# Patient Record
Sex: Female | Born: 1950 | Race: Black or African American | Hispanic: No | Marital: Single | Smoking: Former smoker
Health system: Southern US, Community
[De-identification: ages and names within clinical notes are randomized; demographics above are authoritative.]

## PROBLEM LIST (undated history)

## (undated) DIAGNOSIS — D638 Anemia in other chronic diseases classified elsewhere: Secondary | ICD-10-CM

## (undated) DIAGNOSIS — K5903 Drug induced constipation: Secondary | ICD-10-CM

## (undated) DIAGNOSIS — R748 Abnormal levels of other serum enzymes: Secondary | ICD-10-CM

## (undated) DIAGNOSIS — E785 Hyperlipidemia, unspecified: Secondary | ICD-10-CM

## (undated) DIAGNOSIS — T402X5A Adverse effect of other opioids, initial encounter: Principal | ICD-10-CM

## (undated) DIAGNOSIS — R011 Cardiac murmur, unspecified: Secondary | ICD-10-CM

## (undated) DIAGNOSIS — I5189 Other ill-defined heart diseases: Secondary | ICD-10-CM

## (undated) DIAGNOSIS — K649 Unspecified hemorrhoids: Secondary | ICD-10-CM

## (undated) DIAGNOSIS — Z87442 Personal history of urinary calculi: Secondary | ICD-10-CM

## (undated) DIAGNOSIS — M791 Myalgia, unspecified site: Secondary | ICD-10-CM

## (undated) DIAGNOSIS — I10 Essential (primary) hypertension: Secondary | ICD-10-CM

## (undated) DIAGNOSIS — K635 Polyp of colon: Secondary | ICD-10-CM

## (undated) DIAGNOSIS — K861 Other chronic pancreatitis: Secondary | ICD-10-CM

## (undated) DIAGNOSIS — R220 Localized swelling, mass and lump, head: Secondary | ICD-10-CM

## (undated) DIAGNOSIS — E1139 Type 2 diabetes mellitus with other diabetic ophthalmic complication: Secondary | ICD-10-CM

## (undated) DIAGNOSIS — I6529 Occlusion and stenosis of unspecified carotid artery: Secondary | ICD-10-CM

## (undated) DIAGNOSIS — K7460 Unspecified cirrhosis of liver: Secondary | ICD-10-CM

## (undated) DIAGNOSIS — E1149 Type 2 diabetes mellitus with other diabetic neurological complication: Secondary | ICD-10-CM

## (undated) DIAGNOSIS — J189 Pneumonia, unspecified organism: Secondary | ICD-10-CM

## (undated) DIAGNOSIS — J358 Other chronic diseases of tonsils and adenoids: Secondary | ICD-10-CM

## (undated) DIAGNOSIS — N189 Chronic kidney disease, unspecified: Secondary | ICD-10-CM

## (undated) DIAGNOSIS — I739 Peripheral vascular disease, unspecified: Secondary | ICD-10-CM

## (undated) DIAGNOSIS — I351 Nonrheumatic aortic (valve) insufficiency: Secondary | ICD-10-CM

## (undated) DIAGNOSIS — M1990 Unspecified osteoarthritis, unspecified site: Secondary | ICD-10-CM

## (undated) HISTORY — DX: Nonrheumatic aortic (valve) insufficiency: I35.1

## (undated) HISTORY — PX: MOUTH SURGERY: SHX715

## (undated) HISTORY — DX: Drug induced constipation: K59.03

## (undated) HISTORY — DX: Unspecified osteoarthritis, unspecified site: M19.90

## (undated) HISTORY — DX: Chronic kidney disease, unspecified: N18.9

## (undated) HISTORY — DX: Unspecified hemorrhoids: K64.9

## (undated) HISTORY — DX: Adverse effect of other opioids, initial encounter: T40.2X5A

## (undated) HISTORY — DX: Type 2 diabetes mellitus with other diabetic neurological complication: E11.49

## (undated) HISTORY — DX: Hyperlipidemia, unspecified: E78.5

## (undated) HISTORY — DX: Peripheral vascular disease, unspecified: I73.9

## (undated) HISTORY — DX: Occlusion and stenosis of unspecified carotid artery: I65.29

## (undated) HISTORY — DX: Type 2 diabetes mellitus with other diabetic ophthalmic complication: E11.39

## (undated) HISTORY — PX: COLONOSCOPY: SHX174

## (undated) HISTORY — PX: BREAST SURGERY: SHX581

## (undated) HISTORY — DX: Anemia in other chronic diseases classified elsewhere: D63.8

## (undated) HISTORY — DX: Myalgia, unspecified site: M79.10

## (undated) HISTORY — DX: Other chronic pancreatitis: K86.1

## (undated) HISTORY — DX: Polyp of colon: K63.5

## (undated) HISTORY — DX: Other ill-defined heart diseases: I51.89

## (undated) HISTORY — DX: Abnormal levels of other serum enzymes: R74.8

---

## 1999-06-24 ENCOUNTER — Emergency Department (HOSPITAL_COMMUNITY): Admission: EM | Admit: 1999-06-24 | Discharge: 1999-06-24 | Payer: Self-pay | Admitting: Emergency Medicine

## 1999-06-24 ENCOUNTER — Encounter: Payer: Self-pay | Admitting: Emergency Medicine

## 2000-01-17 ENCOUNTER — Inpatient Hospital Stay (HOSPITAL_COMMUNITY): Admission: EM | Admit: 2000-01-17 | Discharge: 2000-01-23 | Payer: Self-pay | Admitting: Emergency Medicine

## 2000-01-17 ENCOUNTER — Encounter: Payer: Self-pay | Admitting: Internal Medicine

## 2000-10-05 ENCOUNTER — Emergency Department (HOSPITAL_COMMUNITY): Admission: EM | Admit: 2000-10-05 | Discharge: 2000-10-05 | Payer: Self-pay | Admitting: Emergency Medicine

## 2000-10-14 ENCOUNTER — Encounter: Admission: RE | Admit: 2000-10-14 | Discharge: 2000-10-14 | Payer: Self-pay | Admitting: Internal Medicine

## 2000-12-03 ENCOUNTER — Encounter: Admission: RE | Admit: 2000-12-03 | Discharge: 2000-12-03 | Payer: Self-pay

## 2000-12-17 ENCOUNTER — Encounter: Admission: RE | Admit: 2000-12-17 | Discharge: 2000-12-17 | Payer: Self-pay | Admitting: Internal Medicine

## 2000-12-30 ENCOUNTER — Encounter: Admission: RE | Admit: 2000-12-30 | Discharge: 2000-12-30 | Payer: Self-pay | Admitting: Internal Medicine

## 2001-03-04 ENCOUNTER — Encounter: Admission: RE | Admit: 2001-03-04 | Discharge: 2001-03-04 | Payer: Self-pay | Admitting: *Deleted

## 2001-03-30 ENCOUNTER — Emergency Department (HOSPITAL_COMMUNITY): Admission: EM | Admit: 2001-03-30 | Discharge: 2001-03-30 | Payer: Self-pay | Admitting: Emergency Medicine

## 2001-03-30 ENCOUNTER — Encounter: Payer: Self-pay | Admitting: Emergency Medicine

## 2001-06-14 ENCOUNTER — Encounter: Admission: RE | Admit: 2001-06-14 | Discharge: 2001-06-14 | Payer: Self-pay | Admitting: Internal Medicine

## 2001-06-21 ENCOUNTER — Encounter: Admission: RE | Admit: 2001-06-21 | Discharge: 2001-06-21 | Payer: Self-pay

## 2001-10-03 ENCOUNTER — Encounter: Admission: RE | Admit: 2001-10-03 | Discharge: 2001-10-03 | Payer: Self-pay | Admitting: Internal Medicine

## 2001-10-10 ENCOUNTER — Encounter: Admission: RE | Admit: 2001-10-10 | Discharge: 2001-10-10 | Payer: Self-pay | Admitting: Internal Medicine

## 2001-10-18 ENCOUNTER — Encounter: Admission: RE | Admit: 2001-10-18 | Discharge: 2002-01-16 | Payer: Self-pay | Admitting: Internal Medicine

## 2001-11-28 ENCOUNTER — Encounter: Admission: RE | Admit: 2001-11-28 | Discharge: 2001-11-28 | Payer: Self-pay | Admitting: Internal Medicine

## 2001-12-12 ENCOUNTER — Encounter: Payer: Self-pay | Admitting: Internal Medicine

## 2001-12-12 ENCOUNTER — Ambulatory Visit (HOSPITAL_COMMUNITY): Admission: RE | Admit: 2001-12-12 | Discharge: 2001-12-12 | Payer: Self-pay | Admitting: Internal Medicine

## 2002-01-02 ENCOUNTER — Encounter: Admission: RE | Admit: 2002-01-02 | Discharge: 2002-01-02 | Payer: Self-pay | Admitting: Internal Medicine

## 2002-01-24 ENCOUNTER — Encounter: Payer: Self-pay | Admitting: Ophthalmology

## 2002-01-26 ENCOUNTER — Ambulatory Visit (HOSPITAL_COMMUNITY): Admission: RE | Admit: 2002-01-26 | Discharge: 2002-01-26 | Payer: Self-pay | Admitting: Ophthalmology

## 2002-03-09 ENCOUNTER — Encounter: Admission: RE | Admit: 2002-03-09 | Discharge: 2002-03-09 | Payer: Self-pay | Admitting: Internal Medicine

## 2002-04-03 ENCOUNTER — Ambulatory Visit (HOSPITAL_COMMUNITY): Admission: RE | Admit: 2002-04-03 | Discharge: 2002-04-03 | Payer: Self-pay | Admitting: Ophthalmology

## 2002-05-19 ENCOUNTER — Encounter: Payer: Self-pay | Admitting: Emergency Medicine

## 2002-05-19 ENCOUNTER — Inpatient Hospital Stay (HOSPITAL_COMMUNITY): Admission: EM | Admit: 2002-05-19 | Discharge: 2002-05-21 | Payer: Self-pay | Admitting: Emergency Medicine

## 2002-05-19 ENCOUNTER — Encounter: Admission: RE | Admit: 2002-05-19 | Discharge: 2002-05-19 | Payer: Self-pay | Admitting: Internal Medicine

## 2002-05-24 ENCOUNTER — Encounter: Admission: RE | Admit: 2002-05-24 | Discharge: 2002-05-24 | Payer: Self-pay | Admitting: Internal Medicine

## 2002-06-06 ENCOUNTER — Encounter (HOSPITAL_BASED_OUTPATIENT_CLINIC_OR_DEPARTMENT_OTHER): Admission: RE | Admit: 2002-06-06 | Discharge: 2002-08-22 | Payer: Self-pay | Admitting: Internal Medicine

## 2002-07-17 ENCOUNTER — Encounter: Admission: RE | Admit: 2002-07-17 | Discharge: 2002-07-17 | Payer: Self-pay | Admitting: Internal Medicine

## 2002-07-18 ENCOUNTER — Encounter: Admission: RE | Admit: 2002-07-18 | Discharge: 2002-07-18 | Payer: Self-pay | Admitting: Internal Medicine

## 2002-07-20 ENCOUNTER — Ambulatory Visit (HOSPITAL_COMMUNITY): Admission: RE | Admit: 2002-07-20 | Discharge: 2002-07-20 | Payer: Self-pay | Admitting: Internal Medicine

## 2002-08-01 ENCOUNTER — Encounter: Admission: RE | Admit: 2002-08-01 | Discharge: 2002-08-01 | Payer: Self-pay | Admitting: Internal Medicine

## 2002-08-10 ENCOUNTER — Ambulatory Visit (HOSPITAL_COMMUNITY): Admission: RE | Admit: 2002-08-10 | Discharge: 2002-08-10 | Payer: Self-pay | Admitting: Internal Medicine

## 2002-08-16 ENCOUNTER — Encounter: Admission: RE | Admit: 2002-08-16 | Discharge: 2002-08-16 | Payer: Self-pay | Admitting: Internal Medicine

## 2002-08-21 ENCOUNTER — Ambulatory Visit (HOSPITAL_COMMUNITY): Admission: RE | Admit: 2002-08-21 | Discharge: 2002-08-22 | Payer: Self-pay | Admitting: Ophthalmology

## 2002-09-28 ENCOUNTER — Encounter (HOSPITAL_BASED_OUTPATIENT_CLINIC_OR_DEPARTMENT_OTHER): Admission: RE | Admit: 2002-09-28 | Discharge: 2002-12-27 | Payer: Self-pay | Admitting: Internal Medicine

## 2002-10-06 ENCOUNTER — Encounter: Admission: RE | Admit: 2002-10-06 | Discharge: 2002-10-06 | Payer: Self-pay | Admitting: Internal Medicine

## 2003-01-04 ENCOUNTER — Encounter (HOSPITAL_BASED_OUTPATIENT_CLINIC_OR_DEPARTMENT_OTHER): Admission: RE | Admit: 2003-01-04 | Discharge: 2003-04-04 | Payer: Self-pay | Admitting: Internal Medicine

## 2003-01-12 ENCOUNTER — Emergency Department (HOSPITAL_COMMUNITY): Admission: EM | Admit: 2003-01-12 | Discharge: 2003-01-12 | Payer: Self-pay | Admitting: Emergency Medicine

## 2003-01-12 ENCOUNTER — Encounter: Admission: RE | Admit: 2003-01-12 | Discharge: 2003-01-12 | Payer: Self-pay | Admitting: Internal Medicine

## 2003-04-13 ENCOUNTER — Encounter (HOSPITAL_BASED_OUTPATIENT_CLINIC_OR_DEPARTMENT_OTHER): Admission: RE | Admit: 2003-04-13 | Discharge: 2003-07-12 | Payer: Self-pay | Admitting: Internal Medicine

## 2003-04-26 ENCOUNTER — Emergency Department (HOSPITAL_COMMUNITY): Admission: EM | Admit: 2003-04-26 | Discharge: 2003-04-27 | Payer: Self-pay | Admitting: Emergency Medicine

## 2003-05-28 ENCOUNTER — Encounter: Admission: RE | Admit: 2003-05-28 | Discharge: 2003-05-28 | Payer: Self-pay | Admitting: Internal Medicine

## 2003-08-10 ENCOUNTER — Encounter: Admission: RE | Admit: 2003-08-10 | Discharge: 2003-11-08 | Payer: Self-pay | Admitting: Internal Medicine

## 2003-08-29 ENCOUNTER — Encounter: Admission: RE | Admit: 2003-08-29 | Discharge: 2003-08-29 | Payer: Self-pay | Admitting: Internal Medicine

## 2003-09-12 ENCOUNTER — Encounter: Admission: RE | Admit: 2003-09-12 | Discharge: 2003-09-12 | Payer: Self-pay | Admitting: Internal Medicine

## 2003-10-19 ENCOUNTER — Ambulatory Visit (HOSPITAL_BASED_OUTPATIENT_CLINIC_OR_DEPARTMENT_OTHER): Admission: RE | Admit: 2003-10-19 | Discharge: 2003-10-19 | Payer: Self-pay | Admitting: Surgery

## 2003-10-19 ENCOUNTER — Ambulatory Visit (HOSPITAL_COMMUNITY): Admission: RE | Admit: 2003-10-19 | Discharge: 2003-10-19 | Payer: Self-pay | Admitting: Surgery

## 2003-10-19 ENCOUNTER — Encounter (INDEPENDENT_AMBULATORY_CARE_PROVIDER_SITE_OTHER): Payer: Self-pay | Admitting: *Deleted

## 2003-11-05 ENCOUNTER — Ambulatory Visit (HOSPITAL_COMMUNITY): Admission: RE | Admit: 2003-11-05 | Discharge: 2003-11-05 | Payer: Self-pay | Admitting: Internal Medicine

## 2003-11-05 ENCOUNTER — Ambulatory Visit: Payer: Self-pay | Admitting: Internal Medicine

## 2003-11-20 ENCOUNTER — Encounter (HOSPITAL_BASED_OUTPATIENT_CLINIC_OR_DEPARTMENT_OTHER): Admission: RE | Admit: 2003-11-20 | Discharge: 2004-02-15 | Payer: Self-pay | Admitting: Internal Medicine

## 2004-01-08 ENCOUNTER — Ambulatory Visit: Payer: Self-pay | Admitting: Internal Medicine

## 2004-01-31 ENCOUNTER — Ambulatory Visit: Payer: Self-pay | Admitting: Internal Medicine

## 2004-02-27 ENCOUNTER — Encounter (HOSPITAL_BASED_OUTPATIENT_CLINIC_OR_DEPARTMENT_OTHER): Admission: RE | Admit: 2004-02-27 | Discharge: 2004-05-20 | Payer: Self-pay | Admitting: Internal Medicine

## 2004-04-14 ENCOUNTER — Encounter (INDEPENDENT_AMBULATORY_CARE_PROVIDER_SITE_OTHER): Payer: Self-pay | Admitting: Internal Medicine

## 2004-05-19 ENCOUNTER — Ambulatory Visit: Payer: Self-pay | Admitting: Internal Medicine

## 2004-05-21 ENCOUNTER — Ambulatory Visit: Payer: Self-pay | Admitting: Internal Medicine

## 2004-05-28 ENCOUNTER — Encounter (HOSPITAL_BASED_OUTPATIENT_CLINIC_OR_DEPARTMENT_OTHER): Admission: RE | Admit: 2004-05-28 | Discharge: 2004-08-20 | Payer: Self-pay | Admitting: Surgery

## 2004-06-04 ENCOUNTER — Ambulatory Visit: Payer: Self-pay | Admitting: Internal Medicine

## 2004-08-06 ENCOUNTER — Ambulatory Visit: Payer: Self-pay | Admitting: Internal Medicine

## 2004-08-08 ENCOUNTER — Ambulatory Visit: Payer: Self-pay | Admitting: Internal Medicine

## 2004-08-14 ENCOUNTER — Ambulatory Visit: Payer: Self-pay | Admitting: Internal Medicine

## 2004-08-22 ENCOUNTER — Encounter (HOSPITAL_BASED_OUTPATIENT_CLINIC_OR_DEPARTMENT_OTHER): Admission: RE | Admit: 2004-08-22 | Discharge: 2004-11-20 | Payer: Self-pay | Admitting: Surgery

## 2004-08-27 ENCOUNTER — Ambulatory Visit: Admission: RE | Admit: 2004-08-27 | Discharge: 2004-08-27 | Payer: Self-pay | Admitting: Internal Medicine

## 2004-08-27 ENCOUNTER — Encounter (INDEPENDENT_AMBULATORY_CARE_PROVIDER_SITE_OTHER): Payer: Self-pay | Admitting: Internal Medicine

## 2004-08-27 ENCOUNTER — Ambulatory Visit: Payer: Self-pay | Admitting: Internal Medicine

## 2004-08-27 LAB — CONVERTED CEMR LAB: Pap Smear: NORMAL

## 2004-09-03 ENCOUNTER — Ambulatory Visit (HOSPITAL_COMMUNITY): Admission: RE | Admit: 2004-09-03 | Discharge: 2004-09-03 | Payer: Self-pay | Admitting: Internal Medicine

## 2004-09-03 ENCOUNTER — Encounter: Payer: Self-pay | Admitting: Cardiology

## 2004-09-03 ENCOUNTER — Ambulatory Visit: Payer: Self-pay | Admitting: Internal Medicine

## 2004-09-03 ENCOUNTER — Ambulatory Visit: Payer: Self-pay | Admitting: Cardiology

## 2004-09-11 ENCOUNTER — Ambulatory Visit: Payer: Self-pay | Admitting: Internal Medicine

## 2004-09-18 ENCOUNTER — Ambulatory Visit (HOSPITAL_COMMUNITY): Admission: RE | Admit: 2004-09-18 | Discharge: 2004-09-18 | Payer: Self-pay | Admitting: Internal Medicine

## 2004-09-25 ENCOUNTER — Ambulatory Visit: Payer: Self-pay | Admitting: Internal Medicine

## 2004-10-14 ENCOUNTER — Ambulatory Visit: Payer: Self-pay | Admitting: Internal Medicine

## 2004-11-25 ENCOUNTER — Ambulatory Visit: Payer: Self-pay | Admitting: Hospitalist

## 2004-12-12 ENCOUNTER — Encounter (INDEPENDENT_AMBULATORY_CARE_PROVIDER_SITE_OTHER): Payer: Self-pay | Admitting: Specialist

## 2004-12-12 ENCOUNTER — Ambulatory Visit (HOSPITAL_COMMUNITY): Admission: RE | Admit: 2004-12-12 | Discharge: 2004-12-12 | Payer: Self-pay | Admitting: Surgery

## 2004-12-13 ENCOUNTER — Emergency Department (HOSPITAL_COMMUNITY): Admission: EM | Admit: 2004-12-13 | Discharge: 2004-12-13 | Payer: Self-pay | Admitting: *Deleted

## 2004-12-17 ENCOUNTER — Encounter: Admission: RE | Admit: 2004-12-17 | Discharge: 2004-12-17 | Payer: Self-pay | Admitting: Ophthalmology

## 2005-01-24 ENCOUNTER — Emergency Department (HOSPITAL_COMMUNITY): Admission: EM | Admit: 2005-01-24 | Discharge: 2005-01-25 | Payer: Self-pay | Admitting: Emergency Medicine

## 2005-03-25 ENCOUNTER — Ambulatory Visit: Payer: Self-pay | Admitting: Internal Medicine

## 2005-04-27 ENCOUNTER — Ambulatory Visit: Payer: Self-pay | Admitting: Internal Medicine

## 2005-04-30 ENCOUNTER — Ambulatory Visit: Payer: Self-pay | Admitting: Internal Medicine

## 2005-05-04 ENCOUNTER — Ambulatory Visit: Payer: Self-pay | Admitting: Internal Medicine

## 2005-05-04 ENCOUNTER — Ambulatory Visit (HOSPITAL_COMMUNITY): Admission: RE | Admit: 2005-05-04 | Discharge: 2005-05-04 | Payer: Self-pay | Admitting: Internal Medicine

## 2005-05-08 ENCOUNTER — Ambulatory Visit (HOSPITAL_COMMUNITY): Admission: RE | Admit: 2005-05-08 | Discharge: 2005-05-08 | Payer: Self-pay | Admitting: Internal Medicine

## 2005-06-22 ENCOUNTER — Encounter: Admission: RE | Admit: 2005-06-22 | Discharge: 2005-06-22 | Payer: Self-pay | Admitting: Internal Medicine

## 2005-07-02 ENCOUNTER — Ambulatory Visit: Payer: Self-pay | Admitting: Internal Medicine

## 2005-07-02 ENCOUNTER — Ambulatory Visit (HOSPITAL_COMMUNITY): Admission: RE | Admit: 2005-07-02 | Discharge: 2005-07-02 | Payer: Self-pay | Admitting: Internal Medicine

## 2005-07-06 ENCOUNTER — Ambulatory Visit: Payer: Self-pay | Admitting: Internal Medicine

## 2005-07-07 ENCOUNTER — Ambulatory Visit (HOSPITAL_COMMUNITY): Admission: RE | Admit: 2005-07-07 | Discharge: 2005-07-07 | Payer: Self-pay | Admitting: Internal Medicine

## 2005-07-09 ENCOUNTER — Ambulatory Visit: Payer: Self-pay | Admitting: Internal Medicine

## 2005-07-16 ENCOUNTER — Ambulatory Visit: Payer: Self-pay | Admitting: Internal Medicine

## 2005-07-23 ENCOUNTER — Encounter (INDEPENDENT_AMBULATORY_CARE_PROVIDER_SITE_OTHER): Payer: Self-pay | Admitting: *Deleted

## 2005-07-23 ENCOUNTER — Ambulatory Visit (HOSPITAL_COMMUNITY): Admission: RE | Admit: 2005-07-23 | Discharge: 2005-07-23 | Payer: Self-pay | Admitting: Internal Medicine

## 2005-07-23 ENCOUNTER — Ambulatory Visit: Payer: Self-pay | Admitting: Internal Medicine

## 2005-08-12 ENCOUNTER — Ambulatory Visit: Payer: Self-pay | Admitting: Internal Medicine

## 2005-08-12 ENCOUNTER — Encounter (INDEPENDENT_AMBULATORY_CARE_PROVIDER_SITE_OTHER): Payer: Self-pay | Admitting: *Deleted

## 2005-08-13 ENCOUNTER — Ambulatory Visit: Payer: Self-pay | Admitting: Internal Medicine

## 2005-08-17 ENCOUNTER — Ambulatory Visit: Payer: Self-pay | Admitting: Internal Medicine

## 2005-08-20 ENCOUNTER — Ambulatory Visit: Payer: Self-pay | Admitting: Cardiology

## 2005-09-07 ENCOUNTER — Ambulatory Visit: Payer: Self-pay | Admitting: Internal Medicine

## 2005-09-07 ENCOUNTER — Encounter (INDEPENDENT_AMBULATORY_CARE_PROVIDER_SITE_OTHER): Payer: Self-pay | Admitting: *Deleted

## 2005-09-09 ENCOUNTER — Ambulatory Visit: Payer: Self-pay | Admitting: Internal Medicine

## 2005-09-09 ENCOUNTER — Ambulatory Visit (HOSPITAL_COMMUNITY): Admission: RE | Admit: 2005-09-09 | Discharge: 2005-09-09 | Payer: Self-pay | Admitting: Internal Medicine

## 2005-10-01 ENCOUNTER — Ambulatory Visit: Payer: Self-pay | Admitting: Internal Medicine

## 2005-10-12 ENCOUNTER — Ambulatory Visit: Payer: Self-pay | Admitting: Internal Medicine

## 2005-10-13 ENCOUNTER — Ambulatory Visit (HOSPITAL_COMMUNITY): Admission: RE | Admit: 2005-10-13 | Discharge: 2005-10-13 | Payer: Self-pay | Admitting: Internal Medicine

## 2005-12-28 ENCOUNTER — Encounter (INDEPENDENT_AMBULATORY_CARE_PROVIDER_SITE_OTHER): Payer: Self-pay | Admitting: Internal Medicine

## 2005-12-28 ENCOUNTER — Ambulatory Visit: Payer: Self-pay | Admitting: Hospitalist

## 2005-12-28 LAB — CONVERTED CEMR LAB
ALT: 102 units/L — ABNORMAL HIGH (ref 0–35)
AST: 72 units/L — ABNORMAL HIGH (ref 0–37)
Albumin: 3.7 g/dL (ref 3.5–5.2)
Alkaline Phosphatase: 164 units/L — ABNORMAL HIGH (ref 39–117)
BUN: 35 mg/dL — ABNORMAL HIGH (ref 6–23)
Calcium: 8.2 mg/dL — ABNORMAL LOW (ref 8.4–10.5)
Phosphorus: 4.3 mg/dL (ref 2.3–4.6)
Total Bilirubin: 0.2 mg/dL — ABNORMAL LOW (ref 0.3–1.2)

## 2006-02-22 ENCOUNTER — Encounter: Payer: Self-pay | Admitting: Vascular Surgery

## 2006-02-22 ENCOUNTER — Inpatient Hospital Stay (HOSPITAL_COMMUNITY): Admission: EM | Admit: 2006-02-22 | Discharge: 2006-02-24 | Payer: Self-pay | Admitting: Emergency Medicine

## 2006-02-22 ENCOUNTER — Ambulatory Visit: Payer: Self-pay | Admitting: *Deleted

## 2006-02-24 ENCOUNTER — Encounter: Payer: Self-pay | Admitting: Vascular Surgery

## 2006-03-04 ENCOUNTER — Inpatient Hospital Stay (HOSPITAL_COMMUNITY): Admission: AD | Admit: 2006-03-04 | Discharge: 2006-03-09 | Payer: Self-pay | Admitting: *Deleted

## 2006-03-04 ENCOUNTER — Ambulatory Visit: Payer: Self-pay | Admitting: Internal Medicine

## 2006-03-04 ENCOUNTER — Encounter: Admission: RE | Admit: 2006-03-04 | Discharge: 2006-03-04 | Payer: Self-pay | Admitting: Internal Medicine

## 2006-03-04 ENCOUNTER — Ambulatory Visit: Payer: Self-pay | Admitting: *Deleted

## 2006-03-04 ENCOUNTER — Ambulatory Visit (HOSPITAL_COMMUNITY): Admission: RE | Admit: 2006-03-04 | Discharge: 2006-03-04 | Payer: Self-pay | Admitting: Internal Medicine

## 2006-03-05 ENCOUNTER — Encounter: Payer: Self-pay | Admitting: Vascular Surgery

## 2006-03-09 DIAGNOSIS — D6859 Other primary thrombophilia: Secondary | ICD-10-CM | POA: Insufficient documentation

## 2006-03-12 ENCOUNTER — Encounter (INDEPENDENT_AMBULATORY_CARE_PROVIDER_SITE_OTHER): Payer: Self-pay | Admitting: Internal Medicine

## 2006-03-15 ENCOUNTER — Encounter (INDEPENDENT_AMBULATORY_CARE_PROVIDER_SITE_OTHER): Payer: Self-pay | Admitting: Internal Medicine

## 2006-03-15 DIAGNOSIS — D638 Anemia in other chronic diseases classified elsewhere: Secondary | ICD-10-CM

## 2006-03-15 DIAGNOSIS — E1139 Type 2 diabetes mellitus with other diabetic ophthalmic complication: Secondary | ICD-10-CM

## 2006-03-15 DIAGNOSIS — E1165 Type 2 diabetes mellitus with hyperglycemia: Secondary | ICD-10-CM

## 2006-03-15 DIAGNOSIS — N183 Chronic kidney disease, stage 3 (moderate): Secondary | ICD-10-CM

## 2006-03-15 DIAGNOSIS — E1122 Type 2 diabetes mellitus with diabetic chronic kidney disease: Secondary | ICD-10-CM | POA: Insufficient documentation

## 2006-03-15 DIAGNOSIS — I517 Cardiomegaly: Secondary | ICD-10-CM | POA: Insufficient documentation

## 2006-03-15 DIAGNOSIS — E1140 Type 2 diabetes mellitus with diabetic neuropathy, unspecified: Secondary | ICD-10-CM | POA: Insufficient documentation

## 2006-03-15 DIAGNOSIS — N189 Chronic kidney disease, unspecified: Secondary | ICD-10-CM

## 2006-03-15 DIAGNOSIS — E1149 Type 2 diabetes mellitus with other diabetic neurological complication: Secondary | ICD-10-CM

## 2006-03-15 DIAGNOSIS — I10 Essential (primary) hypertension: Secondary | ICD-10-CM | POA: Insufficient documentation

## 2006-03-15 HISTORY — DX: Anemia in other chronic diseases classified elsewhere: D63.8

## 2006-03-15 HISTORY — DX: Chronic kidney disease, unspecified: N18.9

## 2006-03-15 HISTORY — DX: Type 2 diabetes mellitus with other diabetic ophthalmic complication: E11.39

## 2006-03-15 HISTORY — DX: Type 2 diabetes mellitus with other diabetic neurological complication: E11.49

## 2006-03-23 ENCOUNTER — Telehealth (INDEPENDENT_AMBULATORY_CARE_PROVIDER_SITE_OTHER): Payer: Self-pay | Admitting: Internal Medicine

## 2006-03-29 ENCOUNTER — Ambulatory Visit: Payer: Self-pay | Admitting: Internal Medicine

## 2006-03-29 DIAGNOSIS — K861 Other chronic pancreatitis: Secondary | ICD-10-CM

## 2006-03-29 HISTORY — DX: Other chronic pancreatitis: K86.1

## 2006-04-06 ENCOUNTER — Encounter (INDEPENDENT_AMBULATORY_CARE_PROVIDER_SITE_OTHER): Payer: Self-pay | Admitting: Internal Medicine

## 2006-04-09 ENCOUNTER — Telehealth (INDEPENDENT_AMBULATORY_CARE_PROVIDER_SITE_OTHER): Payer: Self-pay | Admitting: Internal Medicine

## 2006-04-22 ENCOUNTER — Telehealth: Payer: Self-pay | Admitting: *Deleted

## 2006-04-26 ENCOUNTER — Encounter (INDEPENDENT_AMBULATORY_CARE_PROVIDER_SITE_OTHER): Payer: Self-pay | Admitting: Internal Medicine

## 2006-04-28 ENCOUNTER — Encounter (INDEPENDENT_AMBULATORY_CARE_PROVIDER_SITE_OTHER): Payer: Self-pay | Admitting: Pulmonary Disease

## 2006-06-29 ENCOUNTER — Encounter (INDEPENDENT_AMBULATORY_CARE_PROVIDER_SITE_OTHER): Payer: Self-pay | Admitting: *Deleted

## 2006-06-29 ENCOUNTER — Ambulatory Visit: Payer: Self-pay | Admitting: Internal Medicine

## 2006-06-29 DIAGNOSIS — Z8739 Personal history of other diseases of the musculoskeletal system and connective tissue: Secondary | ICD-10-CM | POA: Insufficient documentation

## 2006-06-29 LAB — CONVERTED CEMR LAB
CO2: 21 meq/L (ref 19–32)
Chloride: 113 meq/L — ABNORMAL HIGH (ref 96–112)
Glucose, Bld: 55 mg/dL — ABNORMAL LOW (ref 70–99)
Sodium: 140 meq/L (ref 135–145)

## 2006-07-20 ENCOUNTER — Telehealth: Payer: Self-pay | Admitting: *Deleted

## 2006-08-06 ENCOUNTER — Telehealth (INDEPENDENT_AMBULATORY_CARE_PROVIDER_SITE_OTHER): Payer: Self-pay | Admitting: Internal Medicine

## 2006-08-06 ENCOUNTER — Telehealth: Payer: Self-pay | Admitting: *Deleted

## 2006-08-25 ENCOUNTER — Telehealth: Payer: Self-pay | Admitting: *Deleted

## 2006-08-26 ENCOUNTER — Ambulatory Visit: Payer: Self-pay | Admitting: Internal Medicine

## 2006-08-26 LAB — CONVERTED CEMR LAB: Hgb A1c MFr Bld: 8 %

## 2006-09-27 ENCOUNTER — Ambulatory Visit (HOSPITAL_COMMUNITY): Admission: RE | Admit: 2006-09-27 | Discharge: 2006-09-27 | Payer: Self-pay | Admitting: Internal Medicine

## 2006-09-27 ENCOUNTER — Ambulatory Visit: Payer: Self-pay | Admitting: Internal Medicine

## 2006-09-27 ENCOUNTER — Telehealth (INDEPENDENT_AMBULATORY_CARE_PROVIDER_SITE_OTHER): Payer: Self-pay | Admitting: *Deleted

## 2006-09-27 DIAGNOSIS — Z87891 Personal history of nicotine dependence: Secondary | ICD-10-CM | POA: Insufficient documentation

## 2006-09-28 ENCOUNTER — Telehealth (INDEPENDENT_AMBULATORY_CARE_PROVIDER_SITE_OTHER): Payer: Self-pay | Admitting: Internal Medicine

## 2006-09-28 ENCOUNTER — Telehealth: Payer: Self-pay | Admitting: *Deleted

## 2006-09-30 ENCOUNTER — Encounter (INDEPENDENT_AMBULATORY_CARE_PROVIDER_SITE_OTHER): Payer: Self-pay | Admitting: *Deleted

## 2006-09-30 ENCOUNTER — Encounter: Payer: Self-pay | Admitting: Internal Medicine

## 2006-09-30 ENCOUNTER — Ambulatory Visit: Payer: Self-pay | Admitting: Infectious Disease

## 2006-09-30 LAB — CONVERTED CEMR LAB: Blood Glucose, Fingerstick: 198

## 2006-10-22 ENCOUNTER — Telehealth: Payer: Self-pay | Admitting: *Deleted

## 2006-10-27 ENCOUNTER — Telehealth: Payer: Self-pay | Admitting: *Deleted

## 2006-11-23 ENCOUNTER — Telehealth (INDEPENDENT_AMBULATORY_CARE_PROVIDER_SITE_OTHER): Payer: Self-pay | Admitting: *Deleted

## 2006-12-23 ENCOUNTER — Telehealth: Payer: Self-pay | Admitting: *Deleted

## 2007-01-25 ENCOUNTER — Telehealth: Payer: Self-pay | Admitting: Internal Medicine

## 2007-02-07 ENCOUNTER — Telehealth (INDEPENDENT_AMBULATORY_CARE_PROVIDER_SITE_OTHER): Payer: Self-pay | Admitting: *Deleted

## 2007-03-21 ENCOUNTER — Telehealth: Payer: Self-pay | Admitting: Internal Medicine

## 2007-04-05 ENCOUNTER — Ambulatory Visit: Payer: Self-pay | Admitting: Internal Medicine

## 2007-04-05 ENCOUNTER — Encounter: Payer: Self-pay | Admitting: Internal Medicine

## 2007-04-08 LAB — CONVERTED CEMR LAB
AST: 39 units/L — ABNORMAL HIGH (ref 0–37)
Albumin: 4 g/dL (ref 3.5–5.2)
Alkaline Phosphatase: 116 units/L (ref 39–117)
Bilirubin Urine: NEGATIVE
Calcium: 8.9 mg/dL (ref 8.4–10.5)
Cholesterol: 125 mg/dL (ref 0–200)
Creatinine, Urine: 27.6 mg/dL
Microalb Creat Ratio: 121 mg/g — ABNORMAL HIGH (ref 0.0–30.0)
Protein, ur: NEGATIVE mg/dL
Sodium: 141 meq/L (ref 135–145)
Urine Glucose: NEGATIVE mg/dL
VLDL: 13 mg/dL (ref 0–40)

## 2007-04-15 ENCOUNTER — Telehealth: Payer: Self-pay | Admitting: *Deleted

## 2007-04-28 ENCOUNTER — Telehealth: Payer: Self-pay | Admitting: Internal Medicine

## 2007-05-06 ENCOUNTER — Ambulatory Visit: Payer: Self-pay | Admitting: *Deleted

## 2007-05-06 ENCOUNTER — Encounter: Payer: Self-pay | Admitting: Internal Medicine

## 2007-05-06 DIAGNOSIS — M1990 Unspecified osteoarthritis, unspecified site: Secondary | ICD-10-CM | POA: Insufficient documentation

## 2007-05-06 HISTORY — DX: Unspecified osteoarthritis, unspecified site: M19.90

## 2007-05-06 LAB — CONVERTED CEMR LAB
Blood Glucose, Fingerstick: 115
Blood Glucose, Fingerstick: 115
Hgb A1c MFr Bld: 8.2 %

## 2007-05-26 ENCOUNTER — Encounter: Payer: Self-pay | Admitting: Internal Medicine

## 2007-07-23 ENCOUNTER — Emergency Department (HOSPITAL_COMMUNITY): Admission: EM | Admit: 2007-07-23 | Discharge: 2007-07-23 | Payer: Self-pay | Admitting: Emergency Medicine

## 2007-07-25 ENCOUNTER — Emergency Department (HOSPITAL_COMMUNITY): Admission: EM | Admit: 2007-07-25 | Discharge: 2007-07-25 | Payer: Self-pay | Admitting: Emergency Medicine

## 2007-08-08 ENCOUNTER — Emergency Department (HOSPITAL_COMMUNITY): Admission: EM | Admit: 2007-08-08 | Discharge: 2007-08-08 | Payer: Self-pay | Admitting: Family Medicine

## 2007-08-18 ENCOUNTER — Ambulatory Visit: Payer: Self-pay | Admitting: Internal Medicine

## 2007-08-18 LAB — CONVERTED CEMR LAB
Blood Glucose, Fingerstick: 107
Hgb A1c MFr Bld: 8.2 %

## 2007-09-01 ENCOUNTER — Telehealth: Payer: Self-pay | Admitting: Internal Medicine

## 2007-09-22 ENCOUNTER — Telehealth: Payer: Self-pay | Admitting: Internal Medicine

## 2007-09-29 ENCOUNTER — Telehealth: Payer: Self-pay | Admitting: Infectious Diseases

## 2007-10-13 ENCOUNTER — Encounter: Payer: Self-pay | Admitting: Internal Medicine

## 2007-11-14 ENCOUNTER — Encounter (INDEPENDENT_AMBULATORY_CARE_PROVIDER_SITE_OTHER): Payer: Self-pay | Admitting: Internal Medicine

## 2007-12-13 ENCOUNTER — Encounter: Payer: Self-pay | Admitting: Internal Medicine

## 2007-12-26 ENCOUNTER — Telehealth: Payer: Self-pay | Admitting: Internal Medicine

## 2008-01-25 ENCOUNTER — Telehealth: Payer: Self-pay | Admitting: Internal Medicine

## 2008-02-07 ENCOUNTER — Ambulatory Visit: Payer: Self-pay | Admitting: Internal Medicine

## 2008-02-07 ENCOUNTER — Encounter (INDEPENDENT_AMBULATORY_CARE_PROVIDER_SITE_OTHER): Payer: Self-pay | Admitting: Internal Medicine

## 2008-02-08 ENCOUNTER — Telehealth: Payer: Self-pay | Admitting: Internal Medicine

## 2008-02-13 ENCOUNTER — Telehealth: Payer: Self-pay | Admitting: Internal Medicine

## 2008-02-13 LAB — CONVERTED CEMR LAB
AST: 46 units/L — ABNORMAL HIGH (ref 0–37)
Alkaline Phosphatase: 82 units/L (ref 39–117)
BUN: 36 mg/dL — ABNORMAL HIGH (ref 6–23)
Basophils Absolute: 0 10*3/uL (ref 0.0–0.1)
Chloride: 111 meq/L (ref 96–112)
Creatinine, Ser: 1.35 mg/dL — ABNORMAL HIGH (ref 0.40–1.20)
Eosinophils Absolute: 0.2 10*3/uL (ref 0.0–0.7)
Eosinophils Relative: 4 % (ref 0–5)
HCT: 32.3 % — ABNORMAL LOW (ref 36.0–46.0)
Hemoglobin: 10.5 g/dL — ABNORMAL LOW (ref 12.0–15.0)
MCHC: 32.5 g/dL (ref 30.0–36.0)
MCV: 91.8 fL (ref 78.0–100.0)
Neutro Abs: 1.7 10*3/uL (ref 1.7–7.7)
Neutrophils Relative %: 41 % — ABNORMAL LOW (ref 43–77)
Platelets: 136 10*3/uL — ABNORMAL LOW (ref 150–400)
RBC: 3.52 M/uL — ABNORMAL LOW (ref 3.87–5.11)
TSH: 1.142 microintl units/mL (ref 0.350–4.50)
WBC: 4.2 10*3/uL (ref 4.0–10.5)

## 2008-02-28 ENCOUNTER — Encounter: Admission: RE | Admit: 2008-02-28 | Discharge: 2008-02-28 | Payer: Self-pay | Admitting: Internal Medicine

## 2008-03-13 ENCOUNTER — Encounter (INDEPENDENT_AMBULATORY_CARE_PROVIDER_SITE_OTHER): Payer: Self-pay | Admitting: Internal Medicine

## 2008-03-13 ENCOUNTER — Ambulatory Visit: Payer: Self-pay | Admitting: Infectious Disease

## 2008-03-28 ENCOUNTER — Encounter (INDEPENDENT_AMBULATORY_CARE_PROVIDER_SITE_OTHER): Payer: Self-pay | Admitting: Internal Medicine

## 2008-04-17 ENCOUNTER — Encounter (INDEPENDENT_AMBULATORY_CARE_PROVIDER_SITE_OTHER): Payer: Self-pay | Admitting: Internal Medicine

## 2008-05-22 ENCOUNTER — Encounter (INDEPENDENT_AMBULATORY_CARE_PROVIDER_SITE_OTHER): Payer: Self-pay | Admitting: Internal Medicine

## 2008-05-22 ENCOUNTER — Ambulatory Visit: Payer: Self-pay | Admitting: Internal Medicine

## 2008-05-22 LAB — CONVERTED CEMR LAB: RBC Folate: 564 ng/mL (ref 180–600)

## 2008-05-28 LAB — CONVERTED CEMR LAB
AST: 134 units/L — ABNORMAL HIGH (ref 0–37)
Alkaline Phosphatase: 91 units/L (ref 39–117)
Calcium: 8.3 mg/dL — ABNORMAL LOW (ref 8.4–10.5)
Creatinine, Ser: 1.48 mg/dL — ABNORMAL HIGH (ref 0.40–1.20)
Glucose, Bld: 241 mg/dL — ABNORMAL HIGH (ref 70–99)
Sodium: 139 meq/L (ref 135–145)
Total CHOL/HDL Ratio: 2.2
Total Protein: 6.6 g/dL (ref 6.0–8.3)
VLDL: 29 mg/dL (ref 0–40)
Vitamin B-12: 433 pg/mL (ref 211–911)

## 2008-06-08 DIAGNOSIS — K649 Unspecified hemorrhoids: Secondary | ICD-10-CM | POA: Insufficient documentation

## 2008-06-18 ENCOUNTER — Ambulatory Visit: Payer: Self-pay | Admitting: Internal Medicine

## 2008-06-26 ENCOUNTER — Telehealth: Payer: Self-pay | Admitting: Infectious Disease

## 2008-07-06 ENCOUNTER — Telehealth: Payer: Self-pay | Admitting: Internal Medicine

## 2008-07-13 ENCOUNTER — Telehealth (INDEPENDENT_AMBULATORY_CARE_PROVIDER_SITE_OTHER): Payer: Self-pay | Admitting: Internal Medicine

## 2008-07-14 ENCOUNTER — Emergency Department (HOSPITAL_COMMUNITY): Admission: EM | Admit: 2008-07-14 | Discharge: 2008-07-14 | Payer: Self-pay | Admitting: Emergency Medicine

## 2008-07-16 ENCOUNTER — Ambulatory Visit: Payer: Self-pay | Admitting: *Deleted

## 2008-07-16 ENCOUNTER — Telehealth: Payer: Self-pay | Admitting: *Deleted

## 2008-07-16 ENCOUNTER — Encounter (INDEPENDENT_AMBULATORY_CARE_PROVIDER_SITE_OTHER): Payer: Self-pay | Admitting: Internal Medicine

## 2008-07-16 LAB — CONVERTED CEMR LAB
Blood Glucose, Fingerstick: 200
Creatinine, Urine: 90.1 mg/dL

## 2008-07-17 LAB — CONVERTED CEMR LAB
Albumin: 3.8 g/dL (ref 3.5–5.2)
BUN: 46 mg/dL — ABNORMAL HIGH (ref 6–23)
CO2: 19 meq/L (ref 19–32)
Chloride: 108 meq/L (ref 96–112)
Creatinine, Ser: 1.23 mg/dL — ABNORMAL HIGH (ref 0.40–1.20)
GFR calc Af Amer: 55 mL/min — ABNORMAL LOW (ref >60)
GFR calc non Af Amer: 45 mL/min — ABNORMAL LOW (ref >60)
Lipase: <10 units/L (ref 0–75)
Potassium: 5 meq/L (ref 3.5–5.3)
Sodium: 138 meq/L (ref 135–145)
Total Bilirubin: 0.3 mg/dL (ref 0.3–1.2)
Total Protein: 6.9 g/dL (ref 6.0–8.3)

## 2008-09-25 ENCOUNTER — Ambulatory Visit: Payer: Self-pay | Admitting: Internal Medicine

## 2008-09-25 ENCOUNTER — Encounter (INDEPENDENT_AMBULATORY_CARE_PROVIDER_SITE_OTHER): Payer: Self-pay | Admitting: Internal Medicine

## 2008-09-25 DIAGNOSIS — I351 Nonrheumatic aortic (valve) insufficiency: Secondary | ICD-10-CM | POA: Insufficient documentation

## 2008-09-25 HISTORY — DX: Nonrheumatic aortic (valve) insufficiency: I35.1

## 2008-09-25 LAB — CONVERTED CEMR LAB
Barbiturate Quant, Ur: NEGATIVE
Benzodiazepines.: NEGATIVE
Blood Glucose, Fingerstick: 57
Methadone: NEGATIVE
Opiates: NEGATIVE
Phencyclidine (PCP): NEGATIVE
Propoxyphene: NEGATIVE

## 2008-10-11 ENCOUNTER — Encounter (INDEPENDENT_AMBULATORY_CARE_PROVIDER_SITE_OTHER): Payer: Self-pay | Admitting: Internal Medicine

## 2008-10-11 ENCOUNTER — Ambulatory Visit (HOSPITAL_COMMUNITY): Admission: RE | Admit: 2008-10-11 | Discharge: 2008-10-11 | Payer: Self-pay | Admitting: Internal Medicine

## 2008-11-08 ENCOUNTER — Telehealth: Payer: Self-pay | Admitting: *Deleted

## 2008-11-12 ENCOUNTER — Ambulatory Visit: Payer: Self-pay | Admitting: Internal Medicine

## 2008-11-21 ENCOUNTER — Ambulatory Visit: Payer: Self-pay | Admitting: Internal Medicine

## 2008-11-21 ENCOUNTER — Encounter: Payer: Self-pay | Admitting: Internal Medicine

## 2008-11-21 ENCOUNTER — Ambulatory Visit (HOSPITAL_COMMUNITY): Admission: RE | Admit: 2008-11-21 | Discharge: 2008-11-21 | Payer: Self-pay | Admitting: Internal Medicine

## 2008-11-21 ENCOUNTER — Ambulatory Visit: Payer: Self-pay | Admitting: Cardiology

## 2008-11-22 LAB — CONVERTED CEMR LAB
Albumin: 3.9 g/dL (ref 3.5–5.2)
Calcium: 8.7 mg/dL (ref 8.4–10.5)
Creatinine, Ser: 1.14 mg/dL (ref 0.40–1.20)
Sodium: 139 meq/L (ref 135–145)
Total Protein: 7.1 g/dL (ref 6.0–8.3)

## 2008-11-30 ENCOUNTER — Encounter (INDEPENDENT_AMBULATORY_CARE_PROVIDER_SITE_OTHER): Payer: Self-pay | Admitting: Internal Medicine

## 2008-12-06 ENCOUNTER — Encounter: Admission: RE | Admit: 2008-12-06 | Discharge: 2008-12-24 | Payer: Self-pay | Admitting: Internal Medicine

## 2008-12-18 ENCOUNTER — Encounter (INDEPENDENT_AMBULATORY_CARE_PROVIDER_SITE_OTHER): Payer: Self-pay | Admitting: Internal Medicine

## 2008-12-31 ENCOUNTER — Encounter (INDEPENDENT_AMBULATORY_CARE_PROVIDER_SITE_OTHER): Payer: Self-pay | Admitting: Internal Medicine

## 2009-01-08 ENCOUNTER — Encounter (INDEPENDENT_AMBULATORY_CARE_PROVIDER_SITE_OTHER): Payer: Self-pay | Admitting: Internal Medicine

## 2009-03-15 ENCOUNTER — Ambulatory Visit: Payer: Self-pay | Admitting: Internal Medicine

## 2009-04-05 ENCOUNTER — Telehealth: Payer: Self-pay | Admitting: Internal Medicine

## 2009-04-25 ENCOUNTER — Telehealth (INDEPENDENT_AMBULATORY_CARE_PROVIDER_SITE_OTHER): Payer: Self-pay | Admitting: Internal Medicine

## 2009-07-07 ENCOUNTER — Encounter: Payer: Self-pay | Admitting: Internal Medicine

## 2009-07-12 ENCOUNTER — Ambulatory Visit: Payer: Self-pay | Admitting: Infectious Disease

## 2009-07-12 LAB — CONVERTED CEMR LAB: Blood Glucose, Fingerstick: 160

## 2009-07-15 ENCOUNTER — Telehealth (INDEPENDENT_AMBULATORY_CARE_PROVIDER_SITE_OTHER): Payer: Self-pay | Admitting: Internal Medicine

## 2009-07-16 ENCOUNTER — Telehealth (INDEPENDENT_AMBULATORY_CARE_PROVIDER_SITE_OTHER): Payer: Self-pay | Admitting: *Deleted

## 2009-07-16 LAB — CONVERTED CEMR LAB
BUN: 46 mg/dL — ABNORMAL HIGH (ref 6–23)
Calcium: 8.7 mg/dL (ref 8.4–10.5)
Sodium: 134 meq/L — ABNORMAL LOW (ref 135–145)

## 2009-07-27 ENCOUNTER — Emergency Department (HOSPITAL_COMMUNITY): Admission: EM | Admit: 2009-07-27 | Discharge: 2009-07-28 | Payer: Self-pay | Admitting: Emergency Medicine

## 2009-07-28 LAB — CONVERTED CEMR LAB
CO2: 17 meq/L
Calcium: 8 mg/dL
Chloride: 110 meq/L
Creatinine, Ser: 1.14 mg/dL
GFR calc Af Amer: 59 mL/min
Glucose, Bld: 182 mg/dL
Sodium: 133 meq/L

## 2009-08-06 ENCOUNTER — Ambulatory Visit: Payer: Self-pay | Admitting: Internal Medicine

## 2009-08-07 ENCOUNTER — Telehealth (INDEPENDENT_AMBULATORY_CARE_PROVIDER_SITE_OTHER): Payer: Self-pay | Admitting: Internal Medicine

## 2009-08-07 LAB — CONVERTED CEMR LAB
Calcium: 8.6 mg/dL (ref 8.4–10.5)
Chloride: 111 meq/L (ref 96–112)
Creatinine, Ser: 2.13 mg/dL — ABNORMAL HIGH (ref 0.40–1.20)
Potassium: 5.3 meq/L (ref 3.5–5.3)
Sodium: 137 meq/L (ref 135–145)

## 2009-08-09 ENCOUNTER — Telehealth: Payer: Self-pay | Admitting: Internal Medicine

## 2009-08-13 ENCOUNTER — Ambulatory Visit: Payer: Self-pay | Admitting: Internal Medicine

## 2009-08-14 ENCOUNTER — Inpatient Hospital Stay (HOSPITAL_COMMUNITY): Admission: EM | Admit: 2009-08-14 | Discharge: 2009-08-15 | Payer: Self-pay | Admitting: Emergency Medicine

## 2009-08-14 ENCOUNTER — Ambulatory Visit: Payer: Self-pay | Admitting: Internal Medicine

## 2009-08-14 ENCOUNTER — Encounter: Payer: Self-pay | Admitting: Internal Medicine

## 2009-08-14 LAB — CONVERTED CEMR LAB
BUN: 57 mg/dL — ABNORMAL HIGH (ref 6–23)
Calcium: 9.4 mg/dL (ref 8.4–10.5)
Creatinine, Ser: 2.39 mg/dL — ABNORMAL HIGH (ref 0.40–1.20)
Phosphorus: 4.6 mg/dL (ref 2.3–4.6)

## 2009-08-15 ENCOUNTER — Encounter: Payer: Self-pay | Admitting: Internal Medicine

## 2009-08-16 ENCOUNTER — Encounter: Payer: Self-pay | Admitting: Internal Medicine

## 2009-08-25 ENCOUNTER — Emergency Department (HOSPITAL_COMMUNITY): Admission: EM | Admit: 2009-08-25 | Discharge: 2009-08-25 | Payer: Self-pay | Admitting: Emergency Medicine

## 2009-08-27 ENCOUNTER — Ambulatory Visit: Payer: Self-pay | Admitting: Internal Medicine

## 2009-08-27 LAB — CONVERTED CEMR LAB
Calcium: 8.7 mg/dL (ref 8.4–10.5)
Creatinine, Ser: 1.43 mg/dL — ABNORMAL HIGH (ref 0.40–1.20)

## 2009-08-28 ENCOUNTER — Telehealth: Payer: Self-pay | Admitting: Internal Medicine

## 2009-08-28 ENCOUNTER — Telehealth: Payer: Self-pay | Admitting: *Deleted

## 2009-08-30 ENCOUNTER — Telehealth: Payer: Self-pay | Admitting: Internal Medicine

## 2009-09-19 ENCOUNTER — Telehealth: Payer: Self-pay | Admitting: Internal Medicine

## 2009-09-24 ENCOUNTER — Ambulatory Visit: Payer: Self-pay | Admitting: Internal Medicine

## 2009-09-24 LAB — CONVERTED CEMR LAB
BUN: 43 mg/dL — ABNORMAL HIGH (ref 6–23)
Blood Glucose, Fingerstick: 72
CO2: 19 meq/L (ref 19–32)
Calcium: 8.8 mg/dL (ref 8.4–10.5)
Creatinine, Ser: 1.57 mg/dL — ABNORMAL HIGH (ref 0.40–1.20)
Glucose, Bld: 65 mg/dL — ABNORMAL LOW (ref 70–99)
Hgb A1c MFr Bld: 7.6 %
Hgb A1c MFr Bld: 7.6 %

## 2009-10-02 ENCOUNTER — Telehealth (INDEPENDENT_AMBULATORY_CARE_PROVIDER_SITE_OTHER): Payer: Self-pay | Admitting: *Deleted

## 2009-11-12 ENCOUNTER — Encounter: Payer: Self-pay | Admitting: Internal Medicine

## 2009-11-22 ENCOUNTER — Telehealth: Payer: Self-pay | Admitting: Internal Medicine

## 2009-12-04 ENCOUNTER — Telehealth: Payer: Self-pay | Admitting: Internal Medicine

## 2010-01-09 ENCOUNTER — Encounter: Payer: Self-pay | Admitting: Internal Medicine

## 2010-01-09 ENCOUNTER — Ambulatory Visit: Payer: Self-pay | Admitting: Internal Medicine

## 2010-01-14 ENCOUNTER — Telehealth: Payer: Self-pay | Admitting: Internal Medicine

## 2010-01-15 ENCOUNTER — Ambulatory Visit: Payer: Self-pay

## 2010-01-20 LAB — CONVERTED CEMR LAB
Albumin: 3.7 g/dL (ref 3.5–5.2)
Alkaline Phosphatase: 84 units/L (ref 39–117)
BUN: 31 mg/dL — ABNORMAL HIGH (ref 6–23)
Creatinine, Ser: 1.42 mg/dL — ABNORMAL HIGH (ref 0.40–1.20)
Glucose, Bld: 68 mg/dL — ABNORMAL LOW (ref 70–99)
Hemoglobin: 11 g/dL — ABNORMAL LOW (ref 12.0–15.0)
MCHC: 32.4 g/dL (ref 30.0–36.0)
Potassium: 4.2 meq/L (ref 3.5–5.3)
Prolactin: 19.5 ng/mL
RBC: 3.76 M/uL — ABNORMAL LOW (ref 3.87–5.11)
Total Bilirubin: 0.2 mg/dL — ABNORMAL LOW (ref 0.3–1.2)

## 2010-01-22 ENCOUNTER — Encounter: Admission: RE | Admit: 2010-01-22 | Discharge: 2010-01-22 | Payer: Self-pay | Admitting: Internal Medicine

## 2010-02-04 ENCOUNTER — Encounter: Payer: Self-pay | Admitting: Internal Medicine

## 2010-03-03 ENCOUNTER — Ambulatory Visit: Admit: 2010-03-03 | Payer: Self-pay

## 2010-03-13 ENCOUNTER — Ambulatory Visit: Admission: RE | Admit: 2010-03-13 | Discharge: 2010-03-13 | Payer: Self-pay | Source: Home / Self Care

## 2010-03-13 DIAGNOSIS — L0292 Furuncle, unspecified: Secondary | ICD-10-CM | POA: Insufficient documentation

## 2010-03-13 DIAGNOSIS — L0293 Carbuncle, unspecified: Secondary | ICD-10-CM

## 2010-03-13 LAB — CONVERTED CEMR LAB: Blood Glucose, Fingerstick: 196

## 2010-03-15 ENCOUNTER — Encounter: Payer: Self-pay | Admitting: Surgery

## 2010-03-16 ENCOUNTER — Encounter: Payer: Self-pay | Admitting: Internal Medicine

## 2010-03-17 LAB — GLUCOSE, CAPILLARY: Glucose-Capillary: 196 mg/dL — ABNORMAL HIGH (ref 70–99)

## 2010-03-18 ENCOUNTER — Ambulatory Visit: Admit: 2010-03-18 | Payer: Self-pay | Admitting: Family Medicine

## 2010-03-27 NOTE — Assessment & Plan Note (Signed)
Summary: ACUTE-ER/FU/(RIOFRIO)/CFB   Vital Signs:  Patient profile:   60 year old female Height:      66.5 inches (168.91 cm) Weight:      176.01 pounds (80.00 kg) BMI:     28.08 Temp:     98.4 degrees F (36.89 degrees C) oral Pulse rate:   77 / minute BP sitting:   102 / 59  (left arm)  Vitals Entered By: Angelina Ok RN (August 06, 2009 10:06 AM) CC: Depression Is Patient Diabetic? Yes Did you bring your meter with you today? No Pain Assessment Patient in pain? yes     Location: breast, hip and thigh Intensity: 8/10 Type: aching, throbbing Onset of pain  Constant Nutritional Status BMI of 25 - 29 = overweight CBG Result 155  Have you ever been in a relationship where you felt threatened, hurt or afraid?No   Does patient need assistance? Functional Status Self care Ambulation Normal Comments Wnt to the ER for Breast Pain.  Was given pain med.  Was red and sore.  Was hot to touch.  Here follow up of today.  Problems with BM.  Had a BM on Monday.  Does not go everyday.   No gas or Bloating.  Has not completed the Antibiotic.  Still having pain.   [facility] Provider:  [facility]  CC:  Depression.  History of Present Illness: [name] is a 60 yo woman wtih PMH as oultined below.  She is here for ER f/u on 6/5.  She was seen for right sided chest and left breast pain.  She had a w/u including CTA chest and was discharged with diagnosis of abscess of left breast.  She has had no further episodes of chest pain.  She was sent out with a script for bactrim x 7 days for boil (until tomorrow morning).  She reports it is no better than before.  Still sore and red.    Depression History:      The patient denies a depressed mood most of the day and a diminished interest in her usual daily activities.         Current Medications (verified): 1)  Creon 24000 Unit Cpep (Pancrelipase (Lip-Prot-Amyl)) .Marland Kitchen.. 1-3 Tablets With Meals and Snacks 2)  Lisinopril 20 Mg Tabs  (Lisinopril) .... Take 1 Tablet By Mouth Once A Day 3)  Lantus 100 Unit/ml Soln (Insulin Glargine) .... Inject 25 Units Subcutaneously in The Morning With Breakfast 4)  Accuneb 0.63 Mg/46ml  Nebu (Albuterol Sulfate) .... 2 Puff Prn 5)  Prodigy Autocode Blood Glucose  Strp (Glucose Blood) .... Use To Check Your Blood Sugar 3 Times Daily 6)  Prodigy Insulin Syringe 31g X 5/16" 0.3 Ml Misc (Insulin Syringe-Needle U-100) .... Use To Inject Your Insulin Once Daily 7)  Prodigy Twist Top Lancets 28g  Misc (Lancets) .... Use With Your Insulin As Instructed 8)  Ventolin Hfa 108 (90 Base) Mcg/act Aers (Albuterol Sulfate) .... Take 1-2 Puffs Every 4-6 Hours As Needed For Shortness of Breath 9)  Tramadol Hcl 50 Mg Tabs (Tramadol Hcl) .... Take 1 Tablet By Mouth Three Times A Day As Needed For Pain. 10)  Lidocaine-Hydrocortisone Ace 3-1 % Kit (Lidocaine-Hydrocortisone Ace) .... Apply To Area Two Times A Day  Allergies (verified): No Known Drug Allergies  Past History:  Past Medical History: Last updated: 09/30/2006 Hx of Etoh abuse Diabetes mellitus, type II Hypertension Peripheral neuropathy Osteoporosis Pancreatitis, hx of - chronic Renal insufficiency - baseline creatinine: 1.2 Elevated liver  enzymes L labial abscess s/p outpt I+D 09/30/06  Past Surgical History: Last updated: 03/15/2006 Cataract extraction both eyes Cholecystectomy  Family History: Last updated: 05/17/07 Mother died of Alzheimer's Disease 77yo Father died of Asthma  Social History: Last updated: May 17, 2007 Hx ETOH, sober since 2006 Smoker Disability Lives alone at [facility]  Risk Factors: Smoking Status: current (07/12/2009) Packs/Day: 1/2 ppd (07/12/2009)  Review of Systems      See HPI  Physical Exam  General:  alert, well-developed, and well-nourished.   Eyes:  pupils equal, pupils round, and pupils reactive to light.   Neck:  supple, no JVD, and no carotid bruits.   Breasts:  right breast  normal left breast without masses.  there is erythema, marked tenderness and induration superficially between the 10 and 12 o'clock margin of the areola. Lungs:  normal respiratory effort, no accessory muscle use, normal breath sounds, no crackles, and no wheezes.   Heart:  normal rate, regular rhythm, no gallop, no rub, and no JVD.  Grade 3/6 holosystolic murmur Abdomen:  non-tender and normal bowel sounds.   Extremities:  no edema.  Neurologic:  alert & oriented X3.  non focal. Psych:  Oriented X3, memory intact for recent and remote, and normally interactive.     Impression & Recommendations:  Problem # 1:  ABSCESS, BREAST, LEFT (ICD-611.0) Appears to be an abscess, not improved with 6 days of bactrim Differential includes paget dz of breast and CA (last mammogram 02/2008) and actually ordered today prior to seeing pt (will need to be done after this resolves) Will refer to surgeon (Dr. Jamey Ripa today at 2pm) for further eval and likely I&D vs [place] to assess for abscess vs mass. Will also continue bactrim for another week in the meantime.  Problem # 2:  HYPERTENSION (ICD-401.9) will recheck BMP today as some renal insufficiency noted previously of note, this resolved on blood work in hospital but bicarb remains low.  Her updated medication list for this problem includes:    Lisinopril 20 Mg Tabs (Lisinopril) .Marland Kitchen... Take 1 tablet by mouth once a day  Orders: T-Basic Metabolic Panel [id number])  BP today: 102/59 Prior BP: 110/63 (07/12/2009)  Labs Reviewed: K+: 3.9 (07/28/2009) Creat: : 1.14 (07/28/2009)   Chol: 126 (05/22/2008)   HDL: 58 (05/22/2008)   LDL: 39 (05/22/2008)   TG: 145 (05/22/2008)  Complete Medication List: 1)  Creon 24000 Unit Cpep (Pancrelipase (lip-prot-amyl)) .Marland Kitchen.. 1-3 tablets with meals and snacks 2)  Lisinopril 20 Mg Tabs (Lisinopril) .... Take 1 tablet by mouth once a day 3)  Lantus 100 Unit/ml Soln (Insulin glargine) .... Inject 25 units subcutaneously in the  morning with breakfast 4)  Accuneb 0.63 Mg/67ml Nebu (Albuterol sulfate) .... 2 puff prn 5)  Prodigy Autocode Blood Glucose Strp (Glucose blood) .... Use to check your blood sugar 3 times daily 6)  Prodigy Insulin Syringe 31g X 5/16" 0.3 Ml Misc (Insulin syringe-needle u-100) .... Use to inject your insulin once daily 7)  Prodigy Twist Top Lancets 28g Misc (Lancets) .... Use with your insulin as instructed 8)  Ventolin Hfa 108 (90 Base) Mcg/act Aers (Albuterol sulfate) .... Take 1-2 puffs every 4-6 hours as needed for shortness of breath 9)  Tramadol Hcl 50 Mg Tabs (Tramadol hcl) .... Take 1 tablet by mouth three times a day as needed for pain. 10)  Lidocaine-hydrocortisone Ace 3-1 % Kit (Lidocaine-hydrocortisone ace) .... Apply to area two times a day 11)  Bactrim Ds 800-160 Mg Tabs (Sulfamethoxazole-trimethoprim) .... Take 1  tablet by mouth two times a day  Other Orders: Capillary Blood Glucose/CBG (16109) Mammogram (Screening) (Mammo)  Patient Instructions: 1)  Please schedule a follow-up appointment in 1 week to reassess abscess 2)  Will schedule with surgeon as discussed 3)  Have provided a prescription for another week of antibiotic as prescribed below. 4)  Will need to get mammogram after this resolves 5)  If you have any worsening or other problems, call clinic.  Prescriptions: BACTRIM DS 800-160 MG TABS (SULFAMETHOXAZOLE-TRIMETHOPRIM) Take 1 tablet by mouth two times a day  #14 x 0   Entered and Authorized by:   Mariea Stable MD   Signed by:   Mariea Stable MD on 08/06/2009   Method used:   Electronically to        [facility] (retail)       [address]       [place], [place]  [address]       Ph: [phone]       Fax: [phone]   RxID:   [id number] BACTRIM DS 800-160 MG TABS (SULFAMETHOXAZOLE-TRIMETHOPRIM) Take 1 tablet by mouth two times a day  #14 x 0   Entered and Authorized by:   Mariea Stable MD   Signed by:   Mariea Stable MD on 08/06/2009   Method used:   Electronically to        [facility] (retail)       [address]       [place], [place]  [address]       Ph: [phone]       Fax: [phone]   RxID:   [id number]   Prevention & Chronic Care Immunizations   Influenza vaccine: Not documented   Influenza vaccine deferral: Not available  (08/06/2009)    Tetanus booster: Not documented   Td booster deferral: Deferred  (09/25/2008)    Pneumococcal vaccine: Not documented   Pneumococcal vaccine deferral: Deferred  (09/25/2008)  Colorectal Screening   Hemoccult: Not documented    Colonoscopy: Normal  (09/07/2005)  Other Screening   Pap smear: Normal  (08/27/2004)   Pap smear action/deferral: Deferred  (08/06/2009)    Mammogram: ASSESSMENT: Negative - BI-RADS 1^MM DIGITAL SCREENING  (02/28/2008)   Mammogram action/deferral: Ordered  (08/06/2009)   Mammogram due: 02/27/2009   Smoking status: current  (07/12/2009)   Smoking cessation counseling: yes  (07/12/2009)  Diabetes Mellitus   HgbA1C: 8.1  (07/12/2009)   Hemoglobin A1C due: 12/26/2008    Eye exam: Mild non-proliferative diabetic retinopathy.  OU    (11/30/2008)    Foot exam: yes  (09/25/2008)   Foot exam action/deferral: Do today   High risk foot: Yes  (09/25/2008)   Foot care education: Done  (09/25/2008)    Urine microalbumin/creatinine ratio: 130.1  (07/16/2008)   Urine microalbumin action/deferral: Not indicated    Diabetes flowsheet reviewed?: Yes   Progress toward A1C goal: Unchanged  Lipids   Total Cholesterol: 126  (05/22/2008)   LDL: 39  (05/22/2008)   LDL Direct: Not documented   HDL: 58  (05/22/2008)   Triglycerides: 145  (05/22/2008)   Lipid panel due: 09/25/2009  Hypertension   Last Blood Pressure: 102 / 59  (08/06/2009)   Serum creatinine: 1.14  (07/28/2009)   BMP action: Ordered   Serum potassium 3.9  (07/28/2009)   Basic metabolic panel due: 10/26/2008     Hypertension flowsheet reviewed?: Yes   Progress toward BP goal: At goal  Self-Management Support :  Personal Goals (by the next clinic visit) :     Personal A1C goal: 7  (09/25/2008)     Personal blood pressure goal: 130/80  (09/25/2008)     Personal LDL goal: 70  (09/25/2008)    Patient will work on the following items until the next clinic visit to reach self-care goals:     Medications and monitoring: take my medicines every day, check my blood sugar, check my blood pressure, bring all of my medications to every visit, examine my feet every day  (08/06/2009)     Eating: drink diet soda or water instead of juice or soda, eat more vegetables, use fresh or frozen vegetables, eat foods that are low in salt, eat baked foods instead of fried foods, eat fruit for snacks and desserts, limit or avoid alcohol  (08/06/2009)     Activity: take a 30 minute walk every day  (08/06/2009)    Diabetes self-management support: Education handout, Psychologist, forensic, Resources for patients handout, Written self-care plan  (08/06/2009)   Diabetes care plan printed   Diabetes education handout printed    Hypertension self-management support: Education handout, Pre-printed educational material, Resources for patients handout, Written self-care plan  (08/06/2009)   Hypertension self-care plan printed.   Hypertension education handout printed      Resource handout printed.   Nursing Instructions: Schedule screening mammogram (see order)   Process Orders Check Orders Results:     [facility]: ABN not required for this insurance Order queued for requisitioning for [facility]: August 06, 2009 10:53 AM  Tests Sent for requisitioning (August 06, 2009 10:53 AM):     08/06/2009: [facility] -- T-Basic Metabolic Panel [phone] (signed)     Vital Signs:  Patient profile:   60 year old female Height:      66.5 inches (168.91 cm) Weight:      176.01 pounds  (80.00 kg) BMI:     28.08 Temp:     98.4 degrees F (36.89 degrees C) oral Pulse rate:   77 / minute BP sitting:   102 / 59  (left arm)  Vitals Entered By: Angelina Ok RN (August 06, 2009 10:06 AM)

## 2010-03-27 NOTE — Assessment & Plan Note (Signed)
Summary: ACUTE-MEDICATIONS UNABLE TO SEE dR RIOFRIO/CFB   Vital Signs:  Patient profile:   60 year old female Height:      66.5 inches (168.91 cm) Weight:      174.1 pounds (79.14 kg) BMI:     27.78 Temp:     97.1 degrees F (36.17 degrees C) oral Pulse rate:   82 / minute BP sitting:   110 / 63  (right arm)  Vitals Entered By: Stanton Kidney Ditzler RN (Jul 12, 2009 10:17 AM) CC: Depression Is Patient Diabetic? Yes Did you bring your meter with you today? No Pain Assessment Patient in pain? yes     Location: rectum Intensity: 9 Type: painful Onset of pain  Hx hemorrhoids - sees Dr Marina Goodell - using cream Nutritional Status BMI of 25 - 29 = overweight Nutritional Status Detail appetite fair CBG Result 160  Have you ever been in a relationship where you felt threatened, hurt or afraid?denies   Does patient need assistance? Functional Status Self care Ambulation Normal Comments For months - all joints hurts.   [facility]  [facility]  CC:  Depression.  History of Present Illness: 60 year old Past Medical History: Hx of Etoh abuse Diabetes mellitus, type II Hypertension Peripheral neuropathy Osteoporosis Pancreatitis, hx of - chronic Renal insufficiency - baseline creatinine: 1.2 Elevated liver enzymes L labial abscess s/p outpt I+D 09/30/06   She is here complaining of back pain, thigh pain, shoulder pain getting worse over past month. Her pain medications are not helping. 10/10.   SHe is willing to try physical therapy.  She is checking her blood sugar in the morning 80 to 110.  She is following diet. She is using her insulin.   Depression History:      The patient denies a depressed mood most of the day and a diminished interest in her usual daily activities.         Preventive Screening-Counseling & Management  Alcohol-Tobacco     Alcohol type: beer occasionally     Smoking Status: current     Smoking Cessation Counseling: yes     Packs/Day:  1/2 ppd     Year Started: at age of 53  Caffeine-Diet-Exercise     Does Patient Exercise: no  Current Medications (verified): 1)  Creon 24000 Unit Cpep (Pancrelipase (Lip-Prot-Amyl)) .Marland Kitchen.. 1-3 Tablets With Meals and Snacks 2)  Lisinopril 20 Mg Tabs (Lisinopril) .... Take 1 Tablet By Mouth Once A Day 3)  Lantus 100 Unit/ml Soln (Insulin Glargine) .... Inject 25 Units Subcutaneously in The Morning With Breakfast 4)  Accuneb 0.63 Mg/31ml  Nebu (Albuterol Sulfate) .... 2 Puff Prn 5)  Anamantle Hc 3-0.5 %  Crea (Lidocaine-Hydrocortisone Ace) .... Apply To External Hemmorhoids Two Times A Day For Burning and Pain 6)  Prodigy Autocode Blood Glucose  Strp (Glucose Blood) .... Use To Check Your Blood Sugar 3 Times Daily 7)  Prodigy Insulin Syringe 31g X 5/16" 0.3 Ml Misc (Insulin Syringe-Needle U-100) .... Use To Inject Your Insulin Once Daily 8)  Prodigy Twist Top Lancets 28g  Misc (Lancets) .... Use With Your Insulin As Instructed 9)  Ventolin Hfa 108 (90 Base) Mcg/act Aers (Albuterol Sulfate) .... Take 1-2 Puffs Every 4-6 Hours As Needed For Shortness of Breath  Allergies: No Known Drug Allergies  Review of Systems  The patient denies fever, chest pain, syncope, dyspnea on exertion, prolonged cough, headaches, abdominal pain, and melena.    Physical Exam  General:  alert, well-developed, and  well-nourished.   Head:  normocephalic and atraumatic.   Lungs:  normal respiratory effort, no intercostal retractions, no accessory muscle use, and normal breath sounds.   Heart:  normal rate and regular rhythm.   Abdomen:  soft, non-tender, normal bowel sounds, and no distention.   Extremities:  no edema.    Impression & Recommendations:  Problem # 1:  HYPERTENSION (ICD-401.9) Blood pressure well controlled. I will check bmet.  Her updated medication list for this problem includes:    Lisinopril 20 Mg Tabs (Lisinopril) .Marland Kitchen... Take 1 tablet by mouth once a day  Orders: T-Basic Metabolic Panel  ([id number])  BP today: 110/63 Prior BP: 140/68 (03/15/2009)  Labs Reviewed: K+: 5.3 (11/21/2008) Creat: : 1.14 (11/21/2008)   Chol: 126 (05/22/2008)   HDL: 58 (05/22/2008)   LDL: 39 (05/22/2008)   TG: 145 (05/22/2008)  Problem # 2:  HIP PAIN, BILATERAL (ICD-719.45) I will try tramadol for pain. I will also  refer her to physical therapy.  Her updated medication list for this problem includes:    Tramadol Hcl 50 Mg Tabs (Tramadol hcl) .Marland Kitchen... Take 1 tablet by mouth three times a day as needed for pain.  Orders: Physical Therapy Referral (PT)  Problem # 3:  DIABETES MELLITUS, TYPE II (ICD-250.00) Her HBA1 c has increase to 8. She relates that she has been taking her insulin. I advised patient to check blood sugar three times a day.  She will bring meter next visit. Will need to adjust medication next vist. I will check Bmet to see if we can use metformin. She might need SSI.  Her updated medication list for this problem includes:    Lisinopril 20 Mg Tabs (Lisinopril) .Marland Kitchen... Take 1 tablet by mouth once a day    Lantus 100 Unit/ml Soln (Insulin glargine) ..... Inject 25 units subcutaneously in the morning with breakfast  Orders: T- Capillary Blood Glucose (09811) T-Hgb A1C (in-house) ([id number])  Labs Reviewed: Creat: 1.14 (11/21/2008)     Last Eye Exam: Mild non-proliferative diabetic retinopathy.  OU   (11/30/2008) Reviewed HgBA1c results: 8.1 (07/12/2009)  7.5 (03/15/2009)  Complete Medication List: 1)  Creon 24000 Unit Cpep (Pancrelipase (lip-prot-amyl)) .Marland Kitchen.. 1-3 tablets with meals and snacks 2)  Lisinopril 20 Mg Tabs (Lisinopril) .... Take 1 tablet by mouth once a day 3)  Lantus 100 Unit/ml Soln (Insulin glargine) .... Inject 25 units subcutaneously in the morning with breakfast 4)  Accuneb 0.63 Mg/43ml Nebu (Albuterol sulfate) .... 2 puff prn 5)  Anamantle Hc 3-0.5 % Crea (Lidocaine-hydrocortisone ace) .... Apply to external hemmorhoids two times a day for burning and pain 6)   Prodigy Autocode Blood Glucose Strp (Glucose blood) .... Use to check your blood sugar 3 times daily 7)  Prodigy Insulin Syringe 31g X 5/16" 0.3 Ml Misc (Insulin syringe-needle u-100) .... Use to inject your insulin once daily 8)  Prodigy Twist Top Lancets 28g Misc (Lancets) .... Use with your insulin as instructed 9)  Ventolin Hfa 108 (90 Base) Mcg/act Aers (Albuterol sulfate) .... Take 1-2 puffs every 4-6 hours as needed for shortness of breath 10)  Tramadol Hcl 50 Mg Tabs (Tramadol hcl) .... Take 1 tablet by mouth three times a day as needed for pain.  Patient Instructions: 1)  Please check blood sugar fasting in the morning and 2 hours after meals. 2)  Please bring your meter next visit.  3)  Please schedule a follow-up appointment in 2 weeks. 4)  You have a new medication for pain.  Prescriptions:  TRAMADOL HCL 50 MG TABS (TRAMADOL HCL) Take 1 tablet by mouth three times a day as needed for pain.  #90 x 2   Entered and Authorized by:   Hartley Barefoot MD   Signed by:   Hartley Barefoot MD on 07/12/2009   Method used:   Electronically to        [facility] (retail)       [address]       [place], [place]  [id number]       Ph: [phone]       Fax: [phone]   RxID:   [id number] LISINOPRIL 20 MG TABS (LISINOPRIL) Take 1 tablet by mouth once a day  #93 x 2   Entered and Authorized by:   Hartley Barefoot MD   Signed by:   Hartley Barefoot MD on 07/12/2009   Method used:   Electronically to        [facility] (retail)       [address]       [place], [place]  [address]       Ph: [phone]       Fax: [phone]   RxID:   [id number] CREON 24000 UNIT CPEP (PANCRELIPASE (LIP-PROT-AMYL)) 1-3 tablets with meals and snacks  #300 x 6   Entered and Authorized by:   Hartley Barefoot MD   Signed by:   Hartley Barefoot MD on 07/12/2009   Method used:   Electronically to        [facility] (retail)        [address]       [place], [place]  [address]       Ph: [phone]       Fax: [phone]   RxID:   [id number]   Prevention & Chronic Care Immunizations   Influenza vaccine: Not documented   Influenza vaccine deferral: Deferred  (09/25/2008)    Tetanus booster: Not documented   Td booster deferral: Deferred  (09/25/2008)    Pneumococcal vaccine: Not documented   Pneumococcal vaccine deferral: Deferred  (09/25/2008)  Colorectal Screening   Hemoccult: Not documented    Colonoscopy: Normal  (09/07/2005)  Other Screening   Pap smear: Normal  (08/27/2004)    Mammogram: ASSESSMENT: Negative - BI-RADS 1^MM DIGITAL SCREENING  (02/28/2008)   Mammogram due: 02/27/2009   Smoking status: current  (07/12/2009)   Smoking cessation counseling: yes  (07/12/2009)  Diabetes Mellitus   HgbA1C: 8.1  (07/12/2009)   Hemoglobin A1C due: 12/26/2008    Eye exam: Mild non-proliferative diabetic retinopathy.  OU    (11/30/2008)    Foot exam: yes  (09/25/2008)   Foot exam action/deferral: Do today   High risk foot: Yes  (09/25/2008)   Foot care education: Done  (09/25/2008)    Urine microalbumin/creatinine ratio: 130.1  (07/16/2008)  Lipids   Total Cholesterol: 126  (05/22/2008)   LDL: 39  (05/22/2008)   LDL Direct: Not documented   HDL: 58  (05/22/2008)   Triglycerides: 145  (05/22/2008)   Lipid panel due: 09/25/2009  Hypertension   Last Blood Pressure: 110 / 63  (07/12/2009)   Serum creatinine: 1.14  (11/21/2008)   Serum potassium 5.3  (11/21/2008)   Basic metabolic panel due: 10/26/2008  Self-Management Support :   Personal Goals (by the next clinic visit) :     Personal A1C goal: 7  (09/25/2008)     Personal blood pressure goal: 130/80  (09/25/2008)  Personal LDL goal: 70  (09/25/2008)    Patient will work on the following items until the next clinic visit to reach self-care goals:     Medications and monitoring: take my medicines every day, check my  blood sugar, check my blood pressure, weigh myself weekly, examine my feet every day  (07/12/2009)     Eating: drink diet soda or water instead of juice or soda, eat more vegetables, use fresh or frozen vegetables, eat foods that are low in salt, eat baked foods instead of fried foods, eat fruit for snacks and desserts  (07/12/2009)     Activity: take a 30 minute walk every day  (07/12/2009)    Diabetes self-management support: Written self-care plan, Education handout, Resources for patients handout  (07/12/2009)   Diabetes care plan printed   Diabetes education handout printed    Hypertension self-management support: Written self-care plan, Education handout, Resources for patients handout  (07/12/2009)   Hypertension self-care plan printed.   Hypertension education handout printed      Resource handout printed.  Process Orders Check Orders Results:     [facility]: ABN not required for this insurance Tests Sent for requisitioning (Jul 12, 2009 3:13 PM):     07/12/2009: [facility] -- T-Basic Metabolic Panel [phone] (signed)    Laboratory Results   Blood Tests   Date/Time Received: Jul 12, 2009 10:37 AM  Date/Time Reported: Alric Quan  Jul 12, 2009 10:37 AM   HGBA1C: 8.1%   (Normal Range: Non-Diabetic - 3-6%   Control Diabetic - 6-8%) CBG Random:: 160mg /dL      Process Orders Check Orders Results:     [facility]: ABN not required for this insurance Tests Sent for requisitioning (Jul 12, 2009 3:13 PM):     07/12/2009: [facility] -- T-Basic Metabolic Panel [phone] (signed)

## 2010-03-27 NOTE — Letter (Signed)
Summary: [facility] ORDER  [facility] ORDER   Imported By: Shon Hough 03/07/2010 14:02:13  _____________________________________________________________________  External Attachment:    Type:   Image     Comment:   External Document

## 2010-03-27 NOTE — Assessment & Plan Note (Signed)
Summary: EST-1 MONTH F/U VISIT/CH   Vital Signs:  Patient profile:   60 year old female Height:      66.5 inches (168.91 cm) Weight:      181.9 pounds (82.68 kg) Temp:     97.6 degrees F (36.44 degrees C) oral Pulse rate:   68 / minute BP sitting:   133 / 65  (left arm) Cuff size:   large  Vitals Entered By: Cynda Familia Duncan Dull) (September 24, 2009 3:49 PM) Is Patient Diabetic? Yes Did you bring your meter with you today? No Pain Assessment Patient in pain? yes     Location: "joints" Intensity: 10 Type: pt unable to describe Onset of pain  Chronic "everyday" Nutritional Status BMI of > 30 = obese CBG Result 72  Have you ever been in a relationship where you felt threatened, hurt or afraid?No   Does patient need assistance? Functional Status Self care Ambulation Normal   Primary Care Provider:  Bethel Born MD   History of Present Illness: 60 y/o woman with pmh of DJD, resovling acute renal failure (requiring admission in June 2011) DM comes for a routine follow up visit. no complaints today. has been taking medications regularly.   Preventive Screening-Counseling & Management  Alcohol-Tobacco     Alcohol type: beer occasionally     Smoking Status: current     Smoking Cessation Counseling: yes     Packs/Day: 1/2 ppd     Year Started: at age of 38  Current Medications (verified): 1)  Creon 24000 Unit Cpep (Pancrelipase (Lip-Prot-Amyl)) .Marland Kitchen.. 1-3 Tablets With Meals and Snacks 2)  Lantus 100 Unit/ml Soln (Insulin Glargine) .... Inject 25 Units Subcutaneously in The Morning With Breakfast 3)  Accuneb 0.63 Mg/28ml  Nebu (Albuterol Sulfate) .... 2 Puff Prn 4)  Prodigy Autocode Blood Glucose  Strp (Glucose Blood) .... Use To Check Your Blood Sugar 3 Times Daily 5)  Prodigy Insulin Syringe 31g X 5/16" 0.3 Ml Misc (Insulin Syringe-Needle U-100) .... Use To Inject Your Insulin Once Daily 6)  Prodigy Twist Top Lancets 28g  Misc (Lancets) .... Use With Your Insulin As  Instructed 7)  Ventolin Hfa 108 (90 Base) Mcg/act Aers (Albuterol Sulfate) .... Take 1-2 Puffs Every 4-6 Hours As Needed For Shortness of Breath 8)  Tramadol Hcl 50 Mg Tabs (Tramadol Hcl) .... Take 1 Tablet By Mouth Three Times A Day As Needed For Pain. 9)  Amlodipine Besylate 5 Mg Tabs (Amlodipine Besylate) .... Take 1 Tab At Night Daily 10)  Miralax  Powd (Polyethylene Glycol 3350) .... Mix With Water and Drink Once O Twice A Day For Constipation 11)  Proctofoam 1 % Foam (Pramoxine Hcl) .... Twice A Day For Itching and Pain  Allergies (verified): No Known Drug Allergies  Review of Systems  The patient denies anorexia, fever, weight loss, weight gain, vision loss, decreased hearing, hoarseness, chest pain, syncope, dyspnea on exertion, peripheral edema, prolonged cough, headaches, hemoptysis, abdominal pain, melena, hematochezia, severe indigestion/heartburn, hematuria, incontinence, genital sores, muscle weakness, suspicious skin lesions, transient blindness, difficulty walking, depression, unusual weight change, abnormal bleeding, enlarged lymph nodes, angioedema, breast masses, and testicular masses.    Physical Exam  General:  alert, well-developed, and well-nourished.   Head:  normocephalic and atraumatic.   Eyes:  pupils equal, pupils round, and pupils reactive to light.   Mouth:  pharynx pink and moist and no lesions.   Neck:  supple.   Lungs:  normal respiratory effort, no intercostal retractions, no accessory muscle use,  and normal breath sounds.   Heart:  normal rate, regular rhythm, and no murmur.   Abdomen:  soft, non-tender, normal bowel sounds, and no distention.   Pulses:  dorsalis pedis pulses normal bilaterally  Extremities:  .no edema Neurologic:  OrientedX3, cranial nerver 2-12 intact,strength good in all extremities, sensations normal to light touch, reflexes 2+ b/l, gait normal    Impression & Recommendations:  Problem # 1:  CONSTIPATION  (ICD-564.00) resoved  Her updated medication list for this problem includes:    Miralax Powd (Polyethylene glycol 3350) ..... Mix with water and drink once o twice a day for constipation  Problem # 2:  RENAL INSUFFICIENCY (ICD-588.9) will check bmet today.   * creatinine rising agian. not on any nephrotoxi meds/diuretics/ace. Will advise fluid hydration, avoid NSAIDS and conitnue to monitor.   Problem # 3:  HYPERTENSION (ICD-401.9) stable. a little higher than goal but would  not change medication for now. will increase norvasc to 10mg  if required in future.  Her updated medication list for this problem includes:    Amlodipine Besylate 5 Mg Tabs (Amlodipine besylate) .Marland Kitchen... Take 1 tab at night daily  BP today: 133/65 Prior BP: 149/70 (08/27/2009)  Labs Reviewed: K+: 4.5 (08/27/2009) Creat: : 1.43 (08/27/2009)   Chol: 126 (05/22/2008)   HDL: 58 (05/22/2008)   LDL: 39 (05/22/2008)   TG: 145 (05/22/2008)  Problem # 4:  PANCREATITIS, CHRONIC (ICD-577.1)  Complete Medication List: 1)  Creon 24000 Unit Cpep (Pancrelipase (lip-prot-amyl)) .Marland Kitchen.. 1-3 tablets with meals and snacks 2)  Lantus 100 Unit/ml Soln (Insulin glargine) .... Inject 25 units subcutaneously in the morning with breakfast 3)  Accuneb 0.63 Mg/11ml Nebu (Albuterol sulfate) .... 2 puff prn 4)  Prodigy Autocode Blood Glucose Strp (Glucose blood) .... Use to check your blood sugar 3 times daily 5)  Prodigy Insulin Syringe 31g X 5/16" 0.3 Ml Misc (Insulin syringe-needle u-100) .... Use to inject your insulin once daily 6)  Prodigy Twist Top Lancets 28g Misc (Lancets) .... Use with your insulin as instructed 7)  Ventolin Hfa 108 (90 Base) Mcg/act Aers (Albuterol sulfate) .... Take 1-2 puffs every 4-6 hours as needed for shortness of breath 8)  Tramadol Hcl 50 Mg Tabs (Tramadol hcl) .... Take 1 tablet by mouth three times a day as needed for pain. 9)  Amlodipine Besylate 5 Mg Tabs (Amlodipine besylate) .... Take 1 tab at night  daily 10)  Miralax Powd (Polyethylene glycol 3350) .... Mix with water and drink once o twice a day for constipation 11)  Proctofoam 1 % Foam (Pramoxine hcl) .... Twice a day for itching and pain  Other Orders: T-Hgb A1C (in-house) [phone]) T- Capillary Blood Glucose (45409) T-Basic Metabolic Panel ([id number]) Process Orders Check Orders Results:     [facility]: ABN not required for this insurance Tests Sent for requisitioning (September 26, 2009 10:10 AM):     09/24/2009: [facility] -- T-Basic Metabolic Panel [phone] (signed)     Prevention & Chronic Care Immunizations   Influenza vaccine: Not documented   Influenza vaccine deferral: Deferred  (08/27/2009)    Tetanus booster: 08/27/2009: Tdap   Td booster deferral: Deferred  (09/25/2008)    Pneumococcal vaccine: Pneumovax  (08/27/2009)   Pneumococcal vaccine deferral: Deferred  (09/25/2008)  Colorectal Screening   Hemoccult: Not documented   Hemoccult action/deferral: Deferred  (08/27/2009)    Colonoscopy: Normal  (09/07/2005)   Colonoscopy action/deferral: GI referral  (08/27/2009)  Other Screening   Pap smear: Normal  (  08/27/2004)   Pap smear action/deferral: Deferred  (08/06/2009)    Mammogram: ASSESSMENT: Negative - BI-RADS 1^MM DIGITAL SCREENING  (02/28/2008)   Mammogram action/deferral: Ordered  (08/06/2009)   Mammogram due: 02/27/2009   Smoking status: current  (09/24/2009)   Smoking cessation counseling: yes  (09/24/2009)  Diabetes Mellitus   HgbA1C: 7.6  (09/24/2009)   Hemoglobin A1C due: 12/26/2008    Eye exam: Mild non-proliferative diabetic retinopathy.  OU    (11/30/2008)    Foot exam: yes  (09/25/2008)   Foot exam action/deferral: Do today   High risk foot: Yes  (09/25/2008)   Foot care education: Done  (09/25/2008)    Urine microalbumin/creatinine ratio: 130.1  (07/16/2008)   Urine microalbumin action/deferral: Not indicated  Lipids   Total  Cholesterol: 126  (05/22/2008)   Lipid panel action/deferral: Deferred   LDL: 39  (05/22/2008)   LDL Direct: Not documented   HDL: 58  (05/22/2008)   Triglycerides: 145  (05/22/2008)   Lipid panel due: 09/25/2009  Hypertension   Last Blood Pressure: 133 / 65  (09/24/2009)   Serum creatinine: 1.43  (08/27/2009)   BMP action: Ordered   Serum potassium 4.5  (08/27/2009)   Basic metabolic panel due: 10/26/2008  Self-Management Support :   Personal Goals (by the next clinic visit) :     Personal A1C goal: 7  (09/25/2008)     Personal blood pressure goal: 130/80  (09/25/2008)     Personal LDL goal: 70  (09/25/2008)    Patient will work on the following items until the next clinic visit to reach self-care goals:     Medications and monitoring: take my medicines every day, check my blood sugar, examine my feet every day  (09/24/2009)     Eating: eat foods that are low in salt, eat baked foods instead of fried foods  (09/24/2009)     Activity: take a 30 minute walk every day  (08/27/2009)    Diabetes self-management support: Pre-printed educational material, Resources for patients handout  (09/24/2009)    Hypertension self-management support: Pre-printed educational material, Resources for patients handout  (09/24/2009)      Resource handout printed.   Laboratory Results   Blood Tests   Date/Time Received: September 24, 2009 4:15 PM Date/Time Reported: Alric Quan  September 24, 2009 4:15 PM   HGBA1C: 7.6%   (Normal Range: Non-Diabetic - 3-6%   Control Diabetic - 6-8%) CBG Random:: 72mg /dL

## 2010-03-27 NOTE — Assessment & Plan Note (Signed)
Summary: HANDS CRACKING/ (RIOFRIO)SB.   Vital Signs:  Patient profile:   60 year old female Height:      66.5 inches (168.91 cm) Weight:      182.1 pounds (82.77 kg) BMI:     29.06 Temp:     98.0 degrees F (36.67 degrees C) oral Pulse rate:   66 / minute BP sitting:   140 / 68  (left arm)  Vitals Entered By: Stanton Kidney Ditzler RN (March 15, 2009 1:44 PM) Is Patient Diabetic? Yes Did you bring your meter with you today? No Pain Assessment Patient in pain? no      Nutritional Status BMI of 25 - 29 = overweight Nutritional Status Detail appetite ok CBG Result 87  Have you ever been in a relationship where you felt threatened, hurt or afraid?denies   Does patient need assistance? Functional Status Self care Ambulation Normal Comments States dry skin on both fingers cracking.   Primary Care Provider:  [facility]   History of Present Illness: This is a 60 year old woman with past medical history outlined in this chart who is here with complaint of skin cracking on fingers.  This has been going on for several weeks.  She has tried various lotions and vasoline with no relief.  Skin is so thickend and dry that she bandages the fingers in order to do household tasks to prevent cracking.  She does not have any allergies that she knows of, has no handled any noxious chemicals, is only around two childred of very young age (not school age).  Fingers sometimes itch.  No lesions anywhere else on her body, no other complaints.  Depression History:      The patient denies a depressed mood most of the day and a diminished interest in her usual daily activities.         Preventive Screening-Counseling & Management  Alcohol-Tobacco     Alcohol type: beer occasionally     Smoking Status: current     Smoking Cessation Counseling: yes     Packs/Day: 1/2 ppd     Year Started: at age of 63  Caffeine-Diet-Exercise     Does Patient Exercise: no  Current Medications (verified): 1)   Creon 24000 Unit Cpep (Pancrelipase (Lip-Prot-Amyl)) .Marland Kitchen.. 1-3 Tablets With Meals and Snacks 2)  Lisinopril 20 Mg Tabs (Lisinopril) .... Take 1 Tablet By Mouth Once A Day 3)  Lantus 100 Unit/ml Soln (Insulin Glargine) .... Inject 25 Units Subcutaneously in The Morning With Breakfast 4)  Accuneb 0.63 Mg/17ml  Nebu (Albuterol Sulfate) .... 2 Puff Prn 5)  Anamantle Hc 3-0.5 %  Crea (Lidocaine-Hydrocortisone Ace) .... Apply To External Hemmorhoids Two Times A Day For Burning and Pain 6)  Voltaren 1 %  Gel (Diclofenac Sodium) .... Apply 2-4 G of Gel To Affected Areas Every 8 Hours As Needed For Pain. 7)  Prodigy Autocode Blood Glucose  Strp (Glucose Blood) .... Use To Check Your Blood Sugar 3 Times Daily 8)  Prodigy Insulin Syringe 31g X 5/16" 0.3 Ml Misc (Insulin Syringe-Needle U-100) .... Use To Inject Your Insulin Once Daily 9)  Prodigy Twist Top Lancets 28g  Misc (Lancets) .... Use With Your Insulin As Instructed 10)  Ventolin Hfa 108 (90 Base) Mcg/act Aers (Albuterol Sulfate) .... Take 1-2 Puffs Every 4-6 Hours As Needed For Shortness of Breath 11)  Clotrimazole 1 % Crea (Clotrimazole) .... Apply To Affected Areas Two Times A Day. 12)  Hydrocortisone 1 % Crea (Hydrocortisone) .... Apply To  Affected Areas Two Times A Day.  Allergies: No Known Drug Allergies  Review of Systems       per hpi  Physical Exam  General:  alert and well-developed.   Mouth:  pharynx pink and moist and no lesions.   Lungs:  normal respiratory effort and normal breath sounds.   Heart:  normal rate, regular rhythm, and Grade   3/6 systolic ejection murmur.   Extremities:  no edema Neurologic:  alert & oriented X3, cranial nerves II-XII intact, strength normal in all extremities, sensation intact to light touch, and gait normal.   Skin:  hypertrophied skin over most extensor fingers of both hands.  Does not extend over the hand or onto the plam.  skin is thick, dark, flakey and cracked.  The pinky and ring finger of the  left hand are deformed-with inlfexible joints which is old.  no other skin lesions. Psych:  Oriented X3, memory intact for recent and remote, normally interactive, and good eye contact.     Impression & Recommendations:  Problem # 1:  SKIN RASH (ICD-782.1) Hypertrophic skin over extensor surface of fingers, some flaking on the finger webs.  Skin is cracking and painful.  No obvious risk factors for conditions such as scabes.  No other lesions or itching anywhere on the body, including the feet.  Has tried vasoline, hydrocortisone cream, lotions nothing helps.  There is no sign of infection. This is liimiting her daily function because it hurts to use her hands. Wwill rec antifungal cream and hydrocortisone in case of tenia infection and will refer to derm.  Orders: Dermatology Referral (Derma)  Her updated medication list for this problem includes:    Clotrimazole 1 % Crea (Clotrimazole) .Marland Kitchen... Apply to affected areas two times a day.    Hydrocortisone 1 % Crea (Hydrocortisone) .Marland Kitchen... Apply to affected areas two times a day.  Problem # 2:  DIABETES MELLITUS, TYPE II (ICD-250.00) Assessment: Unchanged  taking lantus 25 units at home. cbgs always good at home 80-110. A1C today is 7.5, pretty good control no changes.  encouraged exercise.  Her updated medication list for this problem includes:    Lisinopril 20 Mg Tabs (Lisinopril) .Marland Kitchen... Take 1 tablet by mouth once a day    Lantus 100 Unit/ml Soln (Insulin glargine) ..... Inject 25 units subcutaneously in the morning with breakfast  Orders: T- Capillary Blood Glucose (04540) T-Hgb A1C (in-house) ([id number])  Labs Reviewed: Creat: 1.14 (11/21/2008)     Last Eye Exam: Mild non-proliferative diabetic retinopathy.  OU   (11/30/2008) Reviewed HgBA1c results: 7.1 (09/25/2008)  8.2 (07/16/2008)  Labs Reviewed: Creat: 1.14 (11/21/2008)     Last Eye Exam: Mild non-proliferative diabetic retinopathy.  OU   (11/30/2008) Reviewed HgBA1c  results: 7.5 (03/15/2009)  7.1 (09/25/2008)  Problem # 3:  HYPERTENSION (ICD-401.9) BP above goal.  will not change at this time as she is in pain and has been controled in the past.  Her updated medication list for this problem includes:    Lisinopril 20 Mg Tabs (Lisinopril) .Marland Kitchen... Take 1 tablet by mouth once a day  BP today: 140/68 Prior BP: 128/68 (11/12/2008)  Labs Reviewed: K+: 5.3 (11/21/2008) Creat: : 1.14 (11/21/2008)   Chol: 126 (05/22/2008)   HDL: 58 (05/22/2008)   LDL: 39 (05/22/2008)   TG: 145 (05/22/2008)  Problem # 4:  TOBACCO ABUSE (ICD-305.1) still smoking.  wants to pray about it first. will call us if she needs help.  Complete Medication List: 1)  Creon 24000  Unit Cpep (Pancrelipase (lip-prot-amyl)) .Marland Kitchen.. 1-3 tablets with meals and snacks 2)  Lisinopril 20 Mg Tabs (Lisinopril) .... Take 1 tablet by mouth once a day 3)  Lantus 100 Unit/ml Soln (Insulin glargine) .... Inject 25 units subcutaneously in the morning with breakfast 4)  Accuneb 0.63 Mg/61ml Nebu (Albuterol sulfate) .... 2 puff prn 5)  Anamantle Hc 3-0.5 % Crea (Lidocaine-hydrocortisone ace) .... Apply to external hemmorhoids two times a day for burning and pain 6)  Voltaren 1 % Gel (Diclofenac sodium) .... Apply 2-4 g of gel to affected areas every 8 hours as needed for pain. 7)  Prodigy Autocode Blood Glucose Strp (Glucose blood) .... Use to check your blood sugar 3 times daily 8)  Prodigy Insulin Syringe 31g X 5/16" 0.3 Ml Misc (Insulin syringe-needle u-100) .... Use to inject your insulin once daily 9)  Prodigy Twist Top Lancets 28g Misc (Lancets) .... Use with your insulin as instructed 10)  Ventolin Hfa 108 (90 Base) Mcg/act Aers (Albuterol sulfate) .... Take 1-2 puffs every 4-6 hours as needed for shortness of breath 11)  Clotrimazole 1 % Crea (Clotrimazole) .... Apply to affected areas two times a day. 12)  Hydrocortisone 1 % Crea (Hydrocortisone) .... Apply to affected areas two times a  day.  Patient Instructions: 1)  You have new prescriptions for 2 creams for your fingers. 2)  You will be scheduled with a dermatologist. 3)  Please schedule a follow-up appointment in 3 months. 4)  Continue to take your medications as prescribed. Prescriptions: HYDROCORTISONE 1 % CREA (HYDROCORTISONE) Apply to affected areas two times a day.  #15g x 0   Entered and Authorized by:   Elby Showers MD   Signed by:   Elby Showers MD on 03/15/2009   Method used:   Electronically to        [facility] (retail)       [address]       [place], [place]  [id number]       Ph: [phone]       Fax: [phone]   RxID:   [id number] CLOTRIMAZOLE 1 % CREA (CLOTRIMAZOLE) Apply to affected areas two times a day.  #15g x 0   Entered and Authorized by:   Elby Showers MD   Signed by:   Elby Showers MD on 03/15/2009   Method used:   Electronically to        [facility] (retail)       [address]       [place], [place]  [id number]       Ph: [phone]       Fax: [phone]   RxID:   [id number]    Prevention & Chronic Care Immunizations   Influenza vaccine: Not documented   Influenza vaccine deferral: Deferred  (09/25/2008)    Tetanus booster: Not documented   Td booster deferral: Deferred  (09/25/2008)    Pneumococcal vaccine: Not documented   Pneumococcal vaccine deferral: Deferred  (09/25/2008)  Colorectal Screening   Hemoccult: Not documented    Colonoscopy: Normal  (09/07/2005)  Other Screening   Pap smear: Normal  (08/27/2004)    Mammogram: ASSESSMENT: Negative - BI-RADS 1^MM DIGITAL SCREENING  (02/28/2008)   Mammogram due: 02/27/2009   Smoking status: current  (03/15/2009)   Smoking cessation counseling: yes  (03/15/2009)  Diabetes Mellitus   HgbA1C: 7.5  (03/15/2009)   Hemoglobin A1C due: 12/26/2008    Eye exam: Mild non-proliferative diabetic retinopathy.  OU    (  11/30/2008)    Foot  exam: yes  (09/25/2008)   Foot exam action/deferral: Do today   High risk foot: Yes  (09/25/2008)   Foot care education: Done  (09/25/2008)    Urine microalbumin/creatinine ratio: 130.1  (07/16/2008)    Diabetes flowsheet reviewed?: Yes   Progress toward A1C goal: Unchanged  Lipids   Total Cholesterol: 126  (05/22/2008)   LDL: 39  (05/22/2008)   LDL Direct: Not documented   HDL: 58  (05/22/2008)   Triglycerides: 145  (05/22/2008)   Lipid panel due: 09/25/2009  Hypertension   Last Blood Pressure: 140 / 68  (03/15/2009)   Serum creatinine: 1.14  (11/21/2008)   Serum potassium 5.3  (11/21/2008)   Basic metabolic panel due: 10/26/2008    Hypertension flowsheet reviewed?: Yes   Progress toward BP goal: Deteriorated  Self-Management Support :   Personal Goals (by the next clinic visit) :     Personal A1C goal: 7  (09/25/2008)     Personal blood pressure goal: 130/80  (09/25/2008)     Personal LDL goal: 70  (09/25/2008)    Patient will work on the following items until the next clinic visit to reach self-care goals:     Medications and monitoring: take my medicines every day, check my blood sugar, check my blood pressure, weigh myself weekly, examine my feet every day  (03/15/2009)     Eating: drink diet soda or water instead of juice or soda, eat more vegetables, use fresh or frozen vegetables, eat baked foods instead of fried foods, eat fruit for snacks and desserts  (03/15/2009)     Activity: take a 30 minute walk every day, park at the far end of the parking lot  (03/15/2009)    Diabetes self-management support: Written self-care plan  (09/25/2008)    Hypertension self-management support: Written self-care plan  (09/25/2008)  Laboratory Results   Blood Tests   Date/Time Received: March 15, 2009 1:56 PM Date/Time Reported: Alric Quan  March 15, 2009 1:56 PM  HGBA1C: 7.5%   (Normal Range: Non-Diabetic - 3-6%   Control Diabetic - 6-8%) CBG Random:: 87mg /dL

## 2010-03-27 NOTE — Progress Notes (Signed)
Summary: phone/gg  Phone Note Call from Patient   Caller: Patient Summary of Call: Pt called with c/o Stuff coming out of her breast.  She was seen on 11/17 for same but no d/c at that time. Appoiintment given for tomorrow AM to evaluate and see if antibiotics are needed   Mamie is setting up appointment for mammogram. Initial call taken by: Merrie Roof RN,  January 14, 2010 12:10 PM  Follow-up for Phone Call        agree with plan Follow-up by: Julaine Fusi  DO,  January 14, 2010 2:49 PM  Additional Follow-up for Phone Call Additional follow up Details #1::        Appt Scheduled. WEDNESDAY NOVEMBER 30,2011 AT 10:30 [facility]. Additional Follow-up by: [facility],  January 14, 2010 4:48 PM

## 2010-03-27 NOTE — Progress Notes (Signed)
Summary: Medication change  Phone Note Refill Request Message from:  Fax from Pharmacy on Jul 16, 2009 9:01 AM  Refills Requested: Medication #1:  ANAMANTLE HC 3-0.5 %  CREA Apply to external hemmorhoids two times a day for burning and pain No longer available from [facility].  Pt needs something else.   Method Requested: Electronic Initial call taken by: Angelina Ok RN,  Jul 16, 2009 9:02 AM  Follow-up for Phone Call        Dr Daiva Eves can you prescribe something for Ms. Krus?  She has called several times. Follow-up by: Chinita Pester RN,  Jul 19, 2009 3:35 PM  Additional Follow-up for Phone Call Additional follow up Details #1::        I have sent in the generic version of the same drug, hopefully this is available. Additional Follow-up by: Acey Lav MD,  Jul 19, 2009 3:49 PM    Additional Follow-up for Phone Call Additional follow up Details #2::    Generic and brand names not  available.  Lidocaine-Hydrocortisone 3-1% is availabe; pt. has been using 3-0.5%. Follow-up by: Chinita Pester RN,  Jul 19, 2009 4:08 PM  New/Updated Medications: LIDOCAINE-HYDROCORTISONE ACE 3-0.5 % CREA (LIDOCAINE-HYDROCORTISONE ACE) apply to hemorrhoids twice daily LIDOCAINE-HYDROCORTISONE ACE 3-1 % KIT (LIDOCAINE-HYDROCORTISONE ACE) apply to area two times a day Prescriptions: LIDOCAINE-HYDROCORTISONE ACE 3-1 % KIT (LIDOCAINE-HYDROCORTISONE ACE) apply to area two times a day  #30 x 1   Entered and Authorized by:   Acey Lav MD   Signed by:   Paulette Blanch Dam MD on 07/19/2009   Method used:   Electronically to        [facility] (retail)       [address]       [place], [place]  [id number]       Ph: [phone]       Fax: [phone]   RxID:   [id number] LIDOCAINE-HYDROCORTISONE ACE 3-0.5 % CREA (LIDOCAINE-HYDROCORTISONE ACE) apply to hemorrhoids twice daily  #30 x 2   Entered and Authorized by:   Acey Lav MD   Signed by:   Paulette Blanch Dam MD on 07/19/2009   Method used:   Electronically to        [facility] (retail)       [address]       [place], [place]  [id number]       Ph: [phone]       Fax: [phone]   RxID:   [id number]   Appended Document: Medication change Pt.was called and made awared of  new Rx .

## 2010-03-27 NOTE — Progress Notes (Signed)
Summary: refill/gg  Phone Note Refill Request  on August 28, 2009 3:34 PM  Refills Requested: Medication #1:  VENTOLIN HFA 108 (90 BASE) MCG/ACT AERS Take 1-2 puffs every 4-6 hours as needed for shortness of breath   Last Refilled: 07/01/2009  Method Requested: Electronic Initial call taken by: Merrie Roof RN,  August 28, 2009 3:34 PM    Prescriptions: TRAMADOL HCL 50 MG TABS (TRAMADOL HCL) Take 1 tablet by mouth three times a day as needed for pain.  #90 x 2   Entered and Authorized by:   Bethel Born MD   Signed by:   Bethel Born MD on 08/29/2009   Method used:   Electronically to        [facility] (retail)       [address]       [place], [place]  [address]       Ph: [phone]       Fax: [phone]   RxID:   [id number]

## 2010-03-27 NOTE — Progress Notes (Signed)
Summary: diabetes support/dmr  ---- Converted from flag ---- ---- 09/24/2009 5:25 PM, Bethel Born MD wrote: sure, thats fine thanks  ---- 09/24/2009 4:24 PM, Jamison Neighbor RD,CDE wrote: prefers to take meds in morning- can she take the amlodipine in the morning? ------------------------------  Phone Note Outgoing Call   Call placed by: Jamison Neighbor RD,CDE,  October 02, 2009 2:04 PM Summary of Call: called patient and relayed that her doctor said it would be okay to take amlodipine in the morning. Also set up follow up appointment with CDE (patient to test CBgs before and 2 hour after meals x 24 hours)    New/Updated Medications: AMLODIPINE BESYLATE 5 MG TABS (AMLODIPINE BESYLATE) take 1 tab daily

## 2010-03-27 NOTE — Progress Notes (Signed)
  Phone Note Outgoing Call   Call placed by: Blanch Media MD,  August 09, 2009 12:09 PM Call placed to: Patient Summary of Call: Inocencio Homes asked me to review note.  Dr Onalee Hua found ARF on 08/06/09.  Asked pt to stop Lisinopril and come in for BMP on the 17th.  I called pt.  She doesn't remember being asked to stop lisinopril.  She knows the med "My BP pill" and will stop it.  Pt cannot come in for BMP until the 21st - transportation issues.  Pt asymptomatic x constipation so I asked her to get OTC stool softner or laxative to try first.  Pt understands.    Follow-up for Phone Call        Gaye, would you pls schedule lab appt for pt on the 21st?  Thanls Follow-up by: Blanch Media MD,  August 09, 2009 1:36 PM  Additional Follow-up for Phone Call Additional follow up Details #1::        Lab has been scheduled Additional Follow-up by: Merrie Roof RN,  August 09, 2009 1:48 PM     Process Orders Check Orders Results:     [facility]: ABN not required for this insurance Tests Sent for requisitioning (August 09, 2009 1:47 PM):     08/09/2009: [facility] -- T-Renal Function Panel [phone] (signed)

## 2010-03-27 NOTE — Progress Notes (Signed)
Summary: Refill/gh  Phone Note Refill Request Message from:  Fax from Pharmacy on September 19, 2009 2:19 PM  Refills Requested: Medication #1:  AMLODIPINE BESYLATE 5 MG TABS take 1 tab at night daily   Dosage confirmed as above?Dosage Confirmed   Last Refilled: 08/15/2009  Method Requested: Electronic Initial call taken by: Angelina Ok RN,  September 19, 2009 2:20 PM  Follow-up for Phone Call       Follow-up by: Bethel Born MD,  September 19, 2009 2:58 PM    Prescriptions: AMLODIPINE BESYLATE 5 MG TABS (AMLODIPINE BESYLATE) take 1 tab at night daily  #30 x 6   Entered and Authorized by:   Bethel Born MD   Signed by:   Bethel Born MD on 09/19/2009   Method used:   Electronically to        [facility] (retail)       [address]       [place], [place]  [address]       Ph: [phone]       Fax: [phone]   RxID:   [id number]

## 2010-03-27 NOTE — Letter (Signed)
[facility]   Imported By: Margie Billet 11/13/2009 16:55:39  _____________________________________________________________________  External Attachment:    Type:   Image     Comment:   External Document

## 2010-03-27 NOTE — Progress Notes (Signed)
Summary: Refill/gh  Phone Note Refill Request Message from:  Fax from Pharmacy on Jul 15, 2009 10:33 AM  Refills Requested: Medication #1:  ANAMANTLE HC 3-0.5 %  CREA Apply to external hemmorhoids two times a day for burning and pain   Last Refilled: 05/06/2009  Method Requested: Electronic Initial call taken by: Angelina Ok RN,  Jul 15, 2009 10:34 AM  Follow-up for Phone Call       Follow-up by: Silvestre Gunner MD,  Jul 15, 2009 1:49 PM    Prescriptions: ANAMANTLE HC 3-0.5 %  CREA (LIDOCAINE-HYDROCORTISONE ACE) Apply to external hemmorhoids two times a day for burning and pain  #1 tube x 5   Entered and Authorized by:   Silvestre Gunner MD   Signed by:   Silvestre Gunner MD on 07/15/2009   Method used:   Electronically to        [facility] (retail)       [address]       [place], [place]  [address]       Ph: [phone]       Fax: [phone]   RxID:   [id number]

## 2010-03-27 NOTE — Progress Notes (Signed)
Summary: refill/ hla  Phone Note Refill Request Message from:  Fax from Pharmacy on April 25, 2009 5:44 PM  Refills Requested: Medication #1:  LANTUS 100 UNIT/ML SOLN Inject 25 units subcutaneously in the morning with breakfast   Last Refilled: 1/8 last visit 1/21, last labs 9/29  Initial call taken by: Marin Roberts RN,  April 25, 2009 5:45 PM    Prescriptions: LANTUS 100 UNIT/ML SOLN (INSULIN GLARGINE) Inject 25 units subcutaneously in the morning with breakfast  #1 mo supply x 6   Entered and Authorized by:   Silvestre Gunner MD   Signed by:   Silvestre Gunner MD on 04/26/2009   Method used:   Electronically to        [facility] (retail)       [address]       [place], [place]  [address]       Ph: [phone]       Fax: [phone]   RxID:   [id number]

## 2010-03-27 NOTE — Letter (Signed)
[facility]   Imported By: Margie Billet 02/11/2010 10:58:56  _____________________________________________________________________  External Attachment:    Type:   Image     Comment:   External Document

## 2010-03-27 NOTE — Assessment & Plan Note (Signed)
Summary: diabetes teaching per gladys for 1 month   Allergies: No Known Drug Allergies   Complete Medication List: 1)  Creon 24000 Unit Cpep (Pancrelipase (lip-prot-amyl)) .Marland Kitchen.. 1-3 tablets with meals and snacks 2)  Lantus 100 Unit/ml Soln (Insulin glargine) .... Inject 25 units subcutaneously in the morning with breakfast 3)  Accuneb 0.63 Mg/50ml Nebu (Albuterol sulfate) .... 2 puff prn 4)  Prodigy Autocode Blood Glucose Strp (Glucose blood) .... Use to check your blood sugar 3 times daily 5)  Prodigy Insulin Syringe 31g X 5/16" 0.3 Ml Misc (Insulin syringe-needle u-100) .... Use to inject your insulin once daily 6)  Prodigy Twist Top Lancets 28g Misc (Lancets) .... Use with your insulin as instructed 7)  Ventolin Hfa 108 (90 Base) Mcg/act Aers (Albuterol sulfate) .... Take 1-2 puffs every 4-6 hours as needed for shortness of breath 8)  Tramadol Hcl 50 Mg Tabs (Tramadol hcl) .... Take 1 tablet by mouth three times a day as needed for pain. 9)  Amlodipine Besylate 5 Mg Tabs (Amlodipine besylate) .... Take 1 tab at night daily 10)  Miralax Powd (Polyethylene glycol 3350) .... Mix with water and drink once o twice a day for constipation 11)  Proctofoam 1 % Foam (Pramoxine hcl) .... Twice a day for itching and pain  Other Orders: DSMT (Medicaid) 60 Minutes [phone])  Laboratory Results   Blood Tests   Date/Time Received: September 24, 2009 4:14 PM Date/Time Reported: Alric Quan  September 24, 2009 4:14 PM    HGBA1C: 7.6%   (Normal Range: Non-Diabetic - 3-6%   Control Diabetic - 6-8%) CBG Random:: 72mg /dL CBG Fasting:: Before Breakfastmg/dL      Diabetes Self Management Training  PCP: Bethel Born MD Date diagnosed with diabetes: 02/23/1994 Diabetes Type: Type 2 Diabetes Mellitus Current smoking Status: current     Assessment Work Hours: Not currently working Daily activities: watches 60 year old granddaughter, used to do housekeeping for employment amidst other jobs Sources  of Support: friend has diabetes Years of school completed: Some High school Affect: Appropriate  Potential Barriers  Economic/Supplies  Coping with Diabetes Would you say you are: happy What bothers you  most about dealing with your diabetes? can't eat the sweets i want How does diabetes make your life hard? it doesn't   Diabetes Medications:  Comments: has been taking 28 units every morning since her last doctor visit. Blood sugar this am was 72, she ate and it went up to 108, then took lantus and it went back down to 72 when she got here " that's how the lantus does me"     Long Acting  Insulin Type:Lantus Breakfast Dose: 25 units    Monitoring Self monitoring blood glucose 3 times a day Name of Meter  Prodigy Autocode  Time of Testing  Before Breakfast     Estimated /Usual Carb Intake Breakfast # of Carbs/Grams oatmeal, grits,  boiled eggs slice of bacon Lunch # of Carbs/Grams cabbage, beef neck bones,  cut back on bread, banquet dinner with creamed potatoes, meat with cheese Dinner # of Carbs/Grams stewed chicken or [place], rice or instant   Nutrition assessment Weight change: patient says she hasn;t gained weight, but per chart weight increased 34.7 # in past 3 years. ETOH : Yes Amount per day: every now and  then a  budweiser How many meals per week do you eat away from home?   rarely eat away from home What beverages do you drink?  regular koolaid with  sugar sub, coffee occassionally, sprite Biggest challenge to eating healthy: knowing what to eat and not being afraid to eat foods that contain potassium Diabetes Disease Process  Discussed today State own type of diabetes: Demonstrates competency Medications State name-action-dose-duration-side effects-and time to take medication: Demonstrates competencyState appropriate timing of food related to medication: Demonstrates competencyDemonstrates/verbalizes site selection and rotation for injections Needs  review/assistance    Nutritional Management Identify what foods most often affect blood glucose: Needs review/assistance    Verbalize importance of controlling food portions: Demonstrates competency   State importance of spacing and not omitting meals and snacks: Demonstrates competency   State changes planned for home meals/snacks: Needs review/assistance    Monitoring State target blood glucose and HgbA1C goals: Needs review/assistance    Complications State the causes-signs and symptoms and prevention of Hyperglycemia: Needs review/assistance   Explain proper treatment of hyperglycemia: Needs review/assistanceState the causes- signs and symptoms and prevention of hypoglycemia: Needs review/assistanceExplain proper treatment of hypoglycemia: Needs review/assistanceGlucose control and the development/prevention of long-term complications: Demonstrates competencyState benefits-risks-and options for improving blood sugar control: Demonstrates competency Exercise  Lifestyle changes:Goal setting and Problem solving State benefits of making appropriate lifestyle changes: Demonstrates competencyIdentify lifestyle behaviors that need to change: Needs review/assistanceIdentify risk factors that interfere with health: Needs review/assistanceDevelop strategies to reduce risk factors: Needs review/assistance   Verbalize need for and frequency of health care follow-up: Needs review/assistanceDiabetes Management Education Done: 09/24/2009    BEHAVIORAL GOALS INITIAL Incorporating appropriate nutritional management: hang list of foods and # of servings she should eat ina day on her refrigerator        Diabetes Self Management Support: friend and clinic staff Follow-up: 1 month to follow up on education plan

## 2010-03-27 NOTE — Assessment & Plan Note (Signed)
Summary: eEST-CK/FU/MEDS/CFB   Vital Signs:  Patient profile:   60 year old female Height:      66.5 inches (168.91 cm) Weight:      176.01 pounds (80.00 kg) BMI:     28.08 Temp:     98.6 degrees F (37.00 degrees C) oral Pulse rate:   83 / minute BP sitting:   149 / 70  (left arm)  Vitals Entered By: Angelina Ok RN (August 27, 2009 2:35 PM) Is Patient Diabetic? Yes Did you bring your meter with you today? No Nutritional Status BMI of 25 - 29 = overweight  Have you ever been in a relationship where you felt threatened, hurt or afraid?No  Comments Went to the ER.  Problems with bowel movement.  Needs something to regulate her bowel movements.  Labs   Primary Care Provider:  Bethel Born MD   History of Present Illness:  The patient is a 60 year old  woman with past medical history of diabetes type 2 for 15 years on   insulin since last 8 years, hypertension, and chronic renal  insufficiency with baseline creatinine of 1.2-1.4. admitted on 6/23 for ARF., discharged with creatinine of 1.48. Went to the ED 1 weeek later for constipatin  no complaints today compliant with meds continues to smoke no refills needed   Depression History:      The patient denies a depressed mood most of the day and a diminished interest in her usual daily activities.         Preventive Screening-Counseling & Management  Alcohol-Tobacco     Alcohol type: beer occasionally     Smoking Status: current     Smoking Cessation Counseling: yes     Packs/Day: 1/2 ppd     Year Started: at age of 43  Current Medications (verified): 1)  Creon 24000 Unit Cpep (Pancrelipase (Lip-Prot-Amyl)) .Marland Kitchen.. 1-3 Tablets With Meals and Snacks 2)  Lantus 100 Unit/ml Soln (Insulin Glargine) .... Inject 25 Units Subcutaneously in The Morning With Breakfast 3)  Accuneb 0.63 Mg/80ml  Nebu (Albuterol Sulfate) .... 2 Puff Prn 4)  Prodigy Autocode Blood Glucose  Strp (Glucose Blood) .... Use To Check Your Blood Sugar 3 Times  Daily 5)  Prodigy Insulin Syringe 31g X 5/16" 0.3 Ml Misc (Insulin Syringe-Needle U-100) .... Use To Inject Your Insulin Once Daily 6)  Prodigy Twist Top Lancets 28g  Misc (Lancets) .... Use With Your Insulin As Instructed 7)  Ventolin Hfa 108 (90 Base) Mcg/act Aers (Albuterol Sulfate) .... Take 1-2 Puffs Every 4-6 Hours As Needed For Shortness of Breath 8)  Tramadol Hcl 50 Mg Tabs (Tramadol Hcl) .... Take 1 Tablet By Mouth Three Times A Day As Needed For Pain. 9)  Amlodipine Besylate 5 Mg Tabs (Amlodipine Besylate) .... Take 1 Tab At Night Daily 10)  Miralax  Powd (Polyethylene Glycol 3350) .... Mix With Water and Drink Once O Twice A Day For Constipation  Allergies (verified): No Known Drug Allergies  Review of Systems  The patient denies anorexia, fever, weight loss, weight gain, vision loss, decreased hearing, hoarseness, chest pain, syncope, dyspnea on exertion, peripheral edema, prolonged cough, headaches, hemoptysis, abdominal pain, melena, hematochezia, severe indigestion/heartburn, hematuria, incontinence, genital sores, muscle weakness, suspicious skin lesions, transient blindness, difficulty walking, depression, unusual weight change, abnormal bleeding, enlarged lymph nodes, angioedema, breast masses, and testicular masses.    Physical Exam  General:  alert, well-developed, and well-nourished.   Neck:  supple.   Lungs:  normal respiratory effort, no intercostal  retractions, no accessory muscle use, and normal breath sounds.   Heart:  normal rate, regular rhythm, and no murmur.   Abdomen:  soft, non-tender, normal bowel sounds, and no distention.   Pulses:  dorsalis pedis pulses normal bilaterally  Extremities:  . Neurologic:  OrientedX3, cranial nerver 2-12 intact,strength good in all extremities, sensations normal to light touch, reflexes 2+ b/l, gait normal    Impression & Recommendations:  Problem # 1:  HYPERKALEMIA (ICD-276.7) rechecking BMET./ wil follow labs  result  Orders: T-Basic Metabolic Panel [phone])  * K+ 4.5 Recheck in 1 month  Problem # 2:  RENAL INSUFFICIENCY (ICD-588.9) will check BMET  * creatinine  1.43 (down from 1.48 at d/c). improving  Problem # 3:  HYPERTENSION (ICD-401.9) well controlled. no change in meds  Her updated medication list for this problem includes:    Amlodipine Besylate 5 Mg Tabs (Amlodipine besylate) .Marland Kitchen... Take 1 tab at night daily  Problem # 4:  CONSTIPATION (ICD-564.00) came to the Ed a week ago, wan impacted, had disampaction. Not constipated anymore.  May be because of CCB, but had some constipation for yrs Will continue CCB, will give miralax, will change to a BB if constiaption worsenes .Advise to eat more fiber and fluids   Her updated medication list for this problem includes:    Miralax Powd (Polyethylene glycol 3350) ..... Mix with water and drink once o twice a day for constipation  Problem # 5:  Preventive Health Care (ICD-V70.0) flu shot next season penumovac and Tdap today had colonsocpy in 2007 which was normal except hemorrhoids. next in 2017 PAP refused today, will consider at next visit  Problem # 6:  DIABETES MELLITUS, TYPE II, ON INSULIN (ICD-250.00)  conitnue lantus. Patient not brought meter today. Will check HBa1C in a month  Her updated medication list for this problem includes:    Lantus 100 Unit/ml Soln (Insulin glargine) ..... Inject 25 units subcutaneously in the morning with breakfast  Orders: Tdap => 32yrs IM (09811) Admin 1st Vaccine (91478) Pneumococcal Vaccine (29562) Admin of Any Addtl Vaccine (13086)  Complete Medication List: 1)  Creon 24000 Unit Cpep (Pancrelipase (lip-prot-amyl)) .Marland Kitchen.. 1-3 tablets with meals and snacks 2)  Lantus 100 Unit/ml Soln (Insulin glargine) .... Inject 25 units subcutaneously in the morning with breakfast 3)  Accuneb 0.63 Mg/66ml Nebu (Albuterol sulfate) .... 2 puff prn 4)  Prodigy Autocode Blood Glucose Strp (Glucose blood)  .... Use to check your blood sugar 3 times daily 5)  Prodigy Insulin Syringe 31g X 5/16" 0.3 Ml Misc (Insulin syringe-needle u-100) .... Use to inject your insulin once daily 6)  Prodigy Twist Top Lancets 28g Misc (Lancets) .... Use with your insulin as instructed 7)  Ventolin Hfa 108 (90 Base) Mcg/act Aers (Albuterol sulfate) .... Take 1-2 puffs every 4-6 hours as needed for shortness of breath 8)  Tramadol Hcl 50 Mg Tabs (Tramadol hcl) .... Take 1 tablet by mouth three times a day as needed for pain. 9)  Amlodipine Besylate 5 Mg Tabs (Amlodipine besylate) .... Take 1 tab at night daily 10)  Miralax Powd (Polyethylene glycol 3350) .... Mix with water and drink once o twice a day for constipation  Patient Instructions: 1)  IF you feel consitpated most of the time, call me in a week and I will change your medicine 2)  Eat more fiber food and drink enough water 3)  Tobacco is very bad for your health and your loved ones! You Should stop smoking!Marland Kitchen 4)  Stop Smoking Tips: Choose a Quit date. Cut down before the Quit date. decide what you will do as a substitute when you feel the urge to smoke(gum,toothpick,exercise). 5)  Please schedule a follow-up appointment in 1 month Prescriptions: MIRALAX  POWD (POLYETHYLENE GLYCOL 3350) mix with water and drink once o twice a day for constipation  #3 x 3   Entered and Authorized by:   Bethel Born MD   Signed by:   Bethel Born MD on 08/27/2009   Method used:   Electronically to        [facility] (retail)       [address]       [place], [place]  [address]       Ph: [phone]       Fax: [phone]   RxID:   [id number]   Prevention & Chronic Care Immunizations   Influenza vaccine: Not documented   Influenza vaccine deferral: Deferred  (08/27/2009)    Tetanus booster: 08/27/2009: Tdap   Td booster deferral: Deferred  (09/25/2008)    Pneumococcal vaccine: Pneumovax  (08/27/2009)   Pneumococcal vaccine  deferral: Deferred  (09/25/2008)  Colorectal Screening   Hemoccult: Not documented   Hemoccult action/deferral: Deferred  (08/27/2009)    Colonoscopy: Normal  (09/07/2005)   Colonoscopy action/deferral: GI referral  (08/27/2009)  Other Screening   Pap smear: Normal  (08/27/2004)   Pap smear action/deferral: Deferred  (08/06/2009)    Mammogram: ASSESSMENT: Negative - BI-RADS 1^MM DIGITAL SCREENING  (02/28/2008)   Mammogram action/deferral: Ordered  (08/06/2009)   Mammogram due: 02/27/2009   Smoking status: current  (08/27/2009)   Smoking cessation counseling: yes  (08/27/2009)  Diabetes Mellitus   HgbA1C: 8.1  (07/12/2009)   Hemoglobin A1C due: 12/26/2008    Eye exam: Mild non-proliferative diabetic retinopathy.  OU    (11/30/2008)    Foot exam: yes  (09/25/2008)   Foot exam action/deferral: Do today   High risk foot: Yes  (09/25/2008)   Foot care education: Done  (09/25/2008)    Urine microalbumin/creatinine ratio: 130.1  (07/16/2008)   Urine microalbumin action/deferral: Not indicated    Diabetes flowsheet reviewed?: Yes   Progress toward A1C goal: Deteriorated  Lipids   Total Cholesterol: 126  (05/22/2008)   Lipid panel action/deferral: Deferred   LDL: 39  (05/22/2008)   LDL Direct: Not documented   HDL: 58  (05/22/2008)   Triglycerides: 145  (05/22/2008)   Lipid panel due: 09/25/2009  Hypertension   Last Blood Pressure: 149 / 70  (08/27/2009)   Serum creatinine: 2.39  (08/13/2009)   BMP action: Ordered   Serum potassium 6.2  (08/13/2009)   Basic metabolic panel due: 10/26/2008    Hypertension flowsheet reviewed?: Yes   Progress toward BP goal: Deteriorated  Self-Management Support :   Personal Goals (by the next clinic visit) :     Personal A1C goal: 7  (09/25/2008)     Personal blood pressure goal: 130/80  (09/25/2008)     Personal LDL goal: 70  (09/25/2008)    Patient will work on the following items until the next clinic visit to reach self-care  goals:     Medications and monitoring: take my medicines every day, bring all of my medications to every visit, examine my feet every day  (08/27/2009)     Eating: drink diet soda or water instead of juice or soda, eat more vegetables, use fresh or frozen vegetables, eat foods that are low in salt, eat baked foods instead  of fried foods, eat fruit for snacks and desserts, limit or avoid alcohol  (08/27/2009)     Activity: take a 30 minute walk every day  (08/27/2009)    Diabetes self-management support: Written self-care plan, Education handout, Pre-printed educational material, Resources for patients handout  (08/27/2009)   Diabetes care plan printed   Diabetes education handout printed    Hypertension self-management support: Written self-care plan, Education handout, Pre-printed educational material, Resources for patients handout  (08/27/2009)   Hypertension self-care plan printed.   Hypertension education handout printed      Resource handout printed.   Nursing Instructions: Give tetanus booster today Give Pneumovax today GI referral for screening colonoscopy (see order)   Process Orders Check Orders Results:     [facility]: ABN not required for this insurance Tests Sent for requisitioning (August 27, 2009 4:29 PM):     08/27/2009: [facility] -- T-Basic Metabolic Panel [phone] (signed)     Vital Signs:  Patient profile:   60 year old female Height:      66.5 inches (168.91 cm) Weight:      176.01 pounds (80.00 kg) BMI:     28.08 Temp:     98.6 degrees F (37.00 degrees C) oral Pulse rate:   83 / minute BP sitting:   149 / 70  (left arm)  Vitals Entered By: Angelina Ok RN (August 27, 2009 2:35 PM)  Serial Vital Signs/Assessments:  Time      Position  BP       Pulse  Resp  Temp     By                     120/70                         Bethel Born MD    Immunizations Administered:  Tetanus Vaccine:    Vaccine Type: Tdap     Site: right deltoid    Mfr: Boostrix    Dose: 0.5 ml    Route: IM    Given by: Angelina Ok RN    Exp. Date: 05/18/2011    Lot #: [id number]    VIS given: 01/11/07 version given August 27, 2009.  Pneumonia Vaccine:    Vaccine Type: Pneumovax    Site: left deltoid    Mfr: Merck    Dose: 0.5 ml    Route: IM    Given by: Angelina Ok RN    Exp. Date: 02/01/2010    Lot #: [id number]    VIS given: 09/21/95 version given August 27, 2009.  Appended Document: eEST-CK/FU/MEDS/CFB Called the patient to schedule an appointment with Jamison Neighbor and also to self titrate her latus to 27-28 units for CBGs around 120-130;

## 2010-03-27 NOTE — Progress Notes (Signed)
Summary: Appointment  Phone Note Outgoing Call   Call placed by: Angelina Ok RN,  August 28, 2009 2:17 PM Call placed to: Patient Summary of Call: Call to pt to inform of need to see D. Victory Dakin for her Diabetes in 1 month.  Unable to reach pt . Angelina Ok RN  August 28, 2009 2:18 PM  Initial call taken by: Angelina Ok RN,  August 29, 2009 10:58 AM  Follow-up for Phone Call        Patient returned your phone call and sch an appointment with Jamison Neighbor for 09/25/2009 at 8:30am. Follow-up by: Shon Hough,  August 28, 2009 3:37 PM

## 2010-03-27 NOTE — Discharge Summary (Signed)
Summary: Hospital Discharge Update    Hospital Discharge Update:  Date of Admission: 08/14/2009 Date of Discharge: 08/15/2009  Brief Summary:  Hyperkalemia with K of 6.2 Repeat K 5.7 in ED with no EKG changes. Crt of 2.2  Labs needed at follow-up: Basic metabolic panel  Problem list changes:  Added new problem of HYPERKALEMIA (ICD-276.7)  Medication list changes:  Removed medication of BACTRIM DS 800-160 MG TABS (SULFAMETHOXAZOLE-TRIMETHOPRIM) Take 1 tablet by mouth two times a day Added new medication of AMLODIPINE BESYLATE 5 MG TABS (AMLODIPINE BESYLATE) take 1 tab at night daily - Signed Removed medication of LIDOCAINE-HYDROCORTISONE ACE 3-1 % KIT (LIDOCAINE-HYDROCORTISONE ACE) apply to area two times a day Rx of AMLODIPINE BESYLATE 5 MG TABS (AMLODIPINE BESYLATE) take 1 tab at night daily;  #30 x 0;  Signed;  Entered by: Lars Mage MD;  Authorized by: Lars Mage MD;  Method used: Electronically to [facility] [address], [place], [place]  [id number], Ph: [phone], Fax: [phone]  The medication, problem, and allergy lists have been updated.  Please see the dictated discharge summary for details.  Discharge medications:  CREON 24000 UNIT CPEP (PANCRELIPASE (LIP-PROT-AMYL)) 1-3 tablets with meals and snacks LANTUS 100 UNIT/ML SOLN (INSULIN GLARGINE) Inject 25 units subcutaneously in the morning with breakfast ACCUNEB 0.63 MG/3ML  NEBU (ALBUTEROL SULFATE) 2 puff PRN PRODIGY AUTOCODE BLOOD GLUCOSE  STRP (GLUCOSE BLOOD) Use to check your blood sugar 3 times daily PRODIGY INSULIN SYRINGE 31G X 5/16" 0.3 ML MISC (INSULIN SYRINGE-NEEDLE U-100) Use to inject your insulin once daily PRODIGY TWIST TOP LANCETS 28G  MISC (LANCETS) Use with your insulin as instructed VENTOLIN HFA 108 (90 BASE) MCG/ACT AERS (ALBUTEROL SULFATE) Take 1-2 puffs every 4-6 hours as needed for shortness of breath TRAMADOL HCL 50 MG TABS (TRAMADOL HCL) Take 1 tablet by mouth three  times a day as needed for pain. AMLODIPINE BESYLATE 5 MG TABS (AMLODIPINE BESYLATE) take 1 tab at night daily  Other patient instructions:  Follow up appointment with Dr Scot Dock on July 5th 2011 at 2.30 PM. Come for Bmet to labs for a blood draw on June 28th 2011 in the morning.  Note: Hospital Discharge Medications & Other Instructions handout was printed, one copy for patient and a second copy to be placed in hospital chart.

## 2010-03-27 NOTE — Assessment & Plan Note (Addendum)
Vital Signs:  Patient profile:   60 year old female Height:      66.5 inches Weight:      177.7 pounds BMI:     28.35 Temp:     97.6 degrees F oral Pulse rate:   81 / minute BP sitting:   129 / 72  (right arm)  Vitals Entered By: Filomena Jungling NT II (January 09, 2010 10:46 AM) CC: BURING IN BOTTOM, CHECK BREAST Is Patient Diabetic? Yes Did you bring your meter with you today? No Nutritional Status BMI of 25 - 29 = overweight CBG Result 95  Have you ever been in a relationship where you felt threatened, hurt or afraid?No   Does patient need assistance? Functional Status Self care Ambulation Normal   Diabetic Foot Exam Last Podiatry Exam Date: 09/25/2008 Foot Inspection Is there a history of a foot ulcer?              No Is there a foot ulcer now?              No Can the patient see the bottom of their feet?          Yes Are the shoes appropriate in style and fit?          Yes Is there swelling or an abnormal foot shape?          No Are the toenails long?                No Are the toenails thick?                No Are the toenails ingrown?              No Is there heavy callous build-up?              No Is there pain in the calf muscle (Intermittent claudication) when walking?    YesIs there a claw toe deformity?              No Is there elevated skin temperature?            No Is there limited ankle dorsiflexion?            No Is there foot or ankle muscle weakness?            No  Diabetic Foot Care Education Pulse Check          Right Foot          Left Foot Posterior Tibial:        normal            normal Dorsalis Pedis:        normal            normal Comments: callous on both feet is under DR. TUCHMAN CARE  High Risk Feet? No Set Next Diabetic Foot Exam here: 01/12/2011   10-g (5.07) Semmes-Weinstein Monofilament Test Performed by: Filomena Jungling NT II          Right Foot          Left Foot Site 1         normal         normal Site 2         normal          normal Site 3         normal         normal Site 4  normal         normal Site 5         normal         normal Site 6         normal         normal Site 7         normal         normal Site 8         normal         normal Site 9         normal         normal  Impression      normal         normal   Primary Care Provider:  Bethel Born MD  CC:  BURING IN BOTTOM and CHECK BREAST.  History of Present Illness: Pt  is a 60 yo AAF with PMH of DM, HTN, HLD and pancreatitis and hemorrhoids who came here for rectal itching and burning. These symptoms has been for 4 years and become worse recently. Her rectal supp seems not very helpful. She laso c/o about right breat skin some yellowish discharge, no fever. She has no other c/o, including fever, SOB or CP.    Preventive Screening-Counseling & Management  Alcohol-Tobacco     Alcohol type: beer occasionally     Smoking Status: current     Smoking Cessation Counseling: yes     Packs/Day: 1/2 ppd     Year Started: at age of 50  Caffeine-Diet-Exercise     Does Patient Exercise: no  Problems Prior to Update: 1)  Heart Murmur  (ICD-785.2) 2)  Hemorrhoids  (ICD-455.6) 3)  Degenerative Joint Disease  (ICD-715.90) 4)  Renal Insufficiency  (ICD-588.9) 5)  Osteoporosis  (ICD-733.00) 6)  Pancreatitis, Chronic  (ICD-577.1) 7)  Hypertension  (ICD-401.9) 8)  Ventricular Hypertrophy, Left  (ICD-429.3) 9)  Diabetes Mellitus, Type II, On Insulin  (ICD-250.00) 10)  Renal Insufficiency, Chronic  (ICD-585.9) 11)  Diabetic Peripheral Neuropathy  (ICD-250.60) 12)  Diabetic Retinopathy  (ICD-250.50) 13)  Hypercoagulable State, Primary  (ICD-289.81) 14)  Anemia, Chronic Disease Nec  (ICD-285.29)  Medications Prior to Update: 1)  Creon 24000 Unit Cpep (Pancrelipase (Lip-Prot-Amyl)) .Marland Kitchen.. 1-3 Tablets With Meals and Snacks 2)  Lantus 100 Unit/ml Soln (Insulin Glargine) .... Inject 25 Units Subcutaneously in The Morning With Breakfast 3)  Accuneb  0.63 Mg/75ml  Nebu (Albuterol Sulfate) .... 2 Puff Prn 4)  Prodigy Autocode Blood Glucose  Strp (Glucose Blood) .... Use To Check Your Blood Sugar 3 Times Daily 5)  Prodigy Insulin Syringe 31g X 5/16" 0.3 Ml Misc (Insulin Syringe-Needle U-100) .... Use To Inject Your Insulin Once Daily 6)  Prodigy Twist Top Lancets 28g  Misc (Lancets) .... Use With Your Insulin As Instructed 7)  Ventolin Hfa 108 (90 Base) Mcg/act Aers (Albuterol Sulfate) .... Take 1-2 Puffs Every 4-6 Hours As Needed For Shortness of Breath 8)  Tramadol Hcl 50 Mg Tabs (Tramadol Hcl) .... Take 1 Tablet By Mouth Three Times A Day As Needed For Pain. 9)  Amlodipine Besylate 5 Mg Tabs (Amlodipine Besylate) .... Take 1 Tab Daily 10)  Miralax  Powd (Polyethylene Glycol 3350) .... Mix With Water and Drink Once O Twice A Day For Constipation 11)  Anamantle Hc 3-0.5 % Kit (Lidocaine-Hydrocortisone Ace) .... Apply To Hemmorhoids 2 Times A Day For Burning and Pain  Current Medications (verified): 1)  Creon 24000 Unit Cpep (Pancrelipase (Lip-Prot-Amyl)) .Marland Kitchen.. 1-3 Tablets  With Meals and Snacks 2)  Lantus 100 Unit/ml Soln (Insulin Glargine) .... Inject 25 Units Subcutaneously in The Morning With Breakfast 3)  Accuneb 0.63 Mg/54ml  Nebu (Albuterol Sulfate) .... 2 Puff Prn 4)  Prodigy Autocode Blood Glucose  Strp (Glucose Blood) .... Use To Check Your Blood Sugar 3 Times Daily 5)  Prodigy Insulin Syringe 31g X 5/16" 0.3 Ml Misc (Insulin Syringe-Needle U-100) .... Use To Inject Your Insulin Once Daily 6)  Prodigy Twist Top Lancets 28g  Misc (Lancets) .... Use With Your Insulin As Instructed 7)  Ventolin Hfa 108 (90 Base) Mcg/act Aers (Albuterol Sulfate) .... Take 1-2 Puffs Every 4-6 Hours As Needed For Shortness of Breath 8)  Tramadol Hcl 50 Mg Tabs (Tramadol Hcl) .... Take 1 Tablet By Mouth Three Times A Day As Needed For Pain. 9)  Amlodipine Besylate 5 Mg Tabs (Amlodipine Besylate) .... Take 1 Tab Daily 10)  Miralax  Powd (Polyethylene Glycol 3350)  .... Mix With Water and Drink Once O Twice A Day For Constipation 11)  Anamantle Hc 3-0.5 % Kit (Lidocaine-Hydrocortisone Ace) .... Apply To Hemmorhoids 2 Times A Day For Burning and Pain  Allergies (verified): No Known Drug Allergies  Past History:  Past Medical History: Last updated: 09/30/2006 Hx of Etoh abuse Diabetes mellitus, type II Hypertension Peripheral neuropathy Osteoporosis Pancreatitis, hx of - chronic Renal insufficiency - baseline creatinine: 1.2 Elevated liver enzymes L labial abscess s/p outpt I+D 09/30/06  Past Surgical History: Last updated: 03/15/2006 Cataract extraction both eyes Cholecystectomy  Family History: Last updated: 05/28/07 Mother died of Alzheimer's Disease 77yo Father died of Asthma  Social History: Last updated: 05-28-2007 Hx ETOH, sober since 2006 Smoker Disability [facility] alone at [facility]  Risk Factors: Smoking Status: current (01/09/2010) Packs/Day: 1/2 ppd (01/09/2010)  Family History: Reviewed history from 2007-05-28 and no changes required. Mother died of Alzheimer's Disease 77yo Father died of Asthma  Social History: Reviewed history from 05-28-07 and no changes required. Hx ETOH, sober since 2006 Smoker Disability Lives alone at [facility]  Review of Systems  The patient denies weight loss, vision loss, hoarseness, chest pain, syncope, dyspnea on exertion, peripheral edema, headaches, hemoptysis, abdominal pain, melena, and hematochezia.    Physical Exam  General:  alert, well-developed, well-nourished, and well-hydrated.  alert, well-developed, well-nourished, and well-hydrated.   Nose:  no nasal discharge.  no nasal discharge.   Mouth:  pharynx pink and moist.  pharynx pink and moist.   Neck:  supple.  supple.   Breasts:  skin/areolae normal, no masses, no nipple discharge, and no tenderness.  skin/areolae normal, no masses, no nipple discharge, and no tenderness.   Lungs:  normal respiratory  effort, normal breath sounds, no crackles, and no wheezes.  normal respiratory effort, normal breath sounds, no crackles, and no wheezes.   Heart:  normal rate, regular rhythm, no JVD, and grade  2/6 systolic murmur.  normal rate, regular rhythm, and no JVD.   Abdomen:  soft, non-tender, and normal bowel sounds.  soft, non-tender, and normal bowel sounds.   Rectal:  no fissures, no fistulae, and no perianal rash.   Msk:  normal ROM, no joint tenderness, no joint swelling, and no joint warmth.   Pulses:  2+ Extremities:  No edema.   Neurologic:  alert & oriented X3 and gait normal.    Diabetes Management Exam:    Foot Exam (with socks and/or shoes not present):       Sensory-Monofilament:  Left foot: normal          Right foot: normal   Impression & Recommendations:  Problem # 1:  HEMORRHOIDS (ICD-455.6) Assessment Unchanged Will continue use her anamantle supp. Will add benadryl cream and asked her to use hot water bath. Eat diet rich with fibers.   Problem # 2:  DIABETES MELLITUS, TYPE II, ON INSULIN (ICD-250.00) Assessment: Unchanged Well controlled and continue current meds. Her updated medication list for this problem includes:    Lantus 100 Unit/ml Soln (Insulin glargine) ..... Inject 25 units subcutaneously in the morning with breakfast  Orders: T- Capillary Blood Glucose (95621) T-Hgb A1C (in-house) (30865HQ)  Labs Reviewed: Creat: 1.57 (09/24/2009)     Last Eye Exam: Mild non-proliferative diabetic retinopathy.  OU   (11/30/2008) Reviewed HgBA1c results: 7.4 (01/09/2010)  7.6 (09/24/2009)  Problem # 3:  HYPERTENSION (ICD-401.9) Assessment: Improved BP at goal and continue norvasc.  Her updated medication list for this problem includes:    Amlodipine Besylate 5 Mg Tabs (Amlodipine besylate) .Marland Kitchen... Take 1 tab daily  BP today: 129/72 Prior BP: 133/65 (09/24/2009)  Labs Reviewed: K+: 4.5 (09/24/2009) Creat: : 1.57 (09/24/2009)   Chol: 126 (05/22/2008)    HDL: 58 (05/22/2008)   LDL: 39 (05/22/2008)   TG: 145 (05/22/2008)  Problem # 4:  TOBACCO ABUSE (ICD-305.1) Assessment: Comment Only Current smoker, 1/2 PPD. Encouraged smoking cessation and discussed different methods for smoking cessation.   Complete Medication List: 1)  Creon 24000 Unit Cpep (Pancrelipase (lip-prot-amyl)) .Marland Kitchen.. 1-3 tablets with meals and snacks 2)  Lantus 100 Unit/ml Soln (Insulin glargine) .... Inject 25 units subcutaneously in the morning with breakfast 3)  Accuneb 0.63 Mg/69ml Nebu (Albuterol sulfate) .... 2 puff prn 4)  Prodigy Autocode Blood Glucose Strp (Glucose blood) .... Use to check your blood sugar 3 times daily 5)  Prodigy Insulin Syringe 31g X 5/16" 0.3 Ml Misc (Insulin syringe-needle u-100) .... Use to inject your insulin once daily 6)  Prodigy Twist Top Lancets 28g Misc (Lancets) .... Use with your insulin as instructed 7)  Ventolin Hfa 108 (90 Base) Mcg/act Aers (Albuterol sulfate) .... Take 1-2 puffs every 4-6 hours as needed for shortness of breath 8)  Tramadol Hcl 50 Mg Tabs (Tramadol hcl) .... Take 1 tablet by mouth three times a day as needed for pain. 9)  Amlodipine Besylate 5 Mg Tabs (Amlodipine besylate) .... Take 1 tab daily 10)  Miralax Powd (Polyethylene glycol 3350) .... Mix with water and drink once o twice a day for constipation 11)  Anamantle Hc 3-0.5 % Kit (Lidocaine-hydrocortisone ace) .... Apply to hemmorhoids 2 times a day for burning and pain 12)  Benadryl Maximum Strength 2 % Crea (Diphenhydramine hcl) .... Apply to the affected area three time a day as needed.  Other Orders: T-Hemoccult Card-Multiple (take home) (46962) Influenza Vaccine NON MCR (95284)  Patient Instructions: 1)  Please schedule a follow-up appointment in 3 months with PCP. 2)  If your symptom not get better, please call for appointment. Prescriptions: BENADRYL MAXIMUM STRENGTH 2 % CREA (DIPHENHYDRAMINE HCL) apply to the affected area three time a day as needed.  #1  x 3   Entered and Authorized by:   Jackson Latino MD   Signed by:   Jackson Latino MD on 01/09/2010   Method used:   Electronically to        [facility] (retail)       [address]       [place], [place]  [id number]       Ph: [phone]       Fax: [phone]   RxID:   [id number]    Orders Added: 1)  T- Capillary Blood Glucose [82948] 2)  T-Hgb A1C (in-house) [24401UU] 3)  T-Hemoccult Card-Multiple (take home) [82270] 4)  Est. Patient Level IV [72536] 5)  Influenza Vaccine NON MCR [[id number]]   Immunizations Administered:  Influenza Vaccine # 1:    Vaccine Type: Fluvax Non-MCR    Site: right deltoid    Mfr: GlaxoSmithKline    Dose: 0.5 ml    Route: IM    Given by: Angelina Ok RN    Exp. Date: 08/23/2010    Lot #: [id number]    VIS given: 09/17/09 version given January 09, 2010.  Flu Vaccine Consent Questions:    Do you have a history of severe allergic reactions to this vaccine? no    Any prior history of allergic reactions to egg and/or gelatin? no    Do you have a sensitivity to the preservative Thimersol? no    Do you have a past history of Guillan-Barre Syndrome? no    Do you currently have an acute febrile illness? no    Have you ever had a severe reaction to latex? no    Vaccine information given and explained to patient? yes    Are you currently pregnant? no   Immunizations Administered:  Influenza Vaccine # 1:    Vaccine Type: Fluvax Non-MCR    Site: right deltoid    Mfr: GlaxoSmithKline    Dose: 0.5 ml    Route: IM    Given by: Angelina Ok RN    Exp. Date: 08/23/2010    Lot #: [id number]    VIS given: 09/17/09 version given January 09, 2010.  Prevention & Chronic Care Immunizations   Influenza vaccine: Fluvax Non-MCR  (01/09/2010)   Influenza vaccine deferral: Deferred  (08/27/2009)    Tetanus booster: 08/27/2009: Tdap   Td booster deferral: Deferred  (09/25/2008)    Pneumococcal vaccine: Pneumovax   (08/27/2009)   Pneumococcal vaccine deferral: Deferred  (09/25/2008)  Colorectal Screening   Hemoccult: Not documented   Hemoccult action/deferral: Ordered  (01/09/2010)    Colonoscopy: Normal  (09/07/2005)   Colonoscopy action/deferral: GI referral  (08/27/2009)  Other Screening   Pap smear: Normal  (08/27/2004)   Pap smear action/deferral: Deferred  (08/06/2009)    Mammogram: ASSESSMENT: Negative - BI-RADS 1^MM DIGITAL SCREENING  (02/28/2008)   Mammogram action/deferral: Ordered  (08/06/2009)   Mammogram due: 02/27/2009   Smoking status: current  (01/09/2010)   Smoking cessation counseling: yes  (01/09/2010)  Diabetes Mellitus   HgbA1C: 7.4  (01/09/2010)   Hemoglobin A1C due: 12/26/2008    Eye exam: Mild non-proliferative diabetic retinopathy.  OU    (11/30/2008)    Foot exam: yes  (01/09/2010)   Foot exam action/deferral: Do today   High risk foot: No  (01/09/2010)   Foot care education: Done  (09/25/2008)   Foot exam due: 01/12/2011    Urine microalbumin/creatinine ratio: 130.1  (07/16/2008)   Urine microalbumin action/deferral: Not indicated    Diabetes flowsheet reviewed?: Yes   Progress toward A1C goal: Unchanged  Lipids   Total Cholesterol: 126  (05/22/2008)   Lipid panel action/deferral: Deferred   LDL: 39  (05/22/2008)   LDL Direct: Not documented   HDL: 58  (05/22/2008)   Triglycerides: 145  (05/22/2008)   Lipid panel due: 09/25/2009  Hypertension   Last Blood Pressure: 129 / 72  (  01/09/2010)   Serum creatinine: 1.57  (09/24/2009)   BMP action: Ordered   Serum potassium 4.5  (09/24/2009)   Basic metabolic panel due: 10/26/2008    Hypertension flowsheet reviewed?: Yes   Progress toward BP goal: At goal  Self-Management Support :   Personal Goals (by the next clinic visit) :     Personal A1C goal: 7  (09/25/2008)     Personal blood pressure goal: 130/80  (09/25/2008)     Personal LDL goal: 70  (09/25/2008)    Patient will work on the  following items until the next clinic visit to reach self-care goals:     Medications and monitoring: take my medicines every day, check my blood sugar, examine my feet every day  (01/09/2010)     Eating: drink diet soda or water instead of juice or soda, eat more vegetables, eat foods that are low in salt, eat baked foods instead of fried foods, eat fruit for snacks and desserts  (01/09/2010)     Activity: take a 30 minute walk every day  (08/27/2009)    Diabetes self-management support: Pre-printed educational material, Resources for patients handout  (09/24/2009)   Last diabetes self-management training by diabetes educator: 09/24/2009    Hypertension self-management support: Pre-printed educational material, Resources for patients handout  (09/24/2009)   Nursing Instructions: Diabetic foot exam today Give Flu vaccine today Provide Hemoccult cards with instructions (see order) Refer for screening diabetic eye exam (see order)   Laboratory Results   Blood Tests   Date/Time Received: January 09, 2010 10:52 AM Date/Time Reported: Burke Keels  January 09, 2010 10:52 AM   HGBA1C: 7.4%   (Normal Range: Non-Diabetic - 3-6%   Control Diabetic - 6-8%) CBG Random:: 95mg /dL       Appended Document: Hemoccult Results/[facility]    Lab Visit  Laboratory Results   Blood Tests    Date/Time Reported:    Date/Time Received: March 14, 2010 10:41 AM  Date/Time Reported: Burke Keels  March 14, 2010 10:41 AM   Stool - Occult Blood Hemmoccult #1: negative Date: 01/12/2010 Hemoccult #2: negative Date: 01/13/2010 Hemoccult #3: negative Date: 01/14/2010 Comments: Hemoccult Cards given by [facility] Cards analyzed by Burlene Arnt  March 14, 2010 10:43 AM   Orders Today:

## 2010-03-27 NOTE — Assessment & Plan Note (Signed)
Summary: abscess/gg   Vital Signs:  Patient profile:   60 year old female Height:      66.5 inches (168.91 cm) Weight:      182.7 pounds (83.05 kg) BMI:     29.15 Temp:     96.8 degrees F (36 degrees C) oral Pulse rate:   64 / minute BP sitting:   119 / 64  (right arm) Cuff size:   regular  Vitals Entered By: Cynda Familia Duncan Dull) (January 15, 2010 8:23 AM) CC: pt c/o being able to "squeeze" substance from breast-had abscessed drained from same area ago Is Patient Diabetic? Yes Did you bring your meter with you today? No Pain Assessment Patient in pain? no      Nutritional Status BMI of > 30 = obese  Have you ever been in a relationship where you felt threatened, hurt or afraid?No   Does patient need assistance? Functional Status Self care Ambulation Normal   Primary Care Sofija Antwi:  Bethel Born MD  CC:  pt c/o being able to "squeeze" substance from breast-had abscessed drained from same area ago.  History of Present Illness: Pt  is a 60 yo AAF with PMH of Hx of left breat abscess, DM, HTN and HLD who came here for left breast discharge. She had left breast abscess s/p I&D, and culture negative. Several weeks ago she started to have have white discharge in her nipple and the area just above the nipple. She also has worsening blurry vision in her right eye. 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 She has no HA, N/V, fever, breast pain,  or other c/o.   Preventive Screening-Counseling & Management  Alcohol-Tobacco     Alcohol type: beer occasionally     Smoking Status: current     Smoking Cessation Counseling: yes     Packs/Day: 1/2 ppd     Year Started: at age of 26  Problems Prior to Update: 1)  Need Prophylactic Vaccination&inoculation Flu  (ICD-V04.81) 2)  Tobacco Abuse  (ICD-305.1) 3)  Heart Murmur  (ICD-785.2) 4)  Hemorrhoids  (ICD-455.6) 5)  Degenerative Joint Disease  (ICD-715.90) 6)  Renal Insufficiency  (ICD-588.9) 7)  Osteoporosis   (ICD-733.00) 8)  Pancreatitis, Chronic  (ICD-577.1) 9)  Hypertension  (ICD-401.9) 10)  Ventricular Hypertrophy, Left  (ICD-429.3) 11)  Diabetes Mellitus, Type II, On Insulin  (ICD-250.00) 12)  Renal Insufficiency, Chronic  (ICD-585.9) 13)  Diabetic Peripheral Neuropathy  (ICD-250.60) 14)  Diabetic Retinopathy  (ICD-250.50) 15)  Hypercoagulable State, Primary  (ICD-289.81) 16)  Anemia, Chronic Disease Nec  (ICD-285.29)  Medications Prior to Update: 1)  Creon 24000 Unit Cpep (Pancrelipase (Lip-Prot-Amyl)) .Marland Kitchen.. 1-3 Tablets With Meals and Snacks 2)  Lantus 100 Unit/ml Soln (Insulin Glargine) .... Inject 25 Units Subcutaneously in The Morning With Breakfast 3)  Accuneb 0.63 Mg/5ml  Nebu (Albuterol Sulfate) .... 2 Puff Prn 4)  Prodigy Autocode Blood Glucose  Strp (Glucose Blood) .... Use To Check Your Blood Sugar 3 Times Daily 5)  Prodigy Insulin Syringe 31g X 5/16" 0.3 Ml Misc (Insulin Syringe-Needle U-100) .... Use To Inject Your Insulin Once Daily 6)  Prodigy Twist Top Lancets 28g  Misc (Lancets) .... Use With Your Insulin As Instructed 7)  Ventolin Hfa 108 (90 Base) Mcg/act Aers (Albuterol Sulfate) .... Take 1-2 Puffs Every 4-6 Hours As Needed For Shortness of Breath 8)  Tramadol Hcl 50 Mg Tabs (Tramadol Hcl) .... Take 1 Tablet By Mouth Three Times A Day As Needed For Pain.  9)  Amlodipine Besylate 5 Mg Tabs (Amlodipine Besylate) .... Take 1 Tab Daily 10)  Miralax  Powd (Polyethylene Glycol 3350) .... Mix With Water and Drink Once O Twice A Day For Constipation 11)  Anamantle Hc 3-0.5 % Kit (Lidocaine-Hydrocortisone Ace) .... Apply To Hemmorhoids 2 Times A Day For Burning and Pain 12)  Benadryl Maximum Strength 2 % Crea (Diphenhydramine Hcl) .... Apply To The Affected Area Three Time A Day As Needed.  Current Medications (verified): 1)  Creon 24000 Unit Cpep (Pancrelipase (Lip-Prot-Amyl)) .Marland Kitchen.. 1-3 Tablets With Meals and Snacks 2)  Lantus 100 Unit/ml Soln (Insulin Glargine) .... Inject 25  Units Subcutaneously in The Morning With Breakfast 3)  Accuneb 0.63 Mg/21ml  Nebu (Albuterol Sulfate) .... 2 Puff Prn 4)  Prodigy Autocode Blood Glucose  Strp (Glucose Blood) .... Use To Check Your Blood Sugar 3 Times Daily 5)  Prodigy Insulin Syringe 31g X 5/16" 0.3 Ml Misc (Insulin Syringe-Needle U-100) .... Use To Inject Your Insulin Once Daily 6)  Prodigy Twist Top Lancets 28g  Misc (Lancets) .... Use With Your Insulin As Instructed 7)  Ventolin Hfa 108 (90 Base) Mcg/act Aers (Albuterol Sulfate) .... Take 1-2 Puffs Every 4-6 Hours As Needed For Shortness of Breath 8)  Tramadol Hcl 50 Mg Tabs (Tramadol Hcl) .... Take 1 Tablet By Mouth Three Times A Day As Needed For Pain. 9)  Amlodipine Besylate 5 Mg Tabs (Amlodipine Besylate) .... Take 1 Tab Daily 10)  Miralax  Powd (Polyethylene Glycol 3350) .... Mix With Water and Drink Once O Twice A Day For Constipation 11)  Anamantle Hc 3-0.5 % Kit (Lidocaine-Hydrocortisone Ace) .... Apply To Hemmorhoids 2 Times A Day For Burning and Pain 12)  Benadryl Maximum Strength 2 % Crea (Diphenhydramine Hcl) .... Apply To The Affected Area Three Time A Day As Needed.  Allergies: No Known Drug Allergies  Past History:  Past Medical History: Last updated: 09/30/2006 Hx of Etoh abuse Diabetes mellitus, type II Hypertension Peripheral neuropathy Osteoporosis Pancreatitis, hx of - chronic Renal insufficiency - baseline creatinine: 1.2 Elevated liver enzymes L labial abscess s/p outpt I+D 09/30/06  Family History: Last updated: 05-27-2007 Mother died of Alzheimer's Disease 77yo Father died of Asthma  Social History: Last updated: May 27, 2007 Hx ETOH, sober since 2006 Smoker Disability Lives alone at [facility]  Risk Factors: Smoking Status: current (01/15/2010) Packs/Day: 1/2 ppd (01/15/2010)  Family History: Reviewed history from 2007-05-27 and no changes required. Mother died of Alzheimer's Disease 77yo Father died of Asthma  Social  History: Reviewed history from May 27, 2007 and no changes required. Hx ETOH, sober since 2006 Smoker Disability Lives alone at [facility]  Review of Systems  The patient denies weight loss, vision loss, chest pain, syncope, dyspnea on exertion, peripheral edema, prolonged cough, headaches, hemoptysis, abdominal pain, and melena.    Physical Exam  General:  alert, well-developed, well-nourished, and well-hydrated.   Eyes:  vision grossly intact, pupils equal, pupils round, and pupils reactive to light.   Nose:  no nasal discharge.   Mouth:  pharynx pink and moist.   Neck:  supple.   Breasts:  Minimal amount of whitish discharge from nipple and the skin 1cm above the nipple. no masses, no tenderness, and no adenopathy.   Lungs:  normal respiratory effort, normal breath sounds, no crackles, and no wheezes.   Heart:  normal rate, regular rhythm, no JVD, and grade  2/6 systolic murmur.   Abdomen:  soft, non-tender, normal bowel sounds, no distention, and no masses.  Msk:  normal ROM, no joint tenderness, no joint swelling, and no joint warmth.   Pulses:  2+ Extremities:  No edema. Neurologic:  alert & oriented X3 and gait normal.     Impression & Recommendations:  Problem # 1:  NIPPLE DISCHARGE (ICD-611.79) Assessment New Her nipple lesion is not abscess, but concerned with galacotohrhea with vision change. Will check prolactin and TSH as well as CMET and CBC. After that, may check MRI of brain to r/o pituitary tumor if any abnormal labs. Mammaogram has been scheduled. Also advised her to check her eye  by her eye doctor and she said she will call them ASAP.  Orders: T-Prolactin [phone]) T-TSH [phone]) T-Culture, Wound ([id number]) T-CMP with Estimated GFR ([id number]) T-CBC No Diff ([id number])  Problem # 2:  HYPERTENSION (ICD-401.9) Assessment: Unchanged Well controlled and will continue norvasc.  Her updated medication list for this problem includes:     Amlodipine Besylate 5 Mg Tabs (Amlodipine besylate) .Marland Kitchen... Take 1 tab daily  BP today: 119/64 Prior BP: 129/72 (01/09/2010)  Labs Reviewed: K+: 4.5 (09/24/2009) Creat: : 1.57 (09/24/2009)   Chol: 126 (05/22/2008)   HDL: 58 (05/22/2008)   LDL: 39 (05/22/2008)   TG: 145 (05/22/2008)  Problem # 3:  DIABETES MELLITUS, TYPE II, ON INSULIN (ICD-250.00) Assessment: Unchanged Continue current meds.  Her updated medication list for this problem includes:    Lantus 100 Unit/ml Soln (Insulin glargine) ..... Inject 25 units subcutaneously in the morning with breakfast  Orders: T- Capillary Blood Glucose ([id number])  Labs Reviewed: Creat: 1.57 (09/24/2009)     Last Eye Exam: Mild non-proliferative diabetic retinopathy.  OU   (11/30/2008) Reviewed HgBA1c results: 7.4 (01/09/2010)  7.6 (09/24/2009)  Problem # 4:  RENAL INSUFFICIENCY, CHRONIC (ICD-585.9) Assessment: Comment Only Will check CMET for Cr.   Complete Medication List: 1)  Creon 24000 Unit Cpep (Pancrelipase (lip-prot-amyl)) .Marland Kitchen.. 1-3 tablets with meals and snacks 2)  Lantus 100 Unit/ml Soln (Insulin glargine) .... Inject 25 units subcutaneously in the morning with breakfast 3)  Accuneb 0.63 Mg/41ml Nebu (Albuterol sulfate) .... 2 puff prn 4)  Prodigy Autocode Blood Glucose Strp (Glucose blood) .... Use to check your blood sugar 3 times daily 5)  Prodigy Insulin Syringe 31g X 5/16" 0.3 Ml Misc (Insulin syringe-needle u-100) .... Use to inject your insulin once daily 6)  Prodigy Twist Top Lancets 28g Misc (Lancets) .... Use with your insulin as instructed 7)  Ventolin Hfa 108 (90 Base) Mcg/act Aers (Albuterol sulfate) .... Take 1-2 puffs every 4-6 hours as needed for shortness of breath 8)  Tramadol Hcl 50 Mg Tabs (Tramadol hcl) .... Take 1 tablet by mouth three times a day as needed for pain. 9)  Amlodipine Besylate 5 Mg Tabs (Amlodipine besylate) .... Take 1 tab daily 10)  Miralax Powd (Polyethylene glycol 3350) .... Mix with water and  drink once o twice a day for constipation 11)  Anamantle Hc 3-0.5 % Kit (Lidocaine-hydrocortisone ace) .... Apply to hemmorhoids 2 times a day for burning and pain 12)  Benadryl Maximum Strength 2 % Crea (Diphenhydramine hcl) .... Apply to the affected area three time a day as needed.  Patient Instructions: 1)  Please schedule a follow-up appointment in 2-3 weeks. 2)  Please call your eye doctor for eye exam. 3)  We will call you if any abnormal labs.    Orders Added: 1)  T-Prolactin [[id number]] 2)  T-TSH [[id number]] 3)  T-Culture, Wound [[id number]] 4)  T-CMP with Estimated GFR [[id number]] 5)  T-CBC  No Diff [[id number]] 6)  T- Capillary Blood Glucose [82948] 7)  Est. Patient Level IV [04540]   Process Orders Check Orders Results:     [facility]: Order checked:     Jackson Latino MD NOT AUTHORIZED TO ORDER Tests Sent for requisitioning (January 15, 2010 2:07 PM):     01/15/2010: [facility] -- T-Prolactin [[id number]] (signed)     01/15/2010: [facility] -- T-TSH [phone] (signed)     01/15/2010: [facility] -- T-Culture, Wound [[id number]] (signed)     01/15/2010: [facility] -- T-CMP with Estimated GFR [[id number]] (signed)     01/15/2010: [facility] -- T-CBC No Diff [[id number]] (signed)     Prevention & Chronic Care Immunizations   Influenza vaccine: Fluvax Non-MCR  (01/09/2010)   Influenza vaccine deferral: Deferred  (08/27/2009)    Tetanus booster: 08/27/2009: Tdap   Td booster deferral: Deferred  (09/25/2008)    Pneumococcal vaccine: Pneumovax  (08/27/2009)   Pneumococcal vaccine deferral: Deferred  (09/25/2008)  Colorectal Screening   Hemoccult: Not documented   Hemoccult action/deferral: Ordered  (01/09/2010)    Colonoscopy: Normal  (09/07/2005)   Colonoscopy action/deferral: GI referral  (08/27/2009)  Other Screening   Pap smear:  Normal  (08/27/2004)   Pap smear action/deferral: Deferred  (08/06/2009)    Mammogram: ASSESSMENT: Negative - BI-RADS 1^MM DIGITAL SCREENING  (02/28/2008)   Mammogram action/deferral: Ordered  (08/06/2009)   Mammogram due: 02/27/2009   Smoking status: current  (01/15/2010)   Smoking cessation counseling: yes  (01/15/2010)  Diabetes Mellitus   HgbA1C: 7.4  (01/09/2010)   Hemoglobin A1C due: 12/26/2008    Eye exam: Mild non-proliferative diabetic retinopathy.  OU    (11/30/2008)    Foot exam: yes  (01/09/2010)   Foot exam action/deferral: Do today   High risk foot: No  (01/09/2010)   Foot care education: Done  (09/25/2008)   Foot exam due: 01/12/2011    Urine microalbumin/creatinine ratio: 130.1  (07/16/2008)   Urine microalbumin action/deferral: Not indicated    Diabetes flowsheet reviewed?: Yes   Progress toward A1C goal: Unchanged  Lipids   Total Cholesterol: 126  (05/22/2008)   Lipid panel action/deferral: Deferred   LDL: 39  (05/22/2008)   LDL Direct: Not documented   HDL: 58  (05/22/2008)   Triglycerides: 145  (05/22/2008)   Lipid panel due: 09/25/2009  Hypertension   Last Blood Pressure: 119 / 64  (01/15/2010)   Serum creatinine: 1.57  (09/24/2009)   BMP action: Ordered   Serum potassium 4.5  (09/24/2009)   Basic metabolic panel due: 10/26/2008    Hypertension flowsheet reviewed?: Yes   Progress toward BP goal: At goal  Self-Management Support :   Personal Goals (by the next clinic visit) :     Personal A1C goal: 7  (09/25/2008)     Personal blood pressure goal: 130/80  (09/25/2008)     Personal LDL goal: 70  (09/25/2008)    Patient will work on the following items until the next clinic visit to reach self-care goals:     Medications and monitoring: take my medicines every day  (01/15/2010)     Eating: drink diet soda or water instead of juice or soda, eat more vegetables, eat foods that are low in salt, eat baked foods instead of fried foods, eat fruit  for snacks and desserts  (01/09/2010)     Activity: take a 30 minute walk every day  (08/27/2009)    Diabetes self-management support: Written self-care  plan  (01/15/2010)   Diabetes care plan printed   Last diabetes self-management training by diabetes educator: 09/24/2009    Hypertension self-management support: Written self-care plan  (01/15/2010)   Hypertension self-care plan printed.

## 2010-03-27 NOTE — Progress Notes (Signed)
Summary: refill/ hla  Phone Note Refill Request Message from:  Fax from Pharmacy on August 07, 2009 11:06 AM  Refills Requested: Medication #1:  PRODIGY INSULIN SYRINGE 31G X 5/16" 0.3 ML MISC Use to inject your insulin once daily   Last Refilled: 12/27 Initial call taken by: Marin Roberts RN,  August 07, 2009 11:07 AM  Follow-up for Phone Call       Follow-up by: Silvestre Gunner MD,  August 07, 2009 5:08 PM    Prescriptions: PRODIGY INSULIN SYRINGE 31G X 5/16" 0.3 ML MISC (INSULIN SYRINGE-NEEDLE U-100) Use to inject your insulin once daily  #31 x prn   Entered and Authorized by:   Silvestre Gunner MD   Signed by:   Silvestre Gunner MD on 08/07/2009   Method used:   Electronically to        [facility] (retail)       [address]       [place], [place]  [address]       Ph: [phone]       Fax: [phone]   RxID:   [id number]

## 2010-03-27 NOTE — Consult Note (Signed)
Summary: [facility]  [facility]   Imported By: Louretta Parma 01/30/2010 11:55:58  _____________________________________________________________________  External Attachment:    Type:   Image     Comment:   External Document

## 2010-03-27 NOTE — Miscellaneous (Signed)
Summary: Hospital admission  INTERNAL MEDICINE ADMISSION HISTORY AND PHYSICAL  Attending Dr Coralee Pesa R1 Dr Eben Burow [phone] R2 Dr Cena Benton [phone]  PCP: Dr Bethel Born  CC: Was called by nurse to get admiited for hyperkalemia  HPI: Patient is 59 year old women with PMH of DM type 2 for 15 years on insulin since last 8 years, HTN and chronic renal insufficiency with baseline Crt of 1.2, history of recurrent boils and brest infections and was on bactrim since last 2 weeks for left brest abscess was called in by the clinic nurse because her Crt was 2.2 and her K was 5.7. No chest pain, shortness of breath, abdominal pain, change in frequency/colour of urine, does c/o some constipation with last BM on monday ie 3 days ago. No leg swelling, cramping, palpitations or altered mental status.   ALLERGIES: NKDA  PAST MEDICAL HISTORY: Hx of Etoh abuse Diabetes mellitus, type II Hypertension Peripheral neuropathy Osteoporosis Pancreatitis, hx of - chronic Renal insufficiency - baseline creatinine: 1.2 Elevated liver enzymes L labial abscess s/p outpt I+D 09/30/06   MEDICATIONS: CREON 24000 UNIT CPEP (PANCRELIPASE (LIP-PROT-AMYL)) 1-3 tablets with meals and snacks LANTUS 100 UNIT/ML SOLN (INSULIN GLARGINE) Inject 25 units subcutaneously in the morning with breakfast VENTOLIN HFA 108 (90 BASE) MCG/ACT AERS (ALBUTEROL SULFATE) Take 1-2 puffs every 4-6 hours as needed for shortness of breath TRAMADOL HCL 50 MG TABS (TRAMADOL HCL) Take 1 tablet by mouth three times a day as needed for pain. LIDOCAINE-HYDROCORTISONE ACE 3-1 % KIT (LIDOCAINE-HYDROCORTISONE ACE) apply to area two times a day BACTRIM DS 800-160 MG TABS (SULFAMETHOXAZOLE-TRIMETHOPRIM) Take 1 tablet by mouth two times a day   SOCIAL HISTORY: Hx ETOH, sober since 2006 Smoker 30+ppd Disability since 2003 Lives alone at [facility]   FAMILY HISTORY Mother died of Alzheimer's Disease 77yo Father died of Asthma   ROS: As per  HPI VITALS:  T: 98.8 P:68  BP: 125/62 R: 16 O2SAT: 98% on RA PHYSICAL EXAM: Gen: AOx3, in no acute distress Eyes: PERRL, EOMI ENT:MMM, No erythema noted in posterior pharynx Neck: No JVD, No LAP Chest: CTAB with  good respiratory effort CVS: regular rhythmic rate, NO M/R/G, S1 S2 normal Abdo: soft,ND, BS+x4, Non tender and No hepatosplenomegaly EXT: No odema noted Neuro: Non focal, gait is normal Skin: left sided brest with an incision which is healing well, slightly tender to touch  LABS:  WBC                                      4.4               4.0-10.5         K/uL  RBC                                      3.37       l      3.87-5.11        MIL/uL  Hemoglobin (HGB)                         10.7       l      12.0-15.0        g/dL  Hematocrit (HCT)  31.2       l      36.0-46.0        %  MCV                                      92.5              78.0-100.0       fL  MCH -                                    31.9              26.0-34.0        pg  MCHC                                     34.4              30.0-36.0        g/dL  RDW                                      14.4              11.5-15.5        %  Platelet Count (PLT)                     166               150-400          K/uL  Neutrophils, %                           48                43-77            %  Lymphocytes, %                           44                12-46            %  Monocytes, %                             5                 3-12             %  Eosinophils, %                           3                 0-5              %  Basophils, %                             1  0-1              %  Neutrophils, Absolute                    2.1               1.7-7.7          K/uL  Lymphocytes, Absolute                    1.9               0.7-4.0          K/uL  Monocytes, Absolute                      0.2               0.1-1.0          K/uL  Eosinophils, Absolute                    0.1                0.0-0.7          K/uL  Basophils, Absolute                      0.0               0.0-0.1          K/uL  Sodium (NA)                              135               135-145          mEq/L  Potassium (K)                            5.7        h      3.5-5.1          mEq/L  Chloride                                 110               96-112           mEq/L  CO2                                      20                19-32            mEq/L  Glucose                                  98                70-99            mg/dL  BUN  55         h      6-23             mg/dL  Creatinine                               2.21       h      0.4-1.2          mg/dL  GFR, Est Non African American            23         l      >60              mL/min  GFR, Est African American                28         l      >60              mL/min    Oversized comment, see footnote  1  Bilirubin, Total                         0.3               0.3-1.2          mg/dL  Alkaline Phosphatase                     97                39-117           U/L  SGOT (AST)                               40         h      0-37             U/L  SGPT (ALT)                               47         h      0-35             U/L  Total  Protein                           7.3               6.0-8.3          g/dL  Albumin-Blood                            3.7               3.5-5.2          g/dL  Calcium                                  9.2               8.4-10.5         mg/dL   Lipase  15                11-59            U/L  I-STAT-Comment                           SEE NOTE.    Oversized comment, see footnote  1  CKMB, POC                                2.4               1.0-8.0          ng/mL  Troponin I, POC                          <0.05             0.00-0.09        ng/mL  Myoglobin, POC                           193               12-200           ng/mL  ASSESSMENT AND  PLAN: Acute renal insufficiency Start fluid hydration, her BUN is high with BUN/Crt ratio <20. We will rehydrate her and follow Bmet tomorrow morning. Differential include AIN 2/2 bactrim, dehydration. Looking abck at her chart her Crt has been above 2 on 2 seperate occasions in last 2 weeks which will support the theory that this is 2/2 AIN.  Hyperkalemia This is most likely 2/2 #1. She already recieved insulin with D50, albuterol and Kayexalate in ED. We will check Bmet now and follow it in the morning to keep a check on K.  DM: Will cover with SSI, and lantus at home dose. HBa1c 8.1 in May 2011.  HTN: Continue home medications. Hold lisinopril for now.  Chronic pancreatitis: Keep her on pancreatic enzymes home dose. Lipase negative.  VTE PROPH: lovenox   R1____________  R2____________  Attending______________

## 2010-03-27 NOTE — Progress Notes (Signed)
Summary: refill/gg  Phone Note Refill Request  on December 04, 2009 12:16 PM  Refills Requested: Medication #1:  PROCTOFOAM 1 % FOAM twice a day for itching and pain. Pt prefers the cream, can you change to ANAMANTLE HC 3-0.5 %  CREA (LIDOCAINE-HYDROCORTISONE ACE) Apply to external hemmorhoids two times a day for burning and pain  #1 tube x 3     or 98 Grams   Method Requested: Electronic Initial call taken by: Merrie Roof RN,  December 04, 2009 12:20 PM    New/Updated Medications: ANAMANTLE HC 3-0.5 % KIT (LIDOCAINE-HYDROCORTISONE ACE) apply to hemmorhoids 2 times a day for burning and pain Prescriptions: ANAMANTLE HC 3-0.5 % KIT (LIDOCAINE-HYDROCORTISONE ACE) apply to hemmorhoids 2 times a day for burning and pain  #1 x 3   Entered and Authorized by:   Bethel Born MD   Signed by:   Bethel Born MD on 12/04/2009   Method used:   Electronically to        [facility] (retail)       [address]       [place], [place]  [id number]       Ph: [phone]       Fax: [phone]   RxID:   [id number]

## 2010-03-27 NOTE — Progress Notes (Signed)
Summary: med refill/gp  Phone Note Refill Request Message from:  Fax from Pharmacy on April 05, 2009 10:38 AM  Refills Requested: Medication #1:  LISINOPRIL 20 MG TABS Take 1 tablet by mouth once a day   Last Refilled: 03/02/2009 Last appt. 03/15/09.  Last CMP 11/21/08.   Method Requested: Electronic Initial call taken by: Chinita Pester RN,  April 05, 2009 10:38 AM  Follow-up for Phone Call       Follow-up by: Blanch Media MD,  April 05, 2009 11:03 AM    Prescriptions: LISINOPRIL 20 MG TABS (LISINOPRIL) Take 1 tablet by mouth once a day  #93 x prn   Entered and Authorized by:   Blanch Media MD   Signed by:   Blanch Media MD on 04/05/2009   Method used:   Electronically to        [facility] (retail)       [address]       [place], [place]  [address]       Ph: [phone]       Fax: [phone]   RxID:   [id number]

## 2010-03-27 NOTE — Medication Information (Signed)
Summary: [facility]   Imported By: Sherian Rein 07/11/2009 11:35:57  _____________________________________________________________________  External Attachment:    Type:   Image     Comment:   External Document

## 2010-03-27 NOTE — Progress Notes (Signed)
Summary: hemorrhoids/ hla  Phone Note From Pharmacy   Summary of Call: `pt has told pharm she needs something for hemorrhoids, she brought the script from hosp in but the pharm cannot get this medication, could you please order one of the more common hemorrhoid meds? the miralax was filled at the lowest dose so no need to change the script. Initial call taken by: Marin Roberts RN,  August 30, 2009 4:29 PM  Follow-up for Phone Call       Follow-up by: Bethel Born MD,  August 30, 2009 4:53 PM    New/Updated Medications: PROCTOFOAM 1 % FOAM (PRAMOXINE HCL) twice a day for itching and pain Prescriptions: PROCTOFOAM 1 % FOAM (PRAMOXINE HCL) twice a day for itching and pain  #1 x 3   Entered and Authorized by:   Bethel Born MD   Signed by:   Bethel Born MD on 08/30/2009   Method used:   Electronically to        [facility] (retail)       [address]       [place], [place]  [id number]       Ph: [phone]       Fax: [phone]   RxID:   [id number]

## 2010-03-27 NOTE — Assessment & Plan Note (Addendum)
Summary: EST-BOIL AND RETURNED/CFB   Vital Signs:  Patient profile:   60 year old female Height:      66.5 inches (168.91 cm) Weight:      180.01 pounds (81.82 kg) BMI:     28.72 Temp:     97.8 degrees F (36.56 degrees C) oral Pulse rate:   78 / minute BP sitting:   150 / 74  (right arm)  Vitals Entered By: Angelina Ok RN (March 13, 2010 3:44 PM) CC: Depression Is Patient Diabetic? Yes Did you bring your meter with you today? No Pain Assessment Patient in pain? yes     Location: hip, lower back Intensity: 5 Type: aching Onset of pain  Intermittent Nutritional Status BMI of 25 - 29 = overweight CBG Result 196  Have you ever been in a relationship where you felt threatened, hurt or afraid?No   Does patient need assistance? Functional Status Self care Ambulation Normal Comments Boil on Buttucks.  Nose bleeds occassionally.  Pancreas has been bothering her.   Primary Care Provider:  Bethel Born MD  CC:  Depression.  History of Present Illness: 60 y/o w with h/o HTN ,DM, CKD  comes to the clinic c/o of boil on her buttock  she noticed this boil for 2 weeks. she had that before, they resolved without treatment. the boils at this time do not hurt. they have not been drainiage. no fever, chills, etc. no boils anywhere else.  not getting better or worse.     Depression History:      The patient denies a depressed mood most of the day and a diminished interest in her usual daily activities.         Current Medications (verified): 1)  Creon 24000 Unit Cpep (Pancrelipase (Lip-Prot-Amyl)) .Marland Kitchen.. 1-3 Tablets With Meals and Snacks 2)  Lantus 100 Unit/ml Soln (Insulin Glargine) .... Inject 25 Units Subcutaneously in The Morning With Breakfast 3)  Accuneb 0.63 Mg/77ml  Nebu (Albuterol Sulfate) .... 2 Puff Prn 4)  Prodigy Autocode Blood Glucose  Strp (Glucose Blood) .... Use To Check Your Blood Sugar 3 Times Daily 5)  Prodigy Insulin Syringe 31g X 5/16" 0.3 Ml Misc (Insulin  Syringe-Needle U-100) .... Use To Inject Your Insulin Once Daily 6)  Prodigy Twist Top Lancets 28g  Misc (Lancets) .... Use With Your Insulin As Instructed 7)  Ventolin Hfa 108 (90 Base) Mcg/act Aers (Albuterol Sulfate) .... Take 1-2 Puffs Every 4-6 Hours As Needed For Shortness of Breath 8)  Amlodipine Besylate 5 Mg Tabs (Amlodipine Besylate) .... Take 1 Tab Daily 9)  Miralax  Powd (Polyethylene Glycol 3350) .... Mix With Water and Drink Once O Twice A Day For Constipation 10)  Anamantle Hc 3-0.5 % Kit (Lidocaine-Hydrocortisone Ace) .... Apply To Hemmorhoids 2 Times A Day For Burning and Pain  Allergies (verified): No Known Drug Allergies  Review of Systems  The patient denies anorexia, fever, weight loss, weight gain, vision loss, decreased hearing, hoarseness, chest pain, syncope, dyspnea on exertion, peripheral edema, prolonged cough, headaches, hemoptysis, abdominal pain, melena, hematochezia, severe indigestion/heartburn, hematuria, incontinence, genital sores, muscle weakness, suspicious skin lesions, transient blindness, difficulty walking, depression, unusual weight change, abnormal bleeding, enlarged lymph nodes, angioedema, breast masses, and testicular masses.    Physical Exam  General:  Gen: VS reveiwed, Alert, well developed, nodistress ENT: mucous membranes pink & moist. No abnormal finds in ear and nose. CVC:S1 S2 , no murmurs, no abnormal heart sounds. Lungs: Clear to auscultation B/L. No wheezes, crackles or  other abnormal sounds Abdomen: soft, non distended, no tender. Normal Bowel sounds EXT: no pitting edema, no engorged veins, Pulsations normal  Neuro:alert, oriented *3, cranial nerved 2-12 intact, strenght normal in all  extremities, senstations normal to light touch.    Skin:  3-4 cm pustular follicle in the inner side of left buttock nar hair follicle. draining whitish material. non tender, non foul smelling, no sarrounding erythema. no enlarged  lymphnodes   Impression & Recommendations:  Problem # 1:  BOILS, RECURRENT (ICD-680.9) pustular lesion on inner side of buttock had simialr lesion in armpit and back in the past  plan -topical clindamycin -hygiene advise  Problem # 2:  DEGENERATIVE JOINT DISEASE (ICD-715.90)  could not tolerate tramadol  plan - mobic - sports medicine referral  The following medications were removed from the medication list:    Tramadol Hcl 50 Mg Tabs (Tramadol hcl) .Marland Kitchen... Take 1 tablet by mouth three times a day as needed for pain. Her updated medication list for this problem includes:    Meloxicam 7.5 Mg Tabs (Meloxicam) .Marland Kitchen... Take 1 tablet by mouth once a day  Orders: Sports Medicine (Sports Med) T-DG Lumbar Spine 2-3 Views (72100)  Complete Medication List: 1)  Creon 24000 Unit Cpep (Pancrelipase (lip-prot-amyl)) .Marland Kitchen.. 1-3 tablets with meals and snacks 2)  Lantus 100 Unit/ml Soln (Insulin glargine) .... Inject 25 units subcutaneously in the morning with breakfast 3)  Accuneb 0.63 Mg/31ml Nebu (Albuterol sulfate) .... 2 puff prn 4)  Prodigy Autocode Blood Glucose Strp (Glucose blood) .... Use to check your blood sugar 3 times daily 5)  Prodigy Insulin Syringe 31g X 5/16" 0.3 Ml Misc (Insulin syringe-needle u-100) .... Use to inject your insulin once daily 6)  Prodigy Twist Top Lancets 28g Misc (Lancets) .... Use with your insulin as instructed 7)  Ventolin Hfa 108 (90 Base) Mcg/act Aers (Albuterol sulfate) .... Take 1-2 puffs every 4-6 hours as needed for shortness of breath 8)  Amlodipine Besylate 5 Mg Tabs (Amlodipine besylate) .... Take 1 tab daily 9)  Miralax Powd (Polyethylene glycol 3350) .... Mix with water and drink once o twice a day for constipation 10)  Anamantle Hc 3-0.5 % Kit (Lidocaine-hydrocortisone ace) .... Apply to hemmorhoids 2 times a day for burning and pain 11)  Clindamycin Phosphate 2 % Crea (Clindamycin phosphate) .... Apply to the affected area twice a day. keep the  area clean and dry. 12)  Meloxicam 7.5 Mg Tabs (Meloxicam) .... Take 1 tablet by mouth once a day  Other Orders: Capillary Blood Glucose/CBG (16109) Capillary Blood Glucose/CBG (60454)  Patient Instructions: 1)  Please schedule a follow-up appointment in 1 month. Prescriptions: MELOXICAM 7.5 MG TABS (MELOXICAM) Take 1 tablet by mouth once a day  #31 x 1   Entered and Authorized by:   Bethel Born MD   Signed by:   Bethel Born MD on 03/13/2010   Method used:   Electronically to        [facility] (retail)       [address]       [place], [place]  [address]       Ph: [phone]       Fax: [phone]   RxID:   [id number] CLINDAMYCIN PHOSPHATE 2 % CREA (CLINDAMYCIN PHOSPHATE) apply to the affected area twice a day. Keep the area clean and dry.  #2 x 0   Entered and Authorized by:   Bethel Born MD   Signed by:   Bethel Born MD  on 03/13/2010   Method used:   Electronically to        [facility]* (retail)       [address]       [place], [place]  [address]       Ph: [phone]       Fax: [phone]   RxID:   [id number]    Orders Added: 1)  Capillary Blood Glucose/CBG [82948] 2)  Capillary Blood Glucose/CBG [82948] 3)  Sports Medicine [Sports Med] 4)  Est. Patient Level IV [27253] 5)  T-DG Lumbar Spine 2-3 Views [72100]    Prevention & Chronic Care Immunizations   Influenza vaccine: Fluvax Non-MCR  (01/09/2010)   Influenza vaccine deferral: Deferred  (08/27/2009)    Tetanus booster: 08/27/2009: Tdap   Td booster deferral: Deferred  (09/25/2008)    Pneumococcal vaccine: Pneumovax  (08/27/2009)   Pneumococcal vaccine deferral: Deferred  (09/25/2008)  Colorectal Screening   Hemoccult: Not documented   Hemoccult action/deferral: Ordered  (01/09/2010)    Colonoscopy: Normal  (09/07/2005)   Colonoscopy action/deferral: GI referral  (08/27/2009)  Other Screening   Pap smear: Normal   (08/27/2004)   Pap smear action/deferral: Deferred  (08/06/2009)    Mammogram: BI-RADS CATEGORY 1:  Negative.^MM DIGITAL DIAGNOSTIC BILAT  (01/22/2010)   Mammogram action/deferral: Ordered  (08/06/2009)   Mammogram due: 02/27/2009   Smoking status: current  (01/15/2010)   Smoking cessation counseling: yes  (01/15/2010)  Diabetes Mellitus   HgbA1C: 7.4  (01/09/2010)   Hemoglobin A1C due: 12/26/2008    Eye exam: Mild non-proliferative diabetic retinopathy.  OU    (11/30/2008)    Foot exam: yes  (01/09/2010)   Foot exam action/deferral: Do today   High risk foot: No  (01/09/2010)   Foot care education: Done  (09/25/2008)   Foot exam due: 01/12/2011    Urine microalbumin/creatinine ratio: 130.1  (07/16/2008)   Urine microalbumin action/deferral: Not indicated  Lipids   Total Cholesterol: 126  (05/22/2008)   Lipid panel action/deferral: Deferred   LDL: 39  (05/22/2008)   LDL Direct: Not documented   HDL: 58  (05/22/2008)   Triglycerides: 145  (05/22/2008)   Lipid panel due: 09/25/2009  Hypertension   Last Blood Pressure: 150 / 74  (03/13/2010)   Serum creatinine: 1.42  (01/15/2010)   BMP action: Ordered   Serum potassium 4.2  (01/15/2010)   Basic metabolic panel due: 10/26/2008  Self-Management Support :   Personal Goals (by the next clinic visit) :     Personal A1C goal: 7  (09/25/2008)     Personal blood pressure goal: 130/80  (09/25/2008)     Personal LDL goal: 70  (09/25/2008)    Patient will work on the following items until the next clinic visit to reach self-care goals:     Medications and monitoring: take my medicines every day, check my blood sugar, bring all of my medications to every visit, examine my feet every day  (03/13/2010)     Eating: drink diet soda or water instead of juice or soda, eat more vegetables, use fresh or frozen vegetables, eat foods that are low in salt, eat baked foods instead of fried foods, eat fruit for snacks and desserts, limit or  avoid alcohol  (03/13/2010)     Activity: take a 30 minute walk every day  (03/13/2010)    Diabetes self-management support: Written self-care plan, Education handout, Pre-printed educational material, Resources for patients handout  (03/13/2010)   Diabetes care plan printed  Diabetes education handout printed   Last diabetes self-management training by diabetes educator: 09/24/2009    Hypertension self-management support: Written self-care plan, Education handout, Pre-printed educational material, Resources for patients handout  (03/13/2010)   Hypertension self-care plan printed.   Hypertension education handout printed      Resource handout printed.    Vital Signs:  Patient profile:   60 year old female Height:      66.5 inches (168.91 cm) Weight:      180.01 pounds (81.82 kg) BMI:     28.72 Temp:     97.8 degrees F (36.56 degrees C) oral Pulse rate:   78 / minute BP sitting:   150 / 74  (right arm)  Vitals Entered By: Angelina Ok RN (March 13, 2010 3:44 PM)   Appended Document: changing mobic to naproxen- prior authorization    Clinical Lists Changes  Medications: Changed medication from MELOXICAM 7.5 MG TABS (MELOXICAM) Take 1 tablet by mouth once a day to NAPROXEN 250 MG TABS (NAPROXEN) 1 tablet twice a day as needed for pain - Signed Rx of NAPROXEN 250 MG TABS (NAPROXEN) 1 tablet twice a day as needed for pain;  #60 x 3;  Signed;  Entered by: Bethel Born MD;  Authorized by: Bethel Born MD;  Method used: Electronically to [facility] [address], [place], [place]  [address], Ph: [phone], Fax: [phone]    Prescriptions: NAPROXEN 250 MG TABS (NAPROXEN) 1 tablet twice a day as needed for pain  #60 x 3   Entered and Authorized by:   Bethel Born MD   Signed by:   Bethel Born MD on 03/19/2010   Method used:   Electronically to        [facility] (retail)       [address]        [place], [place]  [address]       Ph: [phone]       Fax: [phone]   RxID:   [id number]    Appended Document: EST-BOIL AND RETURNED/CFB Pt aware of change in med due to Medicaid.

## 2010-03-27 NOTE — Progress Notes (Signed)
Summary: refill/gg  Phone Note Refill Request  on November 22, 2009 1:55 PM  Refills Requested: Medication #1:  VENTOLIN HFA 108 (90 BASE) MCG/ACT AERS Take 1-2 puffs every 4-6 hours as needed for shortness of breath   Dosage confirmed as above?Dosage Confirmed   Last Refilled: 10/25/2009  Method Requested: Electronic Initial call taken by: Merrie Roof RN,  November 22, 2009 1:55 PM  Follow-up for Phone Call       Follow-up by: Bethel Born MD,  November 25, 2009 3:11 PM    Prescriptions: VENTOLIN HFA 108 (90 BASE) MCG/ACT AERS (ALBUTEROL SULFATE) Take 1-2 puffs every 4-6 hours as needed for shortness of breath  #1 inhaler x 11   Entered and Authorized by:   Bethel Born MD   Signed by:   Bethel Born MD on 11/25/2009   Method used:   Electronically to        [facility] (retail)       [address]       [place], [place]  [address]       Ph: [phone]       Fax: [phone]   RxID:   [id number]

## 2010-04-01 ENCOUNTER — Ambulatory Visit: Payer: Self-pay | Admitting: Family Medicine

## 2010-04-11 ENCOUNTER — Telehealth: Payer: Self-pay | Admitting: *Deleted
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
## 2010-04-11 NOTE — Telephone Encounter (Signed)
I would recommend that she sees the surgeon who did her breast surgery to make sure that there are problems that need to be addressed urgently.  [facility] evaluation is okay as well.

## 2010-04-14 NOTE — Telephone Encounter (Signed)
Pt has been informed and will call the surgeon

## 2010-04-18 ENCOUNTER — Other Ambulatory Visit: Payer: Self-pay | Admitting: *Deleted

## 2010-04-20 MED ORDER — LIDOCAINE-HYDROCORTISONE ACE 3-0.5 % EX CREA: TOPICAL_CREAM | CUTANEOUS | Status: DC

## 2010-04-20 MED ORDER — INSULIN GLARGINE 100 UNIT/ML ~~LOC~~ SOLN: SUBCUTANEOUS | Status: DC

## 2010-04-20 MED ORDER — AMLODIPINE BESYLATE 5 MG PO TABS: 5.0000 mg | ORAL_TABLET | Freq: Every day | ORAL | Status: DC

## 2010-05-06 LAB — GLUCOSE, CAPILLARY

## 2010-05-09 LAB — GLUCOSE, CAPILLARY: Glucose-Capillary: 72 mg/dL (ref 70–99)

## 2010-05-11 LAB — BASIC METABOLIC PANEL
BUN: 55 mg/dL — ABNORMAL HIGH (ref 6–23)
CO2: 20 mEq/L (ref 19–32)
Calcium: 8.4 mg/dL (ref 8.4–10.5)
Calcium: 9.1 mg/dL (ref 8.4–10.5)
Chloride: 114 mEq/L — ABNORMAL HIGH (ref 96–112)
Creatinine, Ser: 2.1 mg/dL — ABNORMAL HIGH (ref 0.4–1.2)
GFR calc Af Amer: 29 mL/min — ABNORMAL LOW (ref >60)
GFR calc Af Amer: 44 mL/min — ABNORMAL LOW (ref >60)
GFR calc non Af Amer: 24 mL/min — ABNORMAL LOW (ref >60)
Sodium: 140 mEq/L (ref 135–145)

## 2010-05-11 LAB — COMPREHENSIVE METABOLIC PANEL
ALT: 47 U/L — ABNORMAL HIGH (ref 0–35)
Alkaline Phosphatase: 97 U/L (ref 39–117)
BUN: 55 mg/dL — ABNORMAL HIGH (ref 6–23)
CO2: 20 mEq/L (ref 19–32)
Calcium: 9.2 mg/dL (ref 8.4–10.5)
GFR calc non Af Amer: 23 mL/min — ABNORMAL LOW (ref >60)
Glucose, Bld: 98 mg/dL (ref 70–99)
Sodium: 135 mEq/L (ref 135–145)

## 2010-05-11 LAB — GLUCOSE, CAPILLARY
Glucose-Capillary: 87 mg/dL (ref 70–99)
Glucose-Capillary: 173 mg/dL — ABNORMAL HIGH (ref 70–99)
Glucose-Capillary: 259 mg/dL — ABNORMAL HIGH (ref 70–99)
Glucose-Capillary: 50 mg/dL — ABNORMAL LOW (ref 70–99)
Glucose-Capillary: 74 mg/dL (ref 70–99)

## 2010-05-11 LAB — CBC
HCT: 31.2 % — ABNORMAL LOW (ref 36.0–46.0)
Hemoglobin: 10.7 g/dL — ABNORMAL LOW (ref 12.0–15.0)
Hemoglobin: 9.8 g/dL — ABNORMAL LOW (ref 12.0–15.0)
MCH: 31.5 pg (ref 26.0–34.0)
MCHC: 34.4 g/dL (ref 30.0–36.0)
MCV: 92.5 fL (ref 78.0–100.0)
MCV: 92.8 fL (ref 78.0–100.0)
Platelets: 125 10*3/uL — ABNORMAL LOW (ref 150–400)
RBC: 3.1 MIL/uL — ABNORMAL LOW (ref 3.87–5.11)
RDW: 14.4 % (ref 11.5–15.5)
WBC: 4.1 10*3/uL (ref 4.0–10.5)

## 2010-05-11 LAB — POCT CARDIAC MARKERS: Myoglobin, poc: 193 ng/mL (ref 12–200)

## 2010-05-11 LAB — DIFFERENTIAL
Basophils Relative: 1 % (ref 0–1)
Eosinophils Absolute: 0.1 10*3/uL (ref 0.0–0.7)
Eosinophils Absolute: 0.1 10*3/uL (ref 0.0–0.7)
Lymphocytes Relative: 35 % (ref 12–46)
Lymphs Abs: 1.4 10*3/uL (ref 0.7–4.0)
Monocytes Relative: 8 % (ref 3–12)
Neutro Abs: 2.1 10*3/uL (ref 1.7–7.7)
Neutrophils Relative %: 48 % (ref 43–77)
Neutrophils Relative %: 55 % (ref 43–77)

## 2010-05-11 LAB — LIPASE, BLOOD: Lipase: 15 U/L (ref 11–59)

## 2010-05-11 LAB — WOUND CULTURE

## 2010-05-12 LAB — DIFFERENTIAL
Basophils Absolute: 0 10*3/uL (ref 0.0–0.1)
Eosinophils Relative: 1 % (ref 0–5)
Lymphocytes Relative: 44 % (ref 12–46)
Lymphs Abs: 1.8 10*3/uL (ref 0.7–4.0)
Monocytes Absolute: 0.2 10*3/uL (ref 0.1–1.0)
Monocytes Relative: 5 % (ref 3–12)
Neutro Abs: 1.9 10*3/uL (ref 1.7–7.7)

## 2010-05-12 LAB — GLUCOSE, CAPILLARY: Glucose-Capillary: 160 mg/dL — ABNORMAL HIGH (ref 70–99)

## 2010-05-12 LAB — BASIC METABOLIC PANEL
Calcium: 8 mg/dL — ABNORMAL LOW (ref 8.4–10.5)
GFR calc Af Amer: 59 mL/min — ABNORMAL LOW (ref >60)
GFR calc non Af Amer: 49 mL/min — ABNORMAL LOW (ref >60)
Glucose, Bld: 182 mg/dL — ABNORMAL HIGH (ref 70–99)
Potassium: 3.9 mEq/L (ref 3.5–5.1)
Sodium: 133 mEq/L — ABNORMAL LOW (ref 135–145)

## 2010-05-12 LAB — CBC
HCT: 29.6 % — ABNORMAL LOW (ref 36.0–46.0)
Hemoglobin: 10.3 g/dL — ABNORMAL LOW (ref 12.0–15.0)
RBC: 3.22 MIL/uL — ABNORMAL LOW (ref 3.87–5.11)
WBC: 4 10*3/uL (ref 4.0–10.5)

## 2010-05-30 LAB — GLUCOSE, CAPILLARY
Glucose-Capillary: 101 mg/dL — ABNORMAL HIGH (ref 70–99)
Glucose-Capillary: 68 mg/dL — ABNORMAL LOW (ref 70–99)

## 2010-05-31 LAB — GLUCOSE, CAPILLARY
Glucose-Capillary: 56 mg/dL — ABNORMAL LOW (ref 70–99)
Glucose-Capillary: 57 mg/dL — ABNORMAL LOW (ref 70–99)

## 2010-06-02 ENCOUNTER — Encounter (HOSPITAL_BASED_OUTPATIENT_CLINIC_OR_DEPARTMENT_OTHER)
Admission: RE | Admit: 2010-06-02 | Discharge: 2010-06-02 | Disposition: A | Discharge status: Home / Self Care | Payer: Medicaid Other | Source: Ambulatory Visit | Attending: Surgery | Admitting: Surgery

## 2010-06-02 LAB — BASIC METABOLIC PANEL
BUN: 37 mg/dL — ABNORMAL HIGH (ref 6–23)
CO2: 21 mEq/L (ref 19–32)
Chloride: 110 mEq/L (ref 96–112)
Potassium: 4.6 mEq/L (ref 3.5–5.1)

## 2010-06-03 ENCOUNTER — Other Ambulatory Visit: Payer: Self-pay | Admitting: Surgery

## 2010-06-03 ENCOUNTER — Ambulatory Visit (HOSPITAL_BASED_OUTPATIENT_CLINIC_OR_DEPARTMENT_OTHER)
Admission: RE | Admit: 2010-06-03 | Discharge: 2010-06-03 | Disposition: A | Discharge status: Home / Self Care | Payer: Medicaid Other | Source: Ambulatory Visit | Attending: Surgery | Admitting: Surgery

## 2010-06-03 DIAGNOSIS — N61 Mastitis without abscess: Secondary | ICD-10-CM | POA: Insufficient documentation

## 2010-06-03 DIAGNOSIS — E119 Type 2 diabetes mellitus without complications: Secondary | ICD-10-CM | POA: Insufficient documentation

## 2010-06-03 DIAGNOSIS — N6089 Other benign mammary dysplasias of unspecified breast: Secondary | ICD-10-CM | POA: Insufficient documentation

## 2010-06-03 DIAGNOSIS — Z01812 Encounter for preprocedural laboratory examination: Secondary | ICD-10-CM | POA: Insufficient documentation

## 2010-06-03 LAB — URINALYSIS, ROUTINE W REFLEX MICROSCOPIC
Glucose, UA: NEGATIVE mg/dL
Hgb urine dipstick: NEGATIVE
Ketones, ur: NEGATIVE mg/dL
Leukocytes, UA: NEGATIVE
pH: 5 (ref 5.0–8.0)

## 2010-06-03 LAB — GLUCOSE, CAPILLARY
Glucose-Capillary: 200 mg/dL — ABNORMAL HIGH (ref 70–99)
Glucose-Capillary: 159 mg/dL — ABNORMAL HIGH (ref 70–99)

## 2010-06-03 LAB — URINE MICROSCOPIC-ADD ON

## 2010-06-03 LAB — POCT HEMOGLOBIN-HEMACUE: Hemoglobin: 11.1 g/dL — ABNORMAL LOW (ref 12.0–15.0)

## 2010-06-05 NOTE — Op Note (Signed)
  NAMEJANNAE, [name] NO.:  [id number]  MEDICAL RECORD NO.:  [id number]            PATIENT TYPE:  LOCATION:                                 FACILITY:  PHYSICIAN:  Currie Paris, M.D.   DATE OF BIRTH:  DATE OF PROCEDURE:  06/03/2010 DATE OF DISCHARGE:                              OPERATIVE REPORT   PREOPERATIVE DIAGNOSIS:  Recurring left breast abscess, now chronic.  POSTOPERATIVE DIAGNOSIS:  Recurring left breast abscess, now chronic.  OPERATION:  Left breast ductal excision.  SURGEON:  Currie Paris, MD  ANESTHESIA:  General.  CLINICAL HISTORY:  This is a 51-year lady who is diabetic and has had several prior left breast abscesses along 12 o'clock position.  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  The patient agreed to proceed to ductal excision.  DESCRIPTION OF PROCEDURE:  I saw the patient in the holding area and she had no further questions.  I initialed the left breast as the operative side.  The patient was taken to the operating room and after satisfactory general anesthesia had been obtained the breast was prepped and draped and the time-out was done.  I made an incision at the 12 o'clock position areolar margin and took a little ellipse of skin directly over the palpable subcutaneous mass. Prior to making incision, I was able to take a tear duct probe and cannulate the duct going up into this area and actually went up into the area of the chronic abscess which now I not think had any current and active infection.  Once I made the incision, I excised the ductal tissue subcutaneously and then divided the ductus as the  tear duct took into just under the skin and removed all of what appeared to be some old chronic changes, but again no active infection or pus noted.  At this point, I then took a scalpel and excised the duct itself so that I had the duct completely excised including its skin opening.  I made sure everything was dry and then put 0.25% plain Marcaine in to help with postop pain relief.  I closed it with some 3-0 Vicryl and 4-0 Monocryl subcuticular plus Dermabond.  The patient tolerated the procedure well and there were no complications.     Currie Paris, M.D.     CJS/MEDQ  D:  06/03/2010  T:  06/04/2010  Job:  [id number]  Electronically Signed by Cyndia Bent M.D. on 06/05/2010 09:02:54 AM

## 2010-06-09 LAB — GLUCOSE, CAPILLARY

## 2010-06-24 ENCOUNTER — Other Ambulatory Visit: Payer: Self-pay | Admitting: *Deleted

## 2010-06-25 MED ORDER — PANCRELIPASE (LIP-PROT-AMYL) 24000-76000 UNITS PO CPEP: 24000.0000 [IU] | ORAL_CAPSULE | Freq: Every day | ORAL | Status: DC

## 2010-06-27 ENCOUNTER — Other Ambulatory Visit: Payer: Self-pay | Admitting: *Deleted

## 2010-06-27 NOTE — Telephone Encounter (Signed)
Pharmacy needs clarification on the dosing.  Pt was on  Creon [phone] U CER  # 300.  Directions Take 1-3 capsules with meals and snacks ( up to 10 caps daily)

## 2010-06-30 MED ORDER — PANCRELIPASE (LIP-PROT-AMYL) 24000-76000 UNITS PO CPEP: 24000.0000 [IU] | ORAL_CAPSULE | Freq: Every day | ORAL | Status: DC

## 2010-07-09 ENCOUNTER — Ambulatory Visit: Payer: Medicaid Other | Admitting: Internal Medicine

## 2010-07-11 NOTE — Op Note (Signed)
NAME:  [name], [name]                          ACCOUNT NO.:  [id number]   MEDICAL RECORD NO.:  [id number]                   PATIENT TYPE:  OIB   LOCATION:  2899                                 FACILITY:  [facility]   PHYSICIAN:  Salley Scarlet., M.D.         DATE OF BIRTH:  [birth year]   DATE OF PROCEDURE:  DATE OF DISCHARGE:  04/03/2002                                 OPERATIVE REPORT   PREOPERATIVE DIAGNOSIS:  Immature cataract, right eye.   POSTOPERATIVE DIAGNOSIS:  Immature cataract, right eye.   OPERATION:  Kelman phacoemulsification of cataract, right eye.   ANESTHESIA:  Local using Xylocaine 2% with Marcaine 0.75 and Wydase.   JUSTIFICATION FOR PROCEDURE:  This is a 60 year old lady who has had a  cataract extracted from the left eye several weeks ago.  Although the  surgery was uncomplicated, she did not develop good postoperative vision  because of amblyopia.  She still complains bitterly of being unable to see,  to get around, and she has an inability to read.  She is evaluated and found  to have a dense posterior subcapsular cataract of the right eye with a  visual acuity of best corrected at 20/70.  Since she requested that the  cataract be removed, even after the situation concerning the risks and  benefits had been carefully explained to her, she was admitted at this time  for removal of the cataract.   DESCRIPTION OF PROCEDURE:  Under the influence of IV sedation and Van Lint  akinesia, retrobulbar anesthesia was given.  The patient was prepped and  draped in the usual manner.  The lid speculum was inserted under the upper  and lower lid of the right eye and a 4-0 silk traction suture was passed  through the belly of the superior rectus muscle for retraction.  A fornix  based  conjunctival flap was turned and hemostasis was achieved by using the  cautery.  An incision was made in the sclera at the limbus.  This incision  was taken down to the clear cornea using a  crescent blade.  A side pull  incision was made in the 1:30 o'clock position.  The anterior chamber was  entered through the corneoscleral wound at the 11:30 o'clock position using  a 2.7-mm keratome.  An anterior capsulotomy was done using a bent 25-gauge  needle.  The nucleus was hydrodissected using Xylocaine.  The KPE end piece  was fastened to the eye and the nucleus was emulsified without difficulty.  The residual cortical material was aspirated.  The posterior capsule was  polished using Occucoat polisher.  The wound was widened slightly to  accommodate a foldable silicone lens.  This lens was seated into the eye  behind the iris without difficulty.  The anterior chamber was reformed and  the pupil was reset using Miochol.  The residual Occucoat was aspirated from  the eye.  The ellipse of the  wound was hydrated and tested to make sure that  there was no leak.  Ascertaining that there was no leak, the conjunctiva was  closed over the wound using thermal cautery.  One cc of Celestone and 1/2 cc  of gentamicin were injected subconjunctivally.  Maxitrol ophthalmic ointment  and polymyxin ointment were applied along with a patch and Fox shield. The  patient tolerated the procedure well and was discharged to postanesthesia  recovery room in satisfactory condition.    DISPOSITION:  She was instructed to rest today, to take Vicodin every four  hours as needed for pain, and to see me in the office tomorrow for further  evaluation.   DISCHARGE DIAGNOSIS:  Immature cataract, right eye.                                               Salley Scarlet., M.D.    TB/MEDQ  D:  04/03/2002  T:  04/03/2002  Job:  [id number]

## 2010-07-11 NOTE — Discharge Summary (Signed)
   NAMEDORRIE, COCUZZA                          ACCOUNT NO.:  [id number]   MEDICAL RECORD NO.:  [id number]                   PATIENT TYPE:  INP   LOCATION:  4708                                 FACILITY:  [facility]   PHYSICIAN:  Douglass Rivers, M.D.                DATE OF BIRTH:  05-Apr-1950   DATE OF ADMISSION:  05/19/2002  DATE OF DISCHARGE:  05/21/2002                                 DISCHARGE SUMMARY   DISCHARGE DIAGNOSES:  1. Hypertension, orthostasis likely secondary to volume depletion, resolved.  2. Type 2 diabetes.  3. History of chronic diarrhea with unclear etiology.  4. History of elevated transaminases.   DISCHARGE MEDICATIONS:  1. Glucophage 150 mg p.o. b.i.d.  2. Glucotrol 10 mg p.o. b.i.d.  3. Neurontin 300 mg p.o. t.i.d.  4. The patient was instructed to hold on restarting hydrochlorothiazide 20     mg daily until having blood strips checked in the clinic.   DIET:  Continue therapeutic diabetic diet.   FOLLOW UP:  The patient is to follow up with [facility]  within one week for blood pressure check.  Since this is a weekend, left a  message for this to be scheduled.  The patient will also need a routine  follow-up appointment with Dr. Adriana Simas for management of her diabetes and  diarrhea.   CHIEF COMPLAINT:  Dizziness   HISTORY OF PRESENT ILLNESS:  The patient is a 60 year old woman with a  history of type 2 diabetes who presented to the outpatient clinic with  complaints of three to four day history of dizziness that was worse with  standing.  Dizziness was described as being very lightheaded and unstable.                                                Douglass Rivers, M.D.    CH/MEDQ  D:  05/21/2002  T:  05/22/2002  Job:  [id number]

## 2010-07-11 NOTE — Op Note (Signed)
[name], [name] NO.:  [id number]   MEDICAL RECORD NO.:  [id number]          PATIENT TYPE:  INP   LOCATION:  4708                         FACILITY:  [facility]   PHYSICIAN:  Vania Rea. Supple, M.D.  DATE OF BIRTH:  May 28, 1950   DATE OF PROCEDURE:  03/04/2006  DATE OF DISCHARGE:                               OPERATIVE REPORT   PREOPERATIVE DIAGNOSIS:  Right foot subcutaneous plantar abscess.   POSTOPERATIVE DIAGNOSIS:  Right foot subcutaneous plantar abscess.   PROCEDURE:  Exploration, irrigation and debridement of right foot  subcutaneous plantar abscess.   SURGEON:  Vania Rea. Supple, M.D.   [name], [name]   ANESTHESIA:  General endotracheal.   ESTIMATED BLOOD LOSS:  Minimal.   SPECIMENS:  Cultures were taken for aerobic and anaerobic cultures.   DRAINS:  None.  The wound was packed open.   HISTORY:  [name] is a 60 year old, insulin-dependent diabetic  admitted with complaints of persistent right foot pain, as well as  swelling, which initially had been thought to represent gouty arthritis.  She has had increasing pain in the right foot with recent MRI scan  confirming a large subcutaneous abscess.  She is developing a septic  picture and is brought to the operating room urgently this evening for  planned exploration and irrigation and debridement of her right foot  abscess.   We counseled [name] on the treatment options as well as risks,  benefits, and possible surgical complications including infection,  neurovascular injury, persistence of pain as well as persistent  infection and possible need for additional surgery including the  potential for amputation were reviewed.  She understands and accepts and  agrees for planned procedure.   PROCEDURE IN DETAIL:  After undergoing a routine preop evaluation, the  patient was continued on the antibiotics, which had already been  started.  She was brought to the operating room  and placed supine on the  operating table and underwent the smooth induction of a general  endotracheal anesthesia.  Tourniquet applied to the right thigh.  The  right leg was sterilely prepped and draped in standard fashion.  The  tourniquet was not used.  The foot showed a fluctuant area along the  plantar and medial aspects of the midfoot and forefoot approximately 15  cm in length.  A longitudinal medial plantar incision was then made and  centered over the fluctuant region, and once the skin was divided, a  large amount of brownish purulent fluid was encountered.  There was  sloughing of the superficial skin layers, and a sharp debridement was  performed of the devitalized skin and subcutaneous tissues.  The  dissection was then carried deeply and the subcutaneous layers were  explored and opened and multiple loculations were divided.  The plantar  fascia was identified and this was intact.  The MRI scan has confirmed  that the infection was entirely plantar to the plantar fascia.  We  completely opened up the abscessed cavity with blunt dissection.  Necrotic tissue was debrided.  There was a large callous over  the  plantar aspect of the first metatarsophalangeal joint with an underlying  ulcer and this appears to have been the source of the abscess.  This  area was completely debrided and the callous was removed.   Once we had thoroughly debrided and completely exposed and opened up the  entire abscess cavity, we then used pulsatile lavage to meticulously  clean the tissues.  A normal saline damp-to-dry dressing was then packed  into the wound.  The foot was then wrapped with a bulky dry dressing.  The patient was then transferred to the recovery room in stable  condition.      Vania Rea. Supple, M.D.  Electronically Signed     KMS/MEDQ  D:  03/05/2006  T:  03/05/2006  Job:  [id number]

## 2010-07-11 NOTE — Op Note (Signed)
[name], [name]                ACCOUNT NO.:  [id number]   MEDICAL RECORD NO.:  [id number]          PATIENT TYPE:  AMB   LOCATION:  SDS                          FACILITY:  [facility]   PHYSICIAN:  Thornton Park. Daphine Deutscher, MD  DATE OF BIRTH:  [birth year]   DATE OF PROCEDURE:  12/12/2004  DATE OF DISCHARGE:  12/12/2004                                 OPERATIVE REPORT   PREOPERATIVE DIAGNOSIS:  Chronic cholecystitis.   POSTOPERATIVE DIAGNOSES:  1.  Chronic cholecystitis.  2.  Normal intraoperative cholangiogram.  3.  Nodule growing in the right internal ring, etiology uncertain.   PROCEDURE:  Laparoscopic cholecystectomy and intraoperative cholangiogram.   SURGEON:  Thornton Park. Daphine Deutscher, M.D.   ASSISTANT:  Marcille Blanco, M.D.   DESCRIPTION OF PROCEDURE:  [name] was taken to room [facility] at [facility] on December 12, 2004 and given general anesthesia.  The abdomen was prepped with  Betadine and draped sterilely.  I entered through the umbilicus without  difficulty using Hasson technique and then placed 3 trocars in the upper  abdomen.  The gallbladder was grasped, elevated and numerous chronic  adhesions were taken down from the gallbladder neck, identifying a fairly  long cystic duct and cystic artery.  I then did an intraoperative  cholangiogram which showed good intrahepatic filling, but the duct was  slightly dilated and there seemed to be a transition zone down to the  pancreas, possibly from [name] pancreatitis, but with free flow into the  duodenum and retrograde filling of the pancreatic duct, which could possibly  given biliary pancreatitis and explanation for [name] pancreatitis.   The cystic duct was then triple-clipped and divided.  Cystic artery was  triple-clipped and divided the gallbladder was removed from the gallbladder  bed without entering it and without bleeding or bile leaks noted from the  gallbladder bed.  The gallbladder was placed in a bag and brought through  the umbilicus.  I went  ahead and closed the umbilical defect under direct  vision.  I look down in the pelvis and saw a nodule which looked a little  unusual and then photographed it and put in the chart and I found this to be  in the right inguinal region were an internal hernia would be occurring.  I  was [name] uterus with a fibroid and [name] right tube seemed to be stuck down  with [name] ovary in the pelvis.  There was no other peritoneal studding.  Rather and try to do a biopsy of this, I want to do a workup as an  outpatient, but this will undoubtedly need a pelvic laparotomy with excision  and staging, in light of some of chronic weight loss that she has had.   The abdomen was deflated.  The patient had Marcaine placed in the incisions  and she was taken to the recovery room in satisfactory condition.  The  patient had desired to go home the same day and an attempt will be made to  discharge [name].      Thornton Park Daphine Deutscher, MD  Electronically Signed  MBM/MEDQ  D:  12/12/2004  T:  12/13/2004  Job:  [id number]

## 2010-07-11 NOTE — Op Note (Signed)
NAME:  [name], [name]                          ACCOUNT NO.:  [id number]   MEDICAL RECORD NO.:  [id number]                   PATIENT TYPE:  OIB   LOCATION:  NA                                   FACILITY:  [facility]   PHYSICIAN:  Salley Scarlet., M.D.         DATE OF BIRTH:  [birth year]   DATE OF PROCEDURE:  01/26/2002  DATE OF DISCHARGE:                                 OPERATIVE REPORT   PREOPERATIVE DIAGNOSIS:  Immature cataract, left eye.   POSTOPERATIVE DIAGNOSIS:  Immature cataract, left eye.   OPERATION:  Phacoemulsification of cataract left eye with intraocular lens  implantation.   ANESTHESIA:  Local using Xylocaine 2% with Marcaine 0.75% and [place].   JUSTIFICATION OF PROCEDURE:  This is a 60 year old diabetic lady who  complains of inability to see from the left eye.  She was evaluated and  found to have a visual acuity best corrected to 20/70 on the right and  20/300 on the left.  There were bilateral posterior capsular cataracts,  worse on the left than the right.  Cataract extraction with intraocular lens  implantation is recommended.  She is admitted at this time for the  procedure.   PROCEDURE:  Under the influence of IV sedation,  akinesia and retrobulbar  anesthesia was given.  The patient was prepped and draped in the usual  manner.  The lid speculum was inserted under the upper and lower lid of the  left eye and a 4-0 silk traction suture was passed through the belly of the  superior rectus muscle for retraction.  A fornix based conjunctival flap was  turned and hemostasis achieved using cautery.  An incision was made in the  sclera 1 mm posterior to the limbus.  This incision was dissected down to  clear cornea using a fresh blade.  A side port  incision was made at the  1:30 o'clock position and was injected into the eye through this side port  incision.  The anterior chamber was then entered through the corneoscleral  tunnel incision at the 11:30 o'clock  position and an anterior capsulotomy  was bent 25 gauge needle.  The nucleus was hydrodissected using Xylocaine.  The PE handpiece was passed into the eye and the nucleus was emulsified  without difficulty.  The residual cortical material was aspirated.  The  posterior capsule was then polished using  polisher.  The foldable silicon  lens was then seated into the eye behind the iris without difficulty.  The  anterior chamber was reformed using Miochol.  The residual OcuCoat was  aspirated from the eye.  The lips of the wound were hydrated and then tested  to make sure there was no leak.  Ascertaining there was no leak, the  conjunctiva was closed over the wound using thermal cautery.  1 cc of  Celestone and 0.5 cc of gentamicin were injected subconjuctivally.  Maxitrol  ophthalmic ointment and Xylocaine  ointment were applied along with a patch  and a Fox shield.  The patient tolerated the procedure well and was  discharged in satisfactory condition with instructions to rest today, to  take Vicodin every 4 hours as needed for pain, and to see me in the office  tomorrow for further evaluation.   DISCHARGE DIAGNOSIS:  Immature cataract, left eye.                                               Salley Scarlet., M.D.    TB/MEDQ  D:  01/26/2002  T:  01/26/2002  Job:  [id number]

## 2010-07-11 NOTE — Assessment & Plan Note (Signed)
[facility]                           GASTROENTEROLOGY OFFICE NOTE   GENELDA, ROARK                       MRN:          [id number]  DATE:10/12/2005                            DOB:          Feb 23, 1951    HISTORY:  The patient presents today for follow-up.  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  She was evaluated August 12, 2005, upon referral regarding  elevated liver function tests.  See that dictation for details.  At that  time her elevated liver function tests were in a cholestatic pattern with  mild dilation of the bile duct on ultrasound in March 2007.  There was  concern she may have partial obstruction of the distal bile duct secondary  to chronic pancreatitis.  Tumor or stones seemed less likely though needed  to be excluded.  She also complained of constipation and rectal bleeding.  She underwent complete colonoscopy on September 07, 2005.  This was normal.  CT  scan of the abdomen revealed changes of chronic pancreatitis with stable  dilation of the pancreatic duct.  There was no obstructing mass seen and no  significant biliary dilation.  Her only complaint today is discomfort from  external hemorrhoids and constipation.  She continues to abstain from  alcohol.   PHYSICAL EXAMINATION:  GENERAL:  A well-appearing female in no acute  distress.  VITAL SIGNS:  Blood pressure 100/50, heart rate 62, weight is 141.8 pounds.  ABDOMEN:  Soft without tenderness, mass or hernia.  RECTAL:  External hemorrhoids are not inflamed but moderate in size.  EXTREMITIES:  Without edema.   IMPRESSION:  1. Elevated liver function tests in a cholestatic pattern.  No evidence of      pancreatic mass or significant biliary dilation.  2. Functional constipation.  3. Chronic calcific pancreatitis secondary to chronic  alcohol abuse with      resultant steatorrheic diarrhea due to pancreatic insufficiency.      Stable on pancreatic enzyme supplementation.  4. Status post cholecystectomy with negative intraoperative cholangiogram      October 2006.  5. Multiple general medical problems followed at the outpatient clinic.   RECOMMENDATIONS:  1. GlycoLax 17 g in 10 ounces of water daily as needed for constipation.  2. Expand laboratory studies to exclude other causes for chronic elevation      of liver tests.  3. GI follow-up in about 6 months.  4. Resume general medical care at the outpatient clinic.                                  Wilhemina Bonito. Eda Keys., MD   JNP/MedQ  DD:  10/12/2005 DT:  10/13/2005 Job #:  [id number]   cc:   Dennis Bast, MD

## 2010-07-11 NOTE — Discharge Summary (Signed)
[name], [name] NO.:  [id number]   MEDICAL RECORD NO.:  [id number]          PATIENT TYPE:  INP   LOCATION:  5014                         FACILITY:  [facility]   PHYSICIAN:  [name], [name]     DATE OF BIRTH:  07-May-1950   DATE OF ADMISSION:  02/22/2006  DATE OF DISCHARGE:  02/24/2006                               DISCHARGE SUMMARY   DISCHARGE DIAGNOSES:  1. Acute gout in right lower extremity.  2. History of diabetes type 2, insulin dependent with retinopathy,      peripheral neuropathy and nephropathy with chronic renal      insufficiency at a baseline creatinine of 1.8.  3. Chronic pancreatitis secondary to alcohol abuse.  4. Alcohol abuse.  5. History of anemia.  6. History of chronic diarrhea (Dr. Marina [name]).  7. Hypertension.  8. History of bronchitis, uses an inhaler.   DISCHARGE MEDICATIONS:  1. Allopurinol 100 mg p.o. daily.  2. Prednisone taper 50 mg x2 days, then 40 mg, then 30, then 20, then      10 mg decreasing by 10 mg every day until complete.  3. Lisinopril 20 mg every day.  4. Glipizide 10 mg b.i.d.  5. Lantus 15 units subcutaneous at bedtime every day.  6. Percocet 5/325 mg 1-2 q.4 hours p.r.n. pain, dispensed 50 for gout      pain.  Prescriptions were given for all of these medications.   FOLLOWUP APPOINTMENTS:  Patient is scheduled to follow up with Dr.  Luiz [name], her primary care physician in the outpatient clinic here within  4 weeks.  The patient will be contacted, as this appointment was made  through the online service.  At this followup appointment, please check  a BMET if one has not already been done after hospital discharge due to  the fact that patient did have some isolated elevated potassium levels  at 5.5 on the day of discharge, which was most likely related to a  elevated blood glucose level of 460 after the addition of prednisone.  A  recheck 4 hours later after insulin was given produced a blood sugar of  200 and the  potassium was down at 4.8.  The potassium may have also been  related to the addition of ACE inhibitor, although it has been a  longterm medication for the patient.  She may not have been taking it.  Please followup if the patient has had any more episodes of gout or  lower extremity pain.  Also, please check that the allopurinol has been  working well for her and please followup on her diabetes care.   PROCEDURES PERFORMED DURING THIS ADMISSION:  1. Arterial venous Doppler on February 22, 2006 showed no evidence of      a DVT, superficial thrombus or Bakers cyst in the right lower      extremity.  There is an enlarged inguinal lymph node that is      visualized.  2. February 23, 2006, x-ray of right foot showed no acute abnormality.  3. Repeat Doppler on February 24, 2006, there is no evidence  of deep or      superficial vein thrombosis of the right lower extremity.  Enlarged      lymph node noted in right groin.   CONSULTATIONS:  None.   BRIEF ADMITTING H&P FOR THIS PATIENT:  This is a 60 year old African  American woman who presents with an approximate 1 week history of right  foot, ankle and up to the calf swelling with increased pain up to a  10/10, as well as the extremity being warm to the touch.  The patient  noticed her right foot swelling on Christmas Eve, December 24, and then  on December 25, she began to notice the pain as well.  The pain is  intense, continuous and relieved slightly with Tylenol, although it is  worse with ambulation and better with rest.  There is no history of rash  or erythema.  There is a question of the patient bumping into a bedpost  some days before this presented.  It should also be noted that the pain  initially began in the patient's first MTP joint, although this has now  subsided and is involving mainly her right foot and ankle, as well as  her lower extremity up to the mid calf area.   ALLERGIES:  NO KNOWN DRUG ALLERGIES.   HOME  MEDICATIONS:  1. Lisinopril 10 mg every day.  2. Glipizide 10 mg, unknown frequency.  3. Lantus 15 mg q.h.s.  4. Lasix as needed.   PHYSICAL EXAMINATION:  VITAL SIGNS:  Temperature is 99.8.  Blood  pressure 115/54.  Pulse of 60.  Respiratory rate is 18.  Saturating 100%  on room air.  Weight is 150 pounds.  GENERAL:  The patient is in no acute distress.  HEENT:  Eyes:  Extraocular muscles intact.  Pupils equally round and  reactive to light and accommodation.  ENT:  The oropharynx is clear with  moist mucous membranes.  NECK:  Supple.  RESPIRATORY:  Clear to auscultation bilaterally with good air movement.  CARDIOVASCULAR:  Regular rate and rhythm with a 3/6 systolic ejection  murmur, nonradiating to carotids or axilla.  GI:  Abdomen is soft, nontender, nondistended with positive bowel  sounds.  EXTREMITIES:  Show right ankle edema that progresses up to the calf,  which is very painful to touch.  There is increased temperature when  compared to the left leg.  There is no evidence of skin broken, although  there are some callus buildup on the plantar surface of the feet.  There  is no rash.  No decreased sensation in the feet.  MUSCULOSKELETAL:  Full range of motion.  NEUROLOGICALLY:  Cranial nerves II-XII are intact.  Babinski is  downgoing.  Cerebellar function is intact from finger to nose.  Muscle  strength is 5/5 bilaterally.  PSYCH:  Appropriate.   INITIAL LABS:  Sodium 138, potassium 4.9, chloride 108, bicarb 19, BUN  59, creatinine 1.3, glucose 89.  White count 7.6, hemoglobin 10.4,  platelets 184, ANC is 5.2, MCV is 91.  D-dimer is 0.91.  PT is 14.8, INR  1.1, PTT 37.  Initial lower extremity Doppler is negative for any DVT.  Calcium 9.0.   HOSPITAL COURSE:  1. Patient's right lower extremity pain.  The affected extremity is      noticeably tender to palpation, although the patient does not have     much pain when the extremity is held at rest.  It is slightly warm       to  the touch.  Although when I examined the patient, I was not able      to disconcert much swelling or edema and I rated it as a trace      edema in this right lower extremity.  There were no tortuous      vessels or Hoffman sign was negative.  Also, the Doppler was      negative.  Initial admitting physician was concerned with DVT      versus cellulitis type of picture and also decided to place the      patient on vancomycin.   When I examined the patient the following day (as night float admitted  this patient) I did not feel that her physical exam was consistent with  an underlying diagnosis of a DVT or cellulitis.  I was, in fact, much  more concerned with a gout or other form of crystal deposition  arthritis.   Given the patient fact that the patient's pain initially started on her  left first MTP, and she has had this pain before, which resolved  spontaneously and given the fact that she is a known alcoholic and her  drink of choice is beer combined with a physical exam consistent with  gout, I believe this patient has gout at this time.   I checked a serum ureic acid, which was elevated as well, corresponding  with this diagnosis.  The pertinent negatives include no white count,  patient remained afebrile and 2 Doppler studies of the right lower  extremity was negative for any clot.   Given that the patient does have some mild underlying chronic renal  insufficiency, we held against NSAIDs, including ibuprofen or  colchicine, which could possibly have some toxicity to the kidneys.  Instead, we initially started the patient on a prednisone dose of 60 mg  every day and she will be titrated off of prednisone as an outpatient.  We noticed that after 1 day of the initial dose of prednisone, the  patient was markedly improved, although she still did have some  tenderness to palpation in this right lower extremity.  We did not  perform any joint aspiration because the tenderness to  palpation in the  right lower extremity was quite generalized.  It was most effected in  her ankle; however, given her severity of peripheral neuropathy this  likely complicated the picture inducing a pain that could not be  specifically localized.   1. Chronic renal insufficiency.  This remained stable for the patient      throughout her admission and creatinine on day of discharge was      1.3.  We did hold on an NSAIDs and instead chose prednisone for      treatment of her gout.   1. Diabetes.  Patient was placed on glipizide 10 mg b.i.d. and Lantus      15 mg q.h.s., as well as a NovoLog sliding scale while in the      hospital.  Her sugars did shoot up after the addition of the      prednisone with a BMET glucose of 460.  She was given an additional      15 units of insulin and subsequently BMET was checked 4 hours      later, which showed a blood glucose in the low 200s at that time.     It should also be noted that the patient's potassium was 5.5 when      it was checked  with the elevated blood sugar of 460, but went down      to 4.8 on the recheck with a lower sugar.  I have advised on      followup exam in the clinic to check a BMET as well.   1. Hypertension.  The patient was stable on lisinopril, as well as 40      mg of Lasix.   1. Peripheral neuropathy.  Peripheral neuropathy was quite stable      throughout the admission and was maintained on Neurontin t.i.d. at      home.   DISCHARGE DAY LABS AND VITALS:  Temperature was 98.0, blood pressure  153/71, pulse 71, respiratory 16, saturating 99% on room air.  White  count was 8.9, hemoglobin 10.2, platelets 198.  Uric acid was 7.7, down  from 8.8.  Hemoglobin A1c was 8.0.  D-dimer 0.91.  Sodium 131, potassium  4.8, chloride 102, CO2 21, glucose 216, BUN 41, creatinine 0.9.      [name], M.D.  Electronically Signed     ______________________________  [name], [name]    AS/MEDQ  D:  02/24/2006  T:   02/24/2006  Job:  [id number]   cc:   Outpatient Clinic

## 2010-07-11 NOTE — Discharge Summary (Signed)
[place]. [facility]  Patient:    [name], [name]                       MRN: [id number] Adm. Date:  [id number] Disc. Date: 01/23/00 Attending:  Sharren Bridge CC:         [facility].   Discharge Summary  DATE OF BIRTH:  [birth year]  DISCHARGE DIAGNOSES: 1. Right foot diabetic ulcer with cellulitis. 2. Uncontrolled diabetes mellitus type 2, with retinopathy. 3. Volume depletion, resolved. 4. Normocytic anemia. 5. Tobacco abuse in the amount of one pack per day x 30 years. 6. Transiently elevated hepatic enzymes - idiopathic, resolved.  DISCHARGE MEDICATIONS: 1. Augmentin 500 mg one t.i.d. for nine days, then stop. 2. Actos 40 mg q.d. 3. Avapro 150 mg q.d. 4. Aspirin 81 mg q.d. 5. Neurontin 300 mg q.h.s. 6. Amaryl 4 mg q.a.m. 7. Darvocet-N 100 one to two tablets q.6h. p.r.n. pain. 8. Atarax 25 mg one q.h.s. p.r.n. itching.  FOLLOWUP: 1. The patient will follow up in the [facility] on    January 28, 2000, at 1:30 p.m.  Ongoing care of her right lower extremity    abscess will be provided at this clinic. 2. The patient will report to [facility] for ongoing general medical care.  She is set up with an    appointment to re-establish her illegibility on March 09, 2000, at    3 p.m.  An earlier appointment was unable to be arranged, in that    the patient had been negligent in maintaining her illegibility through    the [facility].  HISTORY OF PRESENT ILLNESS:  [name] is a pleasant 61 year old female who presented to the hospital on the date of admission with a two-day history of right foot swelling, and subjective fever, chills, and nausea.   The patient described a fairly mild trauma to the right foot against a chair leg three days prior to admission.  HOSPITAL COURSE: #1 - RIGHT LOWER EXTREMITY CELLULITIS:  Admission examination  revealed a 2.0 cm x 1.0 cm ulcer at the base of the fifth metatarsal phalangeal joint of the right foot.  There was clear drainage and warmth was present throughout the foot and distal region at the tibia.  Plain films of the right foot were negative for osteomyelitis and no subcutaneous gas was appreciated.  Given the patients diabetes mellitus, it was felt that this most likely represented infection with mixed flora, and the patient was therefore placed on Zosyn for empiric IV antibiotic coverage.  A surgical consultation was requested, and the patient was seen by Dr. Molli Hazard B. Daphine Deutscher.  On January 19, 2000, a bedside debridement was carried out by Dr. Daphine Deutscher, and from that point the patient was treated simply with dressing changes and antibiotic care.  By January 20, 2000, the patient was converted to p.o. Augmentin, and arrangements were made to ensure that the patient had appropriate followup in the [facility] for her ongoing ulcer care.  By January 23, 2000, these arrangements were made, and the patient was proven to tolerate her Augmentin without difficulty.  The patient was afebrile.  Her vital signs were stable, and she was cleared for diabetes mellitus.  #2 - DIABETES MELLITUS:  On admission it was anticipated that the patients blood sugar would continue to be poorly controlled, because of  her infection. She was placed on Amaryl and Actos, and sliding scale insulin was given before meals and at bedtime, to provide additional care.  A CBG machine was provided to the patient, and a diabetic referral for teaching was accomplished during the hospital stay.  A urinalysis was obtained to screen for growth of proteinuria, and was in fact negative.  The patient was empirically treated with Avapro 150 mg p.o. q.d. for renal protection, in the setting of uncontrolled diabetes mellitus.  Her CBGs were followed closely throughout her hospitalization, and additional doses of  sliding scale insulin were given as needed, to maintain the appropriate CBG, to maximize the wound care.  The patient tolerated her Actos and Amaryl without difficulty, and is to be discharged on these medications.  Additionally, she is to instructed to begin aspirin q.d. for its benefits in helping prevent atherosclerotic complications.  Unfortunately the patient has been noncompliant with maintaining her illegibility at [facility], and therefore followup will not be available for some time.  This was explained to the patient, as was the need for her to maintain illegibility, and to accept the responsibility of her ongoing care at [facility].  She was connected with an appointment for March 09, 2000, at 3 p.m. in the afternoon she will be allowed to reinstitute her illegibility at [facility].  Ongoing care concerning her diabetes mellitus will be provided through this clinic.  #3 - SEVERE VOLUME DEPLETION:   At the time of admission the physical examination suggested significant volume depletion.  This was felt to be due to decreased p.o. intake due to the infection, as well as hyperglycemia as a result of her known foot infection.  She was treated with IV hydration and this resolved nicely during her hospitalization.  At the time of discharge she was ambulating in her room and in the hallway without significant difficulty, with no symptoms.  #4 - NORMOCYTIC ANEMIA:  At the time of admission the patient was found to have a hemoglobin of 11.4, with MCV of 95.  Guaiacs were obtained during the hospitalization, and did prove to be negative.  Ferritin was found to be 91, with a total iron binding capacity of 317.  This was all consistent with a chronic anemia, likely due to malnourished state and inflammation from the infection.  It is recommended that the patients anemia be followed up on an outpatient basis when she reports for her first visit at [facility].  #5 -  ELEVATED HEPATIC ENZYMES:  At the time of admission the patient was found to have mildly elevated transaminases with SGOT of 73 and SGPT of 81, as well  as an alkaline phosphatase of 97, total bilirubin of 0.2.  There was no clear etiology of this laboratory finding, and an abdominal examination was without significant finding.  Hepatitis-B and hepatitis-C panels were obtained, and were in fact negative.  It is possible that this could be related to alcohol use.  The patient was advised that she should abstain from alcohol because of the possibility that she had experienced a slight amount of alcoholic hepatitis.  #6 - HISTORY OF TOBACCO ABUSE:  During the hospitalization the patient was educated as to the deleterious effects of ongoing tobacco abuse, and advised that she should discontinue use of tobacco products.  CONDITION ON DISCHARGE:  At the time of discharge the patient was ambulating well, and pain was significantly improved at the site of the ulcer.  There is no surrounding erythema, and improvement of  the wound was appreciated.  INSTRUCTIONS:  Will be as above, and the importance of maintaining close medical followup was impressed upon the patient at the time of discharge. DD:  03/03/00 TD:  03/03/00 Job: [id number]

## 2010-07-11 NOTE — Discharge Summary (Signed)
[name], [name] NO.:  [id number]   MEDICAL RECORD NO.:  [id number]          PATIENT TYPE:  INP   LOCATION:  5014                         FACILITY:  [facility]   PHYSICIAN:  Manning Charity, MD     DATE OF BIRTH:  Sep 11, 1950   DATE OF ADMISSION:  02/22/2006  DATE OF DISCHARGE:  02/24/2006                               DISCHARGE SUMMARY   DISCHARGE DIAGNOSES:  1. Right lower extremity edema and abscess proven on MRI, status post      debridement by orthopedics.  2. Diabetes type 2 complicated with retinopathy, neuropathy and      nephropathy with baseline creatinine of 1.8 and highest creatinine      of 0.98 in January of 2008 and last hemoglobin A1c of 8.  Patient      is on insulin.  3. Anemia of chronic disease.  4. History of alcohol abuse.  5. History of chronic pancreatitis.  6. History of increased liver function enzymes.  7. Status post cholecystectomy.  8. Status post colonoscopy in 2006, negative.  9. Hypertension.  10.Cataract surgery in both eyes.  11.Osteoporosis by DEXA scan.  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  13.LVH.  14.Reactive airway disease.   DISCHARGE MEDICATIONS:  1. Lisinopril 20 mg p.o. daily.  2. Glypizide 10 mg p.o. b.i.d.  3. Lantus 15 units subcu nightly.  4. Percocet 5 per 325, one to two tabs q.6 h. p.r.n. pain, dispensing      #80.  5. Pancrease, 16, one tab q.a.c.  6. Vancomycin injections 1 g IV q.12 h. for 14 days.  7. Zosyn injections 3.375 g IV q.6 h. for 14 days.   DISPOSITION/FOLLOWUP:  The patient is to follow up with Dr. Luiz Iron in  two weeks in the outpatient clinic.  Clinic scheduling software was not  functional at this time and therefore the appointment was made online.  Also patient is to follow up with orthopedics, with Dr. Rennis Chris, in two  weeks.  Phone number there is [phone].  Please follow up on the status  of the patient's right lower extremity and determine if it has continued  to improve with the 14 days of vancomycin and Zosyn therapy, as well as  continued Wound Vac care as requested by the wound care nurse and  orthopedics team.  You may also wish to follow up on the patient's  diabetes regimen as her sugars were quite labile during this admission.  Also please follow up on blood pressure and determine if lisinopril 20  is still necessary or an appropriate dose for this patient.   PROCEDURE PERFORMED:  1. There was an operative report dated March 04, 2006 showing right      foot subcutaneous plantar abscess, status post incision and      debridement.  2. X-ray of the right foot, March 04, 2006, showing diffuse      subcutaneous edema in  the ankle.  No acute bony findings, foreign      body or gas identified in the soft tissues.  3. March 05, 2006, AV Doppler which showed no evidence of DVT or      superficial thrombus or baker's cyst in the right lower extremity.  4. MRI of the right foot showing a large abscess.   CONSULTATIONS:  Dr. Rennis Chris of orthopedics.   BRIEF ADMITTING HISTORY AND PHYSICAL:  This is a 60 year old African  American woman who presents to the [facility] for  right lower extremity swelling, as well as warmth and erythema and  tenderness to palpation.  The patient first noticed the swelling  approximately one day after her previous discharge, which was  approximately one week prior to admission, but the swelling became  significantly worse along with worsening pain over the past 24 hours  before admission.  Patient claims tenderness to palpation in the entire  right lower extremity below the knee.   She had no fever or positive chills last night.  No shortness of breath.  No chest pain except for an approximately 2 second episode last night in  the left lower chest.   Pain was relieved with albuterol.  No  palpitations or diaphoresis.   HOME MEDICATIONS:  1. Allopurinol 100.  2. Prednisone taper.  3. Lisinopril 20.  4. Glypizide 20 b.i.d.  5. Lantus 15 units nightly.  6. Percocet.  7. Pancrease 16.   LABORATORY DATA:  Initial labs:  Aspirate of the abscess showed white  blood cell count of 191,500 with segmented neutrophils at 99.  Gram  stain showed abundant white cells and no organisms.   HOSPITAL COURSE BY PROBLEM:  1. Right lower extremity abscess with associated edema, warmth and      tenderness to palpation.  On previous admission, less than one      month prior to this visit, the patient was worked up for an      apparent gout exacerbation.  She had all the findings consistent      with gout and we felt comfortable with sending her home on      prednisone taper.  Today on admission and throughout the patient's      course, she had a completely different presentation.  She did not      appear anything like her previous visit and the symptoms were      completely different.   We subsequently got an MRI of right lower extremity which did show an  abscess.  We checked blood cultures and cultures of the abscess, which  was later aspirated, all of which returned negative.  We did call for an  orthopedics consult with Dr. Rennis Chris who came by and evaluated the  patient and decided to take the patient to the operating room for  incision and debridement of this abscess.  The patient was begun on  vancomycin and Zosyn prior to surgical procedure and therefore the  cultures that were drawn by the surgeon during the operation may have  been negative due to the fact that the patient had received  approximately 12 hours of antibiotic therapy at that point.  However,  the aspirate was consistent with infectious etiology given the large  amount of white blood cells and overwhelming majority of them being  neutrophils.  We also put the patient on  morphine for pain during her stay and she was  later transferred over to a Percocet p.o. regimen  on the day of  discharge.   At the time of discharge, the patient had a PICC line placed, so  therefore she could receive approximately 14 additional days of  vancomycin and Zosyn at home at the dosing per pharmacy which was 1 g  vancomycin q.12 h. and Zosyn at 3.375 g q.6 h., both for 14 days total.  Patient was also sent home with a Wound Vac as recommended by the wound  care nursing staff as well as orthopedics.   1. Diabetes.  Patient was on Lantus at home dosing and sliding scale      insulin during her visit.  She did have an episode of hypoglycemia      on the evening after the surgery, which was likely due to the fact      that the patient was n.p.o. while still receiving her insulin      versus poor p.o. intake prior to hospital admission.  Nonetheless,      we decreased her NovoLog sliding scale back to a sensitive scale,      which seemed to be the appropriate dosing for her.   1. Hypertension.  We did hold lisinopril throughout the patient's      admission because her blood pressure was doing fine without it,      although at the time of discharge we felt the patient should resume      her home medication and we restarted the low dose of lisinopril.   1. History of anemia.  We checked iron studies and peripheral smear      throughout the stay.  FOBT was pending at time of discharge.      However, the patient does have a long history of anemia of chronic      disease and therefore we felt comfortable sending her home with a      slightly decreased hemoglobin.   1. Systolic ejection murmur.  We drew blood cultures to see if they      were positive in this patient.  They were negative, and if they      were positive we would have done a TEE to rule out any endocarditis      or septic focus.   1. Asthma.  The patient was maintained well on albuterol throughout      her  stay.   1. Chronic pancreatitis.  The patient was given Pancrease at her home      dosing, which seemed to help her well throughout the admission.   1. For history of alcoholism, the patient was given thiamine and      folate.   DISCHARGE DAY LABS AND VITALS:  Temperature is 97.1, blood pressure  134/70, pulse 78, respiratory rate 18, CBG 197, saturating 98% on room  air.  White count 441, hemoglobin 8.5, platelets 246, sodium 136,  potassium 4.2, chloride 105, bicarb 24, BUN 22, creatinine 1.1, glucose  BMET 44 (the 44 was corrected after this with 1 am of D50 and insulin  regimen medications were changed).  Chest x-ray day of discharge:  Minimal bilateral atelectasis.  Abscess cultures were negative and that  was final at the time of discharge.      Thereasa Solo, M.D.  Electronically Signed     ______________________________  Manning Charity, MD    AS/MEDQ  D:  03/09/2006  T:  03/10/2006  Job:  [id number]   cc:   Dennis Bast, MD Vania Rea. Supple, M.D.

## 2010-07-11 NOTE — Discharge Summary (Signed)
[name], [name]                          ACCOUNT NO.:  [id number]   MEDICAL RECORD NO.:  [id number]                   PATIENT TYPE:  REC   LOCATION:  FOOT                                 FACILITY:  [facility]   PHYSICIAN:  [name], M.D.               DATE OF BIRTH:  1950-10-10   DATE OF ADMISSION:  06/06/2002  DATE OF DISCHARGE:  05/21/2002                                 DISCHARGE SUMMARY   CONTINUATION   Previously dictated 05/22/2002, Job Number [id number].   DISCHARGE DIAGNOSES:  As previously dictated.   DISCHARGE MEDICATIONS:  As previously dictated.   FOLLOW UP:  As previously dictated.   CHIEF COMPLAINT:  Dizziness.   HISTORY OF PRESENT ILLNESS:  This patient is a 60 year old woman with  history of type 2 diabetes, who presented to the outpatient clinic with  complaint of three to four-day history of dizziness that was worse with  standing.  Dizziness was described as being lightheaded and patient feeling  unstable on her feet. She admits to poor p.o. intake for a few days and has  had no appetite, stating food doesn't taste like anything.  Denies any  chest pain, shortness of breath, or other symptoms.  She states that her  blood sugars have been approximately 100 but has not checked in a few days.  In outpatient clinic, blood sugar measures 77.  She had a previous episodes  of this, most recently several months ago but was not brought to medical  attention.   ALLERGIES:  No known drug allergies.   PAST MEDICAL HISTORY:  1. Diabetes, type 2, not under good control with hemoglobin A1C of 7.2 on     March 26.  She does have neuropathy and retinopathy.  2. Chronic diarrheal of unclear etiology which has been evaluated by Dr.     Marina Goodell.  3. History of elevated transaminase.   MEDICATIONS:  1. Glucophage 350 b.i.d.  2. Glucotrol 10 mg b.i.d.  3. Hydrochlorothiazide 25 mg daily.  4. Neurontin 300 mg t.i.d.  5. Ibuprofen p.r.n. pain.   SOCIAL HISTORY:  She  is a current smoker x 30 years.  Denies alcohol or  cocaine.   REVIEW OF SYSTEMS:  Denies per HPI.   PHYSICAL EXAMINATION:  VITAL SIGNS:  Pulse was initially 103, went to 87.  Blood pressure 89/55 and went to 90/61.  Temperature 97.8, respirations 20,  O2 saturation 98% on room air.  Orthostatic blood pressure lying 89/58 with  pulse 94, sitting76/50 with pulse 111 with symptoms, standing 57/43 with  pulse 46 and symptoms.  GENERAL:  Pleasant, alert, African-American female in no acute distress.  HEENT:  Within normal range.  LUNGS:  Clear to auscultation bilaterally without crackles.  CARDIOVASCULAR:  Regular rate and rhythm, 2/6 systolic ejection murmur.  ABDOMEN:  Soft, nontender, nondistended with normoactive bowel sounds.  No  hepatosplenomegaly.  RECTAL:  External hemorrhoids,  heme negative.  No masses.  EXTREMITIES:  No edema.  Left foot callus, one was large and scabbed over.  LYMPHADENOPATHY:  No cervical lymphadenopathy.  NEUROLOGIC:  Nonfocal.   LABORATORY DATA:  Electrocardiogram within normal limits.  Creatinine 8.7.  White count 4.8, hemoglobin 12.4.  LFTs within normal limits.  First set of  cardiac enzymes were normal.   EKG showed heart rate 85, normal sinus rhythm, left axis shift, no LVH.  Ther was T wave inversion at aVL but not significant ischemic changes.   HOSPITAL COURSE:  This 60 year old with diabetes presented with orthostatic  hypertension and was admitted for evaluation and treatment.  She was  admitted, received IV fluids, and her symptoms quickly abated.  She had no  findings to suggest any infectious or cardiac etiology.  TSH was normal at  2.231.  First set of cardiac enzymes were negative.  Drug screen was  negative.  In light of possibility there may be an adrenal component, a  morning cortisol was drawn; however, it turned out that this morning  cortisol was added to blood previously drawn at 12:30 in the afternoon.  The  result of  that was  7.0 which was essentially nondiagnostic.   Prior to discharge, the patient was feeling well without an additional  dizziness.  Blood pressure was 140 to 143/60 to 77.  She was eating well  without any other symptoms.   It was help the hypotension was likely secondary to volume depletion.  There  was no evidence for infection, MI, or clinical suspicion for adrenal  insufficiency.  However, will arrange for followup within a few days as  outpatient and either recheck an a.m. cortisol level or defer to primary  physician for consideration of a stimulation test.   With hypotension, will hold hydrochlorothiazide and follow up in  approximately one week with likely need to resume this medicine.       [name], M.D.                      [name], M.D.    CH/MEDQ  D:  06/18/2002  T:  06/19/2002  Job:  [id number]

## 2010-07-11 NOTE — Consult Note (Signed)
NAME:  [name], [name]                          ACCOUNT NO.:  [id number]   MEDICAL RECORD NO.:  [id number]                   PATIENT TYPE:  REC   LOCATION:  FOOT                                 FACILITY:  [facility]   PHYSICIAN:  Jonelle Sports. Cheryll Cockayne, M.D.              DATE OF BIRTH:  [birth year]   DATE OF CONSULTATION:  06/15/2002  DATE OF DISCHARGE:                                   CONSULTATION   REFERRING PHYSICIAN:  Dante Gang, M.D.   HISTORY OF PRESENT ILLNESS:  [name] is referred by  Dr. Alfonse Alpers,  for foot assessment.  She has longstanding type 2 diabetes with  modest control (recent hemoglobin A1C is 7.2%).  She has had insensate feet  for some time and has been prone to bilateral thick calluses in multiple  areas on her feet.  She has generally trimmed these herself and remarkably  has had no self-inflicted trauma of consequence, no infection and no  ulceration.   She has had some recent visual problems and all of these have been corrected  through bilateral cataract surgeries.  She feels that she is no longer able  to care for her feet and fortunately comes here for evaluation and advise.   PAST MEDICAL HISTORY:  This is notable for hypertension and chronic liver  function abnormalities as well as retinal, renal and peripheral nerve  complications of her diabetes.  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   ALLERGIES:  No known drug allergies.   MEDICATIONS:  Her regular medications include Glucophage, Glucotrol,  hydrochlorothiazide, Neurontin and ibuprofen.   PHYSICAL EXAMINATION:  EXTREMITIES:  Examination today is limited to the  distal lower extremities.  The feet are quite deformed, characterized by  bilateral hallux valgus, slightly worse on the right than on the left.  She  has clawing of toes two through five on the right and one through five on  the left.  Her skin temperatures are  essentially normal and symmetrical.  Pedal pulses are palpable and appear adequate.  Monofilament testing shows  complete lack of protective sensation throughout both feet.  There is  evidence of some chronic venostasis change in the lower extremities and she  has several hyperpigmented areas in the pretibial areas as well as a button-  like eschar on the lesion of the high lateral pretibial area on the right.  There are minimal corns on the dorsal aspect of the third toe on the left  and the fifth toe on the right.  On the plantar aspect of her feet, there  are multiple exophytic calluses, primarily at the first and fifth metatarsal  head areas on the right and the first and fourth metatarsal head areas on  the left.  On the plantar aspect of the left foot she has, as well, four  additional areas,  two on the plantar mid foot, one quite posteriorly at the  plantar aspect of the heel and the other one laterally at the first  metatarsal base area.  These are callused and appear to constitute plantar  warts.   ASSESSMENT AND PLAN:  1. The patient is given instructions regarding foot care and diabetes by     video with nurse and physician reinforcement.  2. Her footwear are evaluated and found to be locally inadequate in terms of     the degree of pressure relief they provide and also to be quite     inadequate in width.  3. The pretibial lesions are left undisturbed at this point.  4. The huge calluses as aforementioned on the plantar aspect of both feet     are sharply pared without significant incident.  The centers are quite     soft, particularly underlying the fifth metatarsal head on the right and     the first metatarsal head on the left.  5. The several plantar wart type areas on the mid and posterior aspects of     the plantar surface of the left foot are sharply paired and excavated and     15% salicylic acid and collodion is applied to these lesions.  6. The patient is advised  to use bag bomb on at least a daily basis and is     given a prescription for custom diabetic inserts as well as for shoes of     adequate lift and length.  She will hopefully obtain these in the     immediate future.  7. Follow-up visit is arranged at this clinic for three weeks.                                               Jonelle Sports. Cheryll Cockayne, M.D.    RES/MEDQ  D:  06/15/2002  T:  06/16/2002  Job:  [id number]   cc:   Dante Gang, M.D.  Int. Med - Resident - [facility]  [place]  [place] [address]  Fax: [phone]

## 2010-07-11 NOTE — H&P (Signed)
[place]  Patient:    [name], [name]                       MRN: [id number] Adm. Date:  [id number] Attending:  Selina Cooley                         History and Physical  DATE OF BIRTH:  1950-04-06  PROBLEM LIST: 1. Right foot ulcer complicated with cellulitis, rule out bacteremia. 2. Uncontrolled type 2 diabetes mellitus.    a. Retinopathy. 3. Dehydration. 4. Normocytic anemia, hemoglobin 14.4, MCV 95. 5. Smoking one pack per day x 3 years. 6. Abnormal LFTs, ?alcohol abuse. 7. History of left breast mastitis with abscess.    a. Status post I&D in 1999. 8. Poor social situation.  CHIEF COMPLAINT:  Right foot pain, frequent chills.  HISTORY OF PRESENT ILLNESS:  [name] is a very pleasant 60 year old female who presents with a 2-1/2 day history of right foot swelling, warmth, subjective fever, chills, some nausea, though no vomiting.  The patient describes fairly mild trauma to the right foot against a chair leg about three days ago.  The patient denies chest pain, shortness of breath, diarrhea, constipation, orthopnea, PND, melena, tarry stools, bright red blood per rectum, hematuria, night sweats, hemoptysis, focal weakness, syncope.  She describes anorexia and malaise for the last 24 hours.  She denies cough.  No post nasal drip.  She reports chronic sight problems, unable to focus on things or able to read due to blurred vision.  She describes polyuria and polydipsia.  She also describes nocturia.  PAST MEDICAL HISTORY:  As problem list.  ALLERGIES:  None.  MEDICATIONS:  None, though she was supposed to take NPH 20 units in the morning, 10 units at night.  FAMILY MEDICAL HISTORY:  Her sister has diabetes.  Otherwise no hypertension, strokes, malignancy, or early coronary artery disease in the family.  SOCIAL HISTORY:  The patient is single.  She has three children, ages 72, 66, and 6.  She is disabled.  She goes to  [employer].  She drinks occasional alcohol.  She describes about 12 ounces of beer twice a week.  She smokes one pack per day for three years.  REVIEW OF SYSTEMS:  As HPI.  PHYSICAL EXAMINATION:  VITAL SIGNS:  Temperature 97.6, blood pressure 139/80, heart rate 79, respirations 18, oxygen saturation 99% on room air.  HEENT:  Normocephalic, atraumatic, nonicteric sclerae, conjunctivae within normal limits.  PERRLA, EOMI.  Funduscopic exam positive for diabetic changes, no papilledema.  Very dry mucous membranes, TMs within normal limits. Oropharynx clear.  NECK:  Supple, no JVD, no bruits, no adenopathy, no thyromegaly.  There is a lipoma on the left side of the neck right below the ear.  The size of this lipoma is 1 x 1 cm in size.  Patient describes this lipoma for many years.  LUNGS:  Clear to auscultation bilaterally without crackles, wheezes.  Fair air movement bilaterally.  CARDIAC:  Regular rate and rhythm with a 2/6 systolic ejection murmur at the left lower sternal border.  No radiation to apex.  No S3.  No rubs.  ABDOMEN:  Flat, nontender, nondistended, bowel sounds were present.  No hepatosplenomegaly.  No rebound, no guarding, no masses.  No bruits.  GENITOURINARY:  Within normal limits.  BREASTS:  Within normal limits.  RECTAL:  Deferred.  EXTREMITIES:  The right  foot has a 2 x 1 cm ulcer at the base of the fifth MTP.  There is clear drainage.  Warmth is present through the foot and distal region of the tibial.  Also _________ from the foot out into the distal one third of the tibial region.  The pulses are 2+ bilaterally.  There is no _________ .  The wound is tender to touch.  No clubbing, no cyanosis.  There is trace edema at the right lower extremity.  The left extremity has multiple areas of callous formation, though no evidence of ulcers.  No clubbing, cyanosis, or edema in this extremity.  The pulses are 2+ bilaterally.  NEUROLOGIC:  Alert  and oriented x 3.  Cranial nerves 2-12 intact.  Strength 5/5 in all extremities.  DTRs 3/5 in all extremities.  Sensorium intact. Plantar reflexes downgoing bilaterally.  LABORATORY DATA:  Sodium 130, potassium 3.9, chloride 97, CO2 27, BUN 14, creatinine 0.6, glucose 419.  Albumin 3.4, SGOT 73, SGPT 81, alkaline phosphatase 97, total bilirubin 0.2.  Hemoglobin 11.4, MCV 95, wbcs pending. Platelets pending.  X-rays of the right foot negative for osteomyelitis.  No subcutaneous _________ .  There are inflammatory changes in the base of the right fifth MTP.  ASSESSMENT/PLAN: 1. Right foot ulcer, complicated with cellulitis, rule out bacteremia - the    patient presents with about a 2-1/2 day history of local signs of infection    at the right foot.  Apparently this infection started with a trauma to the    right lateral aspect of the foot about three days ago.  The patient    describes chronic callous formation and there is a possible chronic    diabetic ulcer at the same location.  The x-rays showed no evidence of    osteomyelitis, nor subcutaneous gas.  The arterial pulses seem to be within    normal limits.  Mild signs of septicemia could be present.  Lab cultures    have been obtained.  The patient has been admitted to a regular bed to    receive intravenous antibiotic therapy.  The most common microorganisms    associated with this type of infection are mixed flora, as given the    location of this ulcer and the fact that the patient is a diabetic.    Pseudomonas can be involved in about 13% of the cases.  For this reason,    we will use Zosyn for empiric antibiotic coverage.  We will elevate the    right leg.  A surgical consult will be obtained to consider debridement    of this ulcer. 2. Uncontrolled type 2 diabetes mellitus - The uncontrolled hyperglycemia    seems to be multifactorial.  The infectious process of the right foot as    well as the lack of insulin for many months  seems to be the most likely    etiologies.  The lab data shows no evidence of acidosis.  The patient     describes a clear sense of hyperglycemia for many weeks.  We will go    ahead and start the patient on Amaryl and Actose.  Sliding scale insulin    will be give before meals and at bedtime.  One Touch machine and a diabetic    referral and teaching will be started during this hospital stay.  An    ophthalmology referral also will be needed for further evaluation of her    possible diabetic retinopathy.  A hemoglobin  A1c will be started today.  A    urinalysis will be obtained to confirm the proteinuria that tends to be    associated with uncontrolled diabetes.  Retinopathy and nephropathy tend to    come along together in these cases.  Also we will go ahead and start the    patient in ARB for kidney protection. 3. Dehydration - The physical exam is consistent with dehydration.  Will start    the patient on IV hydration therapy.  The fluid balance will be monitored.    The decreased p.o. intake on hyperglycemia is the most likely reason for    this dehydration.  Also the possible alcohol abuse may have contributed    with these dehydration findings. 4. Normocytic anemia - Current the patient denies any symptoms of acute    bleeding.  Will go ahead and start the patient on a protein pump inhibitor.    Anemia status will be obtained today. 5. Abnormal LFTs - The patient denies any previous history of IV drug abuse,    or hepatitis.  Given the alcohol use, alcoholic hepatitis is possible    despite the lack of SGOT, SGPT split bottom.  Will provide her with    supportive care.  Another set of LFTs will be obtained tomorrow, pending    if exam is benign.  If the patients LFTs were to get worse and not    improve, hepatitis etiologies could be considered.  For now will use    Ativan p.r.n., multivitamins, and thiamine, given the alcohol use.  Will    be watching for alcohol withdrawal.  For  now we will hold on Librium until    further evaluation. 6. Smoking - We spent about 10 minutes talking about smoking cessation.  The    patient declined nicotine patch for now. DD:  01/17/00 TD:  01/17/00 Job: [id number]

## 2010-07-16 ENCOUNTER — Emergency Department (HOSPITAL_COMMUNITY)
Admission: EM | Admit: 2010-07-16 | Discharge: 2010-07-17 | Disposition: A | Discharge status: Home / Self Care | Payer: Medicaid Other | Attending: Emergency Medicine | Admitting: Emergency Medicine

## 2010-07-16 ENCOUNTER — Emergency Department (HOSPITAL_COMMUNITY): Payer: Medicaid Other

## 2010-07-16 DIAGNOSIS — J4489 Other specified chronic obstructive pulmonary disease: Secondary | ICD-10-CM | POA: Insufficient documentation

## 2010-07-16 DIAGNOSIS — E119 Type 2 diabetes mellitus without complications: Secondary | ICD-10-CM | POA: Insufficient documentation

## 2010-07-16 DIAGNOSIS — I10 Essential (primary) hypertension: Secondary | ICD-10-CM | POA: Insufficient documentation

## 2010-07-16 DIAGNOSIS — M549 Dorsalgia, unspecified: Secondary | ICD-10-CM | POA: Insufficient documentation

## 2010-07-16 DIAGNOSIS — Z79899 Other long term (current) drug therapy: Secondary | ICD-10-CM | POA: Insufficient documentation

## 2010-07-16 DIAGNOSIS — K219 Gastro-esophageal reflux disease without esophagitis: Secondary | ICD-10-CM | POA: Insufficient documentation

## 2010-07-16 DIAGNOSIS — K5900 Constipation, unspecified: Secondary | ICD-10-CM | POA: Insufficient documentation

## 2010-07-16 DIAGNOSIS — N289 Disorder of kidney and ureter, unspecified: Secondary | ICD-10-CM | POA: Insufficient documentation

## 2010-07-16 DIAGNOSIS — R109 Unspecified abdominal pain: Secondary | ICD-10-CM | POA: Insufficient documentation

## 2010-07-16 DIAGNOSIS — J449 Chronic obstructive pulmonary disease, unspecified: Secondary | ICD-10-CM | POA: Insufficient documentation

## 2010-07-16 LAB — CBC
HCT: 33.7 % — ABNORMAL LOW (ref 36.0–46.0)
Hemoglobin: 11.5 g/dL — ABNORMAL LOW (ref 12.0–15.0)
MCH: 30.1 pg (ref 26.0–34.0)
MCHC: 34.1 g/dL (ref 30.0–36.0)
MCV: 88.2 fL (ref 78.0–100.0)

## 2010-07-16 LAB — HEPATIC FUNCTION PANEL
AST: 47 U/L — ABNORMAL HIGH (ref 0–37)
Albumin: 3.2 g/dL — ABNORMAL LOW (ref 3.5–5.2)
Alkaline Phosphatase: 125 U/L — ABNORMAL HIGH (ref 39–117)
Total Bilirubin: 0.2 mg/dL — ABNORMAL LOW (ref 0.3–1.2)

## 2010-07-16 LAB — LIPASE, BLOOD: Lipase: 6 U/L — ABNORMAL LOW (ref 11–59)

## 2010-07-16 LAB — URINALYSIS, ROUTINE W REFLEX MICROSCOPIC
Bilirubin Urine: NEGATIVE
Leukocytes, UA: NEGATIVE
Nitrite: NEGATIVE
Specific Gravity, Urine: 1.016 (ref 1.005–1.030)
Urobilinogen, UA: 0.2 mg/dL (ref 0.0–1.0)

## 2010-07-16 LAB — URINE MICROSCOPIC-ADD ON

## 2010-07-16 LAB — DIFFERENTIAL
Lymphocytes Relative: 51 % — ABNORMAL HIGH (ref 12–46)
Monocytes Absolute: 0.2 10*3/uL (ref 0.1–1.0)
Monocytes Relative: 4 % (ref 3–12)
Neutro Abs: 1.7 10*3/uL (ref 1.7–7.7)

## 2010-07-16 LAB — BASIC METABOLIC PANEL
CO2: 23 mEq/L (ref 19–32)
Calcium: 9.2 mg/dL (ref 8.4–10.5)
Creatinine, Ser: 1.22 mg/dL — ABNORMAL HIGH (ref 0.4–1.2)
Glucose, Bld: 283 mg/dL — ABNORMAL HIGH (ref 70–99)

## 2010-07-25 ENCOUNTER — Other Ambulatory Visit: Payer: Self-pay | Admitting: *Deleted

## 2010-07-28 MED ORDER — NAPROXEN 250 MG PO TABS: 250.0000 mg | ORAL_TABLET | Freq: Two times a day (BID) | ORAL | Status: DC

## 2010-08-18 ENCOUNTER — Other Ambulatory Visit: Payer: Self-pay | Admitting: *Deleted

## 2010-08-18 MED ORDER — "INSULIN SYRINGE-NEEDLE U-100 31G X 5/16"" 0.5 ML MISC": Status: DC

## 2010-09-23 ENCOUNTER — Other Ambulatory Visit: Payer: Self-pay | Admitting: *Deleted

## 2010-09-23 MED ORDER — LIDOCAINE-HYDROCORTISONE ACE 3-0.5 % EX CREA: TOPICAL_CREAM | CUTANEOUS | Status: DC

## 2010-09-24 ENCOUNTER — Other Ambulatory Visit: Payer: Self-pay | Admitting: *Deleted

## 2010-09-24 MED ORDER — NAPROXEN 250 MG PO TABS: 250.0000 mg | ORAL_TABLET | Freq: Two times a day (BID) | ORAL | Status: DC

## 2010-09-24 NOTE — Telephone Encounter (Signed)
Pharmacy states that ins will not pay for lidocaine/hydrocortisone cream anymore, may they change to proctozone HC 2.5%? If so please add to med list and send electronically.  Thanks,h.

## 2010-09-25 MED ORDER — HYDROCORTISONE 2.5 % RE CREA: TOPICAL_CREAM | Freq: Two times a day (BID) | RECTAL | Status: AC

## 2010-10-08 ENCOUNTER — Encounter (INDEPENDENT_AMBULATORY_CARE_PROVIDER_SITE_OTHER): Payer: Medicaid Other

## 2010-10-08 DIAGNOSIS — L97509 Non-pressure chronic ulcer of other part of unspecified foot with unspecified severity: Secondary | ICD-10-CM

## 2010-10-30 ENCOUNTER — Other Ambulatory Visit: Payer: Self-pay | Admitting: Internal Medicine

## 2010-11-19 LAB — CULTURE, ROUTINE-ABSCESS

## 2010-12-25 ENCOUNTER — Other Ambulatory Visit: Payer: Self-pay | Admitting: Internal Medicine

## 2010-12-26 ENCOUNTER — Other Ambulatory Visit: Payer: Self-pay | Admitting: *Deleted

## 2010-12-26 MED ORDER — ACCU-CHEK SOFTCLIX LANCET DEV MISC: Status: AC

## 2010-12-26 MED ORDER — GLUCOSE BLOOD VI STRP: ORAL_STRIP | Status: AC

## 2010-12-26 MED ORDER — ACCU-CHEK AVIVA PLUS W/DEVICE KIT: 1.0000 | PACK | Freq: Three times a day (TID) | Status: DC

## 2010-12-26 NOTE — Telephone Encounter (Signed)
Need to include Dx code 250.01, times to test a day

## 2011-01-13 ENCOUNTER — Other Ambulatory Visit: Payer: Self-pay | Admitting: *Deleted

## 2011-01-13 MED ORDER — PANCRELIPASE (LIP-PROT-AMYL) 24000-76000 UNITS PO CPEP: 24000.0000 [IU] | ORAL_CAPSULE | Freq: Every day | ORAL | Status: DC

## 2011-01-14 ENCOUNTER — Other Ambulatory Visit: Payer: Self-pay | Admitting: *Deleted

## 2011-01-14 MED ORDER — POLYETHYLENE GLYCOL 3350 17 G PO PACK: 17.0000 g | PACK | Freq: Every day | ORAL | Status: DC | PRN

## 2011-01-14 NOTE — Telephone Encounter (Signed)
Done

## 2011-01-14 NOTE — Telephone Encounter (Signed)
Requesting rx for: Polyethylene Glycol - Mix 17gm with water 1-2 times daily for constipation - Qty #255 Thanks

## 2011-01-19 ENCOUNTER — Telehealth: Payer: Self-pay | Admitting: *Deleted

## 2011-01-19 MED ORDER — PANCRELIPASE (LIP-PROT-AMYL) 24000-76000 UNITS PO CPEP: ORAL_CAPSULE | ORAL | Status: DC

## 2011-01-19 NOTE — Telephone Encounter (Signed)
done

## 2011-01-19 NOTE — Telephone Encounter (Signed)
Pharmacy said that the pt's insurance will only pay for 30 caps since the prescription is written one capsule daily.  Old prescription was written 1-3 caps with meals and snacks.  Max of 10/day.  Please rewrite the prescription to reflect the 1-3 capsules daily so that 300 capsules can be dispensed.  Pt uses [facility]. Angelina Ok, RN 01/19/2011 10:30 AM.

## 2011-01-23 ENCOUNTER — Encounter: Payer: Self-pay | Admitting: Internal Medicine

## 2011-01-23 ENCOUNTER — Ambulatory Visit (INDEPENDENT_AMBULATORY_CARE_PROVIDER_SITE_OTHER): Payer: Medicaid Other | Admitting: Internal Medicine

## 2011-01-23 DIAGNOSIS — R232 Flushing: Secondary | ICD-10-CM | POA: Insufficient documentation

## 2011-01-23 DIAGNOSIS — I10 Essential (primary) hypertension: Secondary | ICD-10-CM

## 2011-01-23 DIAGNOSIS — Z23 Encounter for immunization: Secondary | ICD-10-CM

## 2011-01-23 DIAGNOSIS — D638 Anemia in other chronic diseases classified elsewhere: Secondary | ICD-10-CM

## 2011-01-23 DIAGNOSIS — N259 Disorder resulting from impaired renal tubular function, unspecified: Secondary | ICD-10-CM

## 2011-01-23 DIAGNOSIS — M1990 Unspecified osteoarthritis, unspecified site: Secondary | ICD-10-CM

## 2011-01-23 DIAGNOSIS — N951 Menopausal and female climacteric states: Secondary | ICD-10-CM

## 2011-01-23 DIAGNOSIS — E785 Hyperlipidemia, unspecified: Secondary | ICD-10-CM

## 2011-01-23 DIAGNOSIS — N289 Disorder of kidney and ureter, unspecified: Secondary | ICD-10-CM

## 2011-01-23 DIAGNOSIS — E119 Type 2 diabetes mellitus without complications: Secondary | ICD-10-CM

## 2011-01-23 LAB — COMPREHENSIVE METABOLIC PANEL
Alkaline Phosphatase: 103 U/L (ref 39–117)
Creat: 1.36 mg/dL — ABNORMAL HIGH (ref 0.50–1.10)
Glucose, Bld: 131 mg/dL — ABNORMAL HIGH (ref 70–99)
Sodium: 139 mEq/L (ref 135–145)
Total Bilirubin: 0.4 mg/dL (ref 0.3–1.2)
Total Protein: 7.2 g/dL (ref 6.0–8.3)

## 2011-01-23 LAB — LIPID PANEL
HDL: 79 mg/dL (ref >39)
LDL Cholesterol: 107 mg/dL — ABNORMAL HIGH (ref 0–99)
Total CHOL/HDL Ratio: 2.5 Ratio
Triglycerides: 76 mg/dL (ref <150)
VLDL: 15 mg/dL (ref 0–40)

## 2011-01-23 LAB — CBC
Hemoglobin: 11.5 g/dL — ABNORMAL LOW (ref 12.0–15.0)
MCH: 30.2 pg (ref 26.0–34.0)
MCHC: 32.6 g/dL (ref 30.0–36.0)
MCV: 92.7 fL (ref 78.0–100.0)

## 2011-01-23 LAB — POCT GLYCOSYLATED HEMOGLOBIN (HGB A1C): Hemoglobin A1C: 6.9

## 2011-01-23 MED ORDER — GABAPENTIN 100 MG PO CAPS: ORAL_CAPSULE | ORAL | Status: DC

## 2011-01-23 MED ORDER — LISINOPRIL 10 MG PO TABS: 10.0000 mg | ORAL_TABLET | Freq: Every day | ORAL | Status: DC

## 2011-01-23 MED ORDER — HYDROCORTISONE 2.5 % RE CREA: TOPICAL_CREAM | Freq: Two times a day (BID) | RECTAL | Status: AC

## 2011-01-23 MED ORDER — PANCRELIPASE (LIP-PROT-AMYL) 24000-76000 UNITS PO CPEP: ORAL_CAPSULE | ORAL | Status: DC

## 2011-01-23 MED ORDER — POLYETHYLENE GLYCOL 3350 17 G PO PACK: 17.0000 g | PACK | Freq: Every day | ORAL | Status: DC | PRN

## 2011-01-23 NOTE — Patient Instructions (Signed)
Follow up In February 2013. I've prescribed you a new medicine  Neurontin for pain. Stopped taking Naprosyn. Bring all your medicines at next visit.

## 2011-01-23 NOTE — Progress Notes (Signed)
60 year old woman with diabetes, hypertension, chronic kidney disease, arthritis of the back, knees and hips comes to the clinic complaining of pain Pain has gotten worse since past 2-3 months. Her left shoulder, left hip and knee hurts the most. She uses Naprosyn which does not help her at all. She does not use Tylenol. She has tried Percocet from her brother which did help her  and she requests Percocet prescription  She also complains of hot flashes been going on for almost a year. She also complains of vertigo. She complains of feeling off spinning during the day and at night. It's there in sitting or lying or standing position. Sometimes associated with headaches. No nausea or vomiting. Mostly associated with her hot flashes episodes. No falls.  Physical exam BP 137/68  Pulse 66  Temp(Src) 97.7 F (36.5 C) (Oral)  Ht 5\' 5"  (1.651 m)  Wt 186 lb 8 oz (84.596 kg)  BMI 31.04 kg/m2  General Appearance:    anxious, Alert, cooperative, no distress, appears stated age  Back:     Symmetric, no curvature, ROM normal, no CVA tenderness  Lungs:     Clear to auscultation bilaterally, respirations unlabored  Chest wall:    No tenderness or deformity  Heart:    tachycardia, Regular rate and rhythm, S1 and S2 normal, no murmur, rub   or gallop  Abdomen:     Soft, non-tender, bowel sounds active all four quadrants,    no masses, no organomegaly  Extremities:   Extremities normal, atraumatic, no cyanosis or edema  Pulses:   2+ and symmetric all extremities  Skin:   Skin color, texture, turgor normal, no rashes or lesions  Lymph nodes:   Cervical, supraclavicular, and axillary nodes normal  Neurologic:   CNII-XII intact. Normal strength, sensation and reflexes      throughout   Review of system: As per history of present illness

## 2011-01-23 NOTE — Assessment & Plan Note (Addendum)
Hopefully the Neurontin prescribed for pain will help her with that. Can also try SSRI or SNRI and she probably has some anxiety disorder as well. I will consider estrogen replacement therapy in the future if this does not help

## 2011-01-23 NOTE — Assessment & Plan Note (Signed)
Check kidney function today. 

## 2011-01-23 NOTE — Assessment & Plan Note (Signed)
Changed Norvasc to lisinopril because of diabetes.

## 2011-01-23 NOTE — Assessment & Plan Note (Signed)
Patient has been on Naprosyn before. I do not feel comfortable continuing this given she may be taking more than she is prescribed because of the pain and she has chronic kidney disease. She does not take Tylenol because she was told by someone that she should not but I do not see any contraindication at this time. She has not tried any other medicines as before. I would start by trying Neurontin which may help with her hot flashes as well. The dose of Neurontin can be increased at next visit if she does not have pain relief from this dose. We'll ask her to take Tylenol as needed. Will also refer her to physical therapy next visit if she's not happy with the pain management at that time.

## 2011-01-23 NOTE — Assessment & Plan Note (Addendum)
Controlled. Continue Lantus. Check creatinine and urine microalbumin creatinine ratio. Change her Norvasc to lisinopril. I asked her to follow up with her eye doctor for diabetic eye exam. Foot exam normal.

## 2011-01-23 NOTE — Assessment & Plan Note (Signed)
Check ferritin as I do not see any on the chart.

## 2011-01-27 ENCOUNTER — Encounter: Payer: Medicaid Other | Admitting: Internal Medicine

## 2011-03-02 ENCOUNTER — Other Ambulatory Visit: Payer: Self-pay | Admitting: Internal Medicine

## 2011-03-25 ENCOUNTER — Telehealth: Payer: Self-pay | Admitting: Dietician

## 2011-03-25 NOTE — Telephone Encounter (Signed)
Says she has an appointment at [facility] next week 03/31/11. She will ask them to send [place] a report.

## 2011-04-01 DIAGNOSIS — E785 Hyperlipidemia, unspecified: Secondary | ICD-10-CM | POA: Insufficient documentation

## 2011-04-01 HISTORY — DX: Hyperlipidemia, unspecified: E78.5

## 2011-04-01 NOTE — Assessment & Plan Note (Signed)
LDL goal less than 100, higher this time. Was never high before. Advised lifestyle changes, may need statin if still high when checked again at next visit.

## 2011-04-17 ENCOUNTER — Encounter: Payer: Medicaid Other | Admitting: Internal Medicine

## 2011-04-23 ENCOUNTER — Encounter: Payer: Medicaid Other | Admitting: Internal Medicine

## 2011-05-25 ENCOUNTER — Encounter: Payer: Medicaid Other | Admitting: Internal Medicine

## 2011-06-23 ENCOUNTER — Telehealth: Payer: Self-pay | Admitting: *Deleted

## 2011-06-23 ENCOUNTER — Encounter: Payer: Medicaid Other | Admitting: Internal Medicine

## 2011-06-23 MED ORDER — ALBUTEROL SULFATE HFA 108 (90 BASE) MCG/ACT IN AERS: 2.0000 | INHALATION_SPRAY | Freq: Four times a day (QID) | RESPIRATORY_TRACT | Status: DC | PRN

## 2011-06-23 NOTE — Telephone Encounter (Signed)
Albuterol rx called to [facility].

## 2011-06-23 NOTE — Telephone Encounter (Signed)
Changed medications, Rx sent to [facility] pharmacy

## 2011-06-23 NOTE — Telephone Encounter (Signed)
Call for Prior Authorization on Ventalin Inhaler.  Pt will need to try [employer] or Proventil inhales first.  Angelina Ok, RN 06/23/2011 4:41 PM

## 2011-08-04 ENCOUNTER — Ambulatory Visit (INDEPENDENT_AMBULATORY_CARE_PROVIDER_SITE_OTHER): Payer: Medicaid Other | Admitting: Internal Medicine

## 2011-08-04 ENCOUNTER — Encounter: Payer: Self-pay | Admitting: Internal Medicine

## 2011-08-04 VITALS — BP 154/67 | HR 66 | Temp 97.7°F | Ht 65.0 in | Wt 182.6 lb

## 2011-08-04 DIAGNOSIS — E119 Type 2 diabetes mellitus without complications: Secondary | ICD-10-CM

## 2011-08-04 DIAGNOSIS — I10 Essential (primary) hypertension: Secondary | ICD-10-CM

## 2011-08-04 DIAGNOSIS — Z79899 Other long term (current) drug therapy: Secondary | ICD-10-CM

## 2011-08-04 DIAGNOSIS — M1990 Unspecified osteoarthritis, unspecified site: Secondary | ICD-10-CM

## 2011-08-04 DIAGNOSIS — I129 Hypertensive chronic kidney disease with stage 1 through stage 4 chronic kidney disease, or unspecified chronic kidney disease: Secondary | ICD-10-CM

## 2011-08-04 DIAGNOSIS — M810 Age-related osteoporosis without current pathological fracture: Secondary | ICD-10-CM

## 2011-08-04 DIAGNOSIS — L819 Disorder of pigmentation, unspecified: Secondary | ICD-10-CM

## 2011-08-04 DIAGNOSIS — R232 Flushing: Secondary | ICD-10-CM

## 2011-08-04 DIAGNOSIS — N189 Chronic kidney disease, unspecified: Secondary | ICD-10-CM

## 2011-08-04 DIAGNOSIS — N259 Disorder resulting from impaired renal tubular function, unspecified: Secondary | ICD-10-CM

## 2011-08-04 DIAGNOSIS — D638 Anemia in other chronic diseases classified elsewhere: Secondary | ICD-10-CM

## 2011-08-04 LAB — GLUCOSE, CAPILLARY: Glucose-Capillary: 161 mg/dL — ABNORMAL HIGH (ref 70–99)

## 2011-08-04 MED ORDER — PREGABALIN 50 MG PO CAPS: 50.0000 mg | ORAL_CAPSULE | Freq: Three times a day (TID) | ORAL | Status: DC

## 2011-08-04 MED ORDER — CALCIUM-VITAMIN D 500-200 MG-UNIT PO TABS: 1.0000 | ORAL_TABLET | Freq: Two times a day (BID) | ORAL | Status: DC

## 2011-08-04 MED ORDER — LISINOPRIL 40 MG PO TABS: 40.0000 mg | ORAL_TABLET | Freq: Every day | ORAL | Status: DC

## 2011-08-04 MED ORDER — DICLOFENAC SODIUM 1 % TD GEL: 1.0000 "application " | Freq: Four times a day (QID) | TRANSDERMAL | Status: DC

## 2011-08-04 NOTE — Assessment & Plan Note (Signed)
With ongoing use of NSIADS, uncontrolled HTN, prteinurea - may have got worse Recheck again today

## 2011-08-04 NOTE — Assessment & Plan Note (Signed)
Has not been on ca+vit D for a while Will restart it today May need another DEXA , but will pospone now

## 2011-08-04 NOTE — Assessment & Plan Note (Signed)
Fluctuant, mostly because of pain and medication non compliance Will increase lisinopril to 40 mg qd Check chem today and at next visit

## 2011-08-04 NOTE — Assessment & Plan Note (Signed)
No improvement with neurontin Switch neurontin because of side effects, start lytrica Also add voltaren gel and tyelnol for breakthough pain Physical therapy as well Follow up in 2 months, dose of lyrica can be changed at that time Her worst pain is low back without red flags signs or symptoms. MAy consider steroid injection in lumbar back in future[ Discussed with the patient about chronicity of this problem and expectations she should have with the medications she is trying Also, discussed with her that narcotics are not the best options given this pain is long term. Spent >40 min discussing these issues with the patient face to face

## 2011-08-04 NOTE — Assessment & Plan Note (Signed)
With ongoing NSAIDS use, proteinuria, uncontrolled HTN- may have got worse Recheck today

## 2011-08-04 NOTE — Patient Instructions (Addendum)
Use tylenol and voltaren gel for break through pain. Lyrica and physical therapy will help you in long term but you will still continue to have pain for 1-2 months after starting these treatments.  Start taking calcium and vitamin D for osteoporosis Get the dark spor on your sole checked by a skin doctor

## 2011-08-04 NOTE — Progress Notes (Signed)
Patient ID: [name], female   DOB: 03-28-1950, 61 y.o.   MRN: [id number] 61 year old woman with diabetes, hypertension, chronic kidney disease, arthritis of the back, knees and hips comes to the clinic complaining of pain Pain not improved since starting low dose neurontin last visit, she also felt drowsy and lost while taking it. She took it for 5 months, then stopped taking it as it was not helping.  Has been taking NSAIDS, volteren gel from her neighbor. Voltaren gel helped her somewhat  Physical exam BP 154/67  Pulse 66  Temp(Src) 97.7 F (36.5 C) (Oral)  Ht 5\' 5"  (1.651 m)  Wt 182 lb 9.6 oz (82.827 kg)  BMI 30.39 kg/m2  SpO2 99%  General Appearance:    anxious, Alert, cooperative, no distress, appears stated age  Back:     Symmetric, no curvature, ROM normal, no CVA tenderness  Lungs:     Clear to auscultation bilaterally, respirations unlabored  Chest wall:    No tenderness or deformity  Heart:    tachycardia, Regular rate and rhythm, S1 and S2 normal, no murmur, rub   or gallop  Abdomen:     Soft, non-tender, bowel sounds active all four quadrants,    no masses, no organomegaly  Extremities:   Extremities normal, atraumatic, no cyanosis or edema. Hyperpigmented spot on the sole of left foot, has been present for a long time, no significant change in size/appearance. Would like to get it checked by a derm  Pulses:   2+ and symmetric all extremities  Skin:   Skin color, texture, turgor normal, no rashes or lesions  Lymph nodes:   Cervical, supraclavicular, and axillary nodes normal  Neurologic:   CNII-XII intact. Normal strength, sensation and reflexes      throughout   Review of system: As per history of present illness

## 2011-08-05 LAB — BASIC METABOLIC PANEL
BUN: 43 mg/dL — ABNORMAL HIGH (ref 6–23)
Chloride: 112 mEq/L (ref 96–112)
Glucose, Bld: 140 mg/dL — ABNORMAL HIGH (ref 70–99)
Potassium: 5.1 mEq/L (ref 3.5–5.3)

## 2011-08-17 ENCOUNTER — Ambulatory Visit: Payer: Medicaid Other | Admitting: Physical Therapy

## 2011-09-01 ENCOUNTER — Ambulatory Visit: Payer: Medicaid Other | Attending: Internal Medicine | Admitting: Physical Therapy

## 2011-09-01 NOTE — Addendum Note (Signed)
Addended by: Neomia Dear on: 09/01/2011 06:33 PM   Modules accepted: Orders

## 2011-09-15 ENCOUNTER — Other Ambulatory Visit: Payer: Self-pay | Admitting: *Deleted

## 2011-09-15 DIAGNOSIS — K861 Other chronic pancreatitis: Secondary | ICD-10-CM

## 2011-09-15 MED ORDER — PANCRELIPASE (LIP-PROT-AMYL) 24000-76000 UNITS PO CPEP: ORAL_CAPSULE | ORAL | Status: DC

## 2011-09-23 NOTE — Progress Notes (Signed)
Called DR. LUPTON's OFFICE.  Patient did not show up and did not resch her dermatology appointment on 09/14/2011 per Chilon Boone/Willman Cuny, RN, 09/23/2011, 3:35P

## 2011-09-23 NOTE — Addendum Note (Signed)
Addended by: Neomia Dear on: 09/23/2011 03:46 PM   Modules accepted: Orders

## 2011-09-24 ENCOUNTER — Other Ambulatory Visit: Payer: Self-pay | Admitting: *Deleted

## 2011-09-24 DIAGNOSIS — M1990 Unspecified osteoarthritis, unspecified site: Secondary | ICD-10-CM

## 2011-09-24 DIAGNOSIS — D638 Anemia in other chronic diseases classified elsewhere: Secondary | ICD-10-CM

## 2011-09-24 DIAGNOSIS — R232 Flushing: Secondary | ICD-10-CM

## 2011-09-24 DIAGNOSIS — E119 Type 2 diabetes mellitus without complications: Secondary | ICD-10-CM

## 2011-09-24 DIAGNOSIS — I10 Essential (primary) hypertension: Secondary | ICD-10-CM

## 2011-09-24 DIAGNOSIS — N259 Disorder resulting from impaired renal tubular function, unspecified: Secondary | ICD-10-CM

## 2011-09-24 MED ORDER — POLYETHYLENE GLYCOL 3350 17 G PO PACK: 17.0000 g | PACK | Freq: Every day | ORAL | Status: DC | PRN

## 2011-10-06 ENCOUNTER — Encounter: Payer: Self-pay | Admitting: Internal Medicine

## 2011-10-09 ENCOUNTER — Encounter (INDEPENDENT_AMBULATORY_CARE_PROVIDER_SITE_OTHER): Payer: Medicaid Other

## 2011-10-09 DIAGNOSIS — I739 Peripheral vascular disease, unspecified: Secondary | ICD-10-CM

## 2011-10-09 DIAGNOSIS — R0989 Other specified symptoms and signs involving the circulatory and respiratory systems: Secondary | ICD-10-CM

## 2011-10-13 ENCOUNTER — Encounter: Payer: Medicaid Other | Admitting: Internal Medicine

## 2011-10-13 ENCOUNTER — Other Ambulatory Visit: Payer: Self-pay

## 2011-10-13 DIAGNOSIS — I739 Peripheral vascular disease, unspecified: Secondary | ICD-10-CM

## 2011-11-05 ENCOUNTER — Encounter: Payer: Medicaid Other | Admitting: Internal Medicine

## 2011-11-18 ENCOUNTER — Encounter (HOSPITAL_COMMUNITY): Payer: Self-pay | Admitting: Emergency Medicine

## 2011-11-18 ENCOUNTER — Emergency Department (HOSPITAL_COMMUNITY)
Admission: EM | Admit: 2011-11-18 | Discharge: 2011-11-18 | Disposition: A | Discharge status: Home / Self Care | Payer: Medicaid Other | Attending: Emergency Medicine | Admitting: Emergency Medicine

## 2011-11-18 ENCOUNTER — Emergency Department (HOSPITAL_COMMUNITY): Payer: Medicaid Other

## 2011-11-18 DIAGNOSIS — I959 Hypotension, unspecified: Secondary | ICD-10-CM

## 2011-11-18 DIAGNOSIS — F17200 Nicotine dependence, unspecified, uncomplicated: Secondary | ICD-10-CM | POA: Insufficient documentation

## 2011-11-18 DIAGNOSIS — E119 Type 2 diabetes mellitus without complications: Secondary | ICD-10-CM | POA: Insufficient documentation

## 2011-11-18 DIAGNOSIS — Z794 Long term (current) use of insulin: Secondary | ICD-10-CM | POA: Insufficient documentation

## 2011-11-18 DIAGNOSIS — N390 Urinary tract infection, site not specified: Secondary | ICD-10-CM | POA: Insufficient documentation

## 2011-11-18 DIAGNOSIS — R059 Cough, unspecified: Secondary | ICD-10-CM | POA: Insufficient documentation

## 2011-11-18 DIAGNOSIS — I10 Essential (primary) hypertension: Secondary | ICD-10-CM | POA: Insufficient documentation

## 2011-11-18 DIAGNOSIS — R05 Cough: Secondary | ICD-10-CM

## 2011-11-18 DIAGNOSIS — Z79899 Other long term (current) drug therapy: Secondary | ICD-10-CM | POA: Insufficient documentation

## 2011-11-18 HISTORY — DX: Essential (primary) hypertension: I10

## 2011-11-18 LAB — COMPREHENSIVE METABOLIC PANEL
AST: 18 U/L (ref 0–37)
Albumin: 3.4 g/dL — ABNORMAL LOW (ref 3.5–5.2)
Calcium: 9.4 mg/dL (ref 8.4–10.5)
Chloride: 106 mEq/L (ref 96–112)
Creatinine, Ser: 1.42 mg/dL — ABNORMAL HIGH (ref 0.50–1.10)
Total Protein: 7.1 g/dL (ref 6.0–8.3)

## 2011-11-18 LAB — CBC WITH DIFFERENTIAL/PLATELET
Basophils Absolute: 0 10*3/uL (ref 0.0–0.1)
Basophils Relative: 0 % (ref 0–1)
Eosinophils Absolute: 0.2 10*3/uL (ref 0.0–0.7)
Eosinophils Relative: 3 % (ref 0–5)
HCT: 34.1 % — ABNORMAL LOW (ref 36.0–46.0)
MCHC: 33.4 g/dL (ref 30.0–36.0)
Monocytes Absolute: 0.4 10*3/uL (ref 0.1–1.0)
Neutro Abs: 3.6 10*3/uL (ref 1.7–7.7)
RDW: 13.9 % (ref 11.5–15.5)

## 2011-11-18 LAB — URINALYSIS, ROUTINE W REFLEX MICROSCOPIC
Glucose, UA: NEGATIVE mg/dL
Nitrite: NEGATIVE
Specific Gravity, Urine: 1.016 (ref 1.005–1.030)
pH: 5 (ref 5.0–8.0)

## 2011-11-18 LAB — URINE MICROSCOPIC-ADD ON

## 2011-11-18 MED ORDER — LEVOFLOXACIN 500 MG PO TABS: 500.0000 mg | ORAL_TABLET | Freq: Every day | ORAL | Status: DC

## 2011-11-18 MED ORDER — SODIUM CHLORIDE 0.9 % IV BOLUS (SEPSIS)
1000.0000 mL | Freq: Once | INTRAVENOUS | Status: AC
  Administered 2011-11-18: 1000 mL via INTRAVENOUS

## 2011-11-18 MED ORDER — LEVOFLOXACIN IN D5W 750 MG/150ML IV SOLN
750.0000 mg | Freq: Once | INTRAVENOUS | Status: AC
  Administered 2011-11-18: 750 mg via INTRAVENOUS
  Filled 2011-11-18: qty 150

## 2011-11-18 MED ORDER — ALBUTEROL SULFATE (5 MG/ML) 0.5% IN NEBU
5.0000 mg | INHALATION_SOLUTION | Freq: Once | RESPIRATORY_TRACT | Status: AC
  Administered 2011-11-18: 5 mg via RESPIRATORY_TRACT
  Filled 2011-11-18: qty 40

## 2011-11-18 NOTE — ED Notes (Signed)
MD at bedside. 

## 2011-11-18 NOTE — ED Notes (Signed)
Cough and congestion since yesterday.

## 2011-11-18 NOTE — ED Provider Notes (Signed)
History     CSN: [id number]  Arrival date & time 11/18/11  1351   First MD Initiated Contact with Patient 11/18/11 1535      Chief Complaint  Patient presents with  . Cough    (Consider location/radiation/quality/duration/timing/severity/associated sxs/prior treatment) The history is provided by the patient.  [name] is a 61 y.o. female hx of DM, HTN here with cough. Cough and congestion since yesterday, coughing up clear sputum. No fevers, no CP or SOB.  No abdominal pain or vomiting. No hx of CHF or COPD but is a current smoker.    Past Medical History  Diagnosis Date  . Diabetes mellitus   . Hypertension     No past surgical history on file.  No family history on file.  History  Substance Use Topics  . Smoking status: Current Some Day Smoker -- 0.5 packs/day    Types: Cigarettes  . Smokeless tobacco: Not on file   Comment: read to quit  . Alcohol Use: Not on file    OB History    Grav Para Term Preterm Abortions TAB SAB Ect Mult Living                  Review of Systems  Respiratory: Positive for cough.   All other systems reviewed and are negative.    Allergies  Review of patient's allergies indicates no known allergies.  Home Medications   Current Outpatient Rx  Name Route Sig Dispense Refill  . ALBUTEROL SULFATE HFA 108 (90 BASE) MCG/ACT IN AERS Inhalation Inhale 2 puffs into the lungs every 6 (six) hours as needed for wheezing. 6.7 g 1  . ACCU-CHEK AVIVA PLUS W/DEVICE KIT Does not apply 1 Device by Does not apply route 3 (three) times daily. 1 kit 1  . CALCIUM-VITAMIN D 500-200 MG-UNIT PO TABS Oral Take 1 tablet by mouth daily with breakfast.    . DICLOFENAC SODIUM 1 % TD GEL Topical Apply 1 application topically 4 (four) times daily. 100 g 2  . GLUCOSE BLOOD VI STRP  Use as instructed 100 each 12  . INSULIN GLARGINE 100 UNIT/ML [place] SOLN Subcutaneous Inject 25 Units into the skin daily.    . INSULIN SYRINGE-NEEDLE U-100 31G X 5/16" 0.5 ML  MISC  Use to inject insulin daily. 100 each 0  . ACCU-CHEK SOFTCLIX LANCET DEV MISC  Use as instructed 100 each 11  . LISINOPRIL 40 MG PO TABS Oral Take 1 tablet (40 mg total) by mouth daily. 90 tablet 1  . PANCRELIPASE (LIP-PROT-AMYL) 24000 UNITS PO CPEP  Take 1-3 capsules with meals and snacks, maximum of 10 caps/day 300 capsule 3  . POLYETHYLENE GLYCOL 3350 PO PACK Oral Take 17 g by mouth daily as needed. For constipation 14 each 5  . PREGABALIN 50 MG PO CAPS Oral Take 1 capsule (50 mg total) by mouth 3 (three) times daily. 90 capsule 1  . TRAVOPROST 0.004 % OP SOLN Both Eyes Place 1 drop into both eyes at bedtime.      BP 117/42  Pulse 84  Temp 98.6 F (37 C) (Oral)  Resp 28  SpO2 97%  Physical Exam  Nursing note and vitals reviewed. Constitutional: She is oriented to person, place, and time. She appears well-developed.       Uncomfortable, coughing   HENT:  Head: Normocephalic.  Mouth/Throat: Oropharynx is clear and moist.  Eyes: Conjunctivae normal are normal. Pupils are equal, round, and reactive to light.  Neck: Normal range  of motion. Neck supple.  Cardiovascular: Normal rate and regular rhythm.        + systolic murmur (per patient, chronic)  Pulmonary/Chest: Effort normal and breath sounds normal.       Decreased air movement, no wheezing or crackles  Abdominal: Soft. Bowel sounds are normal.  Musculoskeletal: Normal range of motion. She exhibits no edema and no tenderness.  Neurological: She is alert and oriented to person, place, and time.  Skin: Skin is warm and dry.  Psychiatric: She has a normal mood and affect. Her behavior is normal. Judgment and thought content normal.    ED Course  Procedures (including critical care time)  Labs Reviewed  CBC WITH DIFFERENTIAL - Abnormal; Notable for the following:    RBC 3.74 (*)     Hemoglobin 11.4 (*)     HCT 34.1 (*)     Platelets 142 (*)     All other components within normal limits  COMPREHENSIVE METABOLIC PANEL  - Abnormal; Notable for the following:    Glucose, Bld 146 (*)     BUN 39 (*)     Creatinine, Ser 1.42 (*)     Albumin 3.4 (*)     GFR calc non Af Amer 40 (*)     GFR calc Af Amer 46 (*)     All other components within normal limits  GLUCOSE, CAPILLARY - Abnormal; Notable for the following:    Glucose-Capillary 141 (*)     All other components within normal limits  URINALYSIS, ROUTINE W REFLEX MICROSCOPIC - Abnormal; Notable for the following:    APPearance HAZY (*)     Protein, ur 30 (*)     Leukocytes, UA SMALL (*)     All other components within normal limits  URINE MICROSCOPIC-ADD ON - Abnormal; Notable for the following:    Squamous Epithelial / LPF MANY (*)     Bacteria, UA FEW (*)     Casts HYALINE CASTS (*)     All other components within normal limits   Dg Chest 2 View  11/18/2011  *RADIOLOGY REPORT*  Clinical Data: Cough, hypertension.  CHEST - 2 VIEW  Comparison: 07/28/2009  Findings: Heart and mediastinal contours are within normal limits. No focal opacities or effusions.  No acute bony abnormality.  IMPRESSION: No active cardiopulmonary disease.   Original Report Authenticated By: Cyndie Chime, M.D.      No diagnosis found.    MDM  [name] is a 61 y.o. female here with cough. Will consider viral syndrome vs pneumonia. Labs showed nl WBC, CMP unremarkable. CXR nl. But patient is hypotensive initially, will get UA and IVF and reassess.   8:09 PM Patient no longer hypotensive after fluids. UA + UTI. She was given levaquin and will be d/c home. Return instructions given.        Kiara Canal, MD 11/18/11 2010

## 2011-12-02 ENCOUNTER — Ambulatory Visit (INDEPENDENT_AMBULATORY_CARE_PROVIDER_SITE_OTHER): Payer: Medicaid Other | Admitting: Internal Medicine

## 2011-12-02 ENCOUNTER — Encounter: Payer: Self-pay | Admitting: Internal Medicine

## 2011-12-02 VITALS — BP 124/61 | HR 78 | Temp 97.6°F | Wt 180.3 lb

## 2011-12-02 DIAGNOSIS — R05 Cough: Secondary | ICD-10-CM | POA: Insufficient documentation

## 2011-12-02 DIAGNOSIS — J069 Acute upper respiratory infection, unspecified: Secondary | ICD-10-CM

## 2011-12-02 DIAGNOSIS — Z79899 Other long term (current) drug therapy: Secondary | ICD-10-CM

## 2011-12-02 DIAGNOSIS — E119 Type 2 diabetes mellitus without complications: Secondary | ICD-10-CM

## 2011-12-02 DIAGNOSIS — K861 Other chronic pancreatitis: Secondary | ICD-10-CM

## 2011-12-02 DIAGNOSIS — K649 Unspecified hemorrhoids: Secondary | ICD-10-CM

## 2011-12-02 MED ORDER — PANCRELIPASE (LIP-PROT-AMYL) 24000-76000 UNITS PO CPEP: ORAL_CAPSULE | ORAL | Status: DC

## 2011-12-02 MED ORDER — LIDOCAINE-HYDROCORTISONE ACE 3-0.5 % EX CREA: 1.0000 "application " | TOPICAL_CREAM | Freq: Two times a day (BID) | CUTANEOUS | Status: DC | PRN

## 2011-12-02 MED ORDER — HYDROCOD POLST-CHLORPHEN POLST 10-8 MG/5ML PO LQCR: 5.0000 mL | Freq: Two times a day (BID) | ORAL | Status: DC | PRN

## 2011-12-02 NOTE — Patient Instructions (Signed)
-  Please start taking Tussionex every 12 hours as needed for cough  -Continue lots of fluids!  -If you aren't feeling better in about a week, call the clinic so I can send in antibiotics.  Please be sure to bring all of your medications with you to every visit.  Should you have any new or worsening symptoms, please be sure to call the clinic at [phone].  Upper Respiratory Infection, Adult An upper respiratory infection (URI) is also known as the common cold. It is often caused by a type of germ (virus). Colds are easily spread (contagious). You can pass it to others by kissing, coughing, sneezing, or drinking out of the same glass. Usually, you get better in 1 or 2 weeks.  HOME CARE   Only take medicine as told by your doctor.  Use a warm mist humidifier or breathe in steam from a hot shower.  Drink enough water and fluids to keep your pee (urine) clear or pale yellow.  Get plenty of rest.  Return to work when your temperature is back to normal or as told by your doctor. You may use a face mask and wash your hands to stop your cold from spreading. GET HELP RIGHT AWAY IF:   After the first few days, you feel you are getting worse.  You have questions about your medicine.  You have chills, shortness of breath, or brown or red spit (mucus).  You have yellow or brown snot (nasal discharge) or pain in the face, especially when you bend forward.  You have a fever, puffy (swollen) neck, pain when you swallow, or white spots in the back of your throat.  You have a bad headache, ear pain, sinus pain, or chest pain.  You have a high-pitched whistling sound when you breathe in and out (wheezing).  You have a lasting cough or cough up blood.  You have sore muscles or a stiff neck. MAKE SURE YOU:   Understand these instructions.  Will watch your condition.  Will get help right away if you are not doing well or get worse. Document Released: 07/29/2007 Document Revised: 05/04/2011  Document Reviewed: 06/16/2010 [facility] Patient Information 2013 [place], [place].

## 2011-12-02 NOTE — Assessment & Plan Note (Addendum)
WBC on 11/18/11 when she was seen in ED was 5.3.  No fever.  No significant physical exam findings such as sinus tenderness, pharyngeal exudate, lymphadenopathy or wheezing.  Will manage symptoms with tussionex, so patient can get appropriate rest.  Continue fluid intake.  If no improvement in 1 week, patient should call clinic so we can prescribe azithromax for atypical PNA.  ADDENDUM: As of 12/08/11, patient reported she could not afford tussionex, and feels no better. Will treat with azithromax.

## 2011-12-02 NOTE — Progress Notes (Signed)
Subjective:   Patient ID: [name] female   DOB: September 12, 1950 61 y.o.   MRN: [id number]  HPI: [name] is a 61 y.o. woman with diabetes (lantus 25u qHS, A1c in Sept = 7, LDL 107 12/2010, not on statin), hypertension (lisinopril 40), chronic kidney disease (GFR 45-50, Cr 1.4, 11/18/11) , chronic pancreatitis & arthritis presents to clinic today with complaints of cough/cold.  Most recently on 11/18/11, she was seen in the ED for a URI and found to have a UTI and was treated with levaquin. In ED was given breathing treatment.  Completed 7d course of levaquin.  Regarding cold/cough symptoms, reports feeling ill with runny nose/cough for 3 weeks.  Has tried OTC diabetic robitussin.  Feels like symptoms are worsening.  No fever, but reports hot flashes.  Coughing all night.  Yellow sputum production, same as nasal discharge.  Sometimes feels nauseated, but no diarrhea/vomiting.  Initially sore throat, that has since resolved.  No sick contacts.  No wheezing, has an inhaler, but hasn't increased frequency.  Uses inhaler rarely - last use was prior to illness.  No shortness of breath except with exertion. Appetite good with lots of fluids.  Decreased smoking - 1-2 cig/day. +sneezing  Also complains of worsening hemorrhoids and requests creon refill.   Note from Dr. Marina Goodell from 06/19/11:  Problem # 1: DIARRHEA, CHRONIC (ICD-787.91)  The patient reports chronic postprandial diarrhea with steatorrhea. This is due to known chronic pancreatitis. I suspect the principal precipitant is a change in the dosage of pancreatic enzyme to one that is inadequate to address symptoms. As well, overtime chronic pancreatitis may worsen and the amount of enzyme needed increase. Finally, she has resumed alcohol use which can exacerbate the problem.  Plan:  #1. Change to Creon with 24,000 units of lipase per capsule. She can take one or 2 with meals. This can be titrated to desired effect.   Past Medical History    Diagnosis Date  . Diabetes mellitus   . Hypertension   . ANEMIA, CHRONIC DISEASE NEC 03/15/2006  . DEGENERATIVE JOINT DISEASE 05/06/2007    On going for >5 yrs No imaging to confirm Has tried ultram, mobic, neurontin which did not work Microbiologist tried once worked well    . 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 DIABETIC  RETINOPATHY 03/15/2006  . Hyperlipidemia 04/01/2011  . PANCREATITIS, CHRONIC 03/29/2006  . RENAL INSUFFICIENCY, CHRONIC 03/15/2006   Current Outpatient Prescriptions  Medication Sig Dispense Refill  . albuterol (PROVENTIL HFA) 108 (90 BASE) MCG/ACT inhaler Inhale 2 puffs into the lungs every 6 (six) hours as needed for wheezing.  6.7 g  1  . Blood Glucose Monitoring Suppl (ACCU-CHEK AVIVA PLUS) W/DEVICE KIT 1 Device by Does not apply route 3 (three) times daily.  1 kit  1  . Calcium Carbonate-Vitamin D (CALCIUM-VITAMIN D) 500-200 MG-UNIT per tablet Take 1 tablet by mouth daily with breakfast.      . diclofenac sodium (VOLTAREN) 1 % GEL Apply 1 application topically 4 (four) times daily.  100 g  2  . glucose blood (CHOICE DM FORA G20 TEST STRIPS) test strip Use as instructed  100 each  12  . insulin glargine (LANTUS) 100 UNIT/ML injection Inject 25 Units into the skin daily.      . Insulin Syringe-Needle U-100 31G X 5/16" 0.5 ML MISC Use to inject insulin daily.  100 each  0  . Lancet Devices (ACCU-CHEK SOFTCLIX) lancets Use as instructed  100 each  11  .  levofloxacin (LEVAQUIN) 500 MG tablet Take 1 tablet (500 mg total) by mouth daily.  7 tablet  0  . lisinopril (PRINIVIL,ZESTRIL) 40 MG tablet Take 1 tablet (40 mg total) by mouth daily.  90 tablet  1  . Pancrelipase, Lip-Prot-Amyl, (CREON) 24000 UNITS CPEP Take 1-3 capsules with meals and snacks, maximum of 10 caps/day  300 capsule  3  . polyethylene glycol (MIRALAX / GLYCOLAX) packet Take 17 g by mouth daily as needed. For constipation  14 each  5  . pregabalin (LYRICA) 50 MG capsule Take 1 capsule (50 mg total) by mouth 3  (three) times daily.  90 capsule  1  . travoprost, benzalkonium, (TRAVATAN) 0.004 % ophthalmic solution Place 1 drop into both eyes at bedtime.       No family history on file.  History   Social History  . Marital Status: Single    Spouse Name: N/A    Number of Children: N/A  . Years of Education: N/A   Social History Main Topics  . Smoking status: Current Some Day Smoker -- 0.5 packs/day    Types: Cigarettes  . Smokeless tobacco: Not on file   Comment: read to quit  . Alcohol Use: Not on file  . Drug Use: Not on file  . Sexually Active: Not on file   Other Topics Concern  . Not on file   Social History Narrative  . No narrative on file   Review of Systems: Constitutional: Denies fever, chills, appetite change.  HEENT: Denies photophobia, eye pain, redness, hearing loss, ear pain, mouth sores, trouble swallowing, neck pain, neck stiffness and tinnitus.   Respiratory: Denies SOB, DOE, cough, chest tightness,  and wheezing.   Cardiovascular: Denies chest pain, palpitations and leg swelling.  Gastrointestinal: Denies nausea, vomiting, abdominal pain, diarrhea, constipation, blood in stool and abdominal distention.  Genitourinary: Denies dysuria, urgency, frequency, hematuria, flank pain and difficulty urinating.   Skin: Denies pallor, rash and wound.  Neurological: Denies seizures, syncope, weakness, numbness and headaches.   Objective:  Physical Exam: Filed Vitals:   12/02/11 1340  BP: 124/61  Pulse: 78  Temp: 97.6 F (36.4 C)  TempSrc: Oral  Weight: 180 lb 4.8 oz (81.784 kg)  SpO2: 97%   Constitutional: Vital signs reviewed.  Patient is a well-developed and well-nourished woman in no acute distress and cooperative with exam.  Head: Normocephalic and atraumatic, no sinus tenderness Ear: Left ear with significant dark cerumen Mouth: no erythema or exudates, MMM Eyes: PERRL, EOMI, conjunctivae normal, No scleral icterus.  Cardiovascular: RRR, S1 normal, S2 normal,  +systolic murmur at right 2nd intercostal space, pulses symmetric and intact bilaterally Pulmonary/Chest: CTAB, no wheezes, rales, or rhonchi Abdominal: Soft. Non-tender, non-distended, bowel sounds are normal, no masses, organomegaly, or guarding present.  Hematology: no cervical adenopathy.  Neurological: A&O x3 Skin: Warm, dry and intact. No rash, cyanosis, or clubbing.  Psychiatric: Normal mood and affect. speech and behavior is normal. Judgment and thought content normal. Cognition and memory are normal.   Assessment & Plan:   Case and care discussed with Dr. Eben Burow Please see problem oriented charting for further details. Patient to return as soon as possible for routine follow up with PCP.

## 2011-12-02 NOTE — Assessment & Plan Note (Signed)
A1c today = 7.5.  Patient taking lantus 25u qHS.  No changes made today in setting of acute illness. Patient to f/u with PCP asap.

## 2011-12-02 NOTE — Assessment & Plan Note (Signed)
Refilled lidocaine-hydrocortisone per patient's request.

## 2011-12-02 NOTE — Assessment & Plan Note (Signed)
Refilled pancreatic enzymes today.  Note from Dr. Marina Goodell from 06/18/08:  Problem # 1: DIARRHEA, CHRONIC (ICD-787.91)  The patient reports chronic postprandial diarrhea with steatorrhea. This is due to known chronic pancreatitis. I suspect the principal precipitant is a change in the dosage of pancreatic enzyme to one that is inadequate to address symptoms. As well, overtime chronic pancreatitis may worsen and the amount of enzyme needed increase. Finally, she has resumed alcohol use which can exacerbate the problem.  Plan:  #1. Change to Creon with 24,000 units of lipase per capsule. She can take one or 2 with meals. This can be titrated to desired effect.

## 2011-12-08 ENCOUNTER — Telehealth: Payer: Self-pay | Admitting: *Deleted

## 2011-12-08 NOTE — Telephone Encounter (Signed)
When i spoke w/ her she stated the cough was no better and she needed something, stated she had taken otc robitussin w/ no help

## 2011-12-08 NOTE — Telephone Encounter (Signed)
Is she feeling the same/worse?  If no better, we can call in an antibiotic.  Thanks!

## 2011-12-08 NOTE — Telephone Encounter (Signed)
Pt calls and states she cannot afford the cough syrup, medicaid will not pay for any cough syrups, pt states she cannot afford any cost. Please advise

## 2011-12-09 MED ORDER — AZITHROMYCIN 250 MG PO TABS: ORAL_TABLET | ORAL | Status: DC

## 2011-12-09 NOTE — Telephone Encounter (Signed)
I will send in a zpack.  Thanks!

## 2011-12-09 NOTE — Addendum Note (Signed)
Addended by: Belia Heman on: 12/09/2011 05:23 PM   Modules accepted: Orders

## 2011-12-23 ENCOUNTER — Encounter: Payer: Self-pay | Admitting: Internal Medicine

## 2011-12-23 NOTE — Progress Notes (Signed)
  This patient is a CHRONIC NO-SHOW PATIENT that has a history of HYPERTENSION.  Please make sure to address hypertension during her next clinic appointment, and intervene as appropriate.    Within the AVS, please incorporate the following smartphrase: .htntips   Pertinent Data: BP Readings from Last 3 Encounters:  12/02/11 124/61  11/18/11 95/78  08/04/11 154/67    BMI: Estimated Body mass index is 30.00 kg/(m^2) as calculated from the following:   Height as of 08/04/11: 5\' 5" (1.651 m).   Weight as of 12/02/11: 180 lb 4.8 oz(81.784 kg).  Smoking History: History  Smoking status  . Current Some Day Smoker -- 0.5 packs/day  . Types: Cigarettes  Smokeless tobacco  . Not on file  Comment: read to quit    Last Basic Metabolic Panel:    Component Value Date/Time   NA 137 11/18/2011 1358   K 4.5 11/18/2011 1358   CL 106 11/18/2011 1358   CO2 19 11/18/2011 1358   BUN 39* 11/18/2011 1358   CREATININE 1.42* 11/18/2011 1358   CREATININE 1.15* 08/04/2011 1535   GLUCOSE 146* 11/18/2011 1358   CALCIUM 9.4 11/18/2011 1358    Allergies: No Known Allergies

## 2011-12-25 ENCOUNTER — Other Ambulatory Visit: Payer: Self-pay | Admitting: *Deleted

## 2011-12-25 MED ORDER — ALBUTEROL SULFATE HFA 108 (90 BASE) MCG/ACT IN AERS: 2.0000 | INHALATION_SPRAY | Freq: Four times a day (QID) | RESPIRATORY_TRACT | Status: DC | PRN

## 2012-01-13 ENCOUNTER — Other Ambulatory Visit: Payer: Self-pay | Admitting: *Deleted

## 2012-01-13 ENCOUNTER — Other Ambulatory Visit: Payer: Self-pay | Admitting: Internal Medicine

## 2012-01-13 MED ORDER — "INSULIN SYRINGE 31G X 5/16"" 0.5 ML MISC": 1.0000 | Freq: Three times a day (TID) | Status: DC

## 2012-01-13 MED ORDER — "INSULIN SYRINGE-NEEDLE U-100 31G X 5/16"" 0.5 ML MISC": Status: DC

## 2012-01-13 NOTE — Telephone Encounter (Signed)
Eprescribed. Thank you. 

## 2012-01-13 NOTE — Telephone Encounter (Signed)
Rx needs to be fax or electronic sent; cannot be called in to the pharmacy.  Thanks

## 2012-01-13 NOTE — Telephone Encounter (Signed)
Diabetic supplies cannot be called to the pharmacy.

## 2012-01-14 ENCOUNTER — Ambulatory Visit (INDEPENDENT_AMBULATORY_CARE_PROVIDER_SITE_OTHER): Payer: Medicaid Other | Admitting: Internal Medicine

## 2012-01-14 ENCOUNTER — Encounter: Payer: Self-pay | Admitting: Internal Medicine

## 2012-01-14 ENCOUNTER — Ambulatory Visit (HOSPITAL_COMMUNITY)
Admission: RE | Admit: 2012-01-14 | Discharge: 2012-01-14 | Disposition: A | Discharge status: Home / Self Care | Payer: Medicaid Other | Source: Ambulatory Visit | Attending: Internal Medicine | Admitting: Internal Medicine

## 2012-01-14 VITALS — BP 131/66 | HR 69 | Temp 98.2°F | Ht 65.0 in | Wt 182.9 lb

## 2012-01-14 DIAGNOSIS — M810 Age-related osteoporosis without current pathological fracture: Secondary | ICD-10-CM

## 2012-01-14 DIAGNOSIS — E785 Hyperlipidemia, unspecified: Secondary | ICD-10-CM

## 2012-01-14 DIAGNOSIS — M549 Dorsalgia, unspecified: Secondary | ICD-10-CM

## 2012-01-14 DIAGNOSIS — IMO0002 Reserved for concepts with insufficient information to code with codable children: Secondary | ICD-10-CM | POA: Insufficient documentation

## 2012-01-14 DIAGNOSIS — R059 Cough, unspecified: Secondary | ICD-10-CM

## 2012-01-14 DIAGNOSIS — E119 Type 2 diabetes mellitus without complications: Secondary | ICD-10-CM

## 2012-01-14 DIAGNOSIS — I10 Essential (primary) hypertension: Secondary | ICD-10-CM

## 2012-01-14 DIAGNOSIS — M47817 Spondylosis without myelopathy or radiculopathy, lumbosacral region: Secondary | ICD-10-CM | POA: Insufficient documentation

## 2012-01-14 DIAGNOSIS — R05 Cough: Secondary | ICD-10-CM

## 2012-01-14 LAB — BASIC METABOLIC PANEL WITH GFR
BUN: 46 mg/dL — ABNORMAL HIGH (ref 6–23)
Calcium: 8.9 mg/dL (ref 8.4–10.5)
GFR, Est African American: 50 mL/min — ABNORMAL LOW
GFR, Est Non African American: 44 mL/min — ABNORMAL LOW
Potassium: 4.5 mEq/L (ref 3.5–5.3)
Sodium: 134 mEq/L — ABNORMAL LOW (ref 135–145)

## 2012-01-14 LAB — LIPID PANEL
LDL Cholesterol: 64 mg/dL (ref 0–99)
Total CHOL/HDL Ratio: 2.4 Ratio
VLDL: 26 mg/dL (ref 0–40)

## 2012-01-14 MED ORDER — ALENDRONATE SODIUM 70 MG PO TABS: 70.0000 mg | ORAL_TABLET | ORAL | Status: DC

## 2012-01-14 MED ORDER — GLUCOSE BLOOD VI STRP: ORAL_STRIP | Status: DC

## 2012-01-14 MED ORDER — CHLORPHENIRAMINE MALEATE 4 MG PO TABS: 4.0000 mg | ORAL_TABLET | Freq: Four times a day (QID) | ORAL | Status: DC | PRN

## 2012-01-14 MED ORDER — INSULIN PEN NEEDLE 31G X 8 MM MISC: 1.0000 | Freq: Once | Status: DC

## 2012-01-14 NOTE — Progress Notes (Signed)
Subjective:   Patient ID: [name] female   DOB: 10-Nov-1950 61 y.o.   MRN: [id number]  HPI: 61  Year old woman with past medical history significant for type 2 diabetes mellitus, hypertension, CKD stage 2/3 presents to the clinic for a followup visit.  Cough: She reports having constant cough for last 2-3 months- mostly dry but sometimes brings whitish phlegm, occurs in spells, associated with post tussive emesis, watering in eyes, runny nose and postnasal drip. Denies any hemoptysis. Has tried OTC medicines but did not help.  She uses her inhalers only when necessary ( usually when she has chest tightness) but has used her albuterol 3 times since yesterday. Denies any chest pain, palpitations, SOB. She continues to smoke.  Back pain: She reports having recent worsening of chronic back pain - has tried tylenol but that does not help. She is really concerned about her osteoporosis- states that she just started taking her calcium pills   Past Medical History  Diagnosis Date  . Diabetes mellitus   . Hypertension   . ANEMIA, CHRONIC DISEASE NEC 03/15/2006  . DEGENERATIVE JOINT DISEASE 05/06/2007    On going for >5 yrs No imaging to confirm Has tried ultram, mobic, neurontin which did not work Microbiologist tried once worked well    . 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 DIABETIC  RETINOPATHY 03/15/2006  . Hyperlipidemia 04/01/2011  . PANCREATITIS, CHRONIC 03/29/2006  . RENAL INSUFFICIENCY, CHRONIC 03/15/2006   No family history on file. History   Social History  . Marital Status: Single    Spouse Name: N/A    Number of Children: N/A  . Years of Education: N/A   Occupational History  . Not on file.   Social History Main Topics  . Smoking status: Current Some Day Smoker -- 0.5 packs/day    Types: Cigarettes  . Smokeless tobacco: Not on file     Comment: read to quit  . Alcohol Use: Not on file  . Drug Use: Not on file  . Sexually Active: Not on file   Other Topics Concern  . Not  on file   Social History Narrative  . No narrative on file   Review of Systems: General: Denies fever, chills, diaphoresis, appetite change and fatigue. HEENT: Denies photophobia, eye pain, redness, hearing loss, ear pain, congestion, sore throat, rhinorrhea, sneezing, mouth sores, trouble swallowing, neck pain, neck stiffness and tinnitus. Respiratory: Denies SOB, DOE, , chest tightness, and wheezing,  +cough Cardiovascular: Denies to chest pain, palpitations and leg swelling. Gastrointestinal: Denies nausea, vomiting, abdominal pain, diarrhea, constipation, blood in stool and abdominal distention. Genitourinary: Denies dysuria, urgency, frequency, hematuria, flank pain and difficulty urinating. Musculoskeletal: Denies myalgias, , joint swelling, arthralgias and gait problem. +back pain Skin: Denies pallor, rash and wound. Neurological: Denies dizziness, seizures, syncope, weakness, light-headedness, numbness and headaches. Hematological: Denies adenopathy, easy bruising, personal or family bleeding history. Psychiatric/Behavioral: Denies suicidal ideation, mood changes, confusion, nervousness, sleep disturbance and agitation.    Current Outpatient Medications: Current Outpatient Prescriptions  Medication Sig Dispense Refill  . albuterol (PROVENTIL HFA) 108 (90 BASE) MCG/ACT inhaler Inhale 2 puffs into the lungs every 6 (six) hours as needed for wheezing.  6.7 g  1  . azithromycin (ZITHROMAX Z-PAK) 250 MG tablet 2 po day one, then 1 daily x 4 days  6 tablet  0  . Blood Glucose Monitoring Suppl (ACCU-CHEK AVIVA PLUS) W/DEVICE KIT 1 Device by Does not apply route 3 (three) times daily.  1 kit  1  . Calcium Carbonate-Vitamin D (CALCIUM-VITAMIN D) 500-200 MG-UNIT per tablet Take 1 tablet by mouth daily with breakfast.      . chlorpheniramine-HYDROcodone (TUSSIONEX PENNKINETIC ER) 10-8 MG/5ML LQCR Take 5 mLs by mouth every 12 (twelve) hours as needed (cough).  140 mL  0  . diclofenac sodium  (VOLTAREN) 1 % GEL Apply 1 application topically 4 (four) times daily.  100 g  2  . insulin glargine (LANTUS) 100 UNIT/ML injection Inject 25 Units into the skin daily.      . Insulin Syringe-Needle U-100 (INSULIN SYRINGE .5CC/31GX5/16") 31G X 5/16" 0.5 ML MISC Inject 1 each into the skin 3 (three) times daily.  100 each  11  . Lidocaine-Hydrocortisone Ace 3-0.5 % CREA Apply 1 application topically 2 (two) times daily as needed.  1 Tube  0  . lisinopril (PRINIVIL,ZESTRIL) 40 MG tablet Take 1 tablet (40 mg total) by mouth daily.  90 tablet  1  . Pancrelipase, Lip-Prot-Amyl, (CREON) 24000 UNITS CPEP Take 1-3 capsules with meals and snacks, maximum of 10 caps/day  300 capsule  3  . polyethylene glycol (MIRALAX / GLYCOLAX) packet Take 17 g by mouth daily as needed. For constipation  14 each  5  . pregabalin (LYRICA) 50 MG capsule Take 1 capsule (50 mg total) by mouth 3 (three) times daily.  90 capsule  1  . travoprost, benzalkonium, (TRAVATAN) 0.004 % ophthalmic solution Place 1 drop into both eyes at bedtime.        Allergies: No Known Allergies    Objective:   Physical Exam: Filed Vitals:   01/14/12 0954  BP: 131/66  Pulse: 69  Temp: 98.2 F (36.8 C)    General: Vital signs reviewed and noted. Well-developed, well-nourished, in no acute distress; alert, appropriate and cooperative throughout examination. Head: Normocephalic, atraumatic Lungs: Normal respiratory effort. Clear to auscultation BL without crackles or wheezes. Heart: RRR. S1 and S2 normal without gallop, murmur, or rubs. Abdomen:BS normoactive. Soft, Nondistended, non-tender.  No masses or organomegaly. Extremities: No pretibial edema. Back: mild lower back spinal and paraspinal tenderness     Assessment & Plan:

## 2012-01-14 NOTE — Patient Instructions (Addendum)
Please schedule a follow up appointment in 1 month with your PCP . Please bring your medication bottles with your next appointment. Please take your medicines as prescribed. Please call us if you experience any side effects from the new medication. I will call you with your lab results if anything will be abnormal.   WEIGHT REDUCTION:  Strategies: A healthy weight loss program includes:  A calorie restricted diet based on individual calorie needs.   Increased physical activity (exercise).  An exercise program is just as important as the right low-calorie diet.    An unhealthy weight loss program includes:  Fasting.   Fad diets.   Supplements and drugs.  These choices do not succeed in long-term weight control.   Home Care Instructions: To help you make the needed dietary changes:   Exercise and perform physical activity as directed by your caregiver.   Keep a daily record of everything you eat. There are many free websites to help you with this. It may be helpful to measure your foods so you can determine if you are eating the correct portion sizes.   Use low-calorie cookbooks or take special cooking classes.   Avoid alcohol. Drink more water and drinks with no calories.   Take vitamins and supplements only as recommended by your caregiver.   Weight loss support groups, Registered Dieticians, counselors, and stress reduction education can also be very helpful.   ________________________________________________________________________  DASH DIET:  The DASH diet stands for "Dietary Approaches to Stop Hypertension." It is a healthy eating plan that has been shown to reduce high blood pressure (hypertension) in as little as 14 days, while also possibly providing other significant health benefits. These other health benefits include reducing the risk of breast cancer after menopause and reducing the risk of type 2 diabetes, heart disease, colon cancer, and stroke. Health  benefits also include weight loss and slowing kidney failure in patients with chronic kidney disease.   Diet guidelines: Limit salt (sodium). Your diet should contain less than 1500 mg of sodium daily.  Limit refined or processed carbohydrates. Your diet should include mostly whole grains. Desserts and added sugars should be used sparingly.  Include small amounts of heart-healthy fats. These types of fats include nuts, oils, and tub margarine. Limit saturated and trans fats. These fats have been shown to be harmful in the body.   Choosing Foods: The following food groups are based on a 2000 calorie diet. See your Registered Dietitian for individual calorie needs.  Grains and Grain Products (6 to 8 servings daily)  Eat More Often: Whole-wheat bread, brown rice, whole-grain or wheat pasta, quinoa, popcorn without added fat or salt (air popped).  Eat Less Often: White bread, white pasta, white rice, cornbread.  Vegetables (4 to 5 servings daily)  Eat More Often: Fresh, frozen, and canned vegetables. Vegetables may be raw, steamed, roasted, or grilled with a minimal amount of fat.  Eat Less Often/Avoid: Creamed or fried vegetables. Vegetables in a cheese sauce.  Fruit (4 to 5 servings daily)  Eat More Often: All fresh, canned (in natural juice), or frozen fruits. Dried fruits without added sugar. One hundred percent fruit juice ( cup [237 mL] daily).  Eat Less Often: Dried fruits with added sugar. Canned fruit in light or heavy syrup.  [employer], Fish, and Poultry (2 servings or less daily. One serving is 3 to 4 oz [85-114 g]).  Eat More Often: Ninety percent or leaner ground beef, tenderloin, sirloin. Round cuts of beef,  chicken breast, [place] breast. All fish. Grill, bake, or broil your meat. Nothing should be fried.  Eat Less Often/Avoid: Fatty cuts of meat, [place], or chicken leg, thigh, or wing. Fried cuts of meat or fish.  Dairy (2 to 3 servings)  Eat More Often: Low-fat or fat-free milk,  low-fat plain or light yogurt, reduced-fat or part-skim cheese.  Eat Less Often/Avoid: Milk (whole, 2%, skim, or chocolate). Whole milk yogurt. Full-fat cheeses.  Nuts, Seeds, and Legumes (4 to 5 servings per week)  Eat More Often: All without added salt.  Eat Less Often/Avoid: Salted nuts and seeds, canned beans with added salt.  Fats and Sweets (limited)  Eat More Often: Vegetable oils, tub margarines without trans fats, sugar-free gelatin. Mayonnaise and salad dressings.  Eat Less Often/Avoid: Coconut oils, palm oils, butter, stick margarine, cream, half and half, cookies, candy, pie.   ________________________________________________________________________  Smoking Cessation Tips 1-800-QUIT-NOW  This document explains the best ways for you to quit smoking and new treatments to help. It lists new medicines that can double or triple your chances of quitting and quitting for good. It also considers ways to avoid relapses and concerns you may have about quitting, including weight gain.   Nicotine: A Powerful Addiction If you have tried to quit smoking, you know how hard it can be. It is hard because nicotine is a very addictive drug. For some people, it can be as addictive as heroin or cocaine. Usually, people make 2 or 3 tries, or more, before finally being able to quit. Each time you try to quit, you can learn about what helps and what hurts. Quitting takes hard work and a lot of effort, but you can quit smoking.   Quitting smoking is one of the most important things you will ever do You will live longer, feel better, and live better.  The impact on your body of quitting smoking is felt almost immediately:   Five keys to quitting: Studies have shown that these 5 steps will help you quit smoking and quit for good. You have the best chances of quitting if you use them together:   1. GET READY  Set a quit date.  Change your environment.  Get rid of ALL cigarettes, ashtrays, matches, and  lighters in your home, car, and place of work.  Do not let people smoke in your home.  Review your past attempts to quit. Think about what worked and what did not.  Once you quit, do not smoke. NOT EVEN A PUFF!   2. GET SUPPORT AND ENCOURAGEMENT  Tell your family, friends, and coworkers that you are going to quit and need their support. Ask them not to smoke around you.  Get individual, group, or telephone counseling and support.  Many smokers find one or more of the many self-help books available useful in helping them quit and stay off tobacco.   3. LEARN NEW SKILLS AND BEHAVIORS  Try to distract yourself from urges to smoke. Talk to someone, go for a walk, or occupy your time with a task.  When you first try to quit, change your routine. Take a different route to work. Drink tea instead of coffee. Eat breakfast in a different place.  Do something to reduce your stress. Take a hot bath, exercise, or read a book.  Plan something enjoyable to do every day. Reward yourself for not smoking.  Explore interactive web-based programs that specialize in helping you quit.   4. GET MEDICINE AND USE IT  CORRECTLY .  Medicines can help you stop smoking and decrease the urge to smoke. Combining medicine with the above behavioral methods and support can quadruple your chances of successfully quitting smoking.  Talk with your doctor about these options.  5. BE PREPARED FOR RELAPSE OR DIFFICULT SITUATIONS  Most relapses occur within the first 3 months after quitting. Do not be discouraged if you start smoking again. Remember, most people try several times before they finally quit.  You may have symptoms of withdrawal because your body is used to nicotine. You may crave cigarettes, be irritable, feel very hungry, cough often, get headaches, or have difficulty concentrating.  The withdrawal symptoms are only temporary. They are strongest when you first quit, but they will go away within 10 to 14 days.    Quitting takes hard work and a lot of effort, but you can quit smoking.   FOR MORE INFORMATION  Smokefree.gov (http://www.davis-sullivan.com/) provides free, accurate, evidence-based information and professional assistance to help support the immediate and long-term needs of people trying to quit smoking.  Document Released: 02/03/2001 Document Re-Released: 07/30/2009  [facility] Patient Information 2011 [place], [place].

## 2012-01-15 NOTE — Progress Notes (Signed)
Pt aware of appt for bone density 01/27/12 10:15AM [facility] Yoshiaki Kreuser RN 01/15/12 3PM

## 2012-01-17 NOTE — Assessment & Plan Note (Signed)
Reports recent worsening of her chronic low back pain. -Repeat DEXA scan -X-ray of lumbar spine to screen for any compression fractures. -Start her on Fosamax. Given the instructions to sit upright for at least 30 minutes after taking Fosamax with one full glass of water to avoid esophagitis. -Continue Calcium and vitamin D supplementation

## 2012-01-17 NOTE — Assessment & Plan Note (Signed)
BP Mildly elevated today likely 2/2 pain or distress from coughing. Continue current regimen for now. Check BMET today.  BP Readings from Last 3 Encounters:  01/14/12 131/66  12/02/11 124/61  11/18/11 95/78

## 2012-01-17 NOTE — Assessment & Plan Note (Signed)
She did not bring her meter today but reports that her blood sugars are under good control without any hypoglycemic events. Continue current regimen for now.  She was advised to bring her meter with next appointments.

## 2012-01-17 NOTE — Assessment & Plan Note (Signed)
Check lipid panel today 

## 2012-01-17 NOTE — Assessment & Plan Note (Signed)
She continues to have cough that she describes as mostly dry, intermittently associated with some whitish phlegm for last 2 months. Her cough is associated with posttussive emesis, runny nose, watery eyes and postnasal drip. She doesn't remember exactly for how long has she been on ACE- I but states it's been  for almost a year. Denies having any reflux symptoms.  -Would give her a  trial of first generation antihistaminics( chlorpheniramine). -Advised warm saline gargles and steam inhalation. -She was taking over-the-counter Robitussin and was encouraged to continue doing the same. - Call us if symptoms fails to improve in 2 - 3 weeks

## 2012-01-27 ENCOUNTER — Ambulatory Visit (HOSPITAL_COMMUNITY)
Admission: RE | Admit: 2012-01-27 | Discharge: 2012-01-27 | Disposition: A | Discharge status: Home / Self Care | Payer: Medicaid Other | Source: Ambulatory Visit | Attending: Internal Medicine | Admitting: Internal Medicine

## 2012-01-27 ENCOUNTER — Other Ambulatory Visit: Payer: Self-pay | Admitting: *Deleted

## 2012-01-27 DIAGNOSIS — I10 Essential (primary) hypertension: Secondary | ICD-10-CM

## 2012-01-27 DIAGNOSIS — Z13820 Encounter for screening for osteoporosis: Secondary | ICD-10-CM | POA: Insufficient documentation

## 2012-01-27 DIAGNOSIS — Z780 Asymptomatic menopausal state: Secondary | ICD-10-CM | POA: Insufficient documentation

## 2012-01-27 DIAGNOSIS — M810 Age-related osteoporosis without current pathological fracture: Secondary | ICD-10-CM

## 2012-01-27 DIAGNOSIS — N259 Disorder resulting from impaired renal tubular function, unspecified: Secondary | ICD-10-CM

## 2012-01-27 DIAGNOSIS — R232 Flushing: Secondary | ICD-10-CM

## 2012-01-27 DIAGNOSIS — D638 Anemia in other chronic diseases classified elsewhere: Secondary | ICD-10-CM

## 2012-01-27 DIAGNOSIS — M1990 Unspecified osteoarthritis, unspecified site: Secondary | ICD-10-CM

## 2012-01-27 DIAGNOSIS — M549 Dorsalgia, unspecified: Secondary | ICD-10-CM | POA: Insufficient documentation

## 2012-01-27 DIAGNOSIS — N189 Chronic kidney disease, unspecified: Secondary | ICD-10-CM

## 2012-01-27 DIAGNOSIS — E119 Type 2 diabetes mellitus without complications: Secondary | ICD-10-CM

## 2012-01-27 DIAGNOSIS — L819 Disorder of pigmentation, unspecified: Secondary | ICD-10-CM

## 2012-01-28 ENCOUNTER — Other Ambulatory Visit: Payer: Self-pay | Admitting: Internal Medicine

## 2012-01-28 ENCOUNTER — Telehealth: Payer: Self-pay | Admitting: *Deleted

## 2012-01-28 DIAGNOSIS — D638 Anemia in other chronic diseases classified elsewhere: Secondary | ICD-10-CM

## 2012-01-28 DIAGNOSIS — M1990 Unspecified osteoarthritis, unspecified site: Secondary | ICD-10-CM

## 2012-01-28 DIAGNOSIS — N259 Disorder resulting from impaired renal tubular function, unspecified: Secondary | ICD-10-CM

## 2012-01-28 DIAGNOSIS — L819 Disorder of pigmentation, unspecified: Secondary | ICD-10-CM

## 2012-01-28 DIAGNOSIS — M810 Age-related osteoporosis without current pathological fracture: Secondary | ICD-10-CM

## 2012-01-28 DIAGNOSIS — N189 Chronic kidney disease, unspecified: Secondary | ICD-10-CM

## 2012-01-28 DIAGNOSIS — R232 Flushing: Secondary | ICD-10-CM

## 2012-01-28 DIAGNOSIS — I10 Essential (primary) hypertension: Secondary | ICD-10-CM

## 2012-01-28 DIAGNOSIS — E119 Type 2 diabetes mellitus without complications: Secondary | ICD-10-CM

## 2012-01-28 MED ORDER — LISINOPRIL 40 MG PO TABS: 40.0000 mg | ORAL_TABLET | Freq: Every day | ORAL | Status: DC

## 2012-01-28 MED ORDER — INSULIN GLARGINE 100 UNIT/ML ~~LOC~~ SOLN: 25.0000 [IU] | Freq: Every day | SUBCUTANEOUS | Status: DC

## 2012-01-28 MED ORDER — LISINOPRIL 10 MG PO TABS: 10.0000 mg | ORAL_TABLET | Freq: Every day | ORAL | Status: DC

## 2012-01-28 NOTE — Telephone Encounter (Signed)
Call from [facility] wanting to verify dose of lisinopril.  They see an increase from lisinopril 10 mg to lisinopril 40 mg! I looked up record in chart and lisinopril was changed on 08/04/11 by Dr Scot Dock with orders to call in the RX.  This was never done and patient has been taking lisinopril 10 mg.  BP's have been good. Pt called to verify dose and her bottle - no answer.  Will try again.  Please advise Pharmacy # [phone]

## 2012-01-28 NOTE — Telephone Encounter (Signed)
Pt called and verified she takes lisinopril 10 mg. Rx called in and d/c lisinopril 40 mg. Order given to pharmacist - Kathlene November Dr Dierdre Searles aware

## 2012-02-23 ENCOUNTER — Encounter: Payer: Self-pay | Admitting: Internal Medicine

## 2012-02-25 ENCOUNTER — Ambulatory Visit: Payer: Medicaid Other | Admitting: Internal Medicine

## 2012-03-25 ENCOUNTER — Other Ambulatory Visit: Payer: Self-pay | Admitting: *Deleted

## 2012-03-25 DIAGNOSIS — M549 Dorsalgia, unspecified: Secondary | ICD-10-CM

## 2012-03-28 ENCOUNTER — Encounter: Payer: Self-pay | Admitting: Internal Medicine

## 2012-03-28 ENCOUNTER — Ambulatory Visit (INDEPENDENT_AMBULATORY_CARE_PROVIDER_SITE_OTHER): Payer: Medicaid Other | Admitting: Internal Medicine

## 2012-03-28 VITALS — BP 121/67 | HR 68 | Temp 97.6°F | Resp 20 | Ht 65.0 in | Wt 181.7 lb

## 2012-03-28 DIAGNOSIS — Z79899 Other long term (current) drug therapy: Secondary | ICD-10-CM

## 2012-03-28 DIAGNOSIS — F17200 Nicotine dependence, unspecified, uncomplicated: Secondary | ICD-10-CM

## 2012-03-28 DIAGNOSIS — R05 Cough: Secondary | ICD-10-CM

## 2012-03-28 DIAGNOSIS — N189 Chronic kidney disease, unspecified: Secondary | ICD-10-CM

## 2012-03-28 DIAGNOSIS — Z23 Encounter for immunization: Secondary | ICD-10-CM

## 2012-03-28 DIAGNOSIS — M549 Dorsalgia, unspecified: Secondary | ICD-10-CM | POA: Insufficient documentation

## 2012-03-28 DIAGNOSIS — M810 Age-related osteoporosis without current pathological fracture: Secondary | ICD-10-CM

## 2012-03-28 DIAGNOSIS — Z1239 Encounter for other screening for malignant neoplasm of breast: Secondary | ICD-10-CM | POA: Insufficient documentation

## 2012-03-28 DIAGNOSIS — I10 Essential (primary) hypertension: Secondary | ICD-10-CM

## 2012-03-28 DIAGNOSIS — E119 Type 2 diabetes mellitus without complications: Secondary | ICD-10-CM

## 2012-03-28 LAB — BASIC METABOLIC PANEL WITH GFR
CO2: 18 mEq/L — ABNORMAL LOW (ref 19–32)
Calcium: 9.2 mg/dL (ref 8.4–10.5)
Creat: 1.37 mg/dL — ABNORMAL HIGH (ref 0.50–1.10)
GFR, Est African American: 48 mL/min — ABNORMAL LOW
Glucose, Bld: 155 mg/dL — ABNORMAL HIGH (ref 70–99)
Sodium: 138 mEq/L (ref 135–145)

## 2012-03-28 LAB — GLUCOSE, CAPILLARY: Glucose-Capillary: 137 mg/dL — ABNORMAL HIGH (ref 70–99)

## 2012-03-28 MED ORDER — ALENDRONATE SODIUM 70 MG PO TABS: 70.0000 mg | ORAL_TABLET | ORAL | Status: DC

## 2012-03-28 MED ORDER — LOSARTAN POTASSIUM 25 MG PO TABS: 25.0000 mg | ORAL_TABLET | Freq: Every day | ORAL | Status: DC

## 2012-03-28 MED ORDER — GABAPENTIN 300 MG PO CAPS: 300.0000 mg | ORAL_CAPSULE | Freq: Three times a day (TID) | ORAL | Status: DC

## 2012-03-28 NOTE — Assessment & Plan Note (Signed)
Flu vaccination was given today

## 2012-03-28 NOTE — Assessment & Plan Note (Signed)
Patient complains about cough likely due to smoking rather ACEi but patient is convinced . I will give a trial  Losartan and d/c Lisinopril.  Will reevaluate her BP in 2- 3 weeks.

## 2012-03-28 NOTE — Assessment & Plan Note (Signed)
Referred patient for Mammogram

## 2012-03-28 NOTE — Assessment & Plan Note (Signed)
Likely radiculopathy. Xray of the spine in 10/2011 was notable for degenerative disease. No warning signs notable including leg weakness, bladder or bowel dysfunction fevers or chills. Will refer patient to PT and start her on gabapentin.

## 2012-03-28 NOTE — Assessment & Plan Note (Addendum)
Most likely due to smoking.  Patient is convinced it is due to Lisinopril . I will give a trial. I will d/c it and start an an ARB. Counseled her to stop smoking. If no improvement I would refer patient for PFT.  Patient has not signs of GERD but that would be another DD

## 2012-03-28 NOTE — Assessment & Plan Note (Signed)
Refill given for Fosamax. Will obtain Bmet today to monitor calcium level

## 2012-03-28 NOTE — Assessment & Plan Note (Signed)
Counseled on smoking cessation. Hand out provided  

## 2012-03-28 NOTE — Progress Notes (Signed)
Subjective:   Patient ID: [name] female   DOB: [birth year] 62 y.o.   MRN: [id number]  HPI: [name] is a 62 y.o. female with PMH significant as outlined below who presented to the clinic for follow up of cough and left leg pain   1. Cough: Cxray on 10/2011 - no active issues Continues to have a dry cough almost daily. Denies any chest pain or SOB. No weight loss or night sweats. Smokes 2- 10 cigarettes  A day. She tried not to smoke for 2-3 days but continues to have cough  2. Left leg pain/back pain: Mild multilevel degenerative disc disease and lumbar spondylosis, including 3 mm of anterolisthesis of L4 upon L5.  She was prescribed Fosamax  01/14/12, she took it for one month but did not refill it .  Taking calcium and Vitamin D daily  Complains about back pain: radiates down his leg. Patient noted that in the morning she feels her leg will give out on her. Denies any fall. Denies any urinary or bladder changes. Denies any fevers or chills.   3. DM: did not bring in the meter but noted that when she check it in the morning 100-120.      Past Medical History  Diagnosis Date  . Diabetes mellitus   . Hypertension   . ANEMIA, CHRONIC DISEASE NEC 03/15/2006  . DEGENERATIVE JOINT DISEASE 05/06/2007    On going for >5 yrs No imaging to confirm Has tried ultram, mobic, neurontin which did not work Microbiologist tried once worked well    . 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 DIABETIC  RETINOPATHY 03/15/2006  . Hyperlipidemia 04/01/2011  . PANCREATITIS, CHRONIC 03/29/2006  . RENAL INSUFFICIENCY, CHRONIC 03/15/2006   Current Outpatient Prescriptions  Medication Sig Dispense Refill  . albuterol (PROVENTIL HFA) 108 (90 BASE) MCG/ACT inhaler Inhale 2 puffs into the lungs every 6 (six) hours as needed for wheezing.  6.7 g  1  . alendronate (FOSAMAX) 70 MG tablet Take 1 tablet (70 mg total) by mouth every 7 (seven) days. Take with a full glass of water on an empty stomach.  8 tablet   0  . Blood Glucose Monitoring Suppl (ACCU-CHEK AVIVA PLUS) W/DEVICE KIT 1 Device by Does not apply route 3 (three) times daily.  1 kit  1  . Calcium Carbonate-Vitamin D (CALCIUM-VITAMIN D) 500-200 MG-UNIT per tablet Take 1 tablet by mouth daily with breakfast.      . chlorpheniramine (CHLOR-TRIMETON) 4 MG tablet Take 1 tablet (4 mg total) by mouth every 6 (six) hours as needed for allergies.  40 tablet  0  . chlorpheniramine-HYDROcodone (TUSSIONEX PENNKINETIC ER) 10-8 MG/5ML LQCR Take 5 mLs by mouth every 12 (twelve) hours as needed (cough).  140 mL  0  . diclofenac sodium (VOLTAREN) 1 % GEL Apply 1 application topically 4 (four) times daily.  100 g  2  . glucose blood test strip Use as instructed  100 each  12  . insulin glargine (LANTUS) 100 UNIT/ML injection Inject 25 Units into the skin daily.  30 mL  1  . Insulin Pen Needle 31G X 8 MM MISC 1 each by Does not apply route once.  100 each  0  . Insulin Syringe-Needle U-100 (INSULIN SYRINGE .5CC/31GX5/16") 31G X 5/16" 0.5 ML MISC Inject 1 each into the skin 3 (three) times daily.  100 each  11  . Lidocaine-Hydrocortisone Ace 3-0.5 % CREA Apply 1 application topically 2 (two) times daily as needed.  1 Tube  0  . lisinopril (PRINIVIL,ZESTRIL) 10 MG tablet Take 1 tablet (10 mg total) by mouth daily.  30 tablet  11  . Pancrelipase, Lip-Prot-Amyl, (CREON) 24000 UNITS CPEP Take 1-3 capsules with meals and snacks, maximum of 10 caps/day  300 capsule  3  . polyethylene glycol (MIRALAX / GLYCOLAX) packet Take 17 g by mouth daily as needed. For constipation  14 each  5  . pregabalin (LYRICA) 50 MG capsule Take 1 capsule (50 mg total) by mouth 3 (three) times daily.  90 capsule  1  . travoprost, benzalkonium, (TRAVATAN) 0.004 % ophthalmic solution Place 1 drop into both eyes at bedtime.       No family history on file. History   Social History  . Marital Status: Single    Spouse Name: N/A    Number of Children: N/A  . Years of Education: N/A   Social  History Main Topics  . Smoking status: Current Every Day Smoker -- 0.5 packs/day    Types: Cigarettes  . Smokeless tobacco: Not on file     Comment: Quit Line - wants to know if can get some patches  . Alcohol Use: Not on file  . Drug Use: Not on file  . Sexually Active: Not on file   Other Topics Concern  . Not on file   Social History Narrative  . No narrative on file   Review of Systems: Constitutional: Denies fever, chills, diaphoresis, appetite change and fatigue.  HEENT: Deniessore throat, rhinorrhea, sneezing, mouth sores, trouble swallowing, neck pain, neck stiffness and tinnitus.   Respiratory: Denies SOB, DOE,  chest tightness,  and wheezing.   Cardiovascular: Denies chest pain, palpitations and leg swelling.  Gastrointestinal: Denies nausea, vomiting, abdominal pain, diarrhea, constipation, blood in stool and abdominal distention.  Genitourinary: Denies dysuria, urgency, frequency, hematuria, flank pain and difficulty urinating.  Musculoskeletal: Noted myalgias, back pain,arthralgias but denies gait problem.  Skin: Denies pallor, rash and wound.  Neurological: Denies dizziness,  weakness, light-headedness, numbness and headaches.     Objective:  Physical Exam: Filed Vitals:   03/28/12 0900  BP: 121/67  Pulse: 68  Temp: 97.6 F (36.4 C)  TempSrc: Oral  Resp: 20  Height: 5\' 5"  (1.651 m)  Weight: 181 lb 11.2 oz (82.419 kg)  SpO2: 99%   Constitutional: Vital signs reviewed.  Patient is a well-developed and well-nourished female in no acute distress and cooperative with exam. Alert and oriented x3.  Neck: Supple,  Cardiovascular: RRR, S1 normal, S2 normal, no MRG, pulses symmetric and intact bilaterally Pulmonary/Chest: CTAB, no wheezes, rales, or rhonchi Abdominal: Soft. Non-tender, non-distended, bowel sounds are normal,  GU: no CVA tenderness Musculoskeletal: No joint deformities, erythema, or stiffness, ROM full and no nontender. Negative straight leg raise.   Neurological: A&O x3, Strength is normal and symmetric bilaterally, no focal motor deficit, sensory intact to light touch bilaterally.  Skin: Warm, dry and intact. No rash, cyanosis, or clubbing.

## 2012-03-28 NOTE — Assessment & Plan Note (Signed)
Diabetes well controlled. Will continue current regimen.

## 2012-03-29 LAB — MICROALBUMIN / CREATININE URINE RATIO
Creatinine, Urine: 105.2 mg/dL
Microalb, Ur: 6.3 mg/dL — ABNORMAL HIGH (ref 0.00–1.89)

## 2012-03-31 NOTE — Telephone Encounter (Signed)
review 

## 2012-04-05 ENCOUNTER — Ambulatory Visit: Payer: Medicaid Other | Attending: Internal Medicine

## 2012-04-11 ENCOUNTER — Ambulatory Visit: Payer: Medicaid Other

## 2012-04-13 ENCOUNTER — Ambulatory Visit: Payer: Medicaid Other | Admitting: Internal Medicine

## 2012-04-21 ENCOUNTER — Other Ambulatory Visit: Payer: Self-pay | Admitting: Internal Medicine

## 2012-04-25 NOTE — Addendum Note (Signed)
Addended by: Neomia Dear on: 04/25/2012 05:24 PM   Modules accepted: Orders

## 2012-04-28 ENCOUNTER — Encounter: Payer: Medicaid Other | Admitting: Internal Medicine

## 2012-05-04 ENCOUNTER — Ambulatory Visit
Admission: RE | Admit: 2012-05-04 | Discharge: 2012-05-04 | Disposition: A | Discharge status: Home / Self Care | Payer: Medicaid Other | Source: Ambulatory Visit | Attending: Internal Medicine | Admitting: Internal Medicine

## 2012-05-04 DIAGNOSIS — Z1239 Encounter for other screening for malignant neoplasm of breast: Secondary | ICD-10-CM

## 2012-05-23 ENCOUNTER — Other Ambulatory Visit: Payer: Self-pay | Admitting: *Deleted

## 2012-05-23 MED ORDER — ALBUTEROL SULFATE HFA 108 (90 BASE) MCG/ACT IN AERS: 2.0000 | INHALATION_SPRAY | Freq: Four times a day (QID) | RESPIRATORY_TRACT | Status: DC | PRN

## 2012-06-03 ENCOUNTER — Other Ambulatory Visit: Payer: Self-pay

## 2012-06-03 ENCOUNTER — Emergency Department (HOSPITAL_COMMUNITY): Payer: Medicaid Other

## 2012-06-03 ENCOUNTER — Encounter (HOSPITAL_COMMUNITY): Payer: Self-pay | Admitting: Cardiology

## 2012-06-03 ENCOUNTER — Emergency Department (HOSPITAL_COMMUNITY)
Admission: EM | Admit: 2012-06-03 | Discharge: 2012-06-04 | Disposition: A | Discharge status: Home / Self Care | Payer: Medicaid Other | Attending: Emergency Medicine | Admitting: Emergency Medicine

## 2012-06-03 DIAGNOSIS — Z8719 Personal history of other diseases of the digestive system: Secondary | ICD-10-CM | POA: Insufficient documentation

## 2012-06-03 DIAGNOSIS — E785 Hyperlipidemia, unspecified: Secondary | ICD-10-CM | POA: Insufficient documentation

## 2012-06-03 DIAGNOSIS — M546 Pain in thoracic spine: Secondary | ICD-10-CM | POA: Insufficient documentation

## 2012-06-03 DIAGNOSIS — E1149 Type 2 diabetes mellitus with other diabetic neurological complication: Secondary | ICD-10-CM | POA: Insufficient documentation

## 2012-06-03 DIAGNOSIS — E11319 Type 2 diabetes mellitus with unspecified diabetic retinopathy without macular edema: Secondary | ICD-10-CM | POA: Insufficient documentation

## 2012-06-03 DIAGNOSIS — Z79899 Other long term (current) drug therapy: Secondary | ICD-10-CM | POA: Insufficient documentation

## 2012-06-03 DIAGNOSIS — N189 Chronic kidney disease, unspecified: Secondary | ICD-10-CM | POA: Insufficient documentation

## 2012-06-03 DIAGNOSIS — Z794 Long term (current) use of insulin: Secondary | ICD-10-CM | POA: Insufficient documentation

## 2012-06-03 DIAGNOSIS — G909 Disorder of the autonomic nervous system, unspecified: Secondary | ICD-10-CM | POA: Insufficient documentation

## 2012-06-03 DIAGNOSIS — E1339 Other specified diabetes mellitus with other diabetic ophthalmic complication: Secondary | ICD-10-CM | POA: Insufficient documentation

## 2012-06-03 DIAGNOSIS — F17200 Nicotine dependence, unspecified, uncomplicated: Secondary | ICD-10-CM | POA: Insufficient documentation

## 2012-06-03 DIAGNOSIS — E1129 Type 2 diabetes mellitus with other diabetic kidney complication: Secondary | ICD-10-CM | POA: Insufficient documentation

## 2012-06-03 DIAGNOSIS — M549 Dorsalgia, unspecified: Secondary | ICD-10-CM

## 2012-06-03 DIAGNOSIS — I129 Hypertensive chronic kidney disease with stage 1 through stage 4 chronic kidney disease, or unspecified chronic kidney disease: Secondary | ICD-10-CM | POA: Insufficient documentation

## 2012-06-03 DIAGNOSIS — R0602 Shortness of breath: Secondary | ICD-10-CM | POA: Insufficient documentation

## 2012-06-03 DIAGNOSIS — R079 Chest pain, unspecified: Secondary | ICD-10-CM

## 2012-06-03 DIAGNOSIS — Z8739 Personal history of other diseases of the musculoskeletal system and connective tissue: Secondary | ICD-10-CM | POA: Insufficient documentation

## 2012-06-03 DIAGNOSIS — Z862 Personal history of diseases of the blood and blood-forming organs and certain disorders involving the immune mechanism: Secondary | ICD-10-CM | POA: Insufficient documentation

## 2012-06-03 DIAGNOSIS — R071 Chest pain on breathing: Secondary | ICD-10-CM | POA: Insufficient documentation

## 2012-06-03 LAB — CBC
HCT: 30.2 % — ABNORMAL LOW (ref 36.0–46.0)
MCHC: 34.8 g/dL (ref 30.0–36.0)
Platelets: 141 10*3/uL — ABNORMAL LOW (ref 150–400)
RDW: 14.6 % (ref 11.5–15.5)
WBC: 4.3 10*3/uL (ref 4.0–10.5)

## 2012-06-03 LAB — BASIC METABOLIC PANEL
BUN: 47 mg/dL — ABNORMAL HIGH (ref 6–23)
Creatinine, Ser: 1.76 mg/dL — ABNORMAL HIGH (ref 0.50–1.10)
GFR calc Af Amer: 35 mL/min — ABNORMAL LOW (ref >90)
GFR calc non Af Amer: 30 mL/min — ABNORMAL LOW (ref >90)
Potassium: 4.5 mEq/L (ref 3.5–5.1)

## 2012-06-03 LAB — D-DIMER, QUANTITATIVE: D-Dimer, Quant: 0.49 ug/mL-FEU — ABNORMAL HIGH (ref 0.00–0.48)

## 2012-06-03 LAB — POCT I-STAT TROPONIN I: Troponin i, poc: 0 ng/mL (ref 0.00–0.08)

## 2012-06-03 MED ORDER — ASPIRIN 81 MG PO CHEW
324.0000 mg | CHEWABLE_TABLET | Freq: Once | ORAL | Status: AC
  Administered 2012-06-03: 324 mg via ORAL
  Filled 2012-06-03: qty 4

## 2012-06-03 MED ORDER — IOHEXOL 350 MG/ML SOLN
80.0000 mL | Freq: Once | INTRAVENOUS | Status: AC | PRN
  Administered 2012-06-03: 80 mL via INTRAVENOUS

## 2012-06-03 MED ORDER — ONDANSETRON HCL 4 MG/2ML IJ SOLN
4.0000 mg | Freq: Once | INTRAMUSCULAR | Status: AC
  Administered 2012-06-03: 4 mg via INTRAVENOUS
  Filled 2012-06-03: qty 2

## 2012-06-03 MED ORDER — SODIUM CHLORIDE 0.9 % IV SOLN
INTRAVENOUS | Status: DC
  Administered 2012-06-03: 23:00:00 via INTRAVENOUS

## 2012-06-03 MED ORDER — MORPHINE SULFATE 2 MG/ML IJ SOLN
2.0000 mg | Freq: Once | INTRAMUSCULAR | Status: AC
  Administered 2012-06-03: 2 mg via INTRAVENOUS
  Filled 2012-06-03: qty 1

## 2012-06-03 NOTE — ED Notes (Signed)
Pt c/o back pain that radiates into her chest, pt reports she has pancreatitis and often gets pains into her back from this but the pain has gotten worse then past couple of days and then 2 days ago the pain wrapped around into chest. Pt reports the pain comes straight through her back into mid-epigastric area and then straight up into mid chest towards esophagus. Pt denies eating spicy foods/hx of heartburn. Pt is worried that the pain is being caused d/t her smoking. Pt in nad, speaking in full sentences, laying in bed, resp e/u, skin warm and dry.

## 2012-06-03 NOTE — ED Notes (Addendum)
Pt up to the bathroom with no assistance needed.

## 2012-06-03 NOTE — ED Notes (Signed)
IV team at bedside 

## 2012-06-03 NOTE — ED Provider Notes (Signed)
History     CSN: [id number]  Arrival date & time 06/03/12  1745   First MD Initiated Contact with Patient 06/03/12 2039      Chief Complaint  Patient presents with  . Chest Pain  . Back Pain  . Shortness of Breath    (Consider location/radiation/quality/duration/timing/severity/associated sxs/prior treatment) The history is provided by the patient.   62 year old female followed by internal medicine outpatient clinics. With onset of upper back pain Wednesday night that would be last night. Today at noon developed the anterior chest pain radiating from the back 10 out of 10. It has been constant since then associated with shortness of breath no nausea no vomiting. Patient does not have a history of coronary artery disease however does have risk factors with diabetes and hypertension. The pain is described as sharp.  Past Medical History  Diagnosis Date  . Diabetes mellitus   . Hypertension   . ANEMIA, CHRONIC DISEASE NEC 03/15/2006  . DEGENERATIVE JOINT DISEASE 05/06/2007    On going for >5 yrs No imaging to confirm Has tried ultram, mobic, neurontin which did not work Microbiologist tried once worked well    . 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 DIABETIC  RETINOPATHY 03/15/2006  . Hyperlipidemia 04/01/2011  . PANCREATITIS, CHRONIC 03/29/2006  . RENAL INSUFFICIENCY, CHRONIC 03/15/2006    History reviewed. No pertinent past surgical history.  History reviewed. No pertinent family history.  History  Substance Use Topics  . Smoking status: Current Every Day Smoker -- 0.50 packs/day    Types: Cigarettes  . Smokeless tobacco: Not on file     Comment: Quit Line - wants to know if can get some patches  . Alcohol Use: Not on file    OB History   Grav Para Term Preterm Abortions TAB SAB Ect Mult Living                  Review of Systems  Constitutional: Negative for fever.  HENT: Negative for congestion and neck pain.   Eyes: Negative for visual disturbance.  Respiratory:  Positive for shortness of breath.   Cardiovascular: Positive for chest pain.  Gastrointestinal: Negative for nausea, vomiting and abdominal pain.  Genitourinary: Negative for dysuria.  Musculoskeletal: Negative for back pain.  Skin: Negative for rash.  Neurological: Negative for headaches.  Hematological: Does not bruise/bleed easily.  Psychiatric/Behavioral: Negative for confusion.    Allergies  Review of patient's allergies indicates no known allergies.  Home Medications   Current Outpatient Rx  Name  Route  Sig  Dispense  Refill  . albuterol (PROVENTIL HFA) 108 (90 BASE) MCG/ACT inhaler   Inhalation   Inhale 2 puffs into the lungs every 6 (six) hours as needed for wheezing.   6.7 g   1   . alendronate (FOSAMAX) 70 MG tablet   Oral   Take 70 mg by mouth every 7 (seven) days. Take with a full glass of water on an empty stomach. Takes on Tues.         . Calcium Carbonate-Vitamin D (CALCIUM-VITAMIN D) 500-200 MG-UNIT per tablet   Oral   Take 1 tablet by mouth daily with breakfast.         . gabapentin (NEURONTIN) 300 MG capsule   Oral   Take 1 capsule (300 mg total) by mouth 3 (three) times daily.   90 capsule   2   . insulin glargine (LANTUS) 100 UNIT/ML injection   Subcutaneous   Inject 25 Units into the skin  every morning.         Marland Kitchen losartan (COZAAR) 25 MG tablet               . Pancrelipase, Lip-Prot-Amyl, 24000 UNITS CPEP   Oral   Take 3 capsules by mouth 3 (three) times daily before meals. 3 capsules with meals and snacks         . polyethylene glycol (MIRALAX / GLYCOLAX) packet   Oral   Take 17 g by mouth daily as needed. For constipation   14 each   5   . travoprost, benzalkonium, (TRAVATAN) 0.004 % ophthalmic solution   Both Eyes   Place 1 drop into both eyes at bedtime.         . Blood Glucose Monitoring Suppl (ACCU-CHEK AVIVA PLUS) W/DEVICE KIT   Does not apply   1 Device by Does not apply route 3 (three) times daily.   1 kit   1    . glucose blood test strip      Use as instructed   100 each   12   . HYDROcodone-acetaminophen (NORCO/VICODIN) 5-325 MG per tablet   Oral   Take 1-2 tablets by mouth every 6 (six) hours as needed for pain.   14 tablet   0   . Insulin Pen Needle 31G X 8 MM MISC   Does not apply   1 each by Does not apply route once.   100 each   0   . Insulin Syringe-Needle U-100 (INSULIN SYRINGE .5CC/31GX5/16") 31G X 5/16" 0.5 ML MISC   Subcutaneous   Inject 1 each into the skin 3 (three) times daily.   100 each   11     BP 165/71  Pulse 60  Temp(Src) 97.7 F (36.5 C) (Oral)  Resp 19  SpO2 100%  Physical Exam  Nursing note and vitals reviewed. Constitutional: She is oriented to person, place, and time. She appears well-developed and well-nourished. No distress.  HENT:  Head: Normocephalic and atraumatic.  Mouth/Throat: Oropharynx is clear and moist.  Eyes: Conjunctivae and EOM are normal. Pupils are equal, round, and reactive to light.  Neck: Normal range of motion. Neck supple.  Cardiovascular: Normal rate, regular rhythm, normal heart sounds and intact distal pulses.   No murmur heard. Pulmonary/Chest: Effort normal and breath sounds normal. No respiratory distress. She has no wheezes. She has no rales. She exhibits no tenderness.  Abdominal: Soft. Bowel sounds are normal. There is no tenderness.  Musculoskeletal: Normal range of motion. She exhibits no edema and no tenderness.  Neurological: She is alert and oriented to person, place, and time. No cranial nerve deficit. She exhibits normal muscle tone. Coordination normal.  Skin: Skin is warm. No rash noted.    ED Course  Procedures (including critical care time)  Labs Reviewed  CBC - Abnormal; Notable for the following:    RBC 3.49 (*)    Hemoglobin 10.5 (*)    HCT 30.2 (*)    Platelets 141 (*)    All other components within normal limits  BASIC METABOLIC PANEL - Abnormal; Notable for the following:    Sodium 134  (*)    CO2 18 (*)    Glucose, Bld 171 (*)    BUN 47 (*)    Creatinine, Ser 1.76 (*)    GFR calc non Af Amer 30 (*)    GFR calc Af Amer 35 (*)    All other components within normal limits  D-DIMER, QUANTITATIVE - Abnormal; Notable  for the following:    D-Dimer, Quant 0.49 (*)    All other components within normal limits  POCT I-STAT TROPONIN I  POCT I-STAT TROPONIN I   Dg Chest 2 View  06/03/2012  *RADIOLOGY REPORT*  Clinical Data: Chest pain and back pain.  Shortness of breath. Dizziness.  CHEST - 2 VIEW  Comparison: 11/18/2011  Findings: Left lower lobe scarring is stable.  No evidence of acute infiltrate or edema.  No evidence of pleural effusion.  Heart size and mediastinal contours are normal.  IMPRESSION: Stable left lower lobe scarring.  No active disease.   Original Report Authenticated By: Myles Rosenthal, M.D.    Ct Angio Chest Pe W/cm &/or Wo Cm  06/03/2012  *RADIOLOGY REPORT*  Clinical Data: 62 year old female with back pain radiating to the chest.  History of pancreatitis.  Pain increasing.  CT ANGIOGRAPHY CHEST  Technique:  Multidetector CT imaging of the chest using the standard protocol during bolus administration of intravenous contrast. Multiplanar reconstructed images including MIPs were obtained and reviewed to evaluate the vascular anatomy.  Contrast: 80mL OMNIPAQUE IOHEXOL 350 MG/ML SOLN  Comparison: Chest CTA 07/28/2009.  Findings: Good contrast bolus timing in the pulmonary arterial tree.  No focal filling defect identified in the pulmonary arterial tree to suggest the presence of acute pulmonary embolism.  Chronic central pulmonary artery enlargement.  No pericardial effusion.  Stable visible aorta with predominately mild atherosclerosis.  There is evidence of left coronary artery territory calcified atherosclerosis.  Stable thoracic inlet.  No mediastinal lymphadenopathy.  No axillary lymphadenopathy.  Chronic calcific pancreatitis.  Stable visualized upper abdominal viscera.   Major airways are patent.  Chronic scarring and/or atelectasis at both lung bases appears stable.  Centrilobular emphysema.  Multiple small right lung subpleural nodules are stable since 2011 and can be considered benign (series 5 images 26, 38, 46, 49).  Degenerative changes in the thoracic spine. No acute osseous abnormality identified.  IMPRESSION: 1. No evidence of acute pulmonary embolus. Chronic enlargement of the central pulmonary arteries suggesting pulmonary hypertension. 2.  No acute pulmonary findings.  Emphysema with basilar scarring/atelectasis.  Multiple small and stable since 2011 benign/postinflammatory right lung nodules. 3.  Chronic calcific pancreatitis. 4.  Coronary artery atherosclerosis.   Original Report Authenticated By: Erskine Speed, M.D.     Results for orders placed during the hospital encounter of 06/03/12  CBC      Result Value Range   WBC 4.3  4.0 - 10.5 K/uL   RBC 3.49 (*) 3.87 - 5.11 MIL/uL   Hemoglobin 10.5 (*) 12.0 - 15.0 g/dL   HCT 16.1 (*) 09.6 - 04.5 %   MCV 86.5  78.0 - 100.0 fL   MCH 30.1  26.0 - 34.0 pg   MCHC 34.8  30.0 - 36.0 g/dL   RDW 40.9  81.1 - 91.4 %   Platelets 141 (*) 150 - 400 K/uL  BASIC METABOLIC PANEL      Result Value Range   Sodium 134 (*) 135 - 145 mEq/L   Potassium 4.5  3.5 - 5.1 mEq/L   Chloride 107  96 - 112 mEq/L   CO2 18 (*) 19 - 32 mEq/L   Glucose, Bld 171 (*) 70 - 99 mg/dL   BUN 47 (*) 6 - 23 mg/dL   Creatinine, Ser 7.82 (*) 0.50 - 1.10 mg/dL   Calcium 8.7  8.4 - 95.6 mg/dL   GFR calc non Af Amer 30 (*) >90 mL/min   GFR calc Af  Amer 35 (*) >90 mL/min  D-DIMER, QUANTITATIVE      Result Value Range   D-Dimer, Quant 0.49 (*) 0.00 - 0.48 ug/mL-FEU  POCT I-STAT TROPONIN I      Result Value Range   Troponin i, poc 0.00  0.00 - 0.08 ng/mL   Comment 3           POCT I-STAT TROPONIN I      Result Value Range   Troponin i, poc 0.00  0.00 - 0.08 ng/mL   Comment 3             Date: 06/03/2012  Rate: 75  Rhythm: normal sinus  rhythm  QRS Axis: normal  Intervals: normal  ST/T Wave abnormalities: normal  Conduction Disutrbances:none  Narrative Interpretation:   Old EKG Reviewed: unchanged EKG without significant changes compared to may the 23rd 2012.   1. Chest pain   2. Back pain       MDM  Patient with onset of upper back pain last night. Then today at around noon radiated to the anterior part of the chest 10 out of 10. Associated with shortness of breath no nausea no vomiting workup here in the emergency department originally the raise concern for dissection or pulmonary embolism CT angios negative. D-dimer was mildly elevated. Troponin at 9:30 this evening was negative. EKG without acute changes. Suspect that this is not cardiac related or anginal related because since been ongoing since last evening with expect her to troponin to be elevated. Do not feel this is a acute cardiac event. Patient has followup with outpatient clinics and she will make an appointment to followup with them.        Shelda Jakes, MD 06/04/12 [phone]

## 2012-06-03 NOTE — ED Notes (Signed)
Attempted to gain IV access X 2 RNs, unsuccessful. IV team paged.

## 2012-06-04 MED ORDER — HYDROCODONE-ACETAMINOPHEN 5-325 MG PO TABS
1.0000 | ORAL_TABLET | Freq: Once | ORAL | Status: AC
  Administered 2012-06-04: 1 via ORAL
  Filled 2012-06-04: qty 1

## 2012-06-04 MED ORDER — HYDROCODONE-ACETAMINOPHEN 5-325 MG PO TABS: 1.0000 | ORAL_TABLET | Freq: Four times a day (QID) | ORAL | Status: DC | PRN

## 2012-06-04 NOTE — ED Notes (Signed)
Per Dr. Saul Fordyce, pt can have 1 vicodin for pain.

## 2012-06-27 ENCOUNTER — Ambulatory Visit (INDEPENDENT_AMBULATORY_CARE_PROVIDER_SITE_OTHER): Payer: Medicaid Other | Admitting: Internal Medicine

## 2012-06-27 ENCOUNTER — Encounter: Payer: Self-pay | Admitting: Internal Medicine

## 2012-06-27 VITALS — BP 124/69 | HR 71 | Temp 97.9°F | Ht 66.0 in | Wt 185.8 lb

## 2012-06-27 DIAGNOSIS — M549 Dorsalgia, unspecified: Secondary | ICD-10-CM

## 2012-06-27 DIAGNOSIS — E119 Type 2 diabetes mellitus without complications: Secondary | ICD-10-CM

## 2012-06-27 LAB — GLUCOSE, CAPILLARY: Glucose-Capillary: 103 mg/dL — ABNORMAL HIGH (ref 70–99)

## 2012-06-27 MED ORDER — TRAMADOL HCL 50 MG PO TABS: 50.0000 mg | ORAL_TABLET | Freq: Three times a day (TID) | ORAL | Status: DC | PRN

## 2012-06-27 MED ORDER — INSULIN GLARGINE 100 UNIT/ML ~~LOC~~ SOLN: 28.0000 [IU] | Freq: Every morning | SUBCUTANEOUS | Status: DC

## 2012-06-27 NOTE — Progress Notes (Signed)
Internal Medicine Clinic Visit    HPI:  [name] is a 62 y.o. year old female a history of diabetes, hypertension, chronic pancreatitis, chronic back pain who presents to discuss back pain.  She's had back pain since 2010, insidious onset, getting worse. Pain is located in the middle and her lower back. She denies having any pain or any issues with her legs besides occasional tingling in her toes which is from her diabetes. Patient states that the pain prevents her from doing much activities at home and she has been laying on the couch most of the time. She denies any falls or trauma to the area.  Has tried gabapentin, which did not work, and she took more than the prescribed amounts and found that she had side effects of sleepiness. She states that Aleve and ibuprofen and Tylenol do not work for her, although she has not tried NSAIDs on a consistent basis. Has not tried physical therapy bc she can't get off the couch, refuses to even think about physical therapy, states that she "can't go to the gym because her back is hurting too bad". We discussed how physical therapy is different than exercise classes, but patient continued to interrupt the interviewer.    She received hydrocodone from ED 06/04/12 - she states that those are the only pills that worked for her pain. They worked so well, she took more than the prescribed amount, about 2 pills every 4 hours. These have now run out, and so she is seeking to get another prescription.   Denies bowel or bladder incontinence, no focal numbness,   Past Medical History  Diagnosis Date  . Diabetes mellitus   . Hypertension   . ANEMIA, CHRONIC DISEASE NEC 03/15/2006  . DEGENERATIVE JOINT DISEASE 05/06/2007    On going for >5 yrs No imaging to confirm Has tried ultram, mobic, neurontin which did not work Microbiologist tried once worked well    . 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 DIABETIC  RETINOPATHY 03/15/2006  . Hyperlipidemia 04/01/2011  .  PANCREATITIS, CHRONIC 03/29/2006  . RENAL INSUFFICIENCY, CHRONIC 03/15/2006    No past surgical history on file.   ROS:  A complete review of systems was otherwise negative, except as noted in the HPI.  Allergies: Review of patient's allergies indicates no known allergies.  Medications: Current Outpatient Prescriptions  Medication Sig Dispense Refill  . albuterol (PROVENTIL HFA) 108 (90 BASE) MCG/ACT inhaler Inhale 2 puffs into the lungs every 6 (six) hours as needed for wheezing.  6.7 g  1  . alendronate (FOSAMAX) 70 MG tablet Take 70 mg by mouth every 7 (seven) days. Take with a full glass of water on an empty stomach. Takes on Tues.      . Blood Glucose Monitoring Suppl (ACCU-CHEK AVIVA PLUS) W/DEVICE KIT 1 Device by Does not apply route 3 (three) times daily.  1 kit  1  . Calcium Carbonate-Vitamin D (CALCIUM-VITAMIN D) 500-200 MG-UNIT per tablet Take 1 tablet by mouth daily with breakfast.      . gabapentin (NEURONTIN) 300 MG capsule Take 1 capsule (300 mg total) by mouth 3 (three) times daily.  90 capsule  2  . glucose blood test strip Use as instructed  100 each  12  . HYDROcodone-acetaminophen (NORCO/VICODIN) 5-325 MG per tablet Take 1-2 tablets by mouth every 6 (six) hours as needed for pain.  14 tablet  0  . insulin glargine (LANTUS) 100 UNIT/ML injection Inject 25 Units into the skin every  morning.      . Insulin Pen Needle 31G X 8 MM MISC 1 each by Does not apply route once.  100 each  0  . Insulin Syringe-Needle U-100 (INSULIN SYRINGE .5CC/31GX5/16") 31G X 5/16" 0.5 ML MISC Inject 1 each into the skin 3 (three) times daily.  100 each  11  . losartan (COZAAR) 25 MG tablet       . Pancrelipase, Lip-Prot-Amyl, 24000 UNITS CPEP Take 3 capsules by mouth 3 (three) times daily before meals. 3 capsules with meals and snacks      . polyethylene glycol (MIRALAX / GLYCOLAX) packet Take 17 g by mouth daily as needed. For constipation  14 each  5  . travoprost, benzalkonium, (TRAVATAN) 0.004  % ophthalmic solution Place 1 drop into both eyes at bedtime.       No current facility-administered medications for this visit.    History   Social History  . Marital Status: Single    Spouse Name: N/A    Number of Children: N/A  . Years of Education: N/A   Occupational History  . Not on file.   Social History Main Topics  . Smoking status: Current Every Day Smoker -- 0.50 packs/day    Types: Cigarettes  . Smokeless tobacco: Not on file     Comment: Quit Line - wants to know if can get some patches  . Alcohol Use: Not on file  . Drug Use: Not on file  . Sexually Active: Not on file   Other Topics Concern  . Not on file   Social History Narrative  . No narrative on file    family history is not on file.  Physical Exam Blood pressure 124/69, pulse 71, temperature 97.9 F (36.6 C), temperature source Oral, weight 185 lb 12.8 oz (84.278 kg), SpO2 97.00%. General:  No acute distress, alert and oriented x 3, obese, well-appearing AAF HEENT:  PERRL, EOMI, no lymphadenopathy, moist mucous membranes Cardiovascular:  Regular rate and rhythm, no murmurs, rubs or gallops Respiratory:  Clear to auscultation bilaterally, no wheezes, rales, or rhonchi Abdomen:  Soft, nondistended, nontender, normoactive bowel sounds Extremities:  Warm and well-perfused, no clubbing, cyanosis, or edema.  MSK: strength 5/5 throughout, sensation to pin prick intact with exception of distal feet (chronic), no point tenderness along spine, no muscle spasms, no bony deformities Skin: Warm, dry, no rashes Neuro: Not anxious appearing, no depressed mood, normal affect  Labs: Lab Results  Component Value Date   CREATININE 1.76* 06/03/2012   BUN 47* 06/03/2012   NA 134* 06/03/2012   K 4.5 06/03/2012   CL 107 06/03/2012   CO2 18* 06/03/2012   Lab Results  Component Value Date   WBC 4.3 06/03/2012   HGB 10.5* 06/03/2012   HCT 30.2* 06/03/2012   MCV 86.5 06/03/2012   PLT 141* 06/03/2012      Assessment  and Plan:    FOLLOWUP: [name] will follow back up in our clinic in approximately 6 weeks. [name] knows to call our clinic in the meantime with any questions or new issues.   Plan was discussed with [name] Handing, attending physician.

## 2012-06-27 NOTE — Patient Instructions (Addendum)
General Instructions:  Please return to clinic in 6 weeks. Please go to physical therapy sessions.  Medication changes: started Ultram, increased lantus to 28 units in the morning.   Treatment Goals:  Goals (1 Years of Data) as of 06/27/12         As of Today 06/04/12 06/04/12 06/03/12 06/03/12     Blood Pressure    . Blood Pressure < 130/80  124/69 143/71 145/54 165/71 159/83    . Blood Pressure < 130/80  124/69 143/71 145/54 165/71 159/83     Lifestyle    . Quit smoking / using tobacco           Result Component    . HEMOGLOBIN A1C < 7.0  8.0        . LDL CALC < 100          . LDL CALC < 100            Progress Toward Treatment Goals:  Treatment Goal 06/27/2012  Hemoglobin A1C deteriorated  Blood pressure at goal    Self Care Goals & Plans:  Self Care Goal 06/27/2012  Manage my medications take my medicines as prescribed  Monitor my health keep track of my blood glucose; bring my glucose meter and log to each visit; check my feet daily  Eat healthy foods eat foods that are low in salt; eat baked foods instead of fried foods  Be physically active -  Stop smoking -    Home Blood Glucose Monitoring 06/27/2012  Check my blood sugar 2 times a day  When to check my blood sugar before breakfast; after dinner

## 2012-06-27 NOTE — Assessment & Plan Note (Addendum)
  Lab Results  Component Value Date   HGBA1C 8.0 06/27/2012   HGBA1C 7.8 03/28/2012   HGBA1C 7.5 12/02/2011     Assessment: Diabetes control: fair control Progress toward A1C goal:  deteriorated Comments: none  Plan: Medications:  see below Home glucose monitoring: Frequency: 2 times a day Timing: before breakfast;after dinner Instruction/counseling given: reminded to bring blood glucose meter & log to each visit and discussed the need for weight loss Educational resources provided: brochure Self management tools provided: home glucose logbook Other plans:   Increase lantus to 28, discussed diabetic diet and avoiding fried fatty, high carb foods Discussed the importance of checking fasting and post prandial BS so we can monitor her diabetes and make recommendations.

## 2012-06-28 NOTE — Assessment & Plan Note (Signed)
Patient has chronic isolated lumbar back pain. She does not have any pain that radiates to her legs or feet. Patient initially refused to go to physical therapy or try conservative management with ibuprofen. After further discussion, patient states that she will try to go to physical therapy. We also discussed starting Ultram with the understanding that she would keep all of her PT appointments. Patient did have some narcotic seeking behavior during this visit and expressed frustration with our clinic for not providing her with "what really works" for her pain (oxycodone). We discussed the data for chronic low back pain and I gave her my recommendations on therapy.  -Refer to physical therapy -Ultram -If patient does not go to her physical therapy appointments, it would show that she is not interested in working with [place] and trying our recommendations. Would not refill Ultram.

## 2012-07-04 NOTE — Progress Notes (Signed)
INTERNAL MEDICINE TEACHING ATTENDING ADDENDUM: I discussed this case with Dr. Kesty soon after the patient visit. I have read the documentation and I agree with the plan of care. Please see the resident note for details of management. 

## 2012-07-06 ENCOUNTER — Ambulatory Visit: Payer: Medicaid Other | Attending: Internal Medicine

## 2012-07-06 DIAGNOSIS — M545 Low back pain, unspecified: Secondary | ICD-10-CM | POA: Insufficient documentation

## 2012-07-06 DIAGNOSIS — IMO0001 Reserved for inherently not codable concepts without codable children: Secondary | ICD-10-CM | POA: Insufficient documentation

## 2012-07-06 DIAGNOSIS — R293 Abnormal posture: Secondary | ICD-10-CM | POA: Insufficient documentation

## 2012-07-06 DIAGNOSIS — M6281 Muscle weakness (generalized): Secondary | ICD-10-CM | POA: Insufficient documentation

## 2012-07-06 DIAGNOSIS — M79609 Pain in unspecified limb: Secondary | ICD-10-CM | POA: Insufficient documentation

## 2012-07-13 ENCOUNTER — Ambulatory Visit: Payer: Medicaid Other | Admitting: Physical Therapy

## 2012-07-20 ENCOUNTER — Ambulatory Visit: Payer: Medicaid Other | Admitting: Physical Therapy

## 2012-07-27 ENCOUNTER — Ambulatory Visit: Payer: Medicaid Other | Attending: Internal Medicine | Admitting: Physical Therapy

## 2012-07-27 DIAGNOSIS — IMO0001 Reserved for inherently not codable concepts without codable children: Secondary | ICD-10-CM | POA: Insufficient documentation

## 2012-07-27 DIAGNOSIS — M545 Low back pain, unspecified: Secondary | ICD-10-CM | POA: Insufficient documentation

## 2012-07-27 DIAGNOSIS — M6281 Muscle weakness (generalized): Secondary | ICD-10-CM | POA: Insufficient documentation

## 2012-07-27 DIAGNOSIS — R293 Abnormal posture: Secondary | ICD-10-CM | POA: Insufficient documentation

## 2012-07-27 DIAGNOSIS — M79609 Pain in unspecified limb: Secondary | ICD-10-CM | POA: Insufficient documentation

## 2012-07-28 ENCOUNTER — Other Ambulatory Visit: Payer: Self-pay | Admitting: *Deleted

## 2012-07-28 NOTE — Telephone Encounter (Signed)
Pt is out of meds

## 2012-07-29 ENCOUNTER — Other Ambulatory Visit: Payer: Self-pay | Admitting: Internal Medicine

## 2012-07-29 MED ORDER — PANCRELIPASE (LIP-PROT-AMYL) 24000-76000 UNITS PO CPEP: 3.0000 | ORAL_CAPSULE | Freq: Three times a day (TID) | ORAL | Status: DC

## 2012-07-29 MED ORDER — PANCRELIPASE (LIP-PROT-AMYL) 24000-76000 UNITS PO CPEP: ORAL_CAPSULE | ORAL | Status: DC

## 2012-08-04 ENCOUNTER — Encounter: Payer: Medicaid Other | Admitting: Internal Medicine

## 2012-08-11 ENCOUNTER — Other Ambulatory Visit: Payer: Self-pay | Admitting: *Deleted

## 2012-08-11 DIAGNOSIS — M549 Dorsalgia, unspecified: Secondary | ICD-10-CM

## 2012-08-16 NOTE — Telephone Encounter (Signed)
Pt has an appt 08/19/12.

## 2012-08-19 ENCOUNTER — Ambulatory Visit: Payer: Medicaid Other | Admitting: Internal Medicine

## 2012-08-22 ENCOUNTER — Other Ambulatory Visit: Payer: Self-pay | Admitting: *Deleted

## 2012-08-22 MED ORDER — ALBUTEROL SULFATE HFA 108 (90 BASE) MCG/ACT IN AERS: 2.0000 | INHALATION_SPRAY | Freq: Four times a day (QID) | RESPIRATORY_TRACT | Status: DC | PRN

## 2012-09-22 ENCOUNTER — Other Ambulatory Visit: Payer: Self-pay | Admitting: Internal Medicine

## 2012-09-28 ENCOUNTER — Encounter: Payer: Self-pay | Admitting: Internal Medicine

## 2012-09-28 ENCOUNTER — Ambulatory Visit (INDEPENDENT_AMBULATORY_CARE_PROVIDER_SITE_OTHER): Payer: Medicaid Other | Admitting: Internal Medicine

## 2012-09-28 ENCOUNTER — Ambulatory Visit: Payer: Medicaid Other | Admitting: Internal Medicine

## 2012-09-28 VITALS — BP 127/60 | HR 87 | Temp 98.1°F | Ht 65.0 in | Wt 186.4 lb

## 2012-09-28 DIAGNOSIS — E785 Hyperlipidemia, unspecified: Secondary | ICD-10-CM

## 2012-09-28 DIAGNOSIS — Z0000 Encounter for general adult medical examination without abnormal findings: Secondary | ICD-10-CM

## 2012-09-28 DIAGNOSIS — M549 Dorsalgia, unspecified: Secondary | ICD-10-CM

## 2012-09-28 DIAGNOSIS — M810 Age-related osteoporosis without current pathological fracture: Secondary | ICD-10-CM

## 2012-09-28 DIAGNOSIS — K861 Other chronic pancreatitis: Secondary | ICD-10-CM

## 2012-09-28 DIAGNOSIS — F17200 Nicotine dependence, unspecified, uncomplicated: Secondary | ICD-10-CM

## 2012-09-28 DIAGNOSIS — N189 Chronic kidney disease, unspecified: Secondary | ICD-10-CM

## 2012-09-28 DIAGNOSIS — E119 Type 2 diabetes mellitus without complications: Secondary | ICD-10-CM

## 2012-09-28 DIAGNOSIS — I10 Essential (primary) hypertension: Secondary | ICD-10-CM

## 2012-09-28 LAB — COMPLETE METABOLIC PANEL WITH GFR
ALT: 23 U/L (ref 0–35)
Albumin: 3.8 g/dL (ref 3.5–5.2)
CO2: 21 mEq/L (ref 19–32)
Calcium: 9.5 mg/dL (ref 8.4–10.5)
Chloride: 108 mEq/L (ref 96–112)
GFR, Est African American: 41 mL/min — ABNORMAL LOW
GFR, Est Non African American: 36 mL/min — ABNORMAL LOW
Glucose, Bld: 174 mg/dL — ABNORMAL HIGH (ref 70–99)
Potassium: 4.4 mEq/L (ref 3.5–5.3)
Sodium: 138 mEq/L (ref 135–145)
Total Protein: 7 g/dL (ref 6.0–8.3)

## 2012-09-28 LAB — GLUCOSE, CAPILLARY: Glucose-Capillary: 136 mg/dL — ABNORMAL HIGH (ref 70–99)

## 2012-09-28 LAB — LIPID PANEL
Cholesterol: 163 mg/dL (ref 0–200)
VLDL: 27 mg/dL (ref 0–40)

## 2012-09-28 MED ORDER — INSULIN GLARGINE 100 UNIT/ML ~~LOC~~ SOLN: 34.0000 [IU] | Freq: Every day | SUBCUTANEOUS | Status: DC

## 2012-09-28 MED ORDER — NICOTINE 7 MG/24HR TD PT24: 1.0000 | MEDICATED_PATCH | TRANSDERMAL | Status: DC

## 2012-09-28 MED ORDER — ALENDRONATE SODIUM 70 MG PO TABS: 70.0000 mg | ORAL_TABLET | ORAL | Status: DC

## 2012-09-28 MED ORDER — TRAMADOL HCL 50 MG PO TABS: 50.0000 mg | ORAL_TABLET | Freq: Every day | ORAL | Status: DC | PRN

## 2012-09-28 NOTE — Patient Instructions (Addendum)
Buy some COLACE when you pick up your other medicines; this is a stool softener that will hopefully help your constipation.  Follow-up with [facility] in 2 weeks, Dr. Aundria Rud in one month.  Please take your medicines exactly as prescribed and bring your glucometer with you to your next visit.  Keep working on exercising and eating healthy foods.   Diabetes Meal Planning Guide The diabetes meal planning guide is a tool to help you plan your meals and snacks. It is important for people with diabetes to manage their blood glucose (sugar) levels. Choosing the right foods and the right amounts throughout your day will help control your blood glucose. Eating right can even help you improve your blood pressure and reach or maintain a healthy weight. CARBOHYDRATE COUNTING MADE EASY When you eat carbohydrates, they turn to sugar. This raises your blood glucose level. Counting carbohydrates can help you control this level so you feel better. When you plan your meals by counting carbohydrates, you can have more flexibility in what you eat and balance your medicine with your food intake. Carbohydrate counting simply means adding up the total amount of carbohydrate grams in your meals and snacks. Try to eat about the same amount at each meal. Foods with carbohydrates are listed below. Each portion below is 1 carbohydrate serving or 15 grams of carbohydrates. Ask your dietician how many grams of carbohydrates you should eat at each meal or snack. Grains and Starches  1 slice bread.   English muffin or hotdog/hamburger bun.   cup cold cereal (unsweetened).   cup cooked pasta or rice.   cup starchy vegetables (corn, potatoes, peas, beans, winter squash).  1 tortilla (6 inches).   bagel.  1 waffle or pancake (size of a CD).   cup cooked cereal.  4 to 6 small crackers. *Whole grain is recommended. Fruit  1 cup fresh unsweetened berries, melon, papaya, pineapple.  1 small fresh fruit.    banana or mango.   cup fruit juice (4 oz unsweetened).   cup canned fruit in natural juice or water.  2 tbs dried fruit.  12 to 15 grapes or cherries. Milk and Yogurt  1 cup fat-free or 1% milk.  1 cup soy milk.  6 oz light yogurt with sugar-free sweetener.  6 oz low-fat soy yogurt.  6 oz plain yogurt. Vegetables  1 cup raw or  cup cooked is counted as 0 carbohydrates or a "free" food.  If you eat 3 or more servings at 1 meal, count them as 1 carbohydrate serving. Other Carbohydrates   oz chips or pretzels.   cup ice cream or frozen yogurt.   cup sherbet or sorbet.  2 inch square cake, no frosting.  1 tbs honey, sugar, jam, jelly, or syrup.  2 small cookies.  3 squares of graham crackers.  3 cups popcorn.  6 crackers.  1 cup broth-based soup.  Count 1 cup casserole or other mixed foods as 2 carbohydrate servings.  Foods with less than 20 calories in a serving may be counted as 0 carbohydrates or a "free" food. You may want to purchase a book or computer software that lists the carbohydrate gram counts of different foods. In addition, the nutrition facts panel on the labels of the foods you eat are a good source of this information. The label will tell you how big the serving size is and the total number of carbohydrate grams you will be eating per serving. Divide this number by 15 to  obtain the number of carbohydrate servings in a portion. Remember, 1 carbohydrate serving equals 15 grams of carbohydrate. SERVING SIZES Measuring foods and serving sizes helps you make sure you are getting the right amount of food. The list below tells how big or small some common serving sizes are.  1 oz.........4 stacked dice.  3 oz........Marland KitchenDeck of cards.  1 tsp.......Marland KitchenTip of little finger.  1 tbs......Marland KitchenMarland KitchenThumb.  2 tbs.......Marland KitchenGolf ball.   cup......Marland KitchenHalf of a fist.  1 cup.......Marland KitchenA fist. SAMPLE DIABETES MEAL PLAN Below is a sample meal plan that includes foods  from the grain and starches, dairy, vegetable, fruit, and meat groups. A dietician can individualize a meal plan to fit your calorie needs and tell you the number of servings needed from each food group. However, controlling the total amount of carbohydrates in your meal or snack is more important than making sure you include all of the food groups at every meal. You may interchange carbohydrate containing foods (dairy, starches, and fruits). The meal plan below is an example of a 2000 calorie diet using carbohydrate counting. This meal plan has 17 carbohydrate servings. Breakfast  1 cup oatmeal (2 carb servings).   cup light yogurt (1 carb serving).  1 cup blueberries (1 carb serving).   cup almonds. Snack  1 large apple (2 carb servings).  1 low-fat string cheese stick. Lunch  Chicken breast salad.  1 cup spinach.   cup chopped tomatoes.  2 oz chicken breast, sliced.  2 tbs low-fat [place] dressing.  12 whole-wheat crackers (2 carb servings).  12 to 15 grapes (1 carb serving).  1 cup low-fat milk (1 carb serving). Snack  1 cup carrots.   cup hummus (1 carb serving). Dinner  3 oz broiled salmon.  1 cup brown rice (3 carb servings). Snack  1  cups steamed broccoli (1 carb serving) drizzled with 1 tsp olive oil and lemon juice.  1 cup light pudding (2 carb servings). DIABETES MEAL PLANNING WORKSHEET Your dietician can use this worksheet to help you decide how many servings of foods and what types of foods are right for you.  BREAKFAST Food Group and Servings / Carb Servings Grain/Starches __________________________________ Dairy __________________________________________ Vegetable ______________________________________ Fruit ___________________________________________ Meat __________________________________________ Fat ____________________________________________ LUNCH Food Group and Servings / Carb Servings Grain/Starches  ___________________________________ Dairy ___________________________________________ Fruit ____________________________________________ Meat ___________________________________________ Fat _____________________________________________ Laural Golden Food Group and Servings / Carb Servings Grain/Starches ___________________________________ Dairy ___________________________________________ Fruit ____________________________________________ Meat ___________________________________________ Fat _____________________________________________ SNACKS Food Group and Servings / Carb Servings Grain/Starches ___________________________________ Dairy ___________________________________________ Vegetable _______________________________________ Fruit ____________________________________________ Meat ___________________________________________ Fat _____________________________________________ DAILY TOTALS Starches _________________________ Vegetable ________________________ Fruit ____________________________ Dairy ____________________________ Meat ____________________________ Fat ______________________________ Document Released: 11/06/2004 Document Revised: 05/04/2011 Document Reviewed: 09/17/2008 Exit[employer] Patient Information 2014 [employer].

## 2012-09-28 NOTE — Progress Notes (Signed)
Patient ID: Kinnie Scales, female   DOB: 07/10/1950, 62 y.o.   MRN: [id number]   Subjective:   Patient ID: KARN DERK female   DOB: 1950-07-11 62 y.o.   MRN: [id number]  HPI: Ms.Felisa C Mcelwee is a 62 y.o. 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    Pt states that her lower back is still hurting on a daily basis, although she has not had much pain today.  The pain has been ongoing since 2010 (likely due to mild multilevel degenerative disc disease and lumbar spondylosis, including 3 mm of anterolisthesis of L4 upon L5 seen on lumbar xray in 11/13).  Pain does not radiate down her leg although she does have some intermittent pain and tingling in her feet, likely secondary to diabetic peripheral neuropathy.  She has tried gabapentin and low dose NSAIDs in the past with minimal improvement.  She was given a short-term prescription for tramadol at her last visit in May; she states that 40 tablets lasted her a month as one tablet gave her complete relief usually for an entire day.  She was also referred to physical therapy at that time, states she went once but that it was a waste of time since she could do all of the exercises on her own at home. No recent falls, no trauma.    In terms of her diabetes, her last A1C was 8.0% in 5/13, 8.3% today.  Pt states that she checks her blood sugars about 3x daily, which have been running in the 180s-190s. Her lowest blood sugar was in the morning upon waking and was 106.  Pt was told to increase her Lantus dose from 25 to 28 units daily at her last visit but self-adjusted to 30 units daily.  She states that she knows she is not doing as well as she had been with her diabetes because she has been eating more, especially sweets.  She denies tremulousness, diaphoresis, AMS, blurry vision, polyuria, or polydipsia.   Pt is requesting  refills of her Lantus, Fosamax, and Ultram today.  Agreement was made last time that Ultram would only be refilled if pt followed up with physical therapy after her last visit, which she did do, though only once.  Pt states she is still smoking from time to time but only a couple of cigarettes a day, mainly out of boredom as she lives alone.  She is interested in quitting.  Pt is not drinking any alcohol or using illicit drugs.   Pt denies any abdominal pain, including pain similar to previous episodes of pancreatitis.  She states that she takes her Creon consistently before eating and feels that this has really helped her pain.  However, she does stay constipated and does not think Miralax helps her.  She has not tried using a stool softener in addition to the laxative.    Past Medical History  Diagnosis Date  . Diabetes mellitus   . Hypertension   . ANEMIA, CHRONIC DISEASE NEC 03/15/2006  . DEGENERATIVE JOINT DISEASE 05/06/2007    On going for >5 yrs No imaging to confirm Has tried ultram, mobic, neurontin which did not work Microbiologist tried once worked well    . 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 DIABETIC  RETINOPATHY 03/15/2006  . Hyperlipidemia 04/01/2011  . PANCREATITIS, CHRONIC 03/29/2006  . RENAL INSUFFICIENCY, CHRONIC 03/15/2006   Current  Outpatient Prescriptions  Medication Sig Dispense Refill  . albuterol (PROVENTIL HFA) 108 (90 BASE) MCG/ACT inhaler Inhale 2 puffs into the lungs every 6 (six) hours as needed for wheezing.  6.7 g  1  . alendronate (FOSAMAX) 70 MG tablet Take 1 tablet (70 mg total) by mouth every 7 (seven) days. Take with a full glass of water on an empty stomach. Takes on Tues.  4 tablet  2  . Blood Glucose Monitoring Suppl (ACCU-CHEK AVIVA PLUS) W/DEVICE KIT 1 Device by Does not apply route 3 (three) times daily.  1 kit  1  . glucose blood test strip Use as instructed  100 each  12  . insulin glargine (LANTUS) 100 UNIT/ML injection Inject 0.34 mLs (34 Units  total) into the skin at bedtime.  11 mL  3  . Insulin Pen Needle 31G X 8 MM MISC 1 each by Does not apply route once.  100 each  0  . Insulin Syringe-Needle U-100 (INSULIN SYRINGE .5CC/31GX5/16") 31G X 5/16" 0.5 ML MISC Inject 1 each into the skin 3 (three) times daily.  100 each  11  . losartan (COZAAR) 25 MG tablet       . Pancrelipase, Lip-Prot-Amyl, (CREON) 24000 UNITS CPEP TAKE 1-3 CAPSULES WITH MEALS & SNACKS   MAX OF 10caps/DAY  300 capsule  3  . polyethylene glycol (MIRALAX / GLYCOLAX) packet Take 17 g by mouth daily as needed. For constipation  14 each  5  . travoprost, benzalkonium, (TRAVATAN) 0.004 % ophthalmic solution Place 1 drop into both eyes at bedtime.      . Calcium Carbonate-Vitamin D (CALCIUM-VITAMIN D) 500-200 MG-UNIT per tablet Take 1 tablet by mouth daily with breakfast.      . nicotine (NICODERM CQ - DOSED IN MG/24 HR) 7 mg/24hr patch Place 1 patch onto the skin daily.  30 patch  0  . traMADol (ULTRAM) 50 MG tablet Take 1 tablet (50 mg total) by mouth daily as needed for pain.  30 tablet  0   No current facility-administered medications for this visit.   No family history on file. History   Social History  . Marital Status: Single    Spouse Name: N/A    Number of Children: N/A  . Years of Education: N/A   Social History Main Topics  . Smoking status: Current Every Day Smoker -- 0.50 packs/day    Types: Cigarettes  . Smokeless tobacco: None     Comment: [facility] - wants to know if can get some patches  . Alcohol Use: None  . Drug Use: None  . Sexually Active: None   Other Topics Concern  . None   Social History Narrative  . None   Review of Systems: Review of Systems  Constitutional: Negative for fever, chills, weight loss and malaise/fatigue.  HENT: Negative for congestion and neck pain.   Eyes: Negative for blurred vision and pain.  Respiratory: Negative for cough, sputum production, shortness of breath and wheezing.   Cardiovascular: Negative for  chest pain, palpitations and leg swelling.  Gastrointestinal: Positive for constipation. Negative for nausea, vomiting, abdominal pain and diarrhea.  Genitourinary: Negative for dysuria.  Musculoskeletal: Positive for back pain. Negative for falls.  Neurological: Negative for dizziness, tingling, focal weakness, weakness and headaches.    Objective:  Physical Exam: Filed Vitals:   09/28/12 1313  BP: 127/60  Pulse: 87  Temp: 98.1 F (36.7 C)  TempSrc: Oral  Height: 5\' 5"  (1.651 m)  Weight: 186 lb  6.4 oz (84.55 kg)  SpO2: 97%   General: alert, cooperative, and in no apparent distress HEENT: vision grossly intact, oropharynx clear and non-erythematous  Neck: supple, no lymphadenopathy, JVD, or carotid bruits Lungs: clear to ascultation bilaterally, normal work of respiration, no wheezes, rales, ronchi Heart: regular rate and rhythm, no murmurs, gallops, or rubs Abdomen: soft, non-tender, non-distended, normal bowel sounds Extremities: warm and well-perfused; no cyanosis, clubbing, or edema Neurologic: alert & oriented X3, strength 5/5 throughout  Assessment & Plan:  Patient reviewed with Dr. Meredith Pel.  Please see problem-based assessment and plan.

## 2012-09-29 DIAGNOSIS — Z0000 Encounter for general adult medical examination without abnormal findings: Secondary | ICD-10-CM | POA: Insufficient documentation

## 2012-09-29 NOTE — Assessment & Plan Note (Addendum)
Lab Results  Component Value Date   HGBA1C 8.3 09/28/2012   HGBA1C 8.0 06/27/2012   HGBA1C 7.8 03/28/2012     Assessment: Pt checks her blood sugars at home which have been running in the 180s-190s, lowest read she has had was 106 which was in the morning.  Pt had self-adjusted her Lantus to 30 units daily though BGs still too high.  Will consider adding glipizide at her next visit based on her next visit depending on BGs (metformin not an option given her chronic kidney disease).  Pt was encouraged to bring all of her medicines and her glucometer to her next visit so appropriate adjustments to her medication regimen can be made. Also discussed diabetic diet and emphasized importance of avoiding sugary foods and drinks.  Diabetes control: fair control Progress toward A1C goal:  deteriorated   Plan: Increase Lantus from 30 to 34 units daily, pt reminded about symptoms of hypoglycemia.  Will follow-up in one month to see how she is doing and to check glucometer. CMP today as well to monitor renal function.  Medications:  continue current medications with increase in dose of Lantus from 30 to 34 units daily; refilled today Home glucose monitoring: Frequency: 3 times a day Timing: before meals Instruction/counseling given: reminded to bring blood glucose meter & log to each visit Self management tools provided:  information on diabetic diet given Other plans: Pt will also follow-up with diabetes educator, Norm Parcel, in 1-2 weeks

## 2012-09-29 NOTE — Assessment & Plan Note (Signed)
Pt had normal colonoscopy in 2007, have printed record and requested to be scanned into record.   She should have a Pap smear at her next continuity visit with her PCP. We should also offer her a Zostavax at that time.

## 2012-09-29 NOTE — Assessment & Plan Note (Signed)
Pt takes her Creon enzymes before every meal and has not had any abdominal pain or n/v.

## 2012-09-29 NOTE — Assessment & Plan Note (Signed)
BP Readings from Last 3 Encounters:  09/28/12 127/60  06/27/12 124/69  06/04/12 143/71    Lab Results  Component Value Date   NA 138 09/28/2012   K 4.4 09/28/2012   CREATININE 1.57* 09/28/2012    Assessment: Blood pressure control: controlled Progress toward BP goal:  at goal  Plan: Medications:  continue current medications (losartan)

## 2012-09-29 NOTE — Progress Notes (Signed)
I saw and evaluated the patient.  I personally confirmed the key portions of the history and exam documented by Dr. Rogers and I reviewed pertinent patient test results.  The assessment, diagnosis, and plan were formulated together and I agree with the documentation in the resident's note. 

## 2012-09-29 NOTE — Assessment & Plan Note (Signed)
Assessment: Pt not currently on statin medication.   Plan: -check CMP, lipid panel today, will add statin if needed

## 2012-09-29 NOTE — Assessment & Plan Note (Addendum)
  Assessment: Pt states she does not need to smoke on a daily basis and only smoking one to a few cigarettes daily.  She is now interested in quitting.  Progress toward smoking cessation:  smoking the same amount Barriers to progress toward smoking cessation:  concern about weight gain  Plan: Pt was given prescription for patches that she will try in addition to self-motivation.   Instruction/counseling given:  I counseled patient on the dangers of tobacco use, advised patient to stop smoking, and reviewed strategies to maximize success. Educational resources provided:  [employer]) brochure Self management tools provided:  smoking cessation plan (STAR Quit Plan) Medications to assist with smoking cessation:  Nicotine Patch Patient agreed to the following self-care plans for smoking cessation: call [facility] (1-800-QUIT-NOW)

## 2012-09-29 NOTE — Assessment & Plan Note (Addendum)
Assessment: Pt has chronic lumbar back pain that she states is a daily issue for her.  Pain does not radiate to her legs or feet. Pt was given tramadol at her last visit in May, which gave her complete relief with only taking one tablet daily.  She also went to physical therapy once per her agreement with Dr. Collier Bullock.  She felt that she could complete the PT exercises on her own at home so did not return but states she continues to do the recommended exercises.  Also encouraged walking and swimming today.  Tramadol once daily is not an unreasonable option for controlling her pain, especially given her chronic kidney disease which makes NSAIDs undesirable. Patient has also tried gabapentin in the past with little relief; she was also taking more of this medication than prescribed which made her sleepy; this medication has thus been removed from her med list.   Plan: -follow-up in one month -rx given for tramadol once daily

## 2012-10-12 ENCOUNTER — Encounter: Payer: Self-pay | Admitting: Internal Medicine

## 2012-10-12 ENCOUNTER — Ambulatory Visit (INDEPENDENT_AMBULATORY_CARE_PROVIDER_SITE_OTHER): Payer: Medicaid Other | Admitting: Dietician

## 2012-10-12 ENCOUNTER — Ambulatory Visit (INDEPENDENT_AMBULATORY_CARE_PROVIDER_SITE_OTHER): Payer: Medicaid Other | Admitting: Internal Medicine

## 2012-10-12 VITALS — BP 129/76 | HR 75 | Temp 97.0°F | Ht 65.0 in | Wt 188.7 lb

## 2012-10-12 DIAGNOSIS — F17200 Nicotine dependence, unspecified, uncomplicated: Secondary | ICD-10-CM

## 2012-10-12 DIAGNOSIS — E119 Type 2 diabetes mellitus without complications: Secondary | ICD-10-CM

## 2012-10-12 DIAGNOSIS — M1990 Unspecified osteoarthritis, unspecified site: Secondary | ICD-10-CM

## 2012-10-12 DIAGNOSIS — I10 Essential (primary) hypertension: Secondary | ICD-10-CM

## 2012-10-12 DIAGNOSIS — M549 Dorsalgia, unspecified: Secondary | ICD-10-CM

## 2012-10-12 DIAGNOSIS — K219 Gastro-esophageal reflux disease without esophagitis: Secondary | ICD-10-CM

## 2012-10-12 LAB — GLUCOSE, CAPILLARY: Glucose-Capillary: 186 mg/dL — ABNORMAL HIGH (ref 70–99)

## 2012-10-12 MED ORDER — RANITIDINE HCL 150 MG PO TABS: 150.0000 mg | ORAL_TABLET | Freq: Every day | ORAL | Status: DC

## 2012-10-12 NOTE — Assessment & Plan Note (Signed)
BP well controlled today.  Continue current regimen (losartan).

## 2012-10-12 NOTE — Assessment & Plan Note (Signed)
Tobacco cessation encouraged.  Pt states she will start using patch this week.

## 2012-10-12 NOTE — Patient Instructions (Addendum)
Breakfast- 1/4 plate protein like egg, peanut butter, fish , meat, chicken, [place]                    1/4 plate of starch/fruit or sweet food  Lunch-   1/4 plate protein like egg, peanut butter, fish , meat, chicken, [place]     1/4 plate of starch/fruit or starchy vegetable or sweet food     1/2 plate of non starchy vegetables okra, tomatoes, greens, green beans, carrots, cabbage  Dinner - 1/4 plate protein like egg, peanut butter, fish , meat, chicken, [place]     1/4 plate of starch/fruit or starchy vegetable or sweet food                 1/2 plate of non starchy vegetables like okra, tomatoes, greens, green beans, carrots, cabbage  Frozen vegetables instead of canned vegetables Limit starches like corn, bread, desserts Eat fruit for snacks- 1/2 Banana, 15 grapes, or 1/2 cup 100% juice Ice cream- small cup of time a week   Eat more oil, peanut butter, tub margarine, lite mayonnaisse and avocados, - eat NO TRANS FATS (check your stick margarine) and less sausage   A small walk or bike ride after eating will help lower your blood sugar

## 2012-10-12 NOTE — Assessment & Plan Note (Signed)
Pt meeting with Norm Parcel today.  See last note from 2 weeks ago.  Continue current regimen.

## 2012-10-12 NOTE — Assessment & Plan Note (Signed)
Pain controlled on tramadol 50 mg daily.  Will likely continue this medication long-term for pain relief.

## 2012-10-12 NOTE — Progress Notes (Signed)
Patient ID: [name], female   DOB: 20-Feb-1951, 62 y.o.   MRN: [id number]   Subjective:   Patient ID: [name] female   DOB: 07-Aug-1950 62 y.o.   MRN: [id number]  HPI: [name] is a 62 y.o. 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   Pt was in clinic 2 weeks ago, we refilled tramadol 50 mg daily as pt feels that her chronic back pain is well controlled on this dose.  She had been taking this medication since 06/2012 without any trouble.  Pt states that she has taken Tramadol only once since her last visit because it caused 20-30 min of stomach upset after she took it for the first time after refilling.  She had just eaten her breakfast before taking it as she always does with her morning meds, did not eat anything different that day.  Denies n/v/d.  Her back pain was still relieved after taking Tramadol despite the upset stomach though she has had daily pain while not taking the medication.   In terms of her smoking, pt has not tried nicotine patch yet because the instructions told her she can not smoke while on the patch.  Therefore, pt states that she needs to know she is totally committed to quitting before starting the patch.  However, she intends to do so in the next week since she remains very interested in quitting.   Pt is meeting with diabetes educator today.  Her hypertension is well controlled today at 129/76.     Past Medical History  Diagnosis Date  . Diabetes mellitus   . Hypertension   . ANEMIA, CHRONIC DISEASE NEC 03/15/2006  . DEGENERATIVE JOINT DISEASE 05/06/2007    On going for >5 yrs No imaging to confirm Has tried ultram, mobic, neurontin which did not work Microbiologist tried once worked well    . 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 DIABETIC  RETINOPATHY 03/15/2006  . Hyperlipidemia 04/01/2011  . PANCREATITIS, CHRONIC 03/29/2006  . RENAL  INSUFFICIENCY, CHRONIC 03/15/2006   Current Outpatient Prescriptions  Medication Sig Dispense Refill  . albuterol (PROVENTIL HFA) 108 (90 BASE) MCG/ACT inhaler Inhale 2 puffs into the lungs every 6 (six) hours as needed for wheezing.  6.7 g  1  . alendronate (FOSAMAX) 70 MG tablet Take 1 tablet (70 mg total) by mouth every 7 (seven) days. Take with a full glass of water on an empty stomach. Takes on Tues.  4 tablet  2  . Blood Glucose Monitoring Suppl (ACCU-CHEK AVIVA PLUS) W/DEVICE KIT 1 Device by Does not apply route 3 (three) times daily.  1 kit  1  . glucose blood test strip Use as instructed  100 each  12  . insulin glargine (LANTUS) 100 UNIT/ML injection Inject 0.34 mLs (34 Units total) into the skin at bedtime.  11 mL  3  . Insulin Pen Needle 31G X 8 MM MISC 1 each by Does not apply route once.  100 each  0  . Insulin Syringe-Needle U-100 (INSULIN SYRINGE .5CC/31GX5/16") 31G X 5/16" 0.5 ML MISC Inject 1 each into the skin 3 (three) times daily.  100 each  11  . losartan (COZAAR) 25 MG tablet       . Pancrelipase, Lip-Prot-Amyl, (CREON) 24000 UNITS CPEP TAKE 1-3 CAPSULES WITH MEALS & SNACKS   MAX OF 10caps/DAY  300 capsule  3  . polyethylene  glycol (MIRALAX / GLYCOLAX) packet Take 17 g by mouth daily as needed. For constipation  14 each  5  . travoprost, benzalkonium, (TRAVATAN) 0.004 % ophthalmic solution Place 1 drop into both eyes at bedtime.      . Calcium Carbonate-Vitamin D (CALCIUM-VITAMIN D) 500-200 MG-UNIT per tablet Take 1 tablet by mouth daily with breakfast.      . nicotine (NICODERM CQ - DOSED IN MG/24 HR) 7 mg/24hr patch Place 1 patch onto the skin daily.  30 patch  0  . ranitidine (ZANTAC) 150 MG tablet Take 1 tablet (150 mg total) by mouth daily with breakfast.  60 tablet  1  . traMADol (ULTRAM) 50 MG tablet Take 1 tablet (50 mg total) by mouth daily as needed for pain.  30 tablet  0   No current facility-administered medications for this visit.   No family history on  file. History   Social History  . Marital Status: Single    Spouse Name: N/A    Number of Children: N/A  . Years of Education: N/A   Social History Main Topics  . Smoking status: Current Some Day Smoker -- 0.50 packs/day    Types: Cigarettes  . Smokeless tobacco: None     Comment: Quit Line - wants to know if can get some patches.SMOKES WHENEVER SHE GET THEM  . Alcohol Use: None  . Drug Use: None  . Sexual Activity: None   Other Topics Concern  . None   Social History Narrative  . None   Review of Systems: Review of Systems  Constitutional: Negative for fever, chills, malaise/fatigue and diaphoresis.  Eyes: Negative for blurred vision.  Respiratory: Negative for cough and shortness of breath.   Cardiovascular: Negative for chest pain, palpitations and leg swelling.  Gastrointestinal: Negative for nausea, vomiting, abdominal pain, diarrhea and blood in stool.  Genitourinary: Negative for dysuria.  Musculoskeletal: Positive for back pain. Negative for falls.  Neurological: Negative for dizziness, tingling, loss of consciousness, weakness and headaches.    Objective:  Physical Exam: Filed Vitals:   10/12/12 0930  BP: 129/76  Pulse: 75  Temp: 97 F (36.1 C)  TempSrc: Oral  Height: 5\' 5"  (1.651 m)  Weight: 188 lb 11.2 oz (85.594 kg)  SpO2: 100%   General: alert, cooperative, and in no apparent distress HEENT: vision grossly intact, oropharynx clear and non-erythematous  Neck: supple, no lymphadenopathy, JVD, or carotid bruits Lungs: clear to ascultation bilaterally, normal work of respiration, no wheezes, rales, ronchi Heart: regular rate and rhythm, no murmurs, gallops, or rubs Abdomen: soft, non-tender, non-distended, normal bowel sounds Extremities: no cyanosis, clubbing, or edema Neurologic: alert & oriented X3, cranial nerves II-XII intact, strength grossly intact, sensation intact to light touch  Assessment & Plan:  Patient discussed with Dr. Criselda Peaches.    Please see problem-based assessment and plan.

## 2012-10-12 NOTE — Patient Instructions (Addendum)
Please follow-up with your PCP Dr. Dierdre Searles on 9/4 at 2:45 PM.   We prescribed you a medicine to help settle your stomach as well as help with heartburn   Please take it once daily with breakfast before you take your tramadol for back pain.  Let us know if you have any more problems.     Ranitidine tablets or capsules What is this medicine? RANITIDINE (ra NYE te deen) is a type of antihistamine that blocks the release of stomach acid. It is used to treat stomach or intestinal ulcers. It can relieve ulcer pain and discomfort, and the heartburn from acid reflux. This medicine may be used for other purposes; ask your health care provider or pharmacist if you have questions. What should I tell my health care provider before I take this medicine? They need to know if you have any of these conditions: -kidney disease -liver disease -porphyria -an unusual or allergic reaction to ranitidine, other medicines, foods, dyes, or preservatives -pregnant or trying to get pregnant -breast-feeding How should I use this medicine? Take this medicine by mouth with a glass of water. Follow the directions on the prescription label. If you only take this medicine once a day, take it at bedtime. Take your medicine at regular intervals. Do not take your medicine more often than directed. Do not stop taking except on your doctor's advice. Talk to your pediatrician regarding the use of this medicine in children. Special care may be needed. Overdosage: If you think you have taken too much of this medicine contact a poison control center or emergency room at once. NOTE: This medicine is only for you. Do not share this medicine with others. What if I miss a dose? If you miss a dose, take it as soon as you can. If it is almost time for your next dose, take only that dose. Do not take double or extra doses. What may interact with this  medicine? -atazanavir -delavirdine -gefitinib -glipizide -ketoconazole -midazolam -procainamide -propantheline -triazolam -warfarin This list may not describe all possible interactions. Give your health care provider a list of all the medicines, herbs, non-prescription drugs, or dietary supplements you use. Also tell them if you smoke, drink alcohol, or use illegal drugs. Some items may interact with your medicine. What should I watch for while using this medicine? Tell your doctor or health care professional if your condition does not start to get better or gets worse. You may need to take this medicine for several days as prescribed before your symptoms get better. Finish the full course of tablets prescribed, even if you feel better. Do not smoke cigarettes or drink alcohol. These increase irritation in your stomach and can lengthen the time it will take for ulcers to heal. Cigarettes and alcohol can also make acid reflux or heartburn worse. If you need to take an antacid you should take it at least 1 hour before or 1 hour after this medicine. This medicine will not be as effective if taken at the same time as an antacid. If you get black, tarry stools or vomit up what looks like coffee grounds, call your doctor or health care professional at once. You may have a bleeding ulcer. What side effects may I notice from receiving this medicine? Side effects that you should report to your doctor or health care professional as soon as possible: -agitation, nervousness, depression, hallucinations -allergic reactions like skin rash, itching or hives, swelling of the face, lips, or tongue -breast enlargement in both  males and females -breathing problems -redness, blistering, peeling or loosening of the skin, including inside the mouth -unusual bleeding or bruising -unusually weak or tired -vomiting -yellowing of the skin or eyes Side effects that usually do not require medical attention (report  to your doctor or health care professional if they continue or are bothersome): -constipation or diarrhea -dizziness -headache -nausea This list may not describe all possible side effects. Call your doctor for medical advice about side effects. You may report side effects to FDA at 1-800-FDA-1088. Where should I keep my medicine? Keep out of the reach of children. Store at room temperature between 15 and 30 degrees C (59 and 86 degrees F). Protect from light and moisture. Keep container tightly closed. Throw away any unused medicine after the expiration date. NOTE: This sheet is a summary. It may not cover all possible information. If you have questions about this medicine, talk to your doctor, pharmacist, or health care provider.  2013, [employer]. (09/20/2007 5:13:09 PM)

## 2012-10-12 NOTE — Assessment & Plan Note (Signed)
Zantac rx sent to pharmacy to help with heartburn after eating/stomach upset with morning meds.

## 2012-10-12 NOTE — Assessment & Plan Note (Deleted)
Pain controlled on tramadol 50 mg daily.

## 2012-10-12 NOTE — Progress Notes (Signed)
Diabetes Self-Management Education  Visit Number: First/Initial  10/12/2012 Ms. [name], identified by name and date of birth, is a 62 y.o. female with Diabetes Type: Type 2.   ASSESSMENT  Patient Concerns:  Glycemic Control   weight increased 35# in past 4 years, 6# in past 1 year,  BMI increased from high normal weight to current obese status  Lab Results: LDL Cholesterol  Date Value Range Status  09/28/2012 69  0 - 99 mg/dL Final           Hemoglobin A1C  Date Value Range Status  09/28/2012 8.3   Final  01/09/2010 7.4   Final     No family history on file. History  Substance Use Topics  . Smoking status: Current Some Day Smoker -- 0.50 packs/day    Types: Cigarettes  . Smokeless tobacco: Not on file     Comment: Quit Line - wants to know if can get some patches.SMOKES WHENEVER SHE GET THEM  . Alcohol Use: Not on file    Support Systems:  Friends Special Needs:    simple instructions Prior DM Education:  yes- in hospital Daily Foot Exams:  goes to Dr. Leeanne Deed Patient Belief / Attitude about Diabetes:  Diabetes can be controlled  Assessment comments: patient desires A1C to be lower and to decrease her weight.    Diet Recall: Breakfast- egg, toast, sausage, lunch bologna or peanut butter sandwich or skips, dinner- pig tail ot stewed chicken, cabbage/vegetables, snacks- baked goods,  ice cream, watermelon    Individualized Plan for Diabetes Self-Management Training:  Patient individualized diabetes plan discussed today with patient and includes: Nutrition, medications, monitoring, physical activity, how to handle highs and lows,   Education Topics Reviewed with Patient Today:  Topic Points Discussed  Disease State    Nutrition Management Role of diet in the treatment of diabetes and the relationship between the three main macronutrients and blood glucose level;Food label reading, portion sizes and measuring food.;Reviewed blood glucose goals for pre and post  meals and how to evaluate the patients' food intake on their blood glucose level.  Physical Activity and Exercise Role of exercise on diabetes management, blood pressure control and cardiac health.  Medications    Monitoring Purpose and frequency of SMBG.  Acute Complications    Chronic Complications    Psychosocial Adjustment    Goal Setting Helped patient develop diabetes management plan for improving her A1C per her request  Preconception Care (if applicable)      PATIENTS GOALS   Learning Objective(s):  state portion sizes of favorite foods  Goal The patient agrees to:  Nutrition Follow meal plan discussed  Physical Activity Exercise 3-5 times per week  Medications    Monitoring    Problem Solving    Reducing Risk    Health Coping     Patient Self-Evaluation of Goals (Subsequent Visits)  Goal The patient rates self as meeting goals (% of time)  Nutrition    Physical Activity    Medications    Monitoring    Problem Solving    Reducing Risk    Health Coping       PERSONALIZED PLAN / SUPPORT  Self-Management Support:  Doctor's office;Friends;Family;CDE visits ______________________________________________________________________  Outcomes  Expected Outcomes:  Demonstrated interest in learning. Expect positive outcomes Self-Care Barriers:  Lack of transportation;Lack of material resources Education material provided: yes If problems or questions, patient to contact team via:  Phone Time in: 1000     Time out: 1100  Future DSME appointment: - Other (comment)   Plyler, Lupita Leash 10/12/2012 11:27 AM

## 2012-10-27 ENCOUNTER — Encounter: Payer: Medicaid Other | Admitting: Internal Medicine

## 2012-10-27 ENCOUNTER — Ambulatory Visit: Payer: Medicaid Other | Admitting: Dietician

## 2012-10-27 ENCOUNTER — Encounter: Payer: Self-pay | Admitting: Internal Medicine

## 2012-11-22 ENCOUNTER — Other Ambulatory Visit: Payer: Self-pay | Admitting: *Deleted

## 2012-11-22 DIAGNOSIS — E119 Type 2 diabetes mellitus without complications: Secondary | ICD-10-CM

## 2012-11-22 DIAGNOSIS — N259 Disorder resulting from impaired renal tubular function, unspecified: Secondary | ICD-10-CM

## 2012-11-22 DIAGNOSIS — R232 Flushing: Secondary | ICD-10-CM

## 2012-11-22 DIAGNOSIS — D638 Anemia in other chronic diseases classified elsewhere: Secondary | ICD-10-CM

## 2012-11-22 DIAGNOSIS — I10 Essential (primary) hypertension: Secondary | ICD-10-CM

## 2012-11-22 DIAGNOSIS — M1990 Unspecified osteoarthritis, unspecified site: Secondary | ICD-10-CM

## 2012-11-23 MED ORDER — PANCRELIPASE (LIP-PROT-AMYL) 24000-76000 UNITS PO CPEP: ORAL_CAPSULE | ORAL | Status: DC

## 2012-11-23 MED ORDER — POLYETHYLENE GLYCOL 3350 17 G PO PACK: 17.0000 g | PACK | Freq: Every day | ORAL | Status: DC | PRN

## 2012-11-23 MED ORDER — ALBUTEROL SULFATE HFA 108 (90 BASE) MCG/ACT IN AERS: 2.0000 | INHALATION_SPRAY | Freq: Four times a day (QID) | RESPIRATORY_TRACT | Status: DC | PRN

## 2012-12-07 ENCOUNTER — Ambulatory Visit (INDEPENDENT_AMBULATORY_CARE_PROVIDER_SITE_OTHER): Payer: Medicaid Other | Admitting: Podiatry

## 2012-12-07 ENCOUNTER — Encounter: Payer: Self-pay | Admitting: Podiatry

## 2012-12-07 VITALS — BP 146/74 | HR 65 | Temp 98.5°F | Resp 16 | Ht 65.0 in | Wt 184.0 lb

## 2012-12-07 DIAGNOSIS — L97509 Non-pressure chronic ulcer of other part of unspecified foot with unspecified severity: Secondary | ICD-10-CM

## 2012-12-07 NOTE — Progress Notes (Signed)
Patient ID: [name], female   DOB: Oct 20, 1950, 62 y.o.   MRN: [id number] This patient presents for ongoing care of for prediabetic and diabetic foot ulcers.  Examination: Hemorrhagic hyperkeratoses noted on the plantar aspect of the first and fifth right metatarsal phalangeal joints. 9 hemorrhagic keratosis noted plantar left first MPJ.  Assessment: Pre-ulcerative hyperkeratotic lesions bilaterally.  Plan: The pre-ulcerative in hyperkeratotic lesions were debrided back with today. Patient will ambulate in diabetic shoes with Plastizote insoles. To prevent significant ulceration based on past history patient will return at 3 week intervals for debridement sooner if she has concerned.  Flem Enderle C.Leeanne Deed, DPM

## 2012-12-07 NOTE — Patient Instructions (Signed)
Wear diabetic shoes all the time

## 2012-12-15 ENCOUNTER — Encounter: Payer: Self-pay | Admitting: Internal Medicine

## 2012-12-15 ENCOUNTER — Other Ambulatory Visit: Payer: Self-pay | Admitting: *Deleted

## 2012-12-15 DIAGNOSIS — M810 Age-related osteoporosis without current pathological fracture: Secondary | ICD-10-CM

## 2012-12-15 MED ORDER — ALENDRONATE SODIUM 70 MG PO TABS: 70.0000 mg | ORAL_TABLET | ORAL | Status: DC

## 2012-12-28 ENCOUNTER — Ambulatory Visit: Payer: Medicaid Other | Admitting: Podiatry

## 2013-01-16 ENCOUNTER — Encounter: Payer: Self-pay | Admitting: Podiatry

## 2013-01-16 ENCOUNTER — Ambulatory Visit (INDEPENDENT_AMBULATORY_CARE_PROVIDER_SITE_OTHER): Payer: Medicaid Other | Admitting: Podiatry

## 2013-01-16 VITALS — BP 117/57 | HR 85 | Temp 98.2°F | Resp 24 | Ht 65.0 in | Wt 184.0 lb

## 2013-01-16 DIAGNOSIS — B351 Tinea unguium: Secondary | ICD-10-CM

## 2013-01-16 DIAGNOSIS — M79609 Pain in unspecified limb: Secondary | ICD-10-CM

## 2013-01-16 NOTE — Progress Notes (Signed)
Patient ID: [name], female   DOB: 12-21-1950, 62 y.o.   MRN: [id number] Subjective: Patient presents for evaluation of pre-ulcerative hyperkeratotic lesions right and left feet as well as complaining of painful mycotic toenails.  Objective: Hemorrhagic keratoses plantar first and fifth MPJ right and left first MPJ. These keratoses remain closed after debridement. All 10 toenails are incurvated, hypertrophic, discolored and tender to pressure.  Assessment: Pre-ulcerative hyperkeratotic lesions x2 left x1 right Symptomatic onychomycoses x10  Plan: The hyperkeratotic lesions are debrided back without any bleeding. All 10 toenails are debrided back without any bleeding. Patient will return at approximately 4 week interval for evaluation and treatment of pre-ulcerative are ulcerative areas bilaterally. She has a long history of recurrent plantar ulcers which require repetitive debridement to maintain significant breakdown.

## 2013-01-25 ENCOUNTER — Other Ambulatory Visit: Payer: Self-pay | Admitting: *Deleted

## 2013-01-25 DIAGNOSIS — E119 Type 2 diabetes mellitus without complications: Secondary | ICD-10-CM

## 2013-01-25 MED ORDER — GLUCOSE BLOOD VI STRP: ORAL_STRIP | Status: DC

## 2013-01-26 ENCOUNTER — Encounter: Payer: Medicaid Other | Admitting: Internal Medicine

## 2013-01-26 MED ORDER — "INSULIN SYRINGE 31G X 5/16"" 0.5 ML MISC": 1.0000 | Freq: Three times a day (TID) | Status: DC

## 2013-02-01 ENCOUNTER — Other Ambulatory Visit: Payer: Self-pay | Admitting: Internal Medicine

## 2013-02-13 ENCOUNTER — Ambulatory Visit: Payer: Medicaid Other | Admitting: Podiatry

## 2013-02-21 ENCOUNTER — Other Ambulatory Visit: Payer: Self-pay | Admitting: *Deleted

## 2013-02-21 DIAGNOSIS — M810 Age-related osteoporosis without current pathological fracture: Secondary | ICD-10-CM

## 2013-02-21 NOTE — Telephone Encounter (Signed)
Pt was called and explained meds will not be refilled until she sees her PCP per Dr Dierdre Searles.  Pt responded "Thank You" then she hunged up the telephone. Dr Dierdre Searles had stated she has seen this pt and has missed several appts w/her.

## 2013-02-21 NOTE — Telephone Encounter (Signed)
As her PCP, I have never met her since I became her PCP in June 2014. Patient had "no shows" to the five of my clinic appointments since March this year.  I have been giving her home meds refills with an understanding that she will come to her appts with me. I asked the triage nurse to specifically tell her that I would not give her refills if she does not come to her appts with me.  However, she continues to have no show to her appts. Last no-show appt was on 01/26/13.   Patient will need to be more compliant with her OV appts at this point. Please send her a letter to ask her to set up an appt with me.   Thanks  Dr. Dierdre Searles

## 2013-02-22 ENCOUNTER — Ambulatory Visit (INDEPENDENT_AMBULATORY_CARE_PROVIDER_SITE_OTHER): Payer: Medicaid Other | Admitting: Podiatry

## 2013-02-22 ENCOUNTER — Encounter: Payer: Self-pay | Admitting: Podiatry

## 2013-02-22 VITALS — BP 120/61 | HR 71 | Temp 97.7°F

## 2013-02-22 DIAGNOSIS — L97509 Non-pressure chronic ulcer of other part of unspecified foot with unspecified severity: Secondary | ICD-10-CM

## 2013-02-22 NOTE — Progress Notes (Signed)
Patient ID: [name], female   DOB: 1950/05/16, 62 y.o.   MRN: [id number]  Subjective: This known diabetic 61 year old black female presents for for ongoing local wound care for pre-ulcerative and ulcerative diabetic skin ulcers on the plantar aspect the right and left feet. The intervals between visits very between 2-4 weeks.  Objective: After debridement of hyperkeratotic tissue on the plantar aspect of right fifth MPJ a superficial ulcer approximately 10 mm in diameter is noted. The plantar aspect of the right first MPJ and the plantar aspect the left first MPJ have hyperkeratotic tissue that does not breakdown and ulceration after debridement. In addition, there is a porokeratoses on the plantar left heel.  Assessment: Superficial ulceration plantar aspect of the fifth right MPJ Pre-ulcerative hyperkeratotic lesion plantar aspect of right first MPJ and plantar aspect of left first MPJ.  Plan: Ulcerative hyperkeratotic areas are debrided back. Patient will apply Silvadene to the skin ulcer on the plantar fifth right MPJ daily until healed. She'll ambulate in diabetic shoes with Plastizote insoles. Reappoint in 3 weeks.

## 2013-02-22 NOTE — Patient Instructions (Signed)
Apply Silvadene cream to the skin ulcer on the base of the fifth right toe daily until healed.

## 2013-03-15 ENCOUNTER — Ambulatory Visit: Payer: Medicaid Other | Admitting: Podiatry

## 2013-03-29 ENCOUNTER — Ambulatory Visit (INDEPENDENT_AMBULATORY_CARE_PROVIDER_SITE_OTHER): Payer: Medicaid Other | Admitting: Podiatry

## 2013-03-29 ENCOUNTER — Encounter: Payer: Self-pay | Admitting: Podiatry

## 2013-03-29 VITALS — BP 142/69 | HR 67 | Resp 12

## 2013-03-29 DIAGNOSIS — L97509 Non-pressure chronic ulcer of other part of unspecified foot with unspecified severity: Secondary | ICD-10-CM

## 2013-03-29 NOTE — Patient Instructions (Signed)
Apply Silvadene cream to the skin ulcers on the bottom the right foot, cover with gauze. Wear the surgical shoe on the right foot. Reappoint x2 weeks

## 2013-03-29 NOTE — Progress Notes (Signed)
Patient ID: [name], female   DOB: August 12, 1950, 63 y.o.   MRN: [id number]  Subjective: This 63 year old black female diabetic presents for ongoing care for diabetic skin ulcers and intervals very between 62-4 weeks. The last visit for this service was 02/22/2013. Patient states her hemoglobin A1c is in the 8  Range  Objective: The plantar right foot demonstrates hemorrhagic hyperkeratoses plantar first and fifth MPJ at after debridement breakdown into a 5 mm superficial skin ulcer fifth right MPJ, and a 3 mm superficial ulcer plantar left first MPJ.  Left first MPJ has hyperkeratotic tissue without a hemorrhagic debris.  Assessment: Superficial ulcerations plantar first and fifth MPJ right, without any clinical sign of infection Pre-ulcerative hyperkeratotic lesion plantar left first MPJ  Plan: The ulcerative hyperkeratotic tissue debrided back. Patient will apply Silvadene to the ulcers in the first and fifth MPJ right daily, cover with gauze. Shoe wear a surgical shoe on the right foot. Shoe wear her diabetic shoe with a custom insole on the left foot.  Reappoint x2 weeks

## 2013-04-10 ENCOUNTER — Encounter: Payer: Self-pay | Admitting: Internal Medicine

## 2013-04-10 ENCOUNTER — Ambulatory Visit (INDEPENDENT_AMBULATORY_CARE_PROVIDER_SITE_OTHER): Payer: Medicaid Other | Admitting: Internal Medicine

## 2013-04-10 VITALS — BP 119/62 | HR 75 | Temp 98.4°F | Ht 65.0 in | Wt 186.8 lb

## 2013-04-10 DIAGNOSIS — N189 Chronic kidney disease, unspecified: Secondary | ICD-10-CM

## 2013-04-10 DIAGNOSIS — Z23 Encounter for immunization: Secondary | ICD-10-CM

## 2013-04-10 DIAGNOSIS — R232 Flushing: Secondary | ICD-10-CM

## 2013-04-10 DIAGNOSIS — M810 Age-related osteoporosis without current pathological fracture: Secondary | ICD-10-CM

## 2013-04-10 DIAGNOSIS — N183 Chronic kidney disease, stage 3 unspecified: Secondary | ICD-10-CM

## 2013-04-10 DIAGNOSIS — M47817 Spondylosis without myelopathy or radiculopathy, lumbosacral region: Secondary | ICD-10-CM

## 2013-04-10 DIAGNOSIS — N259 Disorder resulting from impaired renal tubular function, unspecified: Secondary | ICD-10-CM

## 2013-04-10 DIAGNOSIS — M1990 Unspecified osteoarthritis, unspecified site: Secondary | ICD-10-CM

## 2013-04-10 DIAGNOSIS — I10 Essential (primary) hypertension: Secondary | ICD-10-CM

## 2013-04-10 DIAGNOSIS — K219 Gastro-esophageal reflux disease without esophagitis: Secondary | ICD-10-CM

## 2013-04-10 DIAGNOSIS — I129 Hypertensive chronic kidney disease with stage 1 through stage 4 chronic kidney disease, or unspecified chronic kidney disease: Secondary | ICD-10-CM

## 2013-04-10 DIAGNOSIS — E1139 Type 2 diabetes mellitus with other diabetic ophthalmic complication: Secondary | ICD-10-CM

## 2013-04-10 DIAGNOSIS — D638 Anemia in other chronic diseases classified elsewhere: Secondary | ICD-10-CM

## 2013-04-10 DIAGNOSIS — E119 Type 2 diabetes mellitus without complications: Secondary | ICD-10-CM

## 2013-04-10 DIAGNOSIS — F17200 Nicotine dependence, unspecified, uncomplicated: Secondary | ICD-10-CM

## 2013-04-10 LAB — POCT GLYCOSYLATED HEMOGLOBIN (HGB A1C): Hemoglobin A1C: 7.9

## 2013-04-10 LAB — BASIC METABOLIC PANEL WITH GFR
BUN: 40 mg/dL — AB (ref 6–23)
CALCIUM: 9.5 mg/dL (ref 8.4–10.5)
CO2: 21 meq/L (ref 19–32)
Chloride: 108 mEq/L (ref 96–112)
Creat: 1.22 mg/dL — ABNORMAL HIGH (ref 0.50–1.10)
GFR, EST AFRICAN AMERICAN: 55 mL/min — AB
GFR, Est Non African American: 48 mL/min — ABNORMAL LOW
GLUCOSE: 159 mg/dL — AB (ref 70–99)
Potassium: 4.3 mEq/L (ref 3.5–5.3)
Sodium: 138 mEq/L (ref 135–145)

## 2013-04-10 LAB — GLUCOSE, CAPILLARY: Glucose-Capillary: 152 mg/dL — ABNORMAL HIGH (ref 70–99)

## 2013-04-10 MED ORDER — ALENDRONATE SODIUM 70 MG PO TABS: 70.0000 mg | ORAL_TABLET | ORAL | Status: DC

## 2013-04-10 MED ORDER — CALCIUM-VITAMIN D 500-200 MG-UNIT PO TABS: 1.0000 | ORAL_TABLET | Freq: Two times a day (BID) | ORAL | Status: DC

## 2013-04-10 MED ORDER — LOSARTAN POTASSIUM 25 MG PO TABS: 25.0000 mg | ORAL_TABLET | Freq: Every day | ORAL | Status: DC

## 2013-04-10 MED ORDER — ALBUTEROL SULFATE HFA 108 (90 BASE) MCG/ACT IN AERS: 2.0000 | INHALATION_SPRAY | Freq: Four times a day (QID) | RESPIRATORY_TRACT | Status: DC | PRN

## 2013-04-10 MED ORDER — POLYETHYLENE GLYCOL 3350 17 G PO PACK: 17.0000 g | PACK | Freq: Every day | ORAL | Status: DC | PRN

## 2013-04-10 MED ORDER — RANITIDINE HCL 150 MG PO TABS: 150.0000 mg | ORAL_TABLET | Freq: Every day | ORAL | Status: DC

## 2013-04-10 NOTE — Assessment & Plan Note (Signed)
BP Readings from Last 3 Encounters:  04/10/13 119/62  03/29/13 142/69  02/22/13 120/61    Lab Results  Component Value Date   [name] 138 09/28/2012   K 4.4 09/28/2012   CREATININE 1.57* 09/28/2012    Assessment: Blood pressure control: controlled Progress toward BP goal:  at goal   Plan: Medications:  continue current medications Educational resources provided: brochure;handout;video Self management tools provided:   Other plans:  1- will check her BMP and give Losartan refill.

## 2013-04-10 NOTE — Assessment & Plan Note (Signed)
Assessment: She endorses baseline blurry vision. Hs been evaluated by [facility] in the past.   Plan: - retina camera today - she has difficulties in transportation. Will send SW referral

## 2013-04-10 NOTE — Assessment & Plan Note (Signed)
Lab Results  Component Value Date   HGBA1C 7.9 04/10/2013   HGBA1C 8.3 09/28/2012   HGBA1C 8.0 06/27/2012     Assessment: Diabetes control: fair control Progress toward A1C goal:  improved Patient is noncompliant with her medical therapy and continues to take lantus 30 units QHS despite her dose was increased to 34 units in August 2014. She is noncompliant with the follow up appts and had multiple no shows  She reports that she checks her CBGS 3-4 times daily but did not bring the meter to the OV. Denies hypoglycemic episodes.   Plan: Medications:  continue current medications Home glucose monitoring: Frequency:   Timing:   Instruction/counseling given: reminded to get eye exam, reminded to bring blood glucose meter & log to each visit, reminded to bring medications to each visit, discussed foot care and discussed the need for weight loss Educational resources provided: brochure;handout Self management tools provided:   Other plans:   1. Instruct patient to continue to check her CBGS 4 times daily   2. Instruct patient to bring her meter along with the CBGS readings to the clinic in 1-2 weeks  3. Her A1C is 7.9 today.  Based on her weight and renal function, her basal requirement is at about 15 units QHS. Given her overweight, she could require more basal indulin coverage. She is on lantus 30 units now. I suspect that this is enough basal for her, and her mildly elevated A1C is likely associated with postprandial hyperglycemia since she is not on any prandial coverage.   Will need more CBG data to adjust her insulin. May decrease her basal if she has low am FBGs. Consider to add prandial coverage such as Novolog to help her to reach optimal control.   I have discussed with her in details about above plan. She understands and agrees with it.   Will follow up in 1-2 weeks.

## 2013-04-10 NOTE — Assessment & Plan Note (Signed)
Assessment: She continues to smoke Cig 4-5 daily and states that she would like to quit on her own. Declined nicotine patch or medical therapy  Plan: The importance of Tobacco cessation is discussed with patient and will follow up closely.

## 2013-04-10 NOTE — Assessment & Plan Note (Signed)
Assessment: Patient reports that she has had chronic low back pain for years. The pain is always located on lower back and occasionally radiates to buttock area. No radiating pain to posterior thighs/legs.  Denies weakness, bowel/urinary incontinence.  She was treated with Voltaren gel , Tylenol, gabapentin, Lyrica, and/or tramadol in the past without much improvement.  She was referred to pain clinic twice in the past but did not show up.  She reports no change of her pain characteristics.   Lumbar X ray in 2013 IMPRESSION:  1. No acute radiographic abnormality of the lumbar spine.  2. Mild multilevel degenerative disc disease and lumbar  spondylosis, including 3 mm of anterolisthesis of L4 upon L5.  Plan - Will send the pain clinic referral again.  I discussed with the patient the importance of medical compliance with her appt. - Patient understands and agrees with it.

## 2013-04-10 NOTE — Assessment & Plan Note (Addendum)
Assessment: CKD stage III for years. Likely associated with the uncontrolled DM and HTN.   Plan: - better control DM and HTN - avoid OTC NSAIDs - a list of NSAIDs given - detailed discussion with patient about the CKD.    List of NSAIDS (Non-steroidal anti-inflammatory medications) AVOID THESE MEDICATIONS  Aspirin (Anacin, Ascriptin, Bayer, Bufferin, Ecotrin, Excedrin) Choline and magnesium salicylates (CMT, Tricosal, Trilisate) Choline salicylate (Arthropan) Celecoxib (Celebrex) Diclofenac potassium (Cataflam) Diclofenac sodium (Voltaren, Voltaren XR) Diclofenac sodium with misoprostol (Arthrotec) Diflunisal (Dolobid) Etodolac (Lodine, Lodine XL) Fenoprofen calcium (Nalfon) Flurbiprofen (Ansaid) Ibuprofen (Advil, Motrin, Motrin IB, Nuprin) Indomethacin (Indocin, Indocin SR) Ketoprofen (Actron, Orudis, Orudis KT, Oruvail) Magnesium salicylate (Arthritab, Bayer Select, Doan's Pills, Magan, Mobidin, Mobogesic) Meclofenamate sodium (Meclomen) Mefenamic acid (Ponstel) Meloxicam (Mobic) Nabumetone (Relafen) Naproxen (Naprosyn, Naprelan*) Naproxen sodium (Aleve, Anaprox) Oxaprozin (Daypro) Piroxicam (Feldene) Rofecoxib (Vioxx) Salsalate (Amigesic, Anaflex 750, Disalcid, Marthritic, Mono-Gesic, Salflex, Salsitab) Sodium salicylate (various generics) Sulindac (Clinoril) Tolmetin sodium (Tolectin) Valdecoxib (Bextra)

## 2013-04-10 NOTE — Patient Instructions (Signed)
1. Continue to take your current medications 2. Please bring the readings of CBGs and meter and medication bottles. 3. Please check you CBg 4 times for next 2 weeks 4. Follow up in two weeks. 4. Avoid NSAIDs  List of NSAIDS (Non-steroidal anti-inflammatory medications) AVOID THESE MEDICATIONS  Aspirin (Anacin, Ascriptin, Bayer, Bufferin, Ecotrin, Excedrin) Choline and magnesium salicylates (CMT, Tricosal, Trilisate) Choline salicylate (Arthropan) Celecoxib (Celebrex) Diclofenac potassium (Cataflam) Diclofenac sodium (Voltaren, Voltaren XR) Diclofenac sodium with misoprostol (Arthrotec) Diflunisal (Dolobid) Etodolac (Lodine, Lodine XL) Fenoprofen calcium (Nalfon) Flurbiprofen (Ansaid) Ibuprofen (Advil, Motrin, Motrin IB, Nuprin) Indomethacin (Indocin, Indocin SR) Ketoprofen (Actron, Orudis, Orudis KT, Oruvail) Magnesium salicylate (Arthritab, Bayer Select, Doan's Pills, Magan, Mobidin, Mobogesic) Meclofenamate sodium (Meclomen) Mefenamic acid (Ponstel) Meloxicam (Mobic) Nabumetone (Relafen) Naproxen (Naprosyn, Naprelan*) Naproxen sodium (Aleve, Anaprox) Oxaprozin (Daypro) Piroxicam (Feldene) Rofecoxib (Vioxx) Salsalate (Amigesic, Anaflex 750, Disalcid, Marthritic, Mono-Gesic, Salflex, Salsitab) Sodium salicylate (various generics) Sulindac (Clinoril) Tolmetin sodium (Tolectin) Valdecoxib (Bextra)

## 2013-04-10 NOTE — Progress Notes (Signed)
Patient ID: [name], female   DOB: Feb 02, 1951, 63 y.o.   MRN: [id number]    Patient: [name]   MRN: [id number]  DOB: Jul 15, 1950  PCP: Charlann Lange, MD   Subjective:    CC: Follow-up and Medication Refill   HPI: [name] is a 63 y.o. female with a PMHx of T2DM, diabetic retinopathy and peripheral neuropathy, HTN, HLD, who presented to clinic today for the followup. Patient is chronic no show, and her last OV with the clinic was in August 2014. As her PCP, I have repeatedly scheduled follow up appts for her to see me and she has had multiple no shows.   1) T2DM, last A1C 8.3 on 09/28/12.     Patient was last evaluated in the clinic on 09/28/12.  Her A1c was 8.3.  She was asked to increase her Lantus insulin from 30 units to 34 units at bedtime.  She was asked to followup with clinic.  However, patient states that she continues to take Lantus insulin 30 units at bedtime.  And she had multiple no shows to our clinic office visit.  2)  HTN,     well controlled on monotherapy  3)  HLD,      LDL 69 in August 2014. Not on meds.   4) tobacco abuse    Patient states that she continues to smoke 4-5 cigarettes daily.  She was on nicotine patch in the past but doesn't think is helpful.  She doesn't want to take any medications to help her quit smoking.  She states that she would try to quit on her own.      Health maintenance - Lab: A1C, BMP, Urine micro albumin--today - retina camera-today - influenza vaccine--today - Zostavax--deferred - foot exam -deferred - pap smear--defer to free pap clinic - colonoscopy--done in 2007-- will obtain the report.    Review of Systems: Per HPI.   Current Outpatient Medications: Current Outpatient Prescriptions  Medication Sig Dispense Refill  . albuterol (PROVENTIL HFA) 108 (90 BASE) MCG/ACT inhaler Inhale 2 puffs into the lungs [name] 6 (six) hours as needed for wheezing.  6.7 g  1  . alendronate (FOSAMAX) 70 MG tablet Take 1 tablet (70 mg  total) by mouth [name] 7 (seven) days. Take with a full glass of water on an empty stomach. Takes on Tues.  4 tablet  1  . Blood Glucose Monitoring Suppl (ACCU-CHEK AVIVA PLUS) W/DEVICE KIT 1 Device by Does not apply route 3 (three) times daily.  1 kit  1  . glucose blood test strip Use as instructed  100 each  12  . insulin glargine (LANTUS) 100 UNIT/ML injection Inject 0.34 mLs (34 Units total) into the skin at bedtime.  11 mL  3  . Insulin Pen Needle 31G X 8 MM MISC 1 each by Does not apply route once.  100 each  0  . Insulin Syringe-Needle U-100 (INSULIN SYRINGE .5CC/31GX5/16") 31G X 5/16" 0.5 ML MISC Inject 1 each into the skin 3 (three) times daily.  100 each  11  . losartan (COZAAR) 25 MG tablet       . nicotine (NICODERM CQ - DOSED IN MG/24 HR) 7 mg/24hr patch Place 1 patch onto the skin daily.  30 patch  0  . Pancrelipase, Lip-Prot-Amyl, (CREON) 24000 UNITS CPEP TAKE 1-3 CAPSULES WITH MEALS & SNACKS   MAX OF 10caps/DAY  900 capsule  1  . polyethylene glycol (MIRALAX / GLYCOLAX) packet Take 17  g by mouth daily as needed. For constipation  14 each  1  . ranitidine (ZANTAC) 150 MG tablet Take 1 tablet (150 mg total) by mouth daily with breakfast.  60 tablet  1  . travoprost, benzalkonium, (TRAVATAN) 0.004 % ophthalmic solution Place 1 drop into both eyes at bedtime.      . Calcium Carbonate-Vitamin D (CALCIUM-VITAMIN D) 500-200 MG-UNIT per tablet Take 1 tablet by mouth daily with breakfast.       No current facility-administered medications for this visit.    Allergies: No Known Allergies  Past Medical History  Diagnosis Date  . Diabetes mellitus   . Hypertension   . ANEMIA, CHRONIC DISEASE NEC 03/15/2006  . DEGENERATIVE JOINT DISEASE 05/06/2007    On going for >5 yrs No imaging to confirm Has tried ultram, mobic, neurontin which did not work Ambulance person tried once worked well    . 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 DIABETIC  RETINOPATHY 03/15/2006  . Hyperlipidemia 04/01/2011  .  PANCREATITIS, CHRONIC 03/29/2006  . RENAL INSUFFICIENCY, CHRONIC 03/15/2006    Objective:    Physical Exam: Filed Vitals:   04/10/13 1048  BP: 119/62  Pulse: 75  Temp: 98.4 F (36.9 C)  TempSrc: Oral  Height: 5' 5"  (1.651 m)  Weight: 186 lb 12.8 oz (84.732 kg)  SpO2: 98%     General: Vital signs reviewed and noted. Well-developed, well-nourished, in no acute distress; alert, appropriate and cooperative throughout examination.  Head: Normocephalic, atraumatic.  Lungs:  Normal respiratory effort. Clear to auscultation BL without crackles or wheezes.  Heart: RRR. S1 and S2 normal without gallop, rubs,  murmur.  Abdomen:  BS normoactive. Soft, Nondistended, non-tender.  No masses or organomegaly.  Extremities: No pretibial edema. Paraspinal mild tenderness to palpation at Lumbosacral area. SLRT negative.    Assessment/ Plan:

## 2013-04-11 LAB — MICROALBUMIN / CREATININE URINE RATIO
CREATININE, URINE: 123.3 mg/dL
Microalb Creat Ratio: 35.7 mg/g — ABNORMAL HIGH (ref 0.0–30.0)
Microalb, Ur: 4.4 mg/dL — ABNORMAL HIGH (ref 0.00–1.89)

## 2013-04-12 ENCOUNTER — Telehealth: Payer: Self-pay | Admitting: Licensed Clinical Social Worker

## 2013-04-12 ENCOUNTER — Ambulatory Visit: Payer: Medicaid Other | Admitting: Podiatry

## 2013-04-12 NOTE — Telephone Encounter (Signed)
Ms. Whittier was referred to CSW for transportation difficulties.  Pt aware of Medicaid Medical Transportation and is current with [facility]  Ms. Beste states [facility] stated transportation was only for surgeries.  CSW corrected, stating medicaid covered transportation to medical appointments including eye appointments.  Discussed pt may need voucher completed or more documentation if out of county.  Pt denies add'l needs at this time.  Ms. Folger is aware CSW is available to assist as needed.  CSW will sign off.

## 2013-04-14 NOTE — Progress Notes (Signed)
Case discussed with Dr. Li at the time of the visit.  We reviewed the resident's history and exam and pertinent patient test results.  I agree with the assessment, diagnosis, and plan of care documented in the resident's note. 

## 2013-04-17 ENCOUNTER — Ambulatory Visit: Payer: Medicaid Other | Admitting: Podiatry

## 2013-04-21 ENCOUNTER — Telehealth: Payer: Self-pay | Admitting: *Deleted

## 2013-04-21 ENCOUNTER — Ambulatory Visit: Payer: Medicaid Other | Admitting: Internal Medicine

## 2013-04-21 NOTE — Telephone Encounter (Signed)
Received PA request from pt's pharmacy for her losartan 25mg  tabs once daily #30.  Per epic, pt did develop a cough on lisinopril in the past and started on losartan.  Submitted request online via [place] and request sent for review.Regenia Skeeter, Myrtie Leuthold Cassady2/27/20154:30 PM    [place] Tracks Confirmation #: [id number] W

## 2013-04-27 ENCOUNTER — Other Ambulatory Visit: Payer: Self-pay | Admitting: *Deleted

## 2013-04-27 MED ORDER — ACCU-CHEK AVIVA PLUS W/DEVICE KIT: 1.0000 | PACK | Freq: Three times a day (TID) | Status: DC

## 2013-04-27 NOTE — Telephone Encounter (Signed)
Pt needs a new meter

## 2013-04-27 NOTE — Telephone Encounter (Signed)
Requests was approved 04/21/2013 - 04/21/2014, pt and pharmacy aware.Despina Hidden Cassady3/5/20154:30 PM

## 2013-04-28 ENCOUNTER — Ambulatory Visit: Payer: Medicaid Other | Admitting: Internal Medicine

## 2013-05-03 ENCOUNTER — Encounter: Payer: Self-pay | Admitting: Podiatry

## 2013-05-03 ENCOUNTER — Ambulatory Visit (INDEPENDENT_AMBULATORY_CARE_PROVIDER_SITE_OTHER): Payer: Medicaid Other | Admitting: Podiatry

## 2013-05-03 VITALS — BP 125/60 | HR 80 | Resp 12

## 2013-05-03 DIAGNOSIS — M79609 Pain in unspecified limb: Secondary | ICD-10-CM

## 2013-05-03 DIAGNOSIS — L97509 Non-pressure chronic ulcer of other part of unspecified foot with unspecified severity: Secondary | ICD-10-CM

## 2013-05-03 DIAGNOSIS — B351 Tinea unguium: Secondary | ICD-10-CM

## 2013-05-03 NOTE — Patient Instructions (Signed)
Apply Silvadene to the skin ulcer on the base of the fifth right toe area daily cover with gauze until the callus forms. Wear the surgical shoe on the right foot.

## 2013-05-04 NOTE — Progress Notes (Signed)
Patient ID: [name], female   DOB: 1950/05/07, 63 y.o.   MRN: [id number]  Subjective: This patient presents for ongoing care for recurrent diabetic foot ulcers and painful mycotic toenails.  Objective: Elongated, hypertrophic, discolored, incurvated, toenails x10 with palpable tenderness in all the toenails.  hemorrhagic hyperkeratoses plantar fifth right MPJ. After debridement breakdown into a 10 mm superficial ulcer with a granular base. Plantar keratoses sub-right first MPJ and left first MPJ without bleeding.  Assessment: Symptomatic onychomycoses x10 Superficial ulceration plantar right fifth MPJ without clinical sign of infection Pre-ulcerative hyperkeratoses bilaterally  Plan: Nails x10 debrided without any bleeding Skin ulcer right debrided packed with Silvadene and dressed Plantar keratoses right and left debrided  Patient will apply Silvadene to skin ulcer right foot daily until a callus forms. She will wear a surgical shoe on the right foot.  Reappoint x2 weeks

## 2013-05-10 ENCOUNTER — Ambulatory Visit: Payer: Medicaid Other | Admitting: Internal Medicine

## 2013-05-11 ENCOUNTER — Telehealth: Payer: Self-pay | Admitting: Licensed Clinical Social Worker

## 2013-05-11 NOTE — Telephone Encounter (Signed)
Ms. Stetzer placed call to CSW to notify of transportation did not pick up.  Ms. Laursen states she called and scheduled transportation through [facility] for a 7:15am pick for 3/18 appt.  Pt states she never saw Lucianne Lei come and tried to call [employer] but was on hold.  Ms. Belvin did call front office to notify, pt agreeable for CSW to send information to [facility] regarding missed pick up.  Information sent to Ms. Tacy Dura.

## 2013-05-12 ENCOUNTER — Ambulatory Visit: Payer: Medicaid Other | Admitting: Internal Medicine

## 2013-05-17 ENCOUNTER — Ambulatory Visit: Payer: Medicaid Other | Admitting: Podiatry

## 2013-05-20 ENCOUNTER — Emergency Department (HOSPITAL_COMMUNITY): Payer: Medicaid Other

## 2013-05-20 ENCOUNTER — Encounter (HOSPITAL_COMMUNITY): Payer: Self-pay | Admitting: Emergency Medicine

## 2013-05-20 ENCOUNTER — Emergency Department (HOSPITAL_COMMUNITY)
Admission: EM | Admit: 2013-05-20 | Discharge: 2013-05-20 | Disposition: A | Discharge status: Home / Self Care | Payer: Medicaid Other | Attending: Emergency Medicine | Admitting: Emergency Medicine

## 2013-05-20 DIAGNOSIS — E1142 Type 2 diabetes mellitus with diabetic polyneuropathy: Secondary | ICD-10-CM | POA: Insufficient documentation

## 2013-05-20 DIAGNOSIS — Z87448 Personal history of other diseases of urinary system: Secondary | ICD-10-CM | POA: Insufficient documentation

## 2013-05-20 DIAGNOSIS — E11319 Type 2 diabetes mellitus with unspecified diabetic retinopathy without macular edema: Secondary | ICD-10-CM | POA: Insufficient documentation

## 2013-05-20 DIAGNOSIS — Z8719 Personal history of other diseases of the digestive system: Secondary | ICD-10-CM | POA: Insufficient documentation

## 2013-05-20 DIAGNOSIS — Z79899 Other long term (current) drug therapy: Secondary | ICD-10-CM | POA: Insufficient documentation

## 2013-05-20 DIAGNOSIS — Z862 Personal history of diseases of the blood and blood-forming organs and certain disorders involving the immune mechanism: Secondary | ICD-10-CM | POA: Insufficient documentation

## 2013-05-20 DIAGNOSIS — F17200 Nicotine dependence, unspecified, uncomplicated: Secondary | ICD-10-CM | POA: Insufficient documentation

## 2013-05-20 DIAGNOSIS — M1990 Unspecified osteoarthritis, unspecified site: Secondary | ICD-10-CM | POA: Insufficient documentation

## 2013-05-20 DIAGNOSIS — M47817 Spondylosis without myelopathy or radiculopathy, lumbosacral region: Secondary | ICD-10-CM | POA: Insufficient documentation

## 2013-05-20 DIAGNOSIS — E1139 Type 2 diabetes mellitus with other diabetic ophthalmic complication: Secondary | ICD-10-CM | POA: Insufficient documentation

## 2013-05-20 DIAGNOSIS — Z794 Long term (current) use of insulin: Secondary | ICD-10-CM | POA: Insufficient documentation

## 2013-05-20 DIAGNOSIS — M47814 Spondylosis without myelopathy or radiculopathy, thoracic region: Secondary | ICD-10-CM | POA: Insufficient documentation

## 2013-05-20 DIAGNOSIS — I10 Essential (primary) hypertension: Secondary | ICD-10-CM | POA: Insufficient documentation

## 2013-05-20 DIAGNOSIS — E1149 Type 2 diabetes mellitus with other diabetic neurological complication: Secondary | ICD-10-CM | POA: Insufficient documentation

## 2013-05-20 DIAGNOSIS — M5135 Other intervertebral disc degeneration, thoracolumbar region: Secondary | ICD-10-CM

## 2013-05-20 LAB — COMPREHENSIVE METABOLIC PANEL
ALT: 29 U/L (ref 0–35)
AST: 28 U/L (ref 0–37)
Albumin: 3.5 g/dL (ref 3.5–5.2)
Alkaline Phosphatase: 70 U/L (ref 39–117)
BUN: 50 mg/dL — ABNORMAL HIGH (ref 6–23)
CALCIUM: 9.7 mg/dL (ref 8.4–10.5)
CO2: 20 mEq/L (ref 19–32)
CREATININE: 1.39 mg/dL — AB (ref 0.50–1.10)
Chloride: 105 mEq/L (ref 96–112)
GFR calc Af Amer: 46 mL/min — ABNORMAL LOW (ref >90)
GFR, EST NON AFRICAN AMERICAN: 40 mL/min — AB (ref >90)
Glucose, Bld: 122 mg/dL — ABNORMAL HIGH (ref 70–99)
Potassium: 4.4 mEq/L (ref 3.7–5.3)
SODIUM: 140 meq/L (ref 137–147)
Total Bilirubin: 0.2 mg/dL — ABNORMAL LOW (ref 0.3–1.2)
Total Protein: 7.4 g/dL (ref 6.0–8.3)

## 2013-05-20 LAB — CBC WITH DIFFERENTIAL/PLATELET
BASOS PCT: 0 % (ref 0–1)
Basophils Absolute: 0 10*3/uL (ref 0.0–0.1)
EOS ABS: 0.1 10*3/uL (ref 0.0–0.7)
Eosinophils Relative: 3 % (ref 0–5)
HCT: 32.8 % — ABNORMAL LOW (ref 36.0–46.0)
HEMOGLOBIN: 11.3 g/dL — AB (ref 12.0–15.0)
LYMPHS ABS: 1.5 10*3/uL (ref 0.7–4.0)
Lymphocytes Relative: 42 % (ref 12–46)
MCH: 31 pg (ref 26.0–34.0)
MCHC: 34.5 g/dL (ref 30.0–36.0)
MCV: 90.1 fL (ref 78.0–100.0)
MONO ABS: 0.3 10*3/uL (ref 0.1–1.0)
Monocytes Relative: 7 % (ref 3–12)
NEUTROS ABS: 1.7 10*3/uL (ref 1.7–7.7)
NEUTROS PCT: 47 % (ref 43–77)
Platelets: 136 10*3/uL — ABNORMAL LOW (ref 150–400)
RBC: 3.64 MIL/uL — AB (ref 3.87–5.11)
RDW: 14.1 % (ref 11.5–15.5)
WBC: 3.6 10*3/uL — ABNORMAL LOW (ref 4.0–10.5)

## 2013-05-20 LAB — LIPASE, BLOOD: Lipase: 7 U/L — ABNORMAL LOW (ref 11–59)

## 2013-05-20 MED ORDER — TRAMADOL HCL 50 MG PO TABS: 50.0000 mg | ORAL_TABLET | Freq: Four times a day (QID) | ORAL | Status: DC | PRN

## 2013-05-20 NOTE — ED Notes (Addendum)
Patient c/o lower back pain, was told last month by a doctor that it was her kidneys, was not placed on any medications.  Pain is relieved with rest and sitting down.  Denies burning when urinating, denies previous n/v, denies other complaints.  Pain has been going on for 5-6 years.    Also c/o upper back/shoulder pain for 1 year.

## 2013-05-20 NOTE — Discharge Instructions (Signed)

## 2013-05-20 NOTE — ED Provider Notes (Signed)
CSN: [id number]     Arrival date & time 05/20/13  1447 History   First MD Initiated Contact with Patient 05/20/13 1510     Chief Complaint  Patient presents with  . Back Pain      HPI Shes been having lower back pain for past few weeks. She states she went to [facility] and "they told me it was my kidneys but it still hurts." denies bowel/bladder changes. States the pain is relieved by sitting down  Past Medical History  Diagnosis Date  . Diabetes mellitus   . Hypertension   . ANEMIA, CHRONIC DISEASE NEC 03/15/2006  . DEGENERATIVE JOINT DISEASE 05/06/2007    On going for >5 yrs No imaging to confirm Has tried ultram, mobic, neurontin which did not work Ambulance person tried once worked well    . 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 DIABETIC  RETINOPATHY 03/15/2006  . Hyperlipidemia 04/01/2011  . PANCREATITIS, CHRONIC 03/29/2006  . RENAL INSUFFICIENCY, CHRONIC 03/15/2006   Past Surgical History  Procedure Laterality Date  . Breast surgery    . Mouth surgery N/A    History reviewed. No pertinent family history. History  Substance Use Topics  . Smoking status: Current Some Day Smoker -- 0.50 packs/day    Types: Cigarettes  . Smokeless tobacco: Not on file     Comment: Quit Line - wants to know if can get some patches.SMOKES WHENEVER SHE GET THEM  . Alcohol Use: 0.0 oz/week     Comment: daily   OB History   Grav Para Term Preterm Abortions TAB SAB Ect Mult Living                 Review of Systems  All other systems reviewed and are negative.      Allergies  Review of patient's allergies indicates no known allergies.  Home Medications   Current Outpatient Rx  Name  Route  Sig  Dispense  Refill  . albuterol (PROVENTIL HFA) 108 (90 BASE) MCG/ACT inhaler   Inhalation   Inhale 2 puffs into the lungs every 6 (six) hours as needed for wheezing.   6.7 g   6   . alendronate (FOSAMAX) 70 MG tablet   Oral   Take 1 tablet (70 mg total) by mouth every 7 (seven) days. Take with a  full glass of water on an empty stomach. Takes on Tues.   4 tablet   6   . Calcium Carbonate-Vitamin D (CALCIUM-VITAMIN D) 500-200 MG-UNIT per tablet   Oral   Take 1 tablet by mouth 2 (two) times daily.   60 tablet   6   . insulin glargine (LANTUS) 100 UNIT/ML injection   Subcutaneous   Inject 0.34 mLs (34 Units total) into the skin at bedtime.   11 mL   3   . losartan (COZAAR) 25 MG tablet   Oral   Take 1 tablet (25 mg total) by mouth daily.   30 tablet   6   . Pancrelipase, Lip-Prot-Amyl, 24000 UNITS CPEP   Oral   Take 2-3 capsules by mouth 3 (three) times daily. With meals and snacks. No more than 10/24hours         . polyethylene glycol (MIRALAX / GLYCOLAX) packet   Oral   Take 17 g by mouth daily as needed. For constipation   14 each   6   . travoprost, benzalkonium, (TRAVATAN) 0.004 % ophthalmic solution   Both Eyes   Place 1 drop into both eyes at bedtime.         Marland Kitchen  traMADol (ULTRAM) 50 MG tablet   Oral   Take 1 tablet (50 mg total) by mouth every 6 (six) hours as needed.   30 tablet   0    BP 142/68  Pulse 78  Temp(Src) 98.1 F (36.7 C) (Oral)  Resp 18  Ht 5\' 5"  (1.651 m)  Wt 187 lb 4.8 oz (84.959 kg)  BMI 31.17 kg/m2  SpO2 97% Physical Exam  Nursing note and vitals reviewed. Constitutional: She is oriented to person, place, and time. She appears well-developed and well-nourished. No distress.  HENT:  Head: Normocephalic and atraumatic.  Eyes: Pupils are equal, round, and reactive to light.  Neck: Normal range of motion.  Cardiovascular: Normal rate and intact distal pulses.   Pulmonary/Chest: No respiratory distress.  Abdominal: Normal appearance. She exhibits no distension.  Musculoskeletal: Normal range of motion.       Back:  Neurological: She is alert and oriented to person, place, and time. No cranial nerve deficit.  Skin: Skin is warm and dry. No rash noted.  Psychiatric: She has a normal mood and affect. Her behavior is normal.     ED Course  Procedures (including critical care time) Labs Review Labs Reviewed  LIPASE, BLOOD - Abnormal; Notable for the following:    Lipase 7 (*)    All other components within normal limits  COMPREHENSIVE METABOLIC PANEL - Abnormal; Notable for the following:    Glucose, Bld 122 (*)    BUN 50 (*)    Creatinine, Ser 1.39 (*)    Total Bilirubin 0.2 (*)    GFR calc non Af Amer 40 (*)    GFR calc Af Amer 46 (*)    All other components within normal limits  CBC WITH DIFFERENTIAL - Abnormal; Notable for the following:    WBC 3.6 (*)    RBC 3.64 (*)    Hemoglobin 11.3 (*)    HCT 32.8 (*)    Platelets 136 (*)    All other components within normal limits   Imaging Review No results found.    MDM   Final diagnoses:  DJD (degenerative joint disease), thoracolumbar        Dot Lanes, MD 05/28/13 2219

## 2013-05-20 NOTE — ED Notes (Signed)
Patient transported to X-ray 

## 2013-05-20 NOTE — ED Notes (Signed)
Patient does not want tramadol prescription

## 2013-05-20 NOTE — ED Notes (Addendum)
Shes been having lower back pain for past few weeks. She states she went to [facility] and "they told me it was my kidneys but it still hurts." denies bowel/bladder changes. States the pain is relieved by sitting down

## 2013-05-31 ENCOUNTER — Ambulatory Visit: Payer: Medicaid Other | Admitting: Podiatry

## 2013-06-19 ENCOUNTER — Ambulatory Visit: Payer: Medicaid Other | Admitting: Podiatry

## 2013-06-26 ENCOUNTER — Ambulatory Visit (INDEPENDENT_AMBULATORY_CARE_PROVIDER_SITE_OTHER): Payer: Medicaid Other | Admitting: Podiatry

## 2013-06-26 ENCOUNTER — Encounter: Payer: Self-pay | Admitting: Podiatry

## 2013-06-26 VITALS — BP 113/54 | HR 66 | Temp 97.3°F | Resp 15 | Ht 65.5 in | Wt 186.0 lb

## 2013-06-26 DIAGNOSIS — L97509 Non-pressure chronic ulcer of other part of unspecified foot with unspecified severity: Secondary | ICD-10-CM

## 2013-06-26 NOTE — Patient Instructions (Signed)
Apply Silvadene to skin ulcers on the right foot daily until drainage ends. Continue to wear the surgical shoe on the right foot.

## 2013-06-27 NOTE — Progress Notes (Signed)
Patient ID: [name], female   DOB: 07-Sep-1950, 63 y.o.   MRN: [id number]  Subjective: Orientated x15 63 year old diabetic black female presents for ongoing care for diabetic foot ulcers and pre-ulcerative hyperkeratoses on the plantar aspect of right and left feet.  Objective: Hemorrhagic keratoses sub-first and fifth MPJ right and plantar left first MPJ. Post debridement of all 3 lesions reveals a 5 mm superficial ulcer plantar fifth right MPJ  Assessment: Superficial ulceration fifth right MPJ Pre-ulcerative hyperkeratotic lesions first MPJ bilaterally  Plan: Ulcer and keratoses debrided. Patient will continue to apply Silvadene to the ulcer site in the fifth MPJ right until the drainage and or wound closes. She will continue to wear the surgical shoe on the right foot.  Reappoint x2 weeks

## 2013-07-12 ENCOUNTER — Ambulatory Visit (INDEPENDENT_AMBULATORY_CARE_PROVIDER_SITE_OTHER): Payer: Medicaid Other | Admitting: Podiatry

## 2013-07-12 ENCOUNTER — Encounter: Payer: Self-pay | Admitting: Podiatry

## 2013-07-12 VITALS — BP 141/65 | HR 60 | Temp 96.9°F | Resp 17 | Ht 65.5 in | Wt 186.0 lb

## 2013-07-12 DIAGNOSIS — L97509 Non-pressure chronic ulcer of other part of unspecified foot with unspecified severity: Secondary | ICD-10-CM

## 2013-07-12 NOTE — Patient Instructions (Signed)
Apply Silvadene cream to skin ulcer base of right great code daily and cover with gauze until no drainage is noted.

## 2013-07-13 NOTE — Progress Notes (Signed)
Patient ID: [name], female   DOB: 1950/09/02, 63 y.o.   MRN: [id number] Subjective: 63 year old diabetic female presents for ongoing care for diabetic foot ulcerations  Objective: Superficial skin ulcer plantar fifth right MPJ with granular base, 5 mm in diameter surrounded by hyperkeratotic tissue Hemorrhagic tissue first MPJ right Hyperkeratoses plantar left first MPJ  Assessment: Superficial ulceration right fifth MPJ clinically noninfected Pre-ulcerative hyperkeratoses plantar right first MPJ and plantar first left MPJ  Plan: Ulcer site and hyperkeratotic tissue debrided Apply Silvadene to ulcer site on the fifth right MPJ daily and cover with gauze until healed  Reappoint x2 weeks

## 2013-07-21 ENCOUNTER — Encounter: Payer: Self-pay | Admitting: Internal Medicine

## 2013-07-21 ENCOUNTER — Ambulatory Visit: Payer: Medicaid Other | Admitting: Internal Medicine

## 2013-07-26 ENCOUNTER — Ambulatory Visit: Payer: Medicaid Other | Admitting: Podiatry

## 2013-07-26 ENCOUNTER — Other Ambulatory Visit: Payer: Self-pay | Admitting: *Deleted

## 2013-07-26 MED ORDER — PANCRELIPASE (LIP-PROT-AMYL) 24000-76000 UNITS PO CPEP: 2.0000 | ORAL_CAPSULE | Freq: Three times a day (TID) | ORAL | Status: DC

## 2013-08-02 ENCOUNTER — Encounter: Payer: Self-pay | Admitting: Podiatry

## 2013-08-02 ENCOUNTER — Ambulatory Visit (INDEPENDENT_AMBULATORY_CARE_PROVIDER_SITE_OTHER): Payer: Medicaid Other | Admitting: Podiatry

## 2013-08-02 VITALS — BP 137/59 | HR 63 | Temp 96.9°F | Resp 15

## 2013-08-02 DIAGNOSIS — L97509 Non-pressure chronic ulcer of other part of unspecified foot with unspecified severity: Secondary | ICD-10-CM

## 2013-08-03 NOTE — Progress Notes (Signed)
Patient ID: [name], female   DOB: Oct 17, 1950, 63 y.o.   MRN: [id number]  Subjective: Orientated x59 space 63 year old black diabetic female presents for ongoing care for diabetic foot ulcers. Most recently the fifth right MPJ has developed a recurrent skin ulcer  Objective: Hemorrhagic hyperkeratoses plantar fifth right MPJ raking down into a 5 mm superficial ulcer with a granular base. There is no active erythema, edema or drainage surrounding the area. Keratoses plantar right first MPJ.  Keratoses plantar first and fifth MPJ without bleeding  Toenails x10 are elongated, hypertrophic, discolored  Assessment: Superficial ulceration plantar fifth MPJ right without clinical sign of infection Keratoses plantar first MPJ right Keratoses first and fifth MPJ left  Plan: Debridement ulcer and keratoses Patient advised to apply Silvadene if any drainage noted to the fifth right MPJ area Wear diabetic shoes with custom insoles  Reappoint x2 weeks Debrided ulcers/keratoses Debrided mycotic toenails

## 2013-08-07 ENCOUNTER — Telehealth: Payer: Self-pay | Admitting: Licensed Clinical Social Worker

## 2013-08-07 NOTE — Telephone Encounter (Signed)
CSW placed call to [name] to inquire about frequent NoShow.  Pt states she is currently providing primary care to her grandchild and has difficulty find a Actuary.  CSW suggested bringing grandchild with her to appointment as long as child does not have a communicable illness and hospital does not have current restrictions.  Pt agreeable.  Pt would like assistance with scheduling and states she uses [facility] for transportation.

## 2013-08-16 ENCOUNTER — Ambulatory Visit: Payer: Medicaid Other | Admitting: Podiatry

## 2013-08-16 ENCOUNTER — Telehealth: Payer: Self-pay | Admitting: Licensed Clinical Social Worker

## 2013-08-16 NOTE — Telephone Encounter (Signed)
CSW placed call to assist Ms. Stanke with transportation assistance for upcoming appointments.  Pt in agreement and aware of appt on 7/9.  CSW inquired about transportation for 7/1 podiatry, pt states need for transportation and in agreement for CSW to assist.  CSW sent request for all above appt's.

## 2013-08-17 NOTE — Telephone Encounter (Signed)
Transportation for 7/1 podiatry scheduled pick up btwn 8:10 to 8:50. Pt notified today.

## 2013-08-23 ENCOUNTER — Ambulatory Visit (INDEPENDENT_AMBULATORY_CARE_PROVIDER_SITE_OTHER): Payer: Medicaid Other | Admitting: Podiatry

## 2013-08-23 VITALS — BP 148/70 | HR 71 | Temp 97.7°F | Resp 16 | Ht 65.0 in | Wt 190.0 lb

## 2013-08-23 DIAGNOSIS — M79673 Pain in unspecified foot: Secondary | ICD-10-CM

## 2013-08-23 DIAGNOSIS — B351 Tinea unguium: Secondary | ICD-10-CM

## 2013-08-23 DIAGNOSIS — M79609 Pain in unspecified limb: Secondary | ICD-10-CM

## 2013-08-23 DIAGNOSIS — L97509 Non-pressure chronic ulcer of other part of unspecified foot with unspecified severity: Secondary | ICD-10-CM

## 2013-08-24 ENCOUNTER — Encounter: Payer: Self-pay | Admitting: Podiatry

## 2013-08-24 NOTE — Progress Notes (Signed)
Patient ID: [name], female   DOB: May 06, 1950, 63 y.o.   MRN: [id number]  Subjective: This known diabetic patient presents for ongoing care for diabetic foot ulcer and is complaining of painful toenails  Objective: The plantar fifth right MPJ has hemorrhagic keratoses that has fragile closure after debridement Keratoses plantar right first MPJ  Plantar first and fifth left MPJ have hyperkeratoses Porokeratoses left heel  The toenails are hypertrophic, elongated, discolored, and tender to palpation  Assessment: Pre-ulcerative hyperkeratotic lesions x4 Symptomatic onychomycoses 6-10  Plan: The pre-ulcerative hyperkeratotic tissue debrided x4 Nails x10 debrided Porokeratoses x1 debrided  Patient will wear her diabetic shoes If she notices any drainage to apply Silvadene to any of the plantar skin ulcers   Reappoint x2 weeks

## 2013-08-24 NOTE — Telephone Encounter (Signed)
CSW placed call to Ms. [name] to inquire about transportation arrangement for podiatry appointment on 7/1.  Pt states It went well and she was able to schedule transportation for her upcoming appointment as well.  CSW will sign off.

## 2013-08-28 LAB — HM DIABETES EYE EXAM

## 2013-08-31 ENCOUNTER — Encounter: Payer: Medicaid Other | Admitting: Internal Medicine

## 2013-08-31 ENCOUNTER — Ambulatory Visit: Payer: Medicaid Other | Admitting: Dietician

## 2013-09-05 ENCOUNTER — Encounter (HOSPITAL_COMMUNITY): Payer: Self-pay | Admitting: Emergency Medicine

## 2013-09-05 ENCOUNTER — Emergency Department (HOSPITAL_COMMUNITY)
Admission: EM | Admit: 2013-09-05 | Discharge: 2013-09-05 | Disposition: A | Discharge status: Home / Self Care | Payer: Medicaid Other | Attending: Emergency Medicine | Admitting: Emergency Medicine

## 2013-09-05 ENCOUNTER — Emergency Department (HOSPITAL_COMMUNITY): Payer: Medicaid Other

## 2013-09-05 DIAGNOSIS — E785 Hyperlipidemia, unspecified: Secondary | ICD-10-CM | POA: Insufficient documentation

## 2013-09-05 DIAGNOSIS — Z862 Personal history of diseases of the blood and blood-forming organs and certain disorders involving the immune mechanism: Secondary | ICD-10-CM | POA: Insufficient documentation

## 2013-09-05 DIAGNOSIS — Z7983 Long term (current) use of bisphosphonates: Secondary | ICD-10-CM | POA: Insufficient documentation

## 2013-09-05 DIAGNOSIS — K861 Other chronic pancreatitis: Secondary | ICD-10-CM | POA: Insufficient documentation

## 2013-09-05 DIAGNOSIS — M5137 Other intervertebral disc degeneration, lumbosacral region: Secondary | ICD-10-CM | POA: Insufficient documentation

## 2013-09-05 DIAGNOSIS — G8929 Other chronic pain: Secondary | ICD-10-CM | POA: Insufficient documentation

## 2013-09-05 DIAGNOSIS — N189 Chronic kidney disease, unspecified: Secondary | ICD-10-CM | POA: Insufficient documentation

## 2013-09-05 DIAGNOSIS — E1139 Type 2 diabetes mellitus with other diabetic ophthalmic complication: Secondary | ICD-10-CM | POA: Insufficient documentation

## 2013-09-05 DIAGNOSIS — Z794 Long term (current) use of insulin: Secondary | ICD-10-CM | POA: Diagnosis not present

## 2013-09-05 DIAGNOSIS — E1149 Type 2 diabetes mellitus with other diabetic neurological complication: Secondary | ICD-10-CM | POA: Diagnosis not present

## 2013-09-05 DIAGNOSIS — E11319 Type 2 diabetes mellitus with unspecified diabetic retinopathy without macular edema: Secondary | ICD-10-CM | POA: Diagnosis not present

## 2013-09-05 DIAGNOSIS — E1142 Type 2 diabetes mellitus with diabetic polyneuropathy: Secondary | ICD-10-CM | POA: Diagnosis not present

## 2013-09-05 DIAGNOSIS — F17200 Nicotine dependence, unspecified, uncomplicated: Secondary | ICD-10-CM | POA: Insufficient documentation

## 2013-09-05 DIAGNOSIS — I129 Hypertensive chronic kidney disease with stage 1 through stage 4 chronic kidney disease, or unspecified chronic kidney disease: Secondary | ICD-10-CM | POA: Insufficient documentation

## 2013-09-05 DIAGNOSIS — Z79899 Other long term (current) drug therapy: Secondary | ICD-10-CM | POA: Insufficient documentation

## 2013-09-05 DIAGNOSIS — M51379 Other intervertebral disc degeneration, lumbosacral region without mention of lumbar back pain or lower extremity pain: Secondary | ICD-10-CM | POA: Insufficient documentation

## 2013-09-05 DIAGNOSIS — M546 Pain in thoracic spine: Secondary | ICD-10-CM | POA: Insufficient documentation

## 2013-09-05 DIAGNOSIS — M1990 Unspecified osteoarthritis, unspecified site: Secondary | ICD-10-CM | POA: Diagnosis not present

## 2013-09-05 LAB — CBC WITH DIFFERENTIAL/PLATELET
BASOS PCT: 0 % (ref 0–1)
Basophils Absolute: 0 10*3/uL (ref 0.0–0.1)
EOS ABS: 0.2 10*3/uL (ref 0.0–0.7)
Eosinophils Relative: 4 % (ref 0–5)
HCT: 34.7 % — ABNORMAL LOW (ref 36.0–46.0)
HEMOGLOBIN: 11.4 g/dL — AB (ref 12.0–15.0)
Lymphocytes Relative: 42 % (ref 12–46)
Lymphs Abs: 1.6 10*3/uL (ref 0.7–4.0)
MCH: 30.2 pg (ref 26.0–34.0)
MCHC: 32.9 g/dL (ref 30.0–36.0)
MCV: 92 fL (ref 78.0–100.0)
Monocytes Absolute: 0.3 10*3/uL (ref 0.1–1.0)
Monocytes Relative: 7 % (ref 3–12)
Neutro Abs: 1.8 10*3/uL (ref 1.7–7.7)
Neutrophils Relative %: 47 % (ref 43–77)
Platelets: 134 10*3/uL — ABNORMAL LOW (ref 150–400)
RBC: 3.77 MIL/uL — ABNORMAL LOW (ref 3.87–5.11)
RDW: 13.9 % (ref 11.5–15.5)
WBC: 3.8 10*3/uL — ABNORMAL LOW (ref 4.0–10.5)

## 2013-09-05 LAB — I-STAT TROPONIN, ED: TROPONIN I, POC: 0.01 ng/mL (ref 0.00–0.08)

## 2013-09-05 LAB — COMPREHENSIVE METABOLIC PANEL
ALBUMIN: 3.7 g/dL (ref 3.5–5.2)
ALT: 22 U/L (ref 0–35)
ANION GAP: 15 (ref 5–15)
AST: 20 U/L (ref 0–37)
Alkaline Phosphatase: 76 U/L (ref 39–117)
BUN: 47 mg/dL — ABNORMAL HIGH (ref 6–23)
CO2: 23 mEq/L (ref 19–32)
CREATININE: 1.31 mg/dL — AB (ref 0.50–1.10)
Calcium: 9.7 mg/dL (ref 8.4–10.5)
Chloride: 103 mEq/L (ref 96–112)
GFR calc non Af Amer: 43 mL/min — ABNORMAL LOW (ref >90)
GFR, EST AFRICAN AMERICAN: 50 mL/min — AB (ref >90)
Glucose, Bld: 201 mg/dL — ABNORMAL HIGH (ref 70–99)
Potassium: 4 mEq/L (ref 3.7–5.3)
Sodium: 141 mEq/L (ref 137–147)
TOTAL PROTEIN: 7.8 g/dL (ref 6.0–8.3)
Total Bilirubin: 0.2 mg/dL — ABNORMAL LOW (ref 0.3–1.2)

## 2013-09-05 LAB — LIPASE, BLOOD: LIPASE: 7 U/L — AB (ref 11–59)

## 2013-09-05 MED ORDER — HYDROCODONE-ACETAMINOPHEN 5-325 MG PO TABS: 1.0000 | ORAL_TABLET | Freq: Four times a day (QID) | ORAL | Status: DC | PRN

## 2013-09-05 MED ORDER — DIAZEPAM 5 MG PO TABS
5.0000 mg | ORAL_TABLET | Freq: Once | ORAL | Status: AC
  Administered 2013-09-05: 5 mg via ORAL
  Filled 2013-09-05: qty 1

## 2013-09-05 MED ORDER — CYCLOBENZAPRINE HCL 10 MG PO TABS: 10.0000 mg | ORAL_TABLET | Freq: Two times a day (BID) | ORAL | Status: DC | PRN

## 2013-09-05 MED ORDER — CYCLOBENZAPRINE HCL 10 MG PO TABS
10.0000 mg | ORAL_TABLET | Freq: Two times a day (BID) | ORAL | Status: DC | PRN
Start: 2013-09-05 — End: 2013-12-01

## 2013-09-05 MED ORDER — OXYCODONE-ACETAMINOPHEN 5-325 MG PO TABS
1.0000 | ORAL_TABLET | Freq: Once | ORAL | Status: AC
  Administered 2013-09-05: 1 via ORAL
  Filled 2013-09-05: qty 1

## 2013-09-05 MED ORDER — HYDROCODONE-ACETAMINOPHEN 5-325 MG PO TABS
1.0000 | ORAL_TABLET | Freq: Once | ORAL | Status: AC
  Administered 2013-09-05: 1 via ORAL
  Filled 2013-09-05: qty 1

## 2013-09-05 NOTE — ED Notes (Signed)
PA at bedside.

## 2013-09-05 NOTE — ED Notes (Signed)
Patient transported to X-ray without distress.  

## 2013-09-05 NOTE — ED Notes (Signed)
MD at bedside; more coffee given.

## 2013-09-05 NOTE — ED Notes (Signed)
Pt returned; back on monitor; comfort measures.

## 2013-09-05 NOTE — ED Provider Notes (Signed)
CSN: [id number]     Arrival date & time 09/05/13  8592 History   First MD Initiated Contact with Patient 09/05/13 [phone]     Chief Complaint  Patient presents with  . Back Pain     (Consider location/radiation/quality/duration/timing/severity/associated sxs/prior Treatment) HPI [name] is a 63 y.o. female who presents to emergency department complaining of upper back pain. Patient states her pain began 3 days ago. States painful to move around and take deep breaths. She denies any injuries. 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 She has history of chronic lower back pain do to a degenerative disc disease, however states upper back is not chronic. She denies any pain radiation into her arms or legs. She denies any loss of bowels or urinary incontinence or retention. She states she feels like the pain is radiating into her chest. She denies any shortness of breath. She denies any nausea or vomiting. She states that she does have history of pancreatitis and this feels similar to that however she denies any abdominal pain at this time. She took some Tylenol this morning for her pain which did not help. She denies any recent travel or surgeries. She denies any leg swelling.  Past Medical History  Diagnosis Date  . Diabetes mellitus   . Hypertension   . ANEMIA, CHRONIC DISEASE NEC 03/15/2006  . DEGENERATIVE JOINT DISEASE 05/06/2007    On going for >5 yrs No imaging to confirm Has tried ultram, mobic, neurontin which did not work Ambulance person tried once worked well    . 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 DIABETIC  RETINOPATHY 03/15/2006  . Hyperlipidemia 04/01/2011  . PANCREATITIS, CHRONIC 03/29/2006  . RENAL INSUFFICIENCY, CHRONIC 03/15/2006   Past Surgical History  Procedure Laterality Date  . Breast surgery    . Mouth surgery N/A    No family history on file. History  Substance Use Topics  . Smoking status: Current Some Day Smoker -- 0.50 packs/day    Types:  Cigarettes  . Smokeless tobacco: Not on file     Comment: Quit Line - wants to know if can get some patches.SMOKES WHENEVER SHE GET THEM  . Alcohol Use: 0.0 oz/week     Comment: daily   OB History   Grav Para Term Preterm Abortions TAB SAB Ect Mult Living                 Review of Systems  Constitutional: Negative for fever and chills.  Respiratory: Positive for chest tightness. Negative for cough and shortness of breath.   Cardiovascular: Positive for chest pain. Negative for palpitations and leg swelling.  Gastrointestinal: Positive for abdominal pain. Negative for nausea, vomiting and diarrhea.  Genitourinary: Negative for dysuria, frequency, hematuria, flank pain and pelvic pain.  Musculoskeletal: Positive for back pain. Negative for arthralgias, myalgias, neck pain and neck stiffness.  Skin: Negative for rash.  Neurological: Negative for dizziness, weakness and headaches.  All other systems reviewed and are negative.     Allergies  Review of patient's allergies indicates no known allergies.  Home Medications   Prior to Admission medications   Medication Sig Start Date End Date Taking? Authorizing Provider  albuterol (PROVENTIL HFA) 108 (90 BASE) MCG/ACT inhaler Inhale 2 puffs into the lungs every 6 (six) hours as needed for wheezing. 04/10/13 04/10/14 Yes Na Li, MD  alendronate (FOSAMAX) 70 MG tablet Take 1 tablet (70 mg total) by mouth every 7 (seven) days. Take with a full  glass of water on an empty stomach. Takes on Tues. 04/10/13  Yes Charlann Lange, MD  Calcium Carbonate-Vitamin D (CALCIUM-VITAMIN D) 500-200 MG-UNIT per tablet Take 1 tablet by mouth 2 (two) times daily. 04/10/13 04/10/14 Yes Na Li, MD  insulin glargine (LANTUS) 100 UNIT/ML injection Inject 34 Units into the skin every morning.   Yes Historical Provider, MD  losartan (COZAAR) 25 MG tablet Take 1 tablet (25 mg total) by mouth daily. 04/10/13  Yes Na Li, MD  Pancrelipase, Lip-Prot-Amyl, 24000 UNITS CPEP Take 2-3 capsules  (48,000-72,000 Units total) by mouth 3 (three) times daily. With meals and snacks. No more than 10/24hours 07/26/13  Yes Joni Reining, DO  polyethylene glycol (MIRALAX / GLYCOLAX) packet Take 17 g by mouth daily as needed. For constipation 04/10/13  Yes Na Nicoletta Dress, MD  travoprost, benzalkonium, (TRAVATAN) 0.004 % ophthalmic solution Place 1 drop into both eyes at bedtime.   Yes Historical Provider, MD   BP 155/67  Pulse 77  Temp(Src) 98.5 F (36.9 C) (Oral)  Resp 16  SpO2 100% Physical Exam  Nursing note and vitals reviewed. Constitutional: She is oriented to person, place, and time. She appears well-developed and well-nourished. No distress.  HENT:  Head: Normocephalic.  Eyes: Conjunctivae are normal.  Neck: Neck supple.  Cardiovascular: Normal rate, regular rhythm and normal heart sounds.   Pulmonary/Chest: Effort normal and breath sounds normal. No respiratory distress. She has no wheezes. She has no rales.  Abdominal: Soft. Bowel sounds are normal. She exhibits no distension. There is no tenderness. There is no rebound.  Musculoskeletal: She exhibits no edema.  No midline or paravertebral thoracic or lumbar spine tenderness. No pain with bilateral shoulder/arm movement. Pain with twisting of thorax and with bearing weight on arms. Dorsal pedal pulses intact and equal bilat.   Neurological: She is alert and oriented to person, place, and time.  5/5 and equal lower extremity strength. 2+ and equal patellar reflexes bilaterally. Pt able to dorsiflex bilateral toes and feet with good strength against resistance. Equal sensation bilaterally over thighs and lower legs.   Skin: Skin is warm and dry.  Psychiatric: She has a normal mood and affect. Her behavior is normal.    ED Course  Procedures (including critical care time) Labs Review Labs Reviewed  CBC WITH DIFFERENTIAL - Abnormal; Notable for the following:    WBC 3.8 (*)    RBC 3.77 (*)    Hemoglobin 11.4 (*)    HCT 34.7 (*)     Platelets 134 (*)    All other components within normal limits  COMPREHENSIVE METABOLIC PANEL - Abnormal; Notable for the following:    Glucose, Bld 201 (*)    BUN 47 (*)    Creatinine, Ser 1.31 (*)    Total Bilirubin 0.2 (*)    GFR calc non Af Amer 43 (*)    GFR calc Af Amer 50 (*)    All other components within normal limits  LIPASE, BLOOD - Abnormal; Notable for the following:    Lipase 7 (*)    All other components within normal limits  I-STAT TROPOININ, ED    Imaging Review Dg Chest 2 View  09/05/2013   CLINICAL DATA:  Back pain and chest pain.  EXAM: CHEST  2 VIEW  COMPARISON:  06/03/2012  FINDINGS: Heart size and pulmonary vascularity are normal. There is new linear atelectasis at the right lung base. There is stable linear scarring at the left lung base. Lungs are otherwise clear. No effusions.  No significant osseous abnormality.  IMPRESSION: New linear atelectasis at the right base.   Electronically Signed   By: Rozetta Nunnery M.D.   On: 09/05/2013 07:38     EKG Interpretation   Date/Time:  Tuesday September 05 2013 07:16:37 EDT Ventricular Rate:  72 PR Interval:  158 QRS Duration: 103 QT Interval:  420 QTC Calculation: 460 R Axis:   -10 Text Interpretation:  Sinus rhythm Left ventricular hypertrophy No  significant change since last tracing Confirmed by [place]  MD, STEPHEN  ([id number]) on 09/05/2013 9:37:10 AM      MDM   Final diagnoses:  Pain in thoracic spine    Pt in ED with complaint of thoracic spine back pain. Pain is reproducible with movement of the thorax. Her VS are normal, she is not tachycardic, not hypoxic, not on beta blockers, doubt PE. Low suspicion for dissection. Will get labs, CXR, trop, ecg. Pt in no distress, asking for coffee. Will try norco and valium for pain  9:20 AM Labs and CXR with no findings to explain pain. Pt has had an xray of thoracic spine 2 months ago showing osteoarthritis. Given presentation and hx suspect musculoskeletal pain. Discussed  with Dr. Wilson Singer who has seen pt and agrees. Pt's pain did not improve with medications. Pt does have hx of chronic pain and was referred to pain management based on notes review, however she was no show several times. Will prescribe norco, flexeril, follow up with PCP.   Filed Vitals:   09/05/13 0700 09/05/13 0800 09/05/13 0900 09/05/13 0915  BP: 150/69 160/64 174/78 156/67  Pulse: 71 65 68 64  Temp:      TempSrc:      Resp:  24 20 23   SpO2: 98% 96% 99% 97%       Roni Scow A Zaidy Absher, PA-C 09/05/13 1521

## 2013-09-05 NOTE — ED Notes (Signed)
Coffee given 

## 2013-09-05 NOTE — ED Notes (Signed)
MD aware pt's pain is unimproved.

## 2013-09-05 NOTE — Discharge Instructions (Signed)
Continue ibuprofen for pain. Take flexeril for spasms. Take norco for severe pain. Follow up with primary care doctor as soon as able for re evaluation and further treatment.    Back Pain, Adult Low back pain is very common. About 1 in 5 people have back pain.The cause of low back pain is rarely dangerous. The pain often gets better over time.About half of people with a sudden onset of back pain feel better in just 2 weeks. About 8 in 10 people feel better by 6 weeks.  CAUSES Some common causes of back pain include:  Strain of the muscles or ligaments supporting the spine.  Wear and tear (degeneration) of the spinal discs.  Arthritis.  Direct injury to the back. DIAGNOSIS Most of the time, the direct cause of low back pain is not known.However, back pain can be treated effectively even when the exact cause of the pain is unknown.Answering your caregiver's questions about your overall health and symptoms is one of the most accurate ways to make sure the cause of your pain is not dangerous. If your caregiver needs more information, he or she may order lab work or imaging tests (X-rays or MRIs).However, even if imaging tests show changes in your back, this usually does not require surgery. HOME CARE INSTRUCTIONS For many people, back pain returns.Since low back pain is rarely dangerous, it is often a condition that people can learn to [facility] their own.   Remain active. It is stressful on the back to sit or stand in one place. Do not sit, drive, or stand in one place for more than 30 minutes at a time. Take short walks on level surfaces as soon as pain allows.Try to increase the length of time you walk each day.  Do not stay in bed.Resting more than 1 or 2 days can delay your recovery.  Do not avoid exercise or work.Your body is made to move.It is not dangerous to be active, even though your back may hurt.Your back will likely heal faster if you return to being active before your  pain is gone.  Pay attention to your body when you bend and lift. Many people have less discomfortwhen lifting if they bend their knees, keep the load close to their bodies,and avoid twisting. Often, the most comfortable positions are those that put less stress on your recovering back.  Find a comfortable position to sleep. Use a firm mattress and lie on your side with your knees slightly bent. If you lie on your back, put a pillow under your knees.  Only take over-the-counter or prescription medicines as directed by your caregiver. Over-the-counter medicines to reduce pain and inflammation are often the most helpful.Your caregiver may prescribe muscle relaxant drugs.These medicines help dull your pain so you can more quickly return to your normal activities and healthy exercise.  Put ice on the injured area.  Put ice in a plastic bag.  Place a towel between your skin and the bag.  Leave the ice on for 15-20 minutes, 03-04 times a day for the first 2 to 3 days. After that, ice and heat may be alternated to reduce pain and spasms.  Ask your caregiver about trying back exercises and gentle massage. This may be of some benefit.  Avoid feeling anxious or stressed.Stress increases muscle tension and can worsen back pain.It is important to recognize when you are anxious or stressed and learn ways to manage it.Exercise is a great option. SEEK MEDICAL CARE IF:  You have pain  that is not relieved with rest or medicine.  You have pain that does not improve in 1 week.  You have new symptoms.  You are generally not feeling well. SEEK IMMEDIATE MEDICAL CARE IF:   You have pain that radiates from your back into your legs.  You develop new bowel or bladder control problems.  You have unusual weakness or numbness in your arms or legs.  You develop nausea or vomiting.  You develop abdominal pain.  You feel faint. Document Released: 02/09/2005 Document Revised: 08/11/2011 Document  Reviewed: 06/30/2010 [facility] Patient Information 2015 [place], [place]. This information is not intended to replace advice given to you by your health care provider. Make sure you discuss any questions you have with your health care provider.

## 2013-09-05 NOTE — ED Notes (Addendum)
Patient reports having mid back pain since Saturday that has progressively gotten worse. Patient has been taking extra strength Tylenol, but has had no relief. Has had this pain before and told it was a disc in her back. Ambulatory to room.

## 2013-09-06 ENCOUNTER — Encounter: Payer: Self-pay | Admitting: Podiatry

## 2013-09-06 ENCOUNTER — Ambulatory Visit (INDEPENDENT_AMBULATORY_CARE_PROVIDER_SITE_OTHER): Payer: Medicaid Other | Admitting: Podiatry

## 2013-09-06 VITALS — BP 128/56 | HR 68 | Temp 97.2°F | Resp 15 | Ht 65.0 in | Wt 186.0 lb

## 2013-09-06 DIAGNOSIS — L97509 Non-pressure chronic ulcer of other part of unspecified foot with unspecified severity: Secondary | ICD-10-CM

## 2013-09-06 NOTE — Patient Instructions (Signed)
Wear your diabetic shoes at all times Apply Silvadene to skin ulcer on the bottom of the right foot if you notice drainage

## 2013-09-07 LAB — HM DIABETES EYE EXAM

## 2013-09-07 NOTE — Progress Notes (Signed)
Patient ID: [name], female   DOB: 04-14-1950, 63 y.o.   MRN: [id number]  Subjective: This diabetic patient presents for ongoing care for skin ulcers and pre-ulcerative hyperkeratotic lesions  Objective: The plantar fifth right MPJ has hemorrhagic keratoses with fragile closure Plantar first MPJ has not hemorrhagic hyperkeratoses  The plantar first and fifth MPJ has not hemorrhagic keratoses  Assessment: Pre-ulcerative plantar keratoses fifth MPJ right Hyperkeratoses plantar first MPJ right and plantar first and fifth MPJ left  Plan: All 4 hyperkeratotic areas are debrided   Patient will apply Silvadene to the plantar fifth right MPJ she notices drainage Shoe wear her diabetic shoes with custom insoles  Reappoint x2 weeks

## 2013-09-07 NOTE — ED Provider Notes (Signed)
Medical screening examination/treatment/procedure(s) were performed by non-physician practitioner and as supervising physician I was immediately available for consultation/collaboration.   EKG Interpretation   Date/Time:  Tuesday September 05 2013 07:16:37 EDT Ventricular Rate:  72 PR Interval:  158 QRS Duration: 103 QT Interval:  420 QTC Calculation: 460 R Axis:   -10 Text Interpretation:  Sinus rhythm Left ventricular hypertrophy No  significant change since last tracing Confirmed by Wilson Singer  MD, Jyaire Koudelka  ([id number]) on 09/05/2013 9:37:10 AM       Virgel Manifold, MD 09/07/13 [phone]

## 2013-09-11 ENCOUNTER — Ambulatory Visit: Payer: Medicaid Other | Admitting: Internal Medicine

## 2013-09-20 ENCOUNTER — Ambulatory Visit (INDEPENDENT_AMBULATORY_CARE_PROVIDER_SITE_OTHER): Payer: Medicaid Other | Admitting: Podiatry

## 2013-09-20 ENCOUNTER — Encounter: Payer: Self-pay | Admitting: Podiatry

## 2013-09-20 VITALS — BP 130/70 | HR 74 | Resp 12

## 2013-09-20 DIAGNOSIS — L97509 Non-pressure chronic ulcer of other part of unspecified foot with unspecified severity: Secondary | ICD-10-CM

## 2013-09-20 NOTE — Patient Instructions (Signed)
Apply Silvadene cream to skin ulcer and right foot daily Wear surgical shoe on right foot

## 2013-09-21 ENCOUNTER — Encounter: Payer: Self-pay | Admitting: Podiatry

## 2013-09-21 NOTE — Progress Notes (Signed)
Patient ID: [name], female   DOB: August 11, 1950, 63 y.o.   MRN: [id number]  Subjective: This patient presents for ongoing care for diabetic skin ulcers  Objective: Plantar fifth right MPJ as a superficial skin ulcer with a red granular base 10 mm in diameter surrounded by hyperkeratotic tissue Plan keratoses right first MPJ  Left foot Plan keratoses first and fifth MPJ and left heel  Assessment: Superficial ulceration fifth right MPJ but clinically is not infected Preoperative hyperkeratoses right and left  Plan: The ulcer and keratoses debrided Maintain Silvadene dressing to the fifth right MPJ until the eschar forms Wear a surgical shoe on the right  Reappoint x2 weeks

## 2013-10-04 ENCOUNTER — Ambulatory Visit: Payer: Medicaid Other | Admitting: Podiatry

## 2013-10-18 ENCOUNTER — Encounter: Payer: Self-pay | Admitting: Podiatry

## 2013-10-18 ENCOUNTER — Ambulatory Visit (INDEPENDENT_AMBULATORY_CARE_PROVIDER_SITE_OTHER): Payer: Medicaid Other

## 2013-10-18 ENCOUNTER — Ambulatory Visit (INDEPENDENT_AMBULATORY_CARE_PROVIDER_SITE_OTHER): Payer: Medicaid Other | Admitting: Podiatry

## 2013-10-18 VITALS — BP 158/62 | HR 77 | Temp 97.6°F | Resp 15

## 2013-10-18 DIAGNOSIS — M25473 Effusion, unspecified ankle: Secondary | ICD-10-CM

## 2013-10-18 DIAGNOSIS — R609 Edema, unspecified: Secondary | ICD-10-CM

## 2013-10-18 DIAGNOSIS — M25476 Effusion, unspecified foot: Secondary | ICD-10-CM

## 2013-10-18 DIAGNOSIS — L97509 Non-pressure chronic ulcer of other part of unspecified foot with unspecified severity: Secondary | ICD-10-CM

## 2013-10-18 DIAGNOSIS — M25474 Effusion, right foot: Secondary | ICD-10-CM

## 2013-10-18 DIAGNOSIS — R0989 Other specified symptoms and signs involving the circulatory and respiratory systems: Secondary | ICD-10-CM

## 2013-10-18 NOTE — Progress Notes (Signed)
Patient ID: [name], female   DOB: 1950-08-08, 63 y.o.   MRN: [id number]  Subjective: This patient presents for ongoing care for diabetic skin ulcers. In addition for the past 3 days patient has noticed some swelling in the right foot without any direct injury.  Orientated x3 black female  Vascular: DPs are 2/4 bilaterally PT 1/4 right PT left 2/4  mild nonpitting edema noted dorsum right foot  No edema or calf pain right  Neurological deferred  Dermatological: Bleeding callus plantar fifth right MPJ post debridement remains close Plantar keratoses plantar right first MPJ  Plantar keratoses left first MPJ Nucleated keratoses left heel  Musculoskeletal: HAV deformities bilaterally Hammertoe second right  X-ray examination weightbearing right foot  Intact bony structure without fracture and/or dislocation  HAV deformity  Hammertoe second  Pes planus  Radiographic impression:  No acute bony abnormality noted in the right foot  Assessment: Edema undetermined origin Decrease right posterior tibial pulse rule out peripheral arterial disease Pre-ulcerative callus fifth right MPJ Hyperkeratoses First MPJ bilaterally  Plan: Pre-ulcerative callus and keratoses debrided Patient will walking Darco shoe with Plastizote insole on the right Patient referred to vascular lab for lower extremity arterial Doppler for the indication of edema, decrease right posterior tibial pulse and diabetes  Notify patient of vascular lab results  Reappoint x2 weeks or sooner if patient is concerned

## 2013-10-18 NOTE — Patient Instructions (Signed)
Apply Silvadene cream to sores in about or feet if any drainage is noted Wear surgical shoe on right foot The vascular lab we'll contact you to schedule a lower extremity arterial Doppler

## 2013-10-19 NOTE — Addendum Note (Signed)
Addended by: Lolita Rieger on: 10/19/2013 09:25 AM   Modules accepted: Orders

## 2013-10-20 ENCOUNTER — Telehealth (HOSPITAL_COMMUNITY): Payer: Self-pay | Admitting: *Deleted

## 2013-11-01 ENCOUNTER — Ambulatory Visit: Payer: Medicaid Other | Admitting: Podiatry

## 2013-11-03 ENCOUNTER — Ambulatory Visit (HOSPITAL_COMMUNITY)
Admission: RE | Admit: 2013-11-03 | Discharge: 2013-11-03 | Disposition: A | Discharge status: Home / Self Care | Payer: Medicaid Other | Source: Ambulatory Visit | Attending: Cardiology | Admitting: Cardiology

## 2013-11-03 DIAGNOSIS — E1169 Type 2 diabetes mellitus with other specified complication: Secondary | ICD-10-CM | POA: Insufficient documentation

## 2013-11-03 DIAGNOSIS — R609 Edema, unspecified: Secondary | ICD-10-CM

## 2013-11-03 DIAGNOSIS — R0989 Other specified symptoms and signs involving the circulatory and respiratory systems: Secondary | ICD-10-CM | POA: Insufficient documentation

## 2013-11-03 DIAGNOSIS — L97509 Non-pressure chronic ulcer of other part of unspecified foot with unspecified severity: Secondary | ICD-10-CM | POA: Diagnosis present

## 2013-11-03 DIAGNOSIS — I739 Peripheral vascular disease, unspecified: Secondary | ICD-10-CM

## 2013-11-03 NOTE — Progress Notes (Signed)
Arterial Lower Ext. Arterial Duplex Completed. Oda Cogan, BS, RDMS, RVT

## 2013-11-07 ENCOUNTER — Telehealth (HOSPITAL_COMMUNITY): Payer: Self-pay | Admitting: *Deleted

## 2013-11-08 ENCOUNTER — Encounter: Payer: Self-pay | Admitting: Podiatry

## 2013-11-08 ENCOUNTER — Telehealth: Payer: Self-pay | Admitting: *Deleted

## 2013-11-08 ENCOUNTER — Ambulatory Visit (INDEPENDENT_AMBULATORY_CARE_PROVIDER_SITE_OTHER): Payer: Medicaid Other | Admitting: Podiatry

## 2013-11-08 VITALS — BP 117/60 | HR 82 | Resp 12

## 2013-11-08 DIAGNOSIS — L97509 Non-pressure chronic ulcer of other part of unspecified foot with unspecified severity: Secondary | ICD-10-CM

## 2013-11-08 DIAGNOSIS — I739 Peripheral vascular disease, unspecified: Secondary | ICD-10-CM

## 2013-11-08 NOTE — Patient Instructions (Signed)
As the surgical shoe on the right foot Apply Silvadene cream if you notice strange on the skin ulcer on the right foot

## 2013-11-08 NOTE — Telephone Encounter (Signed)
I called and informed the patient that Dr. Amalia Hailey got her doppler results and it was abnormal.  He said there was some claudication but not severe.  He wants you to follow-up with Dr. Quay Burow at the same place where you had the doppler study.  She stated, "Okay, do I have to call them to get it scheduled?"  I told her I would make the referral and they will call you to get it scheduled.  She said, "Okay thank you."  I faxed a referral to Dr. Quay Burow at [facility] for a consultation, per Dr. Amalia Hailey.

## 2013-11-09 ENCOUNTER — Telehealth: Payer: Self-pay | Admitting: Cardiovascular Disease

## 2013-11-09 NOTE — Progress Notes (Signed)
Patient ID: [name], female   DOB: October 18, 1950, 64 y.o.   MRN: [id number]  Subjective: This patient presents for ongoing care for diabetic skin ulcers in pre-ulcerative callus bilaterally. In addition, I will review results of lower extremity arterial Doppler of 11/03/2013  Objective: Hemorrhagic callus 10 mm in diameter plantar fifth right MPJ Keratoses right first MPJ Keratoses left first MPJ  Results of lower extremity arterial Doppler of 10/03/2013 summary  right posterior tibial appears occluded Left SVA less than 50% diameter Left calf runoff 1 vessel runoff via peroneal artery. The distal posterior tibial artery at occluded EBI right 1.1 ABI left 0.89 Abnormal arterial Doppler  Assessment: Pre-ulcerative keratoses fifth right MPJ Keratoses right first MPJ and left first MPJ Abnormal arterial Doppler of 10/03/2013  Plan: The plantar skin lesions debrided right and left Patient advised to wear surgical shoe on right and apply Silvadene cream to right fifth MPJ if she notices drainage  Made patient aware that arterial Doppler was abnormal and I am recommending followup Our office will contact Dr. Gwenlyn Found and arrange a followup of the arterial Doppler of 10/03/2013  Reappoint x2 weeks

## 2013-11-10 ENCOUNTER — Other Ambulatory Visit: Payer: Self-pay | Admitting: *Deleted

## 2013-11-10 MED ORDER — INSULIN GLARGINE 100 UNIT/ML ~~LOC~~ SOLN: 34.0000 [IU] | SUBCUTANEOUS | Status: DC

## 2013-11-10 NOTE — Telephone Encounter (Signed)
Pt states she has been out for a month. Her cbg's are elevated to the 400's ( per patient) Can you refill now?

## 2013-11-10 NOTE — Telephone Encounter (Signed)
Closed encounter °

## 2013-11-16 ENCOUNTER — Encounter: Payer: Self-pay | Admitting: Internal Medicine

## 2013-11-16 ENCOUNTER — Ambulatory Visit: Payer: Medicaid Other | Admitting: Internal Medicine

## 2013-11-22 ENCOUNTER — Encounter: Payer: Self-pay | Admitting: Podiatry

## 2013-11-22 ENCOUNTER — Ambulatory Visit (INDEPENDENT_AMBULATORY_CARE_PROVIDER_SITE_OTHER): Payer: Medicaid Other | Admitting: Podiatry

## 2013-11-22 VITALS — BP 139/60 | HR 67 | Resp 12

## 2013-11-22 DIAGNOSIS — L97509 Non-pressure chronic ulcer of other part of unspecified foot with unspecified severity: Secondary | ICD-10-CM

## 2013-11-22 NOTE — Patient Instructions (Signed)
Wear surgical shoe on right foot Apply Silvadene cream to the skin ulcers on the right foot daily and cover with gauze

## 2013-11-23 ENCOUNTER — Telehealth: Payer: Self-pay | Admitting: *Deleted

## 2013-11-23 DIAGNOSIS — M810 Age-related osteoporosis without current pathological fracture: Secondary | ICD-10-CM

## 2013-11-23 DIAGNOSIS — I10 Essential (primary) hypertension: Secondary | ICD-10-CM

## 2013-11-23 MED ORDER — LOSARTAN POTASSIUM 25 MG PO TABS: 25.0000 mg | ORAL_TABLET | Freq: Every day | ORAL | Status: DC

## 2013-11-23 MED ORDER — ALENDRONATE SODIUM 70 MG PO TABS: 70.0000 mg | ORAL_TABLET | ORAL | Status: DC

## 2013-11-23 NOTE — Telephone Encounter (Signed)
[facility] is requesting alendronate 70mg  #4 and losartan 25mg  #30 - unable to use reorder button. Hilda Blades Tyjai Charbonnet RN 11/23/13 2:45PM

## 2013-11-23 NOTE — Progress Notes (Signed)
Patient ID: [name], female   DOB: [birth year], 63 y.o.   MRN: [id number]  Subjective: This patient presents for ongoing care for ulcerative and pre-ulcerative diabetic foot ulcers. She has a pending evaluation with Dr. Quay Burow for followup abnormal Doppler examination of 10/03/2013  Objective: The plantar fifth right MPJ as superficial ulceration with granular base surrounded by hyperkeratotic tissue Plantar keratoses right first MPJ  Left foot Plantar keratoses first and fifth left MPJ  Assessment: Peripheral arterial disease with pending evaluation with Dr. Quay Burow Superficial ulcer plantar fifth right MPJ Plantar callus first MPJ right and first and fifth MPJ left  Plan: Ulcer and hyperkeratotic tissue debrided  Silvadene cream and dressing to the ulcer fifth right MPJ Wear surgical shoe right and continue to apply Silvadene cream to the ulcer on the right foot daily  Reappoint x2 weeks

## 2013-12-01 ENCOUNTER — Observation Stay (HOSPITAL_COMMUNITY)
Admission: EM | Admit: 2013-12-01 | Discharge: 2013-12-02 | Disposition: A | Discharge status: Home / Self Care | Payer: Medicaid Other | Attending: Oncology | Admitting: Oncology

## 2013-12-01 ENCOUNTER — Encounter (HOSPITAL_COMMUNITY): Payer: Self-pay | Admitting: Emergency Medicine

## 2013-12-01 DIAGNOSIS — E1142 Type 2 diabetes mellitus with diabetic polyneuropathy: Secondary | ICD-10-CM | POA: Diagnosis not present

## 2013-12-01 DIAGNOSIS — E11319 Type 2 diabetes mellitus with unspecified diabetic retinopathy without macular edema: Secondary | ICD-10-CM | POA: Diagnosis not present

## 2013-12-01 DIAGNOSIS — E162 Hypoglycemia, unspecified: Secondary | ICD-10-CM | POA: Diagnosis present

## 2013-12-01 DIAGNOSIS — N183 Chronic kidney disease, stage 3 (moderate): Secondary | ICD-10-CM | POA: Diagnosis not present

## 2013-12-01 DIAGNOSIS — I739 Peripheral vascular disease, unspecified: Secondary | ICD-10-CM | POA: Diagnosis not present

## 2013-12-01 DIAGNOSIS — E11649 Type 2 diabetes mellitus with hypoglycemia without coma: Principal | ICD-10-CM | POA: Insufficient documentation

## 2013-12-01 DIAGNOSIS — F17200 Nicotine dependence, unspecified, uncomplicated: Secondary | ICD-10-CM | POA: Diagnosis not present

## 2013-12-01 DIAGNOSIS — I10 Essential (primary) hypertension: Secondary | ICD-10-CM

## 2013-12-01 DIAGNOSIS — M1990 Unspecified osteoarthritis, unspecified site: Secondary | ICD-10-CM | POA: Insufficient documentation

## 2013-12-01 DIAGNOSIS — I129 Hypertensive chronic kidney disease with stage 1 through stage 4 chronic kidney disease, or unspecified chronic kidney disease: Secondary | ICD-10-CM | POA: Diagnosis not present

## 2013-12-01 DIAGNOSIS — IMO0002 Reserved for concepts with insufficient information to code with codable children: Secondary | ICD-10-CM | POA: Diagnosis present

## 2013-12-01 DIAGNOSIS — K860 Alcohol-induced chronic pancreatitis: Secondary | ICD-10-CM | POA: Insufficient documentation

## 2013-12-01 DIAGNOSIS — Z794 Long term (current) use of insulin: Secondary | ICD-10-CM | POA: Insufficient documentation

## 2013-12-01 DIAGNOSIS — E1122 Type 2 diabetes mellitus with diabetic chronic kidney disease: Secondary | ICD-10-CM | POA: Diagnosis present

## 2013-12-01 DIAGNOSIS — E1165 Type 2 diabetes mellitus with hyperglycemia: Secondary | ICD-10-CM

## 2013-12-01 LAB — CBC WITH DIFFERENTIAL/PLATELET
BASOS PCT: 0 % (ref 0–1)
Basophils Absolute: 0 10*3/uL (ref 0.0–0.1)
Eosinophils Absolute: 0.2 10*3/uL (ref 0.0–0.7)
Eosinophils Relative: 3 % (ref 0–5)
HCT: 33 % — ABNORMAL LOW (ref 36.0–46.0)
HEMOGLOBIN: 11 g/dL — AB (ref 12.0–15.0)
LYMPHS PCT: 41 % (ref 12–46)
Lymphs Abs: 2 10*3/uL (ref 0.7–4.0)
MCH: 30.7 pg (ref 26.0–34.0)
MCHC: 33.3 g/dL (ref 30.0–36.0)
MCV: 92.2 fL (ref 78.0–100.0)
MONOS PCT: 6 % (ref 3–12)
Monocytes Absolute: 0.3 10*3/uL (ref 0.1–1.0)
NEUTROS ABS: 2.4 10*3/uL (ref 1.7–7.7)
NEUTROS PCT: 50 % (ref 43–77)
Platelets: 144 10*3/uL — ABNORMAL LOW (ref 150–400)
RBC: 3.58 MIL/uL — ABNORMAL LOW (ref 3.87–5.11)
RDW: 14.8 % (ref 11.5–15.5)
WBC: 4.8 10*3/uL (ref 4.0–10.5)

## 2013-12-01 LAB — COMPREHENSIVE METABOLIC PANEL
ALT: 35 U/L (ref 0–35)
ANION GAP: 10 (ref 5–15)
AST: 39 U/L — ABNORMAL HIGH (ref 0–37)
Albumin: 3.5 g/dL (ref 3.5–5.2)
Alkaline Phosphatase: 74 U/L (ref 39–117)
BUN: 32 mg/dL — AB (ref 6–23)
CALCIUM: 8.7 mg/dL (ref 8.4–10.5)
CO2: 24 mEq/L (ref 19–32)
CREATININE: 1.49 mg/dL — AB (ref 0.50–1.10)
Chloride: 105 mEq/L (ref 96–112)
GFR calc non Af Amer: 37 mL/min — ABNORMAL LOW (ref >90)
GFR, EST AFRICAN AMERICAN: 43 mL/min — AB (ref >90)
GLUCOSE: 54 mg/dL — AB (ref 70–99)
POTASSIUM: 4.6 meq/L (ref 3.7–5.3)
Sodium: 139 mEq/L (ref 137–147)
TOTAL PROTEIN: 7.5 g/dL (ref 6.0–8.3)
Total Bilirubin: <0.2 mg/dL — ABNORMAL LOW (ref 0.3–1.2)

## 2013-12-01 LAB — CBG MONITORING, ED
GLUCOSE-CAPILLARY: 215 mg/dL — AB (ref 70–99)
GLUCOSE-CAPILLARY: 125 mg/dL — AB (ref 70–99)
Glucose-Capillary: 55 mg/dL — ABNORMAL LOW (ref 70–99)
Glucose-Capillary: 141 mg/dL — ABNORMAL HIGH (ref 70–99)

## 2013-12-01 LAB — GLUCOSE, CAPILLARY: Glucose-Capillary: 150 mg/dL — ABNORMAL HIGH (ref 70–99)

## 2013-12-01 LAB — C-PEPTIDE: C-Peptide: 0.4 ng/mL — ABNORMAL LOW (ref 0.80–3.90)

## 2013-12-01 MED ORDER — LOSARTAN POTASSIUM 25 MG PO TABS
25.0000 mg | ORAL_TABLET | Freq: Every day | ORAL | Status: DC
  Administered 2013-12-02: 25 mg via ORAL
  Filled 2013-12-01: qty 1

## 2013-12-01 MED ORDER — ALBUTEROL SULFATE HFA 108 (90 BASE) MCG/ACT IN AERS: 2.0000 | INHALATION_SPRAY | Freq: Four times a day (QID) | RESPIRATORY_TRACT | Status: DC | PRN

## 2013-12-01 MED ORDER — HEPARIN SODIUM (PORCINE) 5000 UNIT/ML IJ SOLN
5000.0000 [IU] | Freq: Three times a day (TID) | INTRAMUSCULAR | Status: DC
  Administered 2013-12-02 (×3): 5000 [IU] via SUBCUTANEOUS
  Filled 2013-12-01 (×5): qty 1

## 2013-12-01 MED ORDER — LOSARTAN POTASSIUM 25 MG PO TABS
25.0000 mg | ORAL_TABLET | Freq: Once | ORAL | Status: AC
  Administered 2013-12-01: 25 mg via ORAL
  Filled 2013-12-01: qty 1

## 2013-12-01 MED ORDER — CALCIUM-VITAMIN D 500-200 MG-UNIT PO TABS: 1.0000 | ORAL_TABLET | Freq: Two times a day (BID) | ORAL | Status: DC

## 2013-12-01 MED ORDER — ALBUTEROL SULFATE (2.5 MG/3ML) 0.083% IN NEBU: 2.5000 mg | INHALATION_SOLUTION | Freq: Four times a day (QID) | RESPIRATORY_TRACT | Status: DC | PRN

## 2013-12-01 MED ORDER — PANCRELIPASE (LIP-PROT-AMYL) 12000-38000 UNITS PO CPEP
24000.0000 [IU] | ORAL_CAPSULE | Freq: Three times a day (TID) | ORAL | Status: DC
  Administered 2013-12-02 (×2): 24000 [IU] via ORAL
  Filled 2013-12-01 (×4): qty 2

## 2013-12-01 MED ORDER — INFLUENZA VAC SPLIT QUAD 0.5 ML IM SUSY
0.5000 mL | PREFILLED_SYRINGE | INTRAMUSCULAR | Status: AC
  Administered 2013-12-02: 0.5 mL via INTRAMUSCULAR
  Filled 2013-12-01: qty 0.5

## 2013-12-01 MED ORDER — TRAVOPROST 0.004 % OP SOLN: 1.0000 [drp] | Freq: Every day | OPHTHALMIC | Status: DC

## 2013-12-01 MED ORDER — TRAVOPROST (BAK FREE) 0.004 % OP SOLN
1.0000 [drp] | Freq: Every day | OPHTHALMIC | Status: DC
  Administered 2013-12-01: 1 [drp] via OPHTHALMIC
  Filled 2013-12-01: qty 2.5

## 2013-12-01 MED ORDER — PNEUMOCOCCAL VAC POLYVALENT 25 MCG/0.5ML IJ INJ
0.5000 mL | INJECTION | INTRAMUSCULAR | Status: AC
  Administered 2013-12-02: 0.5 mL via INTRAMUSCULAR
  Filled 2013-12-01: qty 0.5

## 2013-12-01 MED ORDER — CALCIUM CARBONATE-VITAMIN D 500-200 MG-UNIT PO TABS
1.0000 | ORAL_TABLET | Freq: Two times a day (BID) | ORAL | Status: DC
  Administered 2013-12-01 – 2013-12-02 (×2): 1 via ORAL
  Filled 2013-12-01 (×3): qty 1

## 2013-12-01 MED ORDER — POLYETHYLENE GLYCOL 3350 17 G PO PACK
17.0000 g | PACK | Freq: Every day | ORAL | Status: DC | PRN
  Filled 2013-12-01: qty 1

## 2013-12-01 NOTE — Progress Notes (Signed)
Pt arrived to unit via stretcher from ED. Pt VS were taken and are stable other than an elevated bp. Pt oriented to room and call-bell system. Pt in no apparent distress. Safety Video has been watched. Will continue to monitor pt

## 2013-12-01 NOTE — ED Notes (Signed)
Pt arrives POV from home with recent low blood sugars. States yesterday when she woke up it was 49. Today was as low as 73. Denies recent illness. Reports chronic back pain. CBG here 215. Pt awake, alert, oriented x4, VSS.

## 2013-12-01 NOTE — ED Notes (Signed)
Attempted report 

## 2013-12-01 NOTE — ED Notes (Signed)
Pt given sandwich, crackers, peanut butter- Dr. Lenna Sciara at bedside.

## 2013-12-01 NOTE — Progress Notes (Signed)
Received report. No recent CBG for patient. Asked ED nurse to recheck CBG & verify with ED physician any medications for pt's elevated bp before bringing pt to floor

## 2013-12-01 NOTE — ED Notes (Signed)
CBG = 55. Dr. Cathleen Fears informed and at bedside.

## 2013-12-01 NOTE — ED Provider Notes (Signed)
CSN: [id number]     Arrival date & time 12/01/13  1158 History   First MD Initiated Contact with Patient 12/01/13 1520     Chief Complaint  Patient presents with  . Hypoglycemia     (Consider location/radiation/quality/duration/timing/severity/associated sxs/prior Treatment) HPI Agent is awake and daily for the past 9 days with low blood sugars in the 40s. She denies changes in her medications. She states she checks blood sugars several times per day. Each morning blood sugar is low. She has cut back her insulin dosage from Lantus 34 units which he takes normally to 15 units 2 days ago. She took 25 units this morning. She presently feels mildly lightheaded. She denies vomiting denies fever denies other associated symptoms. Denies change in her medications. Past Medical History  Diagnosis Date  . Diabetes mellitus   . Hypertension   . ANEMIA, CHRONIC DISEASE NEC 03/15/2006  . DEGENERATIVE JOINT DISEASE 05/06/2007    On going for >5 yrs No imaging to confirm Has tried ultram, mobic, neurontin which did not work Ambulance person tried once worked well    . 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 DIABETIC  RETINOPATHY 03/15/2006  . Hyperlipidemia 04/01/2011  . PANCREATITIS, CHRONIC 03/29/2006  . RENAL INSUFFICIENCY, CHRONIC 03/15/2006   Past Surgical History  Procedure Laterality Date  . Breast surgery    . Mouth surgery N/A    History reviewed. No pertinent family history. History  Substance Use Topics  . Smoking status: Current Some Day Smoker -- 0.50 packs/day    Types: Cigarettes  . Smokeless tobacco: Not on file     Comment: Quit Line - wants to know if can get some patches.SMOKES WHENEVER SHE GET THEM  . Alcohol Use: 0.0 oz/week     Comment: daily   OB History   Grav Para Term Preterm Abortions TAB SAB Ect Mult Living                 Review of Systems  Constitutional: Negative.   HENT: Negative.   Respiratory: Negative.   Cardiovascular: Negative.   Gastrointestinal: Negative.    Musculoskeletal: Negative.   Skin: Positive for wound.       Ulcer plantar surface right foot  Allergic/Immunologic: Positive for immunocompromised state.       Diabetic  Neurological: Positive for light-headedness.  Psychiatric/Behavioral: Negative.   All other systems reviewed and are negative.     Allergies  Review of patient's allergies indicates no known allergies.  Home Medications   Prior to Admission medications   Medication Sig Start Date End Date Taking? Authorizing Provider  albuterol (PROVENTIL HFA) 108 (90 BASE) MCG/ACT inhaler Inhale 2 puffs into the lungs every 6 (six) hours as needed for wheezing. 04/10/13 04/10/14 Yes Na Li, MD  alendronate (FOSAMAX) 70 MG tablet Take 1 tablet (70 mg total) by mouth every 7 (seven) days. Take with a full glass of water on an empty stomach. Takes on Tues. 11/23/13  Yes Lucious Groves, DO  Calcium Carbonate-Vitamin D (CALCIUM-VITAMIN D) 500-200 MG-UNIT per tablet Take 1 tablet by mouth 2 (two) times daily. 04/10/13 04/10/14 Yes Na Li, MD  insulin glargine (LANTUS) 100 UNIT/ML injection Inject 0.34 mLs (34 Units total) into the skin every morning. 11/10/13  Yes Nischal Dareen Piano, MD  losartan (COZAAR) 25 MG tablet Take 1 tablet (25 mg total) by mouth daily. 11/23/13  Yes Lucious Groves, DO  Pancrelipase, Lip-Prot-Amyl, 24000 UNITS CPEP Take 2-3 capsules (48,000-72,000 Units total) by mouth 3 (three) times daily.  With meals and snacks. No more than 10/24hours 07/26/13  Yes Lucious Groves, DO  polyethylene glycol (MIRALAX / GLYCOLAX) packet Take 17 g by mouth daily as needed. For constipation 04/10/13  Yes Na Nicoletta Dress, MD  travoprost, benzalkonium, (TRAVATAN) 0.004 % ophthalmic solution Place 1 drop into both eyes at bedtime.   Yes Historical Provider, MD   BP 174/66  Pulse 84  Temp(Src) 98.6 F (37 C) (Oral)  Resp 18  Wt 190 lb (86.183 kg)  SpO2 95% Physical Exam  Nursing note and vitals reviewed. Constitutional: She is oriented to person, place,  and time. She appears well-developed and well-nourished.  HENT:  Head: Normocephalic and atraumatic.  Eyes: Conjunctivae are normal. Pupils are equal, round, and reactive to light.  Neck: Neck supple. No tracheal deviation present. No thyromegaly present.  Cardiovascular: Normal rate and regular rhythm.   No murmur heard. Pulmonary/Chest: Effort normal and breath sounds normal.  Abdominal: Soft. Bowel sounds are normal. She exhibits no distension. There is no tenderness.  Musculoskeletal: Normal range of motion. She exhibits no edema and no tenderness.  Neurological: She is alert and oriented to person, place, and time. No cranial nerve deficit. Coordination normal.  Glasgow Coma Score 15  Skin: Skin is warm and dry. No rash noted.  Dime sized hyperpigmented area at right foot. Suggestive of healed ulcer  Psychiatric: She has a normal mood and affect.    ED Course  Procedures (including critical care time) Labs Review Labs Reviewed  CBG MONITORING, ED - Abnormal; Notable for the following:    Glucose-Capillary 215 (*)    All other components within normal limits    Imaging Review No results found.   EKG Interpretation None     Patient given meal which she ate in its entirety. At 4:30 PM she is asymptomatic. Alert Glasgow Coma Score 15 Results for orders placed during the hospital encounter of 12/01/13  COMPREHENSIVE METABOLIC PANEL      Result Value Ref Range   Sodium 139  137 - 147 mEq/L   Potassium 4.6  3.7 - 5.3 mEq/L   Chloride 105  96 - 112 mEq/L   CO2 24  19 - 32 mEq/L   Glucose, Bld 54 (*) 70 - 99 mg/dL   BUN 32 (*) 6 - 23 mg/dL   Creatinine, Ser 1.49 (*) 0.50 - 1.10 mg/dL   Calcium 8.7  8.4 - 10.5 mg/dL   Total Protein 7.5  6.0 - 8.3 g/dL   Albumin 3.5  3.5 - 5.2 g/dL   AST 39 (*) 0 - 37 U/L   ALT 35  0 - 35 U/L   Alkaline Phosphatase 74  39 - 117 U/L   Total Bilirubin <0.2 (*) 0.3 - 1.2 mg/dL   GFR calc non Af Amer 37 (*) >90 mL/min   GFR calc Af Amer 43  (*) >90 mL/min   Anion gap 10  5 - 15  CBC WITH DIFFERENTIAL      Result Value Ref Range   WBC 4.8  4.0 - 10.5 K/uL   RBC 3.58 (*) 3.87 - 5.11 MIL/uL   Hemoglobin 11.0 (*) 12.0 - 15.0 g/dL   HCT 33.0 (*) 36.0 - 46.0 %   MCV 92.2  78.0 - 100.0 fL   MCH 30.7  26.0 - 34.0 pg   MCHC 33.3  30.0 - 36.0 g/dL   RDW 14.8  11.5 - 15.5 %   Platelets 144 (*) 150 - 400 K/uL   Neutrophils  Relative % 50  43 - 77 %   Neutro Abs 2.4  1.7 - 7.7 K/uL   Lymphocytes Relative 41  12 - 46 %   Lymphs Abs 2.0  0.7 - 4.0 K/uL   Monocytes Relative 6  3 - 12 %   Monocytes Absolute 0.3  0.1 - 1.0 K/uL   Eosinophils Relative 3  0 - 5 %   Eosinophils Absolute 0.2  0.0 - 0.7 K/uL   Basophils Relative 0  0 - 1 %   Basophils Absolute 0.0  0.0 - 0.1 K/uL  CBG MONITORING, ED      Result Value Ref Range   Glucose-Capillary 215 (*) 70 - 99 mg/dL  CBG MONITORING, ED      Result Value Ref Range   Glucose-Capillary 55 (*) 70 - 99 mg/dL  CBG MONITORING, ED      Result Value Ref Range   Glucose-Capillary 125 (*) 70 - 99 mg/dL  CBG MONITORING, ED      Result Value Ref Range   Glucose-Capillary 141 (*) 70 - 99 mg/dL   No results found.  MDM  Spoke with Dr. Redmond Pulling from outpatient clinic who will come to evaluate patient renal insufficiency is chronic Final diagnoses:  None   internal medicine service made arrangements for inpatient stay, plan glycemic management,  Dx #1 hypoglycemia #2 chronic renal insufficiency      Orlie Dakin, MD 12/01/13 1845

## 2013-12-01 NOTE — H&P (Signed)
Date: 12/01/2013               Patient Name:  [name] MRN: [id number]  DOB: [birth year] Age / Sex: 63 y.o., female   PCP: Lucious Groves, DO         Medical Service: Internal Medicine Teaching Service         Attending Physician: Dr. Murriel Hopper    First Contact: Dr. Venita Lick Pager: [phone]  Second Contact: Dr. 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 She reports that she used to take 34 units of Lantus every morning without any issues. However, starting about a week ago, she experienced very low finger stick readings in the morning. She typically gets up very early and takes her blood sugar at 6:00AM. For the past week, her levels were in the 50s-80s. Two days ago, she adjusted her Lantus dose to 15 units. She has continued to manipulate her dosing accordingly. However, this morning, one of her FS after 25 units of Lantus was 85 and did not respond to juice (in fact, she fell to 73); so, she came into the ED for help. Of note, during her hypoglycemic episodes, she "feels bad" and sweats profusely. She denies anxiety, hunger, shaking, vomiting, fever, changes in vision, confusion, changes in appetite or diet, or initiation/changes in any other medications. She has not been exercising more than at baseline. She is confident that she administers her medications accurately and states that she can see well enough to draw up her Lantus without any problems.  Of note, among many cancellations, [name]'s most recent PCP appointment  was in 03/2013. Notes at that time suggest noncompliance with insulin regimen.   Meds: No current facility-administered medications for this encounter.   Current Outpatient Prescriptions  Medication Sig Dispense Refill  . albuterol (PROVENTIL HFA) 108 (90 BASE) MCG/ACT inhaler Inhale 2 puffs into the lungs every 6 (six) hours as needed for wheezing.  6.7 g  6  . alendronate (FOSAMAX) 70 MG tablet Take 1 tablet (70 mg total) by mouth every 7 (seven) days. Take with a full glass of water on an empty stomach. Takes on Tues.  4 tablet  0  . Calcium Carbonate-Vitamin D (CALCIUM-VITAMIN D) 500-200 MG-UNIT per tablet Take 1 tablet by mouth 2 (two) times daily.  60 tablet  6  . insulin glargine (LANTUS) 100 UNIT/ML injection Inject 0.34 mLs (34 Units total) into the skin every morning.  10 mL  2  . losartan (COZAAR) 25 MG tablet Take 1 tablet (25 mg total) by mouth daily.  30 tablet  0  . Pancrelipase, Lip-Prot-Amyl, 24000 UNITS CPEP Take 2-3 capsules (48,000-72,000 Units total) by mouth 3 (three) times daily. With meals and snacks. No more than 10/24hours  180 capsule  11  . polyethylene glycol (MIRALAX / GLYCOLAX) packet Take 17 g by mouth daily as needed. For constipation  14 each  6  . travoprost, benzalkonium, (TRAVATAN) 0.004 % ophthalmic solution Place 1 drop into both eyes at  bedtime.        Allergies: Allergies as of 12/01/2013  . (No Known Allergies)   Past Medical History  Diagnosis Date  . Diabetes mellitus   . Hypertension   . ANEMIA, CHRONIC DISEASE NEC 03/15/2006  . DEGENERATIVE JOINT DISEASE 05/06/2007    On going for >5 yrs No imaging to confirm Has tried ultram, mobic, neurontin which did not work Ambulance person tried once worked well    . 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 DIABETIC  RETINOPATHY 03/15/2006  . Hyperlipidemia 04/01/2011  . PANCREATITIS, CHRONIC 03/29/2006  . RENAL INSUFFICIENCY, CHRONIC 03/15/2006   Past Surgical History  Procedure Laterality Date  . Breast  surgery    . Mouth surgery N/A    History reviewed. No pertinent family history. History   Social History  . Marital Status: Single    Spouse Name: N/A    Number of Children: N/A  . Years of Education: N/A   Occupational History  . Not on file.   Social History Main Topics  . Smoking status: Current Some Day Smoker -- 0.50 packs/day    Types: Cigarettes  . Smokeless tobacco: Not on file     Comment: Quit Line - wants to know if can get some patches.SMOKES WHENEVER SHE GET THEM  . Alcohol Use: 0.0 oz/week     Comment: daily  . Drug Use: No  . Sexual Activity: Not on file   Other Topics Concern  . Not on file   Social History Narrative  . No narrative on file    Review of Systems: Pertinent items are noted in HPI.  Physical Exam: Blood pressure 175/70, pulse 58, temperature 98.6 F (37 C), temperature source Oral, resp. rate 20, weight 190 lb (86.183 kg), SpO2 98.00%. Appearance: in NAD, sitting up in bed watching TV HEENT: AT/[place], PERRL, EOMi, no lymphadenopathy Heart: RRR, normal S1S2 with grade 3 holosystolic murmur at apex Lungs: CTAB, no wheezes Abdomen: BS+, soft, nontender Extremities: healed ulcer plantar surface right foot Neurologic: CN II-XII intact, sensation and strength grossly intact Skin: no lesions or rashes  Lab results: Basic Metabolic Panel:  Recent Labs  12/01/13 1536  NA 139  K 4.6  CL 105  CO2 24  GLUCOSE 54*  BUN 32*  CREATININE 1.49*  CALCIUM 8.7   Liver Function Tests:  Recent Labs  12/01/13 1536  AST 39*  ALT 35  ALKPHOS 74  BILITOT <0.2*  PROT 7.5  ALBUMIN 3.5   CBC:  Recent Labs  12/01/13 1536  WBC 4.8  NEUTROABS 2.4  HGB 11.0*  HCT 33.0*  MCV 92.2  PLT 144*   CBG:  Recent Labs  12/01/13 1220 12/01/13 1526 12/01/13 1617 12/01/13 1637  GLUCAP 215* 55* 125* 141*   Urine Drug Screen: Drugs of Abuse     Component Value Date/Time   LABOPIA NEG 09/25/2008 2042   COCAINSCRNUR NEG 09/25/2008 2042    LABBENZ NEG 09/25/2008 2042   AMPHETMU NEG 09/25/2008 2042     Assessment & Plan by Problem: Active Problems:   Hypoglycemia [name] is a 63 yo woman with DMII who has been struggling with hypoglycemic episodes on her home Lantus.   Recurrent Hypoglycemia: Her hypoglycemic episodes seem to be occurring in the morning on a regular basis. Hypoglycemia is more common in patients who take long-acting insulin such as Lantus. Risk of hypoglycemia does increase with advancing age; interestingly, older adults tend to have more neuroglycopenic manifestations of hypoglycemia (dizziness, weakness, delirium, confusion) compared  with adrenergic manifestations (tremors, sweating); she reports the opposite. This could be a nocturnal dip in glucose, as is sometimes noted in diabetics. It is possible that she needs a Lantus dose adjustment, that her compliance has varied, or that she has something more rare such as an insulinoma. - Creon as per home medications - CBG monitoring; will trend - HgbA1c - Sulfonylurea hypoglycemics panel - C-peptide - CMP and CBC  Hypertension:  - Continue home Cozaar  DVT Ppx:  - [place] heparin and SCDs  Diet: carb mod  Dispo: Disposition is deferred at this time, awaiting improvement of current medical problems. Anticipated discharge in approximately 1 day(s).   The patient does have a current PCP Lucious Groves, DO) and does not need an [facility] hospital follow-up appointment after discharge.  The patient does not have transportation limitations that hinder transportation to clinic appointments.  Signed: Drucilla Schmidt, MD 12/01/2013, 7:22 PM

## 2013-12-01 NOTE — Progress Notes (Addendum)
Paged provider about elevated bp. Will await further orders and continue to monitor pt. Pt to get 25 mg of cozaar po. bp rechecked and down 152/750  At 0200

## 2013-12-01 NOTE — ED Notes (Signed)
Orange Juice and Lunch given per MD.

## 2013-12-01 NOTE — ED Notes (Signed)
PT monitored by pulse ox, bp cuff, and 5-lead. 

## 2013-12-02 DIAGNOSIS — E11319 Type 2 diabetes mellitus with unspecified diabetic retinopathy without macular edema: Secondary | ICD-10-CM

## 2013-12-02 DIAGNOSIS — N183 Chronic kidney disease, stage 3 (moderate): Secondary | ICD-10-CM

## 2013-12-02 LAB — CBC
HCT: 31.4 % — ABNORMAL LOW (ref 36.0–46.0)
HEMOGLOBIN: 10.4 g/dL — AB (ref 12.0–15.0)
MCH: 29.8 pg (ref 26.0–34.0)
MCHC: 33.1 g/dL (ref 30.0–36.0)
MCV: 90 fL (ref 78.0–100.0)
Platelets: 126 10*3/uL — ABNORMAL LOW (ref 150–400)
RBC: 3.49 MIL/uL — AB (ref 3.87–5.11)
RDW: 14.8 % (ref 11.5–15.5)
WBC: 4 10*3/uL (ref 4.0–10.5)

## 2013-12-02 LAB — COMPREHENSIVE METABOLIC PANEL
ALBUMIN: 3 g/dL — AB (ref 3.5–5.2)
ALT: 33 U/L (ref 0–35)
AST: 43 U/L — AB (ref 0–37)
Alkaline Phosphatase: 65 U/L (ref 39–117)
Anion gap: 12 (ref 5–15)
BUN: 28 mg/dL — ABNORMAL HIGH (ref 6–23)
CHLORIDE: 106 meq/L (ref 96–112)
CO2: 22 mEq/L (ref 19–32)
CREATININE: 1.1 mg/dL (ref 0.50–1.10)
Calcium: 8.8 mg/dL (ref 8.4–10.5)
GFR calc Af Amer: 61 mL/min — ABNORMAL LOW (ref >90)
GFR calc non Af Amer: 53 mL/min — ABNORMAL LOW (ref >90)
Glucose, Bld: 195 mg/dL — ABNORMAL HIGH (ref 70–99)
Potassium: 4.1 mEq/L (ref 3.7–5.3)
Sodium: 140 mEq/L (ref 137–147)
Total Bilirubin: <0.2 mg/dL — ABNORMAL LOW (ref 0.3–1.2)
Total Protein: 6.6 g/dL (ref 6.0–8.3)

## 2013-12-02 LAB — GLUCOSE, CAPILLARY
GLUCOSE-CAPILLARY: 66 mg/dL — AB (ref 70–99)
Glucose-Capillary: 164 mg/dL — ABNORMAL HIGH (ref 70–99)
Glucose-Capillary: 53 mg/dL — ABNORMAL LOW (ref 70–99)
Glucose-Capillary: 107 mg/dL — ABNORMAL HIGH (ref 70–99)
Glucose-Capillary: 119 mg/dL — ABNORMAL HIGH (ref 70–99)
Glucose-Capillary: 152 mg/dL — ABNORMAL HIGH (ref 70–99)

## 2013-12-02 LAB — HEMOGLOBIN A1C
HEMOGLOBIN A1C: 7.4 % — AB (ref <5.7)
MEAN PLASMA GLUCOSE: 166 mg/dL — AB (ref <117)

## 2013-12-02 MED ORDER — LOSARTAN POTASSIUM 50 MG PO TABS
50.0000 mg | ORAL_TABLET | Freq: Every day | ORAL | Status: DC
Start: 2013-12-02 — End: 2013-12-02

## 2013-12-02 MED ORDER — GUAIFENESIN-DM 100-10 MG/5ML PO SYRP
5.0000 mL | ORAL_SOLUTION | ORAL | Status: DC | PRN
  Administered 2013-12-02: 5 mL via ORAL
  Filled 2013-12-02: qty 5

## 2013-12-02 MED ORDER — LOSARTAN POTASSIUM 25 MG PO TABS
25.0000 mg | ORAL_TABLET | Freq: Once | ORAL | Status: DC
  Filled 2013-12-02: qty 1

## 2013-12-02 MED ORDER — LOSARTAN POTASSIUM 25 MG PO TABS: 25.0000 mg | ORAL_TABLET | Freq: Once | ORAL | Status: DC

## 2013-12-02 MED ORDER — PHENYLEPHRINE HCL 0.25 % NA SOLN
1.0000 | Freq: Four times a day (QID) | NASAL | Status: DC | PRN
  Administered 2013-12-02: 1 via NASAL
  Filled 2013-12-02: qty 15

## 2013-12-02 NOTE — Discharge Instructions (Signed)
Please stop taking your insulin.   Keep close record of your finger sticks over the next few days. The clinic will be calling you to schedule an appointment early next week to evaluate whether you need to be restarted on any medications for your diabetes.  If you feel dizzy, sweaty or confused, or if your low blood sugar levels are not being corrected with juice and food (as they were in the hospital), please return to the hospital or call the clinic.  It will also help your sugars if you stop drinking alcohol.    Your blood pressures were also running high on this admission; your dose of Cozaar was kept at 25 mg daily, but your primary doctor may increase this dose next week.  Low Blood Sugar Low blood sugar (hypoglycemia) means that the level of sugar in your blood is lower than it should be. Signs of low blood sugar include:  Getting sweaty.  Feeling hungry.  Feeling dizzy or weak.  Feeling sleepier than normal.  Feeling nervous.  Headaches.  Having a fast heartbeat. Low blood sugar can happen fast and can be an emergency. Your doctor can do tests to check your blood sugar level. You can have low blood sugar and not have diabetes. HOME CARE  Check your blood sugar as told by your doctor. If it is less than 70 mg/dl or as told by your doctor, take 1 of the following:  3 to 4 glucose tablets.   cup clear juice.   cup soda pop, not diet.  1 cup milk.  5 to 6 hard candies.  Recheck blood sugar after 15 minutes. Repeat until it is at the right level.  Eat a snack if it is more than 1 hour until the next meal.  Only take medicine as told by your doctor.  Do not skip meals. Eat on time.  Do not drink alcohol except with meals.  Check your blood glucose before driving.  Check your blood glucose before and after exercise.  Always carry treatment with you, such as glucose pills.  Always wear a medical alert bracelet if you have diabetes. GET HELP RIGHT AWAY IF:     Your blood glucose goes below 70 mg/dl or as told by your doctor, and you:  Are confused.  Are not able to swallow.  Pass out (faint).  You cannot treat yourself. You may need someone to help you.  You have low blood sugar problems often.  You have problems from your medicines.  You are not feeling better after 3 to 4 days.  You have vision changes. MAKE SURE YOU:   Understand these instructions.  Will watch this condition.  Will get help right away if you are not doing well or get worse. Document Released: 05/06/2009 Document Revised: 05/04/2011 Document Reviewed: 05/06/2009 [facility] Patient Information 2015 [place], [place]. This information is not intended to replace advice given to you by your health care provider. Make sure you discuss any questions you have with your health care provider.

## 2013-12-02 NOTE — Progress Notes (Signed)
Subjective: [name] feels better this morning. She did have one episode of sweating, but felt better after eating some graham crackers and drinking her juice. She ate a full breakfast this morning as well. She would like to go home later today.  Objective: Vital signs in last 24 hours: Filed Vitals:   12/01/13 2052 12/01/13 2358 12/02/13 0200 12/02/13 0509  BP: 184/68 148/52 157/72 167/58  Pulse: 72 70 70 58  Temp: 97.8 F (36.6 C)   98.2 F (36.8 C)  TempSrc: Oral   Oral  Resp: 18   16  Height: 5\' 5"  (1.651 m)     Weight: 186 lb 14.4 oz (84.777 kg)     SpO2: 100%   99%   Weight change:  No intake or output data in the 24 hours ending 12/02/13 1108  Physical Exam: Appearance: in NAD, sitting up in bed watching TV, juices at bedside, no diaphoresis HEENT: AT/, PERRL, EOMi, no lymphadenopathy  Heart: RRR, normal S1S2 with grade 3 holosystolic murmur at apex  Lungs: CTAB, no wheezes  Abdomen: BS+, soft, nontender  Extremities: healed ulcer plantar surface right foot  Neurologic: CN II-XII intact, sensation and strength grossly intact  Skin: no lesions or rashes  Lab Results: Basic Metabolic Panel:  Recent Labs Lab 12/01/13 1536 12/02/13 0325  NA 139 140  K 4.6 4.1  CL 105 106  CO2 24 22  GLUCOSE 54* 195*  BUN 32* 28*  CREATININE 1.49* 1.10  CALCIUM 8.7 8.8   Liver Function Tests:  Recent Labs Lab 12/01/13 1536 12/02/13 0325  AST 39* 43*  ALT 35 33  ALKPHOS 74 65  BILITOT <0.2* <0.2*  PROT 7.5 6.6  ALBUMIN 3.5 3.0*  CBC:  Recent Labs Lab 12/01/13 1536 12/02/13 0325  WBC 4.8 4.0  NEUTROABS 2.4  --   HGB 11.0* 10.4*  HCT 33.0* 31.4*  MCV 92.2 90.0  PLT 144* 126*   CBG:  Recent Labs Lab 12/01/13 1637 12/01/13 2101 12/02/13 0414 12/02/13 0724 12/02/13 0742 12/02/13 0804  GLUCAP 141* 150* 164* 53* 66* 107*   Drugs of Abuse     Component Value Date/Time   LABOPIA NEG 09/25/2008 2042   COCAINSCRNUR NEG 09/25/2008 2042   LABBENZ NEG  09/25/2008 2042   AMPHETMU NEG 09/25/2008 2042    Medications: I have reviewed the patient's current medications. Scheduled Meds: . calcium-vitamin D  1 tablet Oral BID  . heparin  5,000 Units Subcutaneous 3 times per day  . lipase/protease/amylase  24,000 Units Oral TID WC  . losartan  25 mg Oral Daily  . Travoprost (BAK Free)  1 drop Both Eyes QHS   Continuous Infusions:  PRN Meds:.albuterol, guaiFENesin-dextromethorphan, phenylephrine, polyethylene glycol Assessment/Plan: Active Problems:   Diabetes mellitus type 2 with retinopathy   Essential hypertension   Chronic kidney disease, stage III (moderate)   Hypoglycemia  [name] is a 63 yo woman with DMII who has been struggling with hypoglycemic episodes on her home Lantus.   Recurrent Hypoglycemia: Her hypoglycemic episodes seem to be occurring in the morning on a regular basis. Hypoglycemia is more common in patients who take long-acting insulin such as Lantus. Risk of hypoglycemia does increase with advancing age; interestingly, older adults tend to have more neuroglycopenic manifestations of hypoglycemia (dizziness, weakness, delirium, confusion) compared with adrenergic manifestations (tremors, sweating); she reports the opposite. This could be a nocturnal dip in glucose, as is sometimes noted in diabetics, though with her morning insulin regimen, it would be  more likely that she have dips during the day. It is possible that she needs a Lantus dose adjustment, that her compliance has varied, or that she has something more rare such as an insulinoma or cortisol deficiency. Patient does report consuming several cans of beer, about every other day, but she says that she stopped drinking for about 4 days to see if it made a difference in her hypoglycemic events; these events continued as usual. C-peptide 0.40 (low) and sulfonylurea hypoglycemics panel  - Creon as per home medications  - CBG monitoring; had drop this morning, corrected with  food. Continue to trend - HgbA1c, beta-hydroxybutyric acid and sulfonylurea hypoglycemia panel pending - Likely needs a dose decrease in her Lantus or a conversion to oral hypoglycemics at discharge  Hypertension:  - Continue home Cozaar   DVT Ppx:  - [place] heparin and SCDs   Diet: carb mod  Dispo: Disposition is deferred at this time, awaiting improvement of current medical problems.  Anticipated discharge in approximately 0 day(s).   The patient does have a current PCP Lucious Groves, DO) and does need an [facility] hospital follow-up appointment after discharge.  The patient does not have transportation limitations that hinder transportation to clinic appointments.  .Services Needed at time of discharge: Y = Yes, Blank = No PT:   OT:   RN:   Equipment:   Other:     LOS: 1 day   Drucilla Schmidt, MD 12/02/2013, 11:08 AM

## 2013-12-02 NOTE — Discharge Summary (Signed)
Medicine attending discharge note: I personally interviewed and examined this patient this morning together with resident physician Dr. Drucilla Schmidt and I concur with her evaluation and discharge plans. The patient will followup in our internal medicine clinic. We're going to hold her insulin in view of recent hypoglycemic episodes also documented during his hospitalization. We may be able to control her diabetes with oral agents alone but certainly if she goes back on insulin she'll need a lower dose than her prehospital dose of 34 units of Lantus.  Murriel Hopper, MD, [place]  Hematology-Oncology/Internal Medicine

## 2013-12-02 NOTE — Progress Notes (Signed)
Convinced pt to use SCDs. Explained the importance of using them. Will order new set from [employer]

## 2013-12-02 NOTE — Progress Notes (Signed)
Hypoglycemic Event  CBG: 66  Treatment: 15 GM carbohydrate snack  Symptoms: sweating  Follow-up CBG: Time:0804 CBG Result:107  Possible Reasons for Event: Unknown  Comments/MD notified:Dr. Mallory made aware, no new orders given    [name], [name] C  Remember to initiate Hypoglycemia Order Set & complete

## 2013-12-02 NOTE — Progress Notes (Signed)
Utilization Review completed.  

## 2013-12-02 NOTE — Progress Notes (Signed)
Hypoglycemic Event  CBG: 53  Treatment: 15 GM carbohydrate snack  Symptoms: sweating  Follow-up CBG: Time:0742 CBG Result:66  Possible Reasons for Event: Unknown  Comments/MD notified:Dr. Mallory informed.    [name], [name] C  Remember to initiate Hypoglycemia Order Set & complete

## 2013-12-02 NOTE — Progress Notes (Signed)
[name] discharged Home per MD order.  Discharge instructions reviewed and discussed with the patient, all questions and concerns answered. Copy of instructions, care notes given for new medicines & diagnosis and scripts given to patient.    Medication List    STOP taking these medications       insulin glargine 100 UNIT/ML injection  Commonly known as:  LANTUS      TAKE these medications       albuterol 108 (90 BASE) MCG/ACT inhaler  Commonly known as:  PROVENTIL HFA  Inhale 2 puffs into the lungs every 6 (six) hours as needed for wheezing.     alendronate 70 MG tablet  Commonly known as:  FOSAMAX  Take 1 tablet (70 mg total) by mouth every 7 (seven) days. Take with a full glass of water on an empty stomach. Takes on Tues.     calcium-vitamin D 500-200 MG-UNIT per tablet  Take 1 tablet by mouth 2 (two) times daily.     losartan 25 MG tablet  Commonly known as:  COZAAR  Take 1 tablet (25 mg total) by mouth once.     Pancrelipase (Lip-Prot-Amyl) 24000 UNITS Cpep  Take 2-3 capsules (48,000-72,000 Units total) by mouth 3 (three) times daily. With meals and snacks. No more than 10/24hours     polyethylene glycol packet  Commonly known as:  MIRALAX / GLYCOLAX  Take 17 g by mouth daily as needed. For constipation     travoprost (benzalkonium) 0.004 % ophthalmic solution  Commonly known as:  TRAVATAN  Place 1 drop into both eyes at bedtime.        Patients skin is clean, dry and intact, no evidence of skin break down. IV site discontinued and catheter remains intact. Site without signs and symptoms of complications. Dressing and pressure applied.  Patient escorted to car by RN in a wheelchair,  no distress noted upon discharge.  [name], Chun Sellen C 12/02/2013 5:21 PM

## 2013-12-02 NOTE — Progress Notes (Signed)
Patient ID: [name], female   DOB: Feb 16, 1951, 63 y.o.   MRN: [id number] Medicine attending admission note: I personally interviewed and examined this patient and I attest to the accuracy of the evaluation and management plan as recorded by resident physician Dr. Drucilla Schmidt.  Clinical summary: Pleasant 63 year old woman, with essential hypertension and type 2 diabetes currently on Lantus insulin alone 34 units daily. She has associated diabetic retinopathy and neuropathy. She has a remote history of alcohol related pancreatitis. She lives alone and manages her own sugars. She knows when she gets hypoglycemic because she begins to feel bad and to sweat. Over the last few days her a.m. sugars have been running low in the 50-80 range. She takes her Lantus in the morning. She did try to decrease her dose but was concerned when a.m. sugars remained low and she presented to the emergency department for further evaluation. There has been no change in her diet. She has no signs or symptoms of infection. She has had no nausea and vomiting. On arrival in the emergency department initial blood sugar was 215 but a repeat just 3 hours later was 55.  On initial exam blood pressure 175/70, pulse 58 regular, temperature 98.6, respirations 20, oxygen saturation 98% on room air. A 3/6 systolic murmur at the cardiac apex. Clear lungs. No focal neurologic deficits. Abdomen nontender no mass no organomegaly.  Pertinent lab: Initial glucose 54, BUN 32, creatinine 1.5, bicarbonate 24, anion gap 10, white count 4800, hemoglobin 11, MCV 92, platelets 144,000.  All her insulin was held. Review of her previous record shows that insulin was started a few years ago as an outpatient on or before June of 2011. It is not clear why she was not started on a oral hypoglycemic drug but records don't go back past June 2011 in [place] and she may have been on an oral drug prior to that. There does not seem to be any significant change  in her chronic renal insufficiency. Baseline creatinines have varied  between 1.3 and 1.5 over the last 2 years. Creatinine on admission 12/01/2013  1.49. With gentle hydration repeat value today is 1.1 with BUN 28.   Current exam: Blood pressure 158/67, pulse 62, temperature 98.3 F (36.8 C), temperature source Oral, resp. rate 18, height 5\' 5"  (1.651 m), weight 186 lb 14.4 oz (84.777 kg), SpO2 100.00%. No additional findings other than those reported by Dr. Sherrine Maples  Impression: Decreased insulin need unclear reason Under direct hospital observation, she appears to be very responsible about her diabetes care. She is eating well. Blood sugars have stabilized off insulin.  Plan: We are going to give her a trial off all insulin and then consider starting her on a oral regimen as an outpatient. She will followup in our internal medicine clinic.  Murriel Hopper, MD, [facility]ematology-Oncology/Internal Medicine

## 2013-12-02 NOTE — Discharge Summary (Signed)
Name: [name] MRN: [id number] DOB: [birth year] 63 y.o. PCP: Lucious Groves, DO  Date of Admission: 12/01/2013  3:19 PM Date of Discharge: 12/02/2013 Attending Physician: Annia Belt, MD  Discharge Diagnosis: Active Problems:   Diabetes mellitus type 2 with retinopathy   Essential hypertension   Chronic kidney disease, stage III (moderate)   Hypoglycemia  Discharge Medications:   Medication List    STOP taking these medications       insulin glargine 100 UNIT/ML injection  Commonly known as:  LANTUS      TAKE these medications       albuterol 108 (90 BASE) MCG/ACT inhaler  Commonly known as:  PROVENTIL HFA  Inhale 2 puffs into the lungs every 6 (six) hours as needed for wheezing.     alendronate 70 MG tablet  Commonly known as:  FOSAMAX  Take 1 tablet (70 mg total) by mouth every 7 (seven) days. Take with a full glass of water on an empty stomach. Takes on Tues.     calcium-vitamin D 500-200 MG-UNIT per tablet  Take 1 tablet by mouth 2 (two) times daily.     losartan 25 MG tablet  Commonly known as:  COZAAR  Take 1 tablet (25 mg total) by mouth once.     Pancrelipase (Lip-Prot-Amyl) 24000 UNITS Cpep  Take 2-3 capsules (48,000-72,000 Units total) by mouth 3 (three) times daily. With meals and snacks. No more than 10/24hours     polyethylene glycol packet  Commonly known as:  MIRALAX / GLYCOLAX  Take 17 g by mouth daily as needed. For constipation     travoprost (benzalkonium) 0.004 % ophthalmic solution  Commonly known as:  TRAVATAN  Place 1 drop into both eyes at bedtime.        Disposition and follow-up:   [name] was discharged from [facility] in Stable condition.  At the hospital follow up visit please address:  1.  [name] was here for help with her hypoglycemic episodes, which she has been experiencing at home in the mornings. Interestingly, she typically takes her Lantus (34 U) after these FS each morning.  Because of these episodes (one of which she had while in the hospital), she was advised to stop taking her insulin at home for the time being. She has been told to keep a daily log of her sugars. She has follow-up early next week - please reassess need for diabetes medications based on this log.  She was also hypertensive during hospitalization; because she has always been normotensive in her clinic visits, she was kept on her home dose of cozaar 25 mg daily at discharge; consider increasing to 50 mg daily and follow creatinine.  2.  Labs / imaging needed at time of follow-up: none  3.  Pending labs/ test needing follow-up: sulfonylurea hypoglycemia panel, beta-hydroxybutyric acid and proinsulin/insulin ratio (pending at discharge)  Follow-up Appointments:     Follow-up Information   Follow up with Lucious Groves, DO. (Clinic will be calling you to set up an appointment for [facility])    Specialty:  Internal Medicine   Contact information:   [address] [place] [address] [phone]       Discharge Instructions: Please stop taking your insulin.  Keep close record of your finger sticks over the next few days. The clinic will be calling you to schedule an appointment early next week to evaluate whether you need to be restarted  on any medications for your diabetes.  If you feel dizzy, sweaty or confused, or if your low blood sugar levels are not being corrected with juice and food (as they were in the hospital), please return to the hospital or call the clinic.  It will also help your sugars if you stop drinking alcohol.  Your blood pressures were also running high on this admission; your dose of Cozaar was increased to 50 mg daily.    Low Blood Sugar Low blood sugar (hypoglycemia) means that the level of sugar in your blood is lower than it should be. Signs of low blood sugar include:  Getting sweaty.  Feeling hungry.  Feeling dizzy or weak.  Feeling sleepier  than normal.  Feeling nervous.  Headaches.  Having a fast heartbeat. Low blood sugar can happen fast and can be an emergency. Your doctor can do tests to check your blood sugar level. You can have low blood sugar and not have diabetes. HOME CARE  Check your blood sugar as told by your doctor. If it is less than 70 mg/dl or as told by your doctor, take 1 of the following:  3 to 4 glucose tablets.   cup clear juice.   cup soda pop, not diet.  1 cup milk.  5 to 6 hard candies.  Recheck blood sugar after 15 minutes. Repeat until it is at the right level.  Eat a snack if it is more than 1 hour until the next meal.  Only take medicine as told by your doctor.  Do not skip meals. Eat on time.  Do not drink alcohol except with meals.  Check your blood glucose before driving.  Check your blood glucose before and after exercise.  Always carry treatment with you, such as glucose pills.  Always wear a medical alert bracelet if you have diabetes. GET HELP RIGHT AWAY IF:   Your blood glucose goes below 70 mg/dl or as told by your doctor, and you:  Are confused.  Are not able to swallow.  Pass out (faint).  You cannot treat yourself. You may need someone to help you.  You have low blood sugar problems often.  You have problems from your medicines.  You are not feeling better after 3 to 4 days.  You have vision changes. MAKE SURE YOU:   Understand these instructions.  Will watch this condition.  Will get help right away if you are not doing well or get worse. Document Released: 05/06/2009 Document Revised: 05/04/2011 Document Reviewed: 05/06/2009 [facility] Patient Information 2015 [place], [place]. This information is not intended to replace advice given to you by your health care provider. Make sure you discuss any questions you have with your health care provider.  Consultations:  none  Procedures Performed:  No results found.  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 She reports that she used to take 34 units of Lantus every morning without any issues. However, starting about a week ago, she experienced very low finger stick readings in the morning. She typically gets up very early and takes her blood sugar at 6:00AM. For the past week, her levels were in the 50s-80s. Two days ago, she adjusted her Lantus dose to 15 units. She has continued to manipulate  her dosing accordingly. However, this morning, one of her FS after 25 units of Lantus was 85 and did not respond to juice (in fact, she fell to 73); so, she came into the ED for help. Of note, during her hypoglycemic episodes, she "feels bad" and sweats profusely. She denies anxiety, hunger, shaking, vomiting, fever, changes in vision, confusion, changes in appetite or diet, or initiation/changes in any other medications. She has not been exercising more than at baseline. She is confident that she administers her medications accurately and states that she can see well enough to draw up her Lantus without any problems.   Of note, among many cancellations, [name]'s most recent PCP appointment was in 03/2013. Notes at that time suggest noncompliance with insulin regimen.   Admission Physical Exam: Appearance: in NAD, sitting up in bed watching TV  HEENT: AT/[place], PERRL, EOMi, no lymphadenopathy  Heart: RRR, normal S1S2 with grade 3 holosystolic murmur at apex  Lungs: CTAB, no wheezes  Abdomen: BS+, soft, nontender  Extremities: healed ulcer plantar surface right foot  Neurologic: CN II-XII intact, sensation and strength grossly intact  Skin: no lesions or rashes  Hospital Course by problem list: Active Problems:   Diabetes mellitus type 2 with retinopathy    Essential hypertension   Chronic kidney disease, stage III (moderate)   Hypoglycemia   Recurrent Hypoglycemia in the Context of DMII: Her hypoglycemic episodes seem to be occurring in the morning on a regular basis. Hypoglycemia is more common in patients who take long-acting insulin such as Lantus. Risk of hypoglycemia does increase with advancing age; interestingly, older adults tend to have more neuroglycopenic manifestations of hypoglycemia (dizziness, weakness, delirium, confusion) compared with adrenergic manifestations (tremors, sweating); she reports the opposite. This could be a nocturnal dip in glucose, as is sometimes noted in diabetics, though with her morning insulin regimen, it would be more likely that she have dips during the day. It is possible that she needs a Lantus dose adjustment, that her compliance has varied, or that she has something more rare such as an insulinoma or cortisol deficiency. Patient does report consuming several cans of beer, about every other day, but she says that she stopped drinking for about 4 days to see if it made a difference in her hypoglycemic events; these events continued as usual. C-peptide 0.40 (low) and sulfonylurea hypoglycemics panel   Hypertension: Patient was continued on home Cozaar. She remained slightly hypertensive (in the 150s-160s over 50s to 60s) throughout.   CKD Stage III: Patient's creatinine was actually below baseline on this admission at 1.10.  Discharge Vitals:   BP 158/67  Pulse 62  Temp(Src) 98.3 F (36.8 C) (Oral)  Resp 18  Ht 5\' 5"  (1.651 m)  Wt 186 lb 14.4 oz (84.777 kg)  BMI 31.10 kg/m2  SpO2 100%  Discharge Labs:  Results for orders placed during the hospital encounter of 12/01/13 (from the past 24 hour(s))  CBG MONITORING, ED     Status: Abnormal   Collection Time    12/01/13  3:26 PM      Result Value Ref Range   Glucose-Capillary 55 (*) 70 - 99 mg/dL  COMPREHENSIVE METABOLIC PANEL     Status: Abnormal    Collection Time    12/01/13  3:36 PM      Result Value Ref Range   Sodium 139  137 - 147 mEq/L   Potassium 4.6  3.7 - 5.3 mEq/L   Chloride 105  96 - 112 mEq/L  CO2 24  19 - 32 mEq/L   Glucose, Bld 54 (*) 70 - 99 mg/dL   BUN 32 (*) 6 - 23 mg/dL   Creatinine, Ser 1.49 (*) 0.50 - 1.10 mg/dL   Calcium 8.7  8.4 - 10.5 mg/dL   Total Protein 7.5  6.0 - 8.3 g/dL   Albumin 3.5  3.5 - 5.2 g/dL   AST 39 (*) 0 - 37 U/L   ALT 35  0 - 35 U/L   Alkaline Phosphatase 74  39 - 117 U/L   Total Bilirubin <0.2 (*) 0.3 - 1.2 mg/dL   GFR calc non Af Amer 37 (*) >90 mL/min   GFR calc Af Amer 43 (*) >90 mL/min   Anion gap 10  5 - 15  CBC WITH DIFFERENTIAL     Status: Abnormal   Collection Time    12/01/13  3:36 PM      Result Value Ref Range   WBC 4.8  4.0 - 10.5 K/uL   RBC 3.58 (*) 3.87 - 5.11 MIL/uL   Hemoglobin 11.0 (*) 12.0 - 15.0 g/dL   HCT 33.0 (*) 36.0 - 46.0 %   MCV 92.2  78.0 - 100.0 fL   MCH 30.7  26.0 - 34.0 pg   MCHC 33.3  30.0 - 36.0 g/dL   RDW 14.8  11.5 - 15.5 %   Platelets 144 (*) 150 - 400 K/uL   Neutrophils Relative % 50  43 - 77 %   Neutro Abs 2.4  1.7 - 7.7 K/uL   Lymphocytes Relative 41  12 - 46 %   Lymphs Abs 2.0  0.7 - 4.0 K/uL   Monocytes Relative 6  3 - 12 %   Monocytes Absolute 0.3  0.1 - 1.0 K/uL   Eosinophils Relative 3  0 - 5 %   Eosinophils Absolute 0.2  0.0 - 0.7 K/uL   Basophils Relative 0  0 - 1 %   Basophils Absolute 0.0  0.0 - 0.1 K/uL  CBG MONITORING, ED     Status: Abnormal   Collection Time    12/01/13  4:17 PM      Result Value Ref Range   Glucose-Capillary 125 (*) 70 - 99 mg/dL  CBG MONITORING, ED     Status: Abnormal   Collection Time    12/01/13  4:37 PM      Result Value Ref Range   Glucose-Capillary 141 (*) 70 - 99 mg/dL  C-PEPTIDE     Status: Abnormal   Collection Time    12/01/13  7:09 PM      Result Value Ref Range   C-Peptide 0.40 (*) 0.80 - 3.90 ng/mL  GLUCOSE, CAPILLARY     Status: Abnormal   Collection Time    12/01/13  9:01 PM       Result Value Ref Range   Glucose-Capillary 150 (*) 70 - 99 mg/dL  COMPREHENSIVE METABOLIC PANEL     Status: Abnormal   Collection Time    12/02/13  3:25 AM      Result Value Ref Range   Sodium 140  137 - 147 mEq/L   Potassium 4.1  3.7 - 5.3 mEq/L   Chloride 106  96 - 112 mEq/L   CO2 22  19 - 32 mEq/L   Glucose, Bld 195 (*) 70 - 99 mg/dL   BUN 28 (*) 6 - 23 mg/dL   Creatinine, Ser 1.10  0.50 - 1.10 mg/dL   Calcium 8.8  8.4 -  10.5 mg/dL   Total Protein 6.6  6.0 - 8.3 g/dL   Albumin 3.0 (*) 3.5 - 5.2 g/dL   AST 43 (*) 0 - 37 U/L   ALT 33  0 - 35 U/L   Alkaline Phosphatase 65  39 - 117 U/L   Total Bilirubin <0.2 (*) 0.3 - 1.2 mg/dL   GFR calc non Af Amer 53 (*) >90 mL/min   GFR calc Af Amer 61 (*) >90 mL/min   Anion gap 12  5 - 15  CBC     Status: Abnormal   Collection Time    12/02/13  3:25 AM      Result Value Ref Range   WBC 4.0  4.0 - 10.5 K/uL   RBC 3.49 (*) 3.87 - 5.11 MIL/uL   Hemoglobin 10.4 (*) 12.0 - 15.0 g/dL   HCT 31.4 (*) 36.0 - 46.0 %   MCV 90.0  78.0 - 100.0 fL   MCH 29.8  26.0 - 34.0 pg   MCHC 33.1  30.0 - 36.0 g/dL   RDW 14.8  11.5 - 15.5 %   Platelets 126 (*) 150 - 400 K/uL  HEMOGLOBIN A1C     Status: Abnormal   Collection Time    12/02/13  3:25 AM      Result Value Ref Range   Hemoglobin A1C 7.4 (*) <5.7 %   Mean Plasma Glucose 166 (*) <117 mg/dL  GLUCOSE, CAPILLARY     Status: Abnormal   Collection Time    12/02/13  4:14 AM      Result Value Ref Range   Glucose-Capillary 164 (*) 70 - 99 mg/dL   Comment 1 Notify RN     Comment 2 Documented in Chart    GLUCOSE, CAPILLARY     Status: Abnormal   Collection Time    12/02/13  7:24 AM      Result Value Ref Range   Glucose-Capillary 53 (*) 70 - 99 mg/dL   Comment 1 Notify RN     Comment 2 Documented in Chart    GLUCOSE, CAPILLARY     Status: Abnormal   Collection Time    12/02/13  7:42 AM      Result Value Ref Range   Glucose-Capillary 66 (*) 70 - 99 mg/dL  GLUCOSE, CAPILLARY     Status:  Abnormal   Collection Time    12/02/13  8:04 AM      Result Value Ref Range   Glucose-Capillary 107 (*) 70 - 99 mg/dL  GLUCOSE, CAPILLARY     Status: Abnormal   Collection Time    12/02/13 11:53 AM      Result Value Ref Range   Glucose-Capillary 119 (*) 70 - 99 mg/dL  GLUCOSE, CAPILLARY     Status: Abnormal   Collection Time    12/02/13  2:23 PM      Result Value Ref Range   Glucose-Capillary 152 (*) 70 - 99 mg/dL    Signed: Drucilla Schmidt, MD 12/02/2013, 1:05 PM    Services Ordered on Discharge: none Equipment Ordered on Discharge: none

## 2013-12-04 ENCOUNTER — Telehealth: Payer: Self-pay | Admitting: Internal Medicine

## 2013-12-04 ENCOUNTER — Telehealth: Payer: Self-pay | Admitting: *Deleted

## 2013-12-04 LAB — GLUCOSE, CAPILLARY: Glucose-Capillary: 78 mg/dL (ref 70–99)

## 2013-12-04 MED ORDER — METFORMIN HCL 500 MG PO TABS: 500.0000 mg | ORAL_TABLET | Freq: Every day | ORAL | Status: DC

## 2013-12-04 NOTE — Telephone Encounter (Signed)
Pt d/c on Saturday 10/10 and was told not to take insulin until f/u.  Pt rides transportation and can not come into clinic until Monday.  appointment given for  the 19th at 9:15 Pt # [phone]  Dr Sherrine Maples called and will call pt with directions on insulin dose until appointment.  Pt states CBG had been 200 since not taking any insulin.

## 2013-12-04 NOTE — Telephone Encounter (Signed)
Patient was discharged over the weekend and sent home without insulin, given her recent episodes of hypoglycemia with the plan for early follow-up this week.  However, patient is unable to make her hospital follow-up appointment this week due to transportation; her FS have been in the 200s at home. She has an appointment that she can make on 10/19.  So, she will be started on metformin 500 mg daily; sent to patient's pharmacy today. Patient's A1c was 7.4%. Her creatinine was 1.10 at discharge with GFR 61.  Called patient ; she is aware of the plan and will be taking her metformin with her biggest meal of the day (dinner). She assured me that she will attend her appointment next week.  Venita Lick, PGY1 [phone]

## 2013-12-06 ENCOUNTER — Ambulatory Visit (INDEPENDENT_AMBULATORY_CARE_PROVIDER_SITE_OTHER): Payer: Medicaid Other | Admitting: Podiatry

## 2013-12-06 ENCOUNTER — Encounter: Payer: Self-pay | Admitting: Podiatry

## 2013-12-06 VITALS — BP 161/77 | HR 63 | Temp 97.3°F | Resp 16 | Ht 65.0 in | Wt 190.0 lb

## 2013-12-06 DIAGNOSIS — L97501 Non-pressure chronic ulcer of other part of unspecified foot limited to breakdown of skin: Secondary | ICD-10-CM

## 2013-12-06 NOTE — Patient Instructions (Signed)
Apply Silvadene cream daily to the skin ulcer on the right foot and cover with gauze

## 2013-12-06 NOTE — Progress Notes (Signed)
   Subjective:    Patient ID: [name], female    DOB: [birth year], 63 y.o.   MRN: [id number]  HPI Patient presents for ongoing debridement of ulcerative and pre-ulcerative hyperkeratoses bilaterally. She has a pending appoint with Dr. Gwenlyn Found to evaluate vascular status from abnormal arterial Doppler of 10/03/2013.  Review of Systems  Cardiovascular:       Pt states hospitalized 12/01/2013 for hypoglycemic episode, but B/P was high and remains high.  Endocrine:       Hospitalized 12/01/2013 for hypoglycemic episode.  All other systems reviewed and are negative.      Objective:   Physical Exam  Orientated x3  Right foot 5 mm superficial ulcer with red granular base surrounded by hyperkeratotic tissue Plantar keratoses first MPJ  Left foot: Plantar keratoses sub-first and fifth MPJ left Keratoses left heel     Assessment & Plan:   Assessment: Noninfected superficial ulceration plantar fifth right MPJ  Keratoses x4  Plan: Ulcer plantar right debrided and Silvadene applied and patient will continue to do this on daily basis Keratoses x3 debrided  Reappoint x2 weeks

## 2013-12-07 LAB — BETA-HYDROXYBUTYRIC ACID: BETA-HYDROXYBUTYRIC ACID: 0.05 mmol/L

## 2013-12-08 LAB — SULFONYLUREA HYPOGLYCEMICS PANEL, SERUM
ACETOHEXAMIDE: NOT DETECTED ng/mL
CHLORPROPAMIDE: NOT DETECTED ng/mL
Glimepiride: NOT DETECTED ng/mL
Glipizide: NOT DETECTED ng/mL
Glyburide: NOT DETECTED ng/mL
NATEGLINIDE: NOT DETECTED ng/mL
REPAGLINIDE: NOT DETECTED ng/mL
Tolazamide: NOT DETECTED ng/mL
Tolbutamide: NOT DETECTED ng/mL

## 2013-12-11 ENCOUNTER — Encounter: Payer: Self-pay | Admitting: Internal Medicine

## 2013-12-11 ENCOUNTER — Ambulatory Visit (INDEPENDENT_AMBULATORY_CARE_PROVIDER_SITE_OTHER): Payer: Medicaid Other | Admitting: Internal Medicine

## 2013-12-11 VITALS — BP 126/64 | HR 84 | Temp 97.6°F | Ht 65.0 in | Wt 180.1 lb

## 2013-12-11 DIAGNOSIS — M810 Age-related osteoporosis without current pathological fracture: Secondary | ICD-10-CM

## 2013-12-11 DIAGNOSIS — E785 Hyperlipidemia, unspecified: Secondary | ICD-10-CM

## 2013-12-11 DIAGNOSIS — Z0000 Encounter for general adult medical examination without abnormal findings: Secondary | ICD-10-CM

## 2013-12-11 DIAGNOSIS — F17200 Nicotine dependence, unspecified, uncomplicated: Secondary | ICD-10-CM

## 2013-12-11 DIAGNOSIS — Z720 Tobacco use: Secondary | ICD-10-CM

## 2013-12-11 DIAGNOSIS — I10 Essential (primary) hypertension: Secondary | ICD-10-CM

## 2013-12-11 DIAGNOSIS — E11319 Type 2 diabetes mellitus with unspecified diabetic retinopathy without macular edema: Secondary | ICD-10-CM

## 2013-12-11 LAB — LIPID PANEL
CHOL/HDL RATIO: 2.5 ratio
Cholesterol: 133 mg/dL (ref 0–200)
HDL: 53 mg/dL (ref >39)
LDL Cholesterol: 54 mg/dL (ref 0–99)
TRIGLYCERIDES: 129 mg/dL (ref <150)
VLDL: 26 mg/dL (ref 0–40)

## 2013-12-11 LAB — GLUCOSE, CAPILLARY: Glucose-Capillary: 166 mg/dL — ABNORMAL HIGH (ref 70–99)

## 2013-12-11 MED ORDER — METFORMIN HCL 500 MG PO TABS: 500.0000 mg | ORAL_TABLET | Freq: Two times a day (BID) | ORAL | Status: DC

## 2013-12-11 MED ORDER — ALENDRONATE SODIUM 70 MG PO TABS: 70.0000 mg | ORAL_TABLET | ORAL | Status: DC

## 2013-12-11 MED ORDER — POLYETHYLENE GLYCOL 3350 17 G PO PACK: 17.0000 g | PACK | Freq: Every day | ORAL | Status: DC

## 2013-12-11 NOTE — Patient Instructions (Signed)
Please follow up with Dr. Heber Halfway on 12/28/13 per your request and bring in your blood sugar logs at that visit  Please increase your metformin to 500mg  twice a day for now and this may get increased with your PCP in the future if your blood sugars are still high  Please stop smoking, consider calling [phone] for assistance  General Instructions:   Please bring your medicines with you each time you come to clinic.  Medicines may include prescription medications, over-the-counter medications, herbal remedies, eye drops, vitamins, or other pills.   Progress Toward Treatment Goals:  Treatment Goal 12/11/2013  Hemoglobin A1C improved  Blood pressure at goal  Stop smoking smoking less    Self Care Goals & Plans:  Self Care Goal 12/11/2013  Manage my medications take my medicines as prescribed; bring my medications to every visit; refill my medications on time  Monitor my health keep track of my blood glucose; bring my glucose meter and log to each visit; keep track of my blood pressure  Eat healthy foods eat more vegetables; eat foods that are low in salt; eat baked foods instead of fried foods  Be physically active -  Stop smoking -    Home Blood Glucose Monitoring 12/11/2013  Check my blood sugar 3 times a day  When to check my blood sugar before meals    Care Management & Community Referrals:  Referral 09/28/2012  Referrals made for care management support diabetes educator

## 2013-12-11 NOTE — Assessment & Plan Note (Signed)
Refilled fosamax today

## 2013-12-11 NOTE — Assessment & Plan Note (Addendum)
Lab Results  Component Value Date   HGBA1C 7.4* 12/02/2013   HGBA1C 7.9 04/10/2013   HGBA1C 8.3 09/28/2012    Assessment: Diabetes control: good control (HgbA1C at goal) Progress toward A1C goal:  improved Comments: improving A1C, now OFF all insulin  Plan: Medications:  increased metformin to 500mg  bid for now and can titrate up based on CBGs Home glucose monitoring: Frequency: 3 times a day Timing: before meals Instruction/counseling given: reminded to get eye exam, reminded to bring blood glucose meter & log to each visit, reminded to bring medications to each visit, discussed foot care and discussed diet Educational resources provided: handout Self management tools provided:   Other plans: advised to bring in her paper log of sugars and work no diet.  She seems very serious about keeping her diabetes under control and I believe she may do well without insulin if possible.

## 2013-12-11 NOTE — Assessment & Plan Note (Signed)
Will follow up with [facility] eye January 2016 is her next appointment Lipid panel ordered today Foot exam done today

## 2013-12-11 NOTE — Assessment & Plan Note (Signed)
  Assessment: Progress toward smoking cessation:  smoking less (down to 3 cigarettes a day) Barriers to progress toward smoking cessation:    Comments: cutting back, not ready to quit yet  Plan: Instruction/counseling given:  I counseled patient on the dangers of tobacco use, advised patient to stop smoking, and reviewed strategies to maximize success. Educational resources provided:    Self management tools provided:    Medications to assist with smoking cessation:  None Patient agreed to the following self-care plans for smoking cessation: call [facility] (1-800-QUIT-NOW)  Other plans:

## 2013-12-11 NOTE — Assessment & Plan Note (Signed)
Lipid panel ordered today

## 2013-12-11 NOTE — Progress Notes (Signed)
Case discussed with Dr. Qureshi soon after the resident saw the patient.  We reviewed the resident's history and exam and pertinent patient test results.  I agree with the assessment, diagnosis, and plan of care documented in the resident's note. 

## 2013-12-11 NOTE — Assessment & Plan Note (Signed)
BP Readings from Last 3 Encounters:  12/11/13 126/64  12/06/13 161/77  12/02/13 158/67   Lab Results  Component Value Date   NA 140 12/02/2013   K 4.1 12/02/2013   CREATININE 1.10 12/02/2013    Assessment: Blood pressure control: controlled Progress toward BP goal:  at goal  Plan: Medications:  continue current medications cozaar 25mg  daily

## 2013-12-11 NOTE — Progress Notes (Signed)
Subjective:   Patient ID: [name] female   DOB: 18-Sep-1950 63 y.o.   MRN: [id number]  HPI: [name] is a 63 y.o. female with DM2 and HTN presenting to opc today for diabetes and hospital follow up visit.   DM2--relatively well controlled, HbA1C 7.4 on 10/10, improved from 7.9 in February.  Recently admitted for hypoglycemia and Lantus was stopped.  Since then, no more hypoglycemia, however, now has hyperglycemia with CBGs 130s-280, occasional 300s (brought in a week of blood sugars on paper log).  She denies any polyuria, polydipsia, shaking, nausea, or vomiting.  However, she is concerned about her sugars occasionally going up to 200s after food at times.   Since starting metformin 500mg  qd on 10/12, cbg's <300 and cbg this morning was in 130s she says.  She is also working on her diet.   Smoking--continues to smoke cigarettes but has reduced.  Past Medical History  Diagnosis Date  . Diabetes mellitus   . Hypertension   . ANEMIA, CHRONIC DISEASE NEC 03/15/2006  . DEGENERATIVE JOINT DISEASE 05/06/2007    On going for >5 yrs No imaging to confirm Has tried ultram, mobic, neurontin which did not work Ambulance person tried once worked well    . 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 DIABETIC  RETINOPATHY 03/15/2006  . Hyperlipidemia 04/01/2011  . PANCREATITIS, CHRONIC 03/29/2006  . RENAL INSUFFICIENCY, CHRONIC 03/15/2006   Current Outpatient Prescriptions  Medication Sig Dispense Refill  . albuterol (PROVENTIL HFA) 108 (90 BASE) MCG/ACT inhaler Inhale 2 puffs into the lungs every 6 (six) hours as needed for wheezing.  6.7 g  6  . alendronate (FOSAMAX) 70 MG tablet Take 1 tablet (70 mg total) by mouth every 7 (seven) days. Take with a full glass of water on an empty stomach. Takes on Tues.  4 tablet  1  . Calcium Carbonate-Vitamin D (CALCIUM-VITAMIN D) 500-200 MG-UNIT per tablet Take 1 tablet by mouth 2 (two) times daily.  60 tablet  6  . losartan (COZAAR) 25 MG tablet Take 1 tablet  (25 mg total) by mouth once.  30 tablet  3  . metFORMIN (GLUCOPHAGE) 500 MG tablet Take 1 tablet (500 mg total) by mouth 2 (two) times daily with a meal.  60 tablet  1  . Pancrelipase, Lip-Prot-Amyl, 24000 UNITS CPEP Take 2-3 capsules (48,000-72,000 Units total) by mouth 3 (three) times daily. With meals and snacks. No more than 10/24hours  180 capsule  11  . travoprost, benzalkonium, (TRAVATAN) 0.004 % ophthalmic solution Place 1 drop into both eyes at bedtime.      . polyethylene glycol (MIRALAX / GLYCOLAX) packet Take 17 g by mouth daily.  14 each  2   No current facility-administered medications for this visit.   No family history on file. History   Social History  . Marital Status: Single    Spouse Name: N/A    Number of Children: N/A  . Years of Education: N/A   Social History Main Topics  . Smoking status: Current Some Day Smoker -- 0.50 packs/day    Types: Cigarettes  . Smokeless tobacco: None     Comment: [facility] - wants to know if can get some patches.SMOKES WHENEVER SHE GET THEM  . Alcohol Use: 0.0 oz/week     Comment: daily  . Drug Use: No  . Sexual Activity: None   Other Topics Concern  . None   Social History Narrative  . None   Review of Systems:  Constitutional:  Denies fever, chills  HEENT:  Denies congestion  Respiratory:  Denies SOB. Occasional cough.  Cardiovascular:  Denies chest pain  Gastrointestinal:  Denies nausea, vomiting, abdominal pain  Genitourinary:  Denies dysuria, difficulty urinating.   Musculoskeletal:  Reports having arthritis.   Skin:  Dry  Neurological:  Denies syncope   Objective:  Physical Exam: Filed Vitals:   12/11/13 0921  BP: 126/64  Pulse: 84  Temp: 97.6 F (36.4 C)  TempSrc: Oral  Height: 5\' 5"  (1.651 m)  Weight: 180 lb 1.6 oz (81.693 kg)  SpO2: 100%   Vitals reviewed. General: sitting in chair, NAD HEENT: EOMI Cardiac: +3/2 loud holosystolic murmur, loudest at upper sternal borders Pulm: clear to  auscultation bilaterally Abd: soft, nontender, BS present Ext: warm and well perfused, no pedal edema, +2DP B/L, moving all 4 extremities, healing ulcers on plantar surface of feet, no visible opening or drainage, callus' present Neuro: alert and oriented X3  Assessment & Plan:  Discussed with Dr. Eppie Gibson Increased metformin to 500mg  bid for now

## 2013-12-12 ENCOUNTER — Ambulatory Visit: Payer: Medicaid Other | Admitting: Internal Medicine

## 2013-12-12 ENCOUNTER — Encounter: Payer: Self-pay | Admitting: Cardiovascular Disease

## 2013-12-12 ENCOUNTER — Ambulatory Visit (INDEPENDENT_AMBULATORY_CARE_PROVIDER_SITE_OTHER): Payer: Medicaid Other | Admitting: Cardiovascular Disease

## 2013-12-12 VITALS — BP 136/78 | HR 92 | Ht 65.0 in | Wt 179.3 lb

## 2013-12-12 DIAGNOSIS — I739 Peripheral vascular disease, unspecified: Secondary | ICD-10-CM

## 2013-12-12 LAB — PROINSULIN/INSULIN RATIO
Insulin: 46 u[IU]/mL — ABNORMAL HIGH (ref 3–19)
PROINSULIN/INSULIN RATIO: 0.5 % — AB (ref 0.8–21.7)
Proinsulin: 1.5 pmol/L (ref <=8.0)

## 2013-12-12 NOTE — Assessment & Plan Note (Signed)
Who was referred by Dr. Amalia Hailey, her podiatrist, for evaluation of PAD. She has a small ulcer on the plantar surface of her right foot just below the fifth metatarsal head. She had Dopplers performed in our office that revealed right ABI 1.1 with an occluded posterior tibial and left ABI 0.89 with occluded posterior tibial. She does have diabetic peripheral neuropathy. She denies claudication. She has multiple cardiovascular risk factors including diabetes, hypertension and tobacco abuse. He also is 2-3 mm in diameter. I suspect this is related to pressure on the area palpated by her neuropathy. I suspect that she has adequate circulation to heal if this is offloaded. I will see her back when necessary

## 2013-12-12 NOTE — Progress Notes (Signed)
12/12/2013 [name]   May 12, 1950  [id number]  Primary Physician Lucious Groves, DO Primary Cardiologist: Lorretta Harp MD Renae Gloss   HPI:  [name] is a 63 year old moderately overweight single African American female mother of 3 children, grandmother and 24 grandchildren who was referred by Dr. Kendell Bane, podiatrist, for peripheral vascular evaluation. She sees the outpatient clinic at [facility] for primary care. She has been disabled for the last 12 years because of multiple medical conditions. Her cardiovascular possible for continued tobacco abuse, 2 hypertension, diabetes. She has never had a heart attack or stroke. She denies chest pain or shortness of breath. She specifically denies claudication. She does have symptoms compatible with diabetic peripheral neuropathy. Lower extremity Doppler studies performed in our office 11/03/13 revealed a right ABI of 1.1 with an occluded posterior tibial, left ABI 0.89 with occluded posterior tibial.   Current Outpatient Prescriptions  Medication Sig Dispense Refill  . albuterol (PROVENTIL HFA) 108 (90 BASE) MCG/ACT inhaler Inhale 2 puffs into the lungs every 6 (six) hours as needed for wheezing.  6.7 g  6  . alendronate (FOSAMAX) 70 MG tablet Take 1 tablet (70 mg total) by mouth every 7 (seven) days. Take with a full glass of water on an empty stomach. Takes on Tues.  4 tablet  1  . Calcium Carbonate-Vitamin D (CALCIUM-VITAMIN D) 500-200 MG-UNIT per tablet Take 1 tablet by mouth 2 (two) times daily.  60 tablet  6  . losartan (COZAAR) 25 MG tablet Take 1 tablet (25 mg total) by mouth once.  30 tablet  3  . metFORMIN (GLUCOPHAGE) 500 MG tablet Take 1 tablet (500 mg total) by mouth 2 (two) times daily with a meal.  60 tablet  1  . Pancrelipase, Lip-Prot-Amyl, 24000 UNITS CPEP Take 2-3 capsules (48,000-72,000 Units total) by mouth 3 (three) times daily. With meals and snacks. No more than 10/24hours  180 capsule  11  .  polyethylene glycol (MIRALAX / GLYCOLAX) packet Take 17 g by mouth daily.  14 each  2  . travoprost, benzalkonium, (TRAVATAN) 0.004 % ophthalmic solution Place 1 drop into both eyes at bedtime.       No current facility-administered medications for this visit.    No Known Allergies  History   Social History  . Marital Status: Single    Spouse Name: N/A    Number of Children: N/A  . Years of Education: N/A   Occupational History  . Not on file.   Social History Main Topics  . Smoking status: Current Some Day Smoker -- 0.50 packs/day    Types: Cigarettes  . Smokeless tobacco: Not on file     Comment: [facility] - wants to know if can get some patches.SMOKES WHENEVER SHE GET THEM  . Alcohol Use: 0.0 oz/week     Comment: daily  . Drug Use: No  . Sexual Activity: Not on file   Other Topics Concern  . Not on file   Social History Narrative  . No narrative on file     Review of Systems: General: negative for chills, fever, night sweats or weight changes.  Cardiovascular: negative for chest pain, dyspnea on exertion, edema, orthopnea, palpitations, paroxysmal nocturnal dyspnea or shortness of breath Dermatological: negative for rash Respiratory: negative for cough or wheezing Urologic: negative for hematuria Abdominal: negative for nausea, vomiting, diarrhea, bright red blood per rectum, melena, or hematemesis Neurologic: negative for visual changes, syncope, or dizziness All other systems  reviewed and are otherwise negative except as noted above.    Blood pressure 136/78, pulse 92, height 5\' 5"  (1.651 m), weight 179 lb 4.8 oz (81.33 kg).  General appearance: alert and no distress Neck: no adenopathy, no carotid bruit, no JVD, supple, symmetrical, trachea midline and thyroid not enlarged, symmetric, no tenderness/mass/nodules Lungs: clear to auscultation bilaterally Heart: soft outflow tract murmur Extremities: extremities normal, atraumatic, no cyanosis or edema and 2+  dorsalis pedis pulses. Small ulcer plantar surface lateral aspect right foot  EKG not performed today  ASSESSMENT AND PLAN:   Peripheral arterial disease Who was referred by Dr. Amalia Hailey, her podiatrist, for evaluation of PAD. She has a small ulcer on the plantar surface of her right foot just below the fifth metatarsal head. She had Dopplers performed in our office that revealed right ABI 1.1 with an occluded posterior tibial and left ABI 0.89 with occluded posterior tibial. She does have diabetic peripheral neuropathy. She denies claudication. She has multiple cardiovascular risk factors including diabetes, hypertension and tobacco abuse. He also is 2-3 mm in diameter. I suspect this is related to pressure on the area palpated by her neuropathy. I suspect that she has adequate circulation to heal if this is offloaded. I will see her back when necessary      Lorretta Harp MD [facility], [facility] 12/12/2013 2:31 PM

## 2013-12-12 NOTE — Patient Instructions (Signed)
Your physician recommends that you schedule a follow-up appointment as needed  

## 2013-12-20 ENCOUNTER — Encounter: Payer: Self-pay | Admitting: Podiatry

## 2013-12-20 ENCOUNTER — Ambulatory Visit (INDEPENDENT_AMBULATORY_CARE_PROVIDER_SITE_OTHER): Payer: Medicaid Other | Admitting: Podiatry

## 2013-12-20 VITALS — BP 116/64 | HR 80 | Resp 12

## 2013-12-20 DIAGNOSIS — M79676 Pain in unspecified toe(s): Secondary | ICD-10-CM

## 2013-12-20 DIAGNOSIS — L97501 Non-pressure chronic ulcer of other part of unspecified foot limited to breakdown of skin: Secondary | ICD-10-CM

## 2013-12-20 DIAGNOSIS — B351 Tinea unguium: Secondary | ICD-10-CM

## 2013-12-20 NOTE — Progress Notes (Signed)
Patient ID: [name], female   DOB: 05-Oct-1950, 63 y.o.   MRN: [id number]  Subjective: This patient presents again for follow-up care for diabetic foot ulcers and preulcerative hyperkeratoses. She is also complaining of painful toenails.  Objective: The plantar fifth right MPJ has hemorrhagic callus without any drainage or erythema surrounding it Nonhemorrhagic callus plantar right first MPJ, left first MPJ and fifth left MPJ Nuclear keratoses plantar left heel  The toenails are hypertrophic, elongated, discolored, incurvated 6-10  Assessment: Superficial ulcer fifth right MPJ Plantar keratoses 3 Symptomatic onychomycosis 6-10  Plan: Debrided toenails 10 without any bleeding Debrided plantar ulcer fifth right MPJ Debrided Plantar keratoses right and left  Patient will continue to wear shoes with Plastizote insoles If any drainage noted fifth right MPJ area she will apply Silvadene cream  Reappoint 3 weeks

## 2013-12-21 ENCOUNTER — Ambulatory Visit (INDEPENDENT_AMBULATORY_CARE_PROVIDER_SITE_OTHER): Payer: Medicaid Other | Admitting: Internal Medicine

## 2013-12-21 ENCOUNTER — Encounter: Payer: Self-pay | Admitting: Internal Medicine

## 2013-12-21 VITALS — BP 136/58 | HR 89 | Temp 98.2°F | Wt 175.9 lb

## 2013-12-21 DIAGNOSIS — M545 Low back pain, unspecified: Secondary | ICD-10-CM

## 2013-12-21 DIAGNOSIS — N183 Chronic kidney disease, stage 3 unspecified: Secondary | ICD-10-CM

## 2013-12-21 DIAGNOSIS — I10 Essential (primary) hypertension: Secondary | ICD-10-CM

## 2013-12-21 DIAGNOSIS — Z720 Tobacco use: Secondary | ICD-10-CM

## 2013-12-21 DIAGNOSIS — R059 Cough, unspecified: Secondary | ICD-10-CM

## 2013-12-21 DIAGNOSIS — E785 Hyperlipidemia, unspecified: Secondary | ICD-10-CM

## 2013-12-21 DIAGNOSIS — R05 Cough: Secondary | ICD-10-CM

## 2013-12-21 DIAGNOSIS — E11319 Type 2 diabetes mellitus with unspecified diabetic retinopathy without macular edema: Secondary | ICD-10-CM

## 2013-12-21 DIAGNOSIS — F17200 Nicotine dependence, unspecified, uncomplicated: Secondary | ICD-10-CM

## 2013-12-21 DIAGNOSIS — Z0000 Encounter for general adult medical examination without abnormal findings: Secondary | ICD-10-CM

## 2013-12-21 MED ORDER — GLIPIZIDE 5 MG PO TABS: 2.5000 mg | ORAL_TABLET | Freq: Two times a day (BID) | ORAL | Status: DC

## 2013-12-21 MED ORDER — TRAMADOL HCL 50 MG PO TABS: 50.0000 mg | ORAL_TABLET | Freq: Three times a day (TID) | ORAL | Status: DC | PRN

## 2013-12-21 NOTE — Assessment & Plan Note (Signed)
BP Readings from Last 3 Encounters:  12/21/13 136/58  12/20/13 116/64  12/12/13 136/78    Lab Results  Component Value Date   NA 140 12/02/2013   K 4.1 12/02/2013   CREATININE 1.10 12/02/2013    Assessment: Blood pressure control: controlled Progress toward BP goal:  at goal Comments:   Plan: Medications:  Losartan 25mg  daily Educational resources provided:   Self management tools provided:   Other plans: Continue at current dose.

## 2013-12-21 NOTE — Progress Notes (Signed)
[facility] Subjective:   Patient ID: [name] female   DOB: [birth year] 63 y.o.   MRN: [id number]  HPI: [name] is a 63 y.o. female with a PMH significant for  T2DM: with history of hypoglycemia She was diagnosed in 1996, she was started on oral hypoglycemics and eventually changed to insulin pretty soon after. (70/30). This was eventually changed to lantus. She was hospitalized earlier this month for hypoglycemia, she was on lantus at the time.  The exact nature of her hypoglycemia was not determined.  Her lantus was discontinued and started on Metformin.  She is now up to 1g BID of this.  She report her sugars have been running 240s-350s.  She is also concerned that she has lost some weight recently (weight is down 11 lbs from hospitalization weight but about 5-7 lbs down from her usual weight).  She has recently had eye exam in [place] eye and we are waiting on the report.  Chronic Low back pain Patient has had back pain for >10 years she has had lumbar Xrays in 2013 that showed Mild multilevel degenerative disc disease and lumbar spondylosis, including 3 mm of anterolisthesis of L4 upon L5. No bowel or bladder incontinence.  Reports some occasional numbness that radiates to her feet.  She has been taking tylenol for he pain but has run out lately.  She has taken tramadol which helped some.  HTN: She takes Losartan 25mg  a day.  Tobacco Abuse: Down to 2 cigs a day.  Wants to quit but wants to do it herself without aid.   Wt Readings from Last 5 Encounters:  12/21/13 175 lb 14.4 oz (79.788 kg)  12/12/13 179 lb 4.8 oz (81.33 kg)  12/11/13 180 lb 1.6 oz (81.693 kg)  12/06/13 190 lb (86.183 kg)  12/01/13 186 lb 14.4 oz (84.777 kg)     Past Medical History  Diagnosis Date  . Diabetes mellitus   . Hypertension   . ANEMIA, CHRONIC DISEASE NEC 03/15/2006  . DEGENERATIVE JOINT DISEASE 05/06/2007    On going for >5 yrs No imaging to confirm Has tried  ultram, mobic, neurontin which did not work Ambulance person tried once worked well    . 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 DIABETIC  RETINOPATHY 03/15/2006  . Hyperlipidemia 04/01/2011  . PANCREATITIS, CHRONIC 03/29/2006  . RENAL INSUFFICIENCY, CHRONIC 03/15/2006  . Peripheral arterial disease    Current Outpatient Prescriptions  Medication Sig Dispense Refill  . albuterol (PROVENTIL HFA) 108 (90 BASE) MCG/ACT inhaler Inhale 2 puffs into the lungs every 6 (six) hours as needed for wheezing.  6.7 g  6  . alendronate (FOSAMAX) 70 MG tablet Take 1 tablet (70 mg total) by mouth every 7 (seven) days. Take with a full glass of water on an empty stomach. Takes on Tues.  4 tablet  1  . Calcium Carbonate-Vitamin D (CALCIUM-VITAMIN D) 500-200 MG-UNIT per tablet Take 1 tablet by mouth 2 (two) times daily.  60 tablet  6  . glipiZIDE (GLUCOTROL) 5 MG tablet Take 0.5 tablets (2.5 mg total) by mouth 2 (two) times daily before a meal.  30 tablet  3  . losartan (COZAAR) 25 MG tablet Take 1 tablet (25 mg total) by mouth once.  30 tablet  3  . metFORMIN (GLUCOPHAGE) 500 MG tablet Take 1 tablet (500 mg total) by mouth 2 (two) times daily with a meal.  60 tablet  1  . Pancrelipase, Lip-Prot-Amyl, 24000 UNITS CPEP Take  2-3 capsules (48,000-72,000 Units total) by mouth 3 (three) times daily. With meals and snacks. No more than 10/24hours  180 capsule  11  . polyethylene glycol (MIRALAX / GLYCOLAX) packet Take 17 g by mouth daily.  14 each  2  . traMADol (ULTRAM) 50 MG tablet Take 1 tablet (50 mg total) by mouth every 8 (eight) hours as needed.  60 tablet  2  . travoprost, benzalkonium, (TRAVATAN) 0.004 % ophthalmic solution Place 1 drop into both eyes at bedtime.       No current facility-administered medications for this visit.   No family history on file. History   Social History  . Marital Status: Single    Spouse Name: N/A    Number of Children: N/A  . Years of Education: N/A   Social History Main Topics   . Smoking status: Current Some Day Smoker -- 0.50 packs/day    Types: Cigarettes  . Smokeless tobacco: None     Comment: Quit Line - wants to know if can get some patches.SMOKES WHENEVER SHE GET THEM  . Alcohol Use: 0.0 oz/week     Comment: daily  . Drug Use: No  . Sexual Activity: None   Other Topics Concern  . None   Social History Narrative  . None   Review of Systems: Review of Systems  Constitutional: Negative for fever.  Eyes: Negative for blurred vision.  Respiratory: Positive for cough. Negative for sputum production, shortness of breath and wheezing.   Gastrointestinal: Negative for heartburn and abdominal pain.  Genitourinary: Negative for dysuria and frequency.  Musculoskeletal: Positive for back pain.  Skin: Negative for rash.  Neurological: Negative for headaches.  Endo/Heme/Allergies: Negative for polydipsia.     Objective:  Physical Exam: Filed Vitals:   12/21/13 1452  BP: 136/58  Pulse: 89  Temp: 98.2 F (36.8 C)  TempSrc: Oral  Weight: 175 lb 14.4 oz (79.788 kg)  SpO2: 100%  Physical Exam  Nursing note and vitals reviewed. Constitutional: She is well-developed, well-nourished, and in no distress.  HENT:  Head: Normocephalic and atraumatic.  Neck: Normal range of motion. Neck supple.  Cardiovascular: Normal rate, regular rhythm and normal heart sounds.   Pulmonary/Chest: Effort normal and breath sounds normal. No respiratory distress. She has no wheezes. She has no rales.  Abdominal: Soft. Bowel sounds are normal. There is no tenderness.  Musculoskeletal: She exhibits no edema.  Straight leg raise to 90 degrees without pain or neurolgic symptoms. Mild tenderness to palpation of lumbar paraspinal areas.  Skin: Skin is warm and dry.    Assessment & Plan:  Case discussed with Dr. Eppie Gibson See Problem Based Assessment and Plan Medications Ordered Meds ordered this encounter  Medications  . glipiZIDE (GLUCOTROL) 5 MG tablet    Sig: Take 0.5 tablets  (2.5 mg total) by mouth 2 (two) times daily before a meal.    Dispense:  30 tablet    Refill:  3  . traMADol (ULTRAM) 50 MG tablet    Sig: Take 1 tablet (50 mg total) by mouth every 8 (eight) hours as needed.    Dispense:  60 tablet    Refill:  2   Other Orders Orders Placed This Encounter  Procedures  . DG Chest 2 View    Standing Status: Future     Number of Occurrences:      Standing Expiration Date: 02/21/2015    Order Specific Question:  Reason for Exam (SYMPTOM  OR DIAGNOSIS REQUIRED)    Answer:  chronic cough, atelectisis on previous CXR    Order Specific Question:  Preferred imaging location?    Answer:  [facility]

## 2013-12-21 NOTE — Assessment & Plan Note (Addendum)
Reports cough >8 weeks, no cough noted during ~30 minute encounter.  Lungs CTA bilaterally. She does have a previous CXR with atelectasis, in additon she is a current smoker and is on an ARB (losartan). -Will start workup with a repeat CXR and patient to continue to try to quit smoking.  I would hate to lose an ARB in her treatment given her CKD, HTN, and Diabetes.

## 2013-12-21 NOTE — Assessment & Plan Note (Signed)
-  Continue Losartan. -Discussed nephrotoxins and advised avoidence.

## 2013-12-21 NOTE — Assessment & Plan Note (Addendum)
Last Colonoscopy in 2007 by Dr. Henrene Pastor, normal other than hemorrhoids.  Next Due 2017 Needs Pap smear, try to do at next visit.

## 2013-12-21 NOTE — Patient Instructions (Signed)
General Instructions:  Please start taking Glipizide 2.5mg  twice a day to improve your diabetes.  Please bring your medicines with you each time you come to clinic.  Medicines may include prescription medications, over-the-counter medications, herbal remedies, eye drops, vitamins, or other pills.   Progress Toward Treatment Goals:  Treatment Goal 12/21/2013  Hemoglobin A1C deteriorated  Blood pressure at goal  Stop smoking smoking less    Self Care Goals & Plans:  Self Care Goal 12/21/2013  Manage my medications take my medicines as prescribed; bring my medications to every visit; refill my medications on time  Monitor my health keep track of my blood glucose; bring my glucose meter and log to each visit; keep track of my blood pressure  Eat healthy foods eat more vegetables; eat foods that are low in salt; eat baked foods instead of fried foods  Be physically active find an activity I enjoy  Stop smoking set a quit date and stop smoking    Home Blood Glucose Monitoring 12/21/2013  Check my blood sugar 3 times a day  When to check my blood sugar before meals     Care Management & Community Referrals:  Referral 12/21/2013  Referrals made for care management support none needed

## 2013-12-21 NOTE — Assessment & Plan Note (Signed)
Lab Results  Component Value Date   HGBA1C 7.4* 12/02/2013   HGBA1C 7.9 04/10/2013   HGBA1C 8.3 09/28/2012     Assessment: Diabetes control: fair control Progress toward A1C goal:  deteriorated Comments: Hypoglycemia admission earlier this month, stopped lantus  Plan: Medications: Metformin 1g BID, Start Glipizide 2.5mg  BID Home glucose monitoring: Frequency: 3 times a day Timing: before meals Instruction/counseling given: reminded to bring blood glucose meter & log to each visit, reminded to bring medications to each visit, discussed the need for weight loss and discussed diet Educational resources provided:   Self management tools provided:   Other plans: Patient has had some mild weight loss on metformin and off lantus.  I do think her hypoglycemia was caused by her lantus.  She has self tritrated Metformin up to max 1g BID (will need to continue to monitor her renal function but last GFR 61).  I will add Glipizide 2.5mg  BID and titrate this up.  We may need to consider other options in the future like a DPP-4 or GLP-1 receptor agonist or event SGLT-1 agent.

## 2013-12-21 NOTE — Assessment & Plan Note (Addendum)
-  LDL 54 (checked last week), she would likely benefit from a statin medication given her peripheral vascular disease, HTN, DM, and smoking history.  Will defer this discussion of risk and benefits to next visit when we will have more time to discuss risks and benefits on statin therapy.

## 2013-12-21 NOTE — Assessment & Plan Note (Signed)
-  Patient has chronic low back pain without red flag symptoms (has had some mild weight loss but likely due to d/c of lantus and starting metformin).  She does have previous Xray finding (DDD and lumbar spondylosis) to explain her low back pain.  She is at risk for compression fracture (osteoprosis) but her pain is not acute today and she is on treatment for her osteoporosis. - Continue Tylenol, Max 4g/day - Start Tramadol 50mg  Q8PRN #60, if patient with relief will need to sign a pain contract on next visit with me.

## 2013-12-21 NOTE — Assessment & Plan Note (Signed)
  Assessment: Progress toward smoking cessation:  smoking less Barriers to progress toward smoking cessation:  withdrawal symptoms Comments: wants to quit on her own  Plan: Instruction/counseling given:  I counseled patient on the dangers of tobacco use, advised patient to stop smoking, and reviewed strategies to maximize success. Educational resources provided:    Self management tools provided:    Medications to assist with smoking cessation:  None Patient agreed to the following self-care plans for smoking cessation: set a quit date and stop smoking  Other plans: patient attempting to quit on her own.

## 2013-12-22 ENCOUNTER — Other Ambulatory Visit: Payer: Self-pay | Admitting: *Deleted

## 2013-12-22 DIAGNOSIS — F17200 Nicotine dependence, unspecified, uncomplicated: Secondary | ICD-10-CM

## 2013-12-22 DIAGNOSIS — M810 Age-related osteoporosis without current pathological fracture: Secondary | ICD-10-CM

## 2013-12-22 MED ORDER — ALBUTEROL SULFATE HFA 108 (90 BASE) MCG/ACT IN AERS: 2.0000 | INHALATION_SPRAY | Freq: Four times a day (QID) | RESPIRATORY_TRACT | Status: DC | PRN

## 2013-12-27 NOTE — Progress Notes (Signed)
Case discussed with Dr. Heber [place] at the time of the visit.  We reviewed the resident's history and exam and pertinent patient test results.  I agree with the assessment, diagnosis and plan of care documented in the resident's note.  It appears she did not show for her CXR.

## 2013-12-28 ENCOUNTER — Encounter: Payer: Medicaid Other | Admitting: Internal Medicine

## 2014-01-10 ENCOUNTER — Ambulatory Visit (INDEPENDENT_AMBULATORY_CARE_PROVIDER_SITE_OTHER): Payer: Medicaid Other | Admitting: Podiatry

## 2014-01-10 VITALS — BP 141/70 | HR 89 | Temp 98.4°F | Resp 13

## 2014-01-10 DIAGNOSIS — L97501 Non-pressure chronic ulcer of other part of unspecified foot limited to breakdown of skin: Secondary | ICD-10-CM

## 2014-01-10 NOTE — Patient Instructions (Signed)
Apply Silvadene cream to skin ulcer at the base of fifth right toe daily, cover with gauze until healed Wear diabetic shoes with diabetic insoles

## 2014-01-11 NOTE — Progress Notes (Signed)
Patient ID: [name], female   DOB: 04-28-1950, 63 y.o.   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  Objective: Right foot Superficial ulcer fifth MPJ with granular base with abundant hyperkeratotic tissue Plantar keratoses right first MPJ  Left foot Plantar keratoses first and fifth MPJ  Assessment: Superficial ulceration right fifth MPJ Plantar keratoses 3  Plan: Superficial ulceration and keratoses 3 debrided Apply Silvadene cream to the fifth right MPJ ulcer until drainage subsides Wear diabetic shoes with insoles Reappoint 3 weeks

## 2014-01-12 ENCOUNTER — Encounter: Payer: Self-pay | Admitting: Licensed Clinical Social Worker

## 2014-01-12 NOTE — Progress Notes (Signed)
Patient ID: [name], female   DOB: 1950-04-16, 63 y.o.   MRN: [id number] The patient has appointment on 01-15-14 please share with provider.  Follow up with patient regarding recording of blood sugars and the patient reports that she has been logging readings. I advised to take on office visit 01-15-14. The patient has concerns about weight; weights reviewed via EPIC 12-21-13 175; 12-11-13 180; and 04-10-13 186. The patient had a medication change on 12-01-13, Lantus was discontinued and oral medication for diabetes was added. The patient denied using alcohol. The patient reports that her appetite has changed since insulin was discontinued. She reports not eating breakfast because of elevated blood sugar; I advised to start eating breakfast and take medication regardless of blood sugar reading. The patient does request help with buying groceries on a budget; I will fill out referral form for Nutrition consult. The patient was also taking additional diabetes medication when blood sugars were elevated; I advised to stop and the patient states that she has.   Eunice Blase, LPN, [facility]

## 2014-01-15 ENCOUNTER — Encounter: Payer: Self-pay | Admitting: Pulmonary Disease

## 2014-01-15 ENCOUNTER — Telehealth: Payer: Self-pay | Admitting: Dietician

## 2014-01-15 ENCOUNTER — Ambulatory Visit (INDEPENDENT_AMBULATORY_CARE_PROVIDER_SITE_OTHER): Payer: Medicaid Other | Admitting: Pulmonary Disease

## 2014-01-15 VITALS — BP 152/78 | HR 95 | Temp 98.1°F | Ht 65.0 in | Wt 172.5 lb

## 2014-01-15 DIAGNOSIS — I10 Essential (primary) hypertension: Secondary | ICD-10-CM

## 2014-01-15 DIAGNOSIS — R059 Cough, unspecified: Secondary | ICD-10-CM

## 2014-01-15 DIAGNOSIS — E11319 Type 2 diabetes mellitus with unspecified diabetic retinopathy without macular edema: Secondary | ICD-10-CM

## 2014-01-15 DIAGNOSIS — R05 Cough: Secondary | ICD-10-CM

## 2014-01-15 LAB — GLUCOSE, CAPILLARY: Glucose-Capillary: 156 mg/dL — ABNORMAL HIGH (ref 70–99)

## 2014-01-15 MED ORDER — GUAIFENESIN-DM 100-10 MG/5ML PO SYRP: 5.0000 mL | ORAL_SOLUTION | ORAL | Status: DC | PRN

## 2014-01-15 MED ORDER — GLIPIZIDE 5 MG PO TABS: 5.0000 mg | ORAL_TABLET | Freq: Two times a day (BID) | ORAL | Status: DC

## 2014-01-15 MED ORDER — IPRATROPIUM BROMIDE HFA 17 MCG/ACT IN AERS: 1.0000 | INHALATION_SPRAY | Freq: Four times a day (QID) | RESPIRATORY_TRACT | Status: DC | PRN

## 2014-01-15 MED ORDER — AZITHROMYCIN 250 MG PO TABS: ORAL_TABLET | ORAL | Status: AC

## 2014-01-15 MED ORDER — METFORMIN HCL 500 MG PO TABS: 1000.0000 mg | ORAL_TABLET | Freq: Two times a day (BID) | ORAL | Status: DC

## 2014-01-15 MED ORDER — GLUCOSE BLOOD VI STRP: ORAL_STRIP | Status: DC

## 2014-01-15 NOTE — Assessment & Plan Note (Signed)
Assessment: Acute vs chronic bronchitis. Less likely related to ARB. Low suspicion for GERD.  Plan: -CXR as previously ordered. Informed patient that she may go to radiology when she can to get that done. -Ipratropium inhaler 1-2 puffs Q6hr prn and minimize albuterol inhaler usage -Z pak: 500 mg on day 1 followed by 250 mg once daily on days 2-5 -Guaifenesin-dextromethorphan 100/10mg  syrup 40mL Q4hr prn cough

## 2014-01-15 NOTE — Telephone Encounter (Signed)
Called patient to follow up on nutrition referral from [facility] LPN, Eunice Blase. Patient agrees to appointment on 02/08/14. She is concerned because she is decreasing her weight and has a decreased appetite  (decreased 7# in past month)  since stopping the lantus insulin. Her weight used to be 140# prior to insulin initiation. CDE assured patient that  her weight loss may be from eating less and probably not her blood sugars as her blood sugars are under reasonable control per the doctor's note today. Encouraged her to eat at least  3 times a day. She verbalized understanding.

## 2014-01-15 NOTE — Assessment & Plan Note (Signed)
BP Readings from Last 3 Encounters:  01/15/14 152/78  01/10/14 141/70  12/21/13 136/58    Lab Results  Component Value Date   NA 140 12/02/2013   K 4.1 12/02/2013   CREATININE 1.10 12/02/2013    Assessment: Blood pressure control: mildly elevated Comments: On recheck with orthostatics 141/65 HR 72 supine, 137/65 HR 73 sitting, 124/61 HR 84 standing.  Plan: Medications:  continue current medications including losartan 25mg  daily

## 2014-01-15 NOTE — Progress Notes (Signed)
Internal Medicine Clinic Attending  URI - subacute-chronic cough? Bronchitis? Zpack, mucinex, ipratropium. Counseled on smoking cessation.   I saw and evaluated the patient.  I personally confirmed the key portions of the history and exam documented by Dr. Randell Patient and I reviewed pertinent patient test results.  The assessment, diagnosis, and plan were formulated together and I agree with the documentation in the resident's note.

## 2014-01-15 NOTE — Progress Notes (Signed)
Subjective:    Patient ID: [name], female    DOB: Jun 26, 1950, 63 y.o.   MRN: [id number]  HPI Ms. [name] is a 63 year old woman with history of DM2, HTN, CKD presenting for follow up.  DM2: Last seen in clinic 12/21/2013. She was hospitalized 12/01/2013 to 12/02/2013 for hypoglycemia, she was on lantus at the time. Her lantus was discontinued, and she was started on Metformin.At last visit, she was taking 1g BID of Metformin and started on glipizide 2.5mg  BID. Blood sugar ranges from 147 to 205 in AM, 140 to 314 at lunch, 133-186 in evening -- average 170.  [name] once in a while LH, dizziness with standing quickly - doesn't measure blood sugar but feels its related to bp medication.   Cough: Ongoing for over 2 months. Feels chest congestion. Cannot produce mucous though. Cough occurs throughout day. Took albuterol the other day but used 5-6 puffs at one time. Felt "drunk" afterwards.  Review of Systems  Constitutional: Negative for fever and chills.  HENT: Negative for sore throat.   Eyes: Negative for visual disturbance.  Respiratory: Positive for cough (nonproductive). Negative for shortness of breath.   Cardiovascular: Negative for chest pain and leg swelling.  Gastrointestinal: Negative for nausea, vomiting, abdominal pain and diarrhea.  Endocrine: Positive for polydipsia. Negative for polyuria.  Genitourinary: Negative for dysuria and hematuria.  Musculoskeletal: Negative for myalgias.  Skin: Negative for rash.  Neurological: Positive for light-headedness (with standing). Negative for weakness, numbness and headaches.  Hematological: Does not bruise/bleed easily.   Past Medical History  Diagnosis Date  . Diabetes mellitus   . Hypertension   . ANEMIA, CHRONIC DISEASE NEC 03/15/2006  . DEGENERATIVE JOINT DISEASE 05/06/2007    On going for >5 yrs No imaging to confirm Has tried ultram, mobic, neurontin which did not work Ambulance person tried once worked well    . DIABETIC  PERIPHERAL NEUROPATHY 03/15/2006  . DIABETIC  RETINOPATHY 03/15/2006  . Hyperlipidemia 04/01/2011  . PANCREATITIS, CHRONIC 03/29/2006  . RENAL INSUFFICIENCY, CHRONIC 03/15/2006  . Peripheral arterial disease    Current Outpatient Prescriptions on File Prior to Visit  Medication Sig Dispense Refill  . albuterol (PROVENTIL HFA) 108 (90 BASE) MCG/ACT inhaler Inhale 2 puffs into the lungs [name] 6 (six) hours as needed for wheezing. 6.7 g 6  . alendronate (FOSAMAX) 70 MG tablet Take 1 tablet (70 mg total) by mouth [name] 7 (seven) days. Take with a full glass of water on an empty stomach. Takes on Tues. 4 tablet 1  . Calcium Carbonate-Vitamin D (CALCIUM-VITAMIN D) 500-200 MG-UNIT per tablet Take 1 tablet by mouth 2 (two) times daily. 60 tablet 6  . glipiZIDE (GLUCOTROL) 5 MG tablet Take 0.5 tablets (2.5 mg total) by mouth 2 (two) times daily before a meal. 30 tablet 3  . losartan (COZAAR) 25 MG tablet Take 1 tablet (25 mg total) by mouth once. 30 tablet 3  . metFORMIN (GLUCOPHAGE) 500 MG tablet Take 1 tablet (500 mg total) by mouth 2 (two) times daily with a meal. 60 tablet 1  . Pancrelipase, Lip-Prot-Amyl, 24000 UNITS CPEP Take 2-3 capsules (48,000-72,000 Units total) by mouth 3 (three) times daily. With meals and snacks. No more than 10/24hours 180 capsule 11  . polyethylene glycol (MIRALAX / GLYCOLAX) packet Take 17 g by mouth daily. 14 each 2  . traMADol (ULTRAM) 50 MG tablet Take 1 tablet (50 mg total) by mouth [name] 8 (eight) hours as needed. 60 tablet 2  .  travoprost, benzalkonium, (TRAVATAN) 0.004 % ophthalmic solution Place 1 drop into both eyes at bedtime.     No current facility-administered medications on file prior to visit.   Today's Vitals   01/15/14 0958  BP: 152/78  Pulse: 95  Temp: 98.1 F (36.7 C)  TempSrc: Oral  Height: 5\' 5"  (1.651 m)  Weight: 172 lb 8 oz (78.245 kg)  SpO2: 100%   Objective:  Physical Exam  Constitutional: She is oriented to person, place, and time. She  appears well-developed and well-nourished. No distress.  HENT:  Head: Normocephalic and atraumatic.  Mouth/Throat: Oropharynx is clear and moist.  Eyes: EOM are normal. No scleral icterus.  Neck: Neck supple.  Cardiovascular: Normal rate and regular rhythm.   Murmur (mild systolic murmur) heard. Pulmonary/Chest: Effort normal. No respiratory distress. She has no wheezes. She has no rales.  Abdominal: Soft. She exhibits no distension. There is no tenderness.  Musculoskeletal: Normal range of motion. She exhibits no edema.  Neurological: She is alert and oriented to person, place, and time.  Skin: Skin is warm and dry.  Psychiatric: She has a normal mood and affect.   Assessment & Plan:  Please refer to problem based charting.

## 2014-01-15 NOTE — Patient Instructions (Addendum)
Diabetes:  1. Glipizide: take 5 mg twice a day (1 tablet twice a day) 2. Metformin: take 1000mg  twice a day (2 tablets twice a day)  Cough: 1. Ipratropium (Atrovent) inhaler: 1-2 puffs every 6 hours as needed 2. Zithromycin: take as instructed on package 3. Cough syrup: take as instructed on bottle  General Instructions:   Please bring your medicines with you each time you come to clinic.  Medicines may include prescription medications, over-the-counter medications, herbal remedies, eye drops, vitamins, or other pills.   Progress Toward Treatment Goals:  Treatment Goal 01/15/2014  Hemoglobin A1C -  Blood pressure -  Stop smoking smoking the same amount    Self Care Goals & Plans:  Self Care Goal 01/15/2014  Manage my medications take my medicines as prescribed; bring my medications to every visit; refill my medications on time  Monitor my health keep track of my blood glucose; bring my glucose meter and log to each visit; keep track of my weight  Eat healthy foods drink diet soda or water instead of juice or soda; eat more vegetables; eat foods that are low in salt; eat baked foods instead of fried foods; eat fruit for snacks and desserts  Be physically active find an activity I enjoy  Stop smoking -    Home Blood Glucose Monitoring 01/15/2014  Check my blood sugar 3 times a day  When to check my blood sugar before meals    Bronchitis Bronchitis is when the airways that extend from the windpipe into the lungs get red, puffy, and painful (inflamed). Bronchitis often causes thick spit (mucus) to develop. This leads to a cough. A cough is the most common symptom of bronchitis. In acute bronchitis, the condition usually begins suddenly and goes away over time (usually in 2 weeks). Smoking, allergies, and asthma can make bronchitis worse. Repeated episodes of bronchitis may cause more lung problems. HOME CARE  Rest.  Drink enough fluids to keep your pee (urine) clear or pale  yellow (unless you need to limit fluids as told by your doctor).  Only take over-the-counter or prescription medicines as told by your doctor.  Avoid smoking and secondhand smoke. These can make bronchitis worse. If you are a smoker, think about using nicotine gum or skin patches. Quitting smoking will help your lungs heal faster.  Reduce the chance of getting bronchitis again by:  Washing your hands often.  Avoiding people with cold symptoms.  Trying not to touch your hands to your mouth, nose, or eyes.  Follow up with your doctor as told. GET HELP IF: Your symptoms do not improve after 1 week of treatment. Symptoms include:  Cough.  Fever.  Coughing up thick spit.  Body aches.  Chest congestion.  Chills.  Shortness of breath.  Sore throat. GET HELP RIGHT AWAY IF:   You have an increased fever.  You have chills.  You have severe shortness of breath.  You have bloody thick spit (sputum).  You throw up (vomit) often.  You lose too much body fluid (dehydration).  You have a severe headache.  You faint. MAKE SURE YOU:   Understand these instructions.  Will watch your condition.  Will get help right away if you are not doing well or get worse. Document Released: 07/29/2007 Document Revised: 10/12/2012 Document Reviewed: 08/02/2012 [facility] Patient Information 2015 [place], [place]. This information is not intended to replace advice given to you by your health care provider. Make sure you discuss any questions you have with your  health care provider.  

## 2014-01-15 NOTE — Assessment & Plan Note (Signed)
Lab Results  Component Value Date   HGBA1C 7.4* 12/02/2013   HGBA1C 7.9 04/10/2013   HGBA1C 8.3 09/28/2012     Assessment: Diabetes control: fair control  Plan: Medications:  Continue metformin 1000mg  BID. Increase glipizide to 5mg  BID Home glucose monitoring: Frequency: 3 times a day Timing: before meals Instruction/counseling given: reminded to bring blood glucose meter & log to each visit, reminded to bring medications to each visit and discussed the need for weight loss Other plans:  -Follow up with PCP as previously scheduled. Will defer rechecking Hgb A1c and BMP to PCP as it was recently checked 12/02/2013.

## 2014-01-31 ENCOUNTER — Ambulatory Visit (INDEPENDENT_AMBULATORY_CARE_PROVIDER_SITE_OTHER): Payer: Medicaid Other | Admitting: Podiatry

## 2014-01-31 ENCOUNTER — Encounter: Payer: Self-pay | Admitting: Podiatry

## 2014-01-31 VITALS — BP 119/64 | HR 79 | Resp 12

## 2014-01-31 DIAGNOSIS — L84 Corns and callosities: Secondary | ICD-10-CM

## 2014-01-31 DIAGNOSIS — Q828 Other specified congenital malformations of skin: Secondary | ICD-10-CM

## 2014-01-31 DIAGNOSIS — E1142 Type 2 diabetes mellitus with diabetic polyneuropathy: Secondary | ICD-10-CM | POA: Diagnosis not present

## 2014-01-31 NOTE — Patient Instructions (Signed)
Consult with primary care physician for possible pain management to treat painful diabetic peripheral neuropathy  Diabetic Neuropathy Diabetic neuropathy is a nerve disease or nerve damage that is caused by diabetes mellitus. About half of all people with diabetes mellitus have some form of nerve damage. Nerve damage is more common in those who have had diabetes mellitus for many years and who generally have not had good control of their blood sugar (glucose) level. Diabetic neuropathy is a common complication of diabetes mellitus. There are three more common types of diabetic neuropathy and a fourth type that is less common and less understood:   Peripheral neuropathy--This is the most common type of diabetic neuropathy. It causes damage to the nerves of the feet and legs first and then eventually the hands and arms.The damage affects the ability to sense touch.  Autonomic neuropathy--This type causes damage to the autonomic nervous system, which controls the following functions:  Heartbeat.  Body temperature.  Blood pressure.  Urination.  Digestion.  Sweating.  Sexual function.  Focal neuropathy--Focal neuropathy can be painful and unpredictable and occurs most often in older adults with diabetes mellitus. It involves a specific nerve or one area and often comes on suddenly. It usually does not cause long-term problems.  Radiculoplexus neuropathy-- Sometimes called lumbosacral radiculoplexus neuropathy, radiculoplexus neuropathy affects the nerves of the thighs, hips, buttocks, or legs. It is more common in people with type 2 diabetes mellitus and in older men. It is characterized by debilitating pain, weakness, and atrophy, usually in the thigh muscles. CAUSES  The cause of peripheral, autonomic, and focal neuropathies is diabetes mellitus that is uncontrolled and high glucose levels. The cause of radiculoplexus neuropathy is unknown. However, it is thought to be caused by inflammation  related to uncontrolled glucose levels. SIGNS AND SYMPTOMS  Peripheral Neuropathy Peripheral neuropathy develops slowly over time. When the nerves of the feet and legs no longer work there may be:   Burning, stabbing, or aching pain in the legs or feet.  Inability to feel pressure or pain in your feet. This can lead to:  Thick calluses over pressure areas.  Pressure sores.  Ulcers.  Foot deformities.  Reduced ability to feel temperature changes.  Muscle weakness. Autonomic Neuropathy The symptoms of autonomic neuropathy vary depending on which nerves are affected. Symptoms may include:  Problems with digestion, such as:  Feeling sick to your stomach (nausea).  Vomiting.  Bloating.  Constipation.  Diarrhea.  Abdominal pain.  Difficulty with urination. This occurs if you lose your ability to sense when your bladder is full. Problems include:  Urine leakage (incontinence).  Inability to empty your bladder completely (retention).  Rapid or irregular heartbeat (palpitations).  Blood pressure drops when you stand up (orthostatic hypotension). When you stand up you may feel:  Dizzy.  Weak.  Faint.  In men, inability to attain and maintain an erection.  In women, vaginal dryness and problems with decreased sexual desire and arousal.  Problems with body temperature regulation.  Increased or decreased sweating. Focal Neuropathy  Abnormal eye movements or abnormal alignment of both eyes.  Weakness in the wrist.  Foot drop. This results in an inability to lift the foot properly and abnormal walking or foot movement.  Paralysis on one side of your face (Bell palsy).  Chest or abdominal pain. Radiculoplexus Neuropathy  Sudden, severe pain in your hip, thigh, or buttocks.  Weakness and wasting of thigh muscles.  Difficulty rising from a seated position.  Abdominal swelling.  Unexplained  weight loss (usually more than 10 lb [4.5 kg]). DIAGNOSIS   Peripheral Neuropathy Your senses may be tested. Sensory function testing can be done with:  A light touch using a monofilament.  A vibration with tuning fork.  A sharp sensation with a pin prick. Other tests that can help diagnose neuropathy are:  Nerve conduction velocity. This test checks the transmission of an electrical current through a nerve.  Electromyography. This shows how muscles respond to electrical signals transmitted by nearby nerves.  Quantitative sensory testing. This is used to assess how your nerves respond to vibrations and changes in temperature. Autonomic Neuropathy Diagnosis is often based on reported symptoms. Tell your health care provider if you experience:   Dizziness.   Constipation.   Diarrhea.   Inappropriate urination or inability to urinate.   Inability to get or maintain an erection.  Tests that may be done include:   Electrocardiography or Holter monitor. These are tests that can help show problems with the heart rate or heart rhythm.   An X-ray exam may be done. Focal Neuropathy Diagnosis is made based on your symptoms and what your health care provider finds during your exam. Other tests may be done. They may include:  Nerve conduction velocities. This checks the transmission of electrical current through a nerve.  Electromyography. This shows how muscles respond to electrical signals transmitted by nearby nerves.  Quantitative sensory testing. This test is used to assess how your nerves respond to vibration and changes in temperature. Radiculoplexus Neuropathy  Often the first thing is to eliminate any other issue or problems that might be the cause, as there is no stick test for diagnosis.  X-ray exam of your spine and lumbar region.  Spinal tap to rule out cancer.  MRI to rule out other lesions. TREATMENT  Once nerve damage occurs, it cannot be reversed. The goal of treatment is to keep the disease or nerve damage from  getting worse and affecting more nerve fibers. Controlling your blood glucose level is the key. Most people with radiculoplexus neuropathy see at least a partial improvement over time. You will need to keep your blood glucose and HbA1c levels in the target range determined by your health care provider. Things that help control blood glucose levels include:   Blood glucose monitoring.   Meal planning.   Physical activity.   Diabetes medicine.  Over time, maintaining lower blood glucose levels helps lessen symptoms. Sometimes, prescription pain medicine is needed. HOME CARE INSTRUCTIONS:  Do not smoke.  Keep your blood glucose level in the range that you and your health care provider have determined acceptable for you.  Keep your blood pressure level in the range that you and your health care provider have determined acceptable for you.  Eat a well-balanced diet.  Be active every day.  Check your feet every day. SEEK MEDICAL CARE IF:   You have burning, stabbing, or aching pain in the legs or feet.  You are unable to feel pressure or pain in your feet.  You develop problems with digestion such as:  Nausea.  Vomiting.  Bloating.  Constipation.  Diarrhea.  Abdominal pain.  You have difficulty with urination, such as:  Incontinence.  Retention.  You have palpitations.  You develop orthostatic hypotension. When you stand up you may feel:  Dizzy.  Weak.  Faint.  You cannot attain and maintain an erection (in men).  You have vaginal dryness and problems with decreased sexual desire and arousal (in women).  You have severe pain in your thighs, legs, or buttocks.  You have unexplained weight loss. Document Released: 04/20/2001 Document Revised: 11/30/2012 Document Reviewed: 07/21/2012 [facility] Patient Information 2015 [place], [place]. This information is not intended to replace advice given to you by your health care provider. Make sure you discuss any  questions you have with your health care provider.

## 2014-02-01 NOTE — Progress Notes (Signed)
Patient ID: [name], female   DOB: [birth year], 63 y.o.   MRN: [id number]  Subjective: This patient presents for ongoing management of pre-ulcerative in ulcerative diabetic foot ulcers bilaterally at approximately 3 week intervals. She 7 complaining of extreme burning painful feet on and off weightbearing making sleeping, walking difficult. She describes intolerance to gabapentin.  Objective: Orientated 3 Hemorrhagic callus plantar first and fifth MPJ right Nonhemorrhagic callus plantar left first MPJ  Assessment: Pre-ulcerative plantar keratoses 2 right Keratoses 1 right Painful diabetic peripheral neuropathy  Plan: Pre-ulcerative callus and keratoses debrided without a bleeding Maintain diabetic shoes with custom insoles  Patient may need additional pain management for her painful diabetic peripheral neuropathy. Advised her discussed treatment options for painful diabetic peripheral neuropathy with her medical doctor and consider possible referral for pain management.  Reappoint 3 weeks

## 2014-02-08 ENCOUNTER — Ambulatory Visit: Payer: Medicaid Other | Admitting: Dietician

## 2014-02-08 ENCOUNTER — Encounter: Payer: Medicaid Other | Admitting: Internal Medicine

## 2014-02-12 ENCOUNTER — Other Ambulatory Visit: Payer: Self-pay | Admitting: *Deleted

## 2014-02-12 DIAGNOSIS — E11319 Type 2 diabetes mellitus with unspecified diabetic retinopathy without macular edema: Secondary | ICD-10-CM

## 2014-02-12 NOTE — Telephone Encounter (Signed)
Pt is out of strips.  Last Rx did not have directions.  Also need Dx code.

## 2014-02-13 MED ORDER — GLUCOSE BLOOD VI STRP: ORAL_STRIP | Status: DC

## 2014-02-21 ENCOUNTER — Ambulatory Visit: Payer: Medicaid Other | Admitting: Podiatry

## 2014-02-26 ENCOUNTER — Other Ambulatory Visit: Payer: Self-pay | Admitting: *Deleted

## 2014-02-26 DIAGNOSIS — E11319 Type 2 diabetes mellitus with unspecified diabetic retinopathy without macular edema: Secondary | ICD-10-CM

## 2014-02-26 DIAGNOSIS — M810 Age-related osteoporosis without current pathological fracture: Secondary | ICD-10-CM

## 2014-02-26 MED ORDER — GLIPIZIDE 5 MG PO TABS: 5.0000 mg | ORAL_TABLET | Freq: Two times a day (BID) | ORAL | Status: DC

## 2014-02-26 MED ORDER — ALENDRONATE SODIUM 70 MG PO TABS: 70.0000 mg | ORAL_TABLET | ORAL | Status: DC

## 2014-03-01 ENCOUNTER — Ambulatory Visit: Payer: Medicaid Other | Admitting: Dietician

## 2014-03-01 ENCOUNTER — Encounter: Payer: Medicaid Other | Admitting: Internal Medicine

## 2014-03-14 ENCOUNTER — Encounter: Payer: Self-pay | Admitting: Podiatry

## 2014-03-14 ENCOUNTER — Ambulatory Visit (INDEPENDENT_AMBULATORY_CARE_PROVIDER_SITE_OTHER): Payer: Medicaid Other | Admitting: Podiatry

## 2014-03-14 VITALS — BP 130/70 | HR 82 | Resp 12

## 2014-03-14 DIAGNOSIS — L97509 Non-pressure chronic ulcer of other part of unspecified foot with unspecified severity: Secondary | ICD-10-CM

## 2014-03-14 DIAGNOSIS — L97501 Non-pressure chronic ulcer of other part of unspecified foot limited to breakdown of skin: Secondary | ICD-10-CM

## 2014-03-14 DIAGNOSIS — I739 Peripheral vascular disease, unspecified: Secondary | ICD-10-CM

## 2014-03-14 DIAGNOSIS — L84 Corns and callosities: Secondary | ICD-10-CM

## 2014-03-14 NOTE — Progress Notes (Signed)
Patient ID: [name], female   DOB: 1950-11-23, 64 y.o.   MRN: [id number]  Subjective: This patient presents again for management of pre-ulcerative in ulcerative diabetic foot lesions bilaterally at approximately three-month intervals. He's also complaining extremely painful burning feet on and off weightbearing with an intolerance to gabapentin  Objective: Orientated 3 Superficial ulceration with granular bases sub-first and fifth MPJ right surrounded with hyperkeratotic tissue. There is no erythema, edema or active drainage from the sites  Keratoses sub-first and fifth MPJ left and nucleated keratoses left heel  The toenails are elongated, hypertrophic, incurvated, discolored 6-10  Assessment: Superficial ulcerations sub-first and fifth MPJ right Plantar callus first and fifth MPJ pre-ulcerative, left Porokeratosis left Diabetic peripheral neuropathy  Plan: Debridement of ulcers and keratoses without a bleeding  Apply Silvadene dressings to first and fifth MPJ daily until any drainage subsides Continue to wear diabetic shoes with custom insoles Advised patient to discuss treatment options for painful diabetic peripheral neuropathy and/or pain management  Reappoint 3 weeks for debridement of skin ulcers and plantar keratoses and mycotic toenails

## 2014-03-14 NOTE — Patient Instructions (Signed)
Apply Silvadene cream to skin ulcers daily on the bottom of the right foot and cover with gauze until all drainage stops

## 2014-03-15 ENCOUNTER — Encounter: Payer: Medicaid Other | Admitting: Dietician

## 2014-03-15 ENCOUNTER — Encounter: Payer: Self-pay | Admitting: Internal Medicine

## 2014-03-15 ENCOUNTER — Encounter: Payer: Medicaid Other | Admitting: Internal Medicine

## 2014-03-22 ENCOUNTER — Other Ambulatory Visit: Payer: Self-pay | Admitting: *Deleted

## 2014-03-22 DIAGNOSIS — E11319 Type 2 diabetes mellitus with unspecified diabetic retinopathy without macular edema: Secondary | ICD-10-CM

## 2014-03-22 MED ORDER — GLIPIZIDE 5 MG PO TABS: 5.0000 mg | ORAL_TABLET | Freq: Two times a day (BID) | ORAL | Status: DC

## 2014-03-22 MED ORDER — METFORMIN HCL 500 MG PO TABS: 1000.0000 mg | ORAL_TABLET | Freq: Two times a day (BID) | ORAL | Status: DC

## 2014-03-23 ENCOUNTER — Other Ambulatory Visit: Payer: Self-pay | Admitting: *Deleted

## 2014-03-23 DIAGNOSIS — E11319 Type 2 diabetes mellitus with unspecified diabetic retinopathy without macular edema: Secondary | ICD-10-CM

## 2014-03-23 MED ORDER — GLIPIZIDE 5 MG PO TABS: 5.0000 mg | ORAL_TABLET | Freq: Two times a day (BID) | ORAL | Status: DC

## 2014-03-23 MED ORDER — GLUCOSE BLOOD VI STRP: ORAL_STRIP | Status: DC

## 2014-03-28 ENCOUNTER — Telehealth: Payer: Self-pay | Admitting: Internal Medicine

## 2014-03-28 NOTE — Telephone Encounter (Signed)
Call to patient to confirm appointment for 03/29/14 1:15. Unable to leave message

## 2014-03-29 ENCOUNTER — Encounter: Payer: Medicaid Other | Admitting: Internal Medicine

## 2014-03-29 ENCOUNTER — Ambulatory Visit (INDEPENDENT_AMBULATORY_CARE_PROVIDER_SITE_OTHER): Payer: Medicaid Other | Admitting: Internal Medicine

## 2014-03-29 ENCOUNTER — Encounter: Payer: Self-pay | Admitting: Internal Medicine

## 2014-03-29 VITALS — BP 136/55 | HR 80 | Temp 98.2°F | Ht 65.0 in | Wt 159.4 lb

## 2014-03-29 DIAGNOSIS — M810 Age-related osteoporosis without current pathological fracture: Secondary | ICD-10-CM

## 2014-03-29 DIAGNOSIS — I10 Essential (primary) hypertension: Secondary | ICD-10-CM

## 2014-03-29 DIAGNOSIS — N182 Chronic kidney disease, stage 2 (mild): Secondary | ICD-10-CM

## 2014-03-29 DIAGNOSIS — R634 Abnormal weight loss: Secondary | ICD-10-CM | POA: Insufficient documentation

## 2014-03-29 DIAGNOSIS — E11319 Type 2 diabetes mellitus with unspecified diabetic retinopathy without macular edema: Secondary | ICD-10-CM

## 2014-03-29 LAB — BASIC METABOLIC PANEL WITH GFR
BUN: 43 mg/dL — AB (ref 6–23)
CALCIUM: 9.4 mg/dL (ref 8.4–10.5)
CO2: 21 meq/L (ref 19–32)
Chloride: 109 mEq/L (ref 96–112)
Creat: 1.67 mg/dL — ABNORMAL HIGH (ref 0.50–1.10)
GFR, Est African American: 38 mL/min — ABNORMAL LOW
GFR, Est Non African American: 33 mL/min — ABNORMAL LOW
GLUCOSE: 150 mg/dL — AB (ref 70–99)
POTASSIUM: 4.7 meq/L (ref 3.5–5.3)
Sodium: 138 mEq/L (ref 135–145)

## 2014-03-29 LAB — GLUCOSE, CAPILLARY: Glucose-Capillary: 196 mg/dL — ABNORMAL HIGH (ref 70–99)

## 2014-03-29 LAB — POCT GLYCOSYLATED HEMOGLOBIN (HGB A1C): HEMOGLOBIN A1C: 7.3

## 2014-03-29 MED ORDER — LOSARTAN POTASSIUM 25 MG PO TABS: 25.0000 mg | ORAL_TABLET | Freq: Once | ORAL | Status: DC

## 2014-03-29 MED ORDER — GABAPENTIN 300 MG PO CAPS: 300.0000 mg | ORAL_CAPSULE | Freq: Three times a day (TID) | ORAL | Status: DC

## 2014-03-29 MED ORDER — PROMETHAZINE HCL 12.5 MG PO TABS: 12.5000 mg | ORAL_TABLET | Freq: Four times a day (QID) | ORAL | Status: DC | PRN

## 2014-03-29 NOTE — Progress Notes (Addendum)
[facility] Subjective:   Patient ID: [name] female   DOB: 10/20/1950 64 y.o.   MRN: [id number]  HPI: [name] is a 64 y.o. female with a PMH below who presents for routine follow up.  DM2:  Currently on Metformin 1g BID and glipizide 5mg  BID.  DM Retinopathy?? Per chart, does not appear to have Dx on last visit to [facility] in 2013.  Will need make sure patient has DM eye exam.  DM Neuropathy: See saw podiatry recently for pre-ulcerative calluses and discussed with him that she wants to try to start medication about her foot pain, he asked that she bring it up at her next pcp visit.  She currently reports her pain is 6-7/10.  She describes it as sharp and shooing.  Occurs more at night but happens all the time.  No alleviating or aggravating factors.  HTN: needs medication refill.  Reports home bp control has been great.  Weight loss: She notes ~15 lbs of weight loss since last visit.  She was switched from lantus to metformin not too long ago.  She is uptodate with her mammogram. She has not had a pap smear since 2006, she had colonoscopy in 2007 that was normal.  Her only other symptom is sweating frequently and feeling warm.  This can happen at all times of day.  Cough: she reports she never went for her CXR as her cough resolved.  Past Medical History  Diagnosis Date  . Diabetes mellitus   . Hypertension   . ANEMIA, CHRONIC DISEASE NEC 03/15/2006  . DEGENERATIVE JOINT DISEASE 05/06/2007    On going for >5 yrs No imaging to confirm Has tried ultram, mobic, neurontin which did not work Ambulance person tried once worked well    . 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 DIABETIC  RETINOPATHY 03/15/2006  . Hyperlipidemia 04/01/2011  . PANCREATITIS, CHRONIC 03/29/2006  . RENAL INSUFFICIENCY, CHRONIC 03/15/2006  . Peripheral arterial disease    Current Outpatient Prescriptions  Medication Sig Dispense Refill  . albuterol (PROVENTIL HFA) 108  (90 BASE) MCG/ACT inhaler Inhale 2 puffs into the lungs every 6 (six) hours as needed for wheezing. 6.7 g 6  . alendronate (FOSAMAX) 70 MG tablet Take 1 tablet (70 mg total) by mouth every 7 (seven) days. Take with a full glass of water on an empty stomach. Takes on Tues. 4 tablet 5  . Calcium Carbonate-Vitamin D (CALCIUM-VITAMIN D) 500-200 MG-UNIT per tablet Take 1 tablet by mouth 2 (two) times daily. 60 tablet 6  . gabapentin (NEURONTIN) 300 MG capsule Take 1 capsule (300 mg total) by mouth 3 (three) times daily. 90 capsule 2  . glipiZIDE (GLUCOTROL) 5 MG tablet Take 1 tablet (5 mg total) by mouth 2 (two) times daily before a meal. 180 tablet 3  . glucose blood (ACCU-CHEK AVIVA) test strip Use as instructed 100 each 12  . guaiFENesin-dextromethorphan (ROBITUSSIN DM) 100-10 MG/5ML syrup Take 5 mLs by mouth every 4 (four) hours as needed for cough. 236 mL 0  . ipratropium (ATROVENT HFA) 17 MCG/ACT inhaler Inhale 1-2 puffs into the lungs every 6 (six) hours as needed for wheezing. 1 Inhaler 2  . losartan (COZAAR) 25 MG tablet Take 1 tablet (25 mg total) by mouth once. 90 tablet 3  . Pancrelipase, Lip-Prot-Amyl, 24000 UNITS CPEP Take 2-3 capsules (48,000-72,000 Units total) by mouth 3 (three) times daily. With meals and snacks. No more than 10/24hours 180 capsule 11  .  polyethylene glycol (MIRALAX / GLYCOLAX) packet Take 17 g by mouth daily. 14 each 2  . promethazine (PHENERGAN) 12.5 MG tablet Take 1 tablet (12.5 mg total) by mouth every 6 (six) hours as needed for nausea or vomiting. 60 tablet 1  . sitaGLIPtin (JANUVIA) 50 MG tablet Take 1 tablet (50 mg total) by mouth daily. 30 tablet 2  . traMADol (ULTRAM) 50 MG tablet Take 1 tablet (50 mg total) by mouth every 8 (eight) hours as needed. 60 tablet 2  . travoprost, benzalkonium, (TRAVATAN) 0.004 % ophthalmic solution Place 1 drop into both eyes at bedtime.     No current facility-administered medications for this visit.   No family history on  file. History   Social History  . Marital Status: Single    Spouse Name: N/A    Number of Children: N/A  . Years of Education: N/A   Social History Main Topics  . Smoking status: Current Some Day Smoker -- 0.10 packs/day    Types: Cigarettes  . Smokeless tobacco: None  . Alcohol Use: 0.0 oz/week    0 Not specified per week     Comment: daily  . Drug Use: No  . Sexual Activity: None   Other Topics Concern  . None   Social History Narrative   Review of Systems: Review of Systems  Constitutional: Positive for weight loss. Negative for fever, chills and malaise/fatigue.  Eyes: Negative for blurred vision.  Respiratory: Negative for cough and shortness of breath.   Cardiovascular: Negative for chest pain and leg swelling.  Gastrointestinal: Positive for nausea, abdominal pain and diarrhea. Negative for heartburn and vomiting.  Genitourinary: Negative for dysuria.  Musculoskeletal: Negative for myalgias.  Skin: Negative for rash.  Neurological: Negative for dizziness and headaches.  Psychiatric/Behavioral: Negative for depression.     Objective:  Physical Exam: Filed Vitals:   03/29/14 1328  BP: 136/55  Pulse: 80  Temp: 98.2 F (36.8 C)  TempSrc: Oral  Height: 5\' 5"  (1.651 m)  Weight: 159 lb 6.4 oz (72.303 kg)  SpO2: 100%   Physical Exam  Constitutional: She is well-developed, well-nourished, and in no distress.  Neck: Normal range of motion. Neck supple. No thyromegaly present.  Cardiovascular: Normal rate, regular rhythm and normal heart sounds.   Pulmonary/Chest: Effort normal and breath sounds normal.  Abdominal: Soft. Bowel sounds are normal. She exhibits no distension. There is no tenderness.  Nursing note and vitals reviewed.   Wt Readings from Last 5 Encounters:  03/29/14 159 lb 6.4 oz (72.303 kg)  01/15/14 172 lb 8 oz (78.245 kg)  12/21/13 175 lb 14.4 oz (79.788 kg)  12/12/13 179 lb 4.8 oz (81.33 kg)  12/11/13 180 lb 1.6 oz (81.693 kg)     Assessment & Plan:  Case discussed with Dr. Beryle Beams  Loss of weight -Check TSH - have patient get up to date on [facility]. Due for pap smear, declined today ("not ready for that").  Will do at next visit. - Patient is still a smoker now down to 2cig a day, has a chest Xray ordered which she did not complete. Advised her she could still have this done, she reports she will.   Diabetes mellitus type 2 with retinopathy Lab Results  Component Value Date   HGBA1C 7.3 03/29/2014   HGBA1C 7.4* 12/02/2013   HGBA1C 7.9 04/10/2013     Assessment: Diabetes control: fair control Progress toward A1C goal:  improved Comments: on metformin 1g BID and glipizide 5mg  BID, lost 15  libs  Plan: Medications:  Will reduce Metformin to 500mg  BID (diarrhea) and continue Glipizide 5mg  BID Home glucose monitoring: Frequency: once a day Timing: before breakfast Instruction/counseling given: reminded to get eye exam, reminded to bring blood glucose meter & log to each visit, reminded to bring medications to each visit and discussed foot care Educational resources provided:   Self management tools provided:   Other plans: Will recheck BMP (need to monitor with metformin use)    ADDENDUM: SCr returned at 1.6.  Called pharmacy and d/c metformin Rx (especialy since her SCr has bounced around quite a bit lately).  Unable to reach patient at her phone number on file, will likely need to start alternative, will try Januvia at 50mg .  I will have our diabetes educator donna plyler also try to contact her about this change.  Will want her to follow up in 1 month.   Essential hypertension BP Readings from Last 3 Encounters:  03/29/14 136/55  03/14/14 130/70  01/31/14 119/64    Lab Results  Component Value Date   NA 140 12/02/2013   K 4.1 12/02/2013   CREATININE 1.10 12/02/2013    Assessment: Blood pressure control: controlled Progress toward BP goal:  at goal Comments:    Plan: Medications:  Losartan 25mg  daily Educational resources provided:   Self management tools provided:   Other plans: Repeat BMP    Osteoporosis - Appears to have been on Foasamax since 2013.  - Will repeat DEXA and patient can likely go on drug holiday.   Chronic kidney disease, stage II (mild) - Check BMP     Medications Ordered Meds ordered this encounter  Medications  . gabapentin (NEURONTIN) 300 MG capsule    Sig: Take 1 capsule (300 mg total) by mouth 3 (three) times daily.    Dispense:  90 capsule    Refill:  2  . losartan (COZAAR) 25 MG tablet    Sig: Take 1 tablet (25 mg total) by mouth once.    Dispense:  90 tablet    Refill:  3  . promethazine (PHENERGAN) 12.5 MG tablet    Sig: Take 1 tablet (12.5 mg total) by mouth every 6 (six) hours as needed for nausea or vomiting.    Dispense:  60 tablet    Refill:  1  . sitaGLIPtin (JANUVIA) 50 MG tablet    Sig: Take 1 tablet (50 mg total) by mouth daily.    Dispense:  30 tablet    Refill:  2   Other Orders Orders Placed This Encounter  Procedures  . DG Bone Density    Standing Status: Future     Number of Occurrences:      Standing Expiration Date: 05/28/2015    Order Specific Question:  Reason for Exam (SYMPTOM  OR DIAGNOSIS REQUIRED)    Answer:  Reevaluation of Osteoporosis    Order Specific Question:  Preferred imaging location?    Answer:  [facility]  . Glucose, capillary  . TSH  . BMP with Estimated GFR ([id number])  . POC Hbg A1C

## 2014-03-29 NOTE — Progress Notes (Signed)
Medicine attending: Medical history, presenting problems, physical findings, and medications, reviewed with Dr Erik Hoffman and I concur with his evaluation and management plan. 

## 2014-03-29 NOTE — Assessment & Plan Note (Signed)
BP Readings from Last 3 Encounters:  03/29/14 136/55  03/14/14 130/70  01/31/14 119/64    Lab Results  Component Value Date   NA 140 12/02/2013   K 4.1 12/02/2013   CREATININE 1.10 12/02/2013    Assessment: Blood pressure control: controlled Progress toward BP goal:  at goal Comments:   Plan: Medications:  Losartan 25mg  daily Educational resources provided:   Self management tools provided:   Other plans: Repeat BMP

## 2014-03-29 NOTE — Assessment & Plan Note (Addendum)
-  Check TSH - have patient get up to date on [place]. Due for pap smear, declined today ("not ready for that").  Will do at next visit. - Patient is still a smoker now down to 2cig a day, has a chest Xray ordered which she did not complete. Advised her she could still have this done, she reports she will.

## 2014-03-29 NOTE — Assessment & Plan Note (Signed)
-   Appears to have been on Foasamax since 2013.  - Will repeat DEXA and patient can likely go on drug holiday.

## 2014-03-29 NOTE — Assessment & Plan Note (Addendum)
Lab Results  Component Value Date   HGBA1C 7.3 03/29/2014   HGBA1C 7.4* 12/02/2013   HGBA1C 7.9 04/10/2013     Assessment: Diabetes control: fair control Progress toward A1C goal:  improved Comments: on metformin 1g BID and glipizide 5mg  BID, lost 15 libs  Plan: Medications:  Will reduce Metformin to 500mg  BID (diarrhea) and continue Glipizide 5mg  BID Home glucose monitoring: Frequency: once a day Timing: before breakfast Instruction/counseling given: reminded to get eye exam, reminded to bring blood glucose meter & log to each visit, reminded to bring medications to each visit and discussed foot care Educational resources provided:   Self management tools provided:   Other plans: Will recheck BMP (need to monitor with metformin use)    ADDENDUM: SCr returned at 1.6.  Called pharmacy and d/c metformin Rx (especialy since her SCr has bounced around quite a bit lately).  Unable to reach patient at her phone number on file, will likely need to start alternative, will try Januvia at 50mg .  I will have our diabetes educator donna plyler also try to contact her about this change.  Will want her to follow up in 1 month.

## 2014-03-29 NOTE — Patient Instructions (Signed)
General Instructions: Try going down to 1 tablet of metformin twice a day to see if this helps your diarrhea.  I want you to start gabapentin for your nerve pain.  I want you to start with 1 pill at night, you can gradually work up to 3 pills a day.  Please bring your medicines with you each time you come to clinic.  Medicines may include prescription medications, over-the-counter medications, herbal remedies, eye drops, vitamins, or other pills.   Progress Toward Treatment Goals:  Treatment Goal 01/15/2014  Hemoglobin A1C -  Blood pressure -  Stop smoking smoking the same amount    Self Care Goals & Plans:  Self Care Goal 01/15/2014  Manage my medications take my medicines as prescribed; bring my medications to every visit; refill my medications on time  Monitor my health keep track of my blood glucose; bring my glucose meter and log to each visit; keep track of my weight  Eat healthy foods drink diet soda or water instead of juice or soda; eat more vegetables; eat foods that are low in salt; eat baked foods instead of fried foods; eat fruit for snacks and desserts  Be physically active find an activity I enjoy  Stop smoking -    Home Blood Glucose Monitoring 01/15/2014  Check my blood sugar 3 times a day  When to check my blood sugar before meals     Care Management & Community Referrals:  Referral 12/21/2013  Referrals made for care management support none needed     Gabapentin capsules or tablets What is this medicine? GABAPENTIN (GA ba pen tin) is used to control partial seizures in adults with epilepsy. It is also used to treat certain types of nerve pain. This medicine may be used for other purposes; ask your health care provider or pharmacist if you have questions. COMMON BRAND NAME(S): Orpha Bur, Neurontin What should I tell my health care provider before I take this medicine? They need to know if you have any of these conditions: -kidney disease -suicidal  thoughts, plans, or attempt; a previous suicide attempt by you or a family member -an unusual or allergic reaction to gabapentin, other medicines, foods, dyes, or preservatives -pregnant or trying to get pregnant -breast-feeding How should I use this medicine? Take this medicine by mouth with a glass of water. Follow the directions on the prescription label. You can take it with or without food. If it upsets your stomach, take it with food.Take your medicine at regular intervals. Do not take it more often than directed. Do not stop taking except on your doctor's advice. If you are directed to break the 600 or 800 mg tablets in half as part of your dose, the extra half tablet should be used for the next dose. If you have not used the extra half tablet within 28 days, it should be thrown away. A special MedGuide will be given to you by the pharmacist with each prescription and refill. Be sure to read this information carefully each time. Talk to your pediatrician regarding the use of this medicine in children. Special care may be needed. Overdosage: If you think you have taken too much of this medicine contact a poison control center or emergency room at once. NOTE: This medicine is only for you. Do not share this medicine with others. What if I miss a dose? If you miss a dose, take it as soon as you can. If it is almost time for your next dose, take only  that dose. Do not take double or extra doses. What may interact with this medicine? Do not take this medicine with any of the following medications: -other gabapentin products This medicine may also interact with the following medications: -alcohol -antacids -antihistamines for allergy, cough and cold -certain medicines for anxiety or sleep -certain medicines for depression or psychotic disturbances -homatropine; hydrocodone -naproxen -narcotic medicines (opiates) for pain -phenothiazines like chlorpromazine, mesoridazine, prochlorperazine,  thioridazine This list may not describe all possible interactions. Give your health care provider a list of all the medicines, herbs, non-prescription drugs, or dietary supplements you use. Also tell them if you smoke, drink alcohol, or use illegal drugs. Some items may interact with your medicine. What should I watch for while using this medicine? Visit your doctor or health care professional for regular checks on your progress. You may want to keep a record at home of how you feel your condition is responding to treatment. You may want to share this information with your doctor or health care professional at each visit. You should contact your doctor or health care professional if your seizures get worse or if you have any new types of seizures. Do not stop taking this medicine or any of your seizure medicines unless instructed by your doctor or health care professional. Stopping your medicine suddenly can increase your seizures or their severity. Wear a medical identification bracelet or chain if you are taking this medicine for seizures, and carry a card that lists all your medications. You may get drowsy, dizzy, or have blurred vision. Do not drive, use machinery, or do anything that needs mental alertness until you know how this medicine affects you. To reduce dizzy or fainting spells, do not sit or stand up quickly, especially if you are an older patient. Alcohol can increase drowsiness and dizziness. Avoid alcoholic drinks. Your mouth may get dry. Chewing sugarless gum or sucking hard candy, and drinking plenty of water will help. The use of this medicine may increase the chance of suicidal thoughts or actions. Pay special attention to how you are responding while on this medicine. Any worsening of mood, or thoughts of suicide or dying should be reported to your health care professional right away. Women who become pregnant while using this medicine may enroll in the [place]  Pregnancy Registry by calling [phone]. This registry collects information about the safety of antiepileptic drug use during pregnancy. What side effects may I notice from receiving this medicine? Side effects that you should report to your doctor or health care professional as soon as possible: -allergic reactions like skin rash, itching or hives, swelling of the face, lips, or tongue -worsening of mood, thoughts or actions of suicide or dying Side effects that usually do not require medical attention (report to your doctor or health care professional if they continue or are bothersome): -constipation -difficulty walking or controlling muscle movements -dizziness -nausea -slurred speech -tiredness -tremors -weight gain This list may not describe all possible side effects. Call your doctor for medical advice about side effects. You may report side effects to FDA at 1-800-FDA-1088. Where should I keep my medicine? Keep out of reach of children. Store at room temperature between 15 and 30 degrees C (59 and 86 degrees F). Throw away any unused medicine after the expiration date. NOTE: This sheet is a summary. It may not cover all possible information. If you have questions about this medicine, talk to your doctor, pharmacist, or health care provider.  2015, Elsevier/Gold  Standard. (2012-10-13 09:12:48)

## 2014-03-29 NOTE — Assessment & Plan Note (Signed)
Check BMP .  

## 2014-03-30 ENCOUNTER — Telehealth: Payer: Self-pay | Admitting: Dietician

## 2014-03-30 ENCOUNTER — Telehealth: Payer: Self-pay | Admitting: Pharmacist

## 2014-03-30 LAB — TSH: TSH: 1.539 u[IU]/mL (ref 0.350–4.500)

## 2014-03-30 MED ORDER — SITAGLIPTIN PHOSPHATE 50 MG PO TABS: 50.0000 mg | ORAL_TABLET | Freq: Every day | ORAL | Status: DC

## 2014-03-30 NOTE — Telephone Encounter (Signed)
Per pharmacy patient's delivery already went out today. Called patient and she says she'll get a ride to get to pharmacy to get it.   She is also concerned about taking the [place] reading the papers given to her that says "tell your doctor if you have chronic kidney disease". She also asks about her kidneys,"what should I expect, how bad are they, no one ever told me about my kidneys and that is serious " CDE told her that I would let our pharmacist or an attending physician or Dr. Heber [place] know about her question about taking gabapentin and concerns about her kidney disease and we'd call her back.

## 2014-03-30 NOTE — Addendum Note (Signed)
Addended by: Joni Reining C on: 03/30/2014 11:52 AM   Modules accepted: Orders, Medications

## 2014-03-30 NOTE — Telephone Encounter (Signed)
Per Dr. Jodene Nam orders, he requested patient be called and told to "Stop glucophage/metformin, start Januvia, follow up in 1 month". Patient verbalized understanding by teachback. CDE tried calling [facility] to let them know as patient said they are delivering her medication today and she doesn't have the money to pay them again or get there this weeend.

## 2014-04-02 NOTE — Telephone Encounter (Signed)
Contacted patient regarding medication concern and she states she is concerned with medications and renal function. Notified patient to continue taking medications as prescribed and we will re-evaluate her renal function within 1 week for a trend. Patient verbalized understanding.  Medications to consider for renal adjustment at follow-up  Alendronate  Gabapentin  Losartan  Sitagliptin  Calcium carbonate (montior serum Ca levels)  If renal function improves and stabilizes, consider re-initiation of metformin

## 2014-04-04 ENCOUNTER — Ambulatory Visit (INDEPENDENT_AMBULATORY_CARE_PROVIDER_SITE_OTHER): Payer: Medicaid Other | Admitting: Podiatry

## 2014-04-04 ENCOUNTER — Telehealth: Payer: Self-pay | Admitting: Internal Medicine

## 2014-04-04 ENCOUNTER — Encounter: Payer: Self-pay | Admitting: Podiatry

## 2014-04-04 VITALS — BP 120/59 | HR 84 | Temp 98.1°F | Resp 13

## 2014-04-04 DIAGNOSIS — M79676 Pain in unspecified toe(s): Secondary | ICD-10-CM

## 2014-04-04 DIAGNOSIS — L97511 Non-pressure chronic ulcer of other part of right foot limited to breakdown of skin: Secondary | ICD-10-CM | POA: Diagnosis not present

## 2014-04-04 DIAGNOSIS — B351 Tinea unguium: Secondary | ICD-10-CM

## 2014-04-04 DIAGNOSIS — L97501 Non-pressure chronic ulcer of other part of unspecified foot limited to breakdown of skin: Secondary | ICD-10-CM

## 2014-04-04 NOTE — Patient Instructions (Signed)
Apply Silvadene cream to skin ulcer on the base of fifth right toe daily and cover with gauze until a callus forms Diabetes and Foot Care Diabetes may cause you to have problems because of poor blood supply (circulation) to your feet and legs. This may cause the skin on your feet to become thinner, break easier, and heal more slowly. Your skin may become dry, and the skin may peel and crack. You may also have nerve damage in your legs and feet causing decreased feeling in them. You may not notice minor injuries to your feet that could lead to infections or more serious problems. Taking care of your feet is one of the most important things you can do for yourself.  HOME CARE INSTRUCTIONS  Wear shoes at all times, even in the house. Do not go barefoot. Bare feet are easily injured.  Check your feet daily for blisters, cuts, and redness. If you cannot see the bottom of your feet, use a mirror or ask someone for help.  Wash your feet with warm water (do not use hot water) and mild soap. Then pat your feet and the areas between your toes until they are completely dry. Do not soak your feet as this can dry your skin.  Apply a moisturizing lotion or petroleum jelly (that does not contain alcohol and is unscented) to the skin on your feet and to dry, brittle toenails. Do not apply lotion between your toes.  Trim your toenails straight across. Do not dig under them or around the cuticle. File the edges of your nails with an emery board or nail file.  Do not cut corns or calluses or try to remove them with medicine.  Wear clean socks or stockings every day. Make sure they are not too tight. Do not wear knee-high stockings since they may decrease blood flow to your legs.  Wear shoes that fit properly and have enough cushioning. To break in new shoes, wear them for just a few hours a day. This prevents you from injuring your feet. Always look in your shoes before you put them on to be sure there are no  objects inside.  Do not cross your legs. This may decrease the blood flow to your feet.  If you find a minor scrape, cut, or break in the skin on your feet, keep it and the skin around it clean and dry. These areas may be cleansed with mild soap and water. Do not cleanse the area with peroxide, alcohol, or iodine.  When you remove an adhesive bandage, be sure not to damage the skin around it.  If you have a wound, look at it several times a day to make sure it is healing.  Do not use heating pads or hot water bottles. They may burn your skin. If you have lost feeling in your feet or legs, you may not know it is happening until it is too late.  Make sure your health care provider performs a complete foot exam at least annually or more often if you have foot problems. Report any cuts, sores, or bruises to your health care provider immediately. SEEK MEDICAL CARE IF:   You have an injury that is not healing.  You have cuts or breaks in the skin.  You have an ingrown nail.  You notice redness on your legs or feet.  You feel burning or tingling in your legs or feet.  You have pain or cramps in your legs and feet.  Your  legs or feet are numb.  Your feet always feel cold. SEEK IMMEDIATE MEDICAL CARE IF:   There is increasing redness, swelling, or pain in or around a wound.  There is a red line that goes up your leg.  Pus is coming from a wound.  You develop a fever or as directed by your health care provider.  You notice a bad smell coming from an ulcer or wound. Document Released: 02/07/2000 Document Revised: 10/12/2012 Document Reviewed: 07/19/2012 [facility] Patient Information 2015 [place], [place]. This information is not intended to replace advice given to you by your health care provider. Make sure you discuss any questions you have with your health care provider.

## 2014-04-04 NOTE — Telephone Encounter (Signed)
Call to patient to confirm appointment for 04/05/14 at 2:15 lmtcb

## 2014-04-04 NOTE — Progress Notes (Signed)
Patient ID: [name], female   DOB: Apr 07, 1950, 64 y.o.   MRN: [id number]  Subjective: This patient presents today complaining of painful toenails and ongoing debridement of pre-ulcerative and ulcerative skin lesions on the right and left feet  Objective: Orientated 3 The toenails are elongated, brittle, discolored, hypertrophic and tender to palpation 6-10 Superficial skin ulcer plantar fifth right MPJ Bleeding callus plantar right first MPJ Plantar keratoses left first MPJ Punctate keratoses left heel  Assessment: History of peripheral arterial disease History of diabetic peripheral neuropathy Symptomatic onychomycoses 6-10 Superficial ulceration fifth right MPJ Pre-ulcerative callus plantar right first MPJ Keratoses 2 left  Plan: Debrided toenails 10 without a bleeding Debrided plantar ulcer fifth right MPJ and dressed with Silvadene Debridement of bleeding callus right first MPJ Debridement of plantar callus sub-left first MPJ and left heel   Patient will apply Silvadene cream to skin ulcer on the can ulcer on the plantar fifth right MPJ until callus forms She will continue wear diabetic shoes  Reappoint 3 weeks

## 2014-04-05 ENCOUNTER — Encounter: Payer: Medicaid Other | Admitting: Internal Medicine

## 2014-04-17 ENCOUNTER — Telehealth: Payer: Self-pay | Admitting: *Deleted

## 2014-04-17 NOTE — Telephone Encounter (Signed)
Pt called to state the Neurontin that was started 03/29/14 has caused her legs ( up to thighs ) and ankles to swell.  Patient read the side effects of Neurontin and she noted this swelling.  She stopped taking meds on Sunday and swelling has resolved.  She is calling to see what you want her to do. Pt # [id number]

## 2014-04-18 NOTE — Telephone Encounter (Signed)
Talked with Dr Heber [place], he asked that pt schedule an appointment to talk about the Neurontin.  Pt called, no answer.  Message left.  Will call again

## 2014-04-19 ENCOUNTER — Telehealth: Payer: Self-pay | Admitting: *Deleted

## 2014-04-19 ENCOUNTER — Ambulatory Visit: Payer: Medicaid Other | Admitting: Internal Medicine

## 2014-04-19 NOTE — Telephone Encounter (Signed)
Pt states she believes the gabapentin was causing her to have swelling, she was to see dr Heber [place] but has no transportation, she has stopped the med and rescheduled her appt for 3/10 at 1415, she also wants to talk to him about something personal that she can only talk to him about. At this time she would like for dr Heber [place] to call something in to replace the gabapentin. You may call her at [phone]

## 2014-04-19 NOTE — Telephone Encounter (Signed)
Called patient, confirmed that she should stay off gabapentin for now.  Suggested that we may be able to seek alternatives at next visit.  She was also concerned about Januvia and CKD after reading the bottle.  I reassured her I had already reduced the dose by half for her kidney function.

## 2014-04-19 NOTE — Telephone Encounter (Signed)
Talked with patient and she will schedule appointment

## 2014-04-25 ENCOUNTER — Encounter: Payer: Self-pay | Admitting: Podiatry

## 2014-04-25 ENCOUNTER — Ambulatory Visit (INDEPENDENT_AMBULATORY_CARE_PROVIDER_SITE_OTHER): Payer: Medicaid Other | Admitting: Podiatry

## 2014-04-25 DIAGNOSIS — E1142 Type 2 diabetes mellitus with diabetic polyneuropathy: Secondary | ICD-10-CM

## 2014-04-25 DIAGNOSIS — I739 Peripheral vascular disease, unspecified: Secondary | ICD-10-CM | POA: Diagnosis not present

## 2014-04-25 DIAGNOSIS — L84 Corns and callosities: Secondary | ICD-10-CM | POA: Diagnosis not present

## 2014-04-25 NOTE — Patient Instructions (Signed)
Apply Silvadene cream to skin ulcer base of fifth right toe until drainage stops Continue to wear diabetic shoes  Diabetes and Foot Care Diabetes may cause you to have problems because of poor blood supply (circulation) to your feet and legs. This may cause the skin on your feet to become thinner, break easier, and heal more slowly. Your skin may become dry, and the skin may peel and crack. You may also have nerve damage in your legs and feet causing decreased feeling in them. You may not notice minor injuries to your feet that could lead to infections or more serious problems. Taking care of your feet is one of the most important things you can do for yourself.  HOME CARE INSTRUCTIONS  Wear shoes at all times, even in the house. Do not go barefoot. Bare feet are easily injured.  Check your feet daily for blisters, cuts, and redness. If you cannot see the bottom of your feet, use a mirror or ask someone for help.  Wash your feet with warm water (do not use hot water) and mild soap. Then pat your feet and the areas between your toes until they are completely dry. Do not soak your feet as this can dry your skin.  Apply a moisturizing lotion or petroleum jelly (that does not contain alcohol and is unscented) to the skin on your feet and to dry, brittle toenails. Do not apply lotion between your toes.  Trim your toenails straight across. Do not dig under them or around the cuticle. File the edges of your nails with an emery board or nail file.  Do not cut corns or calluses or try to remove them with medicine.  Wear clean socks or stockings every day. Make sure they are not too tight. Do not wear knee-high stockings since they may decrease blood flow to your legs.  Wear shoes that fit properly and have enough cushioning. To break in new shoes, wear them for just a few hours a day. This prevents you from injuring your feet. Always look in your shoes before you put them on to be sure there are no  objects inside.  Do not cross your legs. This may decrease the blood flow to your feet.  If you find a minor scrape, cut, or break in the skin on your feet, keep it and the skin around it clean and dry. These areas may be cleansed with mild soap and water. Do not cleanse the area with peroxide, alcohol, or iodine.  When you remove an adhesive bandage, be sure not to damage the skin around it.  If you have a wound, look at it several times a day to make sure it is healing.  Do not use heating pads or hot water bottles. They may burn your skin. If you have lost feeling in your feet or legs, you may not know it is happening until it is too late.  Make sure your health care provider performs a complete foot exam at least annually or more often if you have foot problems. Report any cuts, sores, or bruises to your health care provider immediately. SEEK MEDICAL CARE IF:   You have an injury that is not healing.  You have cuts or breaks in the skin.  You have an ingrown nail.  You notice redness on your legs or feet.  You feel burning or tingling in your legs or feet.  You have pain or cramps in your legs and feet.  Your legs or  feet are numb.  Your feet always feel cold. SEEK IMMEDIATE MEDICAL CARE IF:   There is increasing redness, swelling, or pain in or around a wound.  There is a red line that goes up your leg.  Pus is coming from a wound.  You develop a fever or as directed by your health care provider.  You notice a bad smell coming from an ulcer or wound. Document Released: 02/07/2000 Document Revised: 10/12/2012 Document Reviewed: 07/19/2012 [facility] Patient Information 2015 [place], [place]. This information is not intended to replace advice given to you by your health care provider. Make sure you discuss any questions you have with your health care provider.

## 2014-04-25 NOTE — Progress Notes (Signed)
Patient ID: [name], female   DOB: [birth year], 64 y.o.   MRN: [id number]  Subjective: Patient presents again for ongoing treatment for pre-ulcerative in ulcerative plantar keratoses and right and left feet at approximately 3 week intervals. Patient states that she had to DC gabapentin because edema.  Objective: Right foot Superficial ulceration plantar fifth right MPJ with granular base with hyperkeratotic tissue Nonhemorrhagic plantar keratoses right first MPJ  Left foot Nonhemorrhagic plantar keratoses left first MPJ  Assessment: History of peripheral arterial disease Diabetic peripheral neuropathy  Pre-ulcerative callus and keratoses  Plan: Debrided pre-ulcerative callus and keratoses Patient advised to apply Silvadene to the ulcer on the fifth right MPJ if she notices any drainage Continue to wear diabetic shoes   Reappoint 3 weeks

## 2014-04-27 ENCOUNTER — Telehealth: Payer: Self-pay | Admitting: *Deleted

## 2014-04-27 NOTE — Telephone Encounter (Signed)
Pt needs PA on losartan, can you prescribe something else? She is upset

## 2014-04-27 NOTE — Telephone Encounter (Signed)
Called patient back, has been without losartan for a little while, notes her BP has been "normal" off of it.  I told her were are in the process of filling out her paperwork and we will get it done as she does need an ARB given she was intolerant to ACEi Lisinopril.    Told her it will be fine to remain off of losartan for a few days and we will see her on Thursday.  She is agreeable to this plan.

## 2014-04-30 NOTE — Telephone Encounter (Signed)
PA request submitted online via  Tracks-request sent for review.Despina Hidden Cassady3/7/201610:40 AM    Confirmation #: [id number]

## 2014-04-30 NOTE — Telephone Encounter (Signed)
Spoken to [place] about PA

## 2014-04-30 NOTE — Telephone Encounter (Signed)
APPROVED 04/30/2014 - 04/30/2015 [facility].  Will inform pt and pharmacy.Regenia Skeeter, Darlene Cassady3/7/201611:24 AM

## 2014-05-01 ENCOUNTER — Telehealth: Payer: Self-pay | Admitting: Dietician

## 2014-05-01 NOTE — Telephone Encounter (Signed)
Pt has received this Rx.

## 2014-05-01 NOTE — Telephone Encounter (Signed)
Called patient about her eye doctor appointment: she made one at [facility] for 05/07/14. She says she still has not gotten losartan and per her pharmacy it is waiting for authorization from [place].  She also says he blood sugars are "not good" on januvia and glipizide and were much better on glucophage. Explained to her why she was taken off glucophage per her request

## 2014-05-03 ENCOUNTER — Encounter: Payer: Self-pay | Admitting: Internal Medicine

## 2014-05-03 ENCOUNTER — Ambulatory Visit (INDEPENDENT_AMBULATORY_CARE_PROVIDER_SITE_OTHER): Payer: Medicaid Other | Admitting: Internal Medicine

## 2014-05-03 VITALS — BP 132/45 | HR 100 | Temp 98.0°F | Wt 160.5 lb

## 2014-05-03 DIAGNOSIS — N183 Chronic kidney disease, stage 3 (moderate): Secondary | ICD-10-CM

## 2014-05-03 DIAGNOSIS — E1140 Type 2 diabetes mellitus with diabetic neuropathy, unspecified: Secondary | ICD-10-CM

## 2014-05-03 DIAGNOSIS — E11319 Type 2 diabetes mellitus with unspecified diabetic retinopathy without macular edema: Secondary | ICD-10-CM

## 2014-05-03 DIAGNOSIS — I10 Essential (primary) hypertension: Secondary | ICD-10-CM

## 2014-05-03 DIAGNOSIS — E1122 Type 2 diabetes mellitus with diabetic chronic kidney disease: Secondary | ICD-10-CM

## 2014-05-03 LAB — BASIC METABOLIC PANEL WITH GFR
BUN: 23 mg/dL (ref 6–23)
CO2: 18 mEq/L — ABNORMAL LOW (ref 19–32)
Calcium: 8 mg/dL — ABNORMAL LOW (ref 8.4–10.5)
Chloride: 103 mEq/L (ref 96–112)
Creat: 1.35 mg/dL — ABNORMAL HIGH (ref 0.50–1.10)
GFR, EST NON AFRICAN AMERICAN: 42 mL/min — AB
GFR, Est African American: 49 mL/min — ABNORMAL LOW
Glucose, Bld: 336 mg/dL — ABNORMAL HIGH (ref 70–99)
Potassium: 4.1 mEq/L (ref 3.5–5.3)
Sodium: 132 mEq/L — ABNORMAL LOW (ref 135–145)

## 2014-05-03 MED ORDER — AMITRIPTYLINE HCL 25 MG PO TABS: 25.0000 mg | ORAL_TABLET | Freq: Every day | ORAL | Status: DC

## 2014-05-03 NOTE — Progress Notes (Signed)
Internal Medicine Clinic Attending  Case discussed with Dr. Hoffman at the time of the visit.  We reviewed the resident's history and exam and pertinent patient test results.  I agree with the assessment, diagnosis, and plan of care documented in the resident's note.  

## 2014-05-03 NOTE — Assessment & Plan Note (Signed)
BP Readings from Last 3 Encounters:  05/03/14 132/45  04/04/14 120/59  03/29/14 136/55    Lab Results  Component Value Date   NA 138 03/29/2014   K 4.7 03/29/2014   CREATININE 1.67* 03/29/2014    Assessment: Blood pressure control: controlled Progress toward BP goal:  at goal Comments:   Plan: Medications:  Losartan 25mg  daily Educational resources provided:   Self management tools provided:   Other plans: Repeat BMP

## 2014-05-03 NOTE — Assessment & Plan Note (Signed)
-  Repeat BMP today, if SCr stable or improved may be able to restart low dose of Metformin.

## 2014-05-03 NOTE — Assessment & Plan Note (Signed)
-   Unable to tolerate Gabapentin (but effective) 05/03/14 Trial of Amitryptline 25mg  [facility] if fails will try prior auth for lyrica.

## 2014-05-03 NOTE — Assessment & Plan Note (Addendum)
Lab Results  Component Value Date   HGBA1C 7.3 03/29/2014   HGBA1C 7.4* 12/02/2013   HGBA1C 7.9 04/10/2013     Assessment: Diabetes control: fair control Progress toward A1C goal:  unable to assess Comments:   Plan: Medications:  Januvia 50mg  daily, Glipizide 5mg  BID Home glucose monitoring: Frequency:   Timing:   Instruction/counseling given: reminded to get eye exam and reminded to bring medications to each visit Educational resources provided:   Self management tools provided:   Other plans: Recheck BMP if SCr stable or improved may be able to start back Metformin 500mg  daily. ADDENDUM: SCr improved will allow to resume Metformin 500mg  BID, will need BMP at least every 3-6 months for monitoring, would not go above 500mg  BID.

## 2014-05-03 NOTE — Patient Instructions (Addendum)
General Instructions: Please try taking 25mg  of Amitriptyline at night for your nerve pain.  I will call with the results of your blood work and if you can restart Metformin.  Please bring your medicines with you each time you come to clinic.  Medicines may include prescription medications, over-the-counter medications, herbal remedies, eye drops, vitamins, or other pills.   Progress Toward Treatment Goals:  Treatment Goal 03/29/2014  Hemoglobin A1C improved  Blood pressure at goal  Stop smoking smoking less    Self Care Goals & Plans:  Self Care Goal 03/29/2014  Manage my medications take my medicines as prescribed; bring my medications to every visit; refill my medications on time  Monitor my health keep track of my blood glucose; bring my glucose meter and log to each visit; keep track of my weight  Eat healthy foods drink diet soda or water instead of juice or soda; eat more vegetables; eat foods that are low in salt; eat baked foods instead of fried foods; eat fruit for snacks and desserts  Be physically active find an activity I enjoy  Stop smoking set a quit date and stop smoking    Home Blood Glucose Monitoring 03/29/2014  Check my blood sugar once a day  When to check my blood sugar before breakfast     Care Management & Community Referrals:  Referral 03/29/2014  Referrals made for care management support none needed       Amitriptyline tablets What is this medicine? AMITRIPTYLINE (a mee TRIP ti leen) is used to treat depression. This medicine may be used for other purposes; ask your health care provider or pharmacist if you have questions. COMMON BRAND NAME(S): Elavil, Vanatrip What should I tell my health care provider before I take this medicine? They need to know if you have any of these conditions: -an alcohol problem -asthma, difficulty breathing -bipolar disorder or schizophrenia -difficulty passing urine, prostate trouble -glaucoma -heart disease or  previous heart attack -liver disease -over active thyroid -seizures -thoughts or plans of suicide, a previous suicide attempt, or family history of suicide attempt -an unusual or allergic reaction to amitriptyline, other medicines, foods, dyes, or preservatives -pregnant or trying to get pregnant -breast-feeding How should I use this medicine? Take this medicine by mouth with a drink of water. Follow the directions on the prescription label. You can take the tablets with or without food. Take your medicine at regular intervals. Do not take it more often than directed. Do not stop taking this medicine suddenly except upon the advice of your doctor. Stopping this medicine too quickly may cause serious side effects or your condition may worsen. A special MedGuide will be given to you by the pharmacist with each prescription and refill. Be sure to read this information carefully each time. Talk to your pediatrician regarding the use of this medicine in children. Special care may be needed. Overdosage: If you think you have taken too much of this medicine contact a poison control center or emergency room at once. NOTE: This medicine is only for you. Do not share this medicine with others. What if I miss a dose? If you miss a dose, take it as soon as you can. If it is almost time for your next dose, take only that dose. Do not take double or extra doses. What may interact with this medicine? Do not take this medicine with any of the following medications: -arsenic trioxide -certain medicines used to regulate abnormal heartbeat or to treat other heart  conditions -cisapride -droperidol -halofantrine -linezolid -MAOIs like Carbex, Eldepryl, Marplan, Nardil, and Parnate -methylene blue -other medicines for mental depression -phenothiazines like perphenazine, thioridazine and chlorpromazine -pimozide -probucol -procarbazine -sparfloxacin -St. John's Wort -ziprasidone This medicine may also  interact with the following medications: -atropine and related drugs like hyoscyamine, scopolamine, tolterodine and others -barbiturate medicines for inducing sleep or treating seizures, like phenobarbital -cimetidine -disulfiram -ethchlorvynol -thyroid hormones such as levothyroxine This list may not describe all possible interactions. Give your health care provider a list of all the medicines, herbs, non-prescription drugs, or dietary supplements you use. Also tell them if you smoke, drink alcohol, or use illegal drugs. Some items may interact with your medicine. What should I watch for while using this medicine? Tell your doctor if your symptoms do not get better or if they get worse. Visit your doctor or health care professional for regular checks on your progress. Because it may take several weeks to see the full effects of this medicine, it is important to continue your treatment as prescribed by your doctor. Patients and their families should watch out for new or worsening thoughts of suicide or depression. Also watch out for sudden changes in feelings such as feeling anxious, agitated, panicky, irritable, hostile, aggressive, impulsive, severely restless, overly excited and hyperactive, or not being able to sleep. If this happens, especially at the beginning of treatment or after a change in dose, call your health care professional. Dennis Bast may get drowsy or dizzy. Do not drive, use machinery, or do anything that needs mental alertness until you know how this medicine affects you. Do not stand or sit up quickly, especially if you are an older patient. This reduces the risk of dizzy or fainting spells. Alcohol may interfere with the effect of this medicine. Avoid alcoholic drinks. Do not treat yourself for coughs, colds, or allergies without asking your doctor or health care professional for advice. Some ingredients can increase possible side effects. Your mouth may get dry. Chewing sugarless gum or  sucking hard candy, and drinking plenty of water will help. Contact your doctor if the problem does not go away or is severe. This medicine may cause dry eyes and blurred vision. If you wear contact lenses you may feel some discomfort. Lubricating drops may help. See your eye doctor if the problem does not go away or is severe. This medicine can cause constipation. Try to have a bowel movement at least every 2 to 3 days. If you do not have a bowel movement for 3 days, call your doctor or health care professional. This medicine can make you more sensitive to the sun. Keep out of the sun. If you cannot avoid being in the sun, wear protective clothing and use sunscreen. Do not use sun lamps or tanning beds/booths. What side effects may I notice from receiving this medicine? Side effects that you should report to your doctor or health care professional as soon as possible: -allergic reactions like skin rash, itching or hives, swelling of the face, lips, or tongue -abnormal production of milk in females -breast enlargement in both males and females -breathing problems -confusion, hallucinations -fast, irregular heartbeat -fever with increased sweating -muscle stiffness, or spasms -pain or difficulty passing urine, loss of bladder control -seizures -suicidal thoughts or other mood changes -swelling of the testicles -tingling, pain, or numbness in the feet or hands -yellowing of the eyes or skin Side effects that usually do not require medical attention (report to your doctor or health care professional if  they continue or are bothersome): -change in sex drive or performance -constipation or diarrhea -nausea, vomiting -weight gain or loss This list may not describe all possible side effects. Call your doctor for medical advice about side effects. You may report side effects to FDA at 1-800-FDA-1088. Where should I keep my medicine? Keep out of the reach of children. Store at room temperature  between 20 and 25 degrees C (68 and 77 degrees F). Throw away any unused medicine after the expiration date. NOTE: This sheet is a summary. It may not cover all possible information. If you have questions about this medicine, talk to your doctor, pharmacist, or health care provider.  2015, [employer]. (2011-06-29 13:50:32)

## 2014-05-03 NOTE — Progress Notes (Signed)
[facility] Subjective:   Patient ID: [name] female   DOB: 1951/02/19 64 y.o.   MRN: [id number]  HPI: [name] is a 64 y.o. female with a PMH below who presents for follow up  T2DM:  Patient reports she has been compliant with her medications including januvia but that her sugars have been high, she does not bring her meter in today or remember specific readings.  Neuropathy: she notes gabapentin helped greatly with the pain but had to stop due to lower extremity swelling.  HTN: well controlled no issues    Past Medical History  Diagnosis Date  . Diabetes mellitus   . Hypertension   . ANEMIA, CHRONIC DISEASE NEC 03/15/2006  . DEGENERATIVE JOINT DISEASE 05/06/2007    On going for >5 yrs No imaging to confirm Has tried ultram, mobic, neurontin which did not work Ambulance person tried once worked well    . 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 DIABETIC  RETINOPATHY 03/15/2006  . Hyperlipidemia 04/01/2011  . PANCREATITIS, CHRONIC 03/29/2006  . RENAL INSUFFICIENCY, CHRONIC 03/15/2006  . Peripheral arterial disease    Current Outpatient Prescriptions  Medication Sig Dispense Refill  . albuterol (PROVENTIL HFA) 108 (90 BASE) MCG/ACT inhaler Inhale 2 puffs into the lungs every 6 (six) hours as needed for wheezing. 6.7 g 6  . alendronate (FOSAMAX) 70 MG tablet Take 1 tablet (70 mg total) by mouth every 7 (seven) days. Take with a full glass of water on an empty stomach. Takes on Tues. 4 tablet 5  . amitriptyline (ELAVIL) 25 MG tablet Take 1 tablet (25 mg total) by mouth at bedtime. 90 tablet 0  . Calcium Carbonate-Vitamin D (CALCIUM-VITAMIN D) 500-200 MG-UNIT per tablet Take 1 tablet by mouth 2 (two) times daily. 60 tablet 6  . gabapentin (NEURONTIN) 300 MG capsule Take 1 capsule (300 mg total) by mouth 3 (three) times daily. 90 capsule 2  . glipiZIDE (GLUCOTROL) 5 MG tablet Take 1 tablet (5 mg total) by mouth 2 (two) times daily before a meal. 180 tablet 3   . glucose blood (ACCU-CHEK AVIVA) test strip Use as instructed 100 each 12  . guaiFENesin-dextromethorphan (ROBITUSSIN DM) 100-10 MG/5ML syrup Take 5 mLs by mouth every 4 (four) hours as needed for cough. 236 mL 0  . ipratropium (ATROVENT HFA) 17 MCG/ACT inhaler Inhale 1-2 puffs into the lungs every 6 (six) hours as needed for wheezing. 1 Inhaler 2  . losartan (COZAAR) 25 MG tablet Take 1 tablet (25 mg total) by mouth once. 90 tablet 3  . Pancrelipase, Lip-Prot-Amyl, 24000 UNITS CPEP Take 2-3 capsules (48,000-72,000 Units total) by mouth 3 (three) times daily. With meals and snacks. No more than 10/24hours 180 capsule 11  . polyethylene glycol (MIRALAX / GLYCOLAX) packet Take 17 g by mouth daily. 14 each 2  . promethazine (PHENERGAN) 12.5 MG tablet Take 1 tablet (12.5 mg total) by mouth every 6 (six) hours as needed for nausea or vomiting. 60 tablet 1  . sitaGLIPtin (JANUVIA) 50 MG tablet Take 1 tablet (50 mg total) by mouth daily. 30 tablet 2  . traMADol (ULTRAM) 50 MG tablet Take 1 tablet (50 mg total) by mouth every 8 (eight) hours as needed. 60 tablet 2  . travoprost, benzalkonium, (TRAVATAN) 0.004 % ophthalmic solution Place 1 drop into both eyes at bedtime.     No current facility-administered medications for this visit.   No family history on file. History   Social History  . Marital  Status: Single    Spouse Name: N/A  . Number of Children: N/A  . Years of Education: N/A   Social History Main Topics  . Smoking status: Current Some Day Smoker -- 0.10 packs/day    Types: Cigarettes  . Smokeless tobacco: Not on file  . Alcohol Use: 0.0 oz/week    0 Standard drinks or equivalent per week     Comment: daily  . Drug Use: No  . Sexual Activity: Not on file   Other Topics Concern  . None   Social History Narrative   Review of Systems: Review of Systems  Constitutional: Negative for fever and chills.  Eyes: Negative for blurred vision.  Respiratory: Negative for cough and  shortness of breath.   Cardiovascular: Negative for chest pain.  Gastrointestinal: Negative for abdominal pain.  Genitourinary: Negative for dysuria and frequency.  Neurological: Negative for headaches.  Psychiatric/Behavioral: Negative for depression.     Objective:  Physical Exam: Filed Vitals:   05/03/14 1423  BP: 132/45  Pulse: 100  Temp: 98 F (36.7 C)  TempSrc: Oral  Weight: 160 lb 8 oz (72.802 kg)  SpO2: 100%  Physical Exam  Constitutional: She is well-developed, well-nourished, and in no distress.  HENT:  Head: Normocephalic and atraumatic.  Cardiovascular: Normal rate and regular rhythm.   Pulmonary/Chest: Effort normal and breath sounds normal.  Abdominal: Soft. Bowel sounds are normal.  Musculoskeletal: She exhibits edema (trace bilateral).  Psychiatric: Affect normal.  Nursing note and vitals reviewed.   Assessment & Plan:  Case discussed with Dr. Lynnae January  Essential hypertension BP Readings from Last 3 Encounters:  05/03/14 132/45  04/04/14 120/59  03/29/14 136/55    Lab Results  Component Value Date   NA 138 03/29/2014   K 4.7 03/29/2014   CREATININE 1.67* 03/29/2014    Assessment: Blood pressure control: controlled Progress toward BP goal:  at goal Comments:   Plan: Medications:  Losartan 25mg  daily Educational resources provided:   Self management tools provided:   Other plans: Repeat BMP      Diabetic neuropathy, painful - Unable to tolerate Gabapentin (but effective) 05/03/14 Trial of Amitryptline 25mg  QHS if fails will try prior auth for lyrica.   CKD stage 3 due to type 2 diabetes mellitus -Repeat BMP today, if SCr stable or improved may be able to restart low dose of Metformin.   Diabetes mellitus type 2 with retinopathy Lab Results  Component Value Date   HGBA1C 7.3 03/29/2014   HGBA1C 7.4* 12/02/2013   HGBA1C 7.9 04/10/2013     Assessment: Diabetes control: fair control Progress toward A1C goal:  unable to  assess Comments:   Plan: Medications:  Januvia 50mg  daily, Glipizide 5mg  BID Home glucose monitoring: Frequency:   Timing:   Instruction/counseling given: reminded to get eye exam and reminded to bring medications to each visit Educational resources provided:   Self management tools provided:   Other plans: Recheck BMP if SCr stable or improved may be able to start back Metformin 500mg  daily.        Medications Ordered Meds ordered this encounter  Medications  . amitriptyline (ELAVIL) 25 MG tablet    Sig: Take 1 tablet (25 mg total) by mouth at bedtime.    Dispense:  90 tablet    Refill:  0   Other Orders Orders Placed This Encounter  Procedures  . BMP with Estimated GFR ([id number])

## 2014-05-04 ENCOUNTER — Encounter: Payer: Self-pay | Admitting: Internal Medicine

## 2014-05-04 MED ORDER — METFORMIN HCL 500 MG PO TABS: 500.0000 mg | ORAL_TABLET | Freq: Two times a day (BID) | ORAL | Status: DC

## 2014-05-04 NOTE — Addendum Note (Signed)
Addended by: Lucious Groves on: 05/04/2014 02:38 PM   Modules accepted: Orders

## 2014-05-07 NOTE — Addendum Note (Signed)
Addended by: Joni Reining C on: 05/07/2014 12:55 PM   Modules accepted: Orders

## 2014-05-16 ENCOUNTER — Ambulatory Visit: Payer: Medicaid Other | Admitting: Podiatry

## 2014-05-27 ENCOUNTER — Emergency Department (HOSPITAL_COMMUNITY)
Admission: EM | Admit: 2014-05-27 | Discharge: 2014-05-28 | Disposition: A | Discharge status: Home / Self Care | Payer: Medicaid Other | Attending: Emergency Medicine | Admitting: Emergency Medicine

## 2014-05-27 ENCOUNTER — Encounter (HOSPITAL_COMMUNITY): Payer: Self-pay | Admitting: Emergency Medicine

## 2014-05-27 DIAGNOSIS — E11319 Type 2 diabetes mellitus with unspecified diabetic retinopathy without macular edema: Secondary | ICD-10-CM | POA: Insufficient documentation

## 2014-05-27 DIAGNOSIS — Z862 Personal history of diseases of the blood and blood-forming organs and certain disorders involving the immune mechanism: Secondary | ICD-10-CM | POA: Diagnosis not present

## 2014-05-27 DIAGNOSIS — Z79899 Other long term (current) drug therapy: Secondary | ICD-10-CM | POA: Diagnosis not present

## 2014-05-27 DIAGNOSIS — Z720 Tobacco use: Secondary | ICD-10-CM | POA: Diagnosis not present

## 2014-05-27 DIAGNOSIS — E1140 Type 2 diabetes mellitus with diabetic neuropathy, unspecified: Secondary | ICD-10-CM | POA: Insufficient documentation

## 2014-05-27 DIAGNOSIS — Z8739 Personal history of other diseases of the musculoskeletal system and connective tissue: Secondary | ICD-10-CM | POA: Insufficient documentation

## 2014-05-27 DIAGNOSIS — R1012 Left upper quadrant pain: Secondary | ICD-10-CM | POA: Diagnosis present

## 2014-05-27 DIAGNOSIS — K5900 Constipation, unspecified: Secondary | ICD-10-CM | POA: Insufficient documentation

## 2014-05-27 DIAGNOSIS — K863 Pseudocyst of pancreas: Secondary | ICD-10-CM | POA: Insufficient documentation

## 2014-05-27 DIAGNOSIS — N189 Chronic kidney disease, unspecified: Secondary | ICD-10-CM | POA: Diagnosis not present

## 2014-05-27 DIAGNOSIS — K859 Acute pancreatitis, unspecified: Secondary | ICD-10-CM | POA: Diagnosis not present

## 2014-05-27 DIAGNOSIS — I129 Hypertensive chronic kidney disease with stage 1 through stage 4 chronic kidney disease, or unspecified chronic kidney disease: Secondary | ICD-10-CM | POA: Insufficient documentation

## 2014-05-27 DIAGNOSIS — E785 Hyperlipidemia, unspecified: Secondary | ICD-10-CM | POA: Diagnosis not present

## 2014-05-27 LAB — CBC WITH DIFFERENTIAL/PLATELET
BASOS ABS: 0 10*3/uL (ref 0.0–0.1)
BASOS PCT: 1 % (ref 0–1)
Eosinophils Absolute: 0.1 10*3/uL (ref 0.0–0.7)
Eosinophils Relative: 3 % (ref 0–5)
HCT: 30.3 % — ABNORMAL LOW (ref 36.0–46.0)
HEMOGLOBIN: 10.2 g/dL — AB (ref 12.0–15.0)
Lymphocytes Relative: 42 % (ref 12–46)
Lymphs Abs: 1.8 10*3/uL (ref 0.7–4.0)
MCH: 29.8 pg (ref 26.0–34.0)
MCHC: 33.7 g/dL (ref 30.0–36.0)
MCV: 88.6 fL (ref 78.0–100.0)
MONOS PCT: 6 % (ref 3–12)
Monocytes Absolute: 0.3 10*3/uL (ref 0.1–1.0)
Neutro Abs: 2.1 10*3/uL (ref 1.7–7.7)
Neutrophils Relative %: 49 % (ref 43–77)
Platelets: 256 10*3/uL (ref 150–400)
RBC: 3.42 MIL/uL — AB (ref 3.87–5.11)
RDW: 13.4 % (ref 11.5–15.5)
WBC: 4.2 10*3/uL (ref 4.0–10.5)

## 2014-05-27 LAB — COMPREHENSIVE METABOLIC PANEL
ALT: 10 U/L (ref 0–35)
AST: 13 U/L (ref 0–37)
Albumin: 3.1 g/dL — ABNORMAL LOW (ref 3.5–5.2)
Alkaline Phosphatase: 67 U/L (ref 39–117)
Anion gap: 12 (ref 5–15)
BUN: 23 mg/dL (ref 6–23)
CO2: 18 mmol/L — AB (ref 19–32)
CREATININE: 1.66 mg/dL — AB (ref 0.50–1.10)
Calcium: 8.8 mg/dL (ref 8.4–10.5)
Chloride: 103 mmol/L (ref 96–112)
GFR calc Af Amer: 37 mL/min — ABNORMAL LOW (ref >90)
GFR calc non Af Amer: 32 mL/min — ABNORMAL LOW (ref >90)
GLUCOSE: 189 mg/dL — AB (ref 70–99)
Potassium: 4.4 mmol/L (ref 3.5–5.1)
SODIUM: 133 mmol/L — AB (ref 135–145)
Total Bilirubin: 0.3 mg/dL (ref 0.3–1.2)
Total Protein: 7 g/dL (ref 6.0–8.3)

## 2014-05-27 LAB — LIPASE, BLOOD: LIPASE: 16 U/L (ref 11–59)

## 2014-05-27 MED ORDER — ONDANSETRON HCL 4 MG/2ML IJ SOLN
4.0000 mg | Freq: Once | INTRAMUSCULAR | Status: AC
  Administered 2014-05-28: 4 mg via INTRAVENOUS
  Filled 2014-05-27 (×2): qty 2

## 2014-05-27 MED ORDER — SODIUM CHLORIDE 0.9 % IV BOLUS (SEPSIS)
500.0000 mL | Freq: Once | INTRAVENOUS | Status: AC
  Administered 2014-05-28: 500 mL via INTRAVENOUS

## 2014-05-27 MED ORDER — MORPHINE SULFATE 4 MG/ML IJ SOLN
4.0000 mg | Freq: Once | INTRAMUSCULAR | Status: AC
  Administered 2014-05-28: 4 mg via INTRAVENOUS
  Filled 2014-05-27 (×2): qty 1

## 2014-05-27 NOTE — ED Notes (Signed)
Pt c/o pancreatitis-- hx of same-- started last week-- "went away, then started back up this am"  States had a drink with family party-- started the pain, stopped drinking in 2007.

## 2014-05-27 NOTE — ED Notes (Signed)
Attempted IV insertion x 1, unsuccessful. Pt refusing to be stuck by RN again. Will have another RN assess.

## 2014-05-27 NOTE — ED Provider Notes (Signed)
CSN: [id number]     Arrival date & time 05/27/14  2011 History   First MD Initiated Contact with Patient 05/27/14 2319     This chart was scribed for Veryl Speak, MD by Forrestine Him, ED Scribe. This patient was seen in room B17C/B17C and the patient's care was started 11:21 PM.   Chief Complaint  Patient presents with  . Pancreatitis  . Abdominal Pain   Patient is a 64 y.o. female presenting with abdominal pain. The history is provided by the patient. No language interpreter was used.  Abdominal Pain Pain location:  LUQ, LLQ, RLQ and RUQ Pain radiates to:  Does not radiate Pain severity:  Moderate Onset quality:  Gradual Duration:  2 weeks Timing:  Constant Progression:  Unchanged Chronicity:  Recurrent Context: alcohol use   Relieved by:  None tried Worsened by:  Nothing tried Ineffective treatments:  None tried Associated symptoms: constipation   Associated symptoms: no chills, no diarrhea, no fever, no nausea and no vomiting   Risk factors: has not had multiple surgeries     HPI Comments: [name] is a 64 y.o. female with a PMHx of DM, HTN, peripheral neuropathy, pancreatitis, renal insufficiency who presents to the Emergency Department complaining of constant, ongoing abdominal pain x 1.5 weeks. She also reports constipation which she states is baseline due to her medications. No recent fever, chills, nausea, vomiting, or diarrhea. [name] admits to having some liquor at a party followed by some beer 2 days ago. PSHx includes abdomen surgery to correct gallstones approximately 5 years ago. Next colonoscopy due in 2017. No known allergies to medications.  Past Medical History  Diagnosis Date  . Diabetes mellitus   . Hypertension   . ANEMIA, CHRONIC DISEASE NEC 03/15/2006  . DEGENERATIVE JOINT DISEASE 05/06/2007    On going for >5 yrs No imaging to confirm Has tried ultram, mobic, neurontin which did not work Ambulance person tried once worked well    . DIABETIC PERIPHERAL  NEUROPATHY 03/15/2006  . DIABETIC  RETINOPATHY 03/15/2006  . Hyperlipidemia 04/01/2011  . PANCREATITIS, CHRONIC 03/29/2006  . RENAL INSUFFICIENCY, CHRONIC 03/15/2006  . Peripheral arterial disease    Past Surgical History  Procedure Laterality Date  . Breast surgery    . Mouth surgery N/A    No family history on file. History  Substance Use Topics  . Smoking status: Current Some Day Smoker -- 0.10 packs/day    Types: Cigarettes  . Smokeless tobacco: Not on file  . Alcohol Use: 0.0 oz/week    0 Standard drinks or equivalent per week     Comment: daily   OB History    No data available     Review of Systems  Constitutional: Negative for fever and chills.  Gastrointestinal: Positive for abdominal pain and constipation. Negative for nausea, vomiting and diarrhea.  All other systems reviewed and are negative.     Allergies  Review of patient's allergies indicates no known allergies.  Home Medications   Prior to Admission medications   Medication Sig Start Date End Date Taking? Authorizing Provider  albuterol (PROVENTIL HFA) 108 (90 BASE) MCG/ACT inhaler Inhale 2 puffs into the lungs every 6 (six) hours as needed for wheezing. 12/22/13 12/22/14  Lucious Groves, DO  alendronate (FOSAMAX) 70 MG tablet Take 1 tablet (70 mg total) by mouth every 7 (seven) days. Take with a full glass of water on an empty stomach. Takes on Tues. 02/26/14   Lucious Groves, DO  amitriptyline (  ELAVIL) 25 MG tablet Take 1 tablet (25 mg total) by mouth at bedtime. 05/03/14   Lucious Groves, DO  Calcium Carbonate-Vitamin D (CALCIUM-VITAMIN D) 500-200 MG-UNIT per tablet Take 1 tablet by mouth 2 (two) times daily. 04/10/13 04/10/14  Charlann Lange, MD  gabapentin (NEURONTIN) 300 MG capsule Take 1 capsule (300 mg total) by mouth 3 (three) times daily. 03/29/14 03/29/15  Lucious Groves, DO  glipiZIDE (GLUCOTROL) 5 MG tablet Take 1 tablet (5 mg total) by mouth 2 (two) times daily before a meal. 03/23/14 03/23/15  Lucious Groves, DO   glucose blood (ACCU-CHEK AVIVA) test strip Use as instructed 03/23/14   Lucious Groves, DO  guaiFENesin-dextromethorphan (ROBITUSSIN DM) 100-10 MG/5ML syrup Take 5 mLs by mouth every 4 (four) hours as needed for cough. 01/15/14   Milagros Loll, MD  ipratropium (ATROVENT HFA) 17 MCG/ACT inhaler Inhale 1-2 puffs into the lungs every 6 (six) hours as needed for wheezing. 01/15/14   Milagros Loll, MD  losartan (COZAAR) 25 MG tablet Take 1 tablet (25 mg total) by mouth once. 03/29/14   Lucious Groves, DO  metFORMIN (GLUCOPHAGE) 500 MG tablet Take 1 tablet (500 mg total) by mouth 2 (two) times daily with a meal. 05/04/14 05/04/15  Lucious Groves, DO  Pancrelipase, Lip-Prot-Amyl, 24000 UNITS CPEP Take 2-3 capsules (48,000-72,000 Units total) by mouth 3 (three) times daily. With meals and snacks. No more than 10/24hours 07/26/13   Lucious Groves, DO  polyethylene glycol (MIRALAX / GLYCOLAX) packet Take 17 g by mouth daily. 12/11/13   Wilber Oliphant, MD  promethazine (PHENERGAN) 12.5 MG tablet Take 1 tablet (12.5 mg total) by mouth every 6 (six) hours as needed for nausea or vomiting. 03/29/14   Lucious Groves, DO  sitaGLIPtin (JANUVIA) 50 MG tablet Take 1 tablet (50 mg total) by mouth daily. 03/30/14   Lucious Groves, DO  traMADol (ULTRAM) 50 MG tablet Take 1 tablet (50 mg total) by mouth every 8 (eight) hours as needed. 12/21/13 12/21/14  Lucious Groves, DO  travoprost, benzalkonium, (TRAVATAN) 0.004 % ophthalmic solution Place 1 drop into both eyes at bedtime.    Historical Provider, MD   Triage Vitals: BP 121/68 mmHg  Pulse 77  Temp(Src) 98.3 F (36.8 C) (Oral)  Resp 15  Ht 5\' 5"  (1.651 m)  Wt 160 lb (72.576 kg)  BMI 26.63 kg/m2  SpO2 98%   Physical Exam  Constitutional: She is oriented to person, place, and time. She appears well-developed and well-nourished. No distress.  HENT:  Head: Normocephalic and atraumatic.  Eyes: EOM are normal.  Neck: Normal range of motion.  Cardiovascular: Normal  rate, regular rhythm and normal heart sounds.   Pulmonary/Chest: Effort normal and breath sounds normal.  Abdominal: Soft. She exhibits no distension. There is tenderness. There is no rebound and no guarding.  Tenderness to palpation in epigastrium and suprapubic region.   Musculoskeletal: Normal range of motion.  Neurological: She is alert and oriented to person, place, and time.  Skin: Skin is warm and dry.  Psychiatric: She has a normal mood and affect. Judgment normal.  Nursing note and vitals reviewed.   ED Course  Procedures (including critical care time)  DIAGNOSTIC STUDIES: Oxygen Saturation is 98% on RA, Normal by my interpretation.    COORDINATION OF CARE: 11:21 PM-Discussed treatment plan with pt at bedside and pt agreed to plan.     Labs Review Labs Reviewed  CBC WITH DIFFERENTIAL/PLATELET - Abnormal;  Notable for the following:    RBC 3.42 (*)    Hemoglobin 10.2 (*)    HCT 30.3 (*)    All other components within normal limits  COMPREHENSIVE METABOLIC PANEL - Abnormal; Notable for the following:    Sodium 133 (*)    CO2 18 (*)    Glucose, Bld 189 (*)    Creatinine, Ser 1.66 (*)    Albumin 3.1 (*)    GFR calc non Af Amer 32 (*)    GFR calc Af Amer 37 (*)    All other components within normal limits  LIPASE, BLOOD    Imaging Review No results found.   EKG Interpretation None      MDM   Final diagnoses:  None    Patient is a 64 year old female with history of chronic pancreatitis. She presents for evaluation of epigastric and lower abdominal discomfort that has been going on for the past week. It became worse today. Her workup reveals no fever, no elevation of white count, however CT does show a small pseudocyst. I've discussed this finding with Dr. Grandville Silos from general surgery who feels as though this is best followed up by GI on an outpatient basis. She was given IV fluids, pain medications and is now feeling better. She will be discharged with pain  medicine, clear liquid diet, and follow-up with gastroenterology.  I personally performed the services described in this documentation, which was scribed in my presence. The recorded information has been reviewed and is accurate.      Veryl Speak, MD 05/28/14 [phone]

## 2014-05-28 ENCOUNTER — Encounter (HOSPITAL_COMMUNITY): Payer: Self-pay | Admitting: Radiology

## 2014-05-28 ENCOUNTER — Emergency Department (HOSPITAL_COMMUNITY): Payer: Medicaid Other

## 2014-05-28 MED ORDER — HYDROCODONE-ACETAMINOPHEN 5-325 MG PO TABS: 1.0000 | ORAL_TABLET | Freq: Four times a day (QID) | ORAL | Status: DC | PRN

## 2014-05-28 MED ORDER — IOHEXOL 300 MG/ML  SOLN
25.0000 mL | Freq: Once | INTRAMUSCULAR | Status: AC | PRN
  Administered 2014-05-28: 25 mL via ORAL

## 2014-05-28 MED ORDER — IOHEXOL 300 MG/ML  SOLN
80.0000 mL | Freq: Once | INTRAMUSCULAR | Status: AC | PRN
  Administered 2014-05-28: 80 mL via INTRAVENOUS

## 2014-05-28 NOTE — ED Notes (Signed)
Ellison Hughs, RN unable to initiate IV as well.

## 2014-05-28 NOTE — Discharge Instructions (Signed)
Clear liquid diet for the next 2 days, then slowly advance to normal.  Hydrocodone as prescribed as needed for pain.  Follow-up with your gastroenterologist in the next week. Dr. Blanch Media contact information has been provided in this discharge summary. Call to arrange this appointment.   Acute Pancreatitis Acute pancreatitis is a disease in which the pancreas becomes suddenly inflamed. The pancreas is a large gland located behind your stomach. The pancreas produces enzymes that help digest food. The pancreas also releases the hormones glucagon and insulin that help regulate blood sugar. Damage to the pancreas occurs when the digestive enzymes from the pancreas are activated and begin attacking the pancreas before being released into the intestine. Most acute attacks last a couple of days and can cause serious complications. Some people become dehydrated and develop low blood pressure. In severe cases, bleeding into the pancreas can lead to shock and can be life-threatening. The lungs, heart, and kidneys may fail. CAUSES  Pancreatitis can happen to anyone. In some cases, the cause is unknown. Most cases are caused by:  Alcohol abuse.  Gallstones. Other less common causes are:  Certain medicines.  Exposure to certain chemicals.  Infection.  Damage caused by an accident (trauma).  Abdominal surgery. SYMPTOMS   Pain in the upper abdomen that may radiate to the back.  Tenderness and swelling of the abdomen.  Nausea and vomiting. DIAGNOSIS  Your caregiver will perform a physical exam. Blood and stool tests may be done to confirm the diagnosis. Imaging tests may also be done, such as X-rays, CT scans, or an ultrasound of the abdomen. TREATMENT  Treatment usually requires a stay in the hospital. Treatment may include:  Pain medicine.  Fluid replacement through an intravenous line (IV).  Placing a tube in the stomach to remove stomach contents and control vomiting.  Not eating for 3  or 4 days. This gives your pancreas a rest, because enzymes are not being produced that can cause further damage.  Antibiotic medicines if your condition is caused by an infection.  Surgery of the pancreas or gallbladder. HOME CARE INSTRUCTIONS   Follow the diet advised by your caregiver. This may involve avoiding alcohol and decreasing the amount of fat in your diet.  Eat smaller, more frequent meals. This reduces the amount of digestive juices the pancreas produces.  Drink enough fluids to keep your urine clear or pale yellow.  Only take over-the-counter or prescription medicines as directed by your caregiver.  Avoid drinking alcohol if it caused your condition.  Do not smoke.  Get plenty of rest.  Check your blood sugar at home as directed by your caregiver.  Keep all follow-up appointments as directed by your caregiver. SEEK MEDICAL CARE IF:   You do not recover as quickly as expected.  You develop new or worsening symptoms.  You have persistent pain, weakness, or nausea.  You recover and then have another episode of pain. SEEK IMMEDIATE MEDICAL CARE IF:   You are unable to eat or keep fluids down.  Your pain becomes severe.  You have a fever or persistent symptoms for more than 2 to 3 days.  You have a fever and your symptoms suddenly get worse.  Your skin or the white part of your eyes turn yellow (jaundice).  You develop vomiting.  You feel dizzy, or you faint.  Your blood sugar is high (over 300 mg/dL). MAKE SURE YOU:   Understand these instructions.  Will watch your condition.  Will get help right away  if you are not doing well or get worse. Document Released: 02/09/2005 Document Revised: 08/11/2011 Document Reviewed: 05/21/2011 [facility] Patient Information 2015 [place], [place]. This information is not intended to replace advice given to you by your health care provider. Make sure you discuss any questions you have with your health care provider.

## 2014-05-30 ENCOUNTER — Other Ambulatory Visit: Payer: Self-pay | Admitting: *Deleted

## 2014-05-30 ENCOUNTER — Encounter: Payer: Self-pay | Admitting: Podiatry

## 2014-05-30 ENCOUNTER — Ambulatory Visit (INDEPENDENT_AMBULATORY_CARE_PROVIDER_SITE_OTHER): Payer: Medicaid Other | Admitting: Podiatry

## 2014-05-30 VITALS — BP 96/54 | HR 83 | Temp 98.3°F

## 2014-05-30 DIAGNOSIS — L97501 Non-pressure chronic ulcer of other part of unspecified foot limited to breakdown of skin: Secondary | ICD-10-CM | POA: Diagnosis not present

## 2014-05-30 DIAGNOSIS — E1142 Type 2 diabetes mellitus with diabetic polyneuropathy: Secondary | ICD-10-CM | POA: Diagnosis not present

## 2014-05-30 DIAGNOSIS — I739 Peripheral vascular disease, unspecified: Secondary | ICD-10-CM | POA: Diagnosis not present

## 2014-05-30 DIAGNOSIS — L84 Corns and callosities: Secondary | ICD-10-CM

## 2014-05-30 NOTE — Patient Instructions (Signed)
Apply Silvadene cream daily to the skin ulcer on the bottom of the right foot at the base of the fifth right toe daily and cover with gauze until drainage ends  Diabetes and Foot Care Diabetes may cause you to have problems because of poor blood supply (circulation) to your feet and legs. This may cause the skin on your feet to become thinner, break easier, and heal more slowly. Your skin may become dry, and the skin may peel and crack. You may also have nerve damage in your legs and feet causing decreased feeling in them. You may not notice minor injuries to your feet that could lead to infections or more serious problems. Taking care of your feet is one of the most important things you can do for yourself.  HOME CARE INSTRUCTIONS  Wear shoes at all times, even in the house. Do not go barefoot. Bare feet are easily injured.  Check your feet daily for blisters, cuts, and redness. If you cannot see the bottom of your feet, use a mirror or ask someone for help.  Wash your feet with warm water (do not use hot water) and mild soap. Then pat your feet and the areas between your toes until they are completely dry. Do not soak your feet as this can dry your skin.  Apply a moisturizing lotion or petroleum jelly (that does not contain alcohol and is unscented) to the skin on your feet and to dry, brittle toenails. Do not apply lotion between your toes.  Trim your toenails straight across. Do not dig under them or around the cuticle. File the edges of your nails with an emery board or nail file.  Do not cut corns or calluses or try to remove them with medicine.  Wear clean socks or stockings every day. Make sure they are not too tight. Do not wear knee-high stockings since they may decrease blood flow to your legs.  Wear shoes that fit properly and have enough cushioning. To break in new shoes, wear them for just a few hours a day. This prevents you from injuring your feet. Always look in your shoes  before you put them on to be sure there are no objects inside.  Do not cross your legs. This may decrease the blood flow to your feet.  If you find a minor scrape, cut, or break in the skin on your feet, keep it and the skin around it clean and dry. These areas may be cleansed with mild soap and water. Do not cleanse the area with peroxide, alcohol, or iodine.  When you remove an adhesive bandage, be sure not to damage the skin around it.  If you have a wound, look at it several times a day to make sure it is healing.  Do not use heating pads or hot water bottles. They may burn your skin. If you have lost feeling in your feet or legs, you may not know it is happening until it is too late.  Make sure your health care provider performs a complete foot exam at least annually or more often if you have foot problems. Report any cuts, sores, or bruises to your health care provider immediately. SEEK MEDICAL CARE IF:   You have an injury that is not healing.  You have cuts or breaks in the skin.  You have an ingrown nail.  You notice redness on your legs or feet.  You feel burning or tingling in your legs or feet.  You have  pain or cramps in your legs and feet.  Your legs or feet are numb.  Your feet always feel cold. SEEK IMMEDIATE MEDICAL CARE IF:   There is increasing redness, swelling, or pain in or around a wound.  There is a red line that goes up your leg.  Pus is coming from a wound.  You develop a fever or as directed by your health care provider.  You notice a bad smell coming from an ulcer or wound. Document Released: 02/07/2000 Document Revised: 10/12/2012 Document Reviewed: 07/19/2012 [facility] Patient Information 2015 [place], [place]. This information is not intended to replace advice given to you by your health care provider. Make sure you discuss any questions you have with your health care provider.

## 2014-05-31 NOTE — Progress Notes (Signed)
Patient ID: [name], female   DOB: 1950-08-20, 64 y.o.   MRN: [id number] Subjective: This patient presents for ongoing evaluation and treatment for pre-ulcerative in ulcerative skin lesions on the right and left feet. She presents at approximately three-month intervals. She is complaining of ongoing neuropathy which is been treated by her primary care physician and currently under evaluation for stomach pain  Objective: Orientated 3  Right foot Plantar fifth right MPJ has hemorrhagic hyperkeratotic tissue that breaks down into a superficial ulcer measuring 3 x 3 mm with a granular base Plantar first MPJ has hemorrhagic tissue without an open wound  Left foot: Plantar first and fifth MPJ have hemorrhagic tissue remain closed after debridement Nucleated lesion plantar left heel  Assessment: History of peripheral arterial disease History of diabetic peripheral neuropathy Superficial ulceration fifth right MPJ Pre-ulcerative callus plantar right first MPJ, left first MPJ, fifth left MPJ Nucleated keratoses left heel  Plan: Debridement of pre-ulcerative callus and ulcer Silvadene dressing the fifth right MPJ and patient will continue daily application until closed Continue wear diabetic shoes  Reappoint 3 weeks

## 2014-06-01 MED ORDER — PROMETHAZINE HCL 12.5 MG PO TABS: 12.5000 mg | ORAL_TABLET | Freq: Four times a day (QID) | ORAL | Status: DC | PRN

## 2014-06-05 ENCOUNTER — Other Ambulatory Visit: Payer: Self-pay | Admitting: *Deleted

## 2014-06-05 ENCOUNTER — Ambulatory Visit (INDEPENDENT_AMBULATORY_CARE_PROVIDER_SITE_OTHER): Payer: Medicaid Other | Admitting: Internal Medicine

## 2014-06-05 ENCOUNTER — Encounter: Payer: Self-pay | Admitting: Internal Medicine

## 2014-06-05 VITALS — BP 114/57 | HR 87 | Temp 98.0°F | Ht 65.0 in | Wt 150.8 lb

## 2014-06-05 DIAGNOSIS — T402X5A Adverse effect of other opioids, initial encounter: Secondary | ICD-10-CM

## 2014-06-05 DIAGNOSIS — K861 Other chronic pancreatitis: Secondary | ICD-10-CM

## 2014-06-05 DIAGNOSIS — K863 Pseudocyst of pancreas: Secondary | ICD-10-CM

## 2014-06-05 DIAGNOSIS — Z0000 Encounter for general adult medical examination without abnormal findings: Secondary | ICD-10-CM

## 2014-06-05 DIAGNOSIS — E11319 Type 2 diabetes mellitus with unspecified diabetic retinopathy without macular edema: Secondary | ICD-10-CM

## 2014-06-05 DIAGNOSIS — K5903 Drug induced constipation: Secondary | ICD-10-CM

## 2014-06-05 DIAGNOSIS — K5900 Constipation, unspecified: Secondary | ICD-10-CM

## 2014-06-05 HISTORY — DX: Drug induced constipation: K59.03

## 2014-06-05 HISTORY — DX: Adverse effect of other opioids, initial encounter: T40.2X5A

## 2014-06-05 NOTE — Progress Notes (Signed)
[facility] Subjective:   Patient ID: [name] female   DOB: Jan 04, 1951 64 y.o.   MRN: [id number]  HPI: [name] is a 64 y.o. female with a PMH detailed below who presents for ED follow up.  She was seen in the ED on 05/27/14 for abdominal pain and diagnosed with acute pancreatitsi and pancreatic pseudocyst.  She was apparently evalauted by gen surgery and then discharged home to follow up with Dr. Henrene Pastor (GI), she tried to call his office for an appointment but apparently could not get one presumably that she needs to be referred. For her abdominal pain she reports after she left the ED if was feeling better, occasionally she has severe pain, yesterday was one of those days where she was having severe pain so she made an appointment with me.  She notes that last night she had a bowel movement and the pain resolved completely.  She points to her lower abdomen/suprapubic pain as the location of her pain.  She denies any radiation.  She currently reports pain has resolved.  Past Medical History  Diagnosis Date  . Diabetes mellitus   . Hypertension   . ANEMIA, CHRONIC DISEASE NEC 03/15/2006  . DEGENERATIVE JOINT DISEASE 05/06/2007    On going for >5 yrs No imaging to confirm Has tried ultram, mobic, neurontin which did not work Ambulance person tried once worked well    . 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 DIABETIC  RETINOPATHY 03/15/2006  . Hyperlipidemia 04/01/2011  . PANCREATITIS, CHRONIC 03/29/2006  . RENAL INSUFFICIENCY, CHRONIC 03/15/2006  . Peripheral arterial disease    Current Outpatient Prescriptions  Medication Sig Dispense Refill  . albuterol (PROVENTIL HFA) 108 (90 BASE) MCG/ACT inhaler Inhale 2 puffs into the lungs every 6 (six) hours as needed for wheezing. 6.7 g 6  . alendronate (FOSAMAX) 70 MG tablet Take 1 tablet (70 mg total) by mouth every 7 (seven) days. Take with a full glass of water on an empty stomach. Takes on Tues. 4 tablet 5  .  amitriptyline (ELAVIL) 25 MG tablet Take 1 tablet (25 mg total) by mouth at bedtime. 90 tablet 0  . Calcium Carbonate-Vitamin D (CALCIUM-VITAMIN D) 500-200 MG-UNIT per tablet Take 1 tablet by mouth 2 (two) times daily. (Patient taking differently: Take 1 tablet by mouth daily. ) 60 tablet 6  . gabapentin (NEURONTIN) 300 MG capsule Take 1 capsule (300 mg total) by mouth 3 (three) times daily. 90 capsule 2  . glipiZIDE (GLUCOTROL) 5 MG tablet Take 1 tablet (5 mg total) by mouth 2 (two) times daily before a meal. 180 tablet 3  . glucose blood (ACCU-CHEK AVIVA) test strip Use as instructed 100 each 12  . guaiFENesin-dextromethorphan (ROBITUSSIN DM) 100-10 MG/5ML syrup Take 5 mLs by mouth every 4 (four) hours as needed for cough. 236 mL 0  . HYDROcodone-acetaminophen (NORCO) 5-325 MG per tablet Take 1-2 tablets by mouth every 6 (six) hours as needed. 20 tablet 0  . ipratropium (ATROVENT HFA) 17 MCG/ACT inhaler Inhale 1-2 puffs into the lungs every 6 (six) hours as needed for wheezing. 1 Inhaler 2  . losartan (COZAAR) 25 MG tablet Take 1 tablet (25 mg total) by mouth once. (Patient taking differently: Take 25 mg by mouth daily. ) 90 tablet 3  . metFORMIN (GLUCOPHAGE) 500 MG tablet Take 1 tablet (500 mg total) by mouth 2 (two) times daily with a meal. 60 tablet 3  . Pancrelipase, Lip-Prot-Amyl, 24000 UNITS CPEP Take 2-3 capsules (  48,000-72,000 Units total) by mouth 3 (three) times daily. With meals and snacks. No more than 10/24hours (Patient taking differently: Take 2-3 capsules by mouth 3 (three) times daily. Take 3 tablets With meals and 2 tablets with snacks. No more than 10/24hours) 180 capsule 11  . polyethylene glycol (MIRALAX / GLYCOLAX) packet Take 17 g by mouth daily. (Patient taking differently: Take 17 g by mouth daily as needed for mild constipation. ) 14 each 2  . promethazine (PHENERGAN) 12.5 MG tablet Take 1 tablet (12.5 mg total) by mouth every 6 (six) hours as needed for nausea or vomiting. 60  tablet 2  . sitaGLIPtin (JANUVIA) 50 MG tablet Take 1 tablet (50 mg total) by mouth daily. 30 tablet 2  . traMADol (ULTRAM) 50 MG tablet Take 1 tablet (50 mg total) by mouth every 8 (eight) hours as needed. (Patient taking differently: Take 50 mg by mouth every 8 (eight) hours as needed for moderate pain. ) 60 tablet 2  . travoprost, benzalkonium, (TRAVATAN) 0.004 % ophthalmic solution Place 1 drop into both eyes at bedtime.     No current facility-administered medications for this visit.   No family history on file. History   Social History  . Marital Status: Single    Spouse Name: N/A  . Number of Children: N/A  . Years of Education: N/A   Social History Main Topics  . Smoking status: Current Every Day Smoker -- 0.50 packs/day    Types: Cigarettes  . Smokeless tobacco: Never Used  . Alcohol Use: No     Comment: daily  . Drug Use: No  . Sexual Activity: Not on file   Other Topics Concern  . None   Social History Narrative   Review of Systems: Review of Systems  Constitutional: Negative for fever and chills.  Eyes: Positive for blurred vision (chronic).  Respiratory: Negative for cough and shortness of breath.   Cardiovascular: Negative for chest pain.  Gastrointestinal: Positive for abdominal pain (yesterday) and constipation (resolved last night). Negative for heartburn, nausea and vomiting.  Genitourinary: Negative for dysuria.  Neurological: Negative for dizziness and headaches.     Objective:  Physical Exam: Filed Vitals:   06/05/14 0929  BP: 114/57  Pulse: 87  Temp: 98 F (36.7 C)  TempSrc: Oral  Height: 5\' 5"  (1.651 m)  Weight: 150 lb 12.8 oz (68.402 kg)  SpO2: 100%  Physical Exam  Constitutional: She is well-developed, well-nourished, and in no distress. No distress.  HENT:  Head: Normocephalic and atraumatic.  Eyes: Conjunctivae are normal.  Cardiovascular: Normal rate, regular rhythm, normal heart sounds and intact distal pulses.   No murmur  heard. Pulmonary/Chest: Effort normal and breath sounds normal. She has no wheezes.  Abdominal: Soft. Bowel sounds are normal. She exhibits no distension. There is no tenderness.  Musculoskeletal: She exhibits no edema.  Skin: Skin is warm and dry. She is not diaphoretic.  Psychiatric: Affect and judgment normal.  Nursing note and vitals reviewed.   Physical Exam  Constitutional: She is well-developed, well-nourished, and in no distress. No distress.  HENT:  Head: Normocephalic and atraumatic.  Eyes: Conjunctivae are normal.  Cardiovascular: Normal rate, regular rhythm, normal heart sounds and intact distal pulses.   No murmur heard. Pulmonary/Chest: Effort normal and breath sounds normal. She has no wheezes.  Abdominal: Soft. Bowel sounds are normal. She exhibits no distension. There is no tenderness.  Musculoskeletal: She exhibits no edema.  Skin: Skin is warm and dry. She is not diaphoretic.  Psychiatric: Affect and judgment normal.  Nursing note and vitals reviewed.   Wt Readings from Last 5 Encounters:  06/05/14 150 lb 12.8 oz (68.402 kg)  05/27/14 160 lb (72.576 kg)  05/03/14 160 lb 8 oz (72.802 kg)  03/29/14 159 lb 6.4 oz (72.303 kg)  01/15/14 172 lb 8 oz (78.245 kg)   Weight is documented to have dropped 10 lbs since ED visit. Assessment & Plan:  Case discussed with Dr. Daryll Drown  PANCREATITIS, CHRONIC - Patient has history of chronic pancreatitis, asymptomatic today but with noted pseudocyst on CT. She wants to follow up with Dr Henrene Pastor as the ED instructed. - Will place referral to GI.   Constipation I suspect some of her abdominal pain to be due to constipation likely as a side effect of some of her medications.  We discussed changing her stool softener to daily instead of every other day.   Diabetes mellitus type 2 with retinopathy Lab Results  Component Value Date   HGBA1C 7.3 03/29/2014   HGBA1C 7.4* 12/02/2013   HGBA1C 7.9 04/10/2013      Assessment: Diabetes control:  fair control Progress toward A1C goal:   unchanged Comments:   Plan: Medications:  Januvia 50mg  daily, Glipizide 5mg  BID, Metformin 500mg  BID Home glucose monitoring: Frequency:   Timing:   Instruction/counseling given: reminded to get eye exam and reminded to bring medications to each visit Educational resources provided: brochure, handout Self management tools provided:   Other plans: referral placed for opthalmology referral for eye exam.     Healthcare maintenance - With her weight loss we should have her up to date with her health maintance and cancer screening.  I will place order for mammogram.  I offered her a pap smear today but she reports she is not ready and wants to wait till next visit.     Medications Ordered No orders of the defined types were placed in this encounter.   Other Orders Orders Placed This Encounter  Procedures  . MM Digital Screening    Standing Status: Future     Number of Occurrences:      Standing Expiration Date: 08/05/2015    Order Specific Question:  Reason for Exam (SYMPTOM  OR DIAGNOSIS REQUIRED)    Answer:  health maintance    Order Specific Question:  Preferred imaging location?    Answer:  [facility]  . Ambulatory referral to Ophthalmology    Referral Priority:  Routine    Referral Type:  Consultation    Referral Reason:  Specialty Services Required    Requested Specialty:  Ophthalmology    Number of Visits Requested:  1  . Ambulatory referral to Gastroenterology    Referral Priority:  Routine    Referral Type:  Consultation    Referral Reason:  Specialty Services Required    Requested Specialty:  Gastroenterology    Number of Visits Requested:  1

## 2014-06-05 NOTE — Assessment & Plan Note (Signed)
I suspect some of her abdominal pain to be due to constipation likely as a side effect of some of her medications.  We discussed changing her stool softener to daily instead of every other day.

## 2014-06-05 NOTE — Assessment & Plan Note (Signed)
Lab Results  Component Value Date   HGBA1C 7.3 03/29/2014   HGBA1C 7.4* 12/02/2013   HGBA1C 7.9 04/10/2013     Assessment: Diabetes control:  fair control Progress toward A1C goal:   unchanged Comments:   Plan: Medications:  Januvia 50mg  daily, Glipizide 5mg  BID, Metformin 500mg  BID Home glucose monitoring: Frequency:   Timing:   Instruction/counseling given: reminded to get eye exam and reminded to bring medications to each visit Educational resources provided: brochure, handout Self management tools provided:   Other plans: referral placed for opthalmology referral for eye exam.

## 2014-06-05 NOTE — Assessment & Plan Note (Signed)
-   Patient has history of chronic pancreatitis, asymptomatic today but with noted pseudocyst on CT. She wants to follow up with Dr Henrene Pastor as the ED instructed. - Will place referral to GI.

## 2014-06-05 NOTE — Patient Instructions (Signed)
General Instructions:  Please see your eye doctor and Dr. Henrene Pastor.  I have ordered a mammogram for you.  At the next visit we will do a pap smear.  Please bring your medicines with you each time you come to clinic.  Medicines may include prescription medications, over-the-counter medications, herbal remedies, eye drops, vitamins, or other pills.   Progress Toward Treatment Goals:  Treatment Goal 05/03/2014  Hemoglobin A1C unable to assess  Blood pressure at goal  Stop smoking smoking less    Self Care Goals & Plans:  Self Care Goal 06/05/2014  Manage my medications take my medicines as prescribed; bring my medications to every visit; refill my medications on time; follow the sick day instructions if I am sick  Monitor my health keep track of my blood glucose; keep track of my blood pressure; keep track of my weight; check my feet daily  Eat healthy foods eat more vegetables; eat fruit for snacks and desserts; eat baked foods instead of fried foods; eat foods that are low in salt; eat smaller portions; drink diet soda or water instead of juice or soda  Be physically active find an activity I enjoy  Stop smoking -    Home Blood Glucose Monitoring 03/29/2014  Check my blood sugar once a day  When to check my blood sugar before breakfast     Care Management & Community Referrals:  Referral 03/29/2014  Referrals made for care management support none needed

## 2014-06-05 NOTE — Assessment & Plan Note (Signed)
-   With her weight loss we should have her up to date with her health maintance and cancer screening.  I will place order for mammogram.  I offered her a pap smear today but she reports she is not ready and wants to wait till next visit.

## 2014-06-06 MED ORDER — HYDROCODONE-ACETAMINOPHEN 5-325 MG PO TABS: 1.0000 | ORAL_TABLET | Freq: Four times a day (QID) | ORAL | Status: DC | PRN

## 2014-06-06 NOTE — Telephone Encounter (Signed)
Was in no pain at visit, needs to be seen for further refills.

## 2014-06-08 ENCOUNTER — Encounter: Payer: Self-pay | Admitting: Internal Medicine

## 2014-06-08 NOTE — Progress Notes (Signed)
Internal Medicine Clinic Attending  Case discussed with Dr. Hoffman soon after the resident saw the patient.  We reviewed the resident's history and exam and pertinent patient test results.  I agree with the assessment, diagnosis, and plan of care documented in the resident's note. 

## 2014-06-11 ENCOUNTER — Encounter: Payer: Self-pay | Admitting: *Deleted

## 2014-06-20 ENCOUNTER — Other Ambulatory Visit: Payer: Self-pay | Admitting: Internal Medicine

## 2014-06-20 ENCOUNTER — Ambulatory Visit: Payer: Medicaid Other | Admitting: Podiatry

## 2014-06-20 DIAGNOSIS — Z1231 Encounter for screening mammogram for malignant neoplasm of breast: Secondary | ICD-10-CM

## 2014-07-02 ENCOUNTER — Other Ambulatory Visit: Payer: Self-pay | Admitting: Pulmonary Disease

## 2014-07-02 ENCOUNTER — Encounter: Payer: Self-pay | Admitting: Podiatry

## 2014-07-02 ENCOUNTER — Ambulatory Visit (INDEPENDENT_AMBULATORY_CARE_PROVIDER_SITE_OTHER): Payer: Medicaid Other | Admitting: Podiatry

## 2014-07-02 VITALS — BP 125/64 | HR 96 | Temp 98.4°F | Resp 12

## 2014-07-02 DIAGNOSIS — Q669 Congenital deformity of feet, unspecified: Secondary | ICD-10-CM | POA: Diagnosis not present

## 2014-07-02 DIAGNOSIS — L97501 Non-pressure chronic ulcer of other part of unspecified foot limited to breakdown of skin: Secondary | ICD-10-CM

## 2014-07-02 DIAGNOSIS — E1140 Type 2 diabetes mellitus with diabetic neuropathy, unspecified: Secondary | ICD-10-CM

## 2014-07-02 DIAGNOSIS — L84 Corns and callosities: Secondary | ICD-10-CM

## 2014-07-02 DIAGNOSIS — E1142 Type 2 diabetes mellitus with diabetic polyneuropathy: Secondary | ICD-10-CM

## 2014-07-02 DIAGNOSIS — G629 Polyneuropathy, unspecified: Secondary | ICD-10-CM

## 2014-07-02 MED ORDER — SILVER SULFADIAZINE 1 % EX CREA: 1.0000 "application " | TOPICAL_CREAM | Freq: Every day | CUTANEOUS | Status: DC

## 2014-07-02 NOTE — Patient Instructions (Signed)
Apply Silvadene cream daily to skin ulcers on the bottom the right foot and cover with gauze

## 2014-07-03 NOTE — Progress Notes (Signed)
Patient ID: [name], female   DOB: 08/23/1950, 64 y.o.   MRN: [id number]  Subjective: This patient presents for ongoing evaluation and treatment of pre-ulcerative in ulcerative plantar skin lesions associated with diabetes. She presents at approximately 3 week intervals. She continues to complain of neuropathy which is under treatment by primary care physician and pending stomach pain with a pending evaluation by gastroenterologist  Objective: Right foot Plantar fifth right MPJ has superficial ulcer with a granular base 10 mm diameter surrounded by hyperkeratotic tissue  Plantar first MPJ has hyperkeratotic tissue with 1 mm granular base  Left foot Plantar keratoses sub-left first MPJ with bleeding within the callused Nucleated keratoses left heel  Assessment: Superficial ulcerations 2 right foot Pre-ulcerative callus plantar left Porokeratosis left heel  Plan: Debride all ulcers and keratotic lesions  Patient apply Silvadene to ulcers on right foot and cover with gauze She will ambulate in her diabetic shoes  Reappoint 3 weeks

## 2014-07-04 ENCOUNTER — Ambulatory Visit: Payer: Medicaid Other

## 2014-07-04 ENCOUNTER — Other Ambulatory Visit: Payer: Medicaid Other

## 2014-07-09 ENCOUNTER — Other Ambulatory Visit: Payer: Self-pay | Admitting: *Deleted

## 2014-07-10 MED ORDER — TRAMADOL HCL 50 MG PO TABS: 50.0000 mg | ORAL_TABLET | Freq: Three times a day (TID) | ORAL | Status: DC | PRN

## 2014-07-11 NOTE — Telephone Encounter (Signed)
Rx called in to pharmacy. 

## 2014-07-25 ENCOUNTER — Ambulatory Visit: Payer: Medicaid Other | Admitting: Podiatry

## 2014-07-31 ENCOUNTER — Telehealth: Payer: Self-pay | Admitting: Dietician

## 2014-07-31 ENCOUNTER — Other Ambulatory Visit: Payer: Self-pay | Admitting: *Deleted

## 2014-07-31 MED ORDER — PANCRELIPASE (LIP-PROT-AMYL) 24000-76000 UNITS PO CPEP: ORAL_CAPSULE | ORAL | Status: DC

## 2014-07-31 NOTE — Telephone Encounter (Signed)
Called patient to see if she made it to her eye exam in April. She sais she did not because tranportation did not pick her up. She called [facility] and they said we have to reschedule and that she cannot. Will notify referral coordinator.  She also requests a refill on her creon. Will notify triage nurse

## 2014-07-31 NOTE — Telephone Encounter (Signed)
Request on med sent to Dr Heber [place].

## 2014-07-31 NOTE — Telephone Encounter (Signed)
Pt aware.

## 2014-08-03 ENCOUNTER — Encounter: Payer: Self-pay | Admitting: Internal Medicine

## 2014-08-03 ENCOUNTER — Encounter (INDEPENDENT_AMBULATORY_CARE_PROVIDER_SITE_OTHER): Payer: Self-pay

## 2014-08-03 ENCOUNTER — Other Ambulatory Visit (INDEPENDENT_AMBULATORY_CARE_PROVIDER_SITE_OTHER): Payer: Medicaid Other

## 2014-08-03 ENCOUNTER — Ambulatory Visit (INDEPENDENT_AMBULATORY_CARE_PROVIDER_SITE_OTHER): Payer: Medicaid Other | Admitting: Internal Medicine

## 2014-08-03 VITALS — BP 110/60 | HR 72 | Ht 65.0 in | Wt 145.8 lb

## 2014-08-03 DIAGNOSIS — R932 Abnormal findings on diagnostic imaging of liver and biliary tract: Secondary | ICD-10-CM | POA: Diagnosis not present

## 2014-08-03 DIAGNOSIS — R109 Unspecified abdominal pain: Secondary | ICD-10-CM | POA: Diagnosis not present

## 2014-08-03 DIAGNOSIS — K5900 Constipation, unspecified: Secondary | ICD-10-CM | POA: Diagnosis not present

## 2014-08-03 DIAGNOSIS — K863 Pseudocyst of pancreas: Secondary | ICD-10-CM

## 2014-08-03 DIAGNOSIS — K862 Cyst of pancreas: Secondary | ICD-10-CM

## 2014-08-03 LAB — BASIC METABOLIC PANEL
BUN: 34 mg/dL — AB (ref 6–23)
CO2: 22 mEq/L (ref 19–32)
Calcium: 9.1 mg/dL (ref 8.4–10.5)
Chloride: 102 mEq/L (ref 96–112)
Creatinine, Ser: 2.38 mg/dL — ABNORMAL HIGH (ref 0.40–1.20)
GFR: 26.52 mL/min — ABNORMAL LOW (ref >60.00)
GLUCOSE: 203 mg/dL — AB (ref 70–99)
Potassium: 4.6 mEq/L (ref 3.5–5.1)
Sodium: 130 mEq/L — ABNORMAL LOW (ref 135–145)

## 2014-08-03 MED ORDER — NA SULFATE-K SULFATE-MG SULF 17.5-3.13-1.6 GM/177ML PO SOLN
1.0000 | Freq: Once | ORAL | Status: DC
Start: 2014-08-03 — End: 2014-11-05

## 2014-08-03 NOTE — Patient Instructions (Addendum)
  Your physician has requested that you go to the basement for the following lab work before leaving today:  BMET  You have been scheduled for a colonoscopy. Please follow written instructions given to you at your visit today.  Please pick up your prep supplies at the pharmacy within the next 1-3 days. If you use inhalers (even only as needed), please bring them with you on the day of your procedure.   You have been scheduled for a CT scan of the abdomen and pelvis at [facility] ([address]---this is in the same building as [facility]).   You are scheduled on 08/09/2014 at  1:30pm. You should arrive 15 minutes prior to your appointment time for registration. Please follow the written instructions below on the day of your exam:  WARNING: IF YOU ARE ALLERGIC TO IODINE/X-RAY DYE, PLEASE NOTIFY RADIOLOGY IMMEDIATELY AT [phone]! YOU WILL BE GIVEN A 13 HOUR PREMEDICATION PREP.  1) Do not eat or drink anything after __8:30am__ (4 hours prior to your test) 2) You have been given 2 bottles of oral contrast to drink. The solution may taste  better if refrigerated, but do NOT add ice or any other liquid to this solution. Shake  well before drinking.    Drink 1 bottle of contrast @ __11:30am__ (2 hours prior to your exam)  Drink 1 bottle of contrast @ __12:30pm___(1 hour prior to your exam)  You may take any medications as prescribed with a small amount of water except for the following: Metformin, Glucophage, Glucovance, Avandamet, Riomet, Fortamet, Actoplus Met, Janumet, Glumetza or Metaglip. The above medications must be held the day of the exam AND 48 hours after the exam.  The purpose of you drinking the oral contrast is to aid in the visualization of your intestinal tract. The contrast solution may cause some diarrhea. Before your exam is started, you will be given a small amount of fluid to drink. Depending on your individual set of symptoms, you may also receive an  intravenous injection of x-ray contrast/dye. Plan on being at [facility] for 30 minutes or long, depending on the type of exam you are having performed.  If you have any questions regarding your exam or if you need to reschedule, you may call the CT department at [phone] between the hours of 8:00 am and 5:00 pm, Monday-Friday.  Increase your Miralax as discussed.  ________________________________________________________________________

## 2014-08-03 NOTE — Progress Notes (Signed)
HISTORY OF PRESENT ILLNESS:  [name] is a 64 y.o. female with hypertension, diabetes mellitus, hyperlipidemia, and chronic pancreatitis. She presents today with a chief complaint of abdominal pain, weight loss, and constipation. She is referred to this clinic in consultation by her primary care physician Dr. Heber [place]. 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 Patient was last seen in this office April 2010 after being evaluated for chronic diarrhea secondary to pancreatic insufficiency. The problem was successfully treated with pancreatic enzyme supplementation. She has continued to use alcohol off and on and is not forthright at times. In any event, she was evaluated in the emergency room and April 2016 with a ten-day history of abdominal pain. Workup included laboratories and CT scan. CT scan demonstrated a pancreatic pseudocyst septation and surrounding edema consistent with acute on chronic pancreatitis. Biliary ductal dilation noted. Liver tests normal. Patient states that she has since had infrequent but significant lower abdominal discomfort. She does have chronic constipation for which she uses MiraLAX in her coffee every other day. She did note the defecation helped her lower abdominal pain. She underwent colonoscopy in July 2007 to evaluate constipation and hematochezia. The examination was complete to the cecum with good prep. However somewhat compromised by patient's inability to retain insufflated air. External hemorrhoids noted. Currently she denies rectal bleeding. She cannot identify any exacerbating or relieving factors for her pain. Constipation does seem to be helped with MiraLAX at times. Her weight in 2010 was 153 pounds. Her weight 2 months ago was 150 pounds. Her weight today is 145 pounds. She is status post cholecystectomy. Multiple outpatient notes from her primary provider, laboratories, an imaging studies personally  reviewed  REVIEW OF SYSTEMS:  All non-GI ROS negative except for arthritis, back pain, headaches, night sweats, ankle edema  Past Medical History  Diagnosis Date  . Diabetes mellitus   . Hypertension   . ANEMIA, CHRONIC DISEASE NEC 03/15/2006  . DEGENERATIVE JOINT DISEASE 05/06/2007    On going for >5 yrs No imaging to confirm Has tried ultram, mobic, neurontin which did not work Ambulance person tried once worked well    . 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 DIABETIC  RETINOPATHY 03/15/2006  . Hyperlipidemia 04/01/2011  . PANCREATITIS, CHRONIC 03/29/2006  . RENAL INSUFFICIENCY, CHRONIC 03/15/2006  . Peripheral arterial disease   . Hemorrhoids     Past Surgical History  Procedure Laterality Date  . Breast surgery    . Mouth surgery N/A     Social History [name]  reports that she has been smoking Cigarettes.  She has been smoking about 0.50 packs per day. She has never used smokeless tobacco. She reports that she does not drink alcohol or use illicit drugs.  family history is not on file.  No Known Allergies     PHYSICAL EXAMINATION: Vital signs: BP 110/60 mmHg  Pulse 72  Ht 5\' 5"  (1.651 m)  Wt 145 lb 12.8 oz (66.134 kg)  BMI 24.26 kg/m2  Constitutional: generally well-appearing, no acute distress Psychiatric: alert and oriented x3, cooperative Eyes: extraocular movements intact, anicteric, conjunctiva pink Mouth: oral pharynx moist, no lesions Neck: supple no lymphadenopathy Cardiovascular: heart regular rate and rhythm, no murmur Lungs: clear to auscultation bilaterally Abdomen: soft, nontender, nondistended, no obvious ascites, no peritoneal signs, normal bowel sounds, no organomegaly. Prior surgical incision well-healed Rectal: Deferred until colonoscopy Extremities: no lower extremity edema bilaterally Skin: no lesions on visible extremities Neuro: No focal deficits. No  asterixis.   ASSESSMENT:  #1. Lower abdominal pain. Likely related to  constipation #2. Chronic calcific pancreatitis secondary to alcohol. She continues to use intermittently #3. Abnormal CT scan of the abdomen with pancreatic is together malate as described #4. Mild weight loss. Question etiology #5. Previous colonoscopy 2007 #6. Multiple medical problems including diabetes   PLAN:  #1. Avoid alcohol. Strictly advised #2. Increase MiraLAX to achieve desired effect on bowel habits #3. Schedule contrast-enhanced CT scan of the abdomen and pelvis to follow-up cystic abnormality of the pancreas #4. Schedule colonoscopy to evaluate lower abdominal pain and provide follow-up colorectal neoplasia screening.The nature of the procedure, as well as the risks, benefits, and alternatives were carefully and thoroughly reviewed with the patient. Ample time for discussion and questions allowed. The patient understood, was satisfied, and agreed to proceed. We need to hold diabetic medications the day of the procedure to avoid and wanted hypoglycemia. Advised and instructed #5. Follow-up to be determined after the above #6. ongoing general care with Dr. Heber [place]  A copy of this consultation note has been sent to Dr. Heber [place]

## 2014-08-06 ENCOUNTER — Telehealth: Payer: Self-pay | Admitting: *Deleted

## 2014-08-06 DIAGNOSIS — N183 Chronic kidney disease, stage 3 unspecified: Secondary | ICD-10-CM

## 2014-08-06 DIAGNOSIS — E1122 Type 2 diabetes mellitus with diabetic chronic kidney disease: Secondary | ICD-10-CM

## 2014-08-06 NOTE — Telephone Encounter (Signed)
Pt states she saw Dr. Henrene Pastor on Friday.  She had labs drawn and by the time she got home she was called and asked to get in touch with Dr Heber Privateer.  Pt states she is taking metformin (500 mg bid) and in past it was stopped because of kidney function.  She was put on januvia.  Then the Januvia was  stopped and changed back to metformin. Her diet has not changed and she is eating and drinking water all day. She is not taking any NSAIDS I asked about coming into clinic this week and she can not.  Transportation needs several days to schedule.  She has CT scans on Thursday at 2:30.  On Wed she has appointnet [facility].  I scheduled for for Monday with Dr. Heber Ellis.She states she can not get a ride sooner. She wants to know if she should stop Metformin? Pt # [id number]

## 2014-08-06 NOTE — Telephone Encounter (Signed)
Talked with pt and she will stop Metformin and losartan.  She is not taking Januvia. She watches her CBG and has not had any lows.   Will see when Edwena Blow can get transportation.

## 2014-08-06 NOTE — Telephone Encounter (Signed)
CT was ordered with contrast - that will need to be cancelled until Cr straightened out.  Pls stop metformin (GFR<30), decrease Januvia to 50 mg pill take ONE HALF pill daily, stop losartan (BP low nl).  I am not comfortable waiting until Monday to F/U. I am [place] - she has medicaid - can she not get transport that way or can we get her a bus pass? I would like her to F/U early this week ( Tues / Wed ish).  She needs to check her CBG bc at risk of hypoglycemia from the glucotrol (unlikely since stopping and decreasing other meds).

## 2014-08-06 NOTE — Assessment & Plan Note (Signed)
CT was ordered with contrast - that will need to be cancelled until Cr straightened out.  Pls stop metformin (GFR<30), decrease Januvia to 50 mg pill take ONE HALF pill daily, stop losartan (BP low nl).  I am not comfortable waiting until Monday to F/U. I am [name] - she has medicaid - can she not get transport that way or can we get her a bus pass? I would like her to F/U early this week ( Tues / Wed ish).  She needs to check her CBG bc at risk of hypoglycemia from the glucotrol (unlikely since stopping and decreasing other meds).

## 2014-08-07 ENCOUNTER — Telehealth: Payer: Self-pay | Admitting: Licensed Clinical Social Worker

## 2014-08-07 ENCOUNTER — Telehealth: Payer: Self-pay

## 2014-08-07 NOTE — Telephone Encounter (Signed)
Transportation arranged and confirmed for an appointment at [facility] on 08/09/14 at 9:15am.  Pt notified of appointment and pick up time frame.

## 2014-08-07 NOTE — Telephone Encounter (Signed)
CSW placed call to Ms. [name] to follow up on transportation needs for urgent appointment this week.  Ms. [name] confirms she uses [facility] for transportation and states she was going to cancel her transportation for Thursday's 08/09/14 CT scan and try to schedule for [facility].  CSW offered to assist Ms. [name] with this canceling and rescheduling, pt appreciative.  Once, pt is scheduled for 6/16 [facility] appt, CSW will contact [employer].

## 2014-08-07 NOTE — Telephone Encounter (Signed)
Spoke with Marzetta Board from CT who had seen that patient's creatinine was elevated; she noted that Dr. Lynnae January had stated in a phone note that CT needed to be cancelled because of her creatinine - Stacy wanted to clarifiy that this was correct and what Dr. Henrene Pastor wanted.  I confirmed with Dr. Henrene Pastor that it was appropriate for Ct to be cancelled.  Relayed this to [facility] who is going to call the patient to let her the test had been cancelled and could be rescheduled when her creatinine was under control.

## 2014-08-07 NOTE — Telephone Encounter (Signed)
Perfect and thanks for your help !!

## 2014-08-07 NOTE — Telephone Encounter (Signed)
Pt scheduled for 6/16 at 2:45

## 2014-08-07 NOTE — Telephone Encounter (Signed)
Spoke with [name], pt does use Medicaid transportation.  If pt can be scheduled 6/16 around 1:30pm, I will contact transportation on behalf of [name] and scheduled transportation to her appointment at [facility].

## 2014-08-07 NOTE — Telephone Encounter (Signed)
6/16 OK with me. The earliest in the day the better for stat labs to return. Thanks!

## 2014-08-08 ENCOUNTER — Telehealth: Payer: Self-pay | Admitting: Licensed Clinical Social Worker

## 2014-08-08 NOTE — Telephone Encounter (Signed)
[name] placed call to CSW stating she was trying to reach the nurses as she was referred to an eye doctor.  Pt has an appointment today but will need to cancel because she is not feeling well.  Pt did not have the number to the eye doctor to cancel and reschedule appointment.  CSW took message and relay to nursing referral staff.  CSW placed call to [name] and canceled on behalf of [name].

## 2014-08-09 ENCOUNTER — Ambulatory Visit (INDEPENDENT_AMBULATORY_CARE_PROVIDER_SITE_OTHER): Payer: Medicaid Other | Admitting: Internal Medicine

## 2014-08-09 ENCOUNTER — Encounter: Payer: Self-pay | Admitting: Internal Medicine

## 2014-08-09 ENCOUNTER — Inpatient Hospital Stay: Admission: RE | Admit: 2014-08-09 | Payer: Medicaid Other | Source: Ambulatory Visit

## 2014-08-09 VITALS — BP 119/52 | HR 74 | Temp 98.4°F | Ht 65.0 in | Wt 144.3 lb

## 2014-08-09 DIAGNOSIS — E1140 Type 2 diabetes mellitus with diabetic neuropathy, unspecified: Secondary | ICD-10-CM | POA: Diagnosis present

## 2014-08-09 DIAGNOSIS — Z794 Long term (current) use of insulin: Secondary | ICD-10-CM | POA: Diagnosis not present

## 2014-08-09 DIAGNOSIS — E1122 Type 2 diabetes mellitus with diabetic chronic kidney disease: Secondary | ICD-10-CM | POA: Diagnosis not present

## 2014-08-09 DIAGNOSIS — N189 Chronic kidney disease, unspecified: Secondary | ICD-10-CM

## 2014-08-09 DIAGNOSIS — I10 Essential (primary) hypertension: Secondary | ICD-10-CM

## 2014-08-09 DIAGNOSIS — N179 Acute kidney failure, unspecified: Secondary | ICD-10-CM | POA: Insufficient documentation

## 2014-08-09 DIAGNOSIS — E11319 Type 2 diabetes mellitus with unspecified diabetic retinopathy without macular edema: Secondary | ICD-10-CM

## 2014-08-09 DIAGNOSIS — I129 Hypertensive chronic kidney disease with stage 1 through stage 4 chronic kidney disease, or unspecified chronic kidney disease: Secondary | ICD-10-CM

## 2014-08-09 DIAGNOSIS — N183 Chronic kidney disease, stage 3 (moderate): Secondary | ICD-10-CM

## 2014-08-09 LAB — BASIC METABOLIC PANEL
ANION GAP: 7 (ref 5–15)
BUN: 37 mg/dL — ABNORMAL HIGH (ref 6–20)
CALCIUM: 8.9 mg/dL (ref 8.9–10.3)
CO2: 23 mmol/L (ref 22–32)
CREATININE: 2.27 mg/dL — AB (ref 0.44–1.00)
Chloride: 104 mmol/L (ref 101–111)
GFR calc non Af Amer: 22 mL/min — ABNORMAL LOW (ref >60)
GFR, EST AFRICAN AMERICAN: 25 mL/min — AB (ref >60)
Glucose, Bld: 104 mg/dL — ABNORMAL HIGH (ref 65–99)
Potassium: 5.1 mmol/L (ref 3.5–5.1)
Sodium: 134 mmol/L — ABNORMAL LOW (ref 135–145)

## 2014-08-09 LAB — GLUCOSE, CAPILLARY: Glucose-Capillary: 116 mg/dL — ABNORMAL HIGH (ref 65–99)

## 2014-08-09 LAB — POCT GLYCOSYLATED HEMOGLOBIN (HGB A1C): Hemoglobin A1C: 7.4

## 2014-08-09 NOTE — Assessment & Plan Note (Addendum)
BP Readings from Last 3 Encounters:  08/09/14 119/52  08/03/14 110/60  07/02/14 125/64    Lab Results  Component Value Date   NA 134* 08/09/2014   K 5.1 08/09/2014   CREATININE 2.27* 08/09/2014    Assessment: Blood pressure control: controlled Progress toward BP goal:  at goal Comments: off losartan x 3 days  Plan: Medications:  none Educational resources provided:   Self management tools provided:   Other plans: continue to remain off Losartan, given her weight loss she may no longer need BP medication, will have her back in 2 weeks for recheck

## 2014-08-09 NOTE — Assessment & Plan Note (Signed)
-   Checked STAT BMP> Cr remains elevated at 2.25.  The most likely explaniation is CIAKI from her CT abd and pelvis in April.   - WIll check U/A urine lytes, no need to renal ultrasound at this time (do not expect hydronephrosis and likely will show known medical renal disease) - Continue to avoid glypizide, metformin and losartan. - Return in 2 weeks for BMP recheck. Will hold off on repeat CT with contrast at this time. If renal function does not improve significantly she may need to be pre hydrated before a CT with contrast can be preformed.

## 2014-08-09 NOTE — Patient Instructions (Signed)
General Instructions:  I want you to continue taking Lantus 25 units a day, please keep checking your sugars, if they are low you need to cut back on the insulin and call our office.  It is safe to take 50mg  of Januvia.  Please do not taking Glipizide, Metformin, or losartan.  Remember to stay hydrated.  Please bring your medicines with you each time you come to clinic.  Medicines may include prescription medications, over-the-counter medications, herbal remedies, eye drops, vitamins, or other pills.   Progress Toward Treatment Goals:  Treatment Goal 08/09/2014  Hemoglobin A1C unchanged  Blood pressure at goal  Stop smoking -    Self Care Goals & Plans:  Self Care Goal 08/09/2014  Manage my medications take my medicines as prescribed; refill my medications on time; bring my medications to every visit  Monitor my health keep track of my blood glucose; bring my glucose meter and log to each visit; keep track of my blood pressure; check my feet daily  Eat healthy foods eat more vegetables; eat foods that are low in salt; eat baked foods instead of fried foods; drink diet soda or water instead of juice or soda  Be physically active take a walk every day; find workout friends  Stop smoking set a quit date and stop smoking    Home Blood Glucose Monitoring 08/09/2014  Check my blood sugar 3 times a day  When to check my blood sugar before meals     Care Management & Community Referrals:  Referral 03/29/2014  Referrals made for care management support none needed

## 2014-08-09 NOTE — Assessment & Plan Note (Signed)
Lab Results  Component Value Date   HGBA1C 7.4 08/09/2014   HGBA1C 7.3 03/29/2014   HGBA1C 7.4* 12/02/2013     Assessment: Diabetes control: fair control Progress toward A1C goal:  unchanged Comments:   Plan: Medications:  Lantus 25 units daily, Januvia 50mg  daily Home glucose monitoring: Frequency: 3 times a day Timing: before meals Instruction/counseling given: reminded to bring blood glucose meter & log to each visit, reminded to bring medications to each visit and discussed sick day management Educational resources provided:   Self management tools provided:   Other plans: patient has already restarted lantus, will continue this given her renal function fluctuations.  She reports she has restarted at 25 units a day and reports good control with no hypoglycemic.

## 2014-08-09 NOTE — Progress Notes (Signed)
[facility] Subjective:   Patient ID: [name] female   DOB: Nov 24, 1950 64 y.o.   MRN: [id number]  HPI: Kiara.[name] is a 64 y.o. female with a PMH detailed below who presents for a follow up for elevated creatinine.  In April [name] was seen in the ED for abdominal pain, her Creatinine at that time was at her baseline of around 1.6.  A CT scan of abd/pelvis was preformed for evaluation and [name] was found to have a pancreatic pseudocyst and some biliarry duct dilitation.  [name] was referred back to GI and saw Dr Henrene Pastor 6 days ago. Recheck of lab work at that time showed the creatine had increased to 2.4 (estimated GFR of 26).  Dr. Lynnae January called [name] and instructed her to hold losartan, metformin and decrease januvia to 37m daily.  A repeat CT of abd/ pelvis was originally ordered but now has been cancel pending evaluation of her renal function.  [name] reports that overall [name] has been doing well. [name] has stopped the medications as instructed.  [name] has also stopped jTongaand [name] was able to fill an old prescription for lantus and has been taking 25 units QHS.  [name] has been checking her sugars tid and notes that the lowest readings are around 100, [name] has had no readings over 250.  [name] did not bring her meter.  [name] has been drinking a lot of water and trying to stay hydrated, [name] estimates 7 glasses of water a day.  [name] denies any abdominal complaints other than constipation.  [name] denies any abdominal pain nausea or vomiting.    Past Medical History  Diagnosis Date  . Diabetes mellitus   . Hypertension   . ANEMIA, CHRONIC DISEASE NEC 03/15/2006  . DEGENERATIVE JOINT DISEASE 05/06/2007    On going for >5 yrs No imaging to confirm Has tried ultram, mobic, neurontin which did not work [facility] pers[facility] once worked well    . 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 DIABETIC  RETINOPATHY 03/15/2006  . Hyperlipidemia 04/01/2011  . PANCREATITIS, CHRONIC 03/29/2006  . RENAL  INSUFFICIENCY, CHRONIC 03/15/2006  . Peripheral arterial disease   . Hemorrhoids    Current Outpatient Prescriptions  Medication Sig Dispense Refill  . albuterol (PROVENTIL HFA) 108 (90 BASE) MCG/ACT inhaler Inhale 2 puffs into the lungs every 6 (six) hours as needed for wheezing. 6.7 g 6  . alendronate (FOSAMAX) 70 MG tablet Take 1 tablet (70 mg total) by mouth every 7 (seven) days. Take with a full glass of water on an empty stomach. Takes on Tues. 4 tablet 5  . amitriptyline (ELAVIL) 25 MG tablet Take 1 tablet (25 mg total) by mouth at bedtime. 90 tablet 0  . ATROVENT HFA 17 MCG/ACT inhaler inhale 1 to 2 puffs every 6 hours if needed 12.9 g 2  . Calcium Carbonate-Vitamin D (CALCIUM-VITAMIN D) 500-200 MG-UNIT per tablet Take 1 tablet by mouth 2 (two) times daily. (Patient taking differently: Take 1 tablet by mouth daily. ) 60 tablet 6  . insulin glargine (LANTUS) 100 UNIT/ML injection Inject 20-25 Units into the skin at bedtime.    . Na Sulfate-K Sulfate-Mg Sulf SOLN Take 1 kit by mouth once. 354 mL 0  . Pancrelipase, Lip-Prot-Amyl, 24000 UNITS CPEP Take 3 tablets With meals and 2 tablets with snacks. No more than 10/24hours 180 capsule 11  . polyethylene glycol (MIRALAX / GLYCOLAX) packet Take 17 g by mouth daily. (Patient taking differently: Take 17  g by mouth daily as needed for mild constipation. ) 14 each 2  . promethazine (PHENERGAN) 12.5 MG tablet Take 1 tablet (12.5 mg total) by mouth every 6 (six) hours as needed for nausea or vomiting. 60 tablet 2  . silver sulfADIAZINE (SILVADENE) 1 % cream Apply 1 application topically daily. 50 g 0  . traMADol (ULTRAM) 50 MG tablet Take 1 tablet (50 mg total) by mouth every 8 (eight) hours as needed for moderate pain. 60 tablet 2  . travoprost, benzalkonium, (TRAVATAN) 0.004 % ophthalmic solution Place 1 drop into both eyes at bedtime.    Marland Kitchen glucose blood (ACCU-CHEK AVIVA) test strip Use as instructed 100 each 12  . sitaGLIPtin (JANUVIA) 50 MG tablet  Take 1 tablet (50 mg total) by mouth daily. (Patient not taking: Reported on 08/09/2014) 30 tablet 2   No current facility-administered medications for this visit.   No family history on file. History   Social History  . Marital Status: Single    Spouse Name: N/A  . Number of Children: N/A  . Years of Education: N/A   Social History Main Topics  . Smoking status: Current Some Day Smoker -- 0.20 packs/day    Types: Cigarettes  . Smokeless tobacco: Never Used     Comment: 3 -4 cigs  . Alcohol Use: No  . Drug Use: No  . Sexual Activity: Not on file   Other Topics Concern  . None   Social History Narrative   Review of Systems: Review of Systems  Constitutional: Negative for fever and chills.  Eyes: Negative for blurred vision and double vision.  Respiratory: Negative for cough and shortness of breath.   Cardiovascular: Negative for chest pain and leg swelling.  Gastrointestinal: Positive for constipation. Negative for heartburn, nausea, vomiting, abdominal pain and diarrhea.  Genitourinary: Negative for dysuria.  Skin: Negative for rash.  Neurological: Positive for headaches (occasionally). Negative for dizziness.  Psychiatric/Behavioral: Negative for substance abuse.     Objective:  Physical Exam: Filed Vitals:   08/09/14 0932  BP: 119/52  Pulse: 74  Temp: 98.4 F (36.9 C)  TempSrc: Oral  Height: $Remove'5\' 5"'[id number]$  (1.651 m)  Weight: 144 lb 4.8 oz (65.454 kg)  SpO2: 100%   Physical Exam  Constitutional: [name] is well-developed, well-nourished, and in no distress.  HENT:  Head: Normocephalic and atraumatic.  Eyes: Pupils are equal, round, and reactive to light.  Cardiovascular: Normal rate, regular rhythm, normal heart sounds and intact distal pulses.   No murmur heard. Pulmonary/Chest: Effort normal and breath sounds normal. No respiratory distress. [name] has no wheezes. [name] has no rales.  Abdominal: Soft. Bowel sounds are normal. [name] exhibits no distension. There is no  tenderness. There is no rebound.  Musculoskeletal: [name] exhibits no edema.  Psychiatric: Affect normal.  Nursing note and vitals reviewed.    Assessment & Plan:  Case discussed with Dr. Daryll Drown  Essential hypertension BP Readings from Last 3 Encounters:  08/09/14 119/52  08/03/14 110/60  07/02/14 125/64    Lab Results  Component Value Date   NA 134* 08/09/2014   K 5.1 08/09/2014   CREATININE 2.27* 08/09/2014    Assessment: Blood pressure control: controlled Progress toward BP goal:  at goal Comments: off losartan x 3 days  Plan: Medications:  none Educational resources provided:   Self management tools provided:   Other plans: continue to remain off Losartan, given her weight loss [name] may no longer need BP medication, will have her back in 2 weeks  for recheck   Diabetes mellitus type 2 with retinopathy Lab Results  Component Value Date   HGBA1C 7.4 08/09/2014   HGBA1C 7.3 03/29/2014   HGBA1C 7.4* 12/02/2013     Assessment: Diabetes control: fair control Progress toward A1C goal:  unchanged Comments:   Plan: Medications:  Lantus 25 units daily, Januvia 82m daily Home glucose monitoring: Frequency: 3 times a day Timing: before meals Instruction/counseling given: reminded to bring blood glucose meter & log to each visit, reminded to bring medications to each visit and discussed sick day management Educational resources provided:   Self management tools provided:   Other plans: patient has already restarted lantus, will continue this given her renal function fluctuations.  [name] reports [name] has restarted at 25 units a day and reports good control with no hypoglycemic.     Diabetic neuropathy, painful - Amitriptyline has been working. Will continue.  Acute-on-chronic kidney injury - Checked STAT BMP> Cr remains elevated at 2.25.  The most likely explaniation is CIAKI from her CT abd and pelvis in April.   - WIll check U/A urine lytes, no need to renal  ultrasound at this time (do not expect hydronephrosis and likely will show known medical renal disease) - Continue to avoid glypizide, metformin and losartan. - Return in 2 weeks for BMP recheck. Will hold off on repeat CT with contrast at this time. If renal function does not improve significantly [name] may need to be pre hydrated before a CT with contrast can be preformed.    Medications Ordered Meds ordered this encounter  Medications  . insulin glargine (LANTUS) 100 UNIT/ML injection    Sig: Inject 20-25 Units into the skin at bedtime.   Other Orders Orders Placed This Encounter  Procedures  . Glucose, capillary  . Basic metabolic panel  . Urinalysis, Reflex Microscopic  . Creatinine, Urine  . Sodium, Urine  . POCT HgB A1C

## 2014-08-09 NOTE — Assessment & Plan Note (Signed)
-   Amitriptyline has been working. Will continue.

## 2014-08-10 LAB — URINALYSIS, ROUTINE W REFLEX MICROSCOPIC
Bilirubin Urine: NEGATIVE
Glucose, UA: NEGATIVE mg/dL
Hgb urine dipstick: NEGATIVE
Ketones, ur: NEGATIVE mg/dL
Leukocytes, UA: NEGATIVE
Nitrite: NEGATIVE
Protein, ur: NEGATIVE mg/dL
Specific Gravity, Urine: 1.013 (ref 1.005–1.030)
Urobilinogen, UA: 0.2 mg/dL (ref 0.0–1.0)
pH: 5 (ref 5.0–8.0)

## 2014-08-10 LAB — SODIUM, URINE, RANDOM: Sodium, Ur: 35 mEq/L

## 2014-08-10 LAB — CREATININE, URINE, RANDOM: Creatinine, Urine: 133.9 mg/dL

## 2014-08-13 ENCOUNTER — Ambulatory Visit: Payer: Medicaid Other | Admitting: Internal Medicine

## 2014-08-14 ENCOUNTER — Ambulatory Visit: Payer: Medicaid Other | Admitting: Podiatry

## 2014-08-14 NOTE — Progress Notes (Signed)
Internal Medicine Clinic Attending  Case discussed with Dr. Hoffman soon after the resident saw the patient.  We reviewed the resident's history and exam and pertinent patient test results.  I agree with the assessment, diagnosis, and plan of care documented in the resident's note. 

## 2014-08-24 ENCOUNTER — Other Ambulatory Visit: Payer: Self-pay | Admitting: *Deleted

## 2014-08-24 ENCOUNTER — Telehealth: Payer: Self-pay | Admitting: Internal Medicine

## 2014-08-24 DIAGNOSIS — M810 Age-related osteoporosis without current pathological fracture: Secondary | ICD-10-CM

## 2014-08-28 ENCOUNTER — Encounter: Payer: Self-pay | Admitting: Podiatry

## 2014-08-28 ENCOUNTER — Ambulatory Visit (INDEPENDENT_AMBULATORY_CARE_PROVIDER_SITE_OTHER): Payer: Medicaid Other | Admitting: Podiatry

## 2014-08-28 VITALS — BP 154/76 | HR 78 | Temp 96.7°F | Resp 12

## 2014-08-28 DIAGNOSIS — E1142 Type 2 diabetes mellitus with diabetic polyneuropathy: Secondary | ICD-10-CM | POA: Diagnosis not present

## 2014-08-28 DIAGNOSIS — B351 Tinea unguium: Secondary | ICD-10-CM | POA: Diagnosis not present

## 2014-08-28 DIAGNOSIS — L97501 Non-pressure chronic ulcer of other part of unspecified foot limited to breakdown of skin: Secondary | ICD-10-CM | POA: Diagnosis not present

## 2014-08-28 DIAGNOSIS — M79676 Pain in unspecified toe(s): Secondary | ICD-10-CM | POA: Diagnosis not present

## 2014-08-28 DIAGNOSIS — L84 Corns and callosities: Secondary | ICD-10-CM

## 2014-08-28 MED ORDER — ALENDRONATE SODIUM 70 MG PO TABS: 70.0000 mg | ORAL_TABLET | ORAL | Status: DC

## 2014-08-28 MED ORDER — "INSULIN SYRINGE-NEEDLE U-100 31G X 5/16"" 0.5 ML MISC": 1.0000 [IU] | Status: DC

## 2014-08-28 NOTE — Patient Instructions (Signed)
Apply Silvadene cream to skin ulcers on the bottom of the right foot daily and cover with gauze Wear surgical shoe on the right foot Limits standing and walking      Diabetes and Foot Care Diabetes may cause you to have problems because of poor blood supply (circulation) to your feet and legs. This may cause the skin on your feet to become thinner, break easier, and heal more slowly. Your skin may become dry, and the skin may peel and crack. You may also have nerve damage in your legs and feet causing decreased feeling in them. You may not notice minor injuries to your feet that could lead to infections or more serious problems. Taking care of your feet is one of the most important things you can do for yourself.  HOME CARE INSTRUCTIONS  Wear shoes at all times, even in the house. Do not go barefoot. Bare feet are easily injured.  Check your feet daily for blisters, cuts, and redness. If you cannot see the bottom of your feet, use a mirror or ask someone for help.  Wash your feet with warm water (do not use hot water) and mild soap. Then pat your feet and the areas between your toes until they are completely dry. Do not soak your feet as this can dry your skin.  Apply a moisturizing lotion or petroleum jelly (that does not contain alcohol and is unscented) to the skin on your feet and to dry, brittle toenails. Do not apply lotion between your toes.  Trim your toenails straight across. Do not dig under them or around the cuticle. File the edges of your nails with an emery board or nail file.  Do not cut corns or calluses or try to remove them with medicine.  Wear clean socks or stockings every day. Make sure they are not too tight. Do not wear knee-high stockings since they may decrease blood flow to your legs.  Wear shoes that fit properly and have enough cushioning. To break in new shoes, wear them for just a few hours a day. This prevents you from injuring your feet. Always look in your  shoes before you put them on to be sure there are no objects inside.  Do not cross your legs. This may decrease the blood flow to your feet.  If you find a minor scrape, cut, or break in the skin on your feet, keep it and the skin around it clean and dry. These areas may be cleansed with mild soap and water. Do not cleanse the area with peroxide, alcohol, or iodine.  When you remove an adhesive bandage, be sure not to damage the skin around it.  If you have a wound, look at it several times a day to make sure it is healing.  Do not use heating pads or hot water bottles. They may burn your skin. If you have lost feeling in your feet or legs, you may not know it is happening until it is too late.  Make sure your health care provider performs a complete foot exam at least annually or more often if you have foot problems. Report any cuts, sores, or bruises to your health care provider immediately. SEEK MEDICAL CARE IF:   You have an injury that is not healing.  You have cuts or breaks in the skin.  You have an ingrown nail.  You notice redness on your legs or feet.  You feel burning or tingling in your legs or feet.  You have pain or cramps in your legs and feet.  Your legs or feet are numb.  Your feet always feel cold. SEEK IMMEDIATE MEDICAL CARE IF:   There is increasing redness, swelling, or pain in or around a wound.  There is a red line that goes up your leg.  Pus is coming from a wound.  You develop a fever or as directed by your health care provider.  You notice a bad smell coming from an ulcer or wound. Document Released: 02/07/2000 Document Revised: 10/12/2012 Document Reviewed: 07/19/2012 [facility] Patient Information 2015 [place], [place]. This information is not intended to replace advice given to you by your health care provider. Make sure you discuss any questions you have with your health care provider. Diabetes and Foot Care Diabetes may cause you to have  problems because of poor blood supply (circulation) to your feet and legs. This may cause the skin on your feet to become thinner, break easier, and heal more slowly. Your skin may become dry, and the skin may peel and crack. You may also have nerve damage in your legs and feet causing decreased feeling in them. You may not notice minor injuries to your feet that could lead to infections or more serious problems. Taking care of your feet is one of the most important things you can do for yourself.  HOME CARE INSTRUCTIONS  Wear shoes at all times, even in the house. Do not go barefoot. Bare feet are easily injured.  Check your feet daily for blisters, cuts, and redness. If you cannot see the bottom of your feet, use a mirror or ask someone for help.  Wash your feet with warm water (do not use hot water) and mild soap. Then pat your feet and the areas between your toes until they are completely dry. Do not soak your feet as this can dry your skin.  Apply a moisturizing lotion or petroleum jelly (that does not contain alcohol and is unscented) to the skin on your feet and to dry, brittle toenails. Do not apply lotion between your toes.  Trim your toenails straight across. Do not dig under them or around the cuticle. File the edges of your nails with an emery board or nail file.  Do not cut corns or calluses or try to remove them with medicine.  Wear clean socks or stockings every day. Make sure they are not too tight. Do not wear knee-high stockings since they may decrease blood flow to your legs.  Wear shoes that fit properly and have enough cushioning. To break in new shoes, wear them for just a few hours a day. This prevents you from injuring your feet. Always look in your shoes before you put them on to be sure there are no objects inside.  Do not cross your legs. This may decrease the blood flow to your feet.  If you find a minor scrape, cut, or break in the skin on your feet, keep it and the  skin around it clean and dry. These areas may be cleansed with mild soap and water. Do not cleanse the area with peroxide, alcohol, or iodine.  When you remove an adhesive bandage, be sure not to damage the skin around it.  If you have a wound, look at it several times a day to make sure it is healing.  Do not use heating pads or hot water bottles. They may burn your skin. If you have lost feeling in your feet or legs, you  may not know it is happening until it is too late.  Make sure your health care provider performs a complete foot exam at least annually or more often if you have foot problems. Report any cuts, sores, or bruises to your health care provider immediately. SEEK MEDICAL CARE IF:   You have an injury that is not healing.  You have cuts or breaks in the skin.  You have an ingrown nail.  You notice redness on your legs or feet.  You feel burning or tingling in your legs or feet.  You have pain or cramps in your legs and feet.  Your legs or feet are numb.  Your feet always feel cold. SEEK IMMEDIATE MEDICAL CARE IF:   There is increasing redness, swelling, or pain in or around a wound.  There is a red line that goes up your leg.  Pus is coming from a wound.  You develop a fever or as directed by your health care provider.  You notice a bad smell coming from an ulcer or wound. Document Released: 02/07/2000 Document Revised: 10/12/2012 Document Reviewed: 07/19/2012 [facility] Patient Information 2015 [place], [place]. This information is not intended to replace advice given to you by your health care provider. Make sure you discuss any questions you have with your health care provider.

## 2014-08-28 NOTE — Telephone Encounter (Signed)
Need to get repeat DEXA to see if patient can go on drug holiday from alendronate, has already been ordered.

## 2014-08-28 NOTE — Progress Notes (Signed)
Patient ID: [name], female   DOB: 02-14-1951, 65 y.o.   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 She is also complaining of painful toenails. Last scheduled visit for this patient was 07/02/2014 with a revisit scheduled 3 weeks from that date. Patient not present in office until today stating that she had kidney did disease which prevented her from presenting for her scheduled visit  Objective: Right foot [place] first and r fifth right MPJ is macerated granular ulcers 5 mm in diameter Toenails 1-5 are hypertrophic, brittle, discolored, incurvated and tender direct palpation  Left foot Bleeding callus plantar first and fifth MPJ left The toenails 1-5 are hypertrophic, brittle, discolored, incurvated and tender to direct palpation  Assessment: Superficial ulcerations 2 right Pre-ulcerative plantar callus 2 left Symptomatic onychomycoses 6-10  Plan: Debridement of pre-ulcerative and ulcerative lesions 4 Apply Silvadene dressings to the skin lesions right Debridement of toenails 6-10 without any bleeding  Patient advised to wear surgical shoe on right foot Apply Silvadene cream to skin ulcers and right foot daily and cover with gauze  Reappoint 2 weeks

## 2014-08-30 ENCOUNTER — Other Ambulatory Visit: Payer: Self-pay | Admitting: Internal Medicine

## 2014-09-04 ENCOUNTER — Encounter: Payer: Medicaid Other | Admitting: Internal Medicine

## 2014-09-04 ENCOUNTER — Other Ambulatory Visit: Payer: Self-pay | Admitting: *Deleted

## 2014-09-05 MED ORDER — POLYETHYLENE GLYCOL 3350 17 G PO PACK: 17.0000 g | PACK | Freq: Every day | ORAL | Status: DC | PRN

## 2014-09-11 ENCOUNTER — Ambulatory Visit: Payer: Medicaid Other | Admitting: Podiatry

## 2014-09-25 ENCOUNTER — Other Ambulatory Visit: Payer: Self-pay | Admitting: Internal Medicine

## 2014-09-25 DIAGNOSIS — M810 Age-related osteoporosis without current pathological fracture: Secondary | ICD-10-CM

## 2014-09-25 MED ORDER — AMITRIPTYLINE HCL 25 MG PO TABS: 25.0000 mg | ORAL_TABLET | Freq: Every day | ORAL | Status: DC

## 2014-09-25 MED ORDER — IPRATROPIUM BROMIDE HFA 17 MCG/ACT IN AERS: INHALATION_SPRAY | RESPIRATORY_TRACT | Status: DC

## 2014-09-25 NOTE — Telephone Encounter (Signed)
Alendronate again not filled, she may need a drug holiday, I am waiting for her to complete her repeat DEXA scan

## 2014-09-25 NOTE — Telephone Encounter (Signed)
Pt requesting meds to be filled.

## 2014-09-25 NOTE — Telephone Encounter (Signed)
Pt informed

## 2014-10-04 ENCOUNTER — Encounter: Payer: Medicaid Other | Admitting: Internal Medicine

## 2014-10-04 ENCOUNTER — Encounter: Payer: Self-pay | Admitting: Internal Medicine

## 2014-10-24 ENCOUNTER — Other Ambulatory Visit: Payer: Self-pay | Admitting: Internal Medicine

## 2014-10-25 MED ORDER — TRAMADOL HCL 50 MG PO TABS: 50.0000 mg | ORAL_TABLET | Freq: Three times a day (TID) | ORAL | Status: DC | PRN

## 2014-10-25 NOTE — Telephone Encounter (Signed)
Called tramadol to pharm 

## 2014-10-25 NOTE — Telephone Encounter (Signed)
Rx called in to pharmacy. Hilda Blades Neymar Dowe RN 10/25/14 2:40PM

## 2014-10-26 ENCOUNTER — Encounter (HOSPITAL_COMMUNITY): Payer: Self-pay | Admitting: *Deleted

## 2014-10-26 ENCOUNTER — Emergency Department (HOSPITAL_COMMUNITY)
Admission: EM | Admit: 2014-10-26 | Discharge: 2014-10-26 | Disposition: A | Discharge status: Home / Self Care | Payer: Medicaid Other | Attending: Emergency Medicine | Admitting: Emergency Medicine

## 2014-10-26 DIAGNOSIS — N189 Chronic kidney disease, unspecified: Secondary | ICD-10-CM | POA: Insufficient documentation

## 2014-10-26 DIAGNOSIS — Z862 Personal history of diseases of the blood and blood-forming organs and certain disorders involving the immune mechanism: Secondary | ICD-10-CM | POA: Insufficient documentation

## 2014-10-26 DIAGNOSIS — E1139 Type 2 diabetes mellitus with other diabetic ophthalmic complication: Secondary | ICD-10-CM | POA: Diagnosis not present

## 2014-10-26 DIAGNOSIS — I129 Hypertensive chronic kidney disease with stage 1 through stage 4 chronic kidney disease, or unspecified chronic kidney disease: Secondary | ICD-10-CM | POA: Insufficient documentation

## 2014-10-26 DIAGNOSIS — Z79899 Other long term (current) drug therapy: Secondary | ICD-10-CM | POA: Diagnosis not present

## 2014-10-26 DIAGNOSIS — N938 Other specified abnormal uterine and vaginal bleeding: Secondary | ICD-10-CM | POA: Diagnosis not present

## 2014-10-26 DIAGNOSIS — Z8719 Personal history of other diseases of the digestive system: Secondary | ICD-10-CM | POA: Insufficient documentation

## 2014-10-26 DIAGNOSIS — E1149 Type 2 diabetes mellitus with other diabetic neurological complication: Secondary | ICD-10-CM | POA: Insufficient documentation

## 2014-10-26 DIAGNOSIS — N939 Abnormal uterine and vaginal bleeding, unspecified: Secondary | ICD-10-CM

## 2014-10-26 DIAGNOSIS — Z720 Tobacco use: Secondary | ICD-10-CM | POA: Diagnosis not present

## 2014-10-26 DIAGNOSIS — M1990 Unspecified osteoarthritis, unspecified site: Secondary | ICD-10-CM | POA: Diagnosis not present

## 2014-10-26 DIAGNOSIS — Z794 Long term (current) use of insulin: Secondary | ICD-10-CM | POA: Insufficient documentation

## 2014-10-26 LAB — WET PREP, GENITAL
Trich, Wet Prep: NONE SEEN
WBC WET PREP: NONE SEEN
YEAST WET PREP: NONE SEEN

## 2014-10-26 NOTE — ED Notes (Signed)
Pt c/o vaginal bleeding onset today. LMP "when I was 54-55." reports mild bleeding started as pink and progressed to darker red. Denies pain

## 2014-10-26 NOTE — ED Provider Notes (Signed)
CSN: [id number]     Arrival date & time 10/26/14  1827 History   First MD Initiated Contact with Patient 10/26/14 1919     Chief Complaint  Patient presents with  . Vaginal Bleeding     (Consider location/radiation/quality/duration/timing/severity/associated sxs/prior Treatment) Patient is a 64 y.o. female presenting with vaginal bleeding.  Vaginal Bleeding Quality:  Dark red (pink) Severity:  Mild Onset quality:  Gradual Duration:  12 hours Timing:  Constant Progression:  Unchanged Chronicity:  New Menstrual history:  Postmenopausal Context: spontaneously   Relieved by:  Nothing Worsened by:  Nothing tried Associated symptoms: no abdominal pain, no dysuria, no fever, no nausea and no vaginal discharge   Risk factors: no new sexual partner     Past Medical History  Diagnosis Date  . Diabetes mellitus   . Hypertension   . ANEMIA, CHRONIC DISEASE NEC 03/15/2006  . DEGENERATIVE JOINT DISEASE 05/06/2007    On going for >5 yrs No imaging to confirm Has tried ultram, mobic, neurontin which did not work Ambulance person tried once worked well    . 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 DIABETIC  RETINOPATHY 03/15/2006  . Hyperlipidemia 04/01/2011  . PANCREATITIS, CHRONIC 03/29/2006  . RENAL INSUFFICIENCY, CHRONIC 03/15/2006  . Peripheral arterial disease   . Hemorrhoids    Past Surgical History  Procedure Laterality Date  . Breast surgery    . Mouth surgery N/A    History reviewed. No pertinent family history. Social History  Substance Use Topics  . Smoking status: Current Some Day Smoker -- 0.20 packs/day    Types: Cigarettes  . Smokeless tobacco: Never Used     Comment: 3 -4 cigs  . Alcohol Use: No   OB History    No data available     Review of Systems  Constitutional: Negative for fever.  Gastrointestinal: Negative for nausea and abdominal pain.  Genitourinary: Positive for vaginal bleeding. Negative for dysuria and vaginal discharge.  All other systems reviewed and  are negative.     Allergies  Review of patient's allergies indicates no known allergies.  Home Medications   Prior to Admission medications   Medication Sig Start Date End Date Taking? Authorizing Provider  acetaminophen (TYLENOL) 500 MG tablet Take 1,000 mg by mouth every 6 (six) hours as needed for moderate pain.   Yes Historical Provider, MD  albuterol (PROVENTIL HFA) 108 (90 BASE) MCG/ACT inhaler Inhale 2 puffs into the lungs every 6 (six) hours as needed for wheezing. 12/22/13 12/22/14 Yes Lucious Groves, DO  amitriptyline (ELAVIL) 25 MG tablet Take 1 tablet (25 mg total) by mouth at bedtime. 09/25/14  Yes Lucious Groves, DO  insulin glargine (LANTUS) 100 UNIT/ML injection Inject 15 Units into the skin every morning.    Yes Historical Provider, MD  Pancrelipase, Lip-Prot-Amyl, 24000 UNITS CPEP Take 3 tablets With meals and 2 tablets with snacks. No more than 10/24hours 07/31/14  Yes Lucious Groves, DO  polyethylene glycol (MIRALAX / GLYCOLAX) packet Take 17 g by mouth daily as needed for mild constipation. 09/05/14  Yes Lucious Groves, DO  promethazine (PHENERGAN) 12.5 MG tablet Take 1 tablet (12.5 mg total) by mouth every 6 (six) hours as needed for nausea or vomiting. 06/01/14  Yes Lucious Groves, DO  traMADol (ULTRAM) 50 MG tablet Take 1 tablet (50 mg total) by mouth every 8 (eight) hours as needed for moderate pain. 10/25/14 10/23/15 Yes Lucious Groves, DO  travoprost, benzalkonium, (TRAVATAN) 0.004 % ophthalmic solution Place 1  drop into both eyes at bedtime.   Yes Historical Provider, MD  alendronate (FOSAMAX) 70 MG tablet Take 1 tablet (70 mg total) by mouth every 7 (seven) days. Take with a full glass of water on an empty stomach. Takes on Tues. 08/28/14   Lucious Groves, DO  glucose blood (ACCU-CHEK AVIVA) test strip Use as instructed 03/23/14   Lucious Groves, DO  Insulin Syringe-Needle U-100 (BD INSULIN SYRINGE ULTRAFINE) 31G X 5/16" 0.5 ML MISC 1 Units by Does not apply route as directed.  08/28/14   Lucious Groves, DO  ipratropium (ATROVENT HFA) 17 MCG/ACT inhaler inhale 1 to 2 puffs every 6 hours if needed 09/25/14   Lucious Groves, DO  Na Sulfate-K Sulfate-Mg Sulf SOLN Take 1 kit by mouth once. 08/03/14   Irene Shipper, MD  silver sulfADIAZINE (SILVADENE) 1 % cream Apply 1 application topically daily. 07/02/14   Richard Joelene Millin, DPM  sitaGLIPtin (JANUVIA) 50 MG tablet Take 1 tablet (50 mg total) by mouth daily. 03/30/14   Lucious Groves, DO   BP 156/58 mmHg  Pulse 68  Temp(Src) 98.7 F (37.1 C) (Oral)  Resp 18  SpO2 99% Physical Exam  Constitutional: She is oriented to person, place, and time. She appears well-developed and well-nourished. No distress.  HENT:  Head: Normocephalic and atraumatic.  Eyes: Conjunctivae are normal. No scleral icterus.  Neck: Neck supple.  Cardiovascular: Normal rate and intact distal pulses.   Pulmonary/Chest: Effort normal. No stridor. No respiratory distress.  Abdominal: Normal appearance. She exhibits no distension.  Genitourinary: Uterus is not tender. Cervix exhibits no motion tenderness, no discharge and no friability. Right adnexum displays no tenderness. Left adnexum displays no tenderness. There is bleeding (small amount of dark blood in vault) in the vagina. No vaginal discharge found.  Neurological: She is alert and oriented to person, place, and time.  Skin: Skin is warm and dry. No rash noted.  Psychiatric: She has a normal mood and affect. Her behavior is normal.  Nursing note and vitals reviewed.   ED Course  Procedures (including critical care time) Labs Review Labs Reviewed  WET PREP, GENITAL - Abnormal; Notable for the following:    Clue Cells Wet Prep HPF POC FEW (*)    All other components within normal limits  GC/CHLAMYDIA PROBE AMP ([place]) NOT AT [facility]    Imaging Review No results found. I have personally reviewed and evaluated these images and lab results as part of my medical decision-making.   EKG  Interpretation None      MDM   Final diagnoses:  Abnormal uterine bleeding    Vaginal bleeding started today.  No pain.  Not sexually active.  Advised gyn follow up,.      Serita Grit, MD 10/28/14 [phone]

## 2014-10-26 NOTE — Discharge Instructions (Signed)
Abnormal Uterine Bleeding Abnormal uterine bleeding can affect women at various stages in life, including teenagers, women in their reproductive years, pregnant women, and women who have reached menopause. Several kinds of uterine bleeding are considered abnormal, including:  Bleeding or spotting between periods.   Bleeding after sexual intercourse.   Bleeding that is heavier or more than normal.   Periods that last longer than usual.  Bleeding after menopause.  Many cases of abnormal uterine bleeding are minor and simple to treat, while others are more serious. Any type of abnormal bleeding should be evaluated by your health care provider. Treatment will depend on the cause of the bleeding. HOME CARE INSTRUCTIONS Monitor your condition for any changes. The following actions may help to alleviate any discomfort you are experiencing:  Avoid the use of tampons and douches as directed by your health care provider.  Change your pads frequently. You should get regular pelvic exams and Pap tests. Keep all follow-up appointments for diagnostic tests as directed by your health care provider.  SEEK MEDICAL CARE IF:   Your bleeding lasts more than 1 week.   You feel dizzy at times.  SEEK IMMEDIATE MEDICAL CARE IF:   You pass out.   You are changing pads every 15 to 30 minutes.   You have abdominal pain.  You have a fever.   You become sweaty or weak.   You are passing large blood clots from the vagina.   You start to feel nauseous and vomit. MAKE SURE YOU:   Understand these instructions.  Will watch your condition.  Will get help right away if you are not doing well or get worse. Document Released: 02/09/2005 Document Revised: 02/14/2013 Document Reviewed: 09/08/2012 [employer] Patient Information 2015 [employer]. This information is not intended to replace advice given to you by your health care provider. Make sure you discuss any questions you have with your  health care provider.  

## 2014-10-26 NOTE — ED Notes (Signed)
Pt reports mild vaginal bleeding that started today, only pain she reports is slight left side pain. No acute distress noted.

## 2014-10-26 NOTE — ED Notes (Signed)
GC/ Chlamydia discontinued per EDP

## 2014-10-30 ENCOUNTER — Encounter: Payer: Self-pay | Admitting: Podiatry

## 2014-10-30 ENCOUNTER — Ambulatory Visit (INDEPENDENT_AMBULATORY_CARE_PROVIDER_SITE_OTHER): Payer: Medicaid Other | Admitting: Podiatry

## 2014-10-30 VITALS — BP 148/69 | HR 78 | Temp 98.7°F | Resp 12

## 2014-10-30 DIAGNOSIS — L84 Corns and callosities: Secondary | ICD-10-CM

## 2014-10-30 DIAGNOSIS — B351 Tinea unguium: Secondary | ICD-10-CM | POA: Diagnosis not present

## 2014-10-30 DIAGNOSIS — L97501 Non-pressure chronic ulcer of other part of unspecified foot limited to breakdown of skin: Secondary | ICD-10-CM

## 2014-10-30 DIAGNOSIS — M79676 Pain in unspecified toe(s): Secondary | ICD-10-CM

## 2014-10-30 DIAGNOSIS — E1142 Type 2 diabetes mellitus with diabetic polyneuropathy: Secondary | ICD-10-CM

## 2014-10-30 NOTE — Progress Notes (Signed)
Patient ID: [name], female   DOB: 01/16/1951, 63 y.o.   MRN: [id number]  Subjective: This patient presents today complaining of very painful toenails on the right and left feet request nail debridement. Also, patient is complaining of painful plantar calluses and/or pre-ulcerative calluses on the plantar aspect right and left feet. She has been seen over long period of time for debridement of skin and nails at approximately 2 week intervals. Patient missed multiple appointments and her last visit in our office was on 08/28/2014  Objective: The toenails are incurvated, hypertrophic, discolored and tender direct palpation 6-10 Bleeding callus plantar right first and fifth MPJ with beginning superficial breakdown after debridement Bleeding callus first and fifth left MPJ with superficial breakdown after debridement Nucleated plantar keratoses left heel  Assessment: Pre-ulcerative calluses and beginning superficial ulcerations 4 Symptomatic onychomycoses 6-10 Diabetic with peripheral neuropathy  Plan: Debridement toenails 10 mechanically an electrician without a bleeding Debride pre-ulcerative calluses right and left without any active drainage noted and beginning breakdown of the underlying tissues Patient advised to apply Silvadene to the any the areas on the plantar aspect redness feet if she noticed any drainage Wear existing diabetic shoes  Reappoint 3 weeks

## 2014-10-30 NOTE — Patient Instructions (Signed)
Apply Silvadene cream to any draining areas on the bottom right and left feet daily and cover with gauze Diabetes and Foot Care Diabetes may cause you to have problems because of poor blood supply (circulation) to your feet and legs. This may cause the skin on your feet to become thinner, break easier, and heal more slowly. Your skin may become dry, and the skin may peel and crack. You may also have nerve damage in your legs and feet causing decreased feeling in them. You may not notice minor injuries to your feet that could lead to infections or more serious problems. Taking care of your feet is one of the most important things you can do for yourself.  HOME CARE INSTRUCTIONS  Wear shoes at all times, even in the house. Do not go barefoot. Bare feet are easily injured.  Check your feet daily for blisters, cuts, and redness. If you cannot see the bottom of your feet, use a mirror or ask someone for help.  Wash your feet with warm water (do not use hot water) and mild soap. Then pat your feet and the areas between your toes until they are completely dry. Do not soak your feet as this can dry your skin.  Apply a moisturizing lotion or petroleum jelly (that does not contain alcohol and is unscented) to the skin on your feet and to dry, brittle toenails. Do not apply lotion between your toes.  Trim your toenails straight across. Do not dig under them or around the cuticle. File the edges of your nails with an emery board or nail file.  Do not cut corns or calluses or try to remove them with medicine.  Wear clean socks or stockings every day. Make sure they are not too tight. Do not wear knee-high stockings since they may decrease blood flow to your legs.  Wear shoes that fit properly and have enough cushioning. To break in new shoes, wear them for just a few hours a day. This prevents you from injuring your feet. Always look in your shoes before you put them on to be sure there are no objects  inside.  Do not cross your legs. This may decrease the blood flow to your feet.  If you find a minor scrape, cut, or break in the skin on your feet, keep it and the skin around it clean and dry. These areas may be cleansed with mild soap and water. Do not cleanse the area with peroxide, alcohol, or iodine.  When you remove an adhesive bandage, be sure not to damage the skin around it.  If you have a wound, look at it several times a day to make sure it is healing.  Do not use heating pads or hot water bottles. They may burn your skin. If you have lost feeling in your feet or legs, you may not know it is happening until it is too late.  Make sure your health care provider performs a complete foot exam at least annually or more often if you have foot problems. Report any cuts, sores, or bruises to your health care provider immediately. SEEK MEDICAL CARE IF:   You have an injury that is not healing.  You have cuts or breaks in the skin.  You have an ingrown nail.  You notice redness on your legs or feet.  You feel burning or tingling in your legs or feet.  You have pain or cramps in your legs and feet.  Your legs or feet  are numb.  Your feet always feel cold. SEEK IMMEDIATE MEDICAL CARE IF:   There is increasing redness, swelling, or pain in or around a wound.  There is a red line that goes up your leg.  Pus is coming from a wound.  You develop a fever or as directed by your health care provider.  You notice a bad smell coming from an ulcer or wound. Document Released: 02/07/2000 Document Revised: 10/12/2012 Document Reviewed: 07/19/2012 [facility] Patient Information 2015 [place], [place]. This information is not intended to replace advice given to you by your health care provider. Make sure you discuss any questions you have with your health care provider.

## 2014-11-01 ENCOUNTER — Encounter: Payer: Medicaid Other | Admitting: Internal Medicine

## 2014-11-03 ENCOUNTER — Encounter (HOSPITAL_COMMUNITY): Payer: Self-pay | Admitting: *Deleted

## 2014-11-03 ENCOUNTER — Emergency Department (HOSPITAL_COMMUNITY)
Admission: EM | Admit: 2014-11-03 | Discharge: 2014-11-03 | Disposition: A | Discharge status: Home / Self Care | Payer: Medicaid Other | Attending: Emergency Medicine | Admitting: Emergency Medicine

## 2014-11-03 ENCOUNTER — Emergency Department (HOSPITAL_COMMUNITY): Payer: Medicaid Other

## 2014-11-03 DIAGNOSIS — R109 Unspecified abdominal pain: Secondary | ICD-10-CM | POA: Diagnosis present

## 2014-11-03 DIAGNOSIS — E1149 Type 2 diabetes mellitus with other diabetic neurological complication: Secondary | ICD-10-CM | POA: Insufficient documentation

## 2014-11-03 DIAGNOSIS — Z8719 Personal history of other diseases of the digestive system: Secondary | ICD-10-CM | POA: Diagnosis not present

## 2014-11-03 DIAGNOSIS — R011 Cardiac murmur, unspecified: Secondary | ICD-10-CM | POA: Insufficient documentation

## 2014-11-03 DIAGNOSIS — M1990 Unspecified osteoarthritis, unspecified site: Secondary | ICD-10-CM | POA: Diagnosis not present

## 2014-11-03 DIAGNOSIS — I129 Hypertensive chronic kidney disease with stage 1 through stage 4 chronic kidney disease, or unspecified chronic kidney disease: Secondary | ICD-10-CM | POA: Diagnosis not present

## 2014-11-03 DIAGNOSIS — N189 Chronic kidney disease, unspecified: Secondary | ICD-10-CM | POA: Insufficient documentation

## 2014-11-03 DIAGNOSIS — N1330 Unspecified hydronephrosis: Secondary | ICD-10-CM | POA: Diagnosis not present

## 2014-11-03 DIAGNOSIS — E1139 Type 2 diabetes mellitus with other diabetic ophthalmic complication: Secondary | ICD-10-CM | POA: Insufficient documentation

## 2014-11-03 DIAGNOSIS — Z794 Long term (current) use of insulin: Secondary | ICD-10-CM | POA: Diagnosis not present

## 2014-11-03 DIAGNOSIS — Z79899 Other long term (current) drug therapy: Secondary | ICD-10-CM | POA: Insufficient documentation

## 2014-11-03 DIAGNOSIS — Z720 Tobacco use: Secondary | ICD-10-CM | POA: Insufficient documentation

## 2014-11-03 DIAGNOSIS — Z862 Personal history of diseases of the blood and blood-forming organs and certain disorders involving the immune mechanism: Secondary | ICD-10-CM | POA: Diagnosis not present

## 2014-11-03 LAB — URINALYSIS, ROUTINE W REFLEX MICROSCOPIC
BILIRUBIN URINE: NEGATIVE
Glucose, UA: NEGATIVE mg/dL
Ketones, ur: NEGATIVE mg/dL
Leukocytes, UA: NEGATIVE
Nitrite: NEGATIVE
PROTEIN: 100 mg/dL — AB
SPECIFIC GRAVITY, URINE: 1.013 (ref 1.005–1.030)
UROBILINOGEN UA: 0.2 mg/dL (ref 0.0–1.0)
pH: 5.5 (ref 5.0–8.0)

## 2014-11-03 LAB — CBC WITH DIFFERENTIAL/PLATELET
Basophils Absolute: 0 10*3/uL (ref 0.0–0.1)
Basophils Relative: 1 % (ref 0–1)
Eosinophils Absolute: 0.1 10*3/uL (ref 0.0–0.7)
Eosinophils Relative: 3 % (ref 0–5)
HEMATOCRIT: 32.5 % — AB (ref 36.0–46.0)
HEMOGLOBIN: 10.7 g/dL — AB (ref 12.0–15.0)
LYMPHS ABS: 1.7 10*3/uL (ref 0.7–4.0)
LYMPHS PCT: 48 % — AB (ref 12–46)
MCH: 29.4 pg (ref 26.0–34.0)
MCHC: 32.9 g/dL (ref 30.0–36.0)
MCV: 89.3 fL (ref 78.0–100.0)
MONOS PCT: 4 % (ref 3–12)
Monocytes Absolute: 0.1 10*3/uL (ref 0.1–1.0)
NEUTROS ABS: 1.5 10*3/uL — AB (ref 1.7–7.7)
NEUTROS PCT: 44 % (ref 43–77)
Platelets: 143 10*3/uL — ABNORMAL LOW (ref 150–400)
RBC: 3.64 MIL/uL — ABNORMAL LOW (ref 3.87–5.11)
RDW: 14.9 % (ref 11.5–15.5)
WBC: 3.4 10*3/uL — ABNORMAL LOW (ref 4.0–10.5)

## 2014-11-03 LAB — URINE MICROSCOPIC-ADD ON

## 2014-11-03 LAB — BASIC METABOLIC PANEL
ANION GAP: 9 (ref 5–15)
BUN: 22 mg/dL — ABNORMAL HIGH (ref 6–20)
CHLORIDE: 102 mmol/L (ref 101–111)
CO2: 25 mmol/L (ref 22–32)
Calcium: 9.9 mg/dL (ref 8.9–10.3)
Creatinine, Ser: 1.14 mg/dL — ABNORMAL HIGH (ref 0.44–1.00)
GFR calc non Af Amer: 50 mL/min — ABNORMAL LOW (ref >60)
GFR, EST AFRICAN AMERICAN: 58 mL/min — AB (ref >60)
Glucose, Bld: 158 mg/dL — ABNORMAL HIGH (ref 65–99)
POTASSIUM: 3.9 mmol/L (ref 3.5–5.1)
Sodium: 136 mmol/L (ref 135–145)

## 2014-11-03 MED ORDER — SODIUM CHLORIDE 0.9 % IV BOLUS (SEPSIS)
1000.0000 mL | Freq: Once | INTRAVENOUS | Status: AC
  Administered 2014-11-03: 1000 mL via INTRAVENOUS

## 2014-11-03 NOTE — ED Notes (Signed)
To us

## 2014-11-03 NOTE — ED Notes (Signed)
Pt reports left flank pain since Tuesday and frequent urination. Denies fever.

## 2014-11-03 NOTE — Discharge Instructions (Signed)

## 2014-11-03 NOTE — ED Provider Notes (Signed)
CSN: [id number]     Arrival date & time 11/03/14  1125 History   First MD Initiated Contact with Patient 11/03/14 1648     Chief Complaint  Patient presents with  . Flank Pain   [name] is a 64 y.o. female with a history of diabetes, hypertension, pancreatitis, chronic renal insufficiency and peripheral arterial disease who presents to the emergency department complaining of left flank pain for the past 5 days which is been constant. She reports also associated frequent urination since yesterday. The patient reports that after she arrived to the emergency department her left flank pain resolved. Patient reports that currently she has no flank pain. Patient reports that she was having constant unwavering pain in her left flank that radiated into her left lower quadrant at times. She reports her only urinary symptom is frequency and denies dysuria or hematuria. Patient reports she has a history of kidney stones back in 2007 but reports "I don't know what they felt like." The patient denies fevers, chills, nausea, vomiting, diarrhea, constipation, hematochezia, hematuria, dysuria, urinary urgency, vaginal bleeding, vaginal discharge, back pain or rashes.  (Consider location/radiation/quality/duration/timing/severity/associated sxs/prior Treatment) HPI  Past Medical History  Diagnosis Date  . Diabetes mellitus   . Hypertension   . ANEMIA, CHRONIC DISEASE NEC 03/15/2006  . DEGENERATIVE JOINT DISEASE 05/06/2007    On going for >5 yrs No imaging to confirm Has tried ultram, mobic, neurontin which did not work Ambulance person tried once worked well    . 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 DIABETIC  RETINOPATHY 03/15/2006  . Hyperlipidemia 04/01/2011  . PANCREATITIS, CHRONIC 03/29/2006  . RENAL INSUFFICIENCY, CHRONIC 03/15/2006  . Peripheral arterial disease   . Hemorrhoids    Past Surgical History  Procedure Laterality Date  . Breast surgery    . Mouth surgery N/A    History reviewed. No  pertinent family history. Social History  Substance Use Topics  . Smoking status: Current Some Day Smoker -- 0.20 packs/day    Types: Cigarettes  . Smokeless tobacco: Never Used     Comment: 3 -4 cigs  . Alcohol Use: No   OB History    No data available     Review of Systems  Constitutional: Negative for fever and chills.  HENT: Negative for congestion and sore throat.   Eyes: Negative for visual disturbance.  Respiratory: Negative for cough and shortness of breath.   Cardiovascular: Negative for chest pain and palpitations.  Gastrointestinal: Negative for nausea, vomiting, abdominal pain and diarrhea.  Genitourinary: Positive for frequency and flank pain. Negative for dysuria, urgency, hematuria, decreased urine volume, vaginal bleeding, vaginal discharge and difficulty urinating.  Musculoskeletal: Negative for back pain and neck pain.  Skin: Negative for rash.  Neurological: Negative for light-headedness and headaches.      Allergies  Review of patient's allergies indicates no known allergies.  Home Medications   Prior to Admission medications   Medication Sig Start Date End Date Taking? Authorizing Provider  acetaminophen (TYLENOL) 500 MG tablet Take 1,000 mg by mouth every 6 (six) hours as needed for moderate pain.   Yes Historical Provider, MD  albuterol (PROVENTIL HFA) 108 (90 BASE) MCG/ACT inhaler Inhale 2 puffs into the lungs every 6 (six) hours as needed for wheezing. 12/22/13 12/22/14 Yes Lucious Groves, DO  amitriptyline (ELAVIL) 25 MG tablet Take 1 tablet (25 mg total) by mouth at bedtime. 09/25/14  Yes Lucious Groves, DO  insulin glargine (LANTUS) 100 UNIT/ML injection Inject 15 Units into  the skin every morning.    Yes Historical Provider, MD  ipratropium (ATROVENT HFA) 17 MCG/ACT inhaler inhale 1 to 2 puffs every 6 hours if needed 09/25/14  Yes Lucious Groves, DO  Pancrelipase, Lip-Prot-Amyl, 24000 UNITS CPEP Take 3 tablets With meals and 2 tablets with snacks. No  more than 10/24hours 07/31/14  Yes Lucious Groves, DO  polyethylene glycol (MIRALAX / GLYCOLAX) packet Take 17 g by mouth daily as needed for mild constipation. 09/05/14  Yes Lucious Groves, DO  promethazine (PHENERGAN) 12.5 MG tablet Take 1 tablet (12.5 mg total) by mouth every 6 (six) hours as needed for nausea or vomiting. 06/01/14  Yes Lucious Groves, DO  silver sulfADIAZINE (SILVADENE) 1 % cream Apply 1 application topically daily. Patient taking differently: Apply 1 application topically daily as needed (for wound).  07/02/14  Yes Richard Joelene Millin, DPM  traMADol (ULTRAM) 50 MG tablet Take 1 tablet (50 mg total) by mouth every 8 (eight) hours as needed for moderate pain. 10/25/14 10/23/15 Yes Lucious Groves, DO  travoprost, benzalkonium, (TRAVATAN) 0.004 % ophthalmic solution Place 1 drop into both eyes at bedtime.   Yes Historical Provider, MD  alendronate (FOSAMAX) 70 MG tablet Take 1 tablet (70 mg total) by mouth every 7 (seven) days. Take with a full glass of water on an empty stomach. Takes on Tues. 08/28/14   Lucious Groves, DO  glucose blood (ACCU-CHEK AVIVA) test strip Use as instructed 03/23/14   Lucious Groves, DO  Insulin Syringe-Needle U-100 (BD INSULIN SYRINGE ULTRAFINE) 31G X 5/16" 0.5 ML MISC 1 Units by Does not apply route as directed. 08/28/14   Lucious Groves, DO  Na Sulfate-K Sulfate-Mg Sulf SOLN Take 1 kit by mouth once. 08/03/14   Irene Shipper, MD  sitaGLIPtin (JANUVIA) 50 MG tablet Take 1 tablet (50 mg total) by mouth daily. 03/30/14   Lucious Groves, DO   BP 194/73 mmHg  Pulse 77  Temp(Src) 98 F (36.7 C) (Oral)  Resp 12  Ht 5' 5" (1.651 m)  Wt 144 lb 1.6 oz (65.363 kg)  BMI 23.98 kg/m2  SpO2 98% Physical Exam  Constitutional: She is oriented to person, place, and time. She appears well-developed and well-nourished. No distress.  Nontoxic appearing.  HENT:  Head: Normocephalic and atraumatic.  Mouth/Throat: Oropharynx is clear and moist.  Eyes: Conjunctivae are normal. Pupils  are equal, round, and reactive to light. Right eye exhibits no discharge. Left eye exhibits no discharge.  Neck: Neck supple.  Cardiovascular: Normal rate, regular rhythm and intact distal pulses.  Exam reveals no gallop and no friction rub.   Murmur heard. Pulmonary/Chest: Effort normal and breath sounds normal. No respiratory distress. She has no wheezes. She has no rales.  Lungs are clear to auscultation bilaterally.  Abdominal: Soft. Bowel sounds are normal. She exhibits no distension. There is no tenderness. There is no rebound and no guarding.  Abdomen is soft and nontender to palpation. No flank tenderness. No CVA tenderness. No rashes to her abdomen.  Lymphadenopathy:    She has no cervical adenopathy.  Neurological: She is alert and oriented to person, place, and time. Coordination normal.  Skin: Skin is warm and dry. No rash noted. She is not diaphoretic. No erythema. No pallor.  Psychiatric: She has a normal mood and affect. Her behavior is normal.  Nursing note and vitals reviewed.   ED Course  Procedures (including critical care time) Labs Review Labs Reviewed  URINALYSIS, ROUTINE  W REFLEX MICROSCOPIC (NOT AT [facility]) - Abnormal; Notable for the following:    APPearance CLOUDY (*)    Hgb urine dipstick SMALL (*)    Protein, ur 100 (*)    All other components within normal limits  URINE MICROSCOPIC-ADD ON - Abnormal; Notable for the following:    Squamous Epithelial / LPF FEW (*)    All other components within normal limits  BASIC METABOLIC PANEL - Abnormal; Notable for the following:    Glucose, Bld 158 (*)    BUN 22 (*)    Creatinine, Ser 1.14 (*)    GFR calc non Af Amer 50 (*)    GFR calc Af Amer 58 (*)    All other components within normal limits  CBC WITH DIFFERENTIAL/PLATELET - Abnormal; Notable for the following:    WBC 3.4 (*)    RBC 3.64 (*)    Hemoglobin 10.7 (*)    HCT 32.5 (*)    Platelets 143 (*)    Neutro Abs 1.5 (*)    Lymphocytes Relative 48 (*)     All other components within normal limits    Imaging Review US Renal  11/03/2014   CLINICAL DATA:  Left flank pain for 5 days.  EXAM: RENAL / URINARY TRACT ULTRASOUND COMPLETE  COMPARISON:  CT abdomen and pelvis 05/28/2014  FINDINGS: Right Kidney:  Length: 11.1 cm. Echogenicity within normal limits. No mass or hydronephrosis visualized.  Left Kidney:  Length: 10.9 cm. Echogenicity within normal limits. No mass or gross stone visualized. Mild hydronephrosis.  Bladder:  Appears normal for degree of bladder distention. Left ureteral jet visualized.  IMPRESSION: 1. Mild left hydronephrosis. 2. Left ureteral jet visualized, suggestive of incomplete obstruction.   Electronically Signed   By: Logan Bores M.D.   On: 11/03/2014 19:15   I have personally reviewed and evaluated these images and lab results as part of my medical decision-making.   EKG Interpretation None      Filed Vitals:   11/03/14 1745 11/03/14 1800 11/03/14 1910 11/03/14 1943  BP: 206/71 189/65 193/70 194/73  Pulse: 78 69 67 77  Temp:   98 F (36.7 C)   TempSrc:   Oral   Resp:   16 12  Height:      Weight:      SpO2: 100% 100% 100% 98%     MDM   Meds given in ED:  Medications  sodium chloride 0.9 % bolus 1,000 mL (1,000 mLs Intravenous New Bag/Given 11/03/14 1736)    New Prescriptions   No medications on file    Final diagnoses:  Left flank pain  Hydronephrosis, left   This is a 64 year old female with a history of renal insufficiency who presents to the emergency department complaining of left flank pain for the past 5 days. The patient reports that after arrival to the emergency department her left flank pain resolved. During her emergency department stay she denied any pain. On exam the patient is afebrile nontoxic appearing. Her abdomen is soft and nontender to palpation. She has no CVA or flank tenderness. BMP indicates a creatinine of 1.14 which is an improvement from a creatinine of 2.27 from two months ago.  CBC shows stable hemoglobin and hematocrit. No leukocytosis. Urinalysis indicates small hemoglobin and is nitrite and leukocyte negative. Renal ultrasound indicated mild left hydronephrosis. The left ureteral jet was visualized, suggestive of an incomplete obstruction. I suspect the patient had a kidney stone as she has had in the past. At reevaluation  the patient reports she has been pain-free during her ER stay. Patient's creatinine is an improvement from her previous. As the patient has not had kidney stones a long time we'll have her follow-up with urology and her primary care provider. I encouraged her to stay well-hydrated and provide education on dietary guidelines to prevent kidney stones. I advised the patient to follow-up with their primary care provider this week. I advised the patient to return to the emergency department with new or worsening symptoms or new concerns. The patient verbalized understanding and agreement with plan.    This patient was discussed with Dr. Tomi Bamberger who agrees with assessment and plan.    Waynetta Pean, PA-C 11/03/14 2045  Dorie Rank, MD 11/03/14 2114

## 2014-11-03 NOTE — ED Notes (Signed)
The pt is c/o lt flank pain no n v or d hx of kidney stones

## 2014-11-05 ENCOUNTER — Encounter (HOSPITAL_COMMUNITY): Payer: Self-pay | Admitting: *Deleted

## 2014-11-05 ENCOUNTER — Emergency Department (HOSPITAL_COMMUNITY)
Admission: EM | Admit: 2014-11-05 | Discharge: 2014-11-06 | Disposition: A | Discharge status: Home / Self Care | Payer: Medicaid Other | Attending: Emergency Medicine | Admitting: Emergency Medicine

## 2014-11-05 DIAGNOSIS — D649 Anemia, unspecified: Secondary | ICD-10-CM | POA: Diagnosis not present

## 2014-11-05 DIAGNOSIS — Z79899 Other long term (current) drug therapy: Secondary | ICD-10-CM | POA: Insufficient documentation

## 2014-11-05 DIAGNOSIS — Z8719 Personal history of other diseases of the digestive system: Secondary | ICD-10-CM | POA: Insufficient documentation

## 2014-11-05 DIAGNOSIS — Z87448 Personal history of other diseases of urinary system: Secondary | ICD-10-CM | POA: Insufficient documentation

## 2014-11-05 DIAGNOSIS — N200 Calculus of kidney: Secondary | ICD-10-CM | POA: Diagnosis not present

## 2014-11-05 DIAGNOSIS — E11319 Type 2 diabetes mellitus with unspecified diabetic retinopathy without macular edema: Secondary | ICD-10-CM | POA: Insufficient documentation

## 2014-11-05 DIAGNOSIS — Z8679 Personal history of other diseases of the circulatory system: Secondary | ICD-10-CM | POA: Insufficient documentation

## 2014-11-05 DIAGNOSIS — E119 Type 2 diabetes mellitus without complications: Secondary | ICD-10-CM | POA: Insufficient documentation

## 2014-11-05 DIAGNOSIS — I10 Essential (primary) hypertension: Secondary | ICD-10-CM | POA: Diagnosis not present

## 2014-11-05 DIAGNOSIS — Z794 Long term (current) use of insulin: Secondary | ICD-10-CM | POA: Insufficient documentation

## 2014-11-05 DIAGNOSIS — Z720 Tobacco use: Secondary | ICD-10-CM | POA: Diagnosis not present

## 2014-11-05 DIAGNOSIS — E1140 Type 2 diabetes mellitus with diabetic neuropathy, unspecified: Secondary | ICD-10-CM | POA: Insufficient documentation

## 2014-11-05 DIAGNOSIS — R040 Epistaxis: Secondary | ICD-10-CM | POA: Diagnosis not present

## 2014-11-05 DIAGNOSIS — R109 Unspecified abdominal pain: Secondary | ICD-10-CM

## 2014-11-05 MED ORDER — FENTANYL CITRATE (PF) 100 MCG/2ML IJ SOLN
50.0000 ug | Freq: Once | INTRAMUSCULAR | Status: AC
  Administered 2014-11-05: 50 ug via INTRAVENOUS
  Filled 2014-11-05: qty 2

## 2014-11-05 MED ORDER — ONDANSETRON HCL 4 MG/2ML IJ SOLN
4.0000 mg | Freq: Once | INTRAMUSCULAR | Status: AC
  Administered 2014-11-05: 4 mg via INTRAVENOUS
  Filled 2014-11-05: qty 2

## 2014-11-05 NOTE — ED Notes (Signed)
Pt reports intermittent nosebleeds, left rib pain and HTN. Was recently taken off bp meds. BP 162/73 at triage. Denies urinary symptoms or cough.

## 2014-11-05 NOTE — ED Notes (Signed)
Patient is resting comfortably. 

## 2014-11-05 NOTE — ED Provider Notes (Signed)
This chart was scribed for  [place], DO by Altamease Oiler, ED Scribe. This patient was seen in room D36C/D36C and the patient's care was started at 11:35 PM.  TIME SEEN: 11:35 PM  CHIEF COMPLAINT: epistaxis  HPI: [name] is a 64 y.o. female with PMHx of HTN, DM, HLD who presents to the Emergency Department complaining of intermittent left-sided epistaxis with onset last night 10 pm. The bleeding stopped around 4 PM today. Also complains of atraumatic and intermittent left rib pain for 2 weeks. Pt describes the pain as tightness with no modifying factors. Tramadol provided insufficient pain relief at home. She was diagnosed with kidney stones 3 days ago but was not discharged with any pain medication. Associated symptoms include nausea. Pt denies fever, cough, SOB, vomiting, diarrhea, dysuria, and hematuria. She was recently taken off her Losartan because her blood pressure had improved but she notes that she resumed it yesterday because her blood pressure had been running high at home. No history of DVT/PE, recent surgery or hospitalization, recent fracture, recent travel, or  hormonal therapy. Tobacco+. She has a scheduled PCP appointment on 11/29/14 and is unable to schedule one for sooner because her doctor is out of town.   ROS: See HPI Constitutional: no fever  Eyes: no drainage  ENT: no runny nose   Cardiovascular:  Left lateral chest pain  Resp: no SOB  GI: no vomiting GU: no dysuria Integumentary: no rash  Allergy: no hives  Musculoskeletal: no leg swelling  Neurological: no slurred speech ROS otherwise negative  PAST MEDICAL HISTORY/PAST SURGICAL HISTORY:  Past Medical History  Diagnosis Date  . Diabetes mellitus   . Hypertension   . ANEMIA, CHRONIC DISEASE NEC 03/15/2006  . DEGENERATIVE JOINT DISEASE 05/06/2007    On going for >5 yrs No imaging to confirm Has tried ultram, mobic, neurontin which did not work Ambulance person tried once worked well    . DIABETIC PERIPHERAL  NEUROPATHY 03/15/2006  . DIABETIC  RETINOPATHY 03/15/2006  . Hyperlipidemia 04/01/2011  . PANCREATITIS, CHRONIC 03/29/2006  . RENAL INSUFFICIENCY, CHRONIC 03/15/2006  . Peripheral arterial disease   . Hemorrhoids     MEDICATIONS:  Prior to Admission medications   Medication Sig Start Date End Date Taking? Authorizing Provider  albuterol (PROVENTIL HFA) 108 (90 BASE) MCG/ACT inhaler Inhale 2 puffs into the lungs every 6 (six) hours as needed for wheezing. 12/22/13 12/22/14 Yes Lucious Groves, DO  amitriptyline (ELAVIL) 25 MG tablet Take 1 tablet (25 mg total) by mouth at bedtime. 09/25/14  Yes Lucious Groves, DO  insulin glargine (LANTUS) 100 UNIT/ML injection Inject 15 Units into the skin every morning.    Yes Historical Provider, MD  Pancrelipase, Lip-Prot-Amyl, 24000 UNITS CPEP Take 3 tablets With meals and 2 tablets with snacks. No more than 10/24hours Patient taking differently: Take 48,000-72,000 Units by mouth See admin instructions. Take 3 tablets With meals and 2 tablets with snacks. No more than 10/24hours 07/31/14  Yes Lucious Groves, DO  polyethylene glycol (MIRALAX / GLYCOLAX) packet Take 17 g by mouth daily as needed for mild constipation. 09/05/14  Yes Lucious Groves, DO  promethazine (PHENERGAN) 12.5 MG tablet Take 1 tablet (12.5 mg total) by mouth every 6 (six) hours as needed for nausea or vomiting. 06/01/14  Yes Lucious Groves, DO  silver sulfADIAZINE (SILVADENE) 1 % cream Apply 1 application topically daily. Patient taking differently: Apply 1 application topically daily as needed (for wound).  07/02/14  Yes Richard  C Tuchman, DPM  traMADol (ULTRAM) 50 MG tablet Take 1 tablet (50 mg total) by mouth every 8 (eight) hours as needed for moderate pain. 10/25/14 10/23/15 Yes Lucious Groves, DO  travoprost, benzalkonium, (TRAVATAN) 0.004 % ophthalmic solution Place 1 drop into both eyes at bedtime.   Yes Historical Provider, MD  acetaminophen (TYLENOL) 500 MG tablet Take 1,000 mg by mouth every 6  (six) hours as needed for moderate pain.    Historical Provider, MD  alendronate (FOSAMAX) 70 MG tablet Take 1 tablet (70 mg total) by mouth every 7 (seven) days. Take with a full glass of water on an empty stomach. Takes on Tues. Patient not taking: Reported on 11/05/2014 08/28/14   Lucious Groves, DO  ipratropium (ATROVENT HFA) 17 MCG/ACT inhaler inhale 1 to 2 puffs every 6 hours if needed Patient not taking: Reported on 11/05/2014 09/25/14   Lucious Groves, DO  sitaGLIPtin (JANUVIA) 50 MG tablet Take 1 tablet (50 mg total) by mouth daily. Patient not taking: Reported on 11/05/2014 03/30/14   Lucious Groves, DO    ALLERGIES:  No Known Allergies  SOCIAL HISTORY:  Social History  Substance Use Topics  . Smoking status: Current Some Day Smoker -- 0.20 packs/day    Types: Cigarettes  . Smokeless tobacco: Never Used     Comment: 3 -4 cigs  . Alcohol Use: No    FAMILY HISTORY: History reviewed. No pertinent family history.  EXAM: BP 175/78 mmHg  Pulse 61  Temp(Src) 98.5 F (36.9 C) (Oral)  Resp 12  Ht 5' 5.5" (1.664 m)  Wt 144 lb (65.318 kg)  BMI 23.59 kg/m2  SpO2 100% CONSTITUTIONAL: Alert and oriented and responds appropriately to questions. Well-appearing; well-nourished HEAD: Normocephalic EYES: Conjunctivae clear, PERRL ENT: normal nose; no rhinorrhea; moist mucous membranes; pharynx without lesions noted NECK: Supple, no meningismus, no LAD  CARD: RRR; S1 and S2 appreciated; no murmurs, no clicks, no rubs, no gallops, chest wall non tender, no crepitus, ecchymosis, or deformity RESP: Normal chest excursion without splinting or tachypnea; breath sounds clear and equal bilaterally; no wheezes, no rhonchi, no rales, no hypoxia or respiratory distress, speaking full sentences ABD/GI: Normal bowel sounds; non-distended; soft, non-tender, no rebound, no guarding, no peritoneal signs BACK:  The back appears normal and is non-tender to palpation, there is left sided CVA tenderness, no  midline spinal tenderness, no step-off, no deformity EXT: Normal ROM in all joints; non-tender to palpation; no edema; normal capillary refill; no cyanosis, no calf tenderness or swelling    SKIN: Normal color for age and race; warm NEURO: Moves all extremities equally, sensation to light touch intact diffusely, cranial nerves II through XII intact PSYCH: The patient's mood and manner are appropriate. Grooming and personal hygiene are appropriate.  MEDICAL DECISION MAKING: Patient here with left lateral chest pain and left flank pain. Recently had an ultrasound which showed possible incomplete obstruction and hydronephrosis. Urine at that time showed hematuria but no other sign of infection. Concern for kidney stone. Patient has had persistent pain but describes this as a tightness. She does have risk factors for ACS including hypertension, diabetes and tobacco use. No shortness of breath, associated dizziness or diaphoresis however. Will obtain cardiac labs, chest x-ray, EKG. We'll also obtain urinalysis and a CT of her abdomen pelvis for further evaluation. Differential diagnosis includes ACS, pneumonia, less likely pulmonary embolus, kidney stone, pyelonephritis. We'll give fentanyl and Zofran for her pain and nausea.   ED PROGRESS: Patient's labs  are unremarkable. She's had 2 negative troponins. Her urine shows moderate hemoglobin but 0-2 RBCs. She also has proteinuria. No sign of infection. CT of her abdomen and pelvis shows mild bilateral pelviectasis without hydronephrosis or urolithiasis which could be seen with a recently passed kidney stone. Chest x-ray shows no infiltrate, rib fracture or pneumothorax. Pain is been controlled well after only one dose of fentanyl. She states the tramadol that she takes chronically at home is not helping with her pain and that her primary care provider is out of town for the next month. We'll discharge patient with short prescription for Vicodin to use as needed.  Have given her outpatient urology follow-up information.   Patient also had epistaxis that resolved prior to arrival. She has not had any bleeding in the emergency department. Have discussed with her how to stop bleeding in the future and when to come to the emergency department.  She is also mildly hypertensive and this has improved. She has recently restarted her losartan. Have advised her to continue this medication follow-up with her PCP for management of her blood pressure. She is no headache, numbness, tingling, focal weakness, anterior chest pain, shortness of breath. Blood pressure is 147/59 on discharge.  Discussed return precautions. She verbalized understanding and is comfortable with this plan.       EKG Interpretation  Date/Time:  Monday November 05 2014 23:57:42 EDT Ventricular Rate:  62 PR Interval:  178 QRS Duration: 105 QT Interval:  462 QTC Calculation: 469 R Axis:   -21 Text Interpretation:  Sinus rhythm Probable left ventricular hypertrophy Borderline T abnormalities, lateral leads No significant change since last tracing Confirmed by Shenetta Schnackenberg,  DO, Angus Amini ([id number]) on 11/06/2014 12:01:11 AM        I personally performed the services described in this documentation, which was scribed in my presence. The recorded information has been reviewed and is accurate.     [place], DO 11/06/14 [phone]

## 2014-11-06 ENCOUNTER — Emergency Department (HOSPITAL_COMMUNITY): Payer: Medicaid Other

## 2014-11-06 LAB — COMPREHENSIVE METABOLIC PANEL
ALT: 36 U/L (ref 14–54)
ANION GAP: 7 (ref 5–15)
AST: 39 U/L (ref 15–41)
Albumin: 3.4 g/dL — ABNORMAL LOW (ref 3.5–5.0)
Alkaline Phosphatase: 69 U/L (ref 38–126)
BUN: 33 mg/dL — ABNORMAL HIGH (ref 6–20)
CHLORIDE: 107 mmol/L (ref 101–111)
CO2: 24 mmol/L (ref 22–32)
Calcium: 9.3 mg/dL (ref 8.9–10.3)
Creatinine, Ser: 1.3 mg/dL — ABNORMAL HIGH (ref 0.44–1.00)
GFR calc non Af Amer: 43 mL/min — ABNORMAL LOW (ref >60)
GFR, EST AFRICAN AMERICAN: 50 mL/min — AB (ref >60)
Glucose, Bld: 84 mg/dL (ref 65–99)
POTASSIUM: 4.2 mmol/L (ref 3.5–5.1)
SODIUM: 138 mmol/L (ref 135–145)
Total Bilirubin: 0.4 mg/dL (ref 0.3–1.2)
Total Protein: 6.7 g/dL (ref 6.5–8.1)

## 2014-11-06 LAB — URINALYSIS, ROUTINE W REFLEX MICROSCOPIC
Bilirubin Urine: NEGATIVE
GLUCOSE, UA: NEGATIVE mg/dL
Ketones, ur: NEGATIVE mg/dL
LEUKOCYTES UA: NEGATIVE
NITRITE: NEGATIVE
PH: 5 (ref 5.0–8.0)
Protein, ur: 100 mg/dL — AB
SPECIFIC GRAVITY, URINE: 1.014 (ref 1.005–1.030)
Urobilinogen, UA: 0.2 mg/dL (ref 0.0–1.0)

## 2014-11-06 LAB — CBC WITH DIFFERENTIAL/PLATELET
Basophils Absolute: 0 10*3/uL (ref 0.0–0.1)
Basophils Relative: 1 % (ref 0–1)
EOS ABS: 0.1 10*3/uL (ref 0.0–0.7)
EOS PCT: 3 % (ref 0–5)
HCT: 31.2 % — ABNORMAL LOW (ref 36.0–46.0)
Hemoglobin: 10.3 g/dL — ABNORMAL LOW (ref 12.0–15.0)
LYMPHS ABS: 2.2 10*3/uL (ref 0.7–4.0)
Lymphocytes Relative: 56 % — ABNORMAL HIGH (ref 12–46)
MCH: 29.4 pg (ref 26.0–34.0)
MCHC: 33 g/dL (ref 30.0–36.0)
MCV: 89.1 fL (ref 78.0–100.0)
MONO ABS: 0.2 10*3/uL (ref 0.1–1.0)
MONOS PCT: 5 % (ref 3–12)
Neutro Abs: 1.4 10*3/uL — ABNORMAL LOW (ref 1.7–7.7)
Neutrophils Relative %: 35 % — ABNORMAL LOW (ref 43–77)
PLATELETS: 139 10*3/uL — AB (ref 150–400)
RBC: 3.5 MIL/uL — AB (ref 3.87–5.11)
RDW: 15.1 % (ref 11.5–15.5)
WBC: 4 10*3/uL (ref 4.0–10.5)

## 2014-11-06 LAB — I-STAT TROPONIN, ED
TROPONIN I, POC: 0.01 ng/mL (ref 0.00–0.08)
TROPONIN I, POC: 0.01 ng/mL (ref 0.00–0.08)

## 2014-11-06 LAB — URINE MICROSCOPIC-ADD ON

## 2014-11-06 MED ORDER — HYDROCODONE-ACETAMINOPHEN 5-325 MG PO TABS: 1.0000 | ORAL_TABLET | ORAL | Status: DC | PRN

## 2014-11-06 MED ORDER — ONDANSETRON 4 MG PO TBDP: 4.0000 mg | ORAL_TABLET | Freq: Three times a day (TID) | ORAL | Status: DC | PRN

## 2014-11-06 NOTE — Discharge Instructions (Signed)
You were seen in the emergency department for left-sided back pain. Appears you have had a kidney stone that has passed. Please follow-up with your primary care doctor or urology if pain continues. You have no sign of urinary tract infection. Otherwise your CT scan was normal. Labs are otherwise unremarkable with negative cardiac labs. Chest x-ray was clear. We are discharging with prescription for Vicodin to take as needed but do not take this medication with your tramadol.   Your blood pressure was also slightly elevated but has improved during your ER stay. Please continue your losartan as previously prescribed given your having blood pressures of 170s to 180s/70s to 90s. Please follow-up with your primary care provider for this.  You have another nosebleed, I recommend you hold direct pressure without letting go for 20 minutes. If her bleeding does not stop after this, please return to the hospital.   Kidney Stones Kidney stones (urolithiasis) are deposits that form inside your kidneys. The intense pain is caused by the stone moving through the urinary tract. When the stone moves, the ureter goes into spasm around the stone. The stone is usually passed in the urine.  CAUSES   A disorder that makes certain neck glands produce too much parathyroid hormone (primary hyperparathyroidism).  A buildup of uric acid crystals, similar to gout in your joints.  Narrowing (stricture) of the ureter.  A kidney obstruction present at birth (congenital obstruction).  Previous surgery on the kidney or ureters.  Numerous kidney infections. SYMPTOMS   Feeling sick to your stomach (nauseous).  Throwing up (vomiting).  Blood in the urine (hematuria).  Pain that usually spreads (radiates) to the groin.  Frequency or urgency of urination. DIAGNOSIS   Taking a history and physical exam.  Blood or urine tests.  CT scan.  Occasionally, an examination of the inside of the urinary bladder  (cystoscopy) is performed. TREATMENT   Observation.  Increasing your fluid intake.  Extracorporeal shock wave lithotripsy--This is a noninvasive procedure that uses shock waves to break up kidney stones.  Surgery may be needed if you have severe pain or persistent obstruction. There are various surgical procedures. Most of the procedures are performed with the use of small instruments. Only small incisions are needed to accommodate these instruments, so recovery time is minimized. The size, location, and chemical composition are all important variables that will determine the proper choice of action for you. Talk to your health care provider to better understand your situation so that you will minimize the risk of injury to yourself and your kidney.  HOME CARE INSTRUCTIONS   Drink enough water and fluids to keep your urine clear or pale yellow. This will help you to pass the stone or stone fragments.  Strain all urine through the provided strainer. Keep all particulate matter and stones for your health care provider to see. The stone causing the pain may be as small as a grain of salt. It is very important to use the strainer each and every time you pass your urine. The collection of your stone will allow your health care provider to analyze it and verify that a stone has actually passed. The stone analysis will often identify what you can do to reduce the incidence of recurrences.  Only take over-the-counter or prescription medicines for pain, discomfort, or fever as directed by your health care provider.  Make a follow-up appointment with your health care provider as directed.  Get follow-up X-rays if required. The absence of pain does  not always mean that the stone has passed. It may have only stopped moving. If the urine remains completely obstructed, it can cause loss of kidney function or even complete destruction of the kidney. It is your responsibility to make sure X-rays and follow-ups  are completed. Ultrasounds of the kidney can show blockages and the status of the kidney. Ultrasounds are not associated with any radiation and can be performed easily in a matter of minutes. SEEK MEDICAL CARE IF:  You experience pain that is progressive and unresponsive to any pain medicine you have been prescribed. SEEK IMMEDIATE MEDICAL CARE IF:   Pain cannot be controlled with the prescribed medicine.  You have a fever or shaking chills.  The severity or intensity of pain increases over 18 hours and is not relieved by pain medicine.  You develop a new onset of abdominal pain.  You feel faint or pass out.  You are unable to urinate. MAKE SURE YOU:   Understand these instructions.  Will watch your condition.  Will get help right away if you are not doing well or get worse. Document Released: 02/09/2005 Document Revised: 10/12/2012 Document Reviewed: 07/13/2012 [facility] Patient Information 2015 [place], [place]. This information is not intended to replace advice given to you by your health care provider. Make sure you discuss any questions you have with your health care provider.  Dietary Guidelines to Help Prevent Kidney Stones Your risk of kidney stones can be decreased by adjusting the foods you eat. The most important thing you can do is drink enough fluid. You should drink enough fluid to keep your urine clear or pale yellow. The following guidelines provide specific information for the type of kidney stone you have had. GUIDELINES ACCORDING TO TYPE OF KIDNEY STONE Calcium Oxalate Kidney Stones  Reduce the amount of salt you eat. Foods that have a lot of salt cause your body to release excess calcium into your urine. The excess calcium can combine with a substance called oxalate to form kidney stones.  Reduce the amount of animal protein you eat if the amount you eat is excessive. Animal protein causes your body to release excess calcium into your urine. Ask your dietitian how  much protein from animal sources you should be eating.  Avoid foods that are high in oxalates. If you take vitamins, they should have less than 500 mg of vitamin C. Your body turns vitamin C into oxalates. You do not need to avoid fruits and vegetables high in vitamin C. Calcium Phosphate Kidney Stones  Reduce the amount of salt you eat to help prevent the release of excess calcium into your urine.  Reduce the amount of animal protein you eat if the amount you eat is excessive. Animal protein causes your body to release excess calcium into your urine. Ask your dietitian how much protein from animal sources you should be eating.  Get enough calcium from food or take a calcium supplement (ask your dietitian for recommendations). Food sources of calcium that do not increase your risk of kidney stones include:  Broccoli.  Dairy products, such as cheese and yogurt.  Pudding. Uric Acid Kidney Stones  Do not have more than 6 oz of animal protein per day. FOOD SOURCES Animal Protein Sources  Meat (all types).  Poultry.  Eggs.  Fish, seafood. Foods High in [employer] seasonings.  Soy sauce.  Teriyaki sauce.  Cured and processed meats.  Salted crackers and snack foods.  Fast food.  Canned soups and most canned  foods. Foods High in Oxalates  Grains:  Amaranth.  Barley.  Grits.  Wheat germ.  Bran.  Buckwheat flour.  All bran cereals.  Pretzels.  Whole wheat bread.  Vegetables:  Beans (wax).  Beets and beet greens.  Collard greens.  Eggplant.  [place]ole.  Leeks.  Okra.  Parsley.  Rutabagas.  Spinach.  Swiss chard.  Tomato paste.  Fried potatoes.  Sweet potatoes.  Fruits:  Red currants.  Figs.  Kiwi.  Rhubarb.  Meat and Other Protein Sources:  Beans (dried).  Soy burgers and other soybean products.  Miso.  Nuts (peanuts, almonds, pecans, cashews, hazelnuts).  Nut butters.  Sesame seeds and tahini (paste made of  sesame seeds).  Poppy seeds.  Beverages:  Chocolate drink mixes.  Soy milk.  Instant iced tea.  Juices made from high-oxalate fruits or vegetables.  Other:  Carob.  Chocolate.  Fruitcake.  Marmalades. Document Released: 06/06/2010 Document Revised: 02/14/2013 Document Reviewed: 01/06/2013 [facility] Patient Information 2015 [place], [place]. This information is not intended to replace advice given to you by your health care provider. Make sure you discuss any questions you have with your health care provider.    Hypertension Hypertension, commonly called high blood pressure, is when the force of blood pumping through your arteries is too strong. Your arteries are the blood vessels that carry blood from your heart throughout your body. A blood pressure reading consists of a higher number over a lower number, such as 110/72. The higher number (systolic) is the pressure inside your arteries when your heart pumps. The lower number (diastolic) is the pressure inside your arteries when your heart relaxes. Ideally you want your blood pressure below 120/80. Hypertension forces your heart to work harder to pump blood. Your arteries may become narrow or stiff. Having hypertension puts you at risk for heart disease, stroke, and other problems.  RISK FACTORS Some risk factors for high blood pressure are controllable. Others are not.  Risk factors you cannot control include:   Race. You may be at higher risk if you are African American.  Age. Risk increases with age.  Gender. Men are at higher risk than women before age 36 years. After age 3, women are at higher risk than men. Risk factors you can control include:  Not getting enough exercise or physical activity.  Being overweight.  Getting too much fat, sugar, calories, or salt in your diet.  Drinking too much alcohol. SIGNS AND SYMPTOMS Hypertension does not usually cause signs or symptoms. Extremely high blood pressure  (hypertensive crisis) may cause headache, anxiety, shortness of breath, and nosebleed. DIAGNOSIS  To check if you have hypertension, your health care provider will measure your blood pressure while you are seated, with your arm held at the level of your heart. It should be measured at least twice using the same arm. Certain conditions can cause a difference in blood pressure between your right and left arms. A blood pressure reading that is higher than normal on one occasion does not mean that you need treatment. If one blood pressure reading is high, ask your health care provider about having it checked again. TREATMENT  Treating high blood pressure includes making lifestyle changes and possibly taking medicine. Living a healthy lifestyle can help lower high blood pressure. You may need to change some of your habits. Lifestyle changes may include:  Following the DASH diet. This diet is high in fruits, vegetables, and whole grains. It is low in salt, red meat, and added sugars.  Getting at least 2 hours of brisk physical activity every week.  Losing weight if necessary.  Not smoking.  Limiting alcoholic beverages.  Learning ways to reduce stress. If lifestyle changes are not enough to get your blood pressure under control, your health care provider may prescribe medicine. You may need to take more than one. Work closely with your health care provider to understand the risks and benefits. HOME CARE INSTRUCTIONS  Have your blood pressure rechecked as directed by your health care provider.   Take medicines only as directed by your health care provider. Follow the directions carefully. Blood pressure medicines must be taken as prescribed. The medicine does not work as well when you skip doses. Skipping doses also puts you at risk for problems.   Do not smoke.   Monitor your blood pressure at home as directed by your health care provider. SEEK MEDICAL CARE IF:   You think you are  having a reaction to medicines taken.  You have recurrent headaches or feel dizzy.  You have swelling in your ankles.  You have trouble with your vision. SEEK IMMEDIATE MEDICAL CARE IF:  You develop a severe headache or confusion.  You have unusual weakness, numbness, or feel faint.  You have severe chest or abdominal pain.  You vomit repeatedly.  You have trouble breathing. MAKE SURE YOU:   Understand these instructions.  Will watch your condition.  Will get help right away if you are not doing well or get worse. Document Released: 02/09/2005 Document Revised: 06/26/2013 Document Reviewed: 12/02/2012 [facility] Patient Information 2015 [place], [place]. This information is not intended to replace advice given to you by your health care provider. Make sure you discuss any questions you have with your health care provider.  Nosebleed Nosebleeds can be caused by many conditions, including trauma, infections, polyps, foreign bodies, dry mucous membranes or climate, medicines, and air conditioning. Most nosebleeds occur in the front of the nose. Because of this location, most nosebleeds can be controlled by pinching the nostrils gently and continuously for at least 10 to 20 minutes. The long, continuous pressure allows enough time for the blood to clot. If pressure is released during that 10 to 20 minute time period, the process may have to be started again. The nosebleed may stop by itself or quit with pressure, or it may need concentrated heating (cautery) or pressure from packing. HOME CARE INSTRUCTIONS   If your nose was packed, try to maintain the pack inside until your health care provider removes it. If a gauze pack was used and it starts to fall out, gently replace it or cut the end off. Do not cut if a balloon catheter was used to pack the nose. Otherwise, do not remove unless instructed.  Avoid blowing your nose for 12 hours after treatment. This could dislodge the pack or clot  and start the bleeding again.  If the bleeding starts again, sit up and bend forward, gently pinching the front half of your nose continuously for 20 minutes.  If bleeding was caused by dry mucous membranes, use over-the-counter saline nasal spray or gel. This will keep the mucous membranes moist and allow them to heal. If you must use a lubricant, choose the water-soluble variety. Use it only sparingly and not within several hours of lying down.  Do not use petroleum jelly or mineral oil, as these may drip into the lungs and cause serious problems.  Maintain humidity in your home by using less air conditioning or by  using a humidifier.  Do not use aspirin or medicines which make bleeding more likely. Your health care provider can give you recommendations on this.  Resume normal activities as you are able, but try to avoid straining, lifting, or bending at the waist for several days.  If the nosebleeds become recurrent and the cause is unknown, your health care provider may suggest laboratory tests. SEEK MEDICAL CARE IF: You have a fever. SEEK IMMEDIATE MEDICAL CARE IF:   Bleeding recurs and cannot be controlled.  There is unusual bleeding from or bruising on other parts of the body.  Nosebleeds continue.  There is any worsening of the condition which originally brought you in.  You become light-headed, feel faint, become sweaty, or vomit blood. MAKE SURE YOU:   Understand these instructions.  Will watch your condition.  Will get help right away if you are not doing well or get worse. Document Released: 11/19/2004 Document Revised: 06/26/2013 Document Reviewed: 01/10/2009 [facility] Patient Information 2015 [place], [place]. This information is not intended to replace advice given to you by your health care provider. Make sure you discuss any questions you have with your health care provider.

## 2014-11-06 NOTE — ED Notes (Signed)
Pt transported to radiology.

## 2014-11-06 NOTE — ED Notes (Signed)
Pt requesting some coffee. Informed her I would have to ask the MD. Pt verbalized understanding.

## 2014-11-06 NOTE — ED Notes (Signed)
Dr Ward back at the bedside.

## 2014-11-14 ENCOUNTER — Ambulatory Visit: Payer: Medicaid Other | Admitting: Internal Medicine

## 2014-11-14 ENCOUNTER — Encounter: Payer: Self-pay | Admitting: Internal Medicine

## 2014-11-20 ENCOUNTER — Ambulatory Visit: Payer: Medicaid Other | Admitting: Podiatry

## 2014-11-21 ENCOUNTER — Telehealth: Payer: Self-pay | Admitting: *Deleted

## 2014-11-21 ENCOUNTER — Encounter: Payer: Medicaid Other | Admitting: Obstetrics and Gynecology

## 2014-11-21 ENCOUNTER — Encounter: Payer: Self-pay | Admitting: *Deleted

## 2014-11-21 NOTE — Telephone Encounter (Signed)
[name] missed a scheduled appointment to follow up abnormal vaginal bleeding. Called [name] and unable to leave message as no one answered, no voicemail answered. Will send letter.

## 2014-11-28 ENCOUNTER — Ambulatory Visit (INDEPENDENT_AMBULATORY_CARE_PROVIDER_SITE_OTHER): Payer: Medicaid Other | Admitting: Podiatry

## 2014-11-28 ENCOUNTER — Encounter: Payer: Self-pay | Admitting: Podiatry

## 2014-11-28 VITALS — BP 142/86 | HR 69 | Temp 97.6°F | Resp 12

## 2014-11-28 DIAGNOSIS — Q828 Other specified congenital malformations of skin: Secondary | ICD-10-CM

## 2014-11-28 DIAGNOSIS — E1142 Type 2 diabetes mellitus with diabetic polyneuropathy: Secondary | ICD-10-CM

## 2014-11-28 DIAGNOSIS — L84 Corns and callosities: Secondary | ICD-10-CM

## 2014-11-28 NOTE — Patient Instructions (Signed)
Diabetes and Foot Care Diabetes may cause you to have problems because of poor blood supply (circulation) to your feet and legs. This may cause the skin on your feet to become thinner, break easier, and heal more slowly. Your skin may become dry, and the skin may peel and crack. You may also have nerve damage in your legs and feet causing decreased feeling in them. You may not notice minor injuries to your feet that could lead to infections or more serious problems. Taking care of your feet is one of the most important things you can do for yourself.  HOME CARE INSTRUCTIONS  Wear shoes at all times, even in the house. Do not go barefoot. Bare feet are easily injured.  Check your feet daily for blisters, cuts, and redness. If you cannot see the bottom of your feet, use a mirror or ask someone for help.  Wash your feet with warm water (do not use hot water) and mild soap. Then pat your feet and the areas between your toes until they are completely dry. Do not soak your feet as this can dry your skin.  Apply a moisturizing lotion or petroleum jelly (that does not contain alcohol and is unscented) to the skin on your feet and to dry, brittle toenails. Do not apply lotion between your toes.  Trim your toenails straight across. Do not dig under them or around the cuticle. File the edges of your nails with an emery board or nail file.  Do not cut corns or calluses or try to remove them with medicine.  Wear clean socks or stockings every day. Make sure they are not too tight. Do not wear knee-high stockings since they may decrease blood flow to your legs.  Wear shoes that fit properly and have enough cushioning. To break in new shoes, wear them for just a few hours a day. This prevents you from injuring your feet. Always look in your shoes before you put them on to be sure there are no objects inside.  Do not cross your legs. This may decrease the blood flow to your feet.  If you find a minor scrape,  cut, or break in the skin on your feet, keep it and the skin around it clean and dry. These areas may be cleansed with mild soap and water. Do not cleanse the area with peroxide, alcohol, or iodine.  When you remove an adhesive bandage, be sure not to damage the skin around it.  If you have a wound, look at it several times a day to make sure it is healing.  Do not use heating pads or hot water bottles. They may burn your skin. If you have lost feeling in your feet or legs, you may not know it is happening until it is too late.  Make sure your health care provider performs a complete foot exam at least annually or more often if you have foot problems. Report any cuts, sores, or bruises to your health care provider immediately. SEEK MEDICAL CARE IF:   You have an injury that is not healing.  You have cuts or breaks in the skin.  You have an ingrown nail.  You notice redness on your legs or feet.  You feel burning or tingling in your legs or feet.  You have pain or cramps in your legs and feet.  Your legs or feet are numb.  Your feet always feel cold. SEEK IMMEDIATE MEDICAL CARE IF:   There is increasing redness,   swelling, or pain in or around a wound.  There is a red line that goes up your leg.  Pus is coming from a wound.  You develop a fever or as directed by your health care provider.  You notice a bad smell coming from an ulcer or wound.   This information is not intended to replace advice given to you by your health care provider. Make sure you discuss any questions you have with your health care provider.   Document Released: 02/07/2000 Document Revised: 10/12/2012 Document Reviewed: 07/19/2012 Elsevier Interactive Patient Education 2016 [employer]  

## 2014-11-29 ENCOUNTER — Encounter: Payer: Self-pay | Admitting: Internal Medicine

## 2014-11-29 ENCOUNTER — Encounter: Payer: Medicaid Other | Admitting: Internal Medicine

## 2014-11-29 ENCOUNTER — Ambulatory Visit (INDEPENDENT_AMBULATORY_CARE_PROVIDER_SITE_OTHER): Payer: Medicaid Other | Admitting: Internal Medicine

## 2014-11-29 VITALS — BP 155/53 | HR 80 | Temp 98.1°F | Ht 65.0 in | Wt 142.5 lb

## 2014-11-29 DIAGNOSIS — R61 Generalized hyperhidrosis: Secondary | ICD-10-CM

## 2014-11-29 DIAGNOSIS — F17210 Nicotine dependence, cigarettes, uncomplicated: Secondary | ICD-10-CM | POA: Diagnosis not present

## 2014-11-29 DIAGNOSIS — I129 Hypertensive chronic kidney disease with stage 1 through stage 4 chronic kidney disease, or unspecified chronic kidney disease: Secondary | ICD-10-CM | POA: Diagnosis not present

## 2014-11-29 DIAGNOSIS — M810 Age-related osteoporosis without current pathological fracture: Secondary | ICD-10-CM | POA: Diagnosis not present

## 2014-11-29 DIAGNOSIS — E1122 Type 2 diabetes mellitus with diabetic chronic kidney disease: Secondary | ICD-10-CM

## 2014-11-29 DIAGNOSIS — E11319 Type 2 diabetes mellitus with unspecified diabetic retinopathy without macular edema: Secondary | ICD-10-CM

## 2014-11-29 DIAGNOSIS — N939 Abnormal uterine and vaginal bleeding, unspecified: Secondary | ICD-10-CM

## 2014-11-29 DIAGNOSIS — Z0000 Encounter for general adult medical examination without abnormal findings: Secondary | ICD-10-CM

## 2014-11-29 DIAGNOSIS — F17200 Nicotine dependence, unspecified, uncomplicated: Secondary | ICD-10-CM

## 2014-11-29 DIAGNOSIS — Z794 Long term (current) use of insulin: Secondary | ICD-10-CM | POA: Diagnosis not present

## 2014-11-29 DIAGNOSIS — I10 Essential (primary) hypertension: Secondary | ICD-10-CM

## 2014-11-29 DIAGNOSIS — N183 Chronic kidney disease, stage 3 (moderate): Secondary | ICD-10-CM

## 2014-11-29 DIAGNOSIS — Z23 Encounter for immunization: Secondary | ICD-10-CM

## 2014-11-29 MED ORDER — NICOTINE POLACRILEX 2 MG MT GUM: 2.0000 mg | CHEWING_GUM | OROMUCOSAL | Status: DC | PRN

## 2014-11-29 NOTE — Patient Instructions (Signed)
I want you to get the DEXA scan of your bones. You also need to see the OBGYN about your fibroids.  General Instructions:   Please bring your medicines with you each time you come to clinic.  Medicines may include prescription medications, over-the-counter medications, herbal remedies, eye drops, vitamins, or other pills.   Progress Toward Treatment Goals:  Treatment Goal 08/09/2014  Hemoglobin A1C unchanged  Blood pressure at goal  Stop smoking -    Self Care Goals & Plans:  Self Care Goal 11/29/2014  Manage my medications take my medicines as prescribed; bring my medications to every visit  Monitor my health keep track of my blood glucose; bring my glucose meter and log to each visit  Eat healthy foods drink diet soda or water instead of juice or soda; eat more vegetables; eat baked foods instead of fried foods; eat foods that are low in salt  Be physically active take a walk every day; find an activity I enjoy  Stop smoking set a quit date and stop smoking    Home Blood Glucose Monitoring 08/09/2014  Check my blood sugar 3 times a day  When to check my blood sugar before meals     Care Management & Community Referrals:  Referral 03/29/2014  Referrals made for care management support none needed

## 2014-11-29 NOTE — Progress Notes (Signed)
Patient ID: [name], female   DOB: 1950/07/11, 64 y.o.   MRN: [id number]  Subjective: This patient presents for ongoing care for debridement of pre-ulcerative or ulcerative plantar calluses on the right and left feet at approximately 2-4 week intervals. These lesions are chronic daily and require repetitive debridement. Patient wears athletic style shoes with diabetic insoles. Patient has history of missing schedule appointments  Objective: Orientated 3 Right foot Bleeding callus fifth MPJ right Non-bleeding callus first MPJ  Left foot Bleeding callus first MPJ left Plantar callus fifth MPJ Plantar callus left heel  Assessment: Pre-ulcerative plantar callus 2 Plantar keratoses 3  Plan: Primary pre-ulcerative calluses in care and calluses right and left feet Patient advised to apply Silvadene if any drainage noted from plantar calluses Continue wearing existing diabetic shoes  Reappoint 3 weeks

## 2014-12-02 DIAGNOSIS — R61 Generalized hyperhidrosis: Secondary | ICD-10-CM | POA: Insufficient documentation

## 2014-12-02 DIAGNOSIS — N939 Abnormal uterine and vaginal bleeding, unspecified: Secondary | ICD-10-CM | POA: Insufficient documentation

## 2014-12-02 NOTE — Assessment & Plan Note (Addendum)
Lab Results  Component Value Date   HGBA1C 7.4 08/09/2014   HGBA1C 7.3 03/29/2014   HGBA1C 7.4* 12/02/2013    Wt Readings from Last 5 Encounters:  11/29/14 142 lb 8 oz (64.638 kg)  11/05/14 144 lb (65.318 kg)  11/03/14 144 lb 1.6 oz (65.363 kg)  08/09/14 144 lb 4.8 oz (65.454 kg)  08/03/14 145 lb 12.8 oz (66.134 kg)   She has remained off metformin and has resumed herself on lantus 15 units daily.  She report a few mild hypoglycemic episodes but does not bring her meter in for review. Assessment: Diabetes control: fair control Progress toward A1C goal:  unchanged Comments:   Plan: Medications:  Reduce lantus to 10 units dialy, continue januvia 50mg  daily Home glucose monitoring: Frequency: 3 times a day Timing: before meals Instruction/counseling given: reminded to bring blood glucose meter & log to each visit and reminded to bring medications to each visit Educational resources provided: handout Self management tools provided:   Other plans:   He has evidence of preulcerative calluses on exam, I have filled out paperwork for diabetic shoes

## 2014-12-02 NOTE — Assessment & Plan Note (Addendum)
BP Readings from Last 3 Encounters:  11/29/14 155/53  11/28/14 142/86  11/06/14 134/43    Lab Results  Component Value Date   NA 138 11/05/2014   K 4.2 11/05/2014   CREATININE 1.30* 11/05/2014   She has restarted Losartan 25mg  on her own. Assessment: Blood pressure control: mildly elevated Progress toward BP goal:  deteriorated   Plan: Medications:  Losartan 25mg  daily Educational resources provided:   Self management tools provided:    Other plans: consider repeating BMP and increasing losartan at next visit if still uncontrolled

## 2014-12-02 NOTE — Assessment & Plan Note (Signed)
She has had 5 years of treatment with fosamax.  She does not need further treatment at this point but she is somewhat concerned and does report that she frequently missed doses of alendronate.  -A repeat DEXA has already been ordered, I will have her complete this. -D/C fosamax.

## 2014-12-02 NOTE — Assessment & Plan Note (Signed)
She reports occasional episodes of night sweats.  She did previously loose weight earlier this year but that seems to have stabilized.  She is up to date of most of her screening tests and has had multiple imaging studies with her frequent ED visits.  Her TSH was wnl earlier this year.  This could be explained by some lingering post menopausal symptoms.  However the most important thing for her at this time is follow up with OB/GYN.  -Will continue to monitor

## 2014-12-02 NOTE — Assessment & Plan Note (Signed)
  Assessment: Progress toward smoking cessation:  smoking the same amount Barriers to progress toward smoking cessation:  none Comments: in contemplation stage  Plan: Instruction/counseling given:  I counseled patient on the dangers of tobacco use, advised patient to stop smoking, and reviewed strategies to maximize success.  I spent 2 minutes counseling patient. Educational resources provided:  [employer] account manager) brochure Self management tools provided:  smoking cessation plan (STAR Quit Plan) Medications to assist with smoking cessation:  Nicotine Gum Patient agreed to the following self-care plans for smoking cessation: set a quit date and stop smoking  Other plans:

## 2014-12-02 NOTE — Progress Notes (Addendum)
[facility] Subjective:   Patient ID: [name] female   DOB: [birth year] 64 y.o.   MRN: [id number]  HPI: [name] is a 64 y.o. female with a PMH detailed below who presents for overdue follow up as well as ER follow up. She was seen in the ED on 9/2 for vaginal bleeding.  She was instructed to follow up with OB/GYN but did not make it to this appointment.  She reports she has not had further vaginal bleeding. She was then seen in the ED on 9/10 for left flank pain and a renal U/S suggested partial obstruction.  Her pain was controlled and she was discharged.   She returned on 9/12 for the same complaint and a CT showed no renal stone or hydronephrosis but finding suggestive of a past kidney stone.  She reports her pain has continued to be resolved scince that visit and she has not needed further pain medications.   Please see A&P below for the status of her chronic medical problems.    Past Medical History  Diagnosis Date  . Diabetes mellitus   . Hypertension   . ANEMIA, CHRONIC DISEASE NEC 03/15/2006  . DEGENERATIVE JOINT DISEASE 05/06/2007    On going for >5 yrs No imaging to confirm Has tried ultram, mobic, neurontin which did not work Ambulance person tried once worked well    . 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 DIABETIC  RETINOPATHY 03/15/2006  . Hyperlipidemia 04/01/2011  . PANCREATITIS, CHRONIC 03/29/2006  . RENAL INSUFFICIENCY, CHRONIC 03/15/2006  . Peripheral arterial disease ([place])   . Hemorrhoids    Current Outpatient Prescriptions  Medication Sig Dispense Refill  . losartan (COZAAR) 25 MG tablet Take 25 mg by mouth daily.    Marland Kitchen acetaminophen (TYLENOL) 500 MG tablet Take 1,000 mg by mouth every 6 (six) hours as needed for moderate pain.    Marland Kitchen albuterol (PROVENTIL HFA) 108 (90 BASE) MCG/ACT inhaler Inhale 2 puffs into the lungs every 6 (six) hours as needed for wheezing. 6.7 g 6  . amitriptyline (ELAVIL) 25 MG tablet Take 1 tablet (25 mg total)  by mouth at bedtime. 90 tablet 0  . insulin glargine (LANTUS) 100 UNIT/ML injection Inject 10 Units into the skin every morning.     Marland Kitchen ipratropium (ATROVENT HFA) 17 MCG/ACT inhaler inhale 1 to 2 puffs every 6 hours if needed 12.9 g 2  . nicotine polacrilex (NICORETTE) 2 MG gum Take 1 each (2 mg total) by mouth as needed for smoking cessation. 100 tablet 1  . ondansetron (ZOFRAN ODT) 4 MG disintegrating tablet Take 1 tablet (4 mg total) by mouth every 8 (eight) hours as needed for nausea or vomiting. 20 tablet 0  . Pancrelipase, Lip-Prot-Amyl, 24000 UNITS CPEP Take 3 tablets With meals and 2 tablets with snacks. No more than 10/24hours (Patient taking differently: Take 48,000-72,000 Units by mouth See admin instructions. Take 3 tablets With meals and 2 tablets with snacks. No more than 10/24hours) 180 capsule 11  . polyethylene glycol (MIRALAX / GLYCOLAX) packet Take 17 g by mouth daily as needed for mild constipation. 14 each 5  . promethazine (PHENERGAN) 12.5 MG tablet Take 1 tablet (12.5 mg total) by mouth every 6 (six) hours as needed for nausea or vomiting. 60 tablet 2  . silver sulfADIAZINE (SILVADENE) 1 % cream Apply 1 application topically daily. (Patient taking differently: Apply 1 application topically daily as needed (for wound). ) 50 g 0  . sitaGLIPtin (JANUVIA) 50  MG tablet Take 1 tablet (50 mg total) by mouth daily. 30 tablet 2  . traMADol (ULTRAM) 50 MG tablet Take 1 tablet (50 mg total) by mouth every 8 (eight) hours as needed for moderate pain. 60 tablet 2  . travoprost, benzalkonium, (TRAVATAN) 0.004 % ophthalmic solution Place 1 drop into both eyes at bedtime.     No current facility-administered medications for this visit.   No family history on file. Social History   Social History  . Marital Status: Single    Spouse Name: N/A  . Number of Children: N/A  . Years of Education: N/A   Social History Main Topics  . Smoking status: Current Some Day Smoker -- 0.20 packs/day     Types: Cigarettes  . Smokeless tobacco: Never Used     Comment: 3 -6 cigs  . Alcohol Use: No  . Drug Use: No  . Sexual Activity: Not Asked   Other Topics Concern  . None   Social History Narrative   Review of Systems: Review of Systems  Constitutional: Negative for fever, chills and weight loss.       Occasional night sweats.  Eyes: Negative for blurred vision.  Respiratory: Negative for cough and shortness of breath.   Cardiovascular: Negative for chest pain.  Gastrointestinal: Negative for nausea, vomiting, abdominal pain and blood in stool.  Genitourinary: Negative for hematuria.  Musculoskeletal: Negative for myalgias.  Neurological: Negative for dizziness and headaches.  Endo/Heme/Allergies: Negative for polydipsia.    Objective:  Physical Exam: Filed Vitals:   11/29/14 1531  BP: 155/53  Pulse: 80  Temp: 98.1 F (36.7 C)  TempSrc: Oral  Height: 5\' 5"  (1.651 m)  Weight: 142 lb 8 oz (64.638 kg)  SpO2: 100%  Physical Exam  Constitutional: She is oriented to person, place, and time and well-developed, well-nourished, and in no distress.  HENT:  Head: Normocephalic and atraumatic.  Eyes: Conjunctivae are normal.  Cardiovascular: Normal rate, regular rhythm and normal heart sounds.   No murmur heard. Pulmonary/Chest: Effort normal and breath sounds normal. She has no wheezes.  Abdominal: Soft. Bowel sounds are normal. She exhibits no distension. There is no tenderness. There is no rebound.  Musculoskeletal: She exhibits no edema.  Neurological: She is alert and oriented to person, place, and time.  Skin: Skin is warm and dry.  Psychiatric: Affect normal.  Nursing note and vitals reviewed.   Assessment & Plan:  Case discussed with Dr. Evette Doffing  Essential hypertension BP Readings from Last 3 Encounters:  11/29/14 155/53  11/28/14 142/86  11/06/14 134/43    Lab Results  Component Value Date   NA 138 11/05/2014   K 4.2 11/05/2014   CREATININE 1.30*  11/05/2014   She has restarted Losartan 25mg  on her own. Assessment: Blood pressure control: mildly elevated Progress toward BP goal:  deteriorated   Plan: Medications:  Losartan 25mg  daily Educational resources provided:   Self management tools provided:    Other plans: consider repeating BMP and increasing losartan at next visit if still uncontrolled      CKD stage 3 due to type 2 diabetes mellitus ([place]) Her SCr has imporved with recent checks at the ER.  She has restarted losartan.  Will continue to avoid metformin.  -Check BMP at next visit.  Osteoporosis She has had 5 years of treatment with fosamax.  She does not need further treatment at this point but she is somewhat concerned and does report that she frequently missed doses of alendronate.  -A  repeat DEXA has already been ordered, I will have her complete this. -D/C fosamax.  Diabetes mellitus type 2 with retinopathy Lab Results  Component Value Date   HGBA1C 7.4 08/09/2014   HGBA1C 7.3 03/29/2014   HGBA1C 7.4* 12/02/2013    Wt Readings from Last 5 Encounters:  11/29/14 142 lb 8 oz (64.638 kg)  11/05/14 144 lb (65.318 kg)  11/03/14 144 lb 1.6 oz (65.363 kg)  08/09/14 144 lb 4.8 oz (65.454 kg)  08/03/14 145 lb 12.8 oz (66.134 kg)   She has remained off metformin and has resumed herself on lantus 15 units daily.  She report a few mild hypoglycemic episodes but does not bring her meter in for review. Assessment: Diabetes control: fair control Progress toward A1C goal:  unchanged Comments:   Plan: Medications:  Reduce lantus to 10 units dialy, continue [place] 50mg  daily Home glucose monitoring: Frequency: 3 times a day Timing: before meals Instruction/counseling given: reminded to bring blood glucose meter & log to each visit and reminded to bring medications to each visit Educational resources provided: handout Self management tools provided:   Other plans:   He has evidence of preulcerative calluses  on exam, I have filled out paperwork for diabetic shoes  Abnormal uterine bleeding She reports she has not had further bleeding since the ED visit.  She was not able to attend her follow up at OB/GYN due to transportation issues.  Her CT abd/pelvis suggested a sub serosal uterine fibroid which is the likely source of her bleeding.  However given her age she will need an endometrial biopsy from OB/GYN.  I have informed her of the importance of this appointment and we have assisted her in re-scheduling.  TOBACCO ABUSE  Assessment: Progress toward smoking cessation:  smoking the same amount Barriers to progress toward smoking cessation:  none Comments: in contemplation stage  Plan: Instruction/counseling given:  I counseled patient on the dangers of tobacco use, advised patient to stop smoking, and reviewed strategies to maximize success.  I spent 2 minutes counseling patient. Educational resources provided:  [employer] Insurance account manager) brochure Self management tools provided:  smoking cessation plan (STAR Quit Plan) Medications to assist with smoking cessation:  Nicotine Gum Patient agreed to the following self-care plans for smoking cessation: set a quit date and stop smoking  Other plans:     Healthcare maintenance - Prevnar 13 given - Flu vaccine given.  Unexplained night sweats She reports occasional episodes of night sweats.  She did previously loose weight earlier this year but that seems to have stabilized.  She is up to date of most of her screening tests and has had multiple imaging studies with her frequent ED visits.  Her TSH was wnl earlier this year.  This could be explained by some lingering post menopausal symptoms.  However the most important thing for her at this time is follow up with OB/GYN.  -Will continue to monitor    Medications Ordered Meds ordered this encounter  Medications  . nicotine polacrilex (NICORETTE) 2 MG gum    Sig: Take 1 each (2 mg total)  by mouth as needed for smoking cessation.    Dispense:  100 tablet    Refill:  1  . losartan (COZAAR) 25 MG tablet    Sig: Take 25 mg by mouth daily.   Other Orders Orders Placed This Encounter  Procedures  . Flu Vaccine QUAD 36+ mos IM  . Pneumococcal conjugate vaccine 13-valent   Follow Up: Return in about 3  months (around 03/01/2015).

## 2014-12-02 NOTE — Assessment & Plan Note (Signed)
Her SCr has imporved with recent checks at the ER.  She has restarted losartan.  Will continue to avoid metformin.  -Check BMP at next visit.

## 2014-12-02 NOTE — Assessment & Plan Note (Signed)
-   Prevnar 13 given - Flu vaccine given.

## 2014-12-02 NOTE — Assessment & Plan Note (Signed)
She reports she has not had further bleeding since the ED visit.  She was not able to attend her follow up at OB/GYN due to transportation issues.  Her CT abd/pelvis suggested a sub serosal uterine fibroid which is the likely source of her bleeding.  However given her age she will need an endometrial biopsy from OB/GYN.  I have informed her of the importance of this appointment and we have assisted her in re-scheduling.

## 2014-12-03 NOTE — Progress Notes (Signed)
Internal Medicine Clinic Attending  Case discussed with Dr. Hoffman soon after the resident saw the patient.  We reviewed the resident's history and exam and pertinent patient test results.  I agree with the assessment, diagnosis, and plan of care documented in the resident's note. 

## 2014-12-05 ENCOUNTER — Encounter: Payer: Self-pay | Admitting: Internal Medicine

## 2014-12-05 ENCOUNTER — Ambulatory Visit (INDEPENDENT_AMBULATORY_CARE_PROVIDER_SITE_OTHER): Payer: Medicaid Other | Admitting: Internal Medicine

## 2014-12-05 VITALS — BP 172/64 | HR 58 | Temp 97.9°F | Ht 65.0 in | Wt 146.5 lb

## 2014-12-05 DIAGNOSIS — E11319 Type 2 diabetes mellitus with unspecified diabetic retinopathy without macular edema: Secondary | ICD-10-CM

## 2014-12-05 DIAGNOSIS — R011 Cardiac murmur, unspecified: Secondary | ICD-10-CM | POA: Diagnosis not present

## 2014-12-05 DIAGNOSIS — I10 Essential (primary) hypertension: Secondary | ICD-10-CM

## 2014-12-05 DIAGNOSIS — Z794 Long term (current) use of insulin: Secondary | ICD-10-CM | POA: Diagnosis not present

## 2014-12-05 LAB — GLUCOSE, CAPILLARY: Glucose-Capillary: 104 mg/dL — ABNORMAL HIGH (ref 65–99)

## 2014-12-05 LAB — POCT GLYCOSYLATED HEMOGLOBIN (HGB A1C): HEMOGLOBIN A1C: 6.8

## 2014-12-05 MED ORDER — LOSARTAN POTASSIUM 50 MG PO TABS: 50.0000 mg | ORAL_TABLET | Freq: Every day | ORAL | Status: DC

## 2014-12-05 NOTE — Patient Instructions (Addendum)
Ms. Hucker it was nice meeting you today.  -STOP taking Losartan 25 mg  -Start taking Losartan 50 mg: 1 tablet by mouth daily.   -Continue taking Lantus 10 units daily and Januvia 50 mg daily for diabetes.   -Please return for a follow-up visit in 2 weeks.

## 2014-12-06 LAB — BMP8+ANION GAP
Anion Gap: 15 mmol/L (ref 10.0–18.0)
BUN/Creatinine Ratio: 22 (ref 11–26)
BUN: 26 mg/dL (ref 8–27)
CALCIUM: 9.4 mg/dL (ref 8.7–10.3)
CO2: 23 mmol/L (ref 18–29)
CREATININE: 1.18 mg/dL — AB (ref 0.57–1.00)
Chloride: 102 mmol/L (ref 97–108)
GFR calc Af Amer: 57 mL/min/{1.73_m2} — ABNORMAL LOW (ref >59)
GFR calc non Af Amer: 50 mL/min/{1.73_m2} — ABNORMAL LOW (ref >59)
Glucose: 80 mg/dL (ref 65–99)
Potassium: 4.2 mmol/L (ref 3.5–5.2)
Sodium: 140 mmol/L (ref 134–144)

## 2014-12-06 NOTE — Assessment & Plan Note (Addendum)
BP Readings from Last 3 Encounters:  12/05/14 172/64  11/29/14 155/53  11/28/14 142/86    Lab Results  Component Value Date   NA 140 12/05/2014   K 4.2 12/05/2014   CREATININE 1.18* 12/05/2014    Assessment: Blood pressure control:   elevated, goal <140/90 Progress toward BP goal:   deteriorated  Comments: Initial blood pressure 172/64, repeat 160/60. BMET today showing improvement in serum creatinine from 1.30 on 11/05/2014 to 1.18 today. She is currently on losartan 25 mg daily. Patient states she has a blood pressure cuff at home but has not been using it because it is not accurate. She is complaining of a headache for the past 4 days. Location of the headache is all over the head. It is throbbing, intermittent in nature. Headache is not associated with any nausea, vomiting, photophobia, or sensitivity to sounds. Denies any changes in vision. Denies hearing pulsatile tinnitus. Denies chest pain or shortness of breath.   Plan: Medications: Losartan increased to 50 mg daily. Educational resources provided:   Encouraged healthy eating and exercise. Self management tools provided:   Advised patient to monitor her blood pressure at home and keep a log. Other plans: Follow-up visit in 2 weeks

## 2014-12-06 NOTE — Assessment & Plan Note (Signed)
Lab Results  Component Value Date   HGBA1C 6.8 12/05/2014   HGBA1C 7.4 08/09/2014   HGBA1C 7.3 03/29/2014     Assessment: Diabetes control:   controlled, below goal <7 Progress toward A1C goal:    improved Comments: She is currently on Lantus 10 units daily and Januvia 50 mg daily. Denies having any episodes of hypoglycemia.   Plan: Medications:  continue current medications Home glucose monitoring: Frequency:   Timing:   Instruction/counseling given: reminded to bring blood glucose meter & log to each visit Educational resources provided:  Encouraged healthy eating and exercise.  Other plans:

## 2014-12-06 NOTE — Progress Notes (Signed)
Patient ID: [name], female   DOB: 1951-01-01, 64 y.o.   MRN: [id number]   Subjective:   Patient ID: [name] female   DOB: Apr 13, 1950 64 y.o.   MRN: [id number]  HPI: [name] is a 64 y.o. F with a PMHx of diabetes, hypertension, hyperlipidemia, peripheral arterial disease presenting to the clinic today for follow-up of her hypertension and diabetes. Please refer to problem based assessment and plan for the status of the patient's chronic medical conditions.    Past Medical History  Diagnosis Date  . Diabetes mellitus   . Hypertension   . ANEMIA, CHRONIC DISEASE NEC 03/15/2006  . DEGENERATIVE JOINT DISEASE 05/06/2007    On going for >5 yrs No imaging to confirm Has tried ultram, mobic, neurontin which did not work Ambulance person tried once worked well    . 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 DIABETIC  RETINOPATHY 03/15/2006  . Hyperlipidemia 04/01/2011  . PANCREATITIS, CHRONIC 03/29/2006  . RENAL INSUFFICIENCY, CHRONIC 03/15/2006  . Peripheral arterial disease ([place])   . Hemorrhoids    Current Outpatient Prescriptions  Medication Sig Dispense Refill  . acetaminophen (TYLENOL) 500 MG tablet Take 1,000 mg by mouth [name] 6 (six) hours as needed for moderate pain.    Marland Kitchen albuterol (PROVENTIL HFA) 108 (90 BASE) MCG/ACT inhaler Inhale 2 puffs into the lungs [name] 6 (six) hours as needed for wheezing. 6.7 g 6  . amitriptyline (ELAVIL) 25 MG tablet Take 1 tablet (25 mg total) by mouth at bedtime. 90 tablet 0  . insulin glargine (LANTUS) 100 UNIT/ML injection Inject 10 Units into the skin [name] morning.     Marland Kitchen ipratropium (ATROVENT HFA) 17 MCG/ACT inhaler inhale 1 to 2 puffs [name] 6 hours if needed 12.9 g 2  . losartan (COZAAR) 50 MG tablet Take 1 tablet (50 mg total) by mouth daily. 90 tablet 1  . nicotine polacrilex (NICORETTE) 2 MG gum Take 1 each (2 mg total) by mouth as needed for smoking cessation. 100 tablet 1  . ondansetron (ZOFRAN ODT) 4 MG disintegrating tablet Take 1  tablet (4 mg total) by mouth [name] 8 (eight) hours as needed for nausea or vomiting. 20 tablet 0  . Pancrelipase, Lip-Prot-Amyl, 24000 UNITS CPEP Take 3 tablets With meals and 2 tablets with snacks. No more than 10/24hours (Patient taking differently: Take 48,000-72,000 Units by mouth See admin instructions. Take 3 tablets With meals and 2 tablets with snacks. No more than 10/24hours) 180 capsule 11  . polyethylene glycol (MIRALAX / GLYCOLAX) packet Take 17 g by mouth daily as needed for mild constipation. 14 each 5  . promethazine (PHENERGAN) 12.5 MG tablet Take 1 tablet (12.5 mg total) by mouth [name] 6 (six) hours as needed for nausea or vomiting. 60 tablet 2  . silver sulfADIAZINE (SILVADENE) 1 % cream Apply 1 application topically daily. (Patient taking differently: Apply 1 application topically daily as needed (for wound). ) 50 g 0  . sitaGLIPtin (JANUVIA) 50 MG tablet Take 1 tablet (50 mg total) by mouth daily. 30 tablet 2  . traMADol (ULTRAM) 50 MG tablet Take 1 tablet (50 mg total) by mouth [name] 8 (eight) hours as needed for moderate pain. 60 tablet 2  . travoprost, benzalkonium, (TRAVATAN) 0.004 % ophthalmic solution Place 1 drop into both eyes at bedtime.     No current facility-administered medications for this visit.   No family history on file. Social History   Social History  . Marital Status: Single  Spouse Name: N/A  . Number of Children: N/A  . Years of Education: N/A   Social History Main Topics  . Smoking status: Current Some Day Smoker -- 0.20 packs/day    Types: Cigarettes  . Smokeless tobacco: Never Used     Comment: 3 -6 cigs  . Alcohol Use: No  . Drug Use: No  . Sexual Activity: Not Asked   Other Topics Concern  . None   Social History Narrative   Review of Systems: Review of Systems  Constitutional: Negative for fever and chills.  HENT: Negative for ear pain.   Eyes: Negative for blurred vision and pain.  Respiratory: Negative for cough, shortness of  breath and wheezing.   Cardiovascular: Negative for chest pain, palpitations and leg swelling.  Gastrointestinal: Negative for nausea, vomiting, abdominal pain, diarrhea and constipation.  Genitourinary: Negative for dysuria, urgency and frequency.  Musculoskeletal: Negative for myalgias.  Skin: Negative for itching and rash.  Neurological: Positive for headaches. Negative for dizziness, sensory change and focal weakness.     Objective:  Physical Exam: Filed Vitals:   12/05/14 0933  BP: 172/64  Pulse: 58  Temp: 97.9 F (36.6 C)  TempSrc: Oral  Height: 5\' 5"  (1.651 m)  Weight: 146 lb 8 oz (66.452 kg)  SpO2: 100%   Physical Exam  Constitutional: She is oriented to person, place, and time. She appears well-developed and well-nourished. No distress.  HENT:  Head: Normocephalic and atraumatic.  Eyes: EOM are normal. Pupils are equal, round, and reactive to light.  Neck: Neck supple. No tracheal deviation present.  Cardiovascular: Normal rate, regular rhythm and intact distal pulses.   Murmur heard. Diastolic murmur appreciated   Pulmonary/Chest: Effort normal. No respiratory distress. She has no wheezes. She has no rales.  Abdominal: Soft. Bowel sounds are normal. She exhibits no distension. There is no tenderness.  Musculoskeletal: She exhibits no edema.  Neurological: She is alert and oriented to person, place, and time.  Skin: Skin is warm and dry.     Assessment & Plan:

## 2014-12-06 NOTE — Assessment & Plan Note (Addendum)
Diastolic murmur appreciated during physical exam today. Echo from September 2010 showing an ejection fraction of 60-65% and mild aortic regurgitation. -Echo ordered today  Addendum (12/17/14 at 10:36 am): Echo showing EF 80-99%, grade 1 diastolic dysfunction, and moderate aortic regurgitation.

## 2014-12-08 NOTE — Progress Notes (Signed)
Internal Medicine Clinic Attending  I saw and evaluated the patient.  I personally confirmed the key portions of the history and exam documented by Dr. Rathore and I reviewed pertinent patient test results.  The assessment, diagnosis, and plan were formulated together and I agree with the documentation in the resident's note.  

## 2014-12-10 ENCOUNTER — Telehealth: Payer: Self-pay | Admitting: Internal Medicine

## 2014-12-10 NOTE — Telephone Encounter (Signed)
Pt called requesting the nurse to call back regarding losartan Rx.

## 2014-12-10 NOTE — Telephone Encounter (Signed)
Pt states insurance will not pay for Losartan 50 mg. I called [facility] pharmacy; stated PA is needed since it was increased. I will send this to Adline Peals to f/u.

## 2014-12-11 ENCOUNTER — Other Ambulatory Visit (HOSPITAL_COMMUNITY): Payer: Medicaid Other

## 2014-12-11 ENCOUNTER — Other Ambulatory Visit: Payer: Self-pay | Admitting: Internal Medicine

## 2014-12-11 DIAGNOSIS — E11319 Type 2 diabetes mellitus with unspecified diabetic retinopathy without macular edema: Secondary | ICD-10-CM

## 2014-12-13 ENCOUNTER — Ambulatory Visit (HOSPITAL_COMMUNITY)
Admission: RE | Admit: 2014-12-13 | Discharge: 2014-12-13 | Disposition: A | Discharge status: Home / Self Care | Payer: Medicaid Other | Source: Ambulatory Visit | Attending: Internal Medicine | Admitting: Internal Medicine

## 2014-12-13 DIAGNOSIS — N189 Chronic kidney disease, unspecified: Secondary | ICD-10-CM | POA: Diagnosis not present

## 2014-12-13 DIAGNOSIS — I351 Nonrheumatic aortic (valve) insufficiency: Secondary | ICD-10-CM | POA: Insufficient documentation

## 2014-12-13 DIAGNOSIS — I517 Cardiomegaly: Secondary | ICD-10-CM | POA: Insufficient documentation

## 2014-12-13 DIAGNOSIS — E119 Type 2 diabetes mellitus without complications: Secondary | ICD-10-CM | POA: Insufficient documentation

## 2014-12-13 DIAGNOSIS — E785 Hyperlipidemia, unspecified: Secondary | ICD-10-CM | POA: Diagnosis not present

## 2014-12-13 DIAGNOSIS — R011 Cardiac murmur, unspecified: Secondary | ICD-10-CM | POA: Insufficient documentation

## 2014-12-13 DIAGNOSIS — I129 Hypertensive chronic kidney disease with stage 1 through stage 4 chronic kidney disease, or unspecified chronic kidney disease: Secondary | ICD-10-CM | POA: Insufficient documentation

## 2014-12-13 NOTE — Progress Notes (Signed)
  Echocardiogram 2D Echocardiogram has been performed.  [name] 12/13/2014, 3:02 PM

## 2014-12-14 ENCOUNTER — Other Ambulatory Visit: Payer: Self-pay | Admitting: Internal Medicine

## 2014-12-14 NOTE — Telephone Encounter (Signed)
Pt states blood pressure is high, she need her medicine losartan. Please call pt back.

## 2014-12-14 NOTE — Telephone Encounter (Signed)
Spoke w/ kayeg. This will be resolved today. Called pt, reassured her, she is upset stating she was told when she had her "heart procedure" 10/20 that imc should have already gotten her medication for her due to the severity of her problems. Once again she was reassured and told she would be called this pm with an answer, she is agreeable

## 2014-12-14 NOTE — Telephone Encounter (Signed)
Request submitted online via [employer]-medication approved through 12/14/2015-pt aware. Phone call complete.[name], Kiara Pittsley Cassady10/21/201611:26 AM

## 2014-12-17 ENCOUNTER — Other Ambulatory Visit: Payer: Self-pay | Admitting: Internal Medicine

## 2014-12-17 DIAGNOSIS — E11319 Type 2 diabetes mellitus with unspecified diabetic retinopathy without macular edema: Secondary | ICD-10-CM

## 2014-12-19 ENCOUNTER — Encounter: Payer: Self-pay | Admitting: Podiatry

## 2014-12-19 ENCOUNTER — Ambulatory Visit (INDEPENDENT_AMBULATORY_CARE_PROVIDER_SITE_OTHER): Payer: Medicaid Other | Admitting: Podiatry

## 2014-12-19 VITALS — BP 148/69 | HR 72 | Temp 96.6°F | Resp 12

## 2014-12-19 DIAGNOSIS — L84 Corns and callosities: Secondary | ICD-10-CM

## 2014-12-19 DIAGNOSIS — M79676 Pain in unspecified toe(s): Secondary | ICD-10-CM | POA: Diagnosis not present

## 2014-12-19 DIAGNOSIS — Q828 Other specified congenital malformations of skin: Secondary | ICD-10-CM | POA: Diagnosis not present

## 2014-12-19 DIAGNOSIS — E1142 Type 2 diabetes mellitus with diabetic polyneuropathy: Secondary | ICD-10-CM | POA: Diagnosis not present

## 2014-12-19 DIAGNOSIS — B351 Tinea unguium: Secondary | ICD-10-CM | POA: Diagnosis not present

## 2014-12-19 NOTE — Patient Instructions (Signed)
Diabetes and Foot Care Diabetes may cause you to have problems because of poor blood supply (circulation) to your feet and legs. This may cause the skin on your feet to become thinner, break easier, and heal more slowly. Your skin may become dry, and the skin may peel and crack. You may also have nerve damage in your legs and feet causing decreased feeling in them. You may not notice minor injuries to your feet that could lead to infections or more serious problems. Taking care of your feet is one of the most important things you can do for yourself.  HOME CARE INSTRUCTIONS  Wear shoes at all times, even in the house. Do not go barefoot. Bare feet are easily injured.  Check your feet daily for blisters, cuts, and redness. If you cannot see the bottom of your feet, use a mirror or ask someone for help.  Wash your feet with warm water (do not use hot water) and mild soap. Then pat your feet and the areas between your toes until they are completely dry. Do not soak your feet as this can dry your skin.  Apply a moisturizing lotion or petroleum jelly (that does not contain alcohol and is unscented) to the skin on your feet and to dry, brittle toenails. Do not apply lotion between your toes.  Trim your toenails straight across. Do not dig under them or around the cuticle. File the edges of your nails with an emery board or nail file.  Do not cut corns or calluses or try to remove them with medicine.  Wear clean socks or stockings every day. Make sure they are not too tight. Do not wear knee-high stockings since they may decrease blood flow to your legs.  Wear shoes that fit properly and have enough cushioning. To break in new shoes, wear them for just a few hours a day. This prevents you from injuring your feet. Always look in your shoes before you put them on to be sure there are no objects inside.  Do not cross your legs. This may decrease the blood flow to your feet.  If you find a minor scrape,  cut, or break in the skin on your feet, keep it and the skin around it clean and dry. These areas may be cleansed with mild soap and water. Do not cleanse the area with peroxide, alcohol, or iodine.  When you remove an adhesive bandage, be sure not to damage the skin around it.  If you have a wound, look at it several times a day to make sure it is healing.  Do not use heating pads or hot water bottles. They may burn your skin. If you have lost feeling in your feet or legs, you may not know it is happening until it is too late.  Make sure your health care provider performs a complete foot exam at least annually or more often if you have foot problems. Report any cuts, sores, or bruises to your health care provider immediately. SEEK MEDICAL CARE IF:   You have an injury that is not healing.  You have cuts or breaks in the skin.  You have an ingrown nail.  You notice redness on your legs or feet.  You feel burning or tingling in your legs or feet.  You have pain or cramps in your legs and feet.  Your legs or feet are numb.  Your feet always feel cold. SEEK IMMEDIATE MEDICAL CARE IF:   There is increasing redness,   swelling, or pain in or around a wound.  There is a red line that goes up your leg.  Pus is coming from a wound.  You develop a fever or as directed by your health care provider.  You notice a bad smell coming from an ulcer or wound.   This information is not intended to replace advice given to you by your health care provider. Make sure you discuss any questions you have with your health care provider.   Document Released: 02/07/2000 Document Revised: 10/12/2012 Document Reviewed: 07/19/2012 Elsevier Interactive Patient Education 2016 [employer]  

## 2014-12-20 ENCOUNTER — Other Ambulatory Visit (HOSPITAL_COMMUNITY)
Admission: RE | Admit: 2014-12-20 | Discharge: 2014-12-20 | Disposition: A | Discharge status: Home / Self Care | Payer: Medicaid Other | Source: Ambulatory Visit | Attending: Obstetrics and Gynecology | Admitting: Obstetrics and Gynecology

## 2014-12-20 ENCOUNTER — Encounter: Payer: Self-pay | Admitting: Obstetrics and Gynecology

## 2014-12-20 ENCOUNTER — Ambulatory Visit (INDEPENDENT_AMBULATORY_CARE_PROVIDER_SITE_OTHER): Payer: Medicaid Other | Admitting: Obstetrics and Gynecology

## 2014-12-20 VITALS — BP 176/75 | HR 66 | Temp 97.9°F | Ht 66.0 in | Wt 146.2 lb

## 2014-12-20 DIAGNOSIS — Z01419 Encounter for gynecological examination (general) (routine) without abnormal findings: Secondary | ICD-10-CM | POA: Diagnosis present

## 2014-12-20 DIAGNOSIS — Z950 Presence of cardiac pacemaker: Secondary | ICD-10-CM | POA: Diagnosis not present

## 2014-12-20 DIAGNOSIS — Z1151 Encounter for screening for human papillomavirus (HPV): Secondary | ICD-10-CM | POA: Insufficient documentation

## 2014-12-20 DIAGNOSIS — Z124 Encounter for screening for malignant neoplasm of cervix: Secondary | ICD-10-CM | POA: Diagnosis not present

## 2014-12-20 DIAGNOSIS — N950 Postmenopausal bleeding: Secondary | ICD-10-CM

## 2014-12-20 MED ORDER — ACETAMINOPHEN 325 MG PO TABS
650.0000 mg | ORAL_TABLET | Freq: Once | ORAL | Status: AC
  Administered 2014-12-20: 650 mg via ORAL

## 2014-12-20 NOTE — Progress Notes (Signed)
Patient ID: [name], female   DOB: 10-30-1950, 64 y.o.   MRN: [id number]  Subjective: This patient presents for ongoing care for debridement of pre-ulcerative or ulcerative plantar skin lesions are right left feet at 2-4 week intervals. These lesions are chronic and require recurrent debridement. Patient wears athletic style shoes with diabetic insoles. Patient also complaining of uncomfortable nails on the right and left feet  Objective: Orientated 3  Bleeding callus plantar fifth MPJ right Reactive callus first MPJ bilaterally and fifth MPJ left The toenails are elongated, brittle, hypertrophic and tender to palpation 6-10 6-10  Assessment: Diabetic with a history of neuropathy Pre-ulcerative callus 1 and callus 3 Symptomatic mycotic toenails 6-10  Plan: Debrided pre-ulcerative callus and reactive callus right and left 4 Debridement toenails 10 mechanical and electrically without any bleeding Continue wearing diabetic shoes with insoles  Reappoint 4 weeks

## 2014-12-20 NOTE — Addendum Note (Signed)
Addended by: Michel Harrow on: 12/20/2014 02:36 PM   Modules accepted: Orders

## 2014-12-20 NOTE — Progress Notes (Signed)
   Subjective:    Patient ID: [name], female    DOB: 15-Oct-1950, 64 y.o.   MRN: [id number]  HPI  64 yo G3P3 presenting today for the evaluation of postmenopausal vaginal bleeding. Patient reports being postmenopausal since the age of 64. She experienced an episode of vaginal bleeding in September which lasted 3-4 days associated with cramping pain. The first day was heavy followed by a few days of vaginal spotting. She has not had further vaginal bleeding. She is not sexually active  Past Medical History  Diagnosis Date  . Diabetes mellitus   . Hypertension   . ANEMIA, CHRONIC DISEASE NEC 03/15/2006  . DEGENERATIVE JOINT DISEASE 05/06/2007    On going for >5 yrs No imaging to confirm Has tried ultram, mobic, neurontin which did not work Ambulance person tried once worked well    . 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 DIABETIC  RETINOPATHY 03/15/2006  . Hyperlipidemia 04/01/2011  . PANCREATITIS, CHRONIC 03/29/2006  . RENAL INSUFFICIENCY, CHRONIC 03/15/2006  . Peripheral arterial disease ([place])   . Hemorrhoids    Past Surgical History  Procedure Laterality Date  . Breast surgery    . Mouth surgery N/A    No family history on file. Social History  Substance Use Topics  . Smoking status: Current Some Day Smoker -- 0.20 packs/day    Types: Cigarettes  . Smokeless tobacco: Never Used     Comment: 3 -6 cigs  . Alcohol Use: No     Review of Systems See pertinent in HPI    Objective:   Physical Exam GENERAL: Well-developed, well-nourished female in no acute distress.  ABDOMEN: Soft, nontender, nondistended. No organomegaly. PELVIC: Normal external female genitalia. Vagina and cervix are atrophic.  Normal discharge. Uterus is normal in size. No adnexal mass or tenderness. EXTREMITIES: No cyanosis, clubbing, or edema, 2+ distal pulses.        Assessment & Plan:  64 yo with postmenopausal vaginal bleeding - pap smear performed as the patient has not had one since 2006 -  Patient is scheduled for her mammogram next month - Discussed the need to perform an endometrial biopsy ENDOMETRIAL BIOPSY     The indications for endometrial biopsy were reviewed.   Risks of the biopsy including cramping, bleeding, infection, uterine perforation, inadequate specimen and need for additional procedures  were discussed. The patient states she understands and agrees to undergo procedure today. Consent was signed. Time out was performed. Urine HCG was negative. A sterile speculum was placed in the patient's vagina and the cervix was prepped with Betadine. A single-toothed tenaculum was placed on the anterior lip of the cervix to stabilize it. The uterine cavity was sounded to a depth of 7 cm using the uterine sound. The 3 mm pipelle was introduced into the endometrial cavity without difficulty, 2 passes were made.  A  moderate amount of tissue was  sent to pathology. The instruments were removed from the patient's vagina. Minimal bleeding from the cervix was noted. The patient tolerated the procedure well.  Routine post-procedure instructions were given to the patient. The patient will follow up in two weeks to review the results and for further management.  - A pelvic ultrasound was also ordered

## 2014-12-21 ENCOUNTER — Ambulatory Visit: Payer: Medicaid Other | Admitting: Internal Medicine

## 2014-12-21 LAB — CYTOLOGY - PAP

## 2014-12-25 ENCOUNTER — Other Ambulatory Visit: Payer: Self-pay | Admitting: Internal Medicine

## 2014-12-25 ENCOUNTER — Telehealth: Payer: Self-pay | Admitting: *Deleted

## 2014-12-25 DIAGNOSIS — F17200 Nicotine dependence, unspecified, uncomplicated: Secondary | ICD-10-CM

## 2014-12-25 MED ORDER — ALBUTEROL SULFATE HFA 108 (90 BASE) MCG/ACT IN AERS: 2.0000 | INHALATION_SPRAY | Freq: Four times a day (QID) | RESPIRATORY_TRACT | Status: DC | PRN

## 2014-12-25 NOTE — Telephone Encounter (Signed)
Pt requesting Atrovent HFA inhaler to be filled @ [facility] on [address].

## 2014-12-25 NOTE — Telephone Encounter (Signed)
Called patient per Dr Elly Modena and informed her of her negative endometrial biopsy results. Patient expressed relief and voiced understanding.

## 2014-12-26 ENCOUNTER — Other Ambulatory Visit: Payer: Self-pay | Admitting: Internal Medicine

## 2014-12-26 NOTE — Telephone Encounter (Signed)
Pt states the pharmacy does not have atrovent inhaler called to the pharmacy. Please call pt back.

## 2014-12-26 NOTE — Telephone Encounter (Signed)
Pt had not called the pharmacy, it has been ready since they opened this am

## 2014-12-27 ENCOUNTER — Other Ambulatory Visit: Payer: Self-pay | Admitting: *Deleted

## 2014-12-27 MED ORDER — IPRATROPIUM BROMIDE HFA 17 MCG/ACT IN AERS: INHALATION_SPRAY | RESPIRATORY_TRACT | Status: DC

## 2014-12-27 NOTE — Telephone Encounter (Signed)
Pt states received Proventil from the pharmacy which she no longer takes; states it was discontinued by a female doctor b/c "it is too strong for me". "wasted money". Needs refill on Atrovent today.

## 2014-12-27 NOTE — Telephone Encounter (Signed)
It looks like she has been on both albuterol and atrovent for many years.  Looking back I do not have a great explanation why she is on either of these medications and these is no documentation that albuterol is too strong for her. I can give her a 1 month refill of atrovent but would like her to be seen for this specific complaint and she will likely need PFTs for ongoing prescription of either of these medications. [name] will you call her and let her know this.

## 2014-12-28 NOTE — Telephone Encounter (Signed)
Pt called and informed Atrovent was refilled x 1 month and needs to schedule an appt to discuss need for inhalers; Pt agreed - will have front office to schedule the appt.

## 2015-01-01 ENCOUNTER — Ambulatory Visit (HOSPITAL_COMMUNITY): Admission: RE | Admit: 2015-01-01 | Payer: Medicaid Other | Source: Ambulatory Visit

## 2015-01-08 ENCOUNTER — Inpatient Hospital Stay: Admission: RE | Admit: 2015-01-08 | Payer: Medicaid Other | Source: Ambulatory Visit

## 2015-01-08 ENCOUNTER — Ambulatory Visit: Payer: Medicaid Other

## 2015-01-15 ENCOUNTER — Encounter: Payer: Self-pay | Admitting: Student

## 2015-01-22 ENCOUNTER — Ambulatory Visit (INDEPENDENT_AMBULATORY_CARE_PROVIDER_SITE_OTHER): Payer: Medicaid Other | Admitting: Podiatry

## 2015-01-22 ENCOUNTER — Encounter: Payer: Self-pay | Admitting: Podiatry

## 2015-01-22 DIAGNOSIS — L84 Corns and callosities: Secondary | ICD-10-CM

## 2015-01-22 DIAGNOSIS — E1142 Type 2 diabetes mellitus with diabetic polyneuropathy: Secondary | ICD-10-CM

## 2015-01-22 NOTE — Patient Instructions (Signed)
Diabetes and Foot Care Diabetes may cause you to have problems because of poor blood supply (circulation) to your feet and legs. This may cause the skin on your feet to become thinner, break easier, and heal more slowly. Your skin may become dry, and the skin may peel and crack. You may also have nerve damage in your legs and feet causing decreased feeling in them. You may not notice minor injuries to your feet that could lead to infections or more serious problems. Taking care of your feet is one of the most important things you can do for yourself.  HOME CARE INSTRUCTIONS  Wear shoes at all times, even in the house. Do not go barefoot. Bare feet are easily injured.  Check your feet daily for blisters, cuts, and redness. If you cannot see the bottom of your feet, use a mirror or ask someone for help.  Wash your feet with warm water (do not use hot water) and mild soap. Then pat your feet and the areas between your toes until they are completely dry. Do not soak your feet as this can dry your skin.  Apply a moisturizing lotion or petroleum jelly (that does not contain alcohol and is unscented) to the skin on your feet and to dry, brittle toenails. Do not apply lotion between your toes.  Trim your toenails straight across. Do not dig under them or around the cuticle. File the edges of your nails with an emery board or nail file.  Do not cut corns or calluses or try to remove them with medicine.  Wear clean socks or stockings every day. Make sure they are not too tight. Do not wear knee-high stockings since they may decrease blood flow to your legs.  Wear shoes that fit properly and have enough cushioning. To break in new shoes, wear them for just a few hours a day. This prevents you from injuring your feet. Always look in your shoes before you put them on to be sure there are no objects inside.  Do not cross your legs. This may decrease the blood flow to your feet.  If you find a minor scrape,  cut, or break in the skin on your feet, keep it and the skin around it clean and dry. These areas may be cleansed with mild soap and water. Do not cleanse the area with peroxide, alcohol, or iodine.  When you remove an adhesive bandage, be sure not to damage the skin around it.  If you have a wound, look at it several times a day to make sure it is healing.  Do not use heating pads or hot water bottles. They may burn your skin. If you have lost feeling in your feet or legs, you may not know it is happening until it is too late.  Make sure your health care provider performs a complete foot exam at least annually or more often if you have foot problems. Report any cuts, sores, or bruises to your health care provider immediately. SEEK MEDICAL CARE IF:   You have an injury that is not healing.  You have cuts or breaks in the skin.  You have an ingrown nail.  You notice redness on your legs or feet.  You feel burning or tingling in your legs or feet.  You have pain or cramps in your legs and feet.  Your legs or feet are numb.  Your feet always feel cold. SEEK IMMEDIATE MEDICAL CARE IF:   There is increasing redness,   swelling, or pain in or around a wound.  There is a red line that goes up your leg.  Pus is coming from a wound.  You develop a fever or as directed by your health care provider.  You notice a bad smell coming from an ulcer or wound.   This information is not intended to replace advice given to you by your health care provider. Make sure you discuss any questions you have with your health care provider.   Document Released: 02/07/2000 Document Revised: 10/12/2012 Document Reviewed: 07/19/2012 Elsevier Interactive Patient Education 2016 [employer]  

## 2015-01-22 NOTE — Progress Notes (Signed)
Patient ID: [name], female   DOB: Sep 05, 1950, 64 y.o.   MRN: [id number]  Subjective: This patient presents for scheduled visit complaining of painful plantar calluses on right and left feet. These lesions have a history of ulcerative for pre-ulcerative diabetic lesions. These lesions are chronic and required ongoing debridement at approximately 2-4 week intervals. Patient also undergoing treatment for diabetic neuropathy by her primary care physician  Objective: Orientated 3 Bleeding callus plantar first and fifth MPJ right that remains closed after debridement Non-bleeding callus first and fifth MPJ left and nucleated keratoses left heel  Assessment: Diabetic with a history of neuropathy Pre-ulcerative plantar callus 2 right Plantar keratoses 2 left Porokeratosis 1 left  Plan: Debride all 5 lesions without a bleeding Patient will maintain diabetic shoes with insoles  Reappoint 4 weeks

## 2015-01-23 ENCOUNTER — Encounter: Payer: Self-pay | Admitting: Internal Medicine

## 2015-01-23 ENCOUNTER — Ambulatory Visit (INDEPENDENT_AMBULATORY_CARE_PROVIDER_SITE_OTHER): Payer: Medicaid Other | Admitting: Internal Medicine

## 2015-01-23 VITALS — BP 138/58 | HR 86 | Temp 98.0°F | Ht 65.0 in | Wt 141.8 lb

## 2015-01-23 DIAGNOSIS — I5189 Other ill-defined heart diseases: Secondary | ICD-10-CM

## 2015-01-23 DIAGNOSIS — N907 Vulvar cyst: Secondary | ICD-10-CM

## 2015-01-23 DIAGNOSIS — I351 Nonrheumatic aortic (valve) insufficiency: Secondary | ICD-10-CM | POA: Insufficient documentation

## 2015-01-23 HISTORY — DX: Other ill-defined heart diseases: I51.89

## 2015-01-23 NOTE — Patient Instructions (Signed)
General Instructions: I want you to apply warm compresses to the area 4 times a day.  If it gets worse or your develop fever or chills please call the our office, your ob/gyn or go to the ED as a last resort.  Please bring your medicines with you each time you come to clinic.  Medicines may include prescription medications, over-the-counter medications, herbal remedies, eye drops, vitamins, or other pills.   Progress Toward Treatment Goals:  Treatment Goal 11/29/2014  Hemoglobin A1C unchanged  Blood pressure deteriorated  Stop smoking smoking the same amount    Self Care Goals & Plans:  Self Care Goal 01/23/2015  Manage my medications take my medicines as prescribed; bring my medications to every visit; refill my medications on time; follow the sick day instructions if I am sick  Monitor my health keep track of my blood glucose; keep track of my blood pressure; check my feet daily  Eat healthy foods eat more vegetables; eat fruit for snacks and desserts; eat foods that are low in salt; eat baked foods instead of fried foods; drink diet soda or water instead of juice or soda  Be physically active find an activity I enjoy  Stop smoking -    Home Blood Glucose Monitoring 11/29/2014  Check my blood sugar 3 times a day  When to check my blood sugar before meals     Care Management & Community Referrals:  Referral 03/29/2014  Referrals made for care management support none needed       Bartholin Cyst or Abscess A Bartholin cyst is a fluid-filled sac that forms on a Bartholin gland. Bartholin glands are small glands that are found in the folds of skin (labia) on the sides of the lower opening of the vagina. This type of cyst causes a bulge on the side of the vagina. A cyst that is not large or infected may not cause problems. However, if the fluid in the cyst becomes infected, the cyst can turn into an abscess. An abscess may cause discomfort or pain. HOME CARE  Take medicines only as  told by your doctor.  If you were prescribed an antibiotic medicine, finish all of it even if you start to feel better.  Apply warm, wet compresses to the area or take warm, shallow baths that cover your pelvic area (sitz baths). Do this many times each day or as told by your doctor.  Do not squeeze the cyst. Do not apply heavy pressure to it.  Do not have sex until the cyst has gone away.  If your cyst or abscess was opened by your doctor, a small piece of gauze or a drain may have been placed in the area. That lets the cyst drain. Do not remove the gauze or the drain until your doctor tells you it is okay to do that.  Do not wear tampons. Wear feminine pads as needed for any fluid or blood.  Keep all follow-up visits as told by your doctor. This is important. GET HELP IF:  Your pain, puffiness (swelling), or redness in the area of the cyst gets worse.  You have fluid or pus pus coming from the cyst.  You have a fever.   This information is not intended to replace advice given to you by your health care provider. Make sure you discuss any questions you have with your health care provider.   Document Released: 05/08/2008 Document Revised: 06/26/2014 Document Reviewed: 09/25/2013 Elsevier Interactive Patient Education [employer].

## 2015-01-23 NOTE — Progress Notes (Signed)
[facility] Subjective:   Patient ID: MORRISON MOERMAN female   DOB: 10-Dec-1950 64 y.o.   MRN: [id number]  HPI: Ms.Ambrie C Swiss is a 64 y.o. female with a PMH detailed below who presents for an acute visit for painful swelling at her vagina. She reports she has had a spot at the opening of her vagina that has become painful.  She is not sure exactly when this first appeared. She has not tried any specific treatment.  Of note she did visit her OB/GYN on 10/27 and had an exam at that time, she says she mentioned the spot to the OBGYN but did not hear anything more about it.  She also underwent endometrial biopsy (abnormal uterine bleeding) that was negative for malignacy.  She has a known history of submucosal fibroids.  An Ultrasound was ordered, she was unable to attend due to lack of transportation. She denies any fever or chills.  She does have a long history of hot flashes and sweats.  She has not appreciated any drainage from the area.    Past Medical History  Diagnosis Date  . Diabetes mellitus   . Hypertension   . ANEMIA, CHRONIC DISEASE NEC 03/15/2006  . DEGENERATIVE JOINT DISEASE 05/06/2007    On going for >5 yrs No imaging to confirm Has tried ultram, mobic, neurontin which did not work Ambulance person tried once worked well    . 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 DIABETIC  RETINOPATHY 03/15/2006  . Hyperlipidemia 04/01/2011  . PANCREATITIS, CHRONIC 03/29/2006  . RENAL INSUFFICIENCY, CHRONIC 03/15/2006  . Peripheral arterial disease ([place])   . Hemorrhoids   . Diastolic dysfunction 99991111    Grade 1 by Echocardiogram 12/13/14  . Moderate aortic regurgitation 09/25/2008    12/13/14 Echocardiogram     Current Outpatient Prescriptions  Medication Sig Dispense Refill  . acetaminophen (TYLENOL) 500 MG tablet Take 1,000 mg by mouth every 6 (six) hours as needed for moderate pain.    Marland Kitchen albuterol (PROVENTIL HFA) 108 (90 BASE) MCG/ACT inhaler Inhale 2 puffs into the  lungs every 6 (six) hours as needed for wheezing. 6.7 g 6  . amitriptyline (ELAVIL) 25 MG tablet Take 1 tablet (25 mg total) by mouth at bedtime. 90 tablet 0  . insulin glargine (LANTUS) 100 UNIT/ML injection Inject 10 Units into the skin every morning.     Marland Kitchen ipratropium (ATROVENT HFA) 17 MCG/ACT inhaler inhale 1 to 2 puffs every 6 hours if needed 12.9 g 0  . losartan (COZAAR) 50 MG tablet Take 1 tablet (50 mg total) by mouth daily. 90 tablet 1  . nicotine polacrilex (NICORETTE) 2 MG gum Take 1 each (2 mg total) by mouth as needed for smoking cessation. 100 tablet 1  . ondansetron (ZOFRAN ODT) 4 MG disintegrating tablet Take 1 tablet (4 mg total) by mouth every 8 (eight) hours as needed for nausea or vomiting. 20 tablet 0  . Pancrelipase, Lip-Prot-Amyl, 24000 UNITS CPEP Take 3 tablets With meals and 2 tablets with snacks. No more than 10/24hours (Patient taking differently: Take 48,000-72,000 Units by mouth See admin instructions. Take 3 tablets With meals and 2 tablets with snacks. No more than 10/24hours) 180 capsule 11  . polyethylene glycol (MIRALAX / GLYCOLAX) packet Take 17 g by mouth daily as needed for mild constipation. 14 each 5  . promethazine (PHENERGAN) 12.5 MG tablet Take 1 tablet (12.5 mg total) by mouth every 6 (six) hours as needed for nausea or vomiting. 60 tablet  2  . silver sulfADIAZINE (SILVADENE) 1 % cream Apply 1 application topically daily. (Patient taking differently: Apply 1 application topically daily as needed (for wound). ) 50 g 0  . sitaGLIPtin (JANUVIA) 50 MG tablet Take 1 tablet (50 mg total) by mouth daily. 30 tablet 2  . traMADol (ULTRAM) 50 MG tablet Take 1 tablet (50 mg total) by mouth every 8 (eight) hours as needed for moderate pain. 60 tablet 2  . travoprost, benzalkonium, (TRAVATAN) 0.004 % ophthalmic solution Place 1 drop into both eyes at bedtime.     No current facility-administered medications for this visit.   No family history on file. Social History    Social History  . Marital Status: Single    Spouse Name: N/A  . Number of Children: N/A  . Years of Education: N/A   Social History Main Topics  . Smoking status: Current Some Day Smoker -- 0.50 packs/day    Types: Cigarettes  . Smokeless tobacco: Never Used     Comment: 3 -6 cigs  . Alcohol Use: No  . Drug Use: No  . Sexual Activity: Not Asked   Other Topics Concern  . None   Social History Narrative   Review of Systems: Review of Systems  Constitutional: Negative for fever and chills.  Gastrointestinal: Negative for abdominal pain.  Genitourinary: Negative for dysuria, urgency and frequency.     Objective:  Physical Exam: Filed Vitals:   01/23/15 0924  BP: 138/58  Pulse: 86  Temp: 98 F (36.7 C)  TempSrc: Oral  Height: 5\' 5"  (1.651 m)  Weight: 141 lb 12.8 oz (64.32 kg)  SpO2: 58%  Physical Exam  Constitutional: She is well-developed, well-nourished, and in no distress.  Genitourinary:  Debra RN, chaperone She has a 1.5cm tender nodule at the 10 oclock position of her labia majora, no active drainage.  Nursing note and vitals reviewed.    Assessment & Plan:  Case discussed with Dr. 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  She has no fever or chills. - Non invasive treatment with warm compresses and sitz baths. - If develops fever/ chills/ drainage she will call and return either here or to her OBGYN.    Medications Ordered No orders of the defined types were placed in this encounter.   Other Orders No orders of the defined types were placed in this encounter.   Follow Up: Return in about 2 months (around 03/25/2015), or if symptoms worsen or fail to improve.

## 2015-01-27 NOTE — Assessment & Plan Note (Signed)
She appears to have a vulvar cyst versus bartholin gland cyst (more anterior position than I would expect for bartholin gland).  She has no fever or chills. - Non invasive treatment with warm compresses and sitz baths. - If develops fever/ chills/ drainage she will call and return either here or to her OBGYN.

## 2015-01-28 NOTE — Progress Notes (Signed)
Internal Medicine Clinic Attending  Case discussed with Dr. Hoffman soon after the resident saw the patient.  We reviewed the resident's history and exam and pertinent patient test results.  I agree with the assessment, diagnosis, and plan of care documented in the resident's note. 

## 2015-02-13 ENCOUNTER — Other Ambulatory Visit: Payer: Self-pay | Admitting: Internal Medicine

## 2015-02-13 NOTE — Telephone Encounter (Signed)
Patient requesting a tramadol refill

## 2015-02-14 MED ORDER — TRAMADOL HCL 50 MG PO TABS: 50.0000 mg | ORAL_TABLET | Freq: Three times a day (TID) | ORAL | Status: DC | PRN

## 2015-02-14 NOTE — Telephone Encounter (Signed)
Rx called in to pharmacy and pt already has an appt with Dr Heber [place] 03/07/15. Talked with pt about appt.

## 2015-02-14 NOTE — Telephone Encounter (Signed)
Needs appointment to discuss further and possibly sign pain contract

## 2015-02-19 ENCOUNTER — Ambulatory Visit (INDEPENDENT_AMBULATORY_CARE_PROVIDER_SITE_OTHER): Payer: Medicaid Other | Admitting: Podiatry

## 2015-02-19 VITALS — BP 146/70 | HR 86 | Resp 12

## 2015-02-19 DIAGNOSIS — E1142 Type 2 diabetes mellitus with diabetic polyneuropathy: Secondary | ICD-10-CM | POA: Diagnosis not present

## 2015-02-19 DIAGNOSIS — L97501 Non-pressure chronic ulcer of other part of unspecified foot limited to breakdown of skin: Secondary | ICD-10-CM | POA: Diagnosis not present

## 2015-02-19 DIAGNOSIS — L84 Corns and callosities: Secondary | ICD-10-CM

## 2015-02-19 NOTE — Patient Instructions (Signed)
Apply Silvadene cream to the skin also the bottom of your right foot daily until drainage and ends  Diabetes and Foot Care Diabetes may cause you to have problems because of poor blood supply (circulation) to your feet and legs. This may cause the skin on your feet to become thinner, break easier, and heal more slowly. Your skin may become dry, and the skin may peel and crack. You may also have nerve damage in your legs and feet causing decreased feeling in them. You may not notice minor injuries to your feet that could lead to infections or more serious problems. Taking care of your feet is one of the most important things you can do for yourself.  HOME CARE INSTRUCTIONS  Wear shoes at all times, even in the house. Do not go barefoot. Bare feet are easily injured.  Check your feet daily for blisters, cuts, and redness. If you cannot see the bottom of your feet, use a mirror or ask someone for help.  Wash your feet with warm water (do not use hot water) and mild soap. Then pat your feet and the areas between your toes until they are completely dry. Do not soak your feet as this can dry your skin.  Apply a moisturizing lotion or petroleum jelly (that does not contain alcohol and is unscented) to the skin on your feet and to dry, brittle toenails. Do not apply lotion between your toes.  Trim your toenails straight across. Do not dig under them or around the cuticle. File the edges of your nails with an emery board or nail file.  Do not cut corns or calluses or try to remove them with medicine.  Wear clean socks or stockings every day. Make sure they are not too tight. Do not wear knee-high stockings since they may decrease blood flow to your legs.  Wear shoes that fit properly and have enough cushioning. To break in new shoes, wear them for just a few hours a day. This prevents you from injuring your feet. Always look in your shoes before you put them on to be sure there are no objects  inside.  Do not cross your legs. This may decrease the blood flow to your feet.  If you find a minor scrape, cut, or break in the skin on your feet, keep it and the skin around it clean and dry. These areas may be cleansed with mild soap and water. Do not cleanse the area with peroxide, alcohol, or iodine.  When you remove an adhesive bandage, be sure not to damage the skin around it.  If you have a wound, look at it several times a day to make sure it is healing.  Do not use heating pads or hot water bottles. They may burn your skin. If you have lost feeling in your feet or legs, you may not know it is happening until it is too late.  Make sure your health care provider performs a complete foot exam at least annually or more often if you have foot problems. Report any cuts, sores, or bruises to your health care provider immediately. SEEK MEDICAL CARE IF:   You have an injury that is not healing.  You have cuts or breaks in the skin.  You have an ingrown nail.  You notice redness on your legs or feet.  You feel burning or tingling in your legs or feet.  You have pain or cramps in your legs and feet.  Your legs or feet  are numb.  Your feet always feel cold. SEEK IMMEDIATE MEDICAL CARE IF:   There is increasing redness, swelling, or pain in or around a wound.  There is a red line that goes up your leg.  Pus is coming from a wound.  You develop a fever or as directed by your health care provider.  You notice a bad smell coming from an ulcer or wound.   This information is not intended to replace advice given to you by your health care provider. Make sure you discuss any questions you have with your health care provider.   Document Released: 02/07/2000 Document Revised: 10/12/2012 Document Reviewed: 07/19/2012 Elsevier Interactive Patient Education [employer].

## 2015-02-19 NOTE — Progress Notes (Signed)
Patient ID: [name], female   DOB: Jul 10, 1950, 64 y.o.   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 There is no surrounding erythema or edema Bleeding callus first MPJ right, first MPJ left, fifth MPJ left Nucleated keratoses left heel  Assessment: Superficial ulceration fifth MPJ right Pre-ulcerative calluses 3 Diabetic peripheral neuropathy  Plan: Debrided ulcer fifth MPJ right and apply Silvadene dressing. Patient will continue apply Silvadene dressing until healed Debrided pre-ulcerative calluses 3 \ Reappoint 4 weeks

## 2015-03-06 ENCOUNTER — Telehealth: Payer: Self-pay | Admitting: Dietician

## 2015-03-06 NOTE — Telephone Encounter (Signed)
Called patient to find out if she saw Dr. Jola Schmidt on 01-11-2015. She says she did and she is due back in 4 months. She is unsure if she already has an appointment.  [facility], she did not have a follow up appointment, they will work on faxing the report to [phone]

## 2015-03-07 ENCOUNTER — Encounter: Payer: Self-pay | Admitting: Internal Medicine

## 2015-03-07 ENCOUNTER — Encounter: Payer: Medicaid Other | Admitting: Internal Medicine

## 2015-03-08 LAB — HM DIABETES EYE EXAM

## 2015-03-14 ENCOUNTER — Ambulatory Visit (INDEPENDENT_AMBULATORY_CARE_PROVIDER_SITE_OTHER): Payer: Medicaid Other | Admitting: Internal Medicine

## 2015-03-14 ENCOUNTER — Encounter: Payer: Self-pay | Admitting: Internal Medicine

## 2015-03-14 VITALS — BP 167/71 | HR 72 | Temp 98.1°F | Ht 65.0 in | Wt 141.3 lb

## 2015-03-14 DIAGNOSIS — Z9189 Other specified personal risk factors, not elsewhere classified: Secondary | ICD-10-CM | POA: Insufficient documentation

## 2015-03-14 DIAGNOSIS — M5136 Other intervertebral disc degeneration, lumbar region: Secondary | ICD-10-CM

## 2015-03-14 DIAGNOSIS — I129 Hypertensive chronic kidney disease with stage 1 through stage 4 chronic kidney disease, or unspecified chronic kidney disease: Secondary | ICD-10-CM

## 2015-03-14 DIAGNOSIS — M159 Polyosteoarthritis, unspecified: Secondary | ICD-10-CM

## 2015-03-14 DIAGNOSIS — E1122 Type 2 diabetes mellitus with diabetic chronic kidney disease: Secondary | ICD-10-CM | POA: Diagnosis present

## 2015-03-14 DIAGNOSIS — M545 Low back pain, unspecified: Secondary | ICD-10-CM

## 2015-03-14 DIAGNOSIS — R634 Abnormal weight loss: Secondary | ICD-10-CM

## 2015-03-14 DIAGNOSIS — N183 Chronic kidney disease, stage 3 unspecified: Secondary | ICD-10-CM

## 2015-03-14 DIAGNOSIS — M150 Primary generalized (osteo)arthritis: Secondary | ICD-10-CM

## 2015-03-14 DIAGNOSIS — G8929 Other chronic pain: Secondary | ICD-10-CM

## 2015-03-14 DIAGNOSIS — Z794 Long term (current) use of insulin: Secondary | ICD-10-CM

## 2015-03-14 DIAGNOSIS — E11319 Type 2 diabetes mellitus with unspecified diabetic retinopathy without macular edema: Secondary | ICD-10-CM

## 2015-03-14 DIAGNOSIS — H409 Unspecified glaucoma: Secondary | ICD-10-CM

## 2015-03-14 DIAGNOSIS — Z7984 Long term (current) use of oral hypoglycemic drugs: Secondary | ICD-10-CM | POA: Diagnosis not present

## 2015-03-14 DIAGNOSIS — I10 Essential (primary) hypertension: Secondary | ICD-10-CM

## 2015-03-14 LAB — GLUCOSE, CAPILLARY: GLUCOSE-CAPILLARY: 141 mg/dL — AB (ref 65–99)

## 2015-03-14 LAB — POCT GLYCOSYLATED HEMOGLOBIN (HGB A1C): HEMOGLOBIN A1C: 7

## 2015-03-14 MED ORDER — HYDROCODONE-ACETAMINOPHEN 5-325 MG PO TABS: 1.0000 | ORAL_TABLET | Freq: Four times a day (QID) | ORAL | Status: DC | PRN

## 2015-03-14 MED ORDER — ATORVASTATIN CALCIUM 40 MG PO TABS: 40.0000 mg | ORAL_TABLET | Freq: Every day | ORAL | Status: DC

## 2015-03-14 NOTE — Patient Instructions (Signed)
General Instructions:  I want you to start taking Atrovastatin 40mg  a day to reduce your risk of heart attack or stroke.    Please bring your medicines with you each time you come to clinic.  Medicines may include prescription medications, over-the-counter medications, herbal remedies, eye drops, vitamins, or other pills.   Progress Toward Treatment Goals:  Treatment Goal 11/29/2014  Hemoglobin A1C unchanged  Blood pressure deteriorated  Stop smoking smoking the same amount    Self Care Goals & Plans:  Self Care Goal 03/14/2015  Manage my medications take my medicines as prescribed; bring my medications to every visit; refill my medications on time  Monitor my health keep track of my blood glucose; bring my glucose meter and log to each visit  Eat healthy foods eat more vegetables; eat foods that are low in salt; eat baked foods instead of fried foods  Be physically active find an activity I enjoy; take a walk every day  Stop smoking -    Home Blood Glucose Monitoring 11/29/2014  Check my blood sugar 3 times a day  When to check my blood sugar before meals     Care Management & Community Referrals:  Referral 03/29/2014  Referrals made for care management support none needed     Atorvastatin tablets What is this medicine? ATORVASTATIN (a TORE va sta tin) is known as a HMG-CoA reductase inhibitor or 'statin'. It lowers the level of cholesterol and triglycerides in the blood. This drug may also reduce the risk of heart attack, stroke, or other health problems in patients with risk factors for heart disease. Diet and lifestyle changes are often used with this drug. This medicine may be used for other purposes; ask your health care provider or pharmacist if you have questions. What should I tell my health care provider before I take this medicine? They need to know if you have any of these conditions: -frequently drink alcoholic beverages -history of stroke, TIA -kidney  disease -liver disease -muscle aches or weakness -other medical condition -an unusual or allergic reaction to atorvastatin, other medicines, foods, dyes, or preservatives -pregnant or trying to get pregnant -breast-feeding How should I use this medicine? Take this medicine by mouth with a glass of water. Follow the directions on the prescription label. You can take this medicine with or without food. Take your doses at regular intervals. Do not take your medicine more often than directed. Talk to your pediatrician regarding the use of this medicine in children. While this drug may be prescribed for children as young as 33 years old for selected conditions, precautions do apply. Overdosage: If you think you have taken too much of this medicine contact a poison control center or emergency room at once. NOTE: This medicine is only for you. Do not share this medicine with others. What if I miss a dose? If you miss a dose, take it as soon as you can. If it is almost time for your next dose, take only that dose. Do not take double or extra doses. What may interact with this medicine? Do not take this medicine with any of the following medications: -red yeast rice -telaprevir -telithromycin -voriconazole This medicine may also interact with the following medications: -alcohol -antiviral medicines for HIV or AIDS -boceprevir -certain antibiotics like clarithromycin, erythromycin, troleandomycin -certain medicines for cholesterol like fenofibrate or gemfibrozil -cimetidine -clarithromycin -colchicine -cyclosporine -digoxin -female hormones, like estrogens or progestins and birth control pills -grapefruit juice -medicines for fungal infections like fluconazole, itraconazole, ketoconazole -  niacin -rifampin -spironolactone This list may not describe all possible interactions. Give your health care provider a list of all the medicines, herbs, non-prescription drugs, or dietary supplements you  use. Also tell them if you smoke, drink alcohol, or use illegal drugs. Some items may interact with your medicine. What should I watch for while using this medicine? Visit your doctor or health care professional for regular check-ups. You may need regular tests to make sure your liver is working properly. Tell your doctor or health care professional right away if you get any unexplained muscle pain, tenderness, or weakness, especially if you also have a fever and tiredness. Your doctor or health care professional may tell you to stop taking this medicine if you develop muscle problems. If your muscle problems do not go away after stopping this medicine, contact your health care professional. This drug is only part of a total heart-health program. Your doctor or a dietician can suggest a low-cholesterol and low-fat diet to help. Avoid alcohol and smoking, and keep a proper exercise schedule. Do not use this drug if you are pregnant or breast-feeding. Serious side effects to an unborn child or to an infant are possible. Talk to your doctor or pharmacist for more information. This medicine may affect blood sugar levels. If you have diabetes, check with your doctor or health care professional before you change your diet or the dose of your diabetic medicine. If you are going to have surgery tell your health care professional that you are taking this drug. What side effects may I notice from receiving this medicine? Side effects that you should report to your doctor or health care professional as soon as possible: -allergic reactions like skin rash, itching or hives, swelling of the face, lips, or tongue -dark urine -fever -joint pain -muscle cramps, pain -redness, blistering, peeling or loosening of the skin, including inside the mouth -trouble passing urine or change in the amount of urine -unusually weak or tired -yellowing of eyes or skin Side effects that usually do not require medical attention  (report to your doctor or health care professional if they continue or are bothersome): -constipation -heartburn -stomach gas, pain, upset This list may not describe all possible side effects. Call your doctor for medical advice about side effects. You may report side effects to FDA at 1-800-FDA-1088. Where should I keep my medicine? Keep out of the reach of children. Store at room temperature between 20 to 25 degrees C (68 to 77 degrees F). Throw away any unused medicine after the expiration date. NOTE: This sheet is a summary. It may not cover all possible information. If you have questions about this medicine, talk to your doctor, pharmacist, or health care provider.    2016, Elsevier/Gold Standard. (2010-12-30 FT:[id number])

## 2015-03-15 NOTE — Assessment & Plan Note (Signed)
A: At risk for CAD, I calculated her ASCVD to be 32.7% despite her lipid panel results  P: -Discussed the benefits of statin therapy - Prescribed Atorvastatin 40mg  daily

## 2015-03-15 NOTE — Progress Notes (Signed)
[name] Subjective:   Patient ID: [name] female   DOB: 1950-06-01 65 y.o.   MRN: [id number]  HPI: [name] is a 65 y.o. female with a PMH detailed below who presents for 3 month follow up of DM and HTN.  Please see problem based charting below for the status of her chronic medical problems.    Past Medical History  Diagnosis Date  . Diabetes mellitus   . Hypertension   . ANEMIA, CHRONIC DISEASE NEC 03/15/2006  . DEGENERATIVE JOINT DISEASE 05/06/2007    On going for >5 yrs No imaging to confirm Has tried ultram, mobic, neurontin which did not work Ambulance person tried once worked well    . 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 DIABETIC  RETINOPATHY 03/15/2006  . Hyperlipidemia 04/01/2011  . PANCREATITIS, CHRONIC 03/29/2006  . RENAL INSUFFICIENCY, CHRONIC 03/15/2006  . Peripheral arterial disease ([place])   . Hemorrhoids   . Diastolic dysfunction 99991111    Grade 1 by Echocardiogram 12/13/14  . Moderate aortic regurgitation 09/25/2008    12/13/14 Echocardiogram     Current Outpatient Prescriptions  Medication Sig Dispense Refill  . acetaminophen (TYLENOL) 500 MG tablet Take 1,000 mg by mouth every 6 (six) hours as needed for moderate pain.    Marland Kitchen albuterol (PROVENTIL HFA) 108 (90 BASE) MCG/ACT inhaler Inhale 2 puffs into the lungs every 6 (six) hours as needed for wheezing. 6.7 g 6  . amitriptyline (ELAVIL) 25 MG tablet Take 1 tablet (25 mg total) by mouth at bedtime. 90 tablet 0  . atorvastatin (LIPITOR) 40 MG tablet Take 1 tablet (40 mg total) by mouth daily. 90 tablet 3  . HYDROcodone-acetaminophen (NORCO) 5-325 MG tablet Take 1 tablet by mouth every 6 (six) hours as needed for moderate pain. 60 tablet 0  . insulin glargine (LANTUS) 100 UNIT/ML injection Inject 10 Units into the skin every morning.     Marland Kitchen ipratropium (ATROVENT HFA) 17 MCG/ACT inhaler inhale 1 to 2 puffs every 6 hours if needed 12.9 g 0  . losartan (COZAAR) 50 MG tablet Take 1  tablet (50 mg total) by mouth daily. 90 tablet 1  . nicotine polacrilex (NICORETTE) 2 MG gum Take 1 each (2 mg total) by mouth as needed for smoking cessation. 100 tablet 1  . ondansetron (ZOFRAN ODT) 4 MG disintegrating tablet Take 1 tablet (4 mg total) by mouth every 8 (eight) hours as needed for nausea or vomiting. 20 tablet 0  . Pancrelipase, Lip-Prot-Amyl, 24000 UNITS CPEP Take 3 tablets With meals and 2 tablets with snacks. No more than 10/24hours (Patient taking differently: Take 48,000-72,000 Units by mouth See admin instructions. Take 3 tablets With meals and 2 tablets with snacks. No more than 10/24hours) 180 capsule 11  . polyethylene glycol (MIRALAX / GLYCOLAX) packet Take 17 g by mouth daily as needed for mild constipation. 14 each 5  . promethazine (PHENERGAN) 12.5 MG tablet Take 1 tablet (12.5 mg total) by mouth every 6 (six) hours as needed for nausea or vomiting. 60 tablet 2  . silver sulfADIAZINE (SILVADENE) 1 % cream Apply 1 application topically daily. (Patient taking differently: Apply 1 application topically daily as needed (for wound). ) 50 g 0  . sitaGLIPtin (JANUVIA) 50 MG tablet Take 1 tablet (50 mg total) by mouth daily. 30 tablet 2  . travoprost, benzalkonium, (TRAVATAN) 0.004 % ophthalmic solution Place 1 drop into both eyes at bedtime.     No current facility-administered medications for this visit.  No family history on file. Social History   Social History  . Marital Status: Single    Spouse Name: N/A  . Number of Children: N/A  . Years of Education: N/A   Social History Main Topics  . Smoking status: Current Some Day Smoker -- 0.50 packs/day    Types: Cigarettes  . Smokeless tobacco: Never Used     Comment: 3 -6 cigs  . Alcohol Use: No  . Drug Use: No  . Sexual Activity: Not Asked   Other Topics Concern  . None   Social History Narrative   Review of Systems: Review of Systems  Constitutional: Negative for fever, chills and weight loss.   Cardiovascular: Negative for chest pain.  Gastrointestinal: Negative for abdominal pain.  Musculoskeletal: Positive for joint pain.     Objective:  Physical Exam: Filed Vitals:   03/14/15 1511  BP: 167/71  Pulse: 72  Temp: 98.1 F (36.7 C)  TempSrc: Oral  Height: 5\' 5"  (1.651 m)  Weight: 141 lb 4.8 oz (64.093 kg)  SpO2: 100%   Physical Exam  Constitutional: She is well-developed, well-nourished, and in no distress.  Cardiovascular: Normal rate and regular rhythm.   Pulmonary/Chest: Effort normal and breath sounds normal.  Abdominal: Soft. Bowel sounds are normal.  Nursing note and vitals reviewed.   Assessment & Plan:  Case discussed with Dr. Daryll Drown  Essential hypertension HPI: reports she has beentaking her bp medications without issue, BP has been well controlled at home  A: Essential hypertension Not at goal  P:  BP well controleld at previous visit and at home - Continue Losartan 50mg  daily, may need to increase if remains uncontrolled.  Type 2 diabetes mellitus, controlled, with renal complications (HCC) HPI: Reports she has been doing very well with her diabetes. Her weight is stable and she has had no hypoglycemia.  A: T2DM controlled with CKD3.  P: - Continue metformin, januvia, and 10 units of Lantus dialy  Osteoarthritis HPI: she continues to reports chronic pain in multiple joints,  The occasional use of tramadol has been effective in treating this suspected OA.  However she reports this makes her nauseous and wants something different.  A: Osteoarthritis  P: I have told her that i suspect the nausea is a class effect but she wants to try a different medication.  I will try a low dose of norco 5-325mg  #60 for her to see if this helps her functionally.  If this is to become long term we will need a pain agreement and better define her goals of therapy. - Discussed to keep total Tylenol dose to 3000mg /day F/U in 2 months  Risk for coronary artery  disease greater than 20% in next 10 years A: At risk for CAD, I calculated her ASCVD to be 32.7% despite her lipid panel results  P: -Discussed the benefits of statin therapy - Prescribed Atorvastatin 40mg  daily  Back pain - See HPI note of OA.  A: DDD of lumber spine  P: Change tramadol to Norco  Loss of weight HPI: she was concerned last year about weight loss and cough, we ordered a CXR and this was never competed  A: Weight loss, suspect due to decreased lantus dose  P: Her weight has been stable in the last year at a normal BMI,  I believe this is a healthy weight and have told he this.  Her cough has resolved and I do not feel she needs an CXR at this time.  Medications Ordered Meds ordered this encounter  Medications  . DISCONTD: HYDROcodone-acetaminophen (NORCO) 5-325 MG tablet    Sig: Take 1 tablet by mouth every 6 (six) hours as needed for moderate pain.    Dispense:  60 tablet    Refill:  0    Rx 1/2  . HYDROcodone-acetaminophen (NORCO) 5-325 MG tablet    Sig: Take 1 tablet by mouth every 6 (six) hours as needed for moderate pain.    Dispense:  60 tablet    Refill:  0    Rx 2/2 to be filled 30 days after previous script  . atorvastatin (LIPITOR) 40 MG tablet    Sig: Take 1 tablet (40 mg total) by mouth daily.    Dispense:  90 tablet    Refill:  3   Other Orders Orders Placed This Encounter  Procedures  . Glucose, capillary  . POCT glycosylated hemoglobin (Hb A1C)   Follow Up: Return in about 2 months (around 05/12/2015).

## 2015-03-15 NOTE — Assessment & Plan Note (Signed)
-   See HPI note of OA.  A: DDD of lumber spine  P: Change tramadol to Norco

## 2015-03-15 NOTE — Assessment & Plan Note (Signed)
HPI: reports she has beentaking her bp medications without issue, BP has been well controlled at home  A: Essential hypertension Not at goal  P:  BP well controleld at previous visit and at home - Continue Losartan 50mg  daily, may need to increase if remains uncontrolled.

## 2015-03-15 NOTE — Assessment & Plan Note (Signed)
HPI: Reports she has been doing very well with her diabetes. Her weight is stable and she has had no hypoglycemia.  A: T2DM controlled with CKD3.  P: - Continue metformin, januvia, and 10 units of Lantus dialy

## 2015-03-15 NOTE — Assessment & Plan Note (Addendum)
HPI: she continues to reports chronic pain in multiple joints,  The occasional use of tramadol has been effective in treating this suspected OA.  However she reports this makes her nauseous and wants something different.  A: Osteoarthritis  P: I have told her that i suspect the nausea is a class effect but she wants to try a different medication.  I will try a low dose of norco 5-325mg  #60 for her to see if this helps her functionally.  If this is to become long term we will need a pain agreement and better define her goals of therapy. - Discussed to keep total Tylenol dose to 3000mg /day F/U in 2 months

## 2015-03-15 NOTE — Assessment & Plan Note (Signed)
HPI: she was concerned last year about weight loss and cough, we ordered a CXR and this was never competed  A: Weight loss, suspect due to decreased lantus dose  P: Her weight has been stable in the last year at a normal BMI,  I believe this is a healthy weight and have told he this.  Her cough has resolved and I do not feel she needs an CXR at this time.

## 2015-03-18 DIAGNOSIS — H409 Unspecified glaucoma: Secondary | ICD-10-CM | POA: Insufficient documentation

## 2015-03-18 NOTE — Addendum Note (Signed)
Addended by: Lucious Groves on: 03/18/2015 03:44 PM   Modules accepted: Orders

## 2015-03-18 NOTE — Progress Notes (Signed)
Internal Medicine Clinic Attending  Case discussed with Dr. Hoffman soon after the resident saw the patient.  We reviewed the resident's history and exam and pertinent patient test results.  I agree with the assessment, diagnosis, and plan of care documented in the resident's note. 

## 2015-03-18 NOTE — Addendum Note (Signed)
Addended by: Gilles Chiquito B on: 03/18/2015 03:18 PM   Modules accepted: Level of Service

## 2015-03-20 ENCOUNTER — Encounter: Payer: Self-pay | Admitting: Podiatry

## 2015-03-20 ENCOUNTER — Ambulatory Visit (INDEPENDENT_AMBULATORY_CARE_PROVIDER_SITE_OTHER): Payer: Medicaid Other | Admitting: Podiatry

## 2015-03-20 DIAGNOSIS — E1142 Type 2 diabetes mellitus with diabetic polyneuropathy: Secondary | ICD-10-CM

## 2015-03-20 DIAGNOSIS — B351 Tinea unguium: Secondary | ICD-10-CM

## 2015-03-20 DIAGNOSIS — L97501 Non-pressure chronic ulcer of other part of unspecified foot limited to breakdown of skin: Secondary | ICD-10-CM

## 2015-03-20 DIAGNOSIS — M79676 Pain in unspecified toe(s): Secondary | ICD-10-CM | POA: Diagnosis not present

## 2015-03-20 NOTE — Patient Instructions (Signed)
Diabetes and Foot Care Diabetes may cause you to have problems because of poor blood supply (circulation) to your feet and legs. This may cause the skin on your feet to become thinner, break easier, and heal more slowly. Your skin may become dry, and the skin may peel and crack. You may also have nerve damage in your legs and feet causing decreased feeling in them. You may not notice minor injuries to your feet that could lead to infections or more serious problems. Taking care of your feet is one of the most important things you can do for yourself.  HOME CARE INSTRUCTIONS  Wear shoes at all times, even in the house. Do not go barefoot. Bare feet are easily injured.  Check your feet daily for blisters, cuts, and redness. If you cannot see the bottom of your feet, use a mirror or ask someone for help.  Wash your feet with warm water (do not use hot water) and mild soap. Then pat your feet and the areas between your toes until they are completely dry. Do not soak your feet as this can dry your skin.  Apply a moisturizing lotion or petroleum jelly (that does not contain alcohol and is unscented) to the skin on your feet and to dry, brittle toenails. Do not apply lotion between your toes.  Trim your toenails straight across. Do not dig under them or around the cuticle. File the edges of your nails with an emery board or nail file.  Do not cut corns or calluses or try to remove them with medicine.  Wear clean socks or stockings every day. Make sure they are not too tight. Do not wear knee-high stockings since they may decrease blood flow to your legs.  Wear shoes that fit properly and have enough cushioning. To break in new shoes, wear them for just a few hours a day. This prevents you from injuring your feet. Always look in your shoes before you put them on to be sure there are no objects inside.  Do not cross your legs. This may decrease the blood flow to your feet.  If you find a minor scrape,  cut, or break in the skin on your feet, keep it and the skin around it clean and dry. These areas may be cleansed with mild soap and water. Do not cleanse the area with peroxide, alcohol, or iodine.  When you remove an adhesive bandage, be sure not to damage the skin around it.  If you have a wound, look at it several times a day to make sure it is healing.  Do not use heating pads or hot water bottles. They may burn your skin. If you have lost feeling in your feet or legs, you may not know it is happening until it is too late.  Make sure your health care provider performs a complete foot exam at least annually or more often if you have foot problems. Report any cuts, sores, or bruises to your health care provider immediately. SEEK MEDICAL CARE IF:   You have an injury that is not healing.  You have cuts or breaks in the skin.  You have an ingrown nail.  You notice redness on your legs or feet.  You feel burning or tingling in your legs or feet.  You have pain or cramps in your legs and feet.  Your legs or feet are numb.  Your feet always feel cold. SEEK IMMEDIATE MEDICAL CARE IF:   There is increasing redness,   swelling, or pain in or around a wound.  There is a red line that goes up your leg.  Pus is coming from a wound.  You develop a fever or as directed by your health care provider.  You notice a bad smell coming from an ulcer or wound.   This information is not intended to replace advice given to you by your health care provider. Make sure you discuss any questions you have with your health care provider.   Document Released: 02/07/2000 Document Revised: 10/12/2012 Document Reviewed: 07/19/2012 Elsevier Interactive Patient Education 2016 [employer]  

## 2015-03-21 NOTE — Progress Notes (Signed)
Patient ID: [name], female   DOB: Aug 03, 1950, 65 y.o.   MRN: [id number]   Subjective: This patient presents today for ongoing management of pre-ulcerative in ulcerative plantar calluses right and left feet. These lesions are associated with peripheral neuropathy and diabetes. Patient today is also complaining of uncomfortable toenails walking wearing shoes and requests toenail debridement  Objective: The toenails are elongated, brittle, deformed, hypertrophic and tender to direct palpation 6-10 Plantar fifth right MPJ has large callus that breaks down into a superficial ulcer with a granular base. No surrounding erythema edema Bleeding callus first MPJ bilaterally and fifth MPJ left Nucleated keratoses left heel  Assessment: Superficial ulceration fifth MPJ right Pre-ulcerative plantar callus 3 Diabetic peripheral neuropathy Symptomatic onychomycoses 6-10  Plan: Debrided ulcer sites and pre-ulcerative sites right and left. Silvadene applied fifth right MPJ area and patient will continue to apply this until closed Debride pre-ulcerative calluses 3 Debridement toenails 6-10 mechanically and electronically without any bleeding  Reappoint 4 weeks

## 2015-03-29 ENCOUNTER — Encounter: Payer: Self-pay | Admitting: Internal Medicine

## 2015-03-29 DIAGNOSIS — H4010X0 Unspecified open-angle glaucoma, stage unspecified: Secondary | ICD-10-CM | POA: Insufficient documentation

## 2015-04-04 ENCOUNTER — Encounter: Payer: Self-pay | Admitting: *Deleted

## 2015-04-17 ENCOUNTER — Encounter: Payer: Self-pay | Admitting: Podiatry

## 2015-04-17 ENCOUNTER — Ambulatory Visit (INDEPENDENT_AMBULATORY_CARE_PROVIDER_SITE_OTHER): Payer: Medicaid Other | Admitting: Podiatry

## 2015-04-17 DIAGNOSIS — L84 Corns and callosities: Secondary | ICD-10-CM

## 2015-04-17 DIAGNOSIS — E1142 Type 2 diabetes mellitus with diabetic polyneuropathy: Secondary | ICD-10-CM

## 2015-04-17 DIAGNOSIS — L97501 Non-pressure chronic ulcer of other part of unspecified foot limited to breakdown of skin: Secondary | ICD-10-CM

## 2015-04-17 NOTE — Progress Notes (Signed)
Patient ID: [name], female   DOB: July 30, 1950, 65 y.o.   MRN: [id number]  Subjective: This patient presents for scheduled visit for maintenance and of pre-ulcerative ulcerative plantar keratoses on the right and left feet associated with diabetic peripheral neuropathy  Objective: Orientated 3 Bleeding callus sub-fifth right MPJ that breaks down to a 5 mm superficial ulcer. There is no surrounding erythema or edema Bleeding callus first MPJ right, first MPJ left, fifth MPJ left Nucleated keratoses left heel  Assessment: Superficial ulceration fifth MPJ right Pre-ulcerative calluses 3 Diabetic peripheral neuropathy  Plan: Debrided ulcer fifth MPJ right and apply Silvadene dressing. Patient will continue apply Silvadene dressing until healed Debrided pre-ulcerative calluses 3 \ Reappoint 4 weeks

## 2015-04-17 NOTE — Patient Instructions (Signed)
If there is any drainage apply Silvadene cream to the skin ulcer on the bottom of the right foot at the base of the small toe  Diabetes and Foot Care Diabetes may cause you to have problems because of poor blood supply (circulation) to your feet and legs. This may cause the skin on your feet to become thinner, break easier, and heal more slowly. Your skin may become dry, and the skin may peel and crack. You may also have nerve damage in your legs and feet causing decreased feeling in them. You may not notice minor injuries to your feet that could lead to infections or more serious problems. Taking care of your feet is one of the most important things you can do for yourself.  HOME CARE INSTRUCTIONS  Wear shoes at all times, even in the house. Do not go barefoot. Bare feet are easily injured.  Check your feet daily for blisters, cuts, and redness. If you cannot see the bottom of your feet, use a mirror or ask someone for help.  Wash your feet with warm water (do not use hot water) and mild soap. Then pat your feet and the areas between your toes until they are completely dry. Do not soak your feet as this can dry your skin.  Apply a moisturizing lotion or petroleum jelly (that does not contain alcohol and is unscented) to the skin on your feet and to dry, brittle toenails. Do not apply lotion between your toes.  Trim your toenails straight across. Do not dig under them or around the cuticle. File the edges of your nails with an emery board or nail file.  Do not cut corns or calluses or try to remove them with medicine.  Wear clean socks or stockings every day. Make sure they are not too tight. Do not wear knee-high stockings since they may decrease blood flow to your legs.  Wear shoes that fit properly and have enough cushioning. To break in new shoes, wear them for just a few hours a day. This prevents you from injuring your feet. Always look in your shoes before you put them on to be sure there  are no objects inside.  Do not cross your legs. This may decrease the blood flow to your feet.  If you find a minor scrape, cut, or break in the skin on your feet, keep it and the skin around it clean and dry. These areas may be cleansed with mild soap and water. Do not cleanse the area with peroxide, alcohol, or iodine.  When you remove an adhesive bandage, be sure not to damage the skin around it.  If you have a wound, look at it several times a day to make sure it is healing.  Do not use heating pads or hot water bottles. They may burn your skin. If you have lost feeling in your feet or legs, you may not know it is happening until it is too late.  Make sure your health care provider performs a complete foot exam at least annually or more often if you have foot problems. Report any cuts, sores, or bruises to your health care provider immediately. SEEK MEDICAL CARE IF:   You have an injury that is not healing.  You have cuts or breaks in the skin.  You have an ingrown nail.  You notice redness on your legs or feet.  You feel burning or tingling in your legs or feet.  You have pain or cramps in your  legs and feet.  Your legs or feet are numb.  Your feet always feel cold. SEEK IMMEDIATE MEDICAL CARE IF:   There is increasing redness, swelling, or pain in or around a wound.  There is a red line that goes up your leg.  Pus is coming from a wound.  You develop a fever or as directed by your health care provider.  You notice a bad smell coming from an ulcer or wound.   This information is not intended to replace advice given to you by your health care provider. Make sure you discuss any questions you have with your health care provider.   Document Released: 02/07/2000 Document Revised: 10/12/2012 Document Reviewed: 07/19/2012 Elsevier Interactive Patient Education [employer].

## 2015-04-20 ENCOUNTER — Encounter (HOSPITAL_COMMUNITY): Payer: Self-pay | Admitting: Emergency Medicine

## 2015-04-20 ENCOUNTER — Observation Stay (HOSPITAL_COMMUNITY)
Admission: EM | Admit: 2015-04-20 | Discharge: 2015-04-20 | Disposition: A | Discharge status: Home / Self Care | Payer: Medicaid Other | Attending: Emergency Medicine | Admitting: Emergency Medicine

## 2015-04-20 DIAGNOSIS — E11649 Type 2 diabetes mellitus with hypoglycemia without coma: Secondary | ICD-10-CM | POA: Diagnosis not present

## 2015-04-20 DIAGNOSIS — I10 Essential (primary) hypertension: Secondary | ICD-10-CM | POA: Diagnosis not present

## 2015-04-20 DIAGNOSIS — F17210 Nicotine dependence, cigarettes, uncomplicated: Secondary | ICD-10-CM | POA: Diagnosis not present

## 2015-04-20 DIAGNOSIS — E162 Hypoglycemia, unspecified: Secondary | ICD-10-CM

## 2015-04-20 DIAGNOSIS — E785 Hyperlipidemia, unspecified: Secondary | ICD-10-CM | POA: Insufficient documentation

## 2015-04-20 DIAGNOSIS — Z79899 Other long term (current) drug therapy: Secondary | ICD-10-CM | POA: Insufficient documentation

## 2015-04-20 DIAGNOSIS — E1142 Type 2 diabetes mellitus with diabetic polyneuropathy: Secondary | ICD-10-CM | POA: Insufficient documentation

## 2015-04-20 DIAGNOSIS — Z794 Long term (current) use of insulin: Secondary | ICD-10-CM | POA: Insufficient documentation

## 2015-04-20 DIAGNOSIS — E1151 Type 2 diabetes mellitus with diabetic peripheral angiopathy without gangrene: Secondary | ICD-10-CM | POA: Insufficient documentation

## 2015-04-20 DIAGNOSIS — I209 Angina pectoris, unspecified: Secondary | ICD-10-CM | POA: Diagnosis present

## 2015-04-20 DIAGNOSIS — E11319 Type 2 diabetes mellitus with unspecified diabetic retinopathy without macular edema: Secondary | ICD-10-CM | POA: Insufficient documentation

## 2015-04-20 LAB — BASIC METABOLIC PANEL
ANION GAP: 11 (ref 5–15)
BUN: 40 mg/dL — ABNORMAL HIGH (ref 6–20)
CO2: 23 mmol/L (ref 22–32)
Calcium: 9.3 mg/dL (ref 8.9–10.3)
Chloride: 104 mmol/L (ref 101–111)
Creatinine, Ser: 1.5 mg/dL — ABNORMAL HIGH (ref 0.44–1.00)
GFR, EST AFRICAN AMERICAN: 42 mL/min — AB (ref >60)
GFR, EST NON AFRICAN AMERICAN: 36 mL/min — AB (ref >60)
GLUCOSE: 145 mg/dL — AB (ref 65–99)
POTASSIUM: 5.1 mmol/L (ref 3.5–5.1)
Sodium: 138 mmol/L (ref 135–145)

## 2015-04-20 LAB — URINE MICROSCOPIC-ADD ON

## 2015-04-20 LAB — URINALYSIS, ROUTINE W REFLEX MICROSCOPIC
Bilirubin Urine: NEGATIVE
Glucose, UA: NEGATIVE mg/dL
Hgb urine dipstick: NEGATIVE
Ketones, ur: NEGATIVE mg/dL
LEUKOCYTES UA: NEGATIVE
NITRITE: NEGATIVE
PH: 5.5 (ref 5.0–8.0)
Protein, ur: 30 mg/dL — AB
SPECIFIC GRAVITY, URINE: 1.015 (ref 1.005–1.030)

## 2015-04-20 LAB — CBC
HEMATOCRIT: 32.8 % — AB (ref 36.0–46.0)
HEMOGLOBIN: 10.9 g/dL — AB (ref 12.0–15.0)
MCH: 29.5 pg (ref 26.0–34.0)
MCHC: 33.2 g/dL (ref 30.0–36.0)
MCV: 88.9 fL (ref 78.0–100.0)
Platelets: 147 10*3/uL — ABNORMAL LOW (ref 150–400)
RBC: 3.69 MIL/uL — AB (ref 3.87–5.11)
RDW: 14.1 % (ref 11.5–15.5)
WBC: 3.3 10*3/uL — AB (ref 4.0–10.5)

## 2015-04-20 LAB — CBG MONITORING, ED: GLUCOSE-CAPILLARY: 115 mg/dL — AB (ref 65–99)

## 2015-04-20 MED ORDER — ASPIRIN 81 MG PO CHEW: 324.0000 mg | CHEWABLE_TABLET | Freq: Once | ORAL | Status: DC

## 2015-04-20 NOTE — Discharge Instructions (Signed)
Blood sugar - keep checking them and write them down on a log.  See your doctor in 1 week.  Hypoglycemia Hypoglycemia occurs when the glucose in your blood is too low. Glucose is a type of sugar that is your body's main energy source. Hormones, such as insulin and glucagon, control the level of glucose in the blood. Insulin lowers blood glucose and glucagon increases blood glucose. Having too much insulin in your blood stream, or not eating enough food containing sugar, can result in hypoglycemia. Hypoglycemia can happen to people with or without diabetes. It can develop quickly and can be a medical emergency.  CAUSES   Missing or delaying meals.  Not eating enough carbohydrates at meals.  Taking too much diabetes medicine.  Not timing your oral diabetes medicine or insulin doses with meals, snacks, and exercise.  Nausea and vomiting.  Certain medicines.  Severe illnesses, such as hepatitis, kidney disorders, and certain eating disorders.  Increased activity or exercise without eating something extra or adjusting medicines.  Drinking too much alcohol.  A nerve disorder that affects body functions like your heart rate, blood pressure, and digestion (autonomic neuropathy).  A condition where the stomach muscles do not function properly (gastroparesis). Therefore, medicines and food may not absorb properly.  Rarely, a tumor of the pancreas can produce too much insulin. SYMPTOMS   Hunger.  Sweating (diaphoresis).  Change in body temperature.  Shakiness.  Headache.  Anxiety.  Lightheadedness.  Irritability.  Difficulty concentrating.  Dry mouth.  Tingling or numbness in the hands or feet.  Restless sleep or sleep disturbances.  Altered speech and coordination.  Change in mental status.  Seizures or prolonged convulsions.  Combativeness.  Drowsiness (lethargic).  Weakness.  Increased heart rate or palpitations.  Confusion.  Pale, gray skin  color.  Blurred or double vision.  Fainting. DIAGNOSIS  A physical exam and medical history will be performed. Your caregiver may make a diagnosis based on your symptoms. Blood tests and other lab tests may be performed to confirm a diagnosis. Once the diagnosis is made, your caregiver will see if your signs and symptoms go away once your blood glucose is raised.  TREATMENT  Usually, you can easily treat your hypoglycemia when you notice symptoms.  Check your blood glucose. If it is less than 70 mg/dl, take one of the following:   3-4 glucose tablets.    cup juice.    cup regular soda.   1 cup skim milk.   -1 tube of glucose gel.   5-6 hard candies.   Avoid high-fat drinks or food that may delay a rise in blood glucose levels.  Do not take more than the recommended amount of sugary foods, drinks, gel, or tablets. Doing so will cause your blood glucose to go too high.   Wait 10-15 minutes and recheck your blood glucose. If it is still less than 70 mg/dl or below your target range, repeat treatment.   Eat a snack if it is more than 1 hour until your next meal.  There may be a time when your blood glucose may go so low that you are unable to treat yourself at home when you start to notice symptoms. You may need someone to help you. You may even faint or be unable to swallow. If you cannot treat yourself, someone will need to bring you to the hospital.  [place]  If you have diabetes, follow your diabetes management plan by:  Taking your medicines as directed.  Following your exercise plan.  Following your meal plan. Do not skip meals. Eat on time.  Testing your blood glucose regularly. Check your blood glucose before and after exercise. If you exercise longer or different than usual, be sure to check blood glucose more frequently.  Wearing your medical alert jewelry that says you have diabetes.  Identify the cause of your hypoglycemia. Then,  develop ways to prevent the recurrence of hypoglycemia.  Do not take a hot bath or shower right after an insulin shot.  Always carry treatment with you. Glucose tablets are the easiest to carry.  If you are going to drink alcohol, drink it only with meals.  Tell friends or family members ways to keep you safe during a seizure. This may include removing hard or sharp objects from the area or turning you on your side.  Maintain a healthy weight. SEEK MEDICAL CARE IF:   You are having problems keeping your blood glucose in your target range.  You are having frequent episodes of hypoglycemia.  You feel you might be having side effects from your medicines.  You are not sure why your blood glucose is dropping so low.  You notice a change in vision or a new problem with your vision. SEEK IMMEDIATE MEDICAL CARE IF:   Confusion develops.  A change in mental status occurs.  The inability to swallow develops.  Fainting occurs.   This information is not intended to replace advice given to you by your health care provider. Make sure you discuss any questions you have with your health care provider.   Document Released: 02/09/2005 Document Revised: 02/14/2013 Document Reviewed: 10/16/2014 Elsevier Interactive Patient Education [employer].

## 2015-04-20 NOTE — ED Notes (Signed)
Pt reports that her blood sugar has been running low since yesterday. CBG 115 in triage.

## 2015-04-22 NOTE — ED Provider Notes (Signed)
CSN: [id number]     Arrival date & time 04/20/15  1857 History   First MD Initiated Contact with Patient 04/20/15 1914     Chief Complaint  Patient presents with  . Hypoglycemia     (Consider location/radiation/quality/duration/timing/severity/associated sxs/prior Treatment) HPI Comments: Pt comes in with cc of low blood sugar. Pt reports that she has been having low blood glucose read the last few days despite normal po intake. She had 1 reading as low at 65. Her sugars are lowest in the morning. Typically her sugar is in the 140s in the AM and 200s at night. No new meds. She takes lantus. She has stopped [place] and metformin for some while now. Pt denies nausea, emesis, fevers, chills, chest pains, shortness of breath, headaches, abdominal pain, uti like symptoms.   Patient is a 65 y.o. female presenting with hypoglycemia. The history is provided by the patient.  Hypoglycemia Associated symptoms: no shortness of breath and no vomiting     Past Medical History  Diagnosis Date  . Diabetes mellitus   . Hypertension   . ANEMIA, CHRONIC DISEASE NEC 03/15/2006  . DEGENERATIVE JOINT DISEASE 05/06/2007    On going for >5 yrs No imaging to confirm Has tried ultram, mobic, neurontin which did not work Ambulance person tried once worked well    . 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 DIABETIC  RETINOPATHY 03/15/2006  . Hyperlipidemia 04/01/2011  . PANCREATITIS, CHRONIC 03/29/2006  . RENAL INSUFFICIENCY, CHRONIC 03/15/2006  . Peripheral arterial disease ([place])   . Hemorrhoids   . Diastolic dysfunction 99991111    Grade 1 by Echocardiogram 12/13/14  . Moderate aortic regurgitation 09/25/2008    12/13/14 Echocardiogram     Past Surgical History  Procedure Laterality Date  . Breast surgery    . Mouth surgery N/A    No family history on file. Social History  Substance Use Topics  . Smoking status: Current Some Day Smoker -- 0.50 packs/day    Types: Cigarettes  . Smokeless tobacco: Never Used      Comment: 3 -6 cigs  . Alcohol Use: No   OB History    No data available     Review of Systems  Constitutional: Negative for activity change.  Respiratory: Negative for shortness of breath.   Cardiovascular: Negative for chest pain.  Gastrointestinal: Negative for nausea, vomiting and abdominal pain.  Genitourinary: Negative for dysuria.  Musculoskeletal: Negative for neck pain.  Neurological: Negative for headaches.      Allergies  Gabapentin; Januvia; and Nicorette  Home Medications   Prior to Admission medications   Medication Sig Start Date End Date Taking? Authorizing Provider  albuterol (PROVENTIL HFA) 108 (90 BASE) MCG/ACT inhaler Inhale 2 puffs into the lungs every 6 (six) hours as needed for wheezing. 12/25/14 12/25/15 Yes Lucious Groves, DO  amitriptyline (ELAVIL) 25 MG tablet Take 1 tablet (25 mg total) by mouth at bedtime. 09/25/14  Yes Lucious Groves, DO  atorvastatin (LIPITOR) 40 MG tablet Take 1 tablet (40 mg total) by mouth daily. Patient taking differently: Take 40 mg by mouth daily at 6 PM.  03/14/15 03/13/16 Yes Lucious Groves, DO  HYDROcodone-acetaminophen (NORCO) 5-325 MG tablet Take 1 tablet by mouth every 6 (six) hours as needed for moderate pain. 03/14/15  Yes Lucious Groves, DO  ipratropium (ATROVENT HFA) 17 MCG/ACT inhaler inhale 1 to 2 puffs every 6 hours if needed Patient taking differently: Inhale 1-2 puffs into the lungs every 6 (six) hours as needed  for wheezing. inhale 1 to 2 puffs every 6 hours if needed 12/27/14  Yes Lucious Groves, DO  losartan (COZAAR) 50 MG tablet Take 1 tablet (50 mg total) by mouth daily. 12/05/14 12/05/15 Yes Shela Leff, MD  Pancrelipase, Lip-Prot-Amyl, 24000 UNITS CPEP Take 3 tablets With meals and 2 tablets with snacks. No more than 10/24hours Patient taking differently: Take 48,000-72,000 Units by mouth See admin instructions. Take 3 tablets With meals and 2 tablets with snacks. No more than 10/24hours 07/31/14  Yes Lucious Groves, DO  polyethylene glycol (MIRALAX / GLYCOLAX) packet Take 17 g by mouth daily as needed for mild constipation. 09/05/14  Yes Lucious Groves, DO  promethazine (PHENERGAN) 12.5 MG tablet Take 1 tablet (12.5 mg total) by mouth every 6 (six) hours as needed for nausea or vomiting. 06/01/14  Yes Lucious Groves, DO  silver sulfADIAZINE (SILVADENE) 1 % cream Apply 1 application topically daily. Patient taking differently: Apply 1 application topically daily as needed (for foot wound).  07/02/14  Yes Richard C Tuchman, DPM  travoprost, benzalkonium, (TRAVATAN) 0.004 % ophthalmic solution Place 1 drop into both eyes at bedtime.   Yes Historical Provider, MD  insulin glargine (LANTUS) 100 UNIT/ML injection Inject 10 Units into the skin every morning.     Historical Provider, MD  nicotine polacrilex (NICORETTE) 2 MG gum Take 1 each (2 mg total) by mouth as needed for smoking cessation. Patient not taking: Reported on 04/20/2015 11/29/14   Lucious Groves, DO  ondansetron (ZOFRAN ODT) 4 MG disintegrating tablet Take 1 tablet (4 mg total) by mouth every 8 (eight) hours as needed for nausea or vomiting. Patient not taking: Reported on 04/20/2015 11/06/14   Kristen N Ward, DO  sitaGLIPtin (JANUVIA) 50 MG tablet Take 1 tablet (50 mg total) by mouth daily. Patient not taking: Reported on 04/20/2015 03/30/14   Lucious Groves, DO   BP 164/76 mmHg  Pulse 72  Temp(Src) 98.8 F (37.1 C) (Oral)  Resp 18  Ht 5\' 5"  (1.651 m)  Wt 142 lb (64.411 kg)  BMI 23.63 kg/m2  SpO2 100% Physical Exam  Constitutional: She is oriented to person, place, and time. She appears well-developed.  HENT:  Head: Normocephalic and atraumatic.  Eyes: EOM are normal.  Neck: Normal range of motion. Neck supple.  Cardiovascular: Normal rate.   Pulmonary/Chest: Effort normal.  Abdominal: Bowel sounds are normal.  Neurological: She is alert and oriented to person, place, and time.  Skin: Skin is warm and dry.  Nursing note and vitals  reviewed.   ED Course  Procedures (including critical care time) Labs Review Labs Reviewed  BASIC METABOLIC PANEL - Abnormal; Notable for the following:    Glucose, Bld 145 (*)    BUN 40 (*)    Creatinine, Ser 1.50 (*)    GFR calc non Af Amer 36 (*)    GFR calc Af Amer 42 (*)    All other components within normal limits  CBC - Abnormal; Notable for the following:    WBC 3.3 (*)    RBC 3.69 (*)    Hemoglobin 10.9 (*)    HCT 32.8 (*)    Platelets 147 (*)    All other components within normal limits  URINALYSIS, ROUTINE W REFLEX MICROSCOPIC (NOT AT [facility]) - Abnormal; Notable for the following:    Protein, ur 30 (*)    All other components within normal limits  URINE MICROSCOPIC-ADD ON - Abnormal; Notable for the following:  Squamous Epithelial / LPF 0-5 (*)    Bacteria, UA RARE (*)    All other components within normal limits  CBG MONITORING, ED - Abnormal; Notable for the following:    Glucose-Capillary 115 (*)    All other components within normal limits  CBG MONITORING, ED    Imaging Review No results found. I have personally reviewed and evaluated these images and lab results as part of my medical decision-making.   EKG Interpretation None      MDM   Final diagnoses:  Hypoglycemia    CBG is normal here. She really hasnt had symptomatic hypoglycemia at any point the last 2 days. CBG at triage normal, and labs pending. No dehydration. No SIRS and no concerns for infection. No weight loss, no new diet. Will have her see her pcp for optimal care.    Varney Biles, MD 04/22/15 [phone]

## 2015-05-09 ENCOUNTER — Encounter: Payer: Self-pay | Admitting: Internal Medicine

## 2015-05-09 ENCOUNTER — Ambulatory Visit (INDEPENDENT_AMBULATORY_CARE_PROVIDER_SITE_OTHER): Payer: Medicaid Other | Admitting: Internal Medicine

## 2015-05-09 VITALS — BP 151/56 | HR 82 | Temp 98.0°F | Ht 65.0 in | Wt 139.2 lb

## 2015-05-09 DIAGNOSIS — F17200 Nicotine dependence, unspecified, uncomplicated: Secondary | ICD-10-CM

## 2015-05-09 DIAGNOSIS — F17210 Nicotine dependence, cigarettes, uncomplicated: Secondary | ICD-10-CM

## 2015-05-09 DIAGNOSIS — Z794 Long term (current) use of insulin: Secondary | ICD-10-CM

## 2015-05-09 DIAGNOSIS — E1122 Type 2 diabetes mellitus with diabetic chronic kidney disease: Secondary | ICD-10-CM | POA: Diagnosis not present

## 2015-05-09 DIAGNOSIS — E1140 Type 2 diabetes mellitus with diabetic neuropathy, unspecified: Secondary | ICD-10-CM

## 2015-05-09 DIAGNOSIS — I129 Hypertensive chronic kidney disease with stage 1 through stage 4 chronic kidney disease, or unspecified chronic kidney disease: Secondary | ICD-10-CM

## 2015-05-09 DIAGNOSIS — K8689 Other specified diseases of pancreas: Secondary | ICD-10-CM

## 2015-05-09 DIAGNOSIS — N183 Chronic kidney disease, stage 3 unspecified: Secondary | ICD-10-CM

## 2015-05-09 DIAGNOSIS — I10 Essential (primary) hypertension: Secondary | ICD-10-CM

## 2015-05-09 MED ORDER — PREGABALIN 75 MG PO CAPS: 75.0000 mg | ORAL_CAPSULE | Freq: Two times a day (BID) | ORAL | Status: DC

## 2015-05-09 MED ORDER — HYDROCODONE-ACETAMINOPHEN 5-325 MG PO TABS: 1.0000 | ORAL_TABLET | Freq: Four times a day (QID) | ORAL | Status: DC | PRN

## 2015-05-09 MED ORDER — GLUCOSE BLOOD VI STRP: ORAL_STRIP | Status: DC

## 2015-05-09 MED ORDER — PANCRELIPASE (LIP-PROT-AMYL) 24000-76000 UNITS PO CPEP: ORAL_CAPSULE | ORAL | Status: DC

## 2015-05-09 MED ORDER — LOSARTAN POTASSIUM 50 MG PO TABS: 50.0000 mg | ORAL_TABLET | Freq: Two times a day (BID) | ORAL | Status: DC

## 2015-05-09 MED ORDER — PROMETHAZINE HCL 12.5 MG PO TABS: 12.5000 mg | ORAL_TABLET | Freq: Four times a day (QID) | ORAL | Status: DC | PRN

## 2015-05-09 NOTE — Assessment & Plan Note (Signed)
HPI: Amitryptline helps her sleep but does not improve the pain and tingling in her feet.  A: Diabetic neuropathy  P: D/C Amitryptline  Start Lyrica 75mg  BID

## 2015-05-09 NOTE — Patient Instructions (Signed)
General Instructions:   Please bring your medicines with you each time you come to clinic.  Medicines may include prescription medications, over-the-counter medications, herbal remedies, eye drops, vitamins, or other pills.   Progress Toward Treatment Goals:  Treatment Goal 11/29/2014  Hemoglobin A1C unchanged  Blood pressure deteriorated  Stop smoking smoking the same amount    Self Care Goals & Plans:  Self Care Goal 05/09/2015  Manage my medications take my medicines as prescribed; bring my medications to every visit; refill my medications on time  Monitor my health keep track of my blood glucose; bring my glucose meter and log to each visit  Eat healthy foods eat more vegetables; eat foods that are low in salt; eat baked foods instead of fried foods  Be physically active find an activity I enjoy; take a walk every day    Home Blood Glucose Monitoring 11/29/2014  Check my blood sugar 3 times a day  When to check my blood sugar before meals     Care Management & Community Referrals:  Referral 03/29/2014  Referrals made for care management support none needed    Pregabalin capsules What is this medicine? PREGABALIN (pre GAB a lin) is used to treat nerve pain from diabetes, shingles, spinal cord injury, and fibromyalgia. It is also used to control seizures in epilepsy. This medicine may be used for other purposes; ask your health care provider or pharmacist if you have questions. What should I tell my health care provider before I take this medicine? They need to know if you have any of these conditions: -bleeding problems -heart disease, including heart failure -history of alcohol or drug abuse -kidney disease -suicidal thoughts, plans, or attempt; a previous suicide attempt by you or a family member -an unusual or allergic reaction to pregabalin, gabapentin, other medicines, foods, dyes, or preservatives -pregnant or trying to get pregnant or trying to conceive with your  partner -breast-feeding How should I use this medicine? Take this medicine by mouth with a glass of water. Follow the directions on the prescription label. You can take this medicine with or without food. Take your doses at regular intervals. Do not take your medicine more often than directed. Do not stop taking except on your doctor's advice. A special MedGuide will be given to you by the pharmacist with each prescription and refill. Be sure to read this information carefully each time. Talk to your pediatrician regarding the use of this medicine in children. Special care may be needed. Overdosage: If you think you have taken too much of this medicine contact a poison control center or emergency room at once. NOTE: This medicine is only for you. Do not share this medicine with others. What if I miss a dose? If you miss a dose, take it as soon as you can. If it is almost time for your next dose, take only that dose. Do not take double or extra doses. What may interact with this medicine? -alcohol -certain medicines for blood pressure like captopril, enalapril, or lisinopril -certain medicines for diabetes, like pioglitazone or rosiglitazone -certain medicines for anxiety or sleep -narcotic medicines for pain This list may not describe all possible interactions. Give your health care provider a list of all the medicines, herbs, non-prescription drugs, or dietary supplements you use. Also tell them if you smoke, drink alcohol, or use illegal drugs. Some items may interact with your medicine. What should I watch for while using this medicine? Tell your doctor or healthcare professional if your  symptoms do not start to get better or if they get worse. Visit your doctor or health care professional for regular checks on your progress. Do not stop taking except on your doctor's advice. You may develop a severe reaction. Your doctor will tell you how much medicine to take. Wear a medical identification  bracelet or chain if you are taking this medicine for seizures, and carry a card that describes your disease and details of your medicine and dosage times. You may get drowsy or dizzy. Do not drive, use machinery, or do anything that needs mental alertness until you know how this medicine affects you. Do not stand or sit up quickly, especially if you are an older patient. This reduces the risk of dizzy or fainting spells. Alcohol may interfere with the effect of this medicine. Avoid alcoholic drinks. If you have a heart condition, like congestive heart failure, and notice that you are retaining water and have swelling in your hands or feet, contact your health care provider immediately. The use of this medicine may increase the chance of suicidal thoughts or actions. Pay special attention to how you are responding while on this medicine. Any worsening of mood, or thoughts of suicide or dying should be reported to your health care professional right away. This medicine has caused reduced sperm counts in some men. This may interfere with the ability to father a child. You should talk to your doctor or health care professional if you are concerned about your fertility. Women who become pregnant while using this medicine for seizures may enroll in the [place] Pregnancy Registry by calling ([phone]. This registry collects information about the safety of antiepileptic drug use during pregnancy. What side effects may I notice from receiving this medicine? Side effects that you should report to your doctor or health care professional as soon as possible: -allergic reactions like skin rash, itching or hives, swelling of the face, lips, or tongue -breathing problems -changes in vision -chest pain -confusion -jerking or unusual movements of any part of your body -loss of memory -muscle pain, tenderness, or weakness -suicidal thoughts or other mood changes -swelling of the  ankles, feet, hands -unusual bruising or bleeding Side effects that usually do not require medical attention (Report these to your doctor or health care professional if they continue or are bothersome.): -dizziness -drowsiness -dry mouth -headache -nausea -tremors -trouble sleeping -weight gain This list may not describe all possible side effects. Call your doctor for medical advice about side effects. You may report side effects to FDA at 1-800-FDA-1088. Where should I keep my medicine? Keep out of the reach of children. This medicine can be abused. Keep your medicine in a safe place to protect it from theft. Do not share this medicine with anyone. Selling or giving away this medicine is dangerous and against the law. This medicine may cause accidental overdose and death if it taken by other adults, children, or pets. Mix any unused medicine with a substance like cat litter or coffee grounds. Then throw the medicine away in a sealed container like a sealed bag or a coffee can with a lid. Do not use the medicine after the expiration date. Store at room temperature between 15 and 30 degrees C (59 and 86 degrees F). NOTE: This sheet is a summary. It may not cover all possible information. If you have questions about this medicine, talk to your doctor, pharmacist, or health care provider.    2016, Elsevier/[employer] Standard. (2013-04-07 15:38:53)

## 2015-05-09 NOTE — Assessment & Plan Note (Signed)
HPI: She by accident discarded her Creon last week. She is now having greasy fatty stools that is unpleasant for her.  A: Pancreatic insufficiency  P: -Reordered Creon -Sent message to our clinical pharmacist for advice with discarded medication.

## 2015-05-09 NOTE — Progress Notes (Signed)
[facility] Subjective:   Patient ID: [name] female   DOB: 09-09-1950 65 y.o.   MRN: [id number]  HPI: [name] is a 65 y.o. female with a PMH detailed below who presents for greasy stools  Please see problem based charting below for the status of her chronic medical problems.    Past Medical History  Diagnosis Date  . Diabetes mellitus   . Hypertension   . ANEMIA, CHRONIC DISEASE NEC 03/15/2006  . DEGENERATIVE JOINT DISEASE 05/06/2007    On going for >5 yrs No imaging to confirm Has tried ultram, mobic, neurontin which did not work Ambulance person tried once worked well    . 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 DIABETIC  RETINOPATHY 03/15/2006  . Hyperlipidemia 04/01/2011  . PANCREATITIS, CHRONIC 03/29/2006  . RENAL INSUFFICIENCY, CHRONIC 03/15/2006  . Peripheral arterial disease ([name])   . Hemorrhoids   . Diastolic dysfunction 99991111    Grade 1 by Echocardiogram 12/13/14  . Moderate aortic regurgitation 09/25/2008    12/13/14 Echocardiogram     Current Outpatient Prescriptions  Medication Sig Dispense Refill  . albuterol (PROVENTIL HFA) 108 (90 BASE) MCG/ACT inhaler Inhale 2 puffs into the lungs every 6 (six) hours as needed for wheezing. 6.7 g 6  . amitriptyline (ELAVIL) 25 MG tablet Take 1 tablet (25 mg total) by mouth at bedtime. 90 tablet 0  . atorvastatin (LIPITOR) 40 MG tablet Take 1 tablet (40 mg total) by mouth daily. (Patient taking differently: Take 40 mg by mouth daily at 6 PM. ) 90 tablet 3  . HYDROcodone-acetaminophen (NORCO) 5-325 MG tablet Take 1 tablet by mouth every 6 (six) hours as needed for moderate pain. 60 tablet 0  . insulin glargine (LANTUS) 100 UNIT/ML injection Inject 10 Units into the skin every morning.     Marland Kitchen ipratropium (ATROVENT HFA) 17 MCG/ACT inhaler inhale 1 to 2 puffs every 6 hours if needed (Patient taking differently: Inhale 1-2 puffs into the lungs every 6 (six) hours as needed for wheezing. inhale 1 to 2  puffs every 6 hours if needed) 12.9 g 0  . losartan (COZAAR) 50 MG tablet Take 1 tablet (50 mg total) by mouth daily. 90 tablet 1  . nicotine polacrilex (NICORETTE) 2 MG gum Take 1 each (2 mg total) by mouth as needed for smoking cessation. (Patient not taking: Reported on 04/20/2015) 100 tablet 1  . ondansetron (ZOFRAN ODT) 4 MG disintegrating tablet Take 1 tablet (4 mg total) by mouth every 8 (eight) hours as needed for nausea or vomiting. (Patient not taking: Reported on 04/20/2015) 20 tablet 0  . Pancrelipase, Lip-Prot-Amyl, 24000 UNITS CPEP Take 3 tablets With meals and 2 tablets with snacks. No more than 10/24hours (Patient taking differently: Take 48,000-72,000 Units by mouth See admin instructions. Take 3 tablets With meals and 2 tablets with snacks. No more than 10/24hours) 180 capsule 11  . polyethylene glycol (MIRALAX / GLYCOLAX) packet Take 17 g by mouth daily as needed for mild constipation. 14 each 5  . promethazine (PHENERGAN) 12.5 MG tablet Take 1 tablet (12.5 mg total) by mouth every 6 (six) hours as needed for nausea or vomiting. 60 tablet 2  . silver sulfADIAZINE (SILVADENE) 1 % cream Apply 1 application topically daily. (Patient taking differently: Apply 1 application topically daily as needed (for foot wound). ) 50 g 0  . sitaGLIPtin (JANUVIA) 50 MG tablet Take 1 tablet (50 mg total) by mouth daily. (Patient not taking: Reported on 04/20/2015) 30  tablet 2  . travoprost, benzalkonium, (TRAVATAN) 0.004 % ophthalmic solution Place 1 drop into both eyes at bedtime.     No current facility-administered medications for this visit.   No family history on file. Social History   Social History  . Marital Status: Single    Spouse Name: N/A  . Number of Children: N/A  . Years of Education: N/A   Social History Main Topics  . Smoking status: Current Some Day Smoker -- 0.50 packs/day    Types: Cigarettes  . Smokeless tobacco: Never Used     Comment: 3 -6 cigs  . Alcohol Use: No  .  Drug Use: No  . Sexual Activity: Not on file   Other Topics Concern  . Not on file   Social History Narrative   Review of Systems: Review of Systems  Constitutional: Positive for malaise/fatigue. Negative for fever, chills and weight loss.  Respiratory: Negative for cough.   Cardiovascular: Negative for chest pain.  Gastrointestinal: Positive for diarrhea. Negative for vomiting, abdominal pain and blood in stool.  Genitourinary: Negative for dysuria.  Musculoskeletal: Negative for myalgias.     Objective:  Physical Exam: Filed Vitals:   05/09/15 1328  BP: 151/56  Pulse: 82  Temp: 98 F (36.7 C)  TempSrc: Oral  Height: 5\' 5"  (1.651 m)  Weight: 139 lb 3.2 oz (63.141 kg)  SpO2: 100%  Physical Exam  Constitutional: She is oriented to person, place, and time and well-developed, well-nourished, and in no distress.  Eyes: Conjunctivae are normal.  Cardiovascular: Normal rate and regular rhythm.   Murmur heard. Pulmonary/Chest: Effort normal and breath sounds normal.  Abdominal: Soft. Bowel sounds are normal. There is no tenderness.  Musculoskeletal: She exhibits no edema.  Neurological: She is alert and oriented to person, place, and time.  Skin: Skin is warm and dry.  Nursing note and vitals reviewed.   Assessment & Plan:  Case discussed with Dr. Dareen Piano  Diabetic neuropathy, painful ([place]) HPI: Amitryptline helps her sleep but does not improve the pain and tingling in her feet.  A: Diabetic neuropathy  P: D/C Amitryptline  Start Lyrica 75mg  BID  Essential hypertension HPI: She has been taking losartan 50mg  daily without issues  A: Essential Hypertension, not at goal  P: Increase Losartan to 50mg  BID.  CKD stage 3 due to type 2 diabetes mellitus ([place]) - Repeat BMP today  TOBACCO ABUSE HPI: Still smoking, working on quitting. Smoking 3-6 cigarettes a day  A: TObacco Abuse  P: Stressed importance of cessation and health benefits.  Spent 3 minutes discussing  smoking cessation.  Pancreatic insufficiency ([facility]) HPI: She by accident discarded her Creon last week. She is now having greasy fatty stools that is unpleasant for her.  A: Pancreatic insufficiency  P: -Reordered Creon -Sent message to our clinical pharmacist for advice with discarded medication.    Medications Ordered Meds ordered this encounter  Medications  . Pancrelipase, Lip-Prot-Amyl, 24000 units CPEP    Sig: Take 3 tablets With meals and 2 tablets with snacks. No more than 10/24hours    Dispense:  180 capsule    Refill:  11  . promethazine (PHENERGAN) 12.5 MG tablet    Sig: Take 1 tablet (12.5 mg total) by mouth every 6 (six) hours as needed for nausea or vomiting.    Dispense:  60 tablet    Refill:  2  . DISCONTD: HYDROcodone-acetaminophen (NORCO) 5-325 MG tablet    Sig: Take 1 tablet by mouth every 6 (six) hours  as needed for moderate pain.    Dispense:  60 tablet    Refill:  0    Rx 1/3 to be filled 30 days after previous script  . DISCONTD: HYDROcodone-acetaminophen (NORCO) 5-325 MG tablet    Sig: Take 1 tablet by mouth every 6 (six) hours as needed for moderate pain.    Dispense:  60 tablet    Refill:  0    Rx 2/3 to be filled 30 days after previous script  . HYDROcodone-acetaminophen (NORCO) 5-325 MG tablet    Sig: Take 1 tablet by mouth every 6 (six) hours as needed for moderate pain.    Dispense:  60 tablet    Refill:  0    Rx 3/3 to be filled 30 days after previous script  . pregabalin (LYRICA) 75 MG capsule    Sig: Take 1 capsule (75 mg total) by mouth 2 (two) times daily.    Dispense:  60 capsule    Refill:  5  . losartan (COZAAR) 50 MG tablet    Sig: Take 1 tablet (50 mg total) by mouth 2 (two) times daily.    Dispense:  180 tablet    Refill:  1  . glucose blood (ACCU-CHEK AVIVA) test strip    Sig: Use as instructed    Dispense:  100 each    Refill:  12    The patient is insulin requiring, ICD 10 code E11.65. The patient tests 3 times per day.    Other Orders Orders Placed This Encounter  Procedures  . BMP8+Anion Gap   Follow Up: Return in about 3 months (around 08/09/2015).

## 2015-05-09 NOTE — Assessment & Plan Note (Signed)
Repeat BMP today. 

## 2015-05-09 NOTE — Assessment & Plan Note (Signed)
HPI: She has been taking losartan 50mg  daily without issues  A: Essential Hypertension, not at goal  P: Increase Losartan to 50mg  BID.

## 2015-05-09 NOTE — Assessment & Plan Note (Signed)
HPI: Still smoking, working on quitting. Smoking 3-6 cigarettes a day  A: TObacco Abuse  P: Stressed importance of cessation and health benefits.  Spent 3 minutes discussing smoking cessation.

## 2015-05-10 LAB — BMP8+ANION GAP
ANION GAP: 20 mmol/L — AB (ref 10.0–18.0)
BUN/Creatinine Ratio: 30 — ABNORMAL HIGH (ref 11–26)
BUN: 35 mg/dL — AB (ref 8–27)
CHLORIDE: 101 mmol/L (ref 96–106)
CO2: 19 mmol/L (ref 18–29)
CREATININE: 1.15 mg/dL — AB (ref 0.57–1.00)
Calcium: 9 mg/dL (ref 8.7–10.3)
GFR calc Af Amer: 59 mL/min/{1.73_m2} — ABNORMAL LOW (ref >59)
GFR calc non Af Amer: 51 mL/min/{1.73_m2} — ABNORMAL LOW (ref >59)
Glucose: 174 mg/dL — ABNORMAL HIGH (ref 65–99)
Potassium: 4.5 mmol/L (ref 3.5–5.2)
SODIUM: 140 mmol/L (ref 134–144)

## 2015-05-10 NOTE — Progress Notes (Signed)
Internal Medicine Clinic Attending  Case discussed with Dr. Hoffman soon after the resident saw the patient.  We reviewed the resident's history and exam and pertinent patient test results.  I agree with the assessment, diagnosis, and plan of care documented in the resident's note. 

## 2015-05-15 ENCOUNTER — Encounter: Payer: Self-pay | Admitting: Podiatry

## 2015-05-15 ENCOUNTER — Ambulatory Visit (INDEPENDENT_AMBULATORY_CARE_PROVIDER_SITE_OTHER): Payer: Medicaid Other | Admitting: Podiatry

## 2015-05-15 VITALS — BP 119/60 | HR 69 | Resp 12

## 2015-05-15 DIAGNOSIS — L84 Corns and callosities: Secondary | ICD-10-CM | POA: Diagnosis not present

## 2015-05-15 DIAGNOSIS — E1142 Type 2 diabetes mellitus with diabetic polyneuropathy: Secondary | ICD-10-CM | POA: Diagnosis not present

## 2015-05-15 DIAGNOSIS — L97501 Non-pressure chronic ulcer of other part of unspecified foot limited to breakdown of skin: Secondary | ICD-10-CM | POA: Diagnosis not present

## 2015-05-15 NOTE — Patient Instructions (Signed)
Apply Silvadene cream to skin ulcer on the bottom of the right foot daily and cover with gauze  Diabetes and Foot Care Diabetes may cause you to have problems because of poor blood supply (circulation) to your feet and legs. This may cause the skin on your feet to become thinner, break easier, and heal more slowly. Your skin may become dry, and the skin may peel and crack. You may also have nerve damage in your legs and feet causing decreased feeling in them. You may not notice minor injuries to your feet that could lead to infections or more serious problems. Taking care of your feet is one of the most important things you can do for yourself.  HOME CARE INSTRUCTIONS  Wear shoes at all times, even in the house. Do not go barefoot. Bare feet are easily injured.  Check your feet daily for blisters, cuts, and redness. If you cannot see the bottom of your feet, use a mirror or ask someone for help.  Wash your feet with warm water (do not use hot water) and mild soap. Then pat your feet and the areas between your toes until they are completely dry. Do not soak your feet as this can dry your skin.  Apply a moisturizing lotion or petroleum jelly (that does not contain alcohol and is unscented) to the skin on your feet and to dry, brittle toenails. Do not apply lotion between your toes.  Trim your toenails straight across. Do not dig under them or around the cuticle. File the edges of your nails with an emery board or nail file.  Do not cut corns or calluses or try to remove them with medicine.  Wear clean socks or stockings every day. Make sure they are not too tight. Do not wear knee-high stockings since they may decrease blood flow to your legs.  Wear shoes that fit properly and have enough cushioning. To break in new shoes, wear them for just a few hours a day. This prevents you from injuring your feet. Always look in your shoes before you put them on to be sure there are no objects inside.  Do  not cross your legs. This may decrease the blood flow to your feet.  If you find a minor scrape, cut, or break in the skin on your feet, keep it and the skin around it clean and dry. These areas may be cleansed with mild soap and water. Do not cleanse the area with peroxide, alcohol, or iodine.  When you remove an adhesive bandage, be sure not to damage the skin around it.  If you have a wound, look at it several times a day to make sure it is healing.  Do not use heating pads or hot water bottles. They may burn your skin. If you have lost feeling in your feet or legs, you may not know it is happening until it is too late.  Make sure your health care provider performs a complete foot exam at least annually or more often if you have foot problems. Report any cuts, sores, or bruises to your health care provider immediately. SEEK MEDICAL CARE IF:   You have an injury that is not healing.  You have cuts or breaks in the skin.  You have an ingrown nail.  You notice redness on your legs or feet.  You feel burning or tingling in your legs or feet.  You have pain or cramps in your legs and feet.  Your legs or feet  are numb.  Your feet always feel cold. SEEK IMMEDIATE MEDICAL CARE IF:   There is increasing redness, swelling, or pain in or around a wound.  There is a red line that goes up your leg.  Pus is coming from a wound.  You develop a fever or as directed by your health care provider.  You notice a bad smell coming from an ulcer or wound.   This information is not intended to replace advice given to you by your health care provider. Make sure you discuss any questions you have with your health care provider.   Document Released: 02/07/2000 Document Revised: 10/12/2012 Document Reviewed: 07/19/2012 Elsevier Interactive Patient Education [employer].

## 2015-05-16 NOTE — Progress Notes (Signed)
Patient ID: [name], female   DOB: [birth year], 65 y.o.   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 There is no surrounding erythema or edema Bleeding callus first MPJ right, first MPJ left, fifth MPJ left Nucleated keratoses left heel  Assessment: Superficial ulceration fifth MPJ right Pre-ulcerative calluses 3 Diabetic peripheral neuropathy  Plan: Debrided ulcer fifth MPJ right and apply Silvadene dressing. Patient will continue apply Silvadene dressing until healed Debrided pre-ulcerative calluses 3  Reappoint 2 weeks

## 2015-05-21 ENCOUNTER — Telehealth: Payer: Self-pay | Admitting: *Deleted

## 2015-05-21 NOTE — Telephone Encounter (Signed)
Pt has tried and failed gabapentin-request approved through 05/20/2016, pharmacy aware.Despina Hidden Cassady3/28/20173:10 PM        [id number] W  PHARMACY [id number] S [facility] 05/21/2015  APPROVED 05/21/2015 - 05/20/2016  DMA

## 2015-05-29 ENCOUNTER — Ambulatory Visit: Payer: Medicaid Other | Admitting: Podiatry

## 2015-06-03 ENCOUNTER — Other Ambulatory Visit: Payer: Self-pay | Admitting: Dietician

## 2015-06-03 DIAGNOSIS — Z794 Long term (current) use of insulin: Principal | ICD-10-CM

## 2015-06-03 DIAGNOSIS — E1122 Type 2 diabetes mellitus with diabetic chronic kidney disease: Secondary | ICD-10-CM

## 2015-06-03 DIAGNOSIS — N183 Chronic kidney disease, stage 3 unspecified: Secondary | ICD-10-CM

## 2015-06-03 NOTE — Telephone Encounter (Signed)
needs a tester to check her blood sugar, hers is broken. She says her meter is at least 64 years old and she has tried to reset it. She also say s she got strips for it last month and thinks they were covered by her insurance. She does not need lancets. Request new prescription for a glucose meter. She agreed to call if any other questions of concerns

## 2015-06-04 MED ORDER — ACCU-CHEK AVIVA PLUS W/DEVICE KIT: PACK | Status: AC

## 2015-06-05 ENCOUNTER — Ambulatory Visit (INDEPENDENT_AMBULATORY_CARE_PROVIDER_SITE_OTHER): Payer: Medicaid Other | Admitting: Podiatry

## 2015-06-05 ENCOUNTER — Encounter: Payer: Self-pay | Admitting: Podiatry

## 2015-06-05 DIAGNOSIS — E1140 Type 2 diabetes mellitus with diabetic neuropathy, unspecified: Secondary | ICD-10-CM

## 2015-06-05 DIAGNOSIS — L97501 Non-pressure chronic ulcer of other part of unspecified foot limited to breakdown of skin: Secondary | ICD-10-CM | POA: Diagnosis not present

## 2015-06-05 DIAGNOSIS — L84 Corns and callosities: Secondary | ICD-10-CM

## 2015-06-05 NOTE — Patient Instructions (Signed)
Apply Silvadene cream to the skin ulcer on the bottom of the right foot daily and cover with gauze Always wear the surgical shoe on the right foot when standing and walking  Diabetes and Foot Care Diabetes may cause you to have problems because of poor blood supply (circulation) to your feet and legs. This may cause the skin on your feet to become thinner, break easier, and heal more slowly. Your skin may become dry, and the skin may peel and crack. You may also have nerve damage in your legs and feet causing decreased feeling in them. You may not notice minor injuries to your feet that could lead to infections or more serious problems. Taking care of your feet is one of the most important things you can do for yourself.  HOME CARE INSTRUCTIONS  Wear shoes at all times, even in the house. Do not go barefoot. Bare feet are easily injured.  Check your feet daily for blisters, cuts, and redness. If you cannot see the bottom of your feet, use a mirror or ask someone for help.  Wash your feet with warm water (do not use hot water) and mild soap. Then pat your feet and the areas between your toes until they are completely dry. Do not soak your feet as this can dry your skin.  Apply a moisturizing lotion or petroleum jelly (that does not contain alcohol and is unscented) to the skin on your feet and to dry, brittle toenails. Do not apply lotion between your toes.  Trim your toenails straight across. Do not dig under them or around the cuticle. File the edges of your nails with an emery board or nail file.  Do not cut corns or calluses or try to remove them with medicine.  Wear clean socks or stockings every day. Make sure they are not too tight. Do not wear knee-high stockings since they may decrease blood flow to your legs.  Wear shoes that fit properly and have enough cushioning. To break in new shoes, wear them for just a few hours a day. This prevents you from injuring your feet. Always look in  your shoes before you put them on to be sure there are no objects inside.  Do not cross your legs. This may decrease the blood flow to your feet.  If you find a minor scrape, cut, or break in the skin on your feet, keep it and the skin around it clean and dry. These areas may be cleansed with mild soap and water. Do not cleanse the area with peroxide, alcohol, or iodine.  When you remove an adhesive bandage, be sure not to damage the skin around it.  If you have a wound, look at it several times a day to make sure it is healing.  Do not use heating pads or hot water bottles. They may burn your skin. If you have lost feeling in your feet or legs, you may not know it is happening until it is too late.  Make sure your health care provider performs a complete foot exam at least annually or more often if you have foot problems. Report any cuts, sores, or bruises to your health care provider immediately. SEEK MEDICAL CARE IF:   You have an injury that is not healing.  You have cuts or breaks in the skin.  You have an ingrown nail.  You notice redness on your legs or feet.  You feel burning or tingling in your legs or feet.  You  have pain or cramps in your legs and feet.  Your legs or feet are numb.  Your feet always feel cold. SEEK IMMEDIATE MEDICAL CARE IF:   There is increasing redness, swelling, or pain in or around a wound.  There is a red line that goes up your leg.  Pus is coming from a wound.  You develop a fever or as directed by your health care provider.  You notice a bad smell coming from an ulcer or wound.   This information is not intended to replace advice given to you by your health care provider. Make sure you discuss any questions you have with your health care provider.   Document Released: 02/07/2000 Document Revised: 10/12/2012 Document Reviewed: 07/19/2012 Elsevier Interactive Patient Education [employer].

## 2015-06-06 NOTE — Progress Notes (Signed)
Patient ID: [name], female   DOB: 04-05-1950, 65 y.o.   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 There is no surrounding erythema or edema Bleeding callus first MPJ right, first MPJ left, fifth MPJ left Nucleated keratoses left heel  Assessment: Superficial ulceration without clinical sign of infection, fifth MPJ right Pre-ulcerative calluses 3 Diabetic peripheral neuropathy  Plan: Debrided ulcer fifth MPJ right and apply Silvadene dressing. Patient will continue apply Silvadene dressing until healed Debrided pre-ulcerative calluses 3 Dispensed surgical shoe to wear on the right foot  Reappoint 2 weeks

## 2015-06-10 ENCOUNTER — Telehealth: Payer: Self-pay | Admitting: Pharmacist

## 2015-06-10 NOTE — Telephone Encounter (Signed)
Calling to follow up after starting atorvastatin 03/14/15. Patient reports tolerability and states he has no concerns at this time. Consistent refills per [facility] pharmacy

## 2015-06-19 ENCOUNTER — Encounter: Payer: Self-pay | Admitting: Podiatry

## 2015-06-19 ENCOUNTER — Ambulatory Visit (INDEPENDENT_AMBULATORY_CARE_PROVIDER_SITE_OTHER): Payer: Medicaid Other | Admitting: Podiatry

## 2015-06-19 VITALS — BP 133/62 | HR 69 | Temp 96.7°F | Resp 12

## 2015-06-19 DIAGNOSIS — L97501 Non-pressure chronic ulcer of other part of unspecified foot limited to breakdown of skin: Secondary | ICD-10-CM | POA: Diagnosis not present

## 2015-06-19 DIAGNOSIS — L84 Corns and callosities: Secondary | ICD-10-CM

## 2015-06-19 DIAGNOSIS — E1142 Type 2 diabetes mellitus with diabetic polyneuropathy: Secondary | ICD-10-CM

## 2015-06-19 NOTE — Patient Instructions (Signed)
Apply Silvadene cream to skin ulcer on the bottom right foot daily and cover with gauze Wear the surgical shoe on the right foot

## 2015-06-20 NOTE — Progress Notes (Signed)
Patient ID: [name], female   DOB: 1950/08/01, 65 y.o.   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 Previous visit ulcer size sub-fifth right MP was 5 mm on June 05, 2015   Objective: Orientated 3 Bleeding callus sub-fifth right MPJ that breaks down to a 3 mmsuperficial ulcer. There is no surrounding erythema or edema Bleeding callus first MPJ right, first MPJ left, fifth MPJ left Nucleated keratoses left heel  Assessment: Superficial ulceration without clinical sign of infection, fifth MPJ right Pre-ulcerative calluses 3 Diabetic peripheral neuropathy  Plan: Debrided ulcer fifth MPJ right and apply Silvadene dressing. Patient will continue apply Silvadene dressing until healed Debrided pre-ulcerative calluses 3 Continue wearing the surgical shoe to wear on the right foot  Reappoint 2 weeks

## 2015-07-03 ENCOUNTER — Ambulatory Visit (INDEPENDENT_AMBULATORY_CARE_PROVIDER_SITE_OTHER): Payer: Medicaid Other | Admitting: Podiatry

## 2015-07-03 ENCOUNTER — Encounter: Payer: Self-pay | Admitting: Podiatry

## 2015-07-03 VITALS — BP 145/64 | HR 70 | Resp 12

## 2015-07-03 DIAGNOSIS — B351 Tinea unguium: Secondary | ICD-10-CM | POA: Diagnosis not present

## 2015-07-03 DIAGNOSIS — L97501 Non-pressure chronic ulcer of other part of unspecified foot limited to breakdown of skin: Secondary | ICD-10-CM

## 2015-07-03 DIAGNOSIS — M79676 Pain in unspecified toe(s): Secondary | ICD-10-CM

## 2015-07-03 NOTE — Patient Instructions (Signed)
Wear surgical shoe on right foot Apply Silvadene cream to ulcer on bottom of right foot if you notice drainage  Diabetes and Foot Care Diabetes may cause you to have problems because of poor blood supply (circulation) to your feet and legs. This may cause the skin on your feet to become thinner, break easier, and heal more slowly. Your skin may become dry, and the skin may peel and crack. You may also have nerve damage in your legs and feet causing decreased feeling in them. You may not notice minor injuries to your feet that could lead to infections or more serious problems. Taking care of your feet is one of the most important things you can do for yourself.  HOME CARE INSTRUCTIONS  Wear shoes at all times, even in the house. Do not go barefoot. Bare feet are easily injured.  Check your feet daily for blisters, cuts, and redness. If you cannot see the bottom of your feet, use a mirror or ask someone for help.  Wash your feet with warm water (do not use hot water) and mild soap. Then pat your feet and the areas between your toes until they are completely dry. Do not soak your feet as this can dry your skin.  Apply a moisturizing lotion or petroleum jelly (that does not contain alcohol and is unscented) to the skin on your feet and to dry, brittle toenails. Do not apply lotion between your toes.  Trim your toenails straight across. Do not dig under them or around the cuticle. File the edges of your nails with an emery board or nail file.  Do not cut corns or calluses or try to remove them with medicine.  Wear clean socks or stockings every day. Make sure they are not too tight. Do not wear knee-high stockings since they may decrease blood flow to your legs.  Wear shoes that fit properly and have enough cushioning. To break in new shoes, wear them for just a few hours a day. This prevents you from injuring your feet. Always look in your shoes before you put them on to be sure there are no objects  inside.  Do not cross your legs. This may decrease the blood flow to your feet.  If you find a minor scrape, cut, or break in the skin on your feet, keep it and the skin around it clean and dry. These areas may be cleansed with mild soap and water. Do not cleanse the area with peroxide, alcohol, or iodine.  When you remove an adhesive bandage, be sure not to damage the skin around it.  If you have a wound, look at it several times a day to make sure it is healing.  Do not use heating pads or hot water bottles. They may burn your skin. If you have lost feeling in your feet or legs, you may not know it is happening until it is too late.  Make sure your health care provider performs a complete foot exam at least annually or more often if you have foot problems. Report any cuts, sores, or bruises to your health care provider immediately. SEEK MEDICAL CARE IF:   You have an injury that is not healing.  You have cuts or breaks in the skin.  You have an ingrown nail.  You notice redness on your legs or feet.  You feel burning or tingling in your legs or feet.  You have pain or cramps in your legs and feet.  Your legs  or feet are numb.  Your feet always feel cold. SEEK IMMEDIATE MEDICAL CARE IF:   There is increasing redness, swelling, or pain in or around a wound.  There is a red line that goes up your leg.  Pus is coming from a wound.  You develop a fever or as directed by your health care provider.  You notice a bad smell coming from an ulcer or wound.   This information is not intended to replace advice given to you by your health care provider. Make sure you discuss any questions you have with your health care provider.   Document Released: 02/07/2000 Document Revised: 10/12/2012 Document Reviewed: 07/19/2012 Elsevier Interactive Patient Education [employer].

## 2015-07-04 NOTE — Progress Notes (Signed)
Patient ID: [name], female   DOB: 11-12-1950, 65 y.o.   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 Previous ulcer size on the visit of 06/19/2015 was 3 mm  Objective: Orientated 3 Bleeding callus sub-fifth right MPJ that breaks down to a 2 mm ulcer with a granular base. There is no surrounding erythema or edema Bleeding callus first MPJ right, first MPJ left, fifth MPJ left Nucleated keratoses left heel  Assessment: Superficial ulceration without clinical sign of infection and reducing in size sub- fifth MPJ right Pre-ulcerative calluses 3 Diabetic peripheral neuropathy  Plan: Debrided ulcer fifth MPJ right and apply Silvadene dressing. Patient will continue apply Silvadene dressing until healed Debrided pre-ulcerative calluses 3 Continue wearing surgical shoe to wear on the right foot  Reappoint 3 weeks

## 2015-07-24 ENCOUNTER — Ambulatory Visit: Payer: Medicaid Other | Admitting: Podiatry

## 2015-07-25 ENCOUNTER — Encounter: Payer: Medicaid Other | Admitting: Internal Medicine

## 2015-07-26 ENCOUNTER — Encounter: Payer: Self-pay | Admitting: Internal Medicine

## 2015-08-07 ENCOUNTER — Encounter: Payer: Self-pay | Admitting: Podiatry

## 2015-08-07 ENCOUNTER — Ambulatory Visit (INDEPENDENT_AMBULATORY_CARE_PROVIDER_SITE_OTHER): Payer: Medicaid Other | Admitting: Podiatry

## 2015-08-07 DIAGNOSIS — E1142 Type 2 diabetes mellitus with diabetic polyneuropathy: Secondary | ICD-10-CM

## 2015-08-07 DIAGNOSIS — I739 Peripheral vascular disease, unspecified: Secondary | ICD-10-CM

## 2015-08-07 DIAGNOSIS — L84 Corns and callosities: Secondary | ICD-10-CM | POA: Diagnosis not present

## 2015-08-07 NOTE — Patient Instructions (Signed)
Diabetes and Foot Care Diabetes may cause you to have problems because of poor blood supply (circulation) to your feet and legs. This may cause the skin on your feet to become thinner, break easier, and heal more slowly. Your skin may become dry, and the skin may peel and crack. You may also have nerve damage in your legs and feet causing decreased feeling in them. You may not notice minor injuries to your feet that could lead to infections or more serious problems. Taking care of your feet is one of the most important things you can do for yourself.  HOME CARE INSTRUCTIONS  Wear shoes at all times, even in the house. Do not go barefoot. Bare feet are easily injured.  Check your feet daily for blisters, cuts, and redness. If you cannot see the bottom of your feet, use a mirror or ask someone for help.  Wash your feet with warm water (do not use hot water) and mild soap. Then pat your feet and the areas between your toes until they are completely dry. Do not soak your feet as this can dry your skin.  Apply a moisturizing lotion or petroleum jelly (that does not contain alcohol and is unscented) to the skin on your feet and to dry, brittle toenails. Do not apply lotion between your toes.  Trim your toenails straight across. Do not dig under them or around the cuticle. File the edges of your nails with an emery board or nail file.  Do not cut corns or calluses or try to remove them with medicine.  Wear clean socks or stockings every day. Make sure they are not too tight. Do not wear knee-high stockings since they may decrease blood flow to your legs.  Wear shoes that fit properly and have enough cushioning. To break in new shoes, wear them for just a few hours a day. This prevents you from injuring your feet. Always look in your shoes before you put them on to be sure there are no objects inside.  Do not cross your legs. This may decrease the blood flow to your feet.  If you find a minor scrape,  cut, or break in the skin on your feet, keep it and the skin around it clean and dry. These areas may be cleansed with mild soap and water. Do not cleanse the area with peroxide, alcohol, or iodine.  When you remove an adhesive bandage, be sure not to damage the skin around it.  If you have a wound, look at it several times a day to make sure it is healing.  Do not use heating pads or hot water bottles. They may burn your skin. If you have lost feeling in your feet or legs, you may not know it is happening until it is too late.  Make sure your health care provider performs a complete foot exam at least annually or more often if you have foot problems. Report any cuts, sores, or bruises to your health care provider immediately. SEEK MEDICAL CARE IF:   You have an injury that is not healing.  You have cuts or breaks in the skin.  You have an ingrown nail.  You notice redness on your legs or feet.  You feel burning or tingling in your legs or feet.  You have pain or cramps in your legs and feet.  Your legs or feet are numb.  Your feet always feel cold. SEEK IMMEDIATE MEDICAL CARE IF:   There is increasing redness,   swelling, or pain in or around a wound.  There is a red line that goes up your leg.  Pus is coming from a wound.  You develop a fever or as directed by your health care provider.  You notice a bad smell coming from an ulcer or wound.   This information is not intended to replace advice given to you by your health care provider. Make sure you discuss any questions you have with your health care provider.   Document Released: 02/07/2000 Document Revised: 10/12/2012 Document Reviewed: 07/19/2012 Elsevier Interactive Patient Education 2016 [employer]  

## 2015-08-07 NOTE — Progress Notes (Signed)
Patient ID: [name], female   DOB: October 23, 1950, 65 y.o.   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 Previous ulcer size on the visit of 06/19/2015 was 3 mm  Objective: Orientated 3 Bleeding callus sub-fifth right MPJ that remains closed after debridement There is no surrounding erythema or edema Bleeding callus first MPJ right, first MPJ left, fifth MPJ left Nucleated keratoses left heel  Assessment: Close ulcer fifth MPJ right Pre-ulcerative calluses 4 Diabetic peripheral neuropathy Peripheral arterial disease  Plan: Debrided pre-ulcerative calluses 4   Reappoint 3 weeks

## 2015-08-09 ENCOUNTER — Other Ambulatory Visit: Payer: Self-pay

## 2015-08-09 NOTE — Telephone Encounter (Signed)
Called pt, informed her [name] was stopped 03/31/2015, states she started it back up and would like some more, ask her to discuss with md at appt, offered appt for mon refused stated she would wait til thurs to see dr [place], ask her to call for any problems before thurs 6/22, agreed

## 2015-08-09 NOTE — Telephone Encounter (Signed)
Pt requesting Januvia to be filled @ [facility] on [address].

## 2015-08-15 ENCOUNTER — Encounter: Payer: Self-pay | Admitting: Internal Medicine

## 2015-08-15 ENCOUNTER — Telehealth: Payer: Self-pay

## 2015-08-15 ENCOUNTER — Ambulatory Visit (INDEPENDENT_AMBULATORY_CARE_PROVIDER_SITE_OTHER): Payer: Medicaid Other | Admitting: Internal Medicine

## 2015-08-15 VITALS — BP 139/49 | HR 66 | Temp 98.1°F | Ht 64.4 in | Wt 134.7 lb

## 2015-08-15 DIAGNOSIS — E1140 Type 2 diabetes mellitus with diabetic neuropathy, unspecified: Secondary | ICD-10-CM

## 2015-08-15 DIAGNOSIS — E785 Hyperlipidemia, unspecified: Secondary | ICD-10-CM

## 2015-08-15 DIAGNOSIS — Z0000 Encounter for general adult medical examination without abnormal findings: Secondary | ICD-10-CM

## 2015-08-15 DIAGNOSIS — R634 Abnormal weight loss: Secondary | ICD-10-CM | POA: Diagnosis not present

## 2015-08-15 DIAGNOSIS — K8689 Other specified diseases of pancreas: Secondary | ICD-10-CM

## 2015-08-15 DIAGNOSIS — I129 Hypertensive chronic kidney disease with stage 1 through stage 4 chronic kidney disease, or unspecified chronic kidney disease: Secondary | ICD-10-CM | POA: Diagnosis not present

## 2015-08-15 DIAGNOSIS — Z79891 Long term (current) use of opiate analgesic: Secondary | ICD-10-CM | POA: Diagnosis not present

## 2015-08-15 DIAGNOSIS — E1122 Type 2 diabetes mellitus with diabetic chronic kidney disease: Secondary | ICD-10-CM | POA: Diagnosis not present

## 2015-08-15 DIAGNOSIS — Z79899 Other long term (current) drug therapy: Secondary | ICD-10-CM | POA: Diagnosis not present

## 2015-08-15 DIAGNOSIS — I10 Essential (primary) hypertension: Secondary | ICD-10-CM

## 2015-08-15 DIAGNOSIS — M159 Polyosteoarthritis, unspecified: Secondary | ICD-10-CM

## 2015-08-15 DIAGNOSIS — N183 Chronic kidney disease, stage 3 (moderate): Secondary | ICD-10-CM

## 2015-08-15 DIAGNOSIS — Z794 Long term (current) use of insulin: Secondary | ICD-10-CM | POA: Diagnosis not present

## 2015-08-15 DIAGNOSIS — M150 Primary generalized (osteo)arthritis: Secondary | ICD-10-CM | POA: Diagnosis not present

## 2015-08-15 LAB — POCT GLYCOSYLATED HEMOGLOBIN (HGB A1C): Hemoglobin A1C: 6.9

## 2015-08-15 LAB — GLUCOSE, CAPILLARY: Glucose-Capillary: 194 mg/dL — ABNORMAL HIGH (ref 65–99)

## 2015-08-15 MED ORDER — SITAGLIPTIN PHOSPHATE 50 MG PO TABS: 50.0000 mg | ORAL_TABLET | Freq: Every day | ORAL | Status: DC

## 2015-08-15 MED ORDER — HYDROCODONE-ACETAMINOPHEN 5-325 MG PO TABS: 1.0000 | ORAL_TABLET | Freq: Four times a day (QID) | ORAL | Status: DC | PRN

## 2015-08-15 NOTE — Progress Notes (Signed)
[facility] Subjective:   Patient ID: [name] female   DOB: 12-22-1950 65 y.o.   MRN: [id number]  HPI: [name] is a 65 y.o. female with a PMH detailed below who presents for 3 month follow up of HTN and DM  Please see problem based charting below for the status of her chronic medical problems.    Past Medical History  Diagnosis Date  . Diabetes mellitus   . Hypertension   . ANEMIA, CHRONIC DISEASE NEC 03/15/2006  . DEGENERATIVE JOINT DISEASE 05/06/2007    On going for >5 yrs No imaging to confirm Has tried ultram, mobic, neurontin which did not work Ambulance person tried once worked well    . 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 DIABETIC  RETINOPATHY 03/15/2006  . Hyperlipidemia 04/01/2011  . PANCREATITIS, CHRONIC 03/29/2006  . RENAL INSUFFICIENCY, CHRONIC 03/15/2006  . Peripheral arterial disease ([place])   . Hemorrhoids   . Diastolic dysfunction 80/99/8338    Grade 1 by Echocardiogram 12/13/14  . Moderate aortic regurgitation 09/25/2008    12/13/14 Echocardiogram     Current Outpatient Prescriptions  Medication Sig Dispense Refill  . albuterol (PROVENTIL HFA) 108 (90 BASE) MCG/ACT inhaler Inhale 2 puffs into the lungs every 6 (six) hours as needed for wheezing. 6.7 g 6  . atorvastatin (LIPITOR) 40 MG tablet Take 1 tablet (40 mg total) by mouth daily. (Patient taking differently: Take 40 mg by mouth daily at 6 PM. ) 90 tablet 3  . Blood Glucose Monitoring Suppl (ACCU-CHEK AVIVA PLUS) w/Device KIT Use to check you blood sugar 3 times a day 1 kit 1  . glucose blood (ACCU-CHEK AVIVA) test strip Use as instructed 100 each 12  . HYDROcodone-acetaminophen (NORCO) 5-325 MG tablet Take 1 tablet by mouth every 6 (six) hours as needed for moderate pain. 60 tablet 0  . insulin glargine (LANTUS) 100 UNIT/ML injection Inject 10 Units into the skin every morning.     Marland Kitchen ipratropium (ATROVENT HFA) 17 MCG/ACT inhaler inhale 1 to 2 puffs every 6 hours if needed  (Patient taking differently: Inhale 1-2 puffs into the lungs every 6 (six) hours as needed for wheezing. inhale 1 to 2 puffs every 6 hours if needed) 12.9 g 0  . losartan (COZAAR) 50 MG tablet Take 1 tablet (50 mg total) by mouth 2 (two) times daily. 180 tablet 1  . nicotine polacrilex (NICORETTE) 2 MG gum Take 1 each (2 mg total) by mouth as needed for smoking cessation. (Patient not taking: Reported on 04/20/2015) 100 tablet 1  . Pancrelipase, Lip-Prot-Amyl, 24000 units CPEP Take 3 tablets With meals and 2 tablets with snacks. No more than 10/24hours 180 capsule 11  . polyethylene glycol (MIRALAX / GLYCOLAX) packet Take 17 g by mouth daily as needed for mild constipation. 14 each 5  . pregabalin (LYRICA) 75 MG capsule Take 1 capsule (75 mg total) by mouth 2 (two) times daily. 60 capsule 5  . promethazine (PHENERGAN) 12.5 MG tablet Take 1 tablet (12.5 mg total) by mouth every 6 (six) hours as needed for nausea or vomiting. 60 tablet 2  . sitaGLIPtin (JANUVIA) 50 MG tablet Take 1 tablet (50 mg total) by mouth daily. 90 tablet 1  . travoprost, benzalkonium, (TRAVATAN) 0.004 % ophthalmic solution Place 1 drop into both eyes at bedtime.     No current facility-administered medications for this visit.   No family history on file. Social History   Social History  . Marital Status: Single  Spouse Name: N/A  . Number of Children: N/A  . Years of Education: N/A   Social History Main Topics  . Smoking status: Current Some Day Smoker -- 0.50 packs/day    Types: Cigarettes  . Smokeless tobacco: Never Used  . Alcohol Use: No  . Drug Use: No  . Sexual Activity: Not Asked   Other Topics Concern  . None   Social History Narrative   Review of Systems: Review of Systems  Constitutional: Positive for weight loss. Negative for fever, chills and malaise/fatigue.  Respiratory: Negative for cough.   Cardiovascular: Negative for chest pain.  Gastrointestinal: Positive for diarrhea. Negative for  vomiting, abdominal pain and blood in stool.  Genitourinary: Negative for dysuria.  Musculoskeletal: Negative for myalgias.     Objective:  Physical Exam: Filed Vitals:   08/15/15 1329  BP: 139/49  Pulse: 66  Temp: 98.1 F (36.7 C)  TempSrc: Oral  Height: 5' 4.4" (1.636 m)  Weight: 134 lb 11.2 oz (61.1 kg)  SpO2: 100%  Physical Exam  Constitutional: She is oriented to person, place, and time and well-developed, well-nourished, and in no distress.  Eyes: Conjunctivae are normal.  Cardiovascular: Normal rate and regular rhythm.   Murmur heard. Pulmonary/Chest: Effort normal and breath sounds normal.  Abdominal: Soft. Bowel sounds are normal. There is no tenderness.  Musculoskeletal: She exhibits no edema.  Neurological: She is alert and oriented to person, place, and time.  Skin: Skin is warm and dry.  Nursing note and vitals reviewed.   Assessment & Plan:  Case discussed with Dr. Daryll Drown  Essential hypertension HPI: Tolerating medications no complaints  A: Essential HTN at goal  P: Continue current medications  Type 2 diabetes mellitus, controlled, with renal complications (HCC) HPI: Sometimes skips lantus based on how she feels.  She would like to start back on Januvia even though it was not very effective.  Has lost 4 lbs since last visit.  A: Type 2 DM with CKD3  P: - Continue Lantus 10 units daily - Restart Januvia 70m daily  Diabetic neuropathy, painful (HCC) HPI: Lyrica has been effective at improving symptoms but is somewhat sedating, she is taking only at night right now.  A: Diabetic neuropahty  P: Continue Lyrica 769mQHS or BID  Osteoarthritis HPI: Norco has worked very well for her, she reports she reguarlly takes it twice a day, she has not had any issues with the medication and feels that it is adequately controlling her pain in multiple joints.  A: OA  P: Continue Norco [id number], check UDS, reports last taken yesterday.  Loss of  weight HPI: Her weight dropped in 2015 and then stablized, recently she has dropped another 4 lbs, she is concerning about this despite being at a normal BMI.  She has no specific complaints about other issues.  She continues to smoke.  A: Mild weight loss  P: Will get up to date for screening Discussed Lung CA screening and potential pitfalls, she reports she is at least a 30 pack year smoker (>40pack/year) Asked again for her to get her mammogram done Due for repeat Colonoscopy in 3 weeks, will order screening colonoscopy  Pancreatic insufficiency (HCC) HPI: She is back taking creon and doing very well, no diarrhea  A: Pancreatic insufficiency  P: Stable continue creon.    Medications Ordered Meds ordered this encounter  Medications  . sitaGLIPtin (JANUVIA) 50 MG tablet    Sig: Take 1 tablet (50 mg total) by mouth daily.  Dispense:  90 tablet    Refill:  1  . DISCONTD: HYDROcodone-acetaminophen (NORCO) 5-325 MG tablet    Sig: Take 1 tablet by mouth every 6 (six) hours as needed for moderate pain.    Dispense:  60 tablet    Refill:  0    Rx 1/3 to be filled 30 days after previous script  . DISCONTD: HYDROcodone-acetaminophen (NORCO) 5-325 MG tablet    Sig: Take 1 tablet by mouth every 6 (six) hours as needed for moderate pain.    Dispense:  60 tablet    Refill:  0    Rx 2/3 to be filled 30 days after previous script  . HYDROcodone-acetaminophen (NORCO) 5-325 MG tablet    Sig: Take 1 tablet by mouth every 6 (six) hours as needed for moderate pain.    Dispense:  60 tablet    Refill:  0    Rx 3/3 to be filled 30 days after previous script   Other Orders Orders Placed This Encounter  Procedures  . CT CHEST LUNG CA SCREEN LOW DOSE W/O CM    Standing Status: Future     Number of Occurrences:      Standing Expiration Date: 10/14/2016    Order Specific Question:  Reason for Exam (SYMPTOM  OR DIAGNOSIS REQUIRED)    Answer:  40 pack year smoking history, weight loss     Order Specific Question:  Preferred Imaging Location?    Answer:  [facility]  . MM Digital Screening    Standing Status: Future     Number of Occurrences:      Standing Expiration Date: 10/14/2016    Order Specific Question:  Reason for Exam (SYMPTOM  OR DIAGNOSIS REQUIRED)    Answer:  screening mammogram    Order Specific Question:  Preferred imaging location?    Answer:  [facility]  . BMP8+Anion Gap  . Lipid Profile  . Glucose, capillary  . ToxAssure Select,+Antidepr,UR  . Ambulatory referral to Gastroenterology    Referral Priority:  Routine    Referral Type:  Consultation    Referral Reason:  Specialty Services Required    Number of Visits Requested:  1  . POC Hbg A1C   Follow Up: Return in about 3 months (around 11/15/2015).

## 2015-08-15 NOTE — Assessment & Plan Note (Signed)
HPI: Norco has worked very well for her, she reports she reguarlly takes it twice a day, she has not had any issues with the medication and feels that it is adequately controlling her pain in multiple joints.  A: OA  P: Continue Norco 5-325mg  #60, check UDS, reports last taken yesterday.

## 2015-08-15 NOTE — Assessment & Plan Note (Signed)
HPI: Her weight dropped in 2015 and then stablized, recently she has dropped another 4 lbs, she is concerning about this despite being at a normal BMI.  She has no specific complaints about other issues.  She continues to smoke.  A: Mild weight loss  P: Will get up to date for screening Discussed Lung CA screening and potential pitfalls, she reports she is at least a 30 pack year smoker (>40pack/year) Asked again for her to get her mammogram done Due for repeat Colonoscopy in 3 weeks, will order screening colonoscopy

## 2015-08-15 NOTE — Assessment & Plan Note (Signed)
HPI: Tolerating medications no complaints  A: Essential HTN at goal  P: Continue current medications

## 2015-08-15 NOTE — Assessment & Plan Note (Signed)
HPI: Sometimes skips lantus based on how she feels.  She would like to start back on Januvia even though it was not very effective.  Has lost 4 lbs since last visit.  A: Type 2 DM with CKD3  P: - Continue Lantus 10 units daily - Restart Januvia 50mg  daily

## 2015-08-15 NOTE — Patient Instructions (Signed)
General Instructions:  I am sending you for a Mammogram, a CT scan of your lungs and a repeat Colonoscopy Please bring your medicines with you each time you come to clinic.  Medicines may include prescription medications, over-the-counter medications, herbal remedies, eye drops, vitamins, or other pills.   Progress Toward Treatment Goals:  Treatment Goal 11/29/2014  Hemoglobin A1C unchanged  Blood pressure deteriorated  Stop smoking smoking the same amount    Self Care Goals & Plans:  Self Care Goal 08/15/2015  Manage my medications take my medicines as prescribed; bring my medications to every visit; refill my medications on time  Monitor my health keep track of my blood glucose; bring my glucose meter and log to each visit  Eat healthy foods drink diet soda or water instead of juice or soda; eat more vegetables; eat foods that are low in salt; eat baked foods instead of fried foods; eat fruit for snacks and desserts  Be physically active find an activity I enjoy; take a walk every day  Stop smoking cut down the number of cigarettes smoked; set a quit date and stop smoking  Meeting treatment goals maintain the current self-care plan    Home Blood Glucose Monitoring 11/29/2014  Check my blood sugar 3 times a day  When to check my blood sugar before meals     Care Management & Community Referrals:  Referral 03/29/2014  Referrals made for care management support none needed

## 2015-08-15 NOTE — Telephone Encounter (Signed)
Called pharm, they need a PA on januvia and losartan

## 2015-08-15 NOTE — Telephone Encounter (Signed)
Pt requesting Januvia to be filled @ [facility]

## 2015-08-15 NOTE — Assessment & Plan Note (Signed)
HPI: She is back taking creon and doing very well, no diarrhea  A: Pancreatic insufficiency  P: Stable continue creon.

## 2015-08-15 NOTE — Assessment & Plan Note (Signed)
HPI: Lyrica has been effective at improving symptoms but is somewhat sedating, she is taking only at night right now.  A: Diabetic neuropahty  P: Continue Lyrica 75mg  QHS or BID

## 2015-08-16 LAB — LIPID PANEL
CHOL/HDL RATIO: 1.4 ratio (ref 0.0–4.4)
Cholesterol, Total: 137 mg/dL (ref 100–199)
HDL: 99 mg/dL (ref >39)
LDL CALC: 22 mg/dL (ref 0–99)
Triglycerides: 78 mg/dL (ref 0–149)
VLDL CHOLESTEROL CAL: 16 mg/dL (ref 5–40)

## 2015-08-16 LAB — BMP8+ANION GAP
Anion Gap: 19 mmol/L — ABNORMAL HIGH (ref 10.0–18.0)
BUN / CREAT RATIO: 27 (ref 12–28)
BUN: 35 mg/dL — AB (ref 8–27)
CALCIUM: 9 mg/dL (ref 8.7–10.3)
CO2: 17 mmol/L — ABNORMAL LOW (ref 18–29)
CREATININE: 1.31 mg/dL — AB (ref 0.57–1.00)
Chloride: 103 mmol/L (ref 96–106)
GFR, EST AFRICAN AMERICAN: 50 mL/min/{1.73_m2} — AB (ref >59)
GFR, EST NON AFRICAN AMERICAN: 43 mL/min/{1.73_m2} — AB (ref >59)
Glucose: 201 mg/dL — ABNORMAL HIGH (ref 65–99)
Potassium: 4.4 mmol/L (ref 3.5–5.2)
Sodium: 139 mmol/L (ref 134–144)

## 2015-08-19 NOTE — Telephone Encounter (Signed)
[name], can you do these today, pt is having problems with her cbg's needs the [place]

## 2015-08-20 ENCOUNTER — Telehealth: Payer: Self-pay

## 2015-08-20 DIAGNOSIS — I10 Essential (primary) hypertension: Secondary | ICD-10-CM

## 2015-08-20 NOTE — Progress Notes (Signed)
Internal Medicine Clinic Attending  Case discussed with Dr. Hoffman soon after the resident saw the patient.  We reviewed the resident's history and exam and pertinent patient test results.  I agree with the assessment, diagnosis, and plan of care documented in the resident's note. 

## 2015-08-20 NOTE — Telephone Encounter (Signed)
Please call pt regarding losartan and Januvia.

## 2015-08-21 ENCOUNTER — Ambulatory Visit: Payer: Medicaid Other | Admitting: Podiatry

## 2015-08-21 MED ORDER — LOSARTAN POTASSIUM 100 MG PO TABS
100.0000 mg | ORAL_TABLET | Freq: Every day | ORAL | Status: DC
Start: 2015-08-21 — End: 2016-01-23

## 2015-08-21 NOTE — Telephone Encounter (Signed)
Losartan PA submitted online via [place] [facility]- Pt has failed lisinopril-request sent for review.   Of note pt's previous rx was for Losartan 50mg  take one twice daily this was changed to Losartan 100mg  once daily, pt aware, discussed with Dr Lynnae January.Despina Hidden Cassady6/28/20174:46 PM    [id number] [facility]  [id number] [address]  08/21/2015 SUSPENDED

## 2015-08-21 NOTE — Telephone Encounter (Signed)
Januvia PA request submitted online via [place] [employer].  Request sent for review.Despina Hidden Cassady6/28/20174:55 PM    [id number] [facility]  [id number] [address]  08/21/2015 SUSPENDED [facility]

## 2015-08-26 ENCOUNTER — Encounter: Payer: Self-pay | Admitting: *Deleted

## 2015-08-28 ENCOUNTER — Ambulatory Visit: Payer: Medicaid Other | Admitting: Podiatry

## 2015-08-28 ENCOUNTER — Telehealth: Payer: Self-pay

## 2015-08-28 NOTE — Telephone Encounter (Signed)
appt given at her request next wed 7/12 for abd pain w/ BMs

## 2015-08-28 NOTE — Telephone Encounter (Signed)
Pt needs to speak with a nurse regarding stomach pain.

## 2015-08-29 ENCOUNTER — Emergency Department (HOSPITAL_COMMUNITY)
Admission: EM | Admit: 2015-08-29 | Discharge: 2015-08-29 | Disposition: A | Discharge status: Home / Self Care | Payer: Medicaid Other | Attending: Emergency Medicine | Admitting: Emergency Medicine

## 2015-08-29 ENCOUNTER — Encounter (HOSPITAL_COMMUNITY): Payer: Self-pay | Admitting: Emergency Medicine

## 2015-08-29 DIAGNOSIS — Z794 Long term (current) use of insulin: Secondary | ICD-10-CM | POA: Insufficient documentation

## 2015-08-29 DIAGNOSIS — F17210 Nicotine dependence, cigarettes, uncomplicated: Secondary | ICD-10-CM | POA: Insufficient documentation

## 2015-08-29 DIAGNOSIS — X58XXXA Exposure to other specified factors, initial encounter: Secondary | ICD-10-CM | POA: Insufficient documentation

## 2015-08-29 DIAGNOSIS — Z79899 Other long term (current) drug therapy: Secondary | ICD-10-CM | POA: Insufficient documentation

## 2015-08-29 DIAGNOSIS — H5711 Ocular pain, right eye: Secondary | ICD-10-CM | POA: Diagnosis present

## 2015-08-29 DIAGNOSIS — E1140 Type 2 diabetes mellitus with diabetic neuropathy, unspecified: Secondary | ICD-10-CM | POA: Diagnosis not present

## 2015-08-29 DIAGNOSIS — E11319 Type 2 diabetes mellitus with unspecified diabetic retinopathy without macular edema: Secondary | ICD-10-CM | POA: Insufficient documentation

## 2015-08-29 DIAGNOSIS — Y939 Activity, unspecified: Secondary | ICD-10-CM | POA: Diagnosis not present

## 2015-08-29 DIAGNOSIS — Z7984 Long term (current) use of oral hypoglycemic drugs: Secondary | ICD-10-CM | POA: Diagnosis not present

## 2015-08-29 DIAGNOSIS — S0501XA Injury of conjunctiva and corneal abrasion without foreign body, right eye, initial encounter: Secondary | ICD-10-CM

## 2015-08-29 DIAGNOSIS — I10 Essential (primary) hypertension: Secondary | ICD-10-CM | POA: Diagnosis not present

## 2015-08-29 DIAGNOSIS — Y999 Unspecified external cause status: Secondary | ICD-10-CM | POA: Diagnosis not present

## 2015-08-29 DIAGNOSIS — Y929 Unspecified place or not applicable: Secondary | ICD-10-CM | POA: Diagnosis not present

## 2015-08-29 DIAGNOSIS — E785 Hyperlipidemia, unspecified: Secondary | ICD-10-CM | POA: Diagnosis not present

## 2015-08-29 MED ORDER — POLYMYXIN B-TRIMETHOPRIM 10000-0.1 UNIT/ML-% OP SOLN: 1.0000 [drp] | OPHTHALMIC | Status: DC

## 2015-08-29 MED ORDER — TETRACAINE HCL 0.5 % OP SOLN
2.0000 [drp] | Freq: Once | OPHTHALMIC | Status: AC
  Administered 2015-08-29: 2 [drp] via OPHTHALMIC
  Filled 2015-08-29: qty 2

## 2015-08-29 MED ORDER — FLUORESCEIN SODIUM 1 MG OP STRP
1.0000 | ORAL_STRIP | Freq: Once | OPHTHALMIC | Status: AC
  Administered 2015-08-29: 1 via OPHTHALMIC
  Filled 2015-08-29: qty 1

## 2015-08-29 NOTE — ED Provider Notes (Signed)
CSN: [id number]     Arrival date & time 08/29/15  1924 History  By signing my name below, I, [facility], attest that this documentation has been prepared under the direction and in the presence of Gay Filler, PA-C. Electronically Signed: Virgel Bouquet, ED Scribe. 08/29/2015. 10:36 PM.   Chief Complaint  Patient presents with  . Eye Pain    The history is provided by the patient. No language interpreter was used.   HPI Comments: [name] is a 65 y.o. female with a hx of DM, HTN, HLN, glaucoma, and diabetic retinopathy who presents to the Emergency Department complaining of constant, moderate, right eye pain onset this morning upon waking. Pt states woke this morning with a foreign body sensation in her right eye with erythema and pain in the same eye. She reports associated watery eye discharge and blurry vision. Pain is worse with opening the eye. She has flushed the eye with water, used Travatan drops, and applied cool compress without relief. Denies pruritis or photophobia. No fever chills, night sweats. No recent URI sxs. No headache, nausea, or vomiting. She does not wear contacts. No known trauma. No sick contacts.   Past Medical History  Diagnosis Date  . Diabetes mellitus   . Hypertension   . ANEMIA, CHRONIC DISEASE NEC 03/15/2006  . DEGENERATIVE JOINT DISEASE 05/06/2007    On going for >5 yrs No imaging to confirm Has tried ultram, mobic, neurontin which did not work Ambulance person tried once worked well    . 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 DIABETIC  RETINOPATHY 03/15/2006  . Hyperlipidemia 04/01/2011  . PANCREATITIS, CHRONIC 03/29/2006  . RENAL INSUFFICIENCY, CHRONIC 03/15/2006  . Peripheral arterial disease ([place])   . Hemorrhoids   . Diastolic dysfunction 19/41/7408    Grade 1 by Echocardiogram 12/13/14  . Moderate aortic regurgitation 09/25/2008    12/13/14 Echocardiogram     Past Surgical History  Procedure Laterality Date  . Breast surgery    . Mouth  surgery N/A    History reviewed. No pertinent family history. Social History  Substance Use Topics  . Smoking status: Current Some Day Smoker -- 0.50 packs/day    Types: Cigarettes  . Smokeless tobacco: Never Used  . Alcohol Use: No   OB History    No data available     Review of Systems  Constitutional: Negative for fever, chills and diaphoresis.  Eyes: Positive for pain, discharge, redness and visual disturbance ( "blurry"). Negative for photophobia and itching.  Gastrointestinal: Negative for nausea and vomiting.  Neurological: Negative for headaches.      Allergies  Gabapentin; Januvia; and Nicorette  Home Medications   Prior to Admission medications   Medication Sig Start Date End Date Taking? Authorizing Provider  albuterol (PROVENTIL HFA) 108 (90 BASE) MCG/ACT inhaler Inhale 2 puffs into the lungs every 6 (six) hours as needed for wheezing. 12/25/14 12/25/15  Lucious Groves, DO  atorvastatin (LIPITOR) 40 MG tablet Take 1 tablet (40 mg total) by mouth daily. Patient taking differently: Take 40 mg by mouth daily at 6 PM.  03/14/15 03/13/16  Lucious Groves, DO  Blood Glucose Monitoring Suppl (ACCU-CHEK AVIVA PLUS) w/Device KIT Use to check you blood sugar 3 times a day 06/04/15   Oval Linsey, MD  glucose blood (ACCU-CHEK AVIVA) test strip Use as instructed 05/09/15   Lucious Groves, DO  HYDROcodone-acetaminophen (NORCO) 5-325 MG tablet Take 1 tablet by mouth every 6 (six) hours as needed for moderate pain. 08/15/15  Lucious Groves, DO  insulin glargine (LANTUS) 100 UNIT/ML injection Inject 10 Units into the skin every morning.     Historical Provider, MD  ipratropium (ATROVENT HFA) 17 MCG/ACT inhaler inhale 1 to 2 puffs every 6 hours if needed Patient taking differently: Inhale 1-2 puffs into the lungs every 6 (six) hours as needed for wheezing. inhale 1 to 2 puffs every 6 hours if needed 12/27/14   Lucious Groves, DO  losartan (COZAAR) 100 MG tablet Take 1 tablet (100 mg  total) by mouth daily. 08/21/15 08/20/16  Bartholomew Crews, MD  nicotine polacrilex (NICORETTE) 2 MG gum Take 1 each (2 mg total) by mouth as needed for smoking cessation. Patient not taking: Reported on 04/20/2015 11/29/14   Lucious Groves, DO  Pancrelipase, Lip-Prot-Amyl, 24000 units CPEP Take 3 tablets With meals and 2 tablets with snacks. No more than 10/24hours 05/09/15   Lucious Groves, DO  polyethylene glycol (MIRALAX / GLYCOLAX) packet Take 17 g by mouth daily as needed for mild constipation. 09/05/14   Lucious Groves, DO  pregabalin (LYRICA) 75 MG capsule Take 1 capsule (75 mg total) by mouth 2 (two) times daily. 05/09/15 05/08/16  Lucious Groves, DO  promethazine (PHENERGAN) 12.5 MG tablet Take 1 tablet (12.5 mg total) by mouth every 6 (six) hours as needed for nausea or vomiting. 05/09/15   Lucious Groves, DO  sitaGLIPtin (JANUVIA) 50 MG tablet Take 1 tablet (50 mg total) by mouth daily. 08/15/15   Lucious Groves, DO  travoprost, benzalkonium, (TRAVATAN) 0.004 % ophthalmic solution Place 1 drop into both eyes at bedtime.    Historical Provider, MD  trimethoprim-polymyxin b (POLYTRIM) ophthalmic solution Place 1 drop into the right eye every 4 (four) hours. For seven days 08/29/15   Roxanna Mew, PA-C   BP 158/71 mmHg  Pulse 77  Temp(Src) 98.3 F (36.8 C) (Oral)  Resp 16  Ht 5' 5" (1.651 m)  Wt 134 lb (60.782 kg)  BMI 22.30 kg/m2  SpO2 100% Physical Exam  Constitutional: She appears well-developed and well-nourished. No distress.  HENT:  Head: Normocephalic and atraumatic.  Eyes: EOM are normal. Pupils are equal, round, and reactive to light. Lids are everted and swept, no foreign bodies found. Right eye exhibits discharge ( watery). Right eye exhibits no chemosis, no exudate and no hordeolum. No foreign body present in the right eye. Left eye exhibits no chemosis, no discharge, no exudate and no hordeolum. No foreign body present in the left eye. Right conjunctiva is injected. Right  conjunctiva has no hemorrhage. Left conjunctiva is not injected. Left conjunctiva has no hemorrhage. No scleral icterus.  Slit lamp exam:      The right eye shows corneal abrasion and fluorescein uptake. The right eye shows no corneal ulcer, no foreign body, no hyphema and no hypopyon.  Visual acuity 20/25 right, 20/50 left. No proptosis. No eyelid swelling. PERRL. EOMs intact. Pressure in right eye 12. Corneal abrasion of right eye. No hyphema or foreign body. No dendritic lesions.   Neck: Normal range of motion.  Pulmonary/Chest: Effort normal. No respiratory distress.  Neurological: She is alert.  Skin: Skin is warm and dry. She is not diaphoretic.  Psychiatric: She has a normal mood and affect. Her behavior is normal.  Nursing note and vitals reviewed.   ED Course  Procedures   DIAGNOSTIC STUDIES: Oxygen Saturation is 100% on RA, normal by my interpretation.    COORDINATION OF CARE: 7:50 PM Discussed  treatment plan with pt at bedside and pt agreed to plan.  10:37 PM Consulted with attending MD Dr. Shirlyn Goltz who examined the pt. Will prescribe Polytrim. Will discharge pt.   MDM   Final diagnoses:  Corneal abrasion, right, initial encounter    Pt is afebrile and non-toxic appearing in NAD. Blood pressure elevated, vital signs otherwise stable. Right conjunctival injection. PERRL. EOM intact. IOP 12.  Pain improved following tetracaine drop. Pt with corneal abrasion on PE. No evidence of FB. Pt is not a contact lens wearer.  Exam non-concerning for orbital cellulitis, hyphema, or corneal ulcers. Pt also seen and evaluated by Dr. Darl Householder. Patient will be discharged home with polytrim. Patient understands to follow up with ophthalmology & to return to ER if new symptoms develop including change in vision, purulent drainage, or entrapment.   I personally performed the services described in this documentation, which was scribed in my presence. The recorded information has been reviewed and  is accurate.    Roxanna Mew, PA-C 08/30/15 [place] Mikalah Skyles, PA-C 08/30/15 0401  Wandra Arthurs, MD 08/30/15 [phone]

## 2015-08-29 NOTE — ED Notes (Signed)
Patient arrives with complaint of right eye pain. History of glaucoma. Denies injury. Vision intact, but patient states eye is painful to keep open.

## 2015-08-29 NOTE — Discharge Instructions (Signed)
Read the information below.   You have a corneal abrasion. You are being prescribed an antibiotic drop. Take as directed. It is important that you follow up with your ophthalmologist within the next week for a re-evaluation.  Use the prescribed medication as directed.  Please discuss all new medications with your pharmacist.   You may return to the Emergency Department at any time for worsening condition or any new symptoms that concern you. Return to ED if your symptoms worsen or you develop a fever, headache, nausea, or vomiting.    Corneal Abrasion The cornea is the clear covering at the front and center of the eye. When looking at the colored portion of the eye (iris), you are looking through the cornea. This very thin tissue is made up of many layers. The surface layer is a single layer of cells (corneal epithelium) and is one of the most sensitive tissues in the body. If a scratch or injury causes the corneal epithelium to come off, it is called a corneal abrasion. If the injury extends to the tissues below the epithelium, the condition is called a corneal ulcer. CAUSES   Scratches.  Trauma.  Foreign body in the eye. Some people have recurrences of abrasions in the area of the original injury even after it has healed (recurrent erosion syndrome). Recurrent erosion syndrome generally improves and goes away with time. SYMPTOMS   Eye pain.  Difficulty or inability to keep the injured eye open.  The eye becomes very sensitive to light.  Recurrent erosions tend to happen suddenly, first thing in the morning, usually after waking up and opening the eye. DIAGNOSIS  Your health care provider can diagnose a corneal abrasion during an eye exam. Dye is usually placed in the eye using a drop or a small paper strip moistened by your tears. When the eye is examined with a special light, the abrasion shows up clearly because of the dye. TREATMENT   Small abrasions may be treated with antibiotic  drops or ointment alone.  A pressure patch may be put over the eye. If this is done, follow your doctor's instructions for when to remove the patch. Do not drive or use machines while the eye patch is on. Judging distances is hard to do with a patch on. If the abrasion becomes infected and spreads to the deeper tissues of the cornea, a corneal ulcer can result. This is serious because it can cause corneal scarring. Corneal scars interfere with light passing through the cornea and cause a loss of vision in the involved eye. HOME CARE INSTRUCTIONS  Use medicine or ointment as directed. Only take over-the-counter or prescription medicines for pain, discomfort, or fever as directed by your health care provider.  Do not drive or operate machinery if your eye is patched. Your ability to judge distances is impaired.  If your health care provider has given you a follow-up appointment, it is very important to keep that appointment. Not keeping the appointment could result in a severe eye infection or permanent loss of vision. If there is any problem keeping the appointment, let your health care provider know. SEEK MEDICAL CARE IF:   You have pain, light sensitivity, and a scratchy feeling in one eye or both eyes.  Your pressure patch keeps loosening up, and you can blink your eye under the patch after treatment.  Any kind of discharge develops from the eye after treatment or if the lids stick together in the morning.  You have the  same symptoms in the morning as you did with the original abrasion days, weeks, or months after the abrasion healed.   This information is not intended to replace advice given to you by your health care provider. Make sure you discuss any questions you have with your health care provider.   Document Released: 02/07/2000 Document Revised: 10/31/2014 Document Reviewed: 10/17/2012 Elsevier Interactive Patient Education [employer].

## 2015-08-30 NOTE — Telephone Encounter (Signed)
DONE, PT AWARE

## 2015-09-04 ENCOUNTER — Ambulatory Visit: Payer: Medicaid Other

## 2015-09-04 ENCOUNTER — Encounter: Payer: Self-pay | Admitting: Internal Medicine

## 2015-09-06 ENCOUNTER — Other Ambulatory Visit: Payer: Self-pay | Admitting: Internal Medicine

## 2015-09-06 DIAGNOSIS — Z1231 Encounter for screening mammogram for malignant neoplasm of breast: Secondary | ICD-10-CM

## 2015-09-16 ENCOUNTER — Ambulatory Visit (HOSPITAL_COMMUNITY): Payer: Medicaid Other

## 2015-09-18 ENCOUNTER — Ambulatory Visit: Payer: Medicaid Other

## 2015-09-24 ENCOUNTER — Encounter: Payer: Self-pay | Admitting: Internal Medicine

## 2015-09-24 ENCOUNTER — Ambulatory Visit (INDEPENDENT_AMBULATORY_CARE_PROVIDER_SITE_OTHER): Payer: Medicaid Other | Admitting: Internal Medicine

## 2015-09-24 VITALS — BP 132/60 | HR 72 | Ht 65.0 in | Wt 136.6 lb

## 2015-09-24 DIAGNOSIS — K5900 Constipation, unspecified: Secondary | ICD-10-CM | POA: Diagnosis not present

## 2015-09-24 DIAGNOSIS — K863 Pseudocyst of pancreas: Secondary | ICD-10-CM

## 2015-09-24 DIAGNOSIS — K862 Cyst of pancreas: Secondary | ICD-10-CM

## 2015-09-24 DIAGNOSIS — K8689 Other specified diseases of pancreas: Secondary | ICD-10-CM

## 2015-09-24 DIAGNOSIS — F1010 Alcohol abuse, uncomplicated: Secondary | ICD-10-CM

## 2015-09-24 DIAGNOSIS — Z1211 Encounter for screening for malignant neoplasm of colon: Secondary | ICD-10-CM

## 2015-09-24 DIAGNOSIS — K909 Intestinal malabsorption, unspecified: Secondary | ICD-10-CM | POA: Diagnosis not present

## 2015-09-24 NOTE — Progress Notes (Signed)
HISTORY OF PRESENT ILLNESS:  [name] is a 65 y.o. 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 She has a history of pancreatic insufficiency for which she takes pancreatic enzyme supplements. She was last evaluated in the office 08/03/2014. See that dictation for details. Complaints at that time her lower abdominal discomfort with associated constipation. She was advised with regards to strict alcohol avoidance. She was told increase MiraLAX. She was scheduled for CT scan of the abdomen and pelvis but did not follow through. She was scheduled for colonoscopy but did not follow through. She presents today with ongoing problems with intermittent constipation. For her constipation she takes stool softeners, coffee, and MiraLAX every other day. She does notice lower abdominal bloating or fullness which is relieved with defecation. She does report occasional steatorrhea, though infrequent. She does claim that she is compliant with pancreatic enzyme supplementation in the form of Creon. She continues to drink beer. She has had about a 9 pound weight loss over the past year. Last colonoscopy in 2007 was unremarkable though the patient had difficulty retaining air. Last CT scan April 2016 revealed acute on chronic pancreatitis with 5 cm pseudocyst. Review of outside laboratories from 2017 revealed mild pancytopenia. No liver tests on record. No pancreas enzymes. Last hemoglobin A1c 6.9.  REVIEW OF SYSTEMS:  All non-GI ROS negative except for arthritis  Past Medical History:  Diagnosis Date  . ANEMIA, CHRONIC DISEASE NEC 03/15/2006  . DEGENERATIVE JOINT DISEASE 05/06/2007   On going for >5 yrs No imaging to confirm Has tried ultram, mobic, neurontin which did not work Ambulance person tried once worked well    . Diabetes mellitus   . DIABETIC  RETINOPATHY 03/15/2006  . 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 Diastolic  dysfunction 99991111   Grade 1 by Echocardiogram 12/13/14  . Hemorrhoids   . Hyperlipidemia 04/01/2011  . Hypertension   . Moderate aortic regurgitation 09/25/2008   12/13/14 Echocardiogram    . PANCREATITIS, CHRONIC 03/29/2006  . Peripheral arterial disease ([place])   . RENAL INSUFFICIENCY, CHRONIC 03/15/2006    Past Surgical History:  Procedure Laterality Date  . BREAST SURGERY    . MOUTH SURGERY N/A     Social History [name]  reports that she has been smoking Cigarettes.  She has been smoking about 0.50 packs per day. She has never used smokeless tobacco. She reports that she does not drink alcohol or use drugs.  family history includes Dementia in her mother.  Allergies  Allergen Reactions  . Gabapentin Swelling    Legs and feet  . Januvia [Sitagliptin] Other (See Comments)    Did not reduce blood sugar  . Nicorette [Nicotine] Nausea Only       PHYSICAL EXAMINATION: Vital signs: BP 132/60 (BP Location: Left Arm, Patient Position: Sitting, Cuff Size: Normal)   Pulse 72   Ht 5\' 5"  (1.651 m)   Wt 136 lb 9.6 oz (62 kg)   BMI 22.73 kg/m   Constitutional: generally well-appearing, no acute distress Psychiatric: alert and oriented x3, cooperative Eyes: extraocular movements intact, anicteric, conjunctiva pink Mouth: oral pharynx moist, no lesions Neck: supple no lymphadenopathy Cardiovascular: heart regular rate and rhythm, no murmur Lungs: clear to auscultation bilaterally Abdomen: soft, nontender, nondistended, no obvious ascites, no peritoneal signs, normal bowel sounds, no organomegaly Rectal: Deferred to colonoscopy Extremities: no clubbing cyanosis or lower extremity edema bilaterally Skin: no lesions on visible extremities Neuro: No focal deficits. Cranial nerves  intact. No asterixis.  ASSESSMENT:  #1. Chronic constipation. #2. Screening colonoscopy 2007 was negative. Due for follow-up #3. Chronic calcific pancreatitis secondary to alcohol. Continues to  drink #4. Intermittent steatorrhea. Explains weight loss #5. Multiple medical problems including diabetes mellitus   PLAN:  #1. Advised to take MiraLAX more regularly to address constipation #2. Screening colonoscopy. The patient is interested. Has been 10 years. #3. Stop drinking alcohol #4. Increase Creon to address intermittent steatorrhea. Titrated to need. Reviewed. #5. Hold diabetic medications for her procedure. Advise. #6. Ongoing general medical care with PCP  40 minutes spent face-to-face with the patient. Greater than 50% a time use for counseling regarding her constipation and chronic pancreas disease

## 2015-09-24 NOTE — Patient Instructions (Addendum)
Increase your Miralax daily for constipation.  You have been scheduled for a colonoscopy. Please follow written instructions given to you at your visit today.  Please pick up your prep supplies at the pharmacy within the next 1-3 days. If you use inhalers (even only as needed), please bring them with you on the day of your procedure. Your physician has requested that you go to www.startemmi.com and enter the access code given to you at your visit today. This web site gives a general overview about your procedure. However, you should still follow specific instructions given to you by our office regarding your preparation for the procedure.

## 2015-09-25 ENCOUNTER — Ambulatory Visit: Payer: Medicaid Other | Admitting: Podiatry

## 2015-10-01 ENCOUNTER — Encounter: Payer: Self-pay | Admitting: Podiatry

## 2015-10-01 ENCOUNTER — Ambulatory Visit (INDEPENDENT_AMBULATORY_CARE_PROVIDER_SITE_OTHER): Payer: Medicaid Other | Admitting: Podiatry

## 2015-10-01 DIAGNOSIS — L84 Corns and callosities: Secondary | ICD-10-CM | POA: Diagnosis not present

## 2015-10-01 DIAGNOSIS — L97501 Non-pressure chronic ulcer of other part of unspecified foot limited to breakdown of skin: Secondary | ICD-10-CM

## 2015-10-01 DIAGNOSIS — L89619 Pressure ulcer of right heel, unspecified stage: Secondary | ICD-10-CM

## 2015-10-01 DIAGNOSIS — E1142 Type 2 diabetes mellitus with diabetic polyneuropathy: Secondary | ICD-10-CM

## 2015-10-01 NOTE — Progress Notes (Signed)
Patient ID: [name], female   DOB: 1950/10/13, 65 y.o.   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 Previous ulcer size on the visit of 06/19/2015 was 3 mm  Objective: Orientated 3 Bleeding callus sub-fifth right MPJ that post debridement, 5 mm superficial ulcer with a granular base. No surrounding erythema edema or drainage Bleeding callus first MPJ right, first MPJ left, fifth MPJ left Nucleated keratoses left heel  Assessment: Noninfected dermal ulceration ulcer fifth MPJ right Pre-ulcerative calluses 3 Diabetic peripheral neuropathy Peripheral arterial disease  Plan: Debrided pre-ulcerative calluses  Debrided ulcer on fifth right MPJ and apply Silvadene cream Patient instructed apply Silvadene cream was stated the skin ulcer on the fifth right MPJ, cover with gauze and wear surgical shoe on the right foot   Reappoint 2 weeks

## 2015-10-01 NOTE — Patient Instructions (Signed)
Apply Silvadene cream once daily to the skin ulcer on the bottom the right foot and cover with gauze. Wear the surgical shoe on your right foot   Diabetes and Foot Care Diabetes may cause you to have problems because of poor blood supply (circulation) to your feet and legs. This may cause the skin on your feet to become thinner, break easier, and heal more slowly. Your skin may become dry, and the skin may peel and crack. You may also have nerve damage in your legs and feet causing decreased feeling in them. You may not notice minor injuries to your feet that could lead to infections or more serious problems. Taking care of your feet is one of the most important things you can do for yourself.  HOME CARE INSTRUCTIONS  Wear shoes at all times, even in the house. Do not go barefoot. Bare feet are easily injured.  Check your feet daily for blisters, cuts, and redness. If you cannot see the bottom of your feet, use a mirror or ask someone for help.  Wash your feet with warm water (do not use hot water) and mild soap. Then pat your feet and the areas between your toes until they are completely dry. Do not soak your feet as this can dry your skin.  Apply a moisturizing lotion or petroleum jelly (that does not contain alcohol and is unscented) to the skin on your feet and to dry, brittle toenails. Do not apply lotion between your toes.  Trim your toenails straight across. Do not dig under them or around the cuticle. File the edges of your nails with an emery board or nail file.  Do not cut corns or calluses or try to remove them with medicine.  Wear clean socks or stockings every day. Make sure they are not too tight. Do not wear knee-high stockings since they may decrease blood flow to your legs.  Wear shoes that fit properly and have enough cushioning. To break in new shoes, wear them for just a few hours a day. This prevents you from injuring your feet. Always look in your shoes before you put them  on to be sure there are no objects inside.  Do not cross your legs. This may decrease the blood flow to your feet.  If you find a minor scrape, cut, or break in the skin on your feet, keep it and the skin around it clean and dry. These areas may be cleansed with mild soap and water. Do not cleanse the area with peroxide, alcohol, or iodine.  When you remove an adhesive bandage, be sure not to damage the skin around it.  If you have a wound, look at it several times a day to make sure it is healing.  Do not use heating pads or hot water bottles. They may burn your skin. If you have lost feeling in your feet or legs, you may not know it is happening until it is too late.  Make sure your health care provider performs a complete foot exam at least annually or more often if you have foot problems. Report any cuts, sores, or bruises to your health care provider immediately. SEEK MEDICAL CARE IF:   You have an injury that is not healing.  You have cuts or breaks in the skin.  You have an ingrown nail.  You notice redness on your legs or feet.  You feel burning or tingling in your legs or feet.  You have pain or cramps  in your legs and feet.  Your legs or feet are numb.  Your feet always feel cold. SEEK IMMEDIATE MEDICAL CARE IF:   There is increasing redness, swelling, or pain in or around a wound.  There is a red line that goes up your leg.  Pus is coming from a wound.  You develop a fever or as directed by your health care provider.  You notice a bad smell coming from an ulcer or wound.   This information is not intended to replace advice given to you by your health care provider. Make sure you discuss any questions you have with your health care provider.   Document Released: 02/07/2000 Document Revised: 10/12/2012 Document Reviewed: 07/19/2012 Elsevier Interactive Patient Education [employer].

## 2015-10-02 ENCOUNTER — Telehealth: Payer: Self-pay | Admitting: Internal Medicine

## 2015-10-02 MED ORDER — NA SULFATE-K SULFATE-MG SULF 17.5-3.13-1.6 GM/177ML PO SOLN: 1.0000 | Freq: Once | ORAL | 0 refills | Status: AC

## 2015-10-02 NOTE — Telephone Encounter (Signed)
Sent Suprep to patient's pharmacy

## 2015-10-04 NOTE — Addendum Note (Signed)
Addended by: Truddie Crumble on: 10/04/2015 10:53 AM   Modules accepted: Orders

## 2015-10-15 ENCOUNTER — Telehealth: Payer: Self-pay | Admitting: Internal Medicine

## 2015-10-16 ENCOUNTER — Ambulatory Visit (INDEPENDENT_AMBULATORY_CARE_PROVIDER_SITE_OTHER): Payer: Medicaid Other | Admitting: Podiatry

## 2015-10-16 ENCOUNTER — Encounter: Payer: Self-pay | Admitting: Podiatry

## 2015-10-16 DIAGNOSIS — L97501 Non-pressure chronic ulcer of other part of unspecified foot limited to breakdown of skin: Secondary | ICD-10-CM

## 2015-10-16 DIAGNOSIS — I739 Peripheral vascular disease, unspecified: Secondary | ICD-10-CM

## 2015-10-16 DIAGNOSIS — L84 Corns and callosities: Secondary | ICD-10-CM | POA: Diagnosis not present

## 2015-10-16 DIAGNOSIS — E1142 Type 2 diabetes mellitus with diabetic polyneuropathy: Secondary | ICD-10-CM

## 2015-10-16 NOTE — Telephone Encounter (Signed)
Pt states she is having issues with steatorrhea again. Reports she even has an oily leakage from her rectum and has to wear a pad or tissue to absorb the oil. Pt wants to know what she can do for this. Please advise.

## 2015-10-16 NOTE — Patient Instructions (Signed)
Apply Silvadene cream once daily to the skin ulcer on the bottom of your right foot and cover with gauze Wear the surgical shoe on the right foot and limit amount of standing and walking  Diabetes and Foot Care Diabetes may cause you to have problems because of poor blood supply (circulation) to your feet and legs. This may cause the skin on your feet to become thinner, break easier, and heal more slowly. Your skin may become dry, and the skin may peel and crack. You may also have nerve damage in your legs and feet causing decreased feeling in them. You may not notice minor injuries to your feet that could lead to infections or more serious problems. Taking care of your feet is one of the most important things you can do for yourself.  HOME CARE INSTRUCTIONS  Wear shoes at all times, even in the house. Do not go barefoot. Bare feet are easily injured.  Check your feet daily for blisters, cuts, and redness. If you cannot see the bottom of your feet, use a mirror or ask someone for help.  Wash your feet with warm water (do not use hot water) and mild soap. Then pat your feet and the areas between your toes until they are completely dry. Do not soak your feet as this can dry your skin.  Apply a moisturizing lotion or petroleum jelly (that does not contain alcohol and is unscented) to the skin on your feet and to dry, brittle toenails. Do not apply lotion between your toes.  Trim your toenails straight across. Do not dig under them or around the cuticle. File the edges of your nails with an emery board or nail file.  Do not cut corns or calluses or try to remove them with medicine.  Wear clean socks or stockings every day. Make sure they are not too tight. Do not wear knee-high stockings since they may decrease blood flow to your legs.  Wear shoes that fit properly and have enough cushioning. To break in new shoes, wear them for just a few hours a day. This prevents you from injuring your feet.  Always look in your shoes before you put them on to be sure there are no objects inside.  Do not cross your legs. This may decrease the blood flow to your feet.  If you find a minor scrape, cut, or break in the skin on your feet, keep it and the skin around it clean and dry. These areas may be cleansed with mild soap and water. Do not cleanse the area with peroxide, alcohol, or iodine.  When you remove an adhesive bandage, be sure not to damage the skin around it.  If you have a wound, look at it several times a day to make sure it is healing.  Do not use heating pads or hot water bottles. They may burn your skin. If you have lost feeling in your feet or legs, you may not know it is happening until it is too late.  Make sure your health care provider performs a complete foot exam at least annually or more often if you have foot problems. Report any cuts, sores, or bruises to your health care provider immediately. SEEK MEDICAL CARE IF:   You have an injury that is not healing.  You have cuts or breaks in the skin.  You have an ingrown nail.  You notice redness on your legs or feet.  You feel burning or tingling in your legs or  feet.  You have pain or cramps in your legs and feet.  Your legs or feet are numb.  Your feet always feel cold. SEEK IMMEDIATE MEDICAL CARE IF:   There is increasing redness, swelling, or pain in or around a wound.  There is a red line that goes up your leg.  Pus is coming from a wound.  You develop a fever or as directed by your health care provider.  You notice a bad smell coming from an ulcer or wound.   This information is not intended to replace advice given to you by your health care provider. Make sure you discuss any questions you have with your health care provider.   Document Released: 02/07/2000 Document Revised: 10/12/2012 Document Reviewed: 07/19/2012 Elsevier Interactive Patient Education [employer].

## 2015-10-16 NOTE — Telephone Encounter (Signed)
Make sure that she is taking her pancreatic enzymes with meals and snacks. If so, increase the dose by 1 capsule until effective dosage reached

## 2015-10-16 NOTE — Telephone Encounter (Signed)
Attempted to call pt back but received message that stated to enter remote access code. Unable to leave message. Will try again.

## 2015-10-17 NOTE — Telephone Encounter (Signed)
Pt states she is taking the medication and will increase it by one capsule.

## 2015-10-17 NOTE — Progress Notes (Signed)
Patient ID: [name], female   DOB: 1950/07/27, 65 y.o.   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 Previous ulcer size on the visit of 06/19/2015 was 3 mm  Objective: Orientated 3 Bleeding callus sub-fifth right MPJ that post debridement, 5 mm superficial ulcer with a granular base. No surrounding erythema edema or drainage Bleeding callus first MPJ right, first MPJ left, fifth MPJ left Nucleated keratoses left heel  Assessment: Noninfected dermal ulceration ulcer fifth MPJ right Pre-ulcerative calluses 3 Diabetic peripheral neuropathy Peripheral arterial disease  Plan: Debrided pre-ulcerative calluses  Debrided ulcer on fifth right MPJ and apply Silvadene cream Patient instructed apply Silvadene cream was stated the skin ulcer on the fifth right MPJ, cover with gauze and wear surgical shoe on the right foot, additional Plastizote insole attach to surgical shoe   Reappoint 2 weeks

## 2015-10-23 ENCOUNTER — Encounter: Payer: Medicaid Other | Admitting: Internal Medicine

## 2015-10-24 ENCOUNTER — Encounter: Payer: Self-pay | Admitting: *Deleted

## 2015-10-24 ENCOUNTER — Other Ambulatory Visit: Payer: Self-pay | Admitting: *Deleted

## 2015-10-24 DIAGNOSIS — K5903 Drug induced constipation: Secondary | ICD-10-CM

## 2015-10-24 DIAGNOSIS — T402X5A Adverse effect of other opioids, initial encounter: Principal | ICD-10-CM

## 2015-10-24 MED ORDER — POLYETHYLENE GLYCOL 3350 17 G PO PACK: 17.0000 g | PACK | Freq: Every day | ORAL | 3 refills | Status: DC | PRN

## 2015-10-30 ENCOUNTER — Encounter: Payer: Self-pay | Admitting: Podiatry

## 2015-10-30 ENCOUNTER — Ambulatory Visit (INDEPENDENT_AMBULATORY_CARE_PROVIDER_SITE_OTHER): Payer: Medicaid Other | Admitting: Podiatry

## 2015-10-30 VITALS — HR 69 | Temp 97.7°F | Resp 18

## 2015-10-30 DIAGNOSIS — L97501 Non-pressure chronic ulcer of other part of unspecified foot limited to breakdown of skin: Secondary | ICD-10-CM | POA: Diagnosis not present

## 2015-10-30 NOTE — Patient Instructions (Signed)
Plies Silvadene cream daily and cover with gauze to the skin ulcer on the bottom of the right foot Wear the surgical shoe on the right foot Limits  standing and walking  Diabetes and Foot Care Diabetes may cause you to have problems because of poor blood supply (circulation) to your feet and legs. This may cause the skin on your feet to become thinner, break easier, and heal more slowly. Your skin may become dry, and the skin may peel and crack. You may also have nerve damage in your legs and feet causing decreased feeling in them. You may not notice minor injuries to your feet that could lead to infections or more serious problems. Taking care of your feet is one of the most important things you can do for yourself.  HOME CARE INSTRUCTIONS  Wear shoes at all times, even in the house. Do not go barefoot. Bare feet are easily injured.  Check your feet daily for blisters, cuts, and redness. If you cannot see the bottom of your feet, use a mirror or ask someone for help.  Wash your feet with warm water (do not use hot water) and mild soap. Then pat your feet and the areas between your toes until they are completely dry. Do not soak your feet as this can dry your skin.  Apply a moisturizing lotion or petroleum jelly (that does not contain alcohol and is unscented) to the skin on your feet and to dry, brittle toenails. Do not apply lotion between your toes.  Trim your toenails straight across. Do not dig under them or around the cuticle. File the edges of your nails with an emery board or nail file.  Do not cut corns or calluses or try to remove them with medicine.  Wear clean socks or stockings every day. Make sure they are not too tight. Do not wear knee-high stockings since they may decrease blood flow to your legs.  Wear shoes that fit properly and have enough cushioning. To break in new shoes, wear them for just a few hours a day. This prevents you from injuring your feet. Always look in your  shoes before you put them on to be sure there are no objects inside.  Do not cross your legs. This may decrease the blood flow to your feet.  If you find a minor scrape, cut, or break in the skin on your feet, keep it and the skin around it clean and dry. These areas may be cleansed with mild soap and water. Do not cleanse the area with peroxide, alcohol, or iodine.  When you remove an adhesive bandage, be sure not to damage the skin around it.  If you have a wound, look at it several times a day to make sure it is healing.  Do not use heating pads or hot water bottles. They may burn your skin. If you have lost feeling in your feet or legs, you may not know it is happening until it is too late.  Make sure your health care provider performs a complete foot exam at least annually or more often if you have foot problems. Report any cuts, sores, or bruises to your health care provider immediately. SEEK MEDICAL CARE IF:   You have an injury that is not healing.  You have cuts or breaks in the skin.  You have an ingrown nail.  You notice redness on your legs or feet.  You feel burning or tingling in your legs or feet.  You  have pain or cramps in your legs and feet.  Your legs or feet are numb.  Your feet always feel cold. SEEK IMMEDIATE MEDICAL CARE IF:   There is increasing redness, swelling, or pain in or around a wound.  There is a red line that goes up your leg.  Pus is coming from a wound.  You develop a fever or as directed by your health care provider.  You notice a bad smell coming from an ulcer or wound.   This information is not intended to replace advice given to you by your health care provider. Make sure you discuss any questions you have with your health care provider.   Document Released: 02/07/2000 Document Revised: 10/12/2012 Document Reviewed: 07/19/2012 Elsevier Interactive Patient Education [employer].

## 2015-10-31 NOTE — Progress Notes (Signed)
Patient ID: [name], female   DOB: [birth year], 65 y.o.   MRN: [id number]     Subjective: This patient presents for scheduled visit for maintenance and of pre-ulcerative ulcerative plantar keratoses on the right and left feet associated with diabetic peripheral neuropathy.   Objective: Orientated 3 Bleeding callus with superficial ulcer sub-fifth right MPJ that post debridement, 5 mm superficial ulcer with a granular base. No surrounding erythema edema or drainage Bleeding callus first MPJ right, first MPJ left, fifth MPJ left Nucleated keratoses left heel  Assessment: Noninfected dermal ulcerationulcer fifth MPJ right Pre-ulcerative calluses 3 Diabetic peripheral neuropathy Peripheral arterial disease  Plan: Debrided pre-ulcerative calluses  Debrided ulcer on fifth right MPJ and apply Silvadene cream Patient instructed apply Silvadene cream was stated the skin ulcer on the fifth right MPJ, cover with gauze and wear surgical shoe on the right foot, additional Plastizote insole attach to surgical shoe   Reappoint 2weeks

## 2015-11-11 ENCOUNTER — Encounter (HOSPITAL_COMMUNITY): Payer: Self-pay

## 2015-11-11 ENCOUNTER — Emergency Department (HOSPITAL_COMMUNITY)
Admission: EM | Admit: 2015-11-11 | Discharge: 2015-11-11 | Disposition: A | Discharge status: Home / Self Care | Payer: Medicaid Other | Attending: Emergency Medicine | Admitting: Emergency Medicine

## 2015-11-11 DIAGNOSIS — F17210 Nicotine dependence, cigarettes, uncomplicated: Secondary | ICD-10-CM | POA: Diagnosis not present

## 2015-11-11 DIAGNOSIS — I129 Hypertensive chronic kidney disease with stage 1 through stage 4 chronic kidney disease, or unspecified chronic kidney disease: Secondary | ICD-10-CM | POA: Diagnosis not present

## 2015-11-11 DIAGNOSIS — E1122 Type 2 diabetes mellitus with diabetic chronic kidney disease: Secondary | ICD-10-CM | POA: Insufficient documentation

## 2015-11-11 DIAGNOSIS — N183 Chronic kidney disease, stage 3 (moderate): Secondary | ICD-10-CM | POA: Insufficient documentation

## 2015-11-11 DIAGNOSIS — M62838 Other muscle spasm: Secondary | ICD-10-CM | POA: Diagnosis not present

## 2015-11-11 DIAGNOSIS — E11319 Type 2 diabetes mellitus with unspecified diabetic retinopathy without macular edema: Secondary | ICD-10-CM | POA: Insufficient documentation

## 2015-11-11 DIAGNOSIS — E1140 Type 2 diabetes mellitus with diabetic neuropathy, unspecified: Secondary | ICD-10-CM | POA: Diagnosis not present

## 2015-11-11 DIAGNOSIS — Z7984 Long term (current) use of oral hypoglycemic drugs: Secondary | ICD-10-CM | POA: Insufficient documentation

## 2015-11-11 DIAGNOSIS — M542 Cervicalgia: Secondary | ICD-10-CM | POA: Diagnosis present

## 2015-11-11 MED ORDER — CYCLOBENZAPRINE HCL 5 MG PO TABS: 5.0000 mg | ORAL_TABLET | Freq: Three times a day (TID) | ORAL | 0 refills | Status: DC | PRN

## 2015-11-11 MED ORDER — HYDROCODONE-ACETAMINOPHEN 5-325 MG PO TABS
1.0000 | ORAL_TABLET | Freq: Once | ORAL | Status: AC
  Administered 2015-11-11: 1 via ORAL
  Filled 2015-11-11: qty 1

## 2015-11-11 MED ORDER — CYCLOBENZAPRINE HCL 10 MG PO TABS
5.0000 mg | ORAL_TABLET | Freq: Once | ORAL | Status: AC
  Administered 2015-11-11: 5 mg via ORAL
  Filled 2015-11-11: qty 1

## 2015-11-11 NOTE — ED Notes (Signed)
Declined W/C at D/C and was escorted to lobby by RN. 

## 2015-11-11 NOTE — ED Triage Notes (Signed)
Pt states that she has had pain in the R and now L side of her neck that radiates down to her shoulder has been taking OTC tylenol with no relief

## 2015-11-11 NOTE — ED Provider Notes (Signed)
[name] DEPT Provider Note   CSN: [id number] Arrival date & time: 11/11/15  1337  By signing my name below, I, Irene Pap, attest that this documentation has been prepared under the direction and in the presence of Gay Filler, PA-C. Electronically Signed: Irene Pap, ED Scribe. 11/11/15. Marland Kitchen  History   Chief Complaint Chief Complaint  Patient presents with  . Neck Pain    pain in the L and R side of neck and shoulders pt denies injury    The history is provided by the patient. No language interpreter was used.   HPI Comments: [name] is a 65 y.o. Female with a hx of degenerative joint disease, arthritis, HTN, and DM who presents to the Emergency Department complaining of gradually worsening, "tightening" bilateral neck pain onset 4 days ago. She notes associated intermittent neck stiffness and warmth to the area. Pt reports radiating pain to the shoulders and upper back that worsens with movement of the head. She currently rates her pain 10/10. Pt has been using rubbing alcohol, topical analgesics, and Tylenol for her symptoms to no relief. Pt has a heating pad applied to her left neck in the ED. She denies recent injury or fall, hx of cancer, hx of IV drug use, fever, chills, trouble swallowing, vision changes, chest pain, SOB, bladder or bowel incontinence, nausea, vomiting, headache, facial droop, slurred speech, numbness, or weakness. Pt is not currently on steroids.   Past Medical History:  Diagnosis Date  . ANEMIA, CHRONIC DISEASE NEC 03/15/2006  . Constipation due to opioid therapy 06/05/2014  . DEGENERATIVE JOINT DISEASE 05/06/2007   On going for >5 yrs No imaging to confirm Has tried ultram, mobic, neurontin which did not work Ambulance person tried once worked well    . Diabetes mellitus   . DIABETIC  RETINOPATHY 03/15/2006  . 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 Diastolic dysfunction 80/22/3361   Grade 1 by Echocardiogram 12/13/14  . Hemorrhoids   .  Hyperlipidemia 04/01/2011  . Hypertension   . Moderate aortic regurgitation 09/25/2008   12/13/14 Echocardiogram    . PANCREATITIS, CHRONIC 03/29/2006  . Peripheral arterial disease ([place])   . RENAL INSUFFICIENCY, CHRONIC 03/15/2006    Patient Active Problem List   Diagnosis Date Noted  . Pancreatic insufficiency ([place]) 05/09/2015  . Angina pectoris ([place]) 04/20/2015  . 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 Risk for coronary artery disease greater than 20% in next 10 years 03/14/2015  . Diastolic dysfunction 22/44/9753  . Vulvar cyst 01/23/2015  . Abnormal uterine bleeding 12/02/2014  . Unexplained night sweats 12/02/2014  . Constipation due to opioid therapy 06/05/2014  . Loss of weight 03/29/2014  . Peripheral arterial disease ([place]) 12/12/2013  . GERD (gastroesophageal reflux disease) 10/12/2012  . Healthcare maintenance 09/29/2012  . Back pain 03/28/2012  . Hyperlipidemia 04/01/2011  . BOILS, RECURRENT 03/13/2010  . Moderate aortic regurgitation 09/25/2008  . HEMORRHOIDS 06/08/2008  . Osteoarthritis 05/06/2007  . TOBACCO ABUSE 09/27/2006  . Osteoporosis 06/29/2006  . PANCREATITIS, CHRONIC 03/29/2006  . Type 2 diabetes mellitus, controlled, with renal complications ([place]) 00/51/1021  . Diabetic neuropathy, painful ([place]) 03/15/2006  . Anemia of chronic disease 03/15/2006  . Essential hypertension 03/15/2006  . VENTRICULAR HYPERTROPHY, LEFT 03/15/2006  . CKD stage 3 due to type 2 diabetes mellitus ([place]) 03/15/2006    Past Surgical History:  Procedure Laterality Date  . BREAST SURGERY    . MOUTH SURGERY N/A     OB History    No data available  Home Medications    Prior to Admission medications   Medication Sig Start Date End Date Taking? Authorizing Provider  albuterol (PROVENTIL HFA) 108 (90 BASE) MCG/ACT inhaler Inhale 2 puffs into the lungs every 6 (six) hours as needed for wheezing. 12/25/14 12/25/15  Lucious Groves, DO  atorvastatin (LIPITOR) 40 MG  tablet Take 1 tablet (40 mg total) by mouth daily. Patient taking differently: Take 40 mg by mouth daily at 6 PM.  03/14/15 03/13/16  Lucious Groves, DO  Blood Glucose Monitoring Suppl (ACCU-CHEK AVIVA PLUS) w/Device KIT Use to check you blood sugar 3 times a day 06/04/15   Oval Linsey, MD  cyclobenzaprine (FLEXERIL) 5 MG tablet Take 1 tablet (5 mg total) by mouth 3 (three) times daily as needed for muscle spasms. 11/11/15   Roxanna Mew, PA-C  glucose blood (ACCU-CHEK AVIVA) test strip Use as instructed 05/09/15   Lucious Groves, DO  HYDROcodone-acetaminophen (NORCO) 5-325 MG tablet Take 1 tablet by mouth every 6 (six) hours as needed for moderate pain. 08/15/15   Lucious Groves, DO  ipratropium (ATROVENT HFA) 17 MCG/ACT inhaler inhale 1 to 2 puffs every 6 hours if needed Patient taking differently: Inhale 1-2 puffs into the lungs every 6 (six) hours as needed for wheezing. inhale 1 to 2 puffs every 6 hours if needed 12/27/14   Lucious Groves, DO  losartan (COZAAR) 100 MG tablet Take 1 tablet (100 mg total) by mouth daily. 08/21/15 08/20/16  Bartholomew Crews, MD  Pancrelipase, Lip-Prot-Amyl, 24000 units CPEP Take 3 tablets With meals and 2 tablets with snacks. No more than 10/24hours 05/09/15   Lucious Groves, DO  polyethylene glycol (MIRALAX / GLYCOLAX) packet Take 17 g by mouth daily as needed for mild constipation. 10/24/15   Oval Linsey, MD  pregabalin (LYRICA) 75 MG capsule Take 1 capsule (75 mg total) by mouth 2 (two) times daily. 05/09/15 05/08/16  Lucious Groves, DO  promethazine (PHENERGAN) 12.5 MG tablet Take 1 tablet (12.5 mg total) by mouth every 6 (six) hours as needed for nausea or vomiting. 05/09/15   Lucious Groves, DO  sitaGLIPtin (JANUVIA) 50 MG tablet Take 1 tablet (50 mg total) by mouth daily. 08/15/15   Lucious Groves, DO  travoprost, benzalkonium, (TRAVATAN) 0.004 % ophthalmic solution Place 1 drop into both eyes at bedtime.    Historical Provider, MD  trimethoprim-polymyxin b  (POLYTRIM) ophthalmic solution Place 1 drop into the right eye every 4 (four) hours. For seven days 08/29/15   Roxanna Mew, PA-C    Family History Family History  Problem Relation Age of Onset  . Dementia Mother     Social History Social History  Substance Use Topics  . Smoking status: Current Some Day Smoker    Packs/day: 0.50    Types: Cigarettes  . Smokeless tobacco: Never Used  . Alcohol use No     Allergies   Gabapentin; Januvia [sitagliptin]; and Nicorette [nicotine]   Review of Systems Review of Systems  Constitutional: Negative for chills and fever.  HENT: Negative for trouble swallowing.   Eyes: Negative for visual disturbance.  Respiratory: Negative for shortness of breath.   Cardiovascular: Negative for chest pain.  Gastrointestinal: Negative for nausea and vomiting.  Musculoskeletal: Positive for neck pain and neck stiffness.  Neurological: Negative for facial asymmetry, speech difficulty, weakness, numbness and headaches.   Physical Exam Updated Vital Signs BP 175/66 (BP Location: Left Arm)   Pulse 70   Temp 98  F (36.7 C) (Oral)   Resp 16   Ht 5' 5"  (1.651 m)   Wt 59 kg   SpO2 100%   BMI 21.63 kg/m   Physical Exam  Constitutional: She appears well-developed and well-nourished. No distress.  HENT:  Head: Normocephalic and atraumatic.  Eyes: Conjunctivae and EOM are normal. No scleral icterus.  Neck: Normal range of motion and phonation normal. Neck supple. Muscular tenderness present. No spinous process tenderness present. No neck rigidity. No edema and no erythema present.  No nuchal rigidity.   Cardiovascular: Normal rate.   Pulmonary/Chest: Effort normal. No respiratory distress.  Musculoskeletal: Normal range of motion.       Cervical back: She exhibits tenderness.  No midline spinal tenderness; no c-spine tenderness; left trapezius tenderness. Neck ROM intact. Patient able to ambulate.   Neurological: She is alert. She has normal  strength. She is not disoriented. No sensory deficit. Coordination and gait normal. GCS eye subscore is 4. GCS verbal subscore is 5. GCS motor subscore is 6.  Mental Status:  Alert, thought content appropriate, able to give a coherent history. Speech fluent without evidence of aphasia. Able to follow 2 step commands without difficulty.  Cranial Nerves:  II: pupils equal, round, reactive to light III,IV, VI: ptosis not present, extra-ocular motions intact bilaterally  V,VII: smile symmetric, facial light touch sensation equal VIII: hearing grossly normal to voice  IX, X: uvula elevates symmetrically  XI: bilateral shoulder shrug symmetric and strong XII: midline tongue extension without fassiculations  Skin: Skin is warm and dry. She is not diaphoretic.  Psychiatric: She has a normal mood and affect. Her behavior is normal.  Nursing note and vitals reviewed.    ED Treatments / Results  DIAGNOSTIC STUDIES: Oxygen Saturation is 98% on RA, normal by my interpretation.    COORDINATION OF CARE: 4:19 PM-Discussed treatment plan which includes muscle relaxants, heat, and ice with pt at bedside and pt agreed to plan.    Labs (all labs ordered are listed, but only abnormal results are displayed) Labs Reviewed - No data to display  EKG  EKG Interpretation None       Radiology No results found.  Procedures Procedures (including critical care time)  Medications Ordered in ED Medications  cyclobenzaprine (FLEXERIL) tablet 5 mg (5 mg Oral Given 11/11/15 1630)  HYDROcodone-acetaminophen (NORCO/VICODIN) 5-325 MG per tablet 1 tablet (1 tablet Oral Given 11/11/15 1630)     Initial Impression / Assessment and Plan / ED Course  I have reviewed the triage vital signs and the nursing notes.  Pertinent labs & imaging results that were available during my care of the patient were reviewed by me and considered in my medical decision making (see chart for details).  Clinical Course     Patient presents to ED b/l neck pain, worse on left. Patient is afebrile and non-toxic appearing in NAD. VSS. No C- spine tenderness or midline spinal tenderness. TTP of trapezius. Neck ROM intact. No focal neurologic deficits. Based on [place] C-spine, do not feel imaging is warranted at this time. Low suspicion for meningitis - afebrile, non-toxic appearing, no headache, nml neck ROM. At this time there does not appear to be any evidence of an acute emergency medical condition and the patient appears stable for discharge with appropriate outpatient follow up. Suspect muscle spasm. Pt will be given a dose of flexeril and vicodin in the ED and a prescription for flexeril. Diagnosis and plan was discussed with patient. Symptomatic management discussed. Follow up  with PCP if sxs persist. Return precautions given. Pt verbalizes understanding and is agreeable to discharge.   Final Clinical Impressions(s) / ED Diagnoses   Final diagnoses:  Trapezius muscle spasm    I personally performed the services described in this documentation, which was scribed in my presence. The recorded information has been reviewed and is accurate.    New Prescriptions Discharge Medication List as of 11/11/2015  4:30 PM    START taking these medications   Details  cyclobenzaprine (FLEXERIL) 5 MG tablet Take 1 tablet (5 mg total) by mouth 3 (three) times daily as needed for muscle spasms., Starting Mon 11/11/2015, Print         [place], PA-C 11/13/15 1505    Leo Grosser, MD 11/16/15 [phone]

## 2015-11-11 NOTE — Discharge Instructions (Signed)
Read the information below.  I suspect you have a muscle spasm in your neck. I have prescribed flexeril a muscle relaxer. Take as directed. This medication can make you drowsy, do not drive after taking. You can apply heat for 20 minute increments. You can try gentle massage. You can take tylenol for pain relief.   Use the prescribed medication as directed.  Please discuss all new medications with your pharmacist.   Be sure to follow up with your primary doctor if your symptoms do not improve in the next 3-5 days.  You may return to the Emergency Department at any time for worsening condition or any new symptoms that concern you. Return to ED if you develop fever, headache, neurologic symptoms such as numbness, weakness, lightheadedness, facial droop, slurred speech, chest pain, or shortness of breath.

## 2015-11-12 ENCOUNTER — Ambulatory Visit: Payer: Medicaid Other

## 2015-11-13 ENCOUNTER — Ambulatory Visit (INDEPENDENT_AMBULATORY_CARE_PROVIDER_SITE_OTHER): Payer: Medicaid Other | Admitting: Podiatry

## 2015-11-13 ENCOUNTER — Encounter: Payer: Self-pay | Admitting: Podiatry

## 2015-11-13 VITALS — BP 127/57 | HR 71 | Resp 18

## 2015-11-13 DIAGNOSIS — E1142 Type 2 diabetes mellitus with diabetic polyneuropathy: Secondary | ICD-10-CM | POA: Diagnosis not present

## 2015-11-13 DIAGNOSIS — L84 Corns and callosities: Secondary | ICD-10-CM

## 2015-11-13 NOTE — Progress Notes (Signed)
Patient ID: [name], female   DOB: 02-18-1951, 65 y.o.   MRN: [id number]   Subjective: This patient presents for ongoing maintenance for pre-ulcerative or ulcerative plantar skin lesions on right and left feet. Currently patient wearing surgical shoe with Plastizote insole and was last seen for a superficial ulcer plantar fifth right MPJ as well as ongoing debridement of pre-ulcerative calluses  Objective: Orientated 3 Bleeding callus first and fifth MPJ at all sites remain closed after debridement Nucleated keratoses left heel  Assessment: Diabetic with peripheral neuropathy and peripheral arterial disease Pre-ulcerative plantar calluses 4  Plan: Debride all pre-ulcerative plantar calluses 4 Maintain surgical shoe on right foot an additional week  Reappoint 3 weeks

## 2015-11-13 NOTE — Patient Instructions (Signed)
Wear the surgical shoe on the right foot  one more week  Diabetes and Foot Care Diabetes may cause you to have problems because of poor blood supply (circulation) to your feet and legs. This may cause the skin on your feet to become thinner, break easier, and heal more slowly. Your skin may become dry, and the skin may peel and crack. You may also have nerve damage in your legs and feet causing decreased feeling in them. You may not notice minor injuries to your feet that could lead to infections or more serious problems. Taking care of your feet is one of the most important things you can do for yourself.  HOME CARE INSTRUCTIONS  Wear shoes at all times, even in the house. Do not go barefoot. Bare feet are easily injured.  Check your feet daily for blisters, cuts, and redness. If you cannot see the bottom of your feet, use a mirror or ask someone for help.  Wash your feet with warm water (do not use hot water) and mild soap. Then pat your feet and the areas between your toes until they are completely dry. Do not soak your feet as this can dry your skin.  Apply a moisturizing lotion or petroleum jelly (that does not contain alcohol and is unscented) to the skin on your feet and to dry, brittle toenails. Do not apply lotion between your toes.  Trim your toenails straight across. Do not dig under them or around the cuticle. File the edges of your nails with an emery board or nail file.  Do not cut corns or calluses or try to remove them with medicine.  Wear clean socks or stockings every day. Make sure they are not too tight. Do not wear knee-high stockings since they may decrease blood flow to your legs.  Wear shoes that fit properly and have enough cushioning. To break in new shoes, wear them for just a few hours a day. This prevents you from injuring your feet. Always look in your shoes before you put them on to be sure there are no objects inside.  Do not cross your legs. This may decrease  the blood flow to your feet.  If you find a minor scrape, cut, or break in the skin on your feet, keep it and the skin around it clean and dry. These areas may be cleansed with mild soap and water. Do not cleanse the area with peroxide, alcohol, or iodine.  When you remove an adhesive bandage, be sure not to damage the skin around it.  If you have a wound, look at it several times a day to make sure it is healing.  Do not use heating pads or hot water bottles. They may burn your skin. If you have lost feeling in your feet or legs, you may not know it is happening until it is too late.  Make sure your health care provider performs a complete foot exam at least annually or more often if you have foot problems. Report any cuts, sores, or bruises to your health care provider immediately. SEEK MEDICAL CARE IF:   You have an injury that is not healing.  You have cuts or breaks in the skin.  You have an ingrown nail.  You notice redness on your legs or feet.  You feel burning or tingling in your legs or feet.  You have pain or cramps in your legs and feet.  Your legs or feet are numb.  Your feet always  feel cold. SEEK IMMEDIATE MEDICAL CARE IF:   There is increasing redness, swelling, or pain in or around a wound.  There is a red line that goes up your leg.  Pus is coming from a wound.  You develop a fever or as directed by your health care provider.  You notice a bad smell coming from an ulcer or wound.   This information is not intended to replace advice given to you by your health care provider. Make sure you discuss any questions you have with your health care provider.   Document Released: 02/07/2000 Document Revised: 10/12/2012 Document Reviewed: 07/19/2012 Elsevier Interactive Patient Education [employer].

## 2015-11-15 ENCOUNTER — Emergency Department (HOSPITAL_COMMUNITY)
Admission: EM | Admit: 2015-11-15 | Discharge: 2015-11-15 | Disposition: A | Discharge status: Home / Self Care | Payer: Medicaid Other | Attending: Emergency Medicine | Admitting: Emergency Medicine

## 2015-11-15 ENCOUNTER — Encounter (HOSPITAL_COMMUNITY): Payer: Self-pay | Admitting: Emergency Medicine

## 2015-11-15 ENCOUNTER — Emergency Department (HOSPITAL_COMMUNITY): Payer: Medicaid Other

## 2015-11-15 DIAGNOSIS — I129 Hypertensive chronic kidney disease with stage 1 through stage 4 chronic kidney disease, or unspecified chronic kidney disease: Secondary | ICD-10-CM | POA: Insufficient documentation

## 2015-11-15 DIAGNOSIS — M542 Cervicalgia: Secondary | ICD-10-CM | POA: Insufficient documentation

## 2015-11-15 DIAGNOSIS — Z79899 Other long term (current) drug therapy: Secondary | ICD-10-CM | POA: Diagnosis not present

## 2015-11-15 DIAGNOSIS — F17210 Nicotine dependence, cigarettes, uncomplicated: Secondary | ICD-10-CM | POA: Diagnosis not present

## 2015-11-15 DIAGNOSIS — N183 Chronic kidney disease, stage 3 (moderate): Secondary | ICD-10-CM | POA: Diagnosis not present

## 2015-11-15 DIAGNOSIS — E1140 Type 2 diabetes mellitus with diabetic neuropathy, unspecified: Secondary | ICD-10-CM | POA: Diagnosis not present

## 2015-11-15 DIAGNOSIS — E1122 Type 2 diabetes mellitus with diabetic chronic kidney disease: Secondary | ICD-10-CM | POA: Diagnosis not present

## 2015-11-15 MED ORDER — TRAMADOL HCL 50 MG PO TABS: 50.0000 mg | ORAL_TABLET | Freq: Four times a day (QID) | ORAL | 0 refills | Status: DC | PRN

## 2015-11-15 NOTE — Discharge Instructions (Signed)
Continue stretches, massage, your muscle relaxant, tylenol. Take tramadol in addition for pain relief. Follow up with your family doctor.

## 2015-11-15 NOTE — ED Notes (Signed)
Pt reports that she has an eye dr appointment at 2:30 and her transportation is picking her up at 1:15. Pt is asking to be out of here by 12.

## 2015-11-15 NOTE — ED Triage Notes (Signed)
Pt sts neck and pain in back of head and down back x 1 week; pt seen here for same on Monday

## 2015-11-15 NOTE — ED Provider Notes (Signed)
[facility] DEPT Provider Note   CSN: [id number] Arrival date & time: 11/15/15  1015     History   Chief Complaint Chief Complaint  Patient presents with  . Neck Pain  . Headache    HPI [name] is a 65 y.o. female.  HPI [name] is a 65 y.o. female history of diabetes, anemia, chronic pancreatitis, renal insufficiency, peripheral chill disease, presents to emergency department complaining of neck pain. Patient states neck pain started 5 days ago. She denies any injuries. She states pain is on both sides of the neck radiating to the both shoulders. States pain with movement of the neck especially looking up at the ceiling. She denies any pain radiating down her extremities. No numbness or weakness in her hands or arms. Denies history of similar pain in the past. She has seen her doctor and has been evaluated here 4 days ago for the same. She was prescribed Flexeril, and she is taking Tylenol, she states she also tried BenGay cream, massage, heating pads, all with no relief of pain. No pain over upper or lower back. No dizziness or lightheadedness. No visual changes. Affect patient does have an appointment with her ophthalmologist in few hours. Denies any fever or chills. No recent illnesses.  Past Medical History:  Diagnosis Date  . ANEMIA, CHRONIC DISEASE NEC 03/15/2006  . Constipation due to opioid therapy 06/05/2014  . DEGENERATIVE JOINT DISEASE 05/06/2007   On going for >5 yrs No imaging to confirm Has tried ultram, mobic, neurontin which did not work Ambulance person tried once worked well    . Diabetes mellitus   . DIABETIC  RETINOPATHY 03/15/2006  . 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 Diastolic dysfunction 31/51/7616   Grade 1 by Echocardiogram 12/13/14  . Hemorrhoids   . Hyperlipidemia 04/01/2011  . Hypertension   . Moderate aortic regurgitation 09/25/2008   12/13/14 Echocardiogram    . PANCREATITIS, CHRONIC 03/29/2006  . Peripheral arterial disease ([place])   . RENAL  INSUFFICIENCY, CHRONIC 03/15/2006    Patient Active Problem List   Diagnosis Date Noted  . Pancreatic insufficiency ([place]) 05/09/2015  . Angina pectoris ([place]) 04/20/2015  . 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 Risk for coronary artery disease greater than 20% in next 10 years 03/14/2015  . Diastolic dysfunction 07/37/1062  . Vulvar cyst 01/23/2015  . Abnormal uterine bleeding 12/02/2014  . Unexplained night sweats 12/02/2014  . Constipation due to opioid therapy 06/05/2014  . Loss of weight 03/29/2014  . Peripheral arterial disease ([place]) 12/12/2013  . GERD (gastroesophageal reflux disease) 10/12/2012  . Healthcare maintenance 09/29/2012  . Back pain 03/28/2012  . Hyperlipidemia 04/01/2011  . BOILS, RECURRENT 03/13/2010  . Moderate aortic regurgitation 09/25/2008  . HEMORRHOIDS 06/08/2008  . Osteoarthritis 05/06/2007  . TOBACCO ABUSE 09/27/2006  . Osteoporosis 06/29/2006  . PANCREATITIS, CHRONIC 03/29/2006  . Type 2 diabetes mellitus, controlled, with renal complications ([place]) 69/48/5462  . Diabetic neuropathy, painful ([place]) 03/15/2006  . Anemia of chronic disease 03/15/2006  . Essential hypertension 03/15/2006  . VENTRICULAR HYPERTROPHY, LEFT 03/15/2006  . CKD stage 3 due to type 2 diabetes mellitus ([place]) 03/15/2006    Past Surgical History:  Procedure Laterality Date  . BREAST SURGERY    . MOUTH SURGERY N/A     OB History    No data available       Home Medications    Prior to Admission medications   Medication Sig Start Date End Date Taking? Authorizing Provider  acetaminophen (  TYLENOL) 500 MG tablet Take 1,000 mg by mouth every 6 (six) hours as needed (pain).   Yes Historical Provider, MD  albuterol (PROVENTIL HFA) 108 (90 BASE) MCG/ACT inhaler Inhale 2 puffs into the lungs every 6 (six) hours as needed for wheezing. 12/25/14 12/25/15 Yes Lucious Groves, DO  atorvastatin (LIPITOR) 40 MG tablet Take 1 tablet (40 mg total) by mouth daily. 03/14/15 03/13/16  Yes Lucious Groves, DO  cyclobenzaprine (FLEXERIL) 5 MG tablet Take 1 tablet (5 mg total) by mouth 3 (three) times daily as needed for muscle spasms. 11/11/15  Yes Roxanna Mew, PA-C  HYDROcodone-acetaminophen (NORCO) 5-325 MG tablet Take 1 tablet by mouth every 6 (six) hours as needed for moderate pain. 08/15/15  Yes Lucious Groves, DO  losartan (COZAAR) 100 MG tablet Take 1 tablet (100 mg total) by mouth daily. 08/21/15 08/20/16 Yes Bartholomew Crews, MD  Multiple Vitamin (MULTIVITAMIN WITH MINERALS) TABS tablet Take 1 tablet by mouth daily.   Yes Historical Provider, MD  Pancrelipase, Lip-Prot-Amyl, 24000 units CPEP Take 3 tablets With meals and 2 tablets with snacks. No more than 10/24hours Patient taking differently: Take 96,000 Units by mouth See admin instructions. Take 4 capsules (96000 units) by mouth 2-3 times daily before meals (every time you eat) 05/09/15  Yes Lucious Groves, DO  polyethylene glycol (MIRALAX / GLYCOLAX) packet Take 17 g by mouth daily as needed for mild constipation. 10/24/15  Yes Oval Linsey, MD  pregabalin (LYRICA) 75 MG capsule Take 1 capsule (75 mg total) by mouth 2 (two) times daily. 05/09/15 05/08/16 Yes Lucious Groves, DO  promethazine (PHENERGAN) 12.5 MG tablet Take 1 tablet (12.5 mg total) by mouth every 6 (six) hours as needed for nausea or vomiting. 05/09/15  Yes Lucious Groves, DO  sitaGLIPtin (JANUVIA) 50 MG tablet Take 1 tablet (50 mg total) by mouth daily. Patient taking differently: Take 50 mg by mouth daily after breakfast.  08/15/15  Yes Lucious Groves, DO  travoprost, benzalkonium, (TRAVATAN) 0.004 % ophthalmic solution Place 1 drop into both eyes at bedtime.   Yes Historical Provider, MD  Blood Glucose Monitoring Suppl (ACCU-CHEK AVIVA PLUS) w/Device KIT Use to check you blood sugar 3 times a day 06/04/15   Oval Linsey, MD  glucose blood (ACCU-CHEK AVIVA) test strip Use as instructed 05/09/15   Lucious Groves, DO  ipratropium (ATROVENT HFA) 17  MCG/ACT inhaler inhale 1 to 2 puffs every 6 hours if needed Patient not taking: Reported on 11/15/2015 12/27/14   Lucious Groves, DO    Family History Family History  Problem Relation Age of Onset  . Dementia Mother     Social History Social History  Substance Use Topics  . Smoking status: Current Some Day Smoker    Packs/day: 0.50    Types: Cigarettes  . Smokeless tobacco: Never Used  . Alcohol use No     Allergies   Gabapentin and Nicorette [nicotine]   Review of Systems Review of Systems  Constitutional: Negative for chills and fever.  Eyes: Negative for pain and visual disturbance.  Respiratory: Negative for cough, chest tightness and shortness of breath.   Cardiovascular: Negative for chest pain, palpitations and leg swelling.  Gastrointestinal: Negative for abdominal pain, diarrhea, nausea and vomiting.  Genitourinary: Negative for dysuria, flank pain and pelvic pain.  Musculoskeletal: Positive for neck pain and neck stiffness. Negative for arthralgias and myalgias.  Skin: Negative for rash.  Neurological: Positive for headaches. Negative for dizziness and  weakness.  All other systems reviewed and are negative.    Physical Exam Updated Vital Signs BP 147/81 (BP Location: Right Arm)   Pulse 91   Temp 98.6 F (37 C) (Oral)   Resp 18   SpO2 99%   Physical Exam  Constitutional: She appears well-developed and well-nourished. No distress.  HENT:  Head: Normocephalic.  Eyes: Conjunctivae are normal.  Neck: Neck supple.  ttp over midline and bilateral perivertebral cervical muscles and spine. Full ROM of the neck except unable to look up. TTP extends into trapezius bilaterally  Cardiovascular: Normal rate, regular rhythm and normal heart sounds.   Pulmonary/Chest: Effort normal and breath sounds normal. No respiratory distress. She has no wheezes. She has no rales.  Abdominal: Soft. Bowel sounds are normal. She exhibits no distension. There is no tenderness.  There is no rebound.  Musculoskeletal: She exhibits no edema.  No midline thoracic or lumbar spine tenderness. Full ROM of bilateral upper extremities  Neurological: She is alert.  5/5 and equal bilateral deltoid, bicep, tricep, grip strength. Distal radial pulses intact and equal bilaterally  Skin: Skin is warm and dry.  Psychiatric: She has a normal mood and affect. Her behavior is normal.  Nursing note and vitals reviewed.    ED Treatments / Results  Labs (all labs ordered are listed, but only abnormal results are displayed) Labs Reviewed - No data to display  EKG  EKG Interpretation None       Radiology No results found.  Procedures Procedures (including critical care time)  Medications Ordered in ED Medications - No data to display   Initial Impression / Assessment and Plan / ED Course  I have reviewed the triage vital signs and the nursing notes.  Pertinent labs & imaging results that were available during my care of the patient were reviewed by me and considered in my medical decision making (see chart for details).  Clinical Course    Pt with bilateral neck pain. Seen for the same 4 days ago. No fever. No headache at this time. No numbness or weakness to extremities. Pain clearly reporduced with palpation of the perispinal and trapezius muscles. Xray showing degenrative changes. No dizziness, changes in vision, no concerning vascular symptoms. Pt tearful. States cant take this pain any more. Flexeril not helping. Will give 15 tabs of tramadol. Will need close outpatient follow up for this. Return precautions discussed.   Vitals:   11/15/15 1018  BP: 147/81  Pulse: 91  Resp: 18  Temp: 98.6 F (37 C)  TempSrc: Oral  SpO2: 99%     Final Clinical Impressions(s) / ED Diagnoses   Final diagnoses:  Neck pain    New Prescriptions New Prescriptions   TRAMADOL (ULTRAM) 50 MG TABLET    Take 1 tablet (50 mg total) by mouth every 6 (six) hours as needed.      Jeannett Senior, PA-C 11/15/15 [place], MD 11/16/15 [phone]

## 2015-11-20 ENCOUNTER — Encounter: Payer: Medicaid Other | Admitting: Internal Medicine

## 2015-11-20 ENCOUNTER — Telehealth: Payer: Self-pay | Admitting: Internal Medicine

## 2015-11-20 NOTE — Telephone Encounter (Signed)
Ask her what hospital. I don't see her in our system, only ER visit last week for neck pain. Thanks

## 2015-11-22 NOTE — Telephone Encounter (Signed)
Patient's Driver was admitted not the patient  She will cb when she finds someone else to bring her

## 2015-11-22 NOTE — Telephone Encounter (Signed)
Ok. Just wanted to make sure she was ok. Thanks

## 2015-11-24 ENCOUNTER — Other Ambulatory Visit: Payer: Self-pay | Admitting: Internal Medicine

## 2015-11-28 ENCOUNTER — Ambulatory Visit (INDEPENDENT_AMBULATORY_CARE_PROVIDER_SITE_OTHER): Payer: Medicaid Other | Admitting: Internal Medicine

## 2015-11-28 ENCOUNTER — Encounter: Payer: Self-pay | Admitting: Internal Medicine

## 2015-11-28 VITALS — BP 133/57 | HR 74 | Temp 97.6°F | Ht 65.0 in | Wt 134.5 lb

## 2015-11-28 DIAGNOSIS — Z79899 Other long term (current) drug therapy: Secondary | ICD-10-CM

## 2015-11-28 DIAGNOSIS — N183 Chronic kidney disease, stage 3 (moderate): Secondary | ICD-10-CM | POA: Diagnosis not present

## 2015-11-28 DIAGNOSIS — I739 Peripheral vascular disease, unspecified: Secondary | ICD-10-CM

## 2015-11-28 DIAGNOSIS — Z8739 Personal history of other diseases of the musculoskeletal system and connective tissue: Secondary | ICD-10-CM | POA: Diagnosis not present

## 2015-11-28 DIAGNOSIS — I10 Essential (primary) hypertension: Secondary | ICD-10-CM

## 2015-11-28 DIAGNOSIS — Z7984 Long term (current) use of oral hypoglycemic drugs: Secondary | ICD-10-CM | POA: Diagnosis not present

## 2015-11-28 DIAGNOSIS — M150 Primary generalized (osteo)arthritis: Secondary | ICD-10-CM | POA: Diagnosis not present

## 2015-11-28 DIAGNOSIS — F17210 Nicotine dependence, cigarettes, uncomplicated: Secondary | ICD-10-CM | POA: Diagnosis not present

## 2015-11-28 DIAGNOSIS — K8689 Other specified diseases of pancreas: Secondary | ICD-10-CM | POA: Diagnosis not present

## 2015-11-28 DIAGNOSIS — I6523 Occlusion and stenosis of bilateral carotid arteries: Secondary | ICD-10-CM

## 2015-11-28 DIAGNOSIS — E1122 Type 2 diabetes mellitus with diabetic chronic kidney disease: Secondary | ICD-10-CM

## 2015-11-28 DIAGNOSIS — M159 Polyosteoarthritis, unspecified: Secondary | ICD-10-CM

## 2015-11-28 DIAGNOSIS — I129 Hypertensive chronic kidney disease with stage 1 through stage 4 chronic kidney disease, or unspecified chronic kidney disease: Secondary | ICD-10-CM

## 2015-11-28 DIAGNOSIS — I779 Disorder of arteries and arterioles, unspecified: Secondary | ICD-10-CM | POA: Insufficient documentation

## 2015-11-28 LAB — POCT GLYCOSYLATED HEMOGLOBIN (HGB A1C): HEMOGLOBIN A1C: 8

## 2015-11-28 LAB — GLUCOSE, CAPILLARY: GLUCOSE-CAPILLARY: 109 mg/dL — AB (ref 65–99)

## 2015-11-28 MED ORDER — HYDROCODONE-ACETAMINOPHEN 5-325 MG PO TABS: 1.0000 | ORAL_TABLET | Freq: Four times a day (QID) | ORAL | 0 refills | Status: DC | PRN

## 2015-11-28 NOTE — Progress Notes (Signed)
[facility] Subjective:  HPI: [name] is a 65 y.o. female with a PMH detailed below who presents for follow up of DM.  Please see problem based charting below for the status of her chronic medical problems.  Review of Systems: Review of Systems  Constitutional: Positive for weight loss. Negative for fever and malaise/fatigue.  Respiratory: Negative for cough and shortness of breath.   Cardiovascular: Negative for chest pain.  Neurological: Negative for headaches.    Objective:  Physical Exam: Vitals:   11/28/15 0828  BP: (!) 133/57  Pulse: 74  Temp: 97.6 F (36.4 C)  TempSrc: Oral  SpO2: 100%  Weight: 134 lb 8 oz (61 kg)  Height: 5\' 5"  (1.651 m)  Physical Exam  Constitutional: She is well-developed, well-nourished, and in no distress. No distress.  HENT:  Head: Normocephalic and atraumatic.  Eyes: Conjunctivae are normal. No scleral icterus.  Neck:  No carotid bruits Cervical paraspinal musculature non tender, normal ROM of C-spine  Cardiovascular: Normal rate, regular rhythm, normal heart sounds and intact distal pulses.   No murmur heard. Pulmonary/Chest: Effort normal and breath sounds normal. No respiratory distress. She has no wheezes. She has no rales.  Abdominal: Soft. Bowel sounds are normal. She exhibits no distension. There is no tenderness.  Musculoskeletal: She exhibits no edema.  Neurological: No cranial nerve deficit.  Skin: Skin is warm and dry. She is not diaphoretic.  Psychiatric: Affect and judgment normal.  Nursing note and vitals reviewed.  Assessment & Plan:  Essential hypertension HPI: Tolerating medication no complaints A: Essential HTN  P: Continue losartan 100 mg daily   Bilateral carotid artery disease (HCC) History of present illness: She is recently seen twice in the emergency department, I have reviewed these notes it was felt that she had muscular strain due to degenerative disc disease in her  cervical spine and she was given muscle relaxants and pain medication. Her neck pain is now improved. However the x-ray of her cervical spine also showed carotid atherosclerosis. She denies any symptoms of visual loss, weakness, numbness. She is currently treated with atorvastatin 40 mg daily for her peripheral vascular disease.  Assessment: Bilateral carotid artery disease  Plan: Continue atorvastatin 40 mg daily Obtain carotid duplex to assess severity.  Pancreatic insufficiency History of present illness: She continues to endorse some weight loss although her weight is roughly stable. She saw her gastroenterologist [name] on 09/24/2015, I have reviewed his note, at that time she was having some fatty stools she also had lost her Creon for a short period of time and he felt like her weight loss was likely secondary to her pancreatic insufficiency and increased her Creon. She is due for repeat screening colonoscopy and he is arranging.  Assessment: Pancreatic insufficiency  Plan: Continue Creon   CKD stage 3 due to type 2 diabetes mellitus ([name]) History of present illness: She reports that she feels like her diabetes is unchanged she has been taking Januvia 50 mg daily she has been off Lantus for the last number of months. Her A1c returned today at 8%. At first she was surprised by this but then she later admitted increased consumption of cornbread and biscuits now on a daily basis. She denies any polyuria or polydipsia.  Assessment: Uncontrolled type 2 diabetes with CKD stage III  Plan:  Hgb A1c checked today Continue Januvia 50 mg daily We'll hold off on additional medication patient will cut out high carbohydrate foods and we'll repeat  A1c in 3 months.  History of osteoporosis History of present illness: She again asked me about her osteoporosis medication. I discussed with her that she did complete over 5 years of treatment with Fosamax and she had a repeat DEXA that showed great  improvement of her bone density and the recommendation would be not to continue Fosamax. I discussed that we previously decided that the only way we would restart Fosamax would be a if she obtained a new DEXA that showed worsening osteoporosis. That DEXA was ordered over a year ago but never completed by patient I offered to reorder this and she decided that she is satisfied with her previous treatment.  Assessment: History of osteoporosis   Plan: Treated previously with Fosamax.    Osteoarthritis History of present illness: She continues to have pains occasionally in her knees, neck, hips and hands. She reports that [place] 06/26/2023 has been working well.  She typically takes it twice a day occasionally is able to take it once a day and rarely takes it 3 times a day. Unfortunately she did receive a prescription for tramadol from the emergency department I told her that this was a violation of her pain medication agreement and the mixing different narcotics can be dangerous. She apologized and reported in the future she will refuse ED prescriptions of controlled medications.    Assessment: Chronic osteoarthritis multiple sites  Plan: Refill 3 months of Norco 5-3 25 I reviewed her [place] controlled medication database today. Of note her name on the database is [facility], hospital. The database appears to show that she is only receiving medications from our clinic except for the ED prescription for tramadol.    Medications Ordered Meds ordered this encounter  Medications  . DISCONTD: HYDROcodone-acetaminophen (NORCO) 5-325 MG tablet    Sig: Take 1 tablet by mouth every 6 (six) hours as needed for moderate pain.    Dispense:  60 tablet    Refill:  0    Rx 1/3 to be filled 30 days after previous script  . DISCONTD: HYDROcodone-acetaminophen (NORCO) 5-325 MG tablet    Sig: Take 1 tablet by mouth every 6 (six) hours as needed for moderate pain.    Dispense:  60 tablet    Refill:  0    Rx 2/3 to be  filled 30 days after previous script  . HYDROcodone-acetaminophen (NORCO) 5-325 MG tablet    Sig: Take 1 tablet by mouth every 6 (six) hours as needed for moderate pain.    Dispense:  60 tablet    Refill:  0    Rx 3/3 to be filled 30 days after previous script   Other Orders Orders Placed This Encounter  Procedures  . US Carotid Duplex Bilateral    Standing Status:   Future    Standing Expiration Date:   01/27/2017    Order Specific Question:   Reason for exam:    Answer:   bilateral carotid atherosclerosis    Order Specific Question:   Preferred imaging location?    Answer:   [facility]  . Glucose, capillary  . POC Hbg A1C   Follow Up: Return in about 3 months (around 02/28/2016).

## 2015-11-28 NOTE — Patient Instructions (Signed)
Work on decreasing cornbread and biscuits.  I want you to get a Carotid ultrasound to evaluate the blood vessels in your neck.

## 2015-11-29 NOTE — Assessment & Plan Note (Signed)
History of present illness: She is recently seen twice in the emergency department, I have reviewed these notes it was felt that she had muscular strain due to degenerative disc disease in her cervical spine and she was given muscle relaxants and pain medication. Her neck pain is now improved. However the x-ray of her cervical spine also showed carotid atherosclerosis. She denies any symptoms of visual loss, weakness, numbness. She is currently treated with atorvastatin 40 mg daily for her peripheral vascular disease.  Assessment: Bilateral carotid artery disease  Plan: Continue atorvastatin 40 mg daily Obtain carotid duplex to assess severity.

## 2015-11-29 NOTE — Assessment & Plan Note (Signed)
History of present illness: She continues to have pains occasionally in her knees, neck, hips and hands. She reports that [place] 06/26/2023 has been working well.  She typically takes it twice a day occasionally is able to take it once a day and rarely takes it 3 times a day. Unfortunately she did receive a prescription for tramadol from the emergency department I told her that this was a violation of her pain medication agreement and the mixing different narcotics can be dangerous. She apologized and reported in the future she will refuse ED prescriptions of controlled medications.    Assessment: Chronic osteoarthritis multiple sites  Plan: Refill 3 months of Norco 5-3 25 I reviewed her [place] controlled medication database today. Of note her name on the database is [facility] Pharmacist, hospital. The database appears to show that she is only receiving medications from our clinic except for the ED prescription for tramadol.

## 2015-11-29 NOTE — Assessment & Plan Note (Signed)
History of present illness: She continues to endorse some weight loss although her weight is roughly stable. She saw her gastroenterologist Dr. Henrene Pastor on 09/24/2015, I have reviewed his note, at that time she was having some fatty stools she also had lost her Creon for a short period of time and he felt like her weight loss was likely secondary to her pancreatic insufficiency and increased her Creon. She is due for repeat screening colonoscopy and he is arranging.  Assessment: Pancreatic insufficiency  Plan: Continue Creon

## 2015-11-29 NOTE — Assessment & Plan Note (Signed)
HPI: Tolerating medication no complaints A: Essential HTN  P: Continue losartan 100 mg daily

## 2015-11-29 NOTE — Assessment & Plan Note (Signed)
History of present illness: She again asked me about her osteoporosis medication. I discussed with her that she did complete over 5 years of treatment with Fosamax and she had a repeat DEXA that showed great improvement of her bone density and the recommendation would be not to continue Fosamax. I discussed that we previously decided that the only way we would restart Fosamax would be a if she obtained a new DEXA that showed worsening osteoporosis. That DEXA was ordered over a year ago but never completed by patient I offered to reorder this and she decided that she is satisfied with her previous treatment.  Assessment: History of osteoporosis   Plan: Treated previously with Fosamax.

## 2015-11-29 NOTE — Assessment & Plan Note (Addendum)
History of present illness: She reports that she feels like her diabetes is unchanged she has been taking Januvia 50 mg daily she has been off Lantus for the last number of months. Her A1c returned today at 8%. At first she was surprised by this but then she later admitted increased consumption of cornbread and biscuits now on a daily basis. She denies any polyuria or polydipsia.  Assessment: Uncontrolled type 2 diabetes with CKD stage III  Plan:  Hgb A1c checked today Continue Januvia 50 mg daily We'll hold off on additional medication patient will cut out high carbohydrate foods and we'll repeat A1c in 3 months.

## 2015-12-04 ENCOUNTER — Encounter: Payer: Self-pay | Admitting: Podiatry

## 2015-12-04 ENCOUNTER — Ambulatory Visit (INDEPENDENT_AMBULATORY_CARE_PROVIDER_SITE_OTHER): Payer: Medicaid Other | Admitting: Podiatry

## 2015-12-04 VITALS — BP 127/64 | HR 74 | Resp 18

## 2015-12-04 DIAGNOSIS — L84 Corns and callosities: Secondary | ICD-10-CM

## 2015-12-04 DIAGNOSIS — I739 Peripheral vascular disease, unspecified: Secondary | ICD-10-CM

## 2015-12-04 DIAGNOSIS — E1142 Type 2 diabetes mellitus with diabetic polyneuropathy: Secondary | ICD-10-CM | POA: Diagnosis not present

## 2015-12-04 DIAGNOSIS — Q828 Other specified congenital malformations of skin: Secondary | ICD-10-CM | POA: Diagnosis not present

## 2015-12-04 NOTE — Progress Notes (Signed)
Patient ID: [name], female   DOB: 1950/06/03, 65 y.o.   MRN: [id number]    Subjective: This patient presents for ongoing maintenance for pre-ulcerative or ulcerative plantar skin lesions on right and left feet. Currently patient wearing surgical shoe with Plastizote insole and was last seen for a superficial ulcer plantar fifth right MPJ as well as ongoing debridement of pre-ulcerative calluses  Objective: Orientated 3 Bleeding callus first and fifth MPJ at all sites remain closed after debridement Nucleated keratoses left heel  Assessment: Diabetic with peripheral neuropathy and peripheral arterial disease Pre-ulcerative plantar calluses 4  Plan: Debride all pre-ulcerative plantar calluses 4 without any bleeding  Reappoint 3 weeks

## 2015-12-04 NOTE — Patient Instructions (Signed)
Diabetes and Foot Care Diabetes may cause you to have problems because of poor blood supply (circulation) to your feet and legs. This may cause the skin on your feet to become thinner, break easier, and heal more slowly. Your skin may become dry, and the skin may peel and crack. You may also have nerve damage in your legs and feet causing decreased feeling in them. You may not notice minor injuries to your feet that could lead to infections or more serious problems. Taking care of your feet is one of the most important things you can do for yourself.  HOME CARE INSTRUCTIONS  Wear shoes at all times, even in the house. Do not go barefoot. Bare feet are easily injured.  Check your feet daily for blisters, cuts, and redness. If you cannot see the bottom of your feet, use a mirror or ask someone for help.  Wash your feet with warm water (do not use hot water) and mild soap. Then pat your feet and the areas between your toes until they are completely dry. Do not soak your feet as this can dry your skin.  Apply a moisturizing lotion or petroleum jelly (that does not contain alcohol and is unscented) to the skin on your feet and to dry, brittle toenails. Do not apply lotion between your toes.  Trim your toenails straight across. Do not dig under them or around the cuticle. File the edges of your nails with an emery board or nail file.  Do not cut corns or calluses or try to remove them with medicine.  Wear clean socks or stockings every day. Make sure they are not too tight. Do not wear knee-high stockings since they may decrease blood flow to your legs.  Wear shoes that fit properly and have enough cushioning. To break in new shoes, wear them for just a few hours a day. This prevents you from injuring your feet. Always look in your shoes before you put them on to be sure there are no objects inside.  Do not cross your legs. This may decrease the blood flow to your feet.  If you find a minor scrape,  cut, or break in the skin on your feet, keep it and the skin around it clean and dry. These areas may be cleansed with mild soap and water. Do not cleanse the area with peroxide, alcohol, or iodine.  When you remove an adhesive bandage, be sure not to damage the skin around it.  If you have a wound, look at it several times a day to make sure it is healing.  Do not use heating pads or hot water bottles. They may burn your skin. If you have lost feeling in your feet or legs, you may not know it is happening until it is too late.  Make sure your health care provider performs a complete foot exam at least annually or more often if you have foot problems. Report any cuts, sores, or bruises to your health care provider immediately. SEEK MEDICAL CARE IF:   You have an injury that is not healing.  You have cuts or breaks in the skin.  You have an ingrown nail.  You notice redness on your legs or feet.  You feel burning or tingling in your legs or feet.  You have pain or cramps in your legs and feet.  Your legs or feet are numb.  Your feet always feel cold. SEEK IMMEDIATE MEDICAL CARE IF:   There is increasing redness,   swelling, or pain in or around a wound.  There is a red line that goes up your leg.  Pus is coming from a wound.  You develop a fever or as directed by your health care provider.  You notice a bad smell coming from an ulcer or wound.   This information is not intended to replace advice given to you by your health care provider. Make sure you discuss any questions you have with your health care provider.   Document Released: 02/07/2000 Document Revised: 10/12/2012 Document Reviewed: 07/19/2012 Elsevier Interactive Patient Education 2016 [employer]  

## 2015-12-11 ENCOUNTER — Other Ambulatory Visit: Payer: Self-pay

## 2015-12-11 MED ORDER — PREGABALIN 75 MG PO CAPS: 75.0000 mg | ORAL_CAPSULE | Freq: Two times a day (BID) | ORAL | 5 refills | Status: DC

## 2015-12-11 NOTE — Telephone Encounter (Signed)
I will send message to [name] to check on PA.

## 2015-12-11 NOTE — Telephone Encounter (Signed)
[name], could you phone this into her pharmacy?

## 2015-12-11 NOTE — Telephone Encounter (Signed)
Lyrica rx called to [facility] pharmacy.

## 2015-12-11 NOTE — Telephone Encounter (Signed)
Requesting PA on hydrocodone, needs Lyrica to be filled.

## 2015-12-13 ENCOUNTER — Telehealth: Payer: Self-pay | Admitting: *Deleted

## 2015-12-13 NOTE — Telephone Encounter (Signed)
PA request submitted online via [place]  [id number]  [id number] [facility]  [id number] P Nella C Watts 12/13/2015  APROVED 12/13/2015 - 06/10/2016 DMA

## 2015-12-25 ENCOUNTER — Encounter: Payer: Self-pay | Admitting: Podiatry

## 2015-12-25 ENCOUNTER — Ambulatory Visit (INDEPENDENT_AMBULATORY_CARE_PROVIDER_SITE_OTHER): Payer: Medicaid Other | Admitting: Podiatry

## 2015-12-25 VITALS — BP 129/60 | HR 62 | Resp 18

## 2015-12-25 DIAGNOSIS — Q828 Other specified congenital malformations of skin: Secondary | ICD-10-CM

## 2015-12-25 DIAGNOSIS — I739 Peripheral vascular disease, unspecified: Secondary | ICD-10-CM | POA: Diagnosis not present

## 2015-12-25 DIAGNOSIS — L97501 Non-pressure chronic ulcer of other part of unspecified foot limited to breakdown of skin: Secondary | ICD-10-CM

## 2015-12-25 DIAGNOSIS — E1140 Type 2 diabetes mellitus with diabetic neuropathy, unspecified: Secondary | ICD-10-CM | POA: Diagnosis not present

## 2015-12-25 DIAGNOSIS — L84 Corns and callosities: Secondary | ICD-10-CM

## 2015-12-25 NOTE — Patient Instructions (Signed)
Silvadene cream to the skin ulcer on the bottom the right foot daily and cover with gauze Wear the surgical shoe on the right foot  Diabetes and Foot Care Diabetes may cause you to have problems because of poor blood supply (circulation) to your feet and legs. This may cause the skin on your feet to become thinner, break easier, and heal more slowly. Your skin may become dry, and the skin may peel and crack. You may also have nerve damage in your legs and feet causing decreased feeling in them. You may not notice minor injuries to your feet that could lead to infections or more serious problems. Taking care of your feet is one of the most important things you can do for yourself.  HOME CARE INSTRUCTIONS  Wear shoes at all times, even in the house. Do not go barefoot. Bare feet are easily injured.  Check your feet daily for blisters, cuts, and redness. If you cannot see the bottom of your feet, use a mirror or ask someone for help.  Wash your feet with warm water (do not use hot water) and mild soap. Then pat your feet and the areas between your toes until they are completely dry. Do not soak your feet as this can dry your skin.  Apply a moisturizing lotion or petroleum jelly (that does not contain alcohol and is unscented) to the skin on your feet and to dry, brittle toenails. Do not apply lotion between your toes.  Trim your toenails straight across. Do not dig under them or around the cuticle. File the edges of your nails with an emery board or nail file.  Do not cut corns or calluses or try to remove them with medicine.  Wear clean socks or stockings every day. Make sure they are not too tight. Do not wear knee-high stockings since they may decrease blood flow to your legs.  Wear shoes that fit properly and have enough cushioning. To break in new shoes, wear them for just a few hours a day. This prevents you from injuring your feet. Always look in your shoes before you put them on to be sure  there are no objects inside.  Do not cross your legs. This may decrease the blood flow to your feet.  If you find a minor scrape, cut, or break in the skin on your feet, keep it and the skin around it clean and dry. These areas may be cleansed with mild soap and water. Do not cleanse the area with peroxide, alcohol, or iodine.  When you remove an adhesive bandage, be sure not to damage the skin around it.  If you have a wound, look at it several times a day to make sure it is healing.  Do not use heating pads or hot water bottles. They may burn your skin. If you have lost feeling in your feet or legs, you may not know it is happening until it is too late.  Make sure your health care provider performs a complete foot exam at least annually or more often if you have foot problems. Report any cuts, sores, or bruises to your health care provider immediately. SEEK MEDICAL CARE IF:   You have an injury that is not healing.  You have cuts or breaks in the skin.  You have an ingrown nail.  You notice redness on your legs or feet.  You feel burning or tingling in your legs or feet.  You have pain or cramps in your legs  and feet.  Your legs or feet are numb.  Your feet always feel cold. SEEK IMMEDIATE MEDICAL CARE IF:   There is increasing redness, swelling, or pain in or around a wound.  There is a red line that goes up your leg.  Pus is coming from a wound.  You develop a fever or as directed by your health care provider.  You notice a bad smell coming from an ulcer or wound.   This information is not intended to replace advice given to you by your health care provider. Make sure you discuss any questions you have with your health care provider.   Document Released: 02/07/2000 Document Revised: 10/12/2012 Document Reviewed: 07/19/2012 Elsevier Interactive Patient Education [employer].

## 2015-12-25 NOTE — Progress Notes (Signed)
Patient ID: [name], female   DOB: 1950/05/19, 65 y.o.   MRN: [id number]     Subjective: This patient presents for ongoing maintenance for pre-ulcerative or ulcerative plantar skin lesions on right and left feet. Currently patient wearing surgical shoe with Plastizote insole and was last seen for a superficial ulcer plantar fifth right MPJ as well as ongoing debridement of pre-ulcerative calluses  Objective: Orientated 3 DP pulses 2/4 bilaterally PT pulses trace palpable bilaterally Capillary reflex immediate bilaterally Sensation to 10 g monofilament wire intact 5/5 bilaterally Vibratory sensation reactive bilaterally Ankle reflex equal and reactive bilaterally 5 mm superficial ulcer with granular base fifth right MPJ Pre-ulcerative calluses first MPJ bilaterally and fifth MPJ left Nucleated keratoses left heel  Assessment: 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

## 2015-12-30 ENCOUNTER — Telehealth: Payer: Self-pay | Admitting: *Deleted

## 2015-12-30 NOTE — Telephone Encounter (Signed)
Pt states the right foot she is putting the cream on is very painful in her shoe. I told pt to continue the wound care and take the OTC Tylenol regular strength as directed if tolerated. Pt states understanding and I transferred to schedulers to get an appt for tomorrow.

## 2016-01-01 ENCOUNTER — Ambulatory Visit: Payer: Medicaid Other | Admitting: Podiatry

## 2016-01-07 LAB — HM DIABETES EYE EXAM

## 2016-01-08 ENCOUNTER — Ambulatory Visit (INDEPENDENT_AMBULATORY_CARE_PROVIDER_SITE_OTHER): Payer: Medicaid Other | Admitting: Podiatry

## 2016-01-08 ENCOUNTER — Encounter: Payer: Self-pay | Admitting: Podiatry

## 2016-01-08 VITALS — BP 123/59 | HR 58 | Resp 18

## 2016-01-08 DIAGNOSIS — L97501 Non-pressure chronic ulcer of other part of unspecified foot limited to breakdown of skin: Secondary | ICD-10-CM

## 2016-01-08 DIAGNOSIS — B351 Tinea unguium: Secondary | ICD-10-CM

## 2016-01-08 DIAGNOSIS — I739 Peripheral vascular disease, unspecified: Secondary | ICD-10-CM

## 2016-01-08 DIAGNOSIS — M79676 Pain in unspecified toe(s): Secondary | ICD-10-CM | POA: Diagnosis not present

## 2016-01-08 NOTE — Patient Instructions (Signed)
Apply Silvadene cream to the skin ulcer on the bottom of the right foot daily until a scab forms, cover with Band-Aid or gauze   Diabetes and Foot Care Diabetes may cause you to have problems because of poor blood supply (circulation) to your feet and legs. This may cause the skin on your feet to become thinner, break easier, and heal more slowly. Your skin may become dry, and the skin may peel and crack. You may also have nerve damage in your legs and feet causing decreased feeling in them. You may not notice minor injuries to your feet that could lead to infections or more serious problems. Taking care of your feet is one of the most important things you can do for yourself. Follow these instructions at home:  Wear shoes at all times, even in the house. Do not go barefoot. Bare feet are easily injured.  Check your feet daily for blisters, cuts, and redness. If you cannot see the bottom of your feet, use a mirror or ask someone for help.  Wash your feet with warm water (do not use hot water) and mild soap. Then pat your feet and the areas between your toes until they are completely dry. Do not soak your feet as this can dry your skin.  Apply a moisturizing lotion or petroleum jelly (that does not contain alcohol and is unscented) to the skin on your feet and to dry, brittle toenails. Do not apply lotion between your toes.  Trim your toenails straight across. Do not dig under them or around the cuticle. File the edges of your nails with an emery board or nail file.  Do not cut corns or calluses or try to remove them with medicine.  Wear clean socks or stockings every day. Make sure they are not too tight. Do not wear knee-high stockings since they may decrease blood flow to your legs.  Wear shoes that fit properly and have enough cushioning. To break in new shoes, wear them for just a few hours a day. This prevents you from injuring your feet. Always look in your shoes before you put them on  to be sure there are no objects inside.  Do not cross your legs. This may decrease the blood flow to your feet.  If you find a minor scrape, cut, or break in the skin on your feet, keep it and the skin around it clean and dry. These areas may be cleansed with mild soap and water. Do not cleanse the area with peroxide, alcohol, or iodine.  When you remove an adhesive bandage, be sure not to damage the skin around it.  If you have a wound, look at it several times a day to make sure it is healing.  Do not use heating pads or hot water bottles. They may burn your skin. If you have lost feeling in your feet or legs, you may not know it is happening until it is too late.  Make sure your health care provider performs a complete foot exam at least annually or more often if you have foot problems. Report any cuts, sores, or bruises to your health care provider immediately. Contact a health care provider if:  You have an injury that is not healing.  You have cuts or breaks in the skin.  You have an ingrown nail.  You notice redness on your legs or feet.  You feel burning or tingling in your legs or feet.  You have pain or cramps in  your legs and feet.  Your legs or feet are numb.  Your feet always feel cold. Get help right away if:  There is increasing redness, swelling, or pain in or around a wound.  There is a red line that goes up your leg.  Pus is coming from a wound.  You develop a fever or as directed by your health care provider.  You notice a bad smell coming from an ulcer or wound. This information is not intended to replace advice given to you by your health care provider. Make sure you discuss any questions you have with your health care provider. Document Released: 02/07/2000 Document Revised: 07/18/2015 Document Reviewed: 07/19/2012 Elsevier Interactive Patient Education  2017 [employer].

## 2016-01-08 NOTE — Progress Notes (Signed)
Patient ID: [name], female   DOB: 1950/04/22, 65 y.o.   MRN: [id number]    Subjective: This patient presents for ongoing maintenance for pre-ulcerative or ulcerative plantar skin lesions on right and left feet. Currently patient wearing surgical shoe with Plastizote insole and was last seen for a superficial ulcer plantar fifth right MPJ as well as ongoing debridement of pre-ulcerative calluses. Patient also complaining of uncomfortable toenails today and requests nail debridement  Objective: Orientated 3 DP pulses 2/4 bilaterally PT pulses trace palpable bilaterally Capillary reflex immediate bilaterally Sensation to 10 g monofilament wire intact 5/5 bilaterally Vibratory sensation reactive bilaterally Ankle reflex equal and reactive bilaterally 5 mm superficial ulcer with granular base fifth right MPJ Pre-ulcerative calluses first MPJ bilaterally and fifth MPJ left Nucleated keratoses left heel Hypertrophic, deformed, discolored toenails 6-10  Assessment: Diabetic with peripheral neuropathy and peripheral arterial disease Pre-ulcerative plantar calluses 3 Superficial noninfected ulcer fifth MPJ right Symptomatic mycotic toenails 6-10   Plan: Debride plantar ulcer fifth MPJ right and apply Silvadene dressing Instructed patient apply Silvadene dressing and gauze to the fifth MPJ ulcer right and wear surgical shoe Debride all pre-ulcerative plantar calluses 3without any bleeding Debrided toenails 6-10 mechanically an electrical without any bleeding  Reappoint 3 weeks

## 2016-01-09 ENCOUNTER — Encounter: Payer: Self-pay | Admitting: Internal Medicine

## 2016-01-09 ENCOUNTER — Ambulatory Visit: Payer: Medicaid Other

## 2016-01-09 ENCOUNTER — Encounter: Payer: Self-pay | Admitting: *Deleted

## 2016-01-23 ENCOUNTER — Other Ambulatory Visit: Payer: Self-pay | Admitting: Internal Medicine

## 2016-01-23 DIAGNOSIS — I10 Essential (primary) hypertension: Secondary | ICD-10-CM

## 2016-01-24 NOTE — Telephone Encounter (Signed)
appt 02/27/2016 w/pcp

## 2016-01-29 ENCOUNTER — Ambulatory Visit: Payer: Medicaid Other | Admitting: Podiatry

## 2016-02-25 ENCOUNTER — Encounter: Payer: Self-pay | Admitting: Podiatry

## 2016-02-25 ENCOUNTER — Ambulatory Visit (INDEPENDENT_AMBULATORY_CARE_PROVIDER_SITE_OTHER): Payer: Medicaid Other | Admitting: Podiatry

## 2016-02-25 VITALS — BP 128/65 | HR 70 | Resp 14

## 2016-02-25 DIAGNOSIS — L84 Corns and callosities: Secondary | ICD-10-CM

## 2016-02-25 DIAGNOSIS — I739 Peripheral vascular disease, unspecified: Secondary | ICD-10-CM

## 2016-02-25 DIAGNOSIS — B351 Tinea unguium: Secondary | ICD-10-CM

## 2016-02-25 DIAGNOSIS — L97501 Non-pressure chronic ulcer of other part of unspecified foot limited to breakdown of skin: Secondary | ICD-10-CM | POA: Diagnosis not present

## 2016-02-25 DIAGNOSIS — M79676 Pain in unspecified toe(s): Secondary | ICD-10-CM

## 2016-02-25 MED ORDER — SILVER SULFADIAZINE 1 % EX CREA: 1.0000 "application " | TOPICAL_CREAM | Freq: Every day | CUTANEOUS | 0 refills | Status: DC

## 2016-02-25 NOTE — Patient Instructions (Addendum)
Apply Silvadene cream daily to the skin ulcer on the right foot and cover with gauze Wear the surgical shoe on the right foot If you notice any sudden increase in pain, swelling, redness, fever present to the emergency room  Diabetes and Foot Care Diabetes may cause you to have problems because of poor blood supply (circulation) to your feet and legs. This may cause the skin on your feet to become thinner, break easier, and heal more slowly. Your skin may become dry, and the skin may peel and crack. You may also have nerve damage in your legs and feet causing decreased feeling in them. You may not notice minor injuries to your feet that could lead to infections or more serious problems. Taking care of your feet is one of the most important things you can do for yourself. Follow these instructions at home:  Wear shoes at all times, even in the house. Do not go barefoot. Bare feet are easily injured.  Check your feet daily for blisters, cuts, and redness. If you cannot see the bottom of your feet, use a mirror or ask someone for help.  Wash your feet with warm water (do not use hot water) and mild soap. Then pat your feet and the areas between your toes until they are completely dry. Do not soak your feet as this can dry your skin.  Apply a moisturizing lotion or petroleum jelly (that does not contain alcohol and is unscented) to the skin on your feet and to dry, brittle toenails. Do not apply lotion between your toes.  Trim your toenails straight across. Do not dig under them or around the cuticle. File the edges of your nails with an emery board or nail file.  Do not cut corns or calluses or try to remove them with medicine.  Wear clean socks or stockings every day. Make sure they are not too tight. Do not wear knee-high stockings since they may decrease blood flow to your legs.  Wear shoes that fit properly and have enough cushioning. To break in new shoes, wear them for just a few hours a  day. This prevents you from injuring your feet. Always look in your shoes before you put them on to be sure there are no objects inside.  Do not cross your legs. This may decrease the blood flow to your feet.  If you find a minor scrape, cut, or break in the skin on your feet, keep it and the skin around it clean and dry. These areas may be cleansed with mild soap and water. Do not cleanse the area with peroxide, alcohol, or iodine.  When you remove an adhesive bandage, be sure not to damage the skin around it.  If you have a wound, look at it several times a day to make sure it is healing.  Do not use heating pads or hot water bottles. They may burn your skin. If you have lost feeling in your feet or legs, you may not know it is happening until it is too late.  Make sure your health care provider performs a complete foot exam at least annually or more often if you have foot problems. Report any cuts, sores, or bruises to your health care provider immediately. Contact a health care provider if:  You have an injury that is not healing.  You have cuts or breaks in the skin.  You have an ingrown nail.  You notice redness on your legs or feet.  You feel  burning or tingling in your legs or feet.  You have pain or cramps in your legs and feet.  Your legs or feet are numb.  Your feet always feel cold. Get help right away if:  There is increasing redness, swelling, or pain in or around a wound.  There is a red line that goes up your leg.  Pus is coming from a wound.  You develop a fever or as directed by your health care provider.  You notice a bad smell coming from an ulcer or wound. This information is not intended to replace advice given to you by your health care provider. Make sure you discuss any questions you have with your health care provider. Document Released: 02/07/2000 Document Revised: 07/18/2015 Document Reviewed: 07/19/2012 Elsevier Interactive Patient Education   2017 [employer].

## 2016-02-25 NOTE — Progress Notes (Signed)
Patient ID: [name], female   DOB: [birth year], 66 y.o.   MRN: [id number]    Subjective: This patient presents for ongoing maintenance for pre-ulcerative or ulcerative plantar skin lesions on right and left feet. Currently patient wearing surgical shoe with Plastizote insole and was last seen for a superficial ulcer plantar fifth right MPJ as well as ongoing debridement of pre-ulcerative calluses. Patient also complaining of uncomfortable toenails today and requests nail debridement  Objective: Orientated 3 DP pulses 2/4 bilaterally PT pulses trace palpable bilaterally Capillary reflex immediate bilaterally Sensation to 10 g monofilament wire intact 5/5 bilaterally Vibratory sensation reactive bilaterally Ankle reflex equal and reactive bilaterally 5 mm superficial ulcer with granular base fifth right MPJ Pre-ulcerative calluses first MPJ bilaterally and fifth MPJ left Nucleated keratoses left heel Hypertrophic, deformed, discolored toenails 6-10  Assessment: Diabetic with peripheral neuropathy and peripheral arterial disease Pre-ulcerative plantar calluses 3 Superficial noninfected ulcer fifth MPJ right Symptomatic mycotic toenails 6-10   Plan: Debride plantar ulcer fifth MPJ right and apply Silvadene dressing Instructed patient apply Silvadene dressing and gauze to the fifth MPJ ulcer right and wear surgical shoe Debride all pre-ulcerative plantar calluses 3without any bleeding Debrided toenails 6-10 mechanically an electricaly without any bleeding  Reappoint 2 weeks

## 2016-02-26 ENCOUNTER — Telehealth: Payer: Self-pay | Admitting: Internal Medicine

## 2016-02-26 NOTE — Telephone Encounter (Signed)
APT. REMINDER CALL, NO ANSWER, NO VOICEMAIL °

## 2016-02-27 ENCOUNTER — Ambulatory Visit: Payer: Medicaid Other | Admitting: Internal Medicine

## 2016-02-27 ENCOUNTER — Encounter: Payer: Self-pay | Admitting: Internal Medicine

## 2016-03-06 ENCOUNTER — Telehealth: Payer: Self-pay

## 2016-03-06 NOTE — Telephone Encounter (Signed)
sitaGLIPtin (JANUVIA) 50 MG table, and creon to be filled @ [facility] on [address].

## 2016-03-10 ENCOUNTER — Ambulatory Visit (INDEPENDENT_AMBULATORY_CARE_PROVIDER_SITE_OTHER): Payer: Medicaid Other | Admitting: Podiatry

## 2016-03-10 ENCOUNTER — Encounter: Payer: Self-pay | Admitting: Podiatry

## 2016-03-10 ENCOUNTER — Other Ambulatory Visit: Payer: Self-pay | Admitting: *Deleted

## 2016-03-10 DIAGNOSIS — L97501 Non-pressure chronic ulcer of other part of unspecified foot limited to breakdown of skin: Secondary | ICD-10-CM

## 2016-03-10 MED ORDER — SITAGLIPTIN PHOSPHATE 50 MG PO TABS: 50.0000 mg | ORAL_TABLET | Freq: Every day | ORAL | 1 refills | Status: DC

## 2016-03-10 MED ORDER — PANCRELIPASE (LIP-PROT-AMYL) 24000-76000 UNITS PO CPEP: 96000.0000 [IU] | ORAL_CAPSULE | ORAL | 3 refills | Status: DC

## 2016-03-10 MED ORDER — PANCRELIPASE (LIP-PROT-AMYL) 24000-76000 UNITS PO CPEP: 96000.0000 [IU] | ORAL_CAPSULE | ORAL | 3 refills | Status: AC

## 2016-03-10 NOTE — Progress Notes (Signed)
Patient ID: [name], female   DOB: 1950-03-28, 66 y.o.   MRN: [id number]    Subjective: This patient presents for ongoing maintenance for pre-ulcerative or ulcerative plantar skin lesions on right and left feet. Currently patient wearing surgical shoe with Plastizote insole and was last seen for a superficial ulcer plantar fifth right MPJ as well as ongoing debridement of pre-ulcerative calluses. Objective: Orientated 3 DP pulses 2/4 bilaterally PT pulses trace palpable bilaterally Capillary reflex immediate bilaterally Sensation to 10 g monofilament wire intact 5/5 bilaterally Vibratory sensation reactive bilaterally Ankle reflex equal and reactive bilaterally 5 mm superficial ulcer with granular base fifth right MPJ Pre-ulcerative calluses first MPJ bilaterally and fifth MPJ left Nucleated keratoses left heel Hypertrophic, deformed, discolored toenails 6-10 (last debrided 02/25/2016)  Assessment: Diabetic with peripheral neuropathy and peripheral arterial disease Pre-ulcerative plantar calluses 3 Superficial noninfected ulcer fifth MPJ right Symptomatic mycotic toenails 6-10   Plan: Debride plantar ulcer fifth MPJ right and apply Silvadene dressing Instructed patient apply Silvadene dressing and gauze to the fifth MPJ ulcer right and wear surgical shoe Debride all pre-ulcerative plantar calluses 3without any bleeding   Reappoint 2 weeks

## 2016-03-10 NOTE — Addendum Note (Signed)
Addended by: Joni Reining C on: 03/10/2016 12:56 PM   Modules accepted: Orders

## 2016-03-10 NOTE — Telephone Encounter (Signed)
Sent a refill request for creon

## 2016-03-10 NOTE — Patient Instructions (Signed)
Apply Silvadene cream to skin ulcer on the bottom the right foot daily and cover with gauze Wear the surgical shoe on the right foot If you notice any sudden increase in pain, swelling, redness, fever present to the emergency department  Diabetes and Foot Care Diabetes may cause you to have problems because of poor blood supply (circulation) to your feet and legs. This may cause the skin on your feet to become thinner, break easier, and heal more slowly. Your skin may become dry, and the skin may peel and crack. You may also have nerve damage in your legs and feet causing decreased feeling in them. You may not notice minor injuries to your feet that could lead to infections or more serious problems. Taking care of your feet is one of the most important things you can do for yourself. Follow these instructions at home:  Wear shoes at all times, even in the house. Do not go barefoot. Bare feet are easily injured.  Check your feet daily for blisters, cuts, and redness. If you cannot see the bottom of your feet, use a mirror or ask someone for help.  Wash your feet with warm water (do not use hot water) and mild soap. Then pat your feet and the areas between your toes until they are completely dry. Do not soak your feet as this can dry your skin.  Apply a moisturizing lotion or petroleum jelly (that does not contain alcohol and is unscented) to the skin on your feet and to dry, brittle toenails. Do not apply lotion between your toes.  Trim your toenails straight across. Do not dig under them or around the cuticle. File the edges of your nails with an emery board or nail file.  Do not cut corns or calluses or try to remove them with medicine.  Wear clean socks or stockings every day. Make sure they are not too tight. Do not wear knee-high stockings since they may decrease blood flow to your legs.  Wear shoes that fit properly and have enough cushioning. To break in new shoes, wear them for just a  few hours a day. This prevents you from injuring your feet. Always look in your shoes before you put them on to be sure there are no objects inside.  Do not cross your legs. This may decrease the blood flow to your feet.  If you find a minor scrape, cut, or break in the skin on your feet, keep it and the skin around it clean and dry. These areas may be cleansed with mild soap and water. Do not cleanse the area with peroxide, alcohol, or iodine.  When you remove an adhesive bandage, be sure not to damage the skin around it.  If you have a wound, look at it several times a day to make sure it is healing.  Do not use heating pads or hot water bottles. They may burn your skin. If you have lost feeling in your feet or legs, you may not know it is happening until it is too late.  Make sure your health care provider performs a complete foot exam at least annually or more often if you have foot problems. Report any cuts, sores, or bruises to your health care provider immediately. Contact a health care provider if:  You have an injury that is not healing.  You have cuts or breaks in the skin.  You have an ingrown nail.  You notice redness on your legs or feet.  You  feel burning or tingling in your legs or feet.  You have pain or cramps in your legs and feet.  Your legs or feet are numb.  Your feet always feel cold. Get help right away if:  There is increasing redness, swelling, or pain in or around a wound.  There is a red line that goes up your leg.  Pus is coming from a wound.  You develop a fever or as directed by your health care provider.  You notice a bad smell coming from an ulcer or wound. This information is not intended to replace advice given to you by your health care provider. Make sure you discuss any questions you have with your health care provider. Document Released: 02/07/2000 Document Revised: 07/18/2015 Document Reviewed: 07/19/2012 Elsevier Interactive Patient  Education  2017 [employer].

## 2016-03-10 NOTE — Telephone Encounter (Signed)
Refilled Januvia, Does she also need Creon? If so could you confirm how much she is taking of the creon a day?

## 2016-03-16 ENCOUNTER — Ambulatory Visit (INDEPENDENT_AMBULATORY_CARE_PROVIDER_SITE_OTHER): Payer: Medicaid Other | Admitting: Internal Medicine

## 2016-03-16 ENCOUNTER — Encounter: Payer: Self-pay | Admitting: Internal Medicine

## 2016-03-16 ENCOUNTER — Other Ambulatory Visit: Payer: Self-pay | Admitting: Internal Medicine

## 2016-03-16 VITALS — BP 126/55 | HR 87 | Temp 98.6°F | Ht 65.0 in | Wt 130.1 lb

## 2016-03-16 DIAGNOSIS — R634 Abnormal weight loss: Secondary | ICD-10-CM | POA: Diagnosis not present

## 2016-03-16 DIAGNOSIS — F17210 Nicotine dependence, cigarettes, uncomplicated: Secondary | ICD-10-CM | POA: Diagnosis not present

## 2016-03-16 DIAGNOSIS — N398 Other specified disorders of urinary system: Secondary | ICD-10-CM

## 2016-03-16 DIAGNOSIS — Z114 Encounter for screening for human immunodeficiency virus [HIV]: Secondary | ICD-10-CM | POA: Diagnosis present

## 2016-03-16 DIAGNOSIS — N939 Abnormal uterine and vaginal bleeding, unspecified: Secondary | ICD-10-CM

## 2016-03-16 NOTE — Assessment & Plan Note (Signed)
Checking HIV today.  

## 2016-03-16 NOTE — Telephone Encounter (Signed)
Last office visit:  03/16/2016 (in [facility]) Last UDS: unknown Last Refill: 11/28/2015 x 3rxs Next appt: 04/09/2016 w/pcp

## 2016-03-16 NOTE — Assessment & Plan Note (Signed)
She reports that for the past few weeks she has noted some sparse vaginal spotting. Initially was bright red blood but has since changed to a brown discharge. Last occurred last 4-5 days ago. Denies any malodor, itching or urinary complaints. She saw seen in 2016 with similar complaints and found to have uterine fibroids including a sub-serosal fibroid from the posterior body measuring 17 mm. She was referred to OB/GYN and underwent endometrial biopsy that was negative. Reports that her LMP was > 10 years ago. Not sexually active for the past 10 years. She had a PAP smear done last year which was negative. Declined GU exam today.  A/P Secondary to known uterine fibroids. Will refer back to OB/GYN today

## 2016-03-16 NOTE — Progress Notes (Signed)
   CC: Vaginal Discharge  HPI:  [name] is a 66 y.o. female with a past medical history listed below here today with complaints of irregular vaginal spotting. She reports that for the past few weeks she has noted some sparse vaginal spotting. Initially was bright red blood but has since changed to a brown discharge. Last occurred last 4-5 days ago. Denies any malodor, itching or urinary complaints. She saw seen in 2016 with similar complaints and found to have uterine fibroids including a sub-serosal fibroid from the posterior body measuring 17 mm. She was referred to OB/GYN and underwent endometrial biopsy that was negative. Reports that her LMP was > 10 years ago. Not sexually active for the past 10 years. She had a PAP smear done last year which was negative. Declined GU exam today.  Has complaints of weight loss. Weight is down 4 lbs since last visit. She was referred to get mammogram, colonoscopy and CT chest given smoking history. She has not gotten any of her studies done. CT was declined by medicaid. She reports a good appetite and unintentional weight loss. Continues to smoke.   Past Medical History:  Diagnosis Date  . ANEMIA, CHRONIC DISEASE NEC 03/15/2006  . Constipation due to opioid therapy 06/05/2014  . DEGENERATIVE JOINT DISEASE 05/06/2007   On going for >5 yrs No imaging to confirm Has tried ultram, mobic, neurontin which did not work Ambulance person tried once worked well    . Diabetes mellitus   . DIABETIC  RETINOPATHY 03/15/2006  . 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 Diastolic dysfunction 99991111   Grade 1 by Echocardiogram 12/13/14  . Hemorrhoids   . Hyperlipidemia 04/01/2011  . Hypertension   . Moderate aortic regurgitation 09/25/2008   12/13/14 Echocardiogram    . PANCREATITIS, CHRONIC 03/29/2006  . Peripheral arterial disease ([place])   . RENAL INSUFFICIENCY, CHRONIC 03/15/2006    Review of Systems:   Negative except as noted in HPI  Physical Exam:  Vitals:   03/16/16 1321  BP: (!) 126/55  Pulse: 87  Temp: 98.6 F (37 C)  TempSrc: Oral  SpO2: 100%  Weight: 130 lb 1.6 oz (59 kg)  Height: 5\' 5"  (1.651 m)   Constitutional: She is oriented to person, place, and time and well-developed, well-nourished, and in no distress.  Eyes: Conjunctivae are normal.  Cardiovascular: Normal rate and regular rhythm.   Murmur heard. Pulmonary/Chest: Effort normal and breath sounds normal.  Abdominal: Soft. Bowel sounds are normal. There is no tenderness.  Musculoskeletal: She exhibits no edema.  Neurological: She is alert and oriented to person, place, and time.  Skin: Skin is warm and dry.  GU: Declined exam Nursing note and vitals reviewed.  Assessment & Plan:   See Encounters Tab for problem based charting.  Patient discussed with Dr. Dareen Piano'

## 2016-03-16 NOTE — Patient Instructions (Signed)
[name],  I am going to get you referred back to the Gynecologists for your vaginal bleeding.   I would also like you to get your colonoscopy and mammogram done. I will let Dr. Heber Marion know about the CT being declined and see what we can do about it.   Please follow up with Dr. Heber [place] in the next 1-2 months.

## 2016-03-16 NOTE — Telephone Encounter (Signed)
Pt requesting refill on hydrocodone 5-325 mg.  Pt aware of 48 hrs notice.  Please call when ready for pickup.

## 2016-03-16 NOTE — Assessment & Plan Note (Addendum)
Has complaints of weight loss. Weight is down 4 lbs since last visit. She was referred to get mammogram, colonoscopy and CT chest given smoking history. She has not gotten any of her studies done. CT was declined by medicaid. She reports a good appetite and unintentional weight loss. Continues to smoke.   A/P HIV screening today Will refer back for colonoscopy and mammogram. Unclear why her CT chest was declined by medicaid. Will investigate.  Encouraged smoking cessation.

## 2016-03-17 LAB — HIV ANTIBODY (ROUTINE TESTING W REFLEX): HIV SCREEN 4TH GENERATION: NONREACTIVE

## 2016-03-18 MED ORDER — HYDROCODONE-ACETAMINOPHEN 5-325 MG PO TABS: 1.0000 | ORAL_TABLET | Freq: Four times a day (QID) | ORAL | 0 refills | Status: DC | PRN

## 2016-03-18 NOTE — Telephone Encounter (Signed)
notified

## 2016-03-18 NOTE — Telephone Encounter (Signed)
Pt calling asking about refill

## 2016-03-18 NOTE — Telephone Encounter (Signed)
She missed her appointment with me earlier this month, she must keep the appointment in February.

## 2016-03-19 NOTE — Progress Notes (Signed)
Internal Medicine Clinic Attending  Case discussed with Dr. Boswell at the time of the visit.  We reviewed the resident's history and exam and pertinent patient test results.  I agree with the assessment, diagnosis, and plan of care documented in the resident's note.  

## 2016-03-23 ENCOUNTER — Encounter: Payer: Self-pay | Admitting: Internal Medicine

## 2016-03-24 ENCOUNTER — Encounter: Payer: Self-pay | Admitting: Podiatry

## 2016-03-24 ENCOUNTER — Ambulatory Visit (INDEPENDENT_AMBULATORY_CARE_PROVIDER_SITE_OTHER): Payer: Medicaid Other | Admitting: Podiatry

## 2016-03-24 VITALS — BP 145/75 | HR 62 | Resp 14

## 2016-03-24 DIAGNOSIS — L97501 Non-pressure chronic ulcer of other part of unspecified foot limited to breakdown of skin: Secondary | ICD-10-CM | POA: Diagnosis not present

## 2016-03-24 NOTE — Patient Instructions (Signed)
Apply Silvadene cream to skin ulcer on the bottom of your right foot daily and cover with gauze Wear the surgical shoe on the right foot or the shoe that has additional padding to accommodate the insole on the right foot If you notice any sudden increase in pain, swelling, redness, fever present to emergency department  Diabetes and Foot Care Diabetes may cause you to have problems because of poor blood supply (circulation) to your feet and legs. This may cause the skin on your feet to become thinner, break easier, and heal more slowly. Your skin may become dry, and the skin may peel and crack. You may also have nerve damage in your legs and feet causing decreased feeling in them. You may not notice minor injuries to your feet that could lead to infections or more serious problems. Taking care of your feet is one of the most important things you can do for yourself. Follow these instructions at home:  Wear shoes at all times, even in the house. Do not go barefoot. Bare feet are easily injured.  Check your feet daily for blisters, cuts, and redness. If you cannot see the bottom of your feet, use a mirror or ask someone for help.  Wash your feet with warm water (do not use hot water) and mild soap. Then pat your feet and the areas between your toes until they are completely dry. Do not soak your feet as this can dry your skin.  Apply a moisturizing lotion or petroleum jelly (that does not contain alcohol and is unscented) to the skin on your feet and to dry, brittle toenails. Do not apply lotion between your toes.  Trim your toenails straight across. Do not dig under them or around the cuticle. File the edges of your nails with an emery board or nail file.  Do not cut corns or calluses or try to remove them with medicine.  Wear clean socks or stockings every day. Make sure they are not too tight. Do not wear knee-high stockings since they may decrease blood flow to your legs.  Wear shoes that fit  properly and have enough cushioning. To break in new shoes, wear them for just a few hours a day. This prevents you from injuring your feet. Always look in your shoes before you put them on to be sure there are no objects inside.  Do not cross your legs. This may decrease the blood flow to your feet.  If you find a minor scrape, cut, or break in the skin on your feet, keep it and the skin around it clean and dry. These areas may be cleansed with mild soap and water. Do not cleanse the area with peroxide, alcohol, or iodine.  When you remove an adhesive bandage, be sure not to damage the skin around it.  If you have a wound, look at it several times a day to make sure it is healing.  Do not use heating pads or hot water bottles. They may burn your skin. If you have lost feeling in your feet or legs, you may not know it is happening until it is too late.  Make sure your health care provider performs a complete foot exam at least annually or more often if you have foot problems. Report any cuts, sores, or bruises to your health care provider immediately. Contact a health care provider if:  You have an injury that is not healing.  You have cuts or breaks in the skin.  You  have an ingrown nail.  You notice redness on your legs or feet.  You feel burning or tingling in your legs or feet.  You have pain or cramps in your legs and feet.  Your legs or feet are numb.  Your feet always feel cold. Get help right away if:  There is increasing redness, swelling, or pain in or around a wound.  There is a red line that goes up your leg.  Pus is coming from a wound.  You develop a fever or as directed by your health care provider.  You notice a bad smell coming from an ulcer or wound. This information is not intended to replace advice given to you by your health care provider. Make sure you discuss any questions you have with your health care provider. Document Released: 02/07/2000 Document  Revised: 07/18/2015 Document Reviewed: 07/19/2012 Elsevier Interactive Patient Education  2017 [employer].

## 2016-03-24 NOTE — Progress Notes (Signed)
Patient ID: [name], female   DOB: [birth year], 66 y.o.   MRN: [id number]    Subjective: This patient presents for ongoing maintenance for pre-ulcerative or ulcerative plantar skin lesions on right and left feet. Currently patient wearing surgical shoe with Plastizote insole and was last seen for a superficial ulcer plantar fifth right MPJ as well as ongoing debridement of pre-ulcerative calluses. Objective: Orientated 3 DP pulses 2/4 bilaterally PT pulses trace palpable bilaterally Capillary reflex immediate bilaterally Sensation to 10 g monofilament wire intact 5/5 bilaterally Vibratory sensation reactive bilaterally Ankle reflex equal and reactive bilaterally 5 mm superficial ulcer with granular base fifth right MPJ Pre-ulcerative calluses first MPJ bilaterally and fifth MPJ left Nucleated keratoses left heel Hypertrophic, deformed, discolored toenails 6-10 (last debrided 02/25/2016)  Assessment: Diabetic with peripheral neuropathy and peripheral arterial disease Pre-ulcerative plantar calluses 3 Superficial noninfected ulcer fifth MPJ right Symptomatic mycotic toenails 6-10   Plan: Debride plantar ulcer fifth MPJ right and apply Silvadene dressing Instructed patient apply Silvadene dressing and gauze to the fifth MPJ ulcer right and wear surgical shoe Also attached a thick felt pad on patient's insole on the right shoe, as patient was not wearing the surgical shoe today Debride all pre-ulcerative plantar calluses 3without any bleeding   Reappoint 2weeks

## 2016-03-25 ENCOUNTER — Other Ambulatory Visit: Payer: Self-pay | Admitting: Internal Medicine

## 2016-03-25 MED ORDER — ATORVASTATIN CALCIUM 40 MG PO TABS: 40.0000 mg | ORAL_TABLET | Freq: Every day | ORAL | 3 refills | Status: DC

## 2016-03-25 NOTE — Telephone Encounter (Signed)
atorvastatin (LIPITOR) 40 MG tablet, refill request.

## 2016-03-25 NOTE — Telephone Encounter (Signed)
albuterol (PROVENTIL HFA) 108 (90 BASE) MCG/ACT inhaler [facility]

## 2016-04-07 NOTE — Progress Notes (Signed)
[facility] Subjective:  HPI: [name] is a 66 y.o. female who presents for follow up of DM and HTN.  Please see problem based charting below for the status of her chronic medical issues.  Review of Systems: Review of Systems  Constitutional: Negative for chills, fever, malaise/fatigue and weight loss.  Respiratory: Negative for cough and shortness of breath.   Genitourinary: Positive for flank pain. Negative for dysuria.  Musculoskeletal: Positive for back pain. Negative for falls.  Psychiatric/Behavioral: Negative for depression.    Objective:  Physical Exam: Vitals:   04/09/16 0821  BP: (!) 150/55  Pulse: 64  Temp: 97.5 F (36.4 C)  TempSrc: Oral  SpO2: 100%  Weight: 133 lb 4.8 oz (60.5 kg)  Height: 5\' 5"  (1.651 m)   Physical Exam  Constitutional: She is well-developed, well-nourished, and in no distress.  HENT:  Mouth/Throat: Oropharynx is clear and moist.  Eyes: Conjunctivae are normal.  Cardiovascular: Normal rate and regular rhythm.   Pulmonary/Chest: Effort normal and breath sounds normal. She has no wheezes.  Abdominal: Soft. Bowel sounds are normal. There is no tenderness.  Musculoskeletal: She exhibits no edema.  Nursing note and vitals reviewed.  Assessment & Plan:  Healthcare maintenance Has been referred multiple times for mammography but has not obtained.    Need to discuss if this is important to her.  Loss of weight I have addressed this issue with her multiple times over the last several years.  She is very concerned if she has any weight loss.  Her weight has been overall pretty stable with some slight loss intermittently over the last several years.  She has not had any seriously concerned drop in weight.  We have previously discussed getting up-to-date on multiple CA screening issues but she has not followed through in getting these done.  Last month she was seen by a colleague here in the clinic and again reported  her concerns for weight loss, it appears her weight was unchanged from when I saw her last. She was referred to GI at that time for "loss of weight."  About 6 months ago I had referred her to GI for a repeat colonoscopy. [name] saw her and had planned for a repeat screening colonoscopy but this does not appear to have happened (per telephone note her driver was admitted to the hospital).  She has had issues before with missing Creon dosing for her pancreatic insufficiency and had reported steatorhea to [name].  He has increased her creon at that time. I am not sure that she needs a repeat GI evaluation, she simply needs a screening colonoscopy.     As seen below weight is stable over last 6 months. Wt Readings from Last 5 Encounters:  04/09/16 133 lb 4.8 oz (60.5 kg)  03/16/16 130 lb 1.6 oz (59 kg)  11/28/15 134 lb 8 oz (61 kg)  11/11/15 130 lb (59 kg)  09/24/15 136 lb 9.6 oz (62 kg)   Assessment: Patient reported loss of weight  Plan: I discussed again with [name] that while her weight did trend down a little bit last year it has remained stable over the last 6 months. I suspect this may have been somewhat due to underdosing Creon. I recommend that she get up-to-date on her age-appropriate cancer screening which has previously been ordered. I asked her to do her part to follow through in obtaining mammography, follow-up with OB/GYN, colonoscopy. Once he's been done we can discuss  attempting to reorder low dose chest CT for lung cancer screening.  Bilateral carotid artery disease ([place]) She has yet to get the carotid dopplers done.  I have reminded her of the reason for this and the importance.  I'm not sure given her noncompliance that she will get this done. She does have many risk factors as well as his radiographic evidence therefore I have gone ahead and asked her to start taking an 81 mg aspirin daily.  Assessment: Bilateral carotid artery disease  Plan: Obtain Dopplers, start 81  mg aspirin  Essential hypertension HPI: Tolerating medication no complaints  A: Essential HTN, above goal  P: Continue losartan 100 mg daily Stressed importance of low salt diet.   Pancreatic insufficiency History of present illness: She is previously complained a lot of weight loss, a few months ago she was having some increased fatty stools she called [name] office and was instructed to increase her Creon. She reports that she is now taking 4 pills of Creon with meals and that she has had no steatorrhea. Her weight appears to be stable.  Assessment pancreatic insufficiency  Plan  Continue Creon  Type 2 diabetes mellitus, controlled, with renal complications (HCC) HPI: She has been taking Januvia daily her A1c has increased over the last 2 checks. She is no longer taking any Lantus. She reports the reason for the increased A1c is dietary indiscretion with candy.,   A: Type 2 DM with CKD3  P: -Januvia 50mg  daily -Discussed the importance of low sugar, low carbohydrate diet. Will repeat a BMP, her creatinine appears to be roughly stable we could consider starting a low-dose of 500 mg of metformin daily depending on her renal function I discussed this with her today and we'll call her with plans.  Back pain History of present illness: She continues to have some low back pain, occasionally worse on the left side but is grossly midline. She denies any radicular symptoms. She has been taking Norco 06/26/2023 twice a day.  A: Chronic low back pain  P: We'll obtain urine drug screen today. I expect this to be positive for hydrocodone. She reports that 2 pills a day is working well for her and she is able to function around the house. Her varicose predominately prescribed for diabetic neuropathy however should be helping with her low back pain. I reviewed the [place] narcotic database today, as discussed previously the name the database is slightly different spelling of her first name.  However her hydrocodone prescriptions are from the and filled same pharmacy roughly on a exactly one month basis. The Lyrica appears to be refilled slightly over one month so she may not be taking not quite as frequently.  After this visit I have real last and we do not have a controlled substance agreement on file and will need to have this filled out during her next visit    Medications Ordered Meds ordered this encounter  Medications  . ipratropium (ATROVENT HFA) 17 MCG/ACT inhaler    Sig: inhale 1 to 2 puffs every 6 hours if needed    Dispense:  12.9 g    Refill:  0  . DISCONTD: HYDROcodone-acetaminophen (NORCO) 5-325 MG tablet    Sig: Take 1 tablet by mouth every 6 (six) hours as needed for moderate pain.    Dispense:  60 tablet    Refill:  0    Rx 1/2 to be filled 30 days after previous script  . HYDROcodone-acetaminophen (NORCO) 5-325 MG tablet  Sig: Take 1 tablet by mouth every 6 (six) hours as needed for moderate pain.    Dispense:  60 tablet    Refill:  0    Rx 2/2 to be filled 30 days after previous script  . aspirin EC 81 MG tablet    Sig: Take 1 tablet (81 mg total) by mouth daily.   Other Orders Orders Placed This Encounter  Procedures  . Glucose, capillary  . ToxAssure Select,+Antidepr,UR  . BMP8+Anion Gap  . POCT HgB A1C (CPT [id number])   Follow Up: Return in about 2 months (around 06/07/2016).

## 2016-04-07 NOTE — Assessment & Plan Note (Signed)
Has been referred multiple times for mammography but has not obtained.    Need to discuss if this is important to her.

## 2016-04-07 NOTE — Assessment & Plan Note (Addendum)
I have addressed this issue with her multiple times over the last several years.  She is very concerned if she has any weight loss.  Her weight has been overall pretty stable with some slight loss intermittently over the last several years.  She has not had any seriously concerned drop in weight.  We have previously discussed getting up-to-date on multiple CA screening issues but she has not followed through in getting these done.  Last month she was seen by a colleague here in the clinic and again reported her concerns for weight loss, it appears her weight was unchanged from when I saw her last. She was referred to GI at that time for "loss of weight."  About 6 months ago I had referred her to GI for a repeat colonoscopy. Dr Ardis Hughs saw her and had planned for a repeat screening colonoscopy but this does not appear to have happened (per telephone note her driver was admitted to the hospital).  She has had issues before with missing Creon dosing for her pancreatic insufficiency and had reported steatorhea to Dr Ardis Hughs.  He has increased her creon at that time. I am not sure that she needs a repeat GI evaluation, she simply needs a screening colonoscopy.     As seen below weight is stable over last 6 months. Wt Readings from Last 5 Encounters:  04/09/16 133 lb 4.8 oz (60.5 kg)  03/16/16 130 lb 1.6 oz (59 kg)  11/28/15 134 lb 8 oz (61 kg)  11/11/15 130 lb (59 kg)  09/24/15 136 lb 9.6 oz (62 kg)   Assessment: Patient reported loss of weight  Plan: I discussed again with [name] that while her weight did trend down a little bit last year it has remained stable over the last 6 months. I suspect this may have been somewhat due to underdosing Creon. I recommend that she get up-to-date on her age-appropriate cancer screening which has previously been ordered. I asked her to do her part to follow through in obtaining mammography, follow-up with OB/GYN, colonoscopy. Once he's been done we can discuss  attempting to reorder low dose chest CT for lung cancer screening.

## 2016-04-09 ENCOUNTER — Encounter: Payer: Self-pay | Admitting: Internal Medicine

## 2016-04-09 ENCOUNTER — Ambulatory Visit (INDEPENDENT_AMBULATORY_CARE_PROVIDER_SITE_OTHER): Payer: Medicaid Other | Admitting: Internal Medicine

## 2016-04-09 VITALS — BP 150/55 | HR 64 | Temp 97.5°F | Ht 65.0 in | Wt 133.3 lb

## 2016-04-09 DIAGNOSIS — F17210 Nicotine dependence, cigarettes, uncomplicated: Secondary | ICD-10-CM

## 2016-04-09 DIAGNOSIS — I129 Hypertensive chronic kidney disease with stage 1 through stage 4 chronic kidney disease, or unspecified chronic kidney disease: Secondary | ICD-10-CM | POA: Diagnosis not present

## 2016-04-09 DIAGNOSIS — Z7984 Long term (current) use of oral hypoglycemic drugs: Secondary | ICD-10-CM | POA: Diagnosis not present

## 2016-04-09 DIAGNOSIS — Z0000 Encounter for general adult medical examination without abnormal findings: Secondary | ICD-10-CM

## 2016-04-09 DIAGNOSIS — I779 Disorder of arteries and arterioles, unspecified: Secondary | ICD-10-CM

## 2016-04-09 DIAGNOSIS — I739 Peripheral vascular disease, unspecified: Secondary | ICD-10-CM

## 2016-04-09 DIAGNOSIS — E1122 Type 2 diabetes mellitus with diabetic chronic kidney disease: Secondary | ICD-10-CM | POA: Diagnosis present

## 2016-04-09 DIAGNOSIS — Z23 Encounter for immunization: Secondary | ICD-10-CM

## 2016-04-09 DIAGNOSIS — M545 Low back pain, unspecified: Secondary | ICD-10-CM

## 2016-04-09 DIAGNOSIS — I6523 Occlusion and stenosis of bilateral carotid arteries: Secondary | ICD-10-CM

## 2016-04-09 DIAGNOSIS — Z79899 Other long term (current) drug therapy: Secondary | ICD-10-CM

## 2016-04-09 DIAGNOSIS — K8689 Other specified diseases of pancreas: Secondary | ICD-10-CM | POA: Diagnosis not present

## 2016-04-09 DIAGNOSIS — N183 Chronic kidney disease, stage 3 unspecified: Secondary | ICD-10-CM

## 2016-04-09 DIAGNOSIS — Z9119 Patient's noncompliance with other medical treatment and regimen: Secondary | ICD-10-CM

## 2016-04-09 DIAGNOSIS — G8929 Other chronic pain: Secondary | ICD-10-CM | POA: Diagnosis not present

## 2016-04-09 DIAGNOSIS — I10 Essential (primary) hypertension: Secondary | ICD-10-CM

## 2016-04-09 DIAGNOSIS — Z79891 Long term (current) use of opiate analgesic: Secondary | ICD-10-CM | POA: Diagnosis not present

## 2016-04-09 DIAGNOSIS — R634 Abnormal weight loss: Secondary | ICD-10-CM

## 2016-04-09 DIAGNOSIS — Z794 Long term (current) use of insulin: Secondary | ICD-10-CM

## 2016-04-09 LAB — POCT GLYCOSYLATED HEMOGLOBIN (HGB A1C): HEMOGLOBIN A1C: 8.3

## 2016-04-09 LAB — GLUCOSE, CAPILLARY: GLUCOSE-CAPILLARY: 119 mg/dL — AB (ref 65–99)

## 2016-04-09 MED ORDER — HYDROCODONE-ACETAMINOPHEN 5-325 MG PO TABS: 1.0000 | ORAL_TABLET | Freq: Four times a day (QID) | ORAL | 0 refills | Status: DC | PRN

## 2016-04-09 MED ORDER — ASPIRIN EC 81 MG PO TBEC: 81.0000 mg | DELAYED_RELEASE_TABLET | Freq: Every day | ORAL | Status: AC

## 2016-04-09 MED ORDER — IPRATROPIUM BROMIDE HFA 17 MCG/ACT IN AERS: INHALATION_SPRAY | RESPIRATORY_TRACT | 0 refills | Status: DC

## 2016-04-09 NOTE — Assessment & Plan Note (Signed)
HPI: She has been taking Januvia daily her A1c has increased over the last 2 checks. She is no longer taking any Lantus. She reports the reason for the increased A1c is dietary indiscretion with candy.,   A: Type 2 DM with CKD3  P: -Januvia 50mg  daily -Discussed the importance of low sugar, low carbohydrate diet. Will repeat a BMP, her creatinine appears to be roughly stable we could consider starting a low-dose of 500 mg of metformin daily depending on her renal function I discussed this with her today and we'll call her with plans.

## 2016-04-09 NOTE — Assessment & Plan Note (Addendum)
History of present illness: She continues to have some low back pain, occasionally worse on the left side but is grossly midline. She denies any radicular symptoms. She has been taking Norco 06/26/2023 twice a day.  A: Chronic low back pain  P: We'll obtain urine drug screen today. I expect this to be positive for hydrocodone. She reports that 2 pills a day is working well for her and she is able to function around the house. Her varicose predominately prescribed for diabetic neuropathy however should be helping with her low back pain. I reviewed the [place] narcotic database today, as discussed previously the name the database is slightly different spelling of her first name. However her hydrocodone prescriptions are from the and filled same pharmacy roughly on a exactly one month basis. The Lyrica appears to be refilled slightly over one month so she may not be taking not quite as frequently.  After this visit I have real last and we do not have a controlled substance agreement on file and will need to have this filled out during her next visit

## 2016-04-09 NOTE — Assessment & Plan Note (Signed)
HPI: Tolerating medication no complaints  A: Essential HTN, above goal  P: Continue losartan 100 mg daily Stressed importance of low salt diet.

## 2016-04-09 NOTE — Patient Instructions (Addendum)
It is very important to keep your appointments! You have upcoming appointments with Dr Elly Modena with OB/GYN.  As well as Dr Henrene Pastor for a colonoscopy.  The other issues I need you to complete before our next visit is getting the mammogram completed as well as the carotid artery duplex.

## 2016-04-09 NOTE — Assessment & Plan Note (Signed)
History of present illness: She is previously complained a lot of weight loss, a few months ago she was having some increased fatty stools she called Dr. Blanch Media office and was instructed to increase her Creon. She reports that she is now taking 4 pills of Creon with meals and that she has had no steatorrhea. Her weight appears to be stable.  Assessment pancreatic insufficiency  Plan  Continue Creon

## 2016-04-09 NOTE — Assessment & Plan Note (Addendum)
She has yet to get the carotid dopplers done.  I have reminded her of the reason for this and the importance.  I'm not sure given her noncompliance that she will get this done. She does have many risk factors as well as his radiographic evidence therefore I have gone ahead and asked her to start taking an 81 mg aspirin daily.  Assessment: Bilateral carotid artery disease  Plan: Obtain Dopplers, start 81 mg aspirin

## 2016-04-10 LAB — BMP8+ANION GAP
Anion Gap: 17 mmol/L (ref 10.0–18.0)
BUN / CREAT RATIO: 34 — AB (ref 12–28)
BUN: 36 mg/dL — AB (ref 8–27)
CO2: 20 mmol/L (ref 18–29)
CREATININE: 1.06 mg/dL — AB (ref 0.57–1.00)
Calcium: 8.7 mg/dL (ref 8.7–10.3)
Chloride: 103 mmol/L (ref 96–106)
GFR calc Af Amer: 64 mL/min/{1.73_m2} (ref >59)
GFR, EST NON AFRICAN AMERICAN: 56 mL/min/{1.73_m2} — AB (ref >59)
Glucose: 124 mg/dL — ABNORMAL HIGH (ref 65–99)
Potassium: 4.2 mmol/L (ref 3.5–5.2)
Sodium: 140 mmol/L (ref 134–144)

## 2016-04-10 MED ORDER — METFORMIN HCL ER 500 MG PO TB24: 500.0000 mg | ORAL_TABLET | Freq: Every day | ORAL | 3 refills | Status: DC

## 2016-04-10 NOTE — Addendum Note (Signed)
Addended by: Lucious Groves on: 04/10/2016 10:38 AM   Modules accepted: Orders

## 2016-04-14 ENCOUNTER — Encounter: Payer: Medicaid Other | Admitting: Podiatry

## 2016-04-14 NOTE — Progress Notes (Signed)
This encounter was created in error - please disregard.

## 2016-04-17 ENCOUNTER — Encounter (HOSPITAL_COMMUNITY): Payer: Self-pay | Admitting: Emergency Medicine

## 2016-04-17 ENCOUNTER — Emergency Department (HOSPITAL_COMMUNITY)
Admission: EM | Admit: 2016-04-17 | Discharge: 2016-04-18 | Disposition: A | Discharge status: Home / Self Care | Payer: Medicaid Other | Attending: Emergency Medicine | Admitting: Emergency Medicine

## 2016-04-17 DIAGNOSIS — E11319 Type 2 diabetes mellitus with unspecified diabetic retinopathy without macular edema: Secondary | ICD-10-CM | POA: Diagnosis not present

## 2016-04-17 DIAGNOSIS — E1122 Type 2 diabetes mellitus with diabetic chronic kidney disease: Secondary | ICD-10-CM | POA: Insufficient documentation

## 2016-04-17 DIAGNOSIS — N183 Chronic kidney disease, stage 3 (moderate): Secondary | ICD-10-CM | POA: Diagnosis not present

## 2016-04-17 DIAGNOSIS — N939 Abnormal uterine and vaginal bleeding, unspecified: Secondary | ICD-10-CM

## 2016-04-17 DIAGNOSIS — E1140 Type 2 diabetes mellitus with diabetic neuropathy, unspecified: Secondary | ICD-10-CM | POA: Diagnosis not present

## 2016-04-17 DIAGNOSIS — I129 Hypertensive chronic kidney disease with stage 1 through stage 4 chronic kidney disease, or unspecified chronic kidney disease: Secondary | ICD-10-CM | POA: Insufficient documentation

## 2016-04-17 DIAGNOSIS — R102 Pelvic and perineal pain: Secondary | ICD-10-CM

## 2016-04-17 DIAGNOSIS — Z7984 Long term (current) use of oral hypoglycemic drugs: Secondary | ICD-10-CM | POA: Insufficient documentation

## 2016-04-17 DIAGNOSIS — I2510 Atherosclerotic heart disease of native coronary artery without angina pectoris: Secondary | ICD-10-CM | POA: Insufficient documentation

## 2016-04-17 DIAGNOSIS — Z7982 Long term (current) use of aspirin: Secondary | ICD-10-CM | POA: Diagnosis not present

## 2016-04-17 DIAGNOSIS — F17210 Nicotine dependence, cigarettes, uncomplicated: Secondary | ICD-10-CM | POA: Insufficient documentation

## 2016-04-17 DIAGNOSIS — Z79899 Other long term (current) drug therapy: Secondary | ICD-10-CM | POA: Diagnosis not present

## 2016-04-17 LAB — URINALYSIS, ROUTINE W REFLEX MICROSCOPIC
BILIRUBIN URINE: NEGATIVE
Glucose, UA: NEGATIVE mg/dL
KETONES UR: NEGATIVE mg/dL
NITRITE: NEGATIVE
PH: 5 (ref 5.0–8.0)
Protein, ur: 30 mg/dL — AB
Specific Gravity, Urine: 1.012 (ref 1.005–1.030)

## 2016-04-17 NOTE — ED Triage Notes (Addendum)
C/o intermittent vaginal pain since Wednesday.  States she has seen PCP for same and is suppose to get appt at [facility] for further testing.

## 2016-04-18 ENCOUNTER — Emergency Department (HOSPITAL_COMMUNITY): Payer: Medicaid Other

## 2016-04-18 LAB — WET PREP, GENITAL
Clue Cells Wet Prep HPF POC: NONE SEEN
SPERM: NONE SEEN
TRICH WET PREP: NONE SEEN
YEAST WET PREP: NONE SEEN

## 2016-04-18 LAB — TOXASSURE SELECT,+ANTIDEPR,UR

## 2016-04-18 MED ORDER — IBUPROFEN 600 MG PO TABS: 600.0000 mg | ORAL_TABLET | Freq: Three times a day (TID) | ORAL | 0 refills | Status: DC

## 2016-04-18 NOTE — Discharge Instructions (Signed)
As we discussed, your ultra sound today did show some uterine fibroids and thickened wall of your uterus.  It is recommended that you have further testing with OB-GYN.   Please follow-up at the women's clinic for further evaluation. Call to make an appointment. Can take motrin for pain when needed. May follow-up with your primary care doctor as well.  Return here for new concerns.

## 2016-04-18 NOTE — ED Provider Notes (Signed)
[facility] DEPT Provider Note   CSN: [id number] Arrival date & time: 04/17/16  2132     History   Chief Complaint Chief Complaint  Patient presents with  . Vaginal Pain    HPI [name] is a 66 y.o. female.  The history is provided by the patient and medical records.    66 year old female with history of anemia, diabetes, hyperlipidemia, hypertension, presenting to the ED for vaginal pain. She reports this is been ongoing for about 3 days now. States it feels like sharp, stabbing pains in her suprapubic region with some radiation into her vagina. States abdominal pain has resolved at this time however.  She denies any discharge. She has been having some abnormal spotting. She denies any nausea or vomiting. States she does have history of fibroids and has had dysfunctional uterine bleeding in the past. States she was seen at [facility] for this previously and had a uterine biopsy which she was told was normal. She should follow-up with her doctor afterwards, but was not having bleeding at that time. Reports it started again on 02/22/2016, has been occurring every few weeks since then. She denies any urinary symptoms.    Past Medical History:  Diagnosis Date  . ANEMIA, CHRONIC DISEASE NEC 03/15/2006  . Constipation due to opioid therapy 06/05/2014  . DEGENERATIVE JOINT DISEASE 05/06/2007   On going for >5 yrs No imaging to confirm Has tried ultram, mobic, neurontin which did not work Ambulance person tried once worked well    . Diabetes mellitus   . DIABETIC  RETINOPATHY 03/15/2006  . 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 Diastolic dysfunction 75/11/2583   Grade 1 by Echocardiogram 12/13/14  . Hemorrhoids   . Hyperlipidemia 04/01/2011  . Hypertension   . Moderate aortic regurgitation 09/25/2008   12/13/14 Echocardiogram    . PANCREATITIS, CHRONIC 03/29/2006  . Peripheral arterial disease ([place])   . RENAL INSUFFICIENCY, CHRONIC 03/15/2006    Patient Active Problem List   Diagnosis Date Noted  . Long term (current) use of opiate analgesic 04/09/2016  . Bilateral carotid artery disease ([place]) 11/28/2015  . Pancreatic insufficiency 05/09/2015  . 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 Risk for coronary artery disease greater than 20% in next 10 years 03/14/2015  . Diastolic dysfunction 27/78/2423  . Vulvar cyst 01/23/2015  . Abnormal uterine bleeding 12/02/2014  . Unexplained night sweats 12/02/2014  . Constipation due to opioid therapy 06/05/2014  . Loss of weight 03/29/2014  . Peripheral arterial disease ([place]) 12/12/2013  . GERD (gastroesophageal reflux disease) 10/12/2012  . Healthcare maintenance 09/29/2012  . Back pain 03/28/2012  . Hyperlipidemia 04/01/2011  . BOILS, RECURRENT 03/13/2010  . Moderate aortic regurgitation 09/25/2008  . HEMORRHOIDS 06/08/2008  . Osteoarthritis 05/06/2007  . TOBACCO ABUSE 09/27/2006  . History of osteoporosis 06/29/2006  . PANCREATITIS, CHRONIC 03/29/2006  . Type 2 diabetes mellitus, controlled, with renal complications ([place]) 53/61/4431  . Diabetic neuropathy, painful ([place]) 03/15/2006  . Anemia of chronic disease 03/15/2006  . Essential hypertension 03/15/2006  . VENTRICULAR HYPERTROPHY, LEFT 03/15/2006  . CKD stage 3 due to type 2 diabetes mellitus ([place]) 03/15/2006    Past Surgical History:  Procedure Laterality Date  . BREAST SURGERY    . MOUTH SURGERY N/A     OB History    No data available       Home Medications    Prior to Admission medications   Medication Sig Start Date End Date Taking? Authorizing Provider  ACCU-CHEK AVIVA PLUS  test strip TEST three times a day 11/25/15  Yes Lucious Groves, DO  acetaminophen (TYLENOL) 500 MG tablet Take 1,000 mg by mouth every 6 (six) hours as needed (pain).   Yes Historical Provider, MD  aspirin EC 81 MG tablet Take 1 tablet (81 mg total) by mouth daily. 04/09/16  Yes Lucious Groves, DO  atorvastatin (LIPITOR) 40 MG tablet Take 1 tablet (40 mg total) by  mouth daily. 03/25/16 03/25/17 Yes Lucious Groves, DO  Blood Glucose Monitoring Suppl (ACCU-CHEK AVIVA PLUS) w/Device KIT Use to check you blood sugar 3 times a day 06/04/15  Yes Oval Linsey, MD  cyclobenzaprine (FLEXERIL) 5 MG tablet Take 1 tablet (5 mg total) by mouth 3 (three) times daily as needed for muscle spasms. 11/11/15  Yes Roxanna Mew, PA-C  HYDROcodone-acetaminophen (NORCO) 5-325 MG tablet Take 1 tablet by mouth every 6 (six) hours as needed for moderate pain. 04/09/16  Yes Lucious Groves, DO  ipratropium (ATROVENT HFA) 17 MCG/ACT inhaler inhale 1 to 2 puffs every 6 hours if needed 04/09/16  Yes Lucious Groves, DO  losartan (COZAAR) 100 MG tablet Take 1 tablet (100 mg total) by mouth daily. 01/24/16  Yes Lucious Groves, DO  metFORMIN (GLUCOPHAGE-XR) 500 MG 24 hr tablet Take 1 tablet (500 mg total) by mouth daily with breakfast. 04/10/16  Yes Lucious Groves, DO  Multiple Vitamin (MULTIVITAMIN WITH MINERALS) TABS tablet Take 1 tablet by mouth daily.   Yes Historical Provider, MD  Pancrelipase, Lip-Prot-Amyl, 24000-76000 units CPEP Take 4 capsules (96,000 Units total) by mouth See admin instructions. Take 4 capsules by mouth 2-3 times daily before meals 03/10/16  Yes Lucious Groves, DO  polyethylene glycol (MIRALAX / GLYCOLAX) packet Take 17 g by mouth daily as needed for mild constipation. 10/24/15  Yes Oval Linsey, MD  pregabalin (LYRICA) 75 MG capsule Take 1 capsule (75 mg total) by mouth 2 (two) times daily. 12/11/15 12/10/16 Yes Lucious Groves, DO  promethazine (PHENERGAN) 12.5 MG tablet Take 1 tablet (12.5 mg total) by mouth every 6 (six) hours as needed for nausea or vomiting. 05/09/15  Yes Lucious Groves, DO  silver sulfADIAZINE (SILVADENE) 1 % cream Apply 1 application topically daily. 02/25/16  Yes Richard C Tuchman, DPM  sitaGLIPtin (JANUVIA) 50 MG tablet Take 1 tablet (50 mg total) by mouth daily. 03/10/16  Yes Lucious Groves, DO  travoprost, benzalkonium, (TRAVATAN) 0.004 %  ophthalmic solution Place 1 drop into both eyes at bedtime.    Historical Provider, MD    Family History Family History  Problem Relation Age of Onset  . Dementia Mother     Social History Social History  Substance Use Topics  . Smoking status: Current Some Day Smoker    Packs/day: 0.50    Types: Cigarettes  . Smokeless tobacco: Never Used  . Alcohol use No     Allergies   Gabapentin and Nicorette [nicotine]   Review of Systems Review of Systems  Genitourinary: Positive for vaginal pain.  All other systems reviewed and are negative.    Physical Exam Updated Vital Signs BP 174/62 (BP Location: Left Arm)   Pulse 67   Temp 98.1 F (36.7 C) (Oral)   Resp 19   SpO2 98%   Physical Exam  Constitutional: She is oriented to person, place, and time. She appears well-developed and well-nourished.  HENT:  Head: Normocephalic and atraumatic.  Mouth/Throat: Oropharynx is clear and moist.  Eyes: Conjunctivae and EOM are  normal. Pupils are equal, round, and reactive to light.  Neck: Normal range of motion.  Cardiovascular: Normal rate, regular rhythm and normal heart sounds.   Pulmonary/Chest: Effort normal and breath sounds normal. No respiratory distress. She has no wheezes.  Abdominal: Soft. Bowel sounds are normal. There is no tenderness. There is no rebound.  Abdomen soft, non-tender, no peritoneal signs  Genitourinary:  Genitourinary Comments: Exam chaperoned by RN Normal female external genitalia without visible lesions or rashes; no appreciable discharge, cervix does appear friable with small amount of bleeding; no adnexal or CMT  Musculoskeletal: Normal range of motion.  Neurological: She is alert and oriented to person, place, and time.  Skin: Skin is warm and dry.  Psychiatric: She has a normal mood and affect.  Nursing note and vitals reviewed.    ED Treatments / Results  Labs (all labs ordered are listed, but only abnormal results are displayed) Labs  Reviewed  WET PREP, GENITAL - Abnormal; Notable for the following:       Result Value   WBC, Wet Prep HPF POC FEW (*)    All other components within normal limits  URINALYSIS, ROUTINE W REFLEX MICROSCOPIC - Abnormal; Notable for the following:    Hgb urine dipstick SMALL (*)    Protein, ur 30 (*)    Leukocytes, UA LARGE (*)    Bacteria, UA RARE (*)    Squamous Epithelial / LPF 0-5 (*)    All other components within normal limits  URINE CULTURE  GC/CHLAMYDIA PROBE AMP ([place]) NOT AT [facility]    EKG  EKG Interpretation None       Radiology US Transvaginal Non-ob  Result Date: 04/18/2016 CLINICAL DATA:  66 year old female with pelvic pain and vaginal bleeding. EXAM: TRANSABDOMINAL AND TRANSVAGINAL ULTRASOUND OF PELVIS DOPPLER ULTRASOUND OF OVARIES TECHNIQUE: Both transabdominal and transvaginal ultrasound examinations of the pelvis were performed. Transabdominal technique was performed for global imaging of the pelvis including uterus, ovaries, adnexal regions, and pelvic cul-de-sac. It was necessary to proceed with endovaginal exam following the transabdominal exam to visualize the endometrium and the ovaries. Color and duplex Doppler ultrasound was utilized to evaluate blood flow to the ovaries. COMPARISON:  Abdominal CT dated 11/06/2014 FINDINGS: Evaluation of the adnexa is limited due to bowel gas. Uterus Measurements: 7.2 x 4.7 x 5.8 cm. The uterus is retroflexed. The uterus is heterogeneous with multiple fibroids. There is a 1.5 x 1.2 x 1.4 cm and a 1.2 x 0.9 x 1.2 cm posterior body intramural fibroid. A 2.1 x 1.9 x 2.3 cm left anterior body exophytic fibroid noted. Endometrium Thickness: There is diffuse thickened appearance of the visualized endometrium measuring 17 mm in thickness. No definite focal abnormal vascularity within the endometrium. In a postmenopausal female findings may represent endometrial hyperplasia, polyp or cancer. Further evaluation with hysteroscopy is  recommended. Right ovary Not visualized Left ovary Not visualized Other findings There is moderate amount of free fluid within the pelvis. IMPRESSION: 1. Heterogeneous uterus with multiple small fibroids. 2. Diffusely thickened endometrium. Hysteroscopy is recommended further evaluation. 3. Nonvisualization of the ovaries. 4. Moderate free fluid within the pelvis of indeterminate etiology. Clinical correlation is recommended. Electronically Signed   By: Anner Crete M.D.   On: 04/18/2016 02:09   US Pelvis Complete  Result Date: 04/18/2016 CLINICAL DATA:  66 year old female with pelvic pain and vaginal bleeding. EXAM: TRANSABDOMINAL AND TRANSVAGINAL ULTRASOUND OF PELVIS DOPPLER ULTRASOUND OF OVARIES TECHNIQUE: Both transabdominal and transvaginal ultrasound examinations of the pelvis were performed.  Transabdominal technique was performed for global imaging of the pelvis including uterus, ovaries, adnexal regions, and pelvic cul-de-sac. It was necessary to proceed with endovaginal exam following the transabdominal exam to visualize the endometrium and the ovaries. Color and duplex Doppler ultrasound was utilized to evaluate blood flow to the ovaries. COMPARISON:  Abdominal CT dated 11/06/2014 FINDINGS: Evaluation of the adnexa is limited due to bowel gas. Uterus Measurements: 7.2 x 4.7 x 5.8 cm. The uterus is retroflexed. The uterus is heterogeneous with multiple fibroids. There is a 1.5 x 1.2 x 1.4 cm and a 1.2 x 0.9 x 1.2 cm posterior body intramural fibroid. A 2.1 x 1.9 x 2.3 cm left anterior body exophytic fibroid noted. Endometrium Thickness: There is diffuse thickened appearance of the visualized endometrium measuring 17 mm in thickness. No definite focal abnormal vascularity within the endometrium. In a postmenopausal female findings may represent endometrial hyperplasia, polyp or cancer. Further evaluation with hysteroscopy is recommended. Right ovary Not visualized Left ovary Not visualized Other  findings There is moderate amount of free fluid within the pelvis. IMPRESSION: 1. Heterogeneous uterus with multiple small fibroids. 2. Diffusely thickened endometrium. Hysteroscopy is recommended further evaluation. 3. Nonvisualization of the ovaries. 4. Moderate free fluid within the pelvis of indeterminate etiology. Clinical correlation is recommended. Electronically Signed   By: Anner Crete M.D.   On: 04/18/2016 02:09   [place] Art/ven Flow Abd Pelv Doppler  Result Date: 04/18/2016 CLINICAL DATA:  66 year old female with pelvic pain and vaginal bleeding. EXAM: TRANSABDOMINAL AND TRANSVAGINAL ULTRASOUND OF PELVIS DOPPLER ULTRASOUND OF OVARIES TECHNIQUE: Both transabdominal and transvaginal ultrasound examinations of the pelvis were performed. Transabdominal technique was performed for global imaging of the pelvis including uterus, ovaries, adnexal regions, and pelvic cul-de-sac. It was necessary to proceed with endovaginal exam following the transabdominal exam to visualize the endometrium and the ovaries. Color and duplex Doppler ultrasound was utilized to evaluate blood flow to the ovaries. COMPARISON:  Abdominal CT dated 11/06/2014 FINDINGS: Evaluation of the adnexa is limited due to bowel gas. Uterus Measurements: 7.2 x 4.7 x 5.8 cm. The uterus is retroflexed. The uterus is heterogeneous with multiple fibroids. There is a 1.5 x 1.2 x 1.4 cm and a 1.2 x 0.9 x 1.2 cm posterior body intramural fibroid. A 2.1 x 1.9 x 2.3 cm left anterior body exophytic fibroid noted. Endometrium Thickness: There is diffuse thickened appearance of the visualized endometrium measuring 17 mm in thickness. No definite focal abnormal vascularity within the endometrium. In a postmenopausal female findings may represent endometrial hyperplasia, polyp or cancer. Further evaluation with hysteroscopy is recommended. Right ovary Not visualized Left ovary Not visualized Other findings There is moderate amount of free fluid within the  pelvis. IMPRESSION: 1. Heterogeneous uterus with multiple small fibroids. 2. Diffusely thickened endometrium. Hysteroscopy is recommended further evaluation. 3. Nonvisualization of the ovaries. 4. Moderate free fluid within the pelvis of indeterminate etiology. Clinical correlation is recommended. Electronically Signed   By: Anner Crete M.D.   On: 04/18/2016 02:09    Procedures Procedures (including critical care time)  Medications Ordered in ED Medications - No data to display   Initial Impression / Assessment and Plan / ED Course  I have reviewed the triage vital signs and the nursing notes.  Pertinent labs & imaging results that were available during my care of the patient were reviewed by me and considered in my medical decision making (see chart for details).  66 year old female here with vaginal pain. She does report some suprapubic pain  recently, none currently. She is afebrile and nontoxic. Her abdomen is soft and benign.  Pelvic exam was performed-- no visible external lesions or rash, no discharge. Her cervix is friable with small amount of bleeding.  There is no adnexal or cervical motion tenderness. UA with large leuks, but rare bacteria. Patient denies any current urinary symptoms. Will send for culture. Given her age and history of abnormal bleeding, ultrasound was obtained which again reveals uterine fibroids and thickened endometrium. It was recommended she have OB/GYN evaluation for hysteroscopy.  Wet prep negative aside from [facility]. Gc/chl pending.  Will refer back to women's clinic who she saw previously for ongoing work-up.  Discussed plan with patient, she acknowledged understanding and agreed with plan of care.  Return precautions given for new or worsening symptoms.  Final Clinical Impressions(s) / ED Diagnoses   Final diagnoses:  Abnormal vaginal bleeding  Vaginal pain    New Prescriptions Discharge Medication List as of 04/18/2016  3:05 AM    START taking these  medications   Details  ibuprofen (ADVIL,MOTRIN) 600 MG tablet Take 1 tablet (600 mg total) by mouth 3 (three) times daily., Starting Sat 04/18/2016, Print         Larene Pickett, PA-C 04/18/16 [place], DO 04/18/16 [id number]

## 2016-04-18 NOTE — ED Notes (Signed)
Patient transported to Ultrasound 

## 2016-04-19 LAB — URINE CULTURE

## 2016-04-20 LAB — GC/CHLAMYDIA PROBE AMP (~~LOC~~) NOT AT ARMC
CHLAMYDIA, DNA PROBE: NEGATIVE
NEISSERIA GONORRHEA: NEGATIVE

## 2016-04-29 ENCOUNTER — Ambulatory Visit (INDEPENDENT_AMBULATORY_CARE_PROVIDER_SITE_OTHER): Payer: Medicaid Other | Admitting: Podiatry

## 2016-04-29 ENCOUNTER — Encounter: Payer: Self-pay | Admitting: Podiatry

## 2016-04-29 VITALS — BP 153/72 | HR 69 | Resp 18

## 2016-04-29 DIAGNOSIS — B351 Tinea unguium: Secondary | ICD-10-CM

## 2016-04-29 DIAGNOSIS — L97501 Non-pressure chronic ulcer of other part of unspecified foot limited to breakdown of skin: Secondary | ICD-10-CM

## 2016-04-29 DIAGNOSIS — I739 Peripheral vascular disease, unspecified: Secondary | ICD-10-CM

## 2016-04-29 DIAGNOSIS — E1140 Type 2 diabetes mellitus with diabetic neuropathy, unspecified: Secondary | ICD-10-CM

## 2016-04-29 NOTE — Patient Instructions (Signed)
Apply Silvadene cream daily or every other day to the skin ulcer on the bottom of the right foot at the base of the fifth toe, cover with gauze. Wear regular shoe with the padded insole Limits standing and walking If you notice any sudden increase in pain, swelling, redness, fever, drainage present to the emergency department  Diabetes and Foot Care Diabetes may cause you to have problems because of poor blood supply (circulation) to your feet and legs. This may cause the skin on your feet to become thinner, break easier, and heal more slowly. Your skin may become dry, and the skin may peel and crack. You may also have nerve damage in your legs and feet causing decreased feeling in them. You may not notice minor injuries to your feet that could lead to infections or more serious problems. Taking care of your feet is one of the most important things you can do for yourself. Follow these instructions at home:  Wear shoes at all times, even in the house. Do not go barefoot. Bare feet are easily injured.  Check your feet daily for blisters, cuts, and redness. If you cannot see the bottom of your feet, use a mirror or ask someone for help.  Wash your feet with warm water (do not use hot water) and mild soap. Then pat your feet and the areas between your toes until they are completely dry. Do not soak your feet as this can dry your skin.  Apply a moisturizing lotion or petroleum jelly (that does not contain alcohol and is unscented) to the skin on your feet and to dry, brittle toenails. Do not apply lotion between your toes.  Trim your toenails straight across. Do not dig under them or around the cuticle. File the edges of your nails with an emery board or nail file.  Do not cut corns or calluses or try to remove them with medicine.  Wear clean socks or stockings every day. Make sure they are not too tight. Do not wear knee-high stockings since they may decrease blood flow to your legs.  Wear shoes  that fit properly and have enough cushioning. To break in new shoes, wear them for just a few hours a day. This prevents you from injuring your feet. Always look in your shoes before you put them on to be sure there are no objects inside.  Do not cross your legs. This may decrease the blood flow to your feet.  If you find a minor scrape, cut, or break in the skin on your feet, keep it and the skin around it clean and dry. These areas may be cleansed with mild soap and water. Do not cleanse the area with peroxide, alcohol, or iodine.  When you remove an adhesive bandage, be sure not to damage the skin around it.  If you have a wound, look at it several times a day to make sure it is healing.  Do not use heating pads or hot water bottles. They may burn your skin. If you have lost feeling in your feet or legs, you may not know it is happening until it is too late.  Make sure your health care provider performs a complete foot exam at least annually or more often if you have foot problems. Report any cuts, sores, or bruises to your health care provider immediately. Contact a health care provider if:  You have an injury that is not healing.  You have cuts or breaks in the skin.  You have an ingrown nail.  You notice redness on your legs or feet.  You feel burning or tingling in your legs or feet.  You have pain or cramps in your legs and feet.  Your legs or feet are numb.  Your feet always feel cold. Get help right away if:  There is increasing redness, swelling, or pain in or around a wound.  There is a red line that goes up your leg.  Pus is coming from a wound.  You develop a fever or as directed by your health care provider.  You notice a bad smell coming from an ulcer or wound. This information is not intended to replace advice given to you by your health care provider. Make sure you discuss any questions you have with your health care provider. Document Released: 02/07/2000  Document Revised: 07/18/2015 Document Reviewed: 07/19/2012 Elsevier Interactive Patient Education  2017 [employer].

## 2016-04-29 NOTE — Progress Notes (Signed)
Patient ID: [name], female   DOB: 10-Dec-1950, 66 y.o.   MRN: [id number]    Subjective: This patient presents for ongoing maintenance for pre-ulcerative or ulcerative plantar skin lesions on right and left feet.  plantar fifth right MPJ as well as ongoing debridement of pre-ulcerative calluses.Also complaining of thickened uncomfortable toenails right and left feet Patient is a known diabetic with a history of peripheral arterial disease and neuropathy Patient continue to smoke   Objective: Orientated 3 DP pulses 2/4 bilaterally PT pulses trace palpable bilaterally Capillary reflex immediate bilaterally Sensation to 10 g monofilament wire intact 5/5 bilaterally Vibratory sensation reactive bilaterally Ankle reflex equal and reactive bilaterally 5 mm superficial ulcer with granular base fifth right MPJ Pre-ulcerative calluses first MPJ bilaterally and fifth MPJ left Nucleated keratoses left heel Hypertrophic, deformed, discolored toenails 6-10 (last debrided 02/25/2016)  Assessment: Diabetic with peripheral neuropathy and peripheral arterial disease Pre-ulcerative plantar calluses 3 Superficial noninfected ulcer fifth MPJ right Symptomatic mycotic toenails 6-10   Plan: Debride plantar ulcer fifth MPJ right and apply Silvadene dressing Instructed patient apply Silvadene dressing and gauze to the fifth MPJ ulcer right and wear modified insole if the padding to offload fifth MPJ right in athletic style shoe Also attached a thick felt pad on patient's insole on the right shoe, as patient was not wearing the surgical shoe today Debride all pre-ulcerative plantar calluses 3without any bleeding Debrided toenails 6-10 mechanically and electrically without any bleeding  Reappoint 2 weeks

## 2016-04-29 NOTE — Progress Notes (Signed)
   Subjective:    Patient ID: [name], female    DOB: 01/27/1951, 66 y.o.   MRN: [id number]  HPI   I am here to get my calluses trimmed up on both feet    Review of Systems  All other systems reviewed and are negative.      Objective:   Physical Exam        Assessment & Plan:

## 2016-04-30 ENCOUNTER — Encounter: Payer: Self-pay | Admitting: Internal Medicine

## 2016-04-30 ENCOUNTER — Ambulatory Visit (INDEPENDENT_AMBULATORY_CARE_PROVIDER_SITE_OTHER): Payer: Medicaid Other | Admitting: Internal Medicine

## 2016-04-30 VITALS — BP 154/62 | HR 70 | Ht 65.0 in | Wt 140.0 lb

## 2016-04-30 DIAGNOSIS — R109 Unspecified abdominal pain: Secondary | ICD-10-CM | POA: Diagnosis not present

## 2016-04-30 DIAGNOSIS — K5900 Constipation, unspecified: Secondary | ICD-10-CM

## 2016-04-30 DIAGNOSIS — Z1211 Encounter for screening for malignant neoplasm of colon: Secondary | ICD-10-CM | POA: Diagnosis not present

## 2016-04-30 DIAGNOSIS — K8689 Other specified diseases of pancreas: Secondary | ICD-10-CM | POA: Diagnosis not present

## 2016-04-30 DIAGNOSIS — K862 Cyst of pancreas: Secondary | ICD-10-CM

## 2016-04-30 DIAGNOSIS — K863 Pseudocyst of pancreas: Secondary | ICD-10-CM

## 2016-04-30 DIAGNOSIS — F1010 Alcohol abuse, uncomplicated: Secondary | ICD-10-CM

## 2016-04-30 MED ORDER — NA SULFATE-K SULFATE-MG SULF 17.5-3.13-1.6 GM/177ML PO SOLN: 1.0000 | Freq: Once | ORAL | 0 refills | Status: AC

## 2016-04-30 NOTE — Progress Notes (Signed)
HISTORY OF PRESENT ILLNESS:  [name] is a 66 y.o. 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 She was last evaluated in this office 09/24/2015. See that dictation. At that time the impression was chronic constipation, the need for repeat screening colonoscopy, chronic calcific alcoholic pancreatitis with ongoing alcohol consumption and intermittent steatorrhea. Recommendations were MiraLAX, screening colonoscopy, stop drinking, and adjust Creon dosage. The patient did adjust her Creon dosage with resolution of steatorrhea. She has gained a few pounds. She does continue to complain of constipation for which she requests additional assistance. She also reports problems with intermittent lower abdominal pain typically associated with constipation. Relieved with defecation. She is also interested in scheduling her colonoscopy which she canceled last fall. She continues to consume alcohol in the form of beer. Review of outside laboratories from February 2018 reveals mild renal insufficiency and basic metabolic panel.  REVIEW OF SYSTEMS:  All non-GI ROS negative except for arthritis  Past Medical History:  Diagnosis Date  . ANEMIA, CHRONIC DISEASE NEC 03/15/2006  . Constipation due to opioid therapy 06/05/2014  . DEGENERATIVE JOINT DISEASE 05/06/2007   On going for >5 yrs No imaging to confirm Has tried ultram, mobic, neurontin which did not work Ambulance person tried once worked well    . Diabetes mellitus   . DIABETIC  RETINOPATHY 03/15/2006  . 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 Diastolic dysfunction 95/18/8416   Grade 1 by Echocardiogram 12/13/14  . Hemorrhoids   . Hyperlipidemia 04/01/2011  . Hypertension   . Moderate aortic regurgitation 09/25/2008   12/13/14 Echocardiogram    . PANCREATITIS, CHRONIC 03/29/2006  . Peripheral  arterial disease ([place])   . RENAL INSUFFICIENCY, CHRONIC 03/15/2006    Past Surgical History:  Procedure Laterality Date  . BREAST SURGERY    . MOUTH SURGERY N/A     Social History [name]  reports that she has been smoking Cigarettes.  She has been smoking about 0.50 packs per day. She has never used smokeless tobacco. She reports that she does not drink alcohol or use drugs.  family history includes Dementia in her mother.  Allergies  Allergen Reactions  . Gabapentin Swelling    Legs and feet  . Nicorette [Nicotine] Nausea Only       PHYSICAL EXAMINATION: Vital signs: BP (!) 154/62   Pulse 70   Ht 5\' 5"  (1.651 m)   Wt 140 lb (63.5 kg)   BMI 23.30 kg/m   Constitutional: generally well-appearing, no acute distress Psychiatric: alert and oriented x3, cooperative Eyes: extraocular movements intact, anicteric, conjunctiva pink Mouth: oral pharynx moist, no lesions Neck: supple no lymphadenopathy Cardiovascular: heart regular rate and rhythm, no murmur Lungs: clear to auscultation bilaterally Abdomen: soft, nontender, nondistended, no obvious ascites, no peritoneal signs, normal bowel sounds, no organomegaly Rectal:Deferred until colonoscopy Extremities: no clubbing cyanosis or lower extremity edema bilaterally Skin: no lesions on visible extremities Neuro: No focal deficits. Cranial nerves intact. No asterixis.    ASSESSMENT:  #1. Functional constipation. Ongoing #2. Lower abdominal pain likely secondary to functional constipation #3. Colonoscopy 2007 negative for neoplasia. Due for follow-up #4. Chronic calcific pancreatitis with associated steatorrhea secondary to alcohol. Improved with pancreatic enzyme supplements. She continues to drink #5. Chronic alcoholism #6. Multiple medical problems   PLAN:  #1. Told to take MiraLAX regularly and increased dosage as needed to achieve desired result #2.  Advised that her pain medication can cause constipation #3.  Told to stop drinking #4. Continue pancreatic enzyme supplementation with meals #5. Schedule screening colonoscopy.The nature of the procedure, as well as the risks, benefits, and alternatives were carefully and thoroughly reviewed with the patient. Ample time for discussion and questions allowed. The patient understood, was satisfied, and agreed to proceed. #6. Adjust diabetic medications for the procedure to avoid and wanted hypoglycemia #7. Ongoing general medical care with PCP

## 2016-04-30 NOTE — Patient Instructions (Signed)
Increase your Miralax to 2-3 doses per day.  You have been scheduled for a colonoscopy. Please follow written instructions given to you at your visit today.  Please pick up your prep supplies at the pharmacy within the next 1-3 days. If you use inhalers (even only as needed), please bring them with you on the day of your procedure. Your physician has requested that you go to www.startemmi.com and enter the access code given to you at your visit today. This web site gives a general overview about your procedure. However, you should still follow specific instructions given to you by our office regarding your preparation for the procedure.

## 2016-05-12 ENCOUNTER — Other Ambulatory Visit (HOSPITAL_COMMUNITY)
Admission: RE | Admit: 2016-05-12 | Discharge: 2016-05-12 | Disposition: A | Discharge status: Home / Self Care | Payer: Medicaid Other | Source: Ambulatory Visit | Attending: Obstetrics and Gynecology | Admitting: Obstetrics and Gynecology

## 2016-05-12 ENCOUNTER — Encounter: Payer: Self-pay | Admitting: Obstetrics and Gynecology

## 2016-05-12 ENCOUNTER — Ambulatory Visit (INDEPENDENT_AMBULATORY_CARE_PROVIDER_SITE_OTHER): Payer: Medicaid Other | Admitting: Obstetrics and Gynecology

## 2016-05-12 VITALS — BP 139/62 | HR 70 | Ht 65.0 in | Wt 133.6 lb

## 2016-05-12 DIAGNOSIS — N950 Postmenopausal bleeding: Secondary | ICD-10-CM | POA: Insufficient documentation

## 2016-05-12 DIAGNOSIS — Z1151 Encounter for screening for human papillomavirus (HPV): Secondary | ICD-10-CM

## 2016-05-12 DIAGNOSIS — Z124 Encounter for screening for malignant neoplasm of cervix: Secondary | ICD-10-CM

## 2016-05-12 NOTE — Addendum Note (Signed)
Addended by: Shelly Coss on: 05/12/2016 02:06 PM   Modules accepted: Orders

## 2016-05-12 NOTE — Progress Notes (Signed)
66 yo presenting for the evaluation of postmenopausal vaginal bleeding. Patient reports the intermittent spotting in December through February. She describes the bleeding as spotting, dark in color. She reports some mild cramps. She states the bleeding stopped spontaneously. This happened in 2016 and she had a negative endometrial biopsy at that time  Past Medical History:  Diagnosis Date  . ANEMIA, CHRONIC DISEASE NEC 03/15/2006  . Constipation due to opioid therapy 06/05/2014  . DEGENERATIVE JOINT DISEASE 05/06/2007   On going for >5 yrs No imaging to confirm Has tried ultram, mobic, neurontin which did not work Ambulance person tried once worked well    . Diabetes mellitus   . DIABETIC  RETINOPATHY 03/15/2006  . 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 Diastolic dysfunction 16/11/9602   Grade 1 by Echocardiogram 12/13/14  . Hemorrhoids   . Hyperlipidemia 04/01/2011  . Hypertension   . Moderate aortic regurgitation 09/25/2008   12/13/14 Echocardiogram    . PANCREATITIS, CHRONIC 03/29/2006  . Peripheral arterial disease ([place])   . RENAL INSUFFICIENCY, CHRONIC 03/15/2006   Past Surgical History:  Procedure Laterality Date  . BREAST SURGERY    . MOUTH SURGERY N/A    Family History  Problem Relation Age of Onset  . Dementia Mother    Social History  Substance Use Topics  . Smoking status: Current Some Day Smoker    Packs/day: 0.50    Types: Cigarettes  . Smokeless tobacco: Never Used  . Alcohol use No   ROS See pertinent in HPI  Blood pressure 139/62, pulse 70, height 5\' 5"  (1.651 m), weight 133 lb 9.6 oz (60.6 kg), last menstrual period 02/22/2016. GENERAL: Well-developed, well-nourished female in no acute distress.  ABDOMEN: Soft, nontender, nondistended. No organomegaly. PELVIC: Normal external female genitalia. Vagina is pink and rugated.  Normal discharge. Normal appearing cervix. Uterus is normal in size. No adnexal mass or tenderness. EXTREMITIES: No cyanosis, clubbing, or edema, 2+  distal pulses.  03/2016 ultrasound FINDINGS: Evaluation of the adnexa is limited due to bowel gas.  Uterus  Measurements: 7.2 x 4.7 x 5.8 cm. The uterus is retroflexed. The uterus is heterogeneous with multiple fibroids. There is a 1.5 x 1.2 x 1.4 cm and a 1.2 x 0.9 x 1.2 cm posterior body intramural fibroid. A 2.1 x 1.9 x 2.3 cm left anterior body exophytic fibroid noted.  Endometrium  Thickness: There is diffuse thickened appearance of the visualized endometrium measuring 17 mm in thickness. No definite focal abnormal vascularity within the endometrium. In a postmenopausal female findings may represent endometrial hyperplasia, polyp or cancer. Further evaluation with hysteroscopy is recommended.  Right ovary  Not visualized  Left ovary  Not visualized  Other findings  There is moderate amount of free fluid within the pelvis.  IMPRESSION: 1. Heterogeneous uterus with multiple small fibroids. 2. Diffusely thickened endometrium. Hysteroscopy is recommended further evaluation. 3. Nonvisualization of the ovaries. 4. Moderate free fluid within the pelvis of indeterminate etiology. Clinical correlation is recommended.   Electronically Signed   By: Anner Crete M.D.   On: 04/18/2016 02:09  A/P 66 yo with postmenopausal vaginal bleeding - Discussed obtaining an endometrial biopsy ENDOMETRIAL BIOPSY     The indications for endometrial biopsy were reviewed.   Risks of the biopsy including cramping, bleeding, infection, uterine perforation, inadequate specimen and need for additional procedures  were discussed. The patient states she understands and agrees to undergo procedure today. Consent was signed. Time out was performed. Urine HCG was negative. A sterile speculum was placed  in the patient's vagina and the cervix was prepped with Betadine. A single-toothed tenaculum was placed on the anterior lip of the cervix to stabilize it. The uterine cavity was  sounded to a depth of 7 cm using the uterine sound. The 3 mm pipelle was introduced into the endometrial cavity without difficulty, 2 passes were made.  A  small amount of tissue was  sent to pathology. The instruments were removed from the patient's vagina. Minimal bleeding from the cervix was noted. The patient tolerated the procedure well.  Routine post-procedure instructions were given to the patient. The patient will follow up in two weeks to review the results and for further management.   - Explained to the patient that given endometrial thickness, a hysteroscopy would be of benefit if biopsy results are scant and benign. Patient verbalized understanding - Patient will be contacted with any abnormal results

## 2016-05-14 ENCOUNTER — Other Ambulatory Visit: Payer: Self-pay | Admitting: Obstetrics and Gynecology

## 2016-05-14 LAB — CYTOLOGY - PAP
DIAGNOSIS: NEGATIVE
HPV (WINDOPATH): NOT DETECTED

## 2016-05-15 ENCOUNTER — Encounter (HOSPITAL_COMMUNITY): Payer: Self-pay

## 2016-05-15 ENCOUNTER — Telehealth: Payer: Self-pay | Admitting: *Deleted

## 2016-05-15 NOTE — Telephone Encounter (Signed)
-----   Message from Mora Bellman, MD sent at 05/14/2016  4:13 PM EDT ----- Please inform patient of negative endometrial biopsy. As discussed during her visit, I will schedule her for a dilatation and curettage in the operating room in order to get a better sample since this is her second episode of postmenopausal bleeding with neg endometrial biopsy  Thanks  Vickii Chafe

## 2016-05-15 NOTE — Telephone Encounter (Signed)
Spoke with patient and informed her of endo bx result and plans to go forward with D&C. Let patient know she would receive a phone call from scheduling once the procedure is arranged. Patient voiced understanding.

## 2016-05-25 ENCOUNTER — Ambulatory Visit (INDEPENDENT_AMBULATORY_CARE_PROVIDER_SITE_OTHER): Payer: Medicaid Other | Admitting: Podiatry

## 2016-05-25 DIAGNOSIS — I739 Peripheral vascular disease, unspecified: Secondary | ICD-10-CM

## 2016-05-25 DIAGNOSIS — E1140 Type 2 diabetes mellitus with diabetic neuropathy, unspecified: Secondary | ICD-10-CM

## 2016-05-25 DIAGNOSIS — L84 Corns and callosities: Secondary | ICD-10-CM | POA: Diagnosis not present

## 2016-05-25 NOTE — Progress Notes (Signed)
Subjective:     Patient ID: [name], female   DOB: Sep 16, 1950, 66 y.o.   MRN: [id number]  HPI patient is found to have painful plantar lesions on both feet with long-term diabetes with neurological and vascular issues   Review of Systems     Objective:   Physical Exam Poor health individual with significant reduction of vascular and neurological sharp dull and vibratory. Has keratotic lesion sub-15 both feet    Assessment:     Chronic lesion formation secondary to structure    Plan:     Debris lesions with no iatrogenic bleeding and reappoint for routine care or earlier if any issues should occur

## 2016-06-02 ENCOUNTER — Ambulatory Visit: Payer: Medicaid Other

## 2016-06-03 NOTE — Progress Notes (Deleted)
[facility] Subjective:  HPI: [name] is a 66 y.o. female who presents for ***  Need Controlled substance agreement   Review of Systems: *** Objective:  Physical Exam: There were no vitals filed for this visit. *** Assessment & Plan:  No problem-specific Assessment & Plan notes found for this encounter.   Medications Ordered No orders of the defined types were placed in this encounter.  Other Orders No orders of the defined types were placed in this encounter.  Follow Up: No Follow-up on file.

## 2016-06-04 ENCOUNTER — Ambulatory Visit: Payer: Medicaid Other | Admitting: Internal Medicine

## 2016-06-08 ENCOUNTER — Other Ambulatory Visit: Payer: Self-pay

## 2016-06-08 ENCOUNTER — Encounter (HOSPITAL_COMMUNITY): Payer: Self-pay

## 2016-06-08 ENCOUNTER — Encounter (HOSPITAL_COMMUNITY)
Admission: RE | Admit: 2016-06-08 | Discharge: 2016-06-08 | Disposition: A | Discharge status: Home / Self Care | Payer: Medicaid Other | Source: Ambulatory Visit | Attending: Obstetrics and Gynecology | Admitting: Obstetrics and Gynecology

## 2016-06-08 DIAGNOSIS — Z9889 Other specified postprocedural states: Secondary | ICD-10-CM | POA: Insufficient documentation

## 2016-06-08 DIAGNOSIS — I129 Hypertensive chronic kidney disease with stage 1 through stage 4 chronic kidney disease, or unspecified chronic kidney disease: Secondary | ICD-10-CM | POA: Insufficient documentation

## 2016-06-08 DIAGNOSIS — E785 Hyperlipidemia, unspecified: Secondary | ICD-10-CM | POA: Diagnosis not present

## 2016-06-08 DIAGNOSIS — R001 Bradycardia, unspecified: Secondary | ICD-10-CM | POA: Diagnosis not present

## 2016-06-08 DIAGNOSIS — F17210 Nicotine dependence, cigarettes, uncomplicated: Secondary | ICD-10-CM | POA: Insufficient documentation

## 2016-06-08 DIAGNOSIS — E1122 Type 2 diabetes mellitus with diabetic chronic kidney disease: Secondary | ICD-10-CM | POA: Insufficient documentation

## 2016-06-08 DIAGNOSIS — N950 Postmenopausal bleeding: Secondary | ICD-10-CM | POA: Diagnosis not present

## 2016-06-08 DIAGNOSIS — Z01810 Encounter for preprocedural cardiovascular examination: Secondary | ICD-10-CM | POA: Insufficient documentation

## 2016-06-08 DIAGNOSIS — Z01812 Encounter for preprocedural laboratory examination: Secondary | ICD-10-CM | POA: Diagnosis not present

## 2016-06-08 DIAGNOSIS — N189 Chronic kidney disease, unspecified: Secondary | ICD-10-CM | POA: Insufficient documentation

## 2016-06-08 HISTORY — DX: Pneumonia, unspecified organism: J18.9

## 2016-06-08 HISTORY — DX: Cardiac murmur, unspecified: R01.1

## 2016-06-08 HISTORY — DX: Personal history of urinary calculi: Z87.442

## 2016-06-08 LAB — CBC
HCT: 31.8 % — ABNORMAL LOW (ref 36.0–46.0)
Hemoglobin: 10.7 g/dL — ABNORMAL LOW (ref 12.0–15.0)
MCH: 30.4 pg (ref 26.0–34.0)
MCHC: 33.6 g/dL (ref 30.0–36.0)
MCV: 90.3 fL (ref 78.0–100.0)
PLATELETS: 113 10*3/uL — AB (ref 150–400)
RBC: 3.52 MIL/uL — AB (ref 3.87–5.11)
RDW: 14.7 % (ref 11.5–15.5)
WBC: 3.6 10*3/uL — AB (ref 4.0–10.5)

## 2016-06-08 LAB — BASIC METABOLIC PANEL
Anion gap: 8 (ref 5–15)
BUN: 43 mg/dL — AB (ref 6–20)
CALCIUM: 9 mg/dL (ref 8.9–10.3)
CO2: 21 mmol/L — ABNORMAL LOW (ref 22–32)
CREATININE: 1.29 mg/dL — AB (ref 0.44–1.00)
Chloride: 106 mmol/L (ref 101–111)
GFR calc non Af Amer: 43 mL/min — ABNORMAL LOW (ref >60)
GFR, EST AFRICAN AMERICAN: 50 mL/min — AB (ref >60)
Glucose, Bld: 161 mg/dL — ABNORMAL HIGH (ref 65–99)
Potassium: 3.9 mmol/L (ref 3.5–5.1)
SODIUM: 135 mmol/L (ref 135–145)

## 2016-06-08 NOTE — Pre-Procedure Instructions (Signed)
Dr. Elly Modena and Sr. Sabra Heck are ok to proceed with surgery with current platelet count.

## 2016-06-08 NOTE — Patient Instructions (Signed)
Your procedure is scheduled on:  Tuesday, June 16, 2016  Enter through the [employer] of [facility] at:  9:30 AM  Pick up the phone at the desk and dial [phone].  Call this number if you have problems the morning of surgery: [phone].  Remember: Do NOT eat food or drink after:  Midnight Monday  Take these medicines the morning of surgery with a SIP OF WATER:  Atorvastatin, Losartan, Pregabalin  Stop ALL herbal medications at this time  Do NOT smoke the day of surgery.  Do NOT wear jewelry (body piercing), metal hair clips/bobby pins, make-up, or nail polish. Do NOT wear lotions, powders, or perfumes.  You may wear deodorant. Do NOT shave for 48 hours prior to surgery. Do NOT bring valuables to the hospital. Contacts, dentures, or bridgework may not be worn into surgery.  Have a responsible adult drive you home and stay with you for 24 hours after your procedure  Bring a copy of your healthcare power of attorney and living will documents.

## 2016-06-11 ENCOUNTER — Ambulatory Visit (INDEPENDENT_AMBULATORY_CARE_PROVIDER_SITE_OTHER): Payer: Medicaid Other | Admitting: Internal Medicine

## 2016-06-11 ENCOUNTER — Other Ambulatory Visit: Payer: Self-pay | Admitting: *Deleted

## 2016-06-11 ENCOUNTER — Encounter (INDEPENDENT_AMBULATORY_CARE_PROVIDER_SITE_OTHER): Payer: Self-pay

## 2016-06-11 VITALS — BP 149/53 | HR 59 | Temp 98.4°F | Wt 138.0 lb

## 2016-06-11 DIAGNOSIS — I129 Hypertensive chronic kidney disease with stage 1 through stage 4 chronic kidney disease, or unspecified chronic kidney disease: Secondary | ICD-10-CM

## 2016-06-11 DIAGNOSIS — N183 Chronic kidney disease, stage 3 unspecified: Secondary | ICD-10-CM

## 2016-06-11 DIAGNOSIS — Z79891 Long term (current) use of opiate analgesic: Secondary | ICD-10-CM

## 2016-06-11 DIAGNOSIS — E1122 Type 2 diabetes mellitus with diabetic chronic kidney disease: Secondary | ICD-10-CM | POA: Diagnosis present

## 2016-06-11 DIAGNOSIS — Z7984 Long term (current) use of oral hypoglycemic drugs: Secondary | ICD-10-CM

## 2016-06-11 DIAGNOSIS — I10 Essential (primary) hypertension: Secondary | ICD-10-CM

## 2016-06-11 DIAGNOSIS — Z87891 Personal history of nicotine dependence: Secondary | ICD-10-CM

## 2016-06-11 DIAGNOSIS — Z794 Long term (current) use of insulin: Secondary | ICD-10-CM

## 2016-06-11 DIAGNOSIS — N189 Chronic kidney disease, unspecified: Secondary | ICD-10-CM

## 2016-06-11 DIAGNOSIS — Z79899 Other long term (current) drug therapy: Secondary | ICD-10-CM | POA: Diagnosis not present

## 2016-06-11 MED ORDER — AMLODIPINE BESYLATE 5 MG PO TABS: 5.0000 mg | ORAL_TABLET | Freq: Every day | ORAL | 1 refills | Status: DC

## 2016-06-11 NOTE — Telephone Encounter (Signed)
No, I didn't discuss that with her. She said she had a script waiting for her so Ulis Rias and I checked but did not find one. I told her she needed to discuss her pain medication refills with you and make a follow up appointment.

## 2016-06-11 NOTE — Telephone Encounter (Signed)
Last office visit:  06/11/2016 [facility]) Last UDS: 04/09/2016 (Notes Recorded by Lucious Groves, DO on 04/20/2016 at 8:58 AM EST Unexpectedly positive for ETOH and THC and negative for hydrocodone, will discuss at next visit and consider taper) Last Refill: 05/16/2016 #60 (confirmed w/pharmacy) Next appt: none scheduled (will send appt request to front office)

## 2016-06-11 NOTE — Patient Instructions (Signed)
General Instructions: - Start Amlodipine 5 mg daily for your blood pressure - Continue Losartan 100 mg daily - Please bring your glucometer to your next visit - Follow up in 1 month for your diabetes and blood pressure   Please bring your medicines with you each time you come to clinic.  Medicines may include prescription medications, over-the-counter medications, herbal remedies, eye drops, vitamins, or other pills.   Progress Toward Treatment Goals:  Treatment Goal 11/29/2014  Hemoglobin A1C unchanged  Blood pressure deteriorated  Stop smoking smoking the same amount    Self Care Goals & Plans:  Self Care Goal 08/15/2015  Manage my medications take my medicines as prescribed; bring my medications to every visit; refill my medications on time  Monitor my health keep track of my blood glucose; bring my glucose meter and log to each visit  Eat healthy foods drink diet soda or water instead of juice or soda; eat more vegetables; eat foods that are low in salt; eat baked foods instead of fried foods; eat fruit for snacks and desserts  Be physically active find an activity I enjoy; take a walk every day  Stop smoking cut down the number of cigarettes smoked; set a quit date and stop smoking  Meeting treatment goals maintain the current self-care plan    Home Blood Glucose Monitoring 11/29/2014  Check my blood sugar 3 times a day  When to check my blood sugar before meals     Care Management & Community Referrals:  Referral 03/29/2014  Referrals made for care management support none needed

## 2016-06-11 NOTE — Progress Notes (Signed)
   CC: Hypertension follow-up  HPI:  [name] is a 66 y.o. woman with past medical history as noted below who presents today for follow-up of her hypertension.  HTN: BP today is elevated at 149/53. Repeat manual blood pressure was 156/78. She is taking losartan 100 mg daily. She denies any missed doses.  Type 2 diabetes: Last A1c 8.3. She reports taking metformin XR 500 mg daily and sitagliptin 50 mg daily. She did not bring in her glucometer today. She reports her blood sugars have been in the 100-120 range. She only checks in the morning before breakfast. She denies any blurry vision, polydipsia, or polyuria.  Past Medical History:  Diagnosis Date  . ANEMIA, CHRONIC DISEASE NEC 03/15/2006  . Constipation due to opioid therapy 06/05/2014  . DEGENERATIVE JOINT DISEASE 05/06/2007   On going for >5 yrs No imaging to confirm Has tried ultram, mobic, neurontin which did not work Ambulance person tried once worked well    . Diabetes mellitus   . DIABETIC  RETINOPATHY 03/15/2006  . 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 Diastolic dysfunction 81/02/7508   Grade 1 by Echocardiogram 12/13/14  . Heart murmur   . Hemorrhoids   . History of kidney stones   . Hyperlipidemia 04/01/2011  . Hypertension   . Moderate aortic regurgitation 09/25/2008   12/13/14 Echocardiogram    . PANCREATITIS, CHRONIC 03/29/2006  . Peripheral arterial disease ([place])   . Pneumonia    age 35-20  . RENAL INSUFFICIENCY, CHRONIC 03/15/2006    Review of Systems:   All negative except per history of present illness  Physical Exam:  Vitals:   06/11/16 1336  BP: (!) 149/53  Pulse: (!) 59  Temp: 98.4 F (36.9 C)  TempSrc: Oral  SpO2: 100%  Weight: 138 lb (62.6 kg)   General: Thin woman in NAD  CV: RRR, no M/G/R  Pulm: CTA bilaterally, breaths nonlabored Ext: no peripheral edema   Assessment & Plan:   See Encounters Tab for problem based charting.  Patient discussed with Dr. Beryle Beams

## 2016-06-11 NOTE — Telephone Encounter (Signed)
Was the previous negative UDS discussed at today's visit?

## 2016-06-12 ENCOUNTER — Encounter: Payer: Self-pay | Admitting: Internal Medicine

## 2016-06-12 MED ORDER — HYDROCODONE-ACETAMINOPHEN 5-325 MG PO TABS: 1.0000 | ORAL_TABLET | Freq: Four times a day (QID) | ORAL | 0 refills | Status: DC | PRN

## 2016-06-12 NOTE — Progress Notes (Signed)
Medicine attending: Medical history, presenting problems, physical findings, and medications, reviewed with resident physician Dr Carly Rivet on the day of the patient visit and I concur with her evaluation and management plan. 

## 2016-06-12 NOTE — Telephone Encounter (Signed)
Wanting to know if Rx is ready. Please call back.

## 2016-06-12 NOTE — Telephone Encounter (Signed)
I will provide a 2 week prescription, It appears she was instructed to follow up in 2 weeks by Dr Arcelia Jew, at that visit we do need to address her previous abnormal UDS,  I would also like her to provide a urine sample when she comes.

## 2016-06-12 NOTE — Assessment & Plan Note (Signed)
She did not bring in her glucometer today unable to assess her glucose control. She has been compliant with her medications, metformin XR 500 mg daily and sitagliptin 50 mg daily. She was advised to bring her glucometer to her next visit.

## 2016-06-12 NOTE — Assessment & Plan Note (Addendum)
BP has remained elevated over the last few visits. Will have her start amlodipine 5 mg daily and continue losartan 100 mg daily. She will follow up in 2 weeks for blood pressure recheck. We discussed the importance of good blood pressure control given her CKD.

## 2016-06-16 ENCOUNTER — Ambulatory Visit (HOSPITAL_COMMUNITY)
Admission: RE | Admit: 2016-06-16 | Discharge: 2016-06-16 | Disposition: A | Discharge status: Home / Self Care | Payer: Medicaid Other | Source: Ambulatory Visit | Attending: Obstetrics and Gynecology | Admitting: Obstetrics and Gynecology

## 2016-06-16 ENCOUNTER — Ambulatory Visit (HOSPITAL_COMMUNITY): Payer: Medicaid Other | Admitting: Anesthesiology

## 2016-06-16 ENCOUNTER — Encounter (HOSPITAL_COMMUNITY)
Admission: RE | Disposition: A | Discharge status: Home / Self Care | Payer: Self-pay | Source: Ambulatory Visit | Attending: Obstetrics and Gynecology

## 2016-06-16 ENCOUNTER — Encounter (HOSPITAL_COMMUNITY): Payer: Self-pay | Admitting: *Deleted

## 2016-06-16 DIAGNOSIS — F17210 Nicotine dependence, cigarettes, uncomplicated: Secondary | ICD-10-CM | POA: Diagnosis not present

## 2016-06-16 DIAGNOSIS — M1990 Unspecified osteoarthritis, unspecified site: Secondary | ICD-10-CM | POA: Diagnosis not present

## 2016-06-16 DIAGNOSIS — K861 Other chronic pancreatitis: Secondary | ICD-10-CM | POA: Insufficient documentation

## 2016-06-16 DIAGNOSIS — K5903 Drug induced constipation: Secondary | ICD-10-CM | POA: Insufficient documentation

## 2016-06-16 DIAGNOSIS — N189 Chronic kidney disease, unspecified: Secondary | ICD-10-CM | POA: Insufficient documentation

## 2016-06-16 DIAGNOSIS — E1122 Type 2 diabetes mellitus with diabetic chronic kidney disease: Secondary | ICD-10-CM | POA: Diagnosis not present

## 2016-06-16 DIAGNOSIS — N840 Polyp of corpus uteri: Secondary | ICD-10-CM | POA: Diagnosis not present

## 2016-06-16 DIAGNOSIS — N950 Postmenopausal bleeding: Secondary | ICD-10-CM | POA: Diagnosis present

## 2016-06-16 DIAGNOSIS — I129 Hypertensive chronic kidney disease with stage 1 through stage 4 chronic kidney disease, or unspecified chronic kidney disease: Secondary | ICD-10-CM | POA: Insufficient documentation

## 2016-06-16 DIAGNOSIS — E1140 Type 2 diabetes mellitus with diabetic neuropathy, unspecified: Secondary | ICD-10-CM | POA: Insufficient documentation

## 2016-06-16 DIAGNOSIS — N939 Abnormal uterine and vaginal bleeding, unspecified: Secondary | ICD-10-CM | POA: Diagnosis present

## 2016-06-16 DIAGNOSIS — Z7984 Long term (current) use of oral hypoglycemic drugs: Secondary | ICD-10-CM | POA: Insufficient documentation

## 2016-06-16 DIAGNOSIS — T402X5A Adverse effect of other opioids, initial encounter: Secondary | ICD-10-CM | POA: Diagnosis not present

## 2016-06-16 DIAGNOSIS — E785 Hyperlipidemia, unspecified: Secondary | ICD-10-CM | POA: Diagnosis not present

## 2016-06-16 DIAGNOSIS — Z7982 Long term (current) use of aspirin: Secondary | ICD-10-CM | POA: Insufficient documentation

## 2016-06-16 DIAGNOSIS — E1151 Type 2 diabetes mellitus with diabetic peripheral angiopathy without gangrene: Secondary | ICD-10-CM | POA: Insufficient documentation

## 2016-06-16 DIAGNOSIS — Z87442 Personal history of urinary calculi: Secondary | ICD-10-CM | POA: Diagnosis not present

## 2016-06-16 DIAGNOSIS — D631 Anemia in chronic kidney disease: Secondary | ICD-10-CM | POA: Diagnosis not present

## 2016-06-16 DIAGNOSIS — E11319 Type 2 diabetes mellitus with unspecified diabetic retinopathy without macular edema: Secondary | ICD-10-CM | POA: Insufficient documentation

## 2016-06-16 HISTORY — PX: DILATION AND CURETTAGE OF UTERUS: SHX78

## 2016-06-16 HISTORY — PX: POLYPECTOMY: SHX5525

## 2016-06-16 HISTORY — PX: HYSTEROSCOPY: SHX211

## 2016-06-16 LAB — GLUCOSE, CAPILLARY
GLUCOSE-CAPILLARY: 118 mg/dL — AB (ref 65–99)
Glucose-Capillary: 127 mg/dL — ABNORMAL HIGH (ref 65–99)

## 2016-06-16 SURGERY — DILATION AND CURETTAGE
Anesthesia: General

## 2016-06-16 MED ORDER — LIDOCAINE HCL (CARDIAC) 20 MG/ML IV SOLN
INTRAVENOUS | Status: AC
  Filled 2016-06-16: qty 5

## 2016-06-16 MED ORDER — PHENYLEPHRINE 40 MCG/ML (10ML) SYRINGE FOR IV PUSH (FOR BLOOD PRESSURE SUPPORT)
PREFILLED_SYRINGE | INTRAVENOUS | Status: AC
  Filled 2016-06-16: qty 10

## 2016-06-16 MED ORDER — ONDANSETRON HCL 4 MG/2ML IJ SOLN
INTRAMUSCULAR | Status: DC | PRN
  Administered 2016-06-16: 4 mg via INTRAVENOUS

## 2016-06-16 MED ORDER — EPHEDRINE 5 MG/ML INJ
INTRAVENOUS | Status: AC
  Filled 2016-06-16: qty 10

## 2016-06-16 MED ORDER — DOCUSATE SODIUM 100 MG PO CAPS: 100.0000 mg | ORAL_CAPSULE | Freq: Two times a day (BID) | ORAL | 2 refills | Status: DC | PRN

## 2016-06-16 MED ORDER — OXYCODONE-ACETAMINOPHEN 5-325 MG PO TABS
1.0000 | ORAL_TABLET | Freq: Four times a day (QID) | ORAL | 0 refills | Status: DC | PRN
Start: 2016-06-16 — End: 2016-08-07

## 2016-06-16 MED ORDER — PROPOFOL 10 MG/ML IV BOLUS
INTRAVENOUS | Status: DC | PRN
Start: 2016-06-16 — End: 2016-06-16
  Administered 2016-06-16: 150 mg via INTRAVENOUS

## 2016-06-16 MED ORDER — MIDAZOLAM HCL 2 MG/2ML IJ SOLN
INTRAMUSCULAR | Status: AC
  Filled 2016-06-16: qty 2

## 2016-06-16 MED ORDER — SODIUM CHLORIDE 0.9 % IR SOLN
Status: DC | PRN
  Administered 2016-06-16 (×2): 3000 mL

## 2016-06-16 MED ORDER — IBUPROFEN 600 MG PO TABS: 600.0000 mg | ORAL_TABLET | Freq: Four times a day (QID) | ORAL | 3 refills | Status: DC | PRN

## 2016-06-16 MED ORDER — FENTANYL CITRATE (PF) 100 MCG/2ML IJ SOLN
INTRAMUSCULAR | Status: AC
  Filled 2016-06-16: qty 2

## 2016-06-16 MED ORDER — CHLOROPROCAINE HCL 1 % IJ SOLN
INTRAMUSCULAR | Status: AC
  Filled 2016-06-16: qty 30

## 2016-06-16 MED ORDER — CHLOROPROCAINE HCL 1 % IJ SOLN
INTRAMUSCULAR | Status: DC | PRN
  Administered 2016-06-16: 8 mL

## 2016-06-16 MED ORDER — SCOPOLAMINE 1 MG/3DAYS TD PT72
MEDICATED_PATCH | TRANSDERMAL | Status: AC
  Filled 2016-06-16: qty 1

## 2016-06-16 MED ORDER — LACTATED RINGERS IV SOLN
INTRAVENOUS | Status: DC
  Administered 2016-06-16 (×2): via INTRAVENOUS

## 2016-06-16 MED ORDER — ONDANSETRON HCL 4 MG/2ML IJ SOLN
INTRAMUSCULAR | Status: AC
  Filled 2016-06-16: qty 2

## 2016-06-16 MED ORDER — PROPOFOL 10 MG/ML IV BOLUS
INTRAVENOUS | Status: AC
  Filled 2016-06-16: qty 20

## 2016-06-16 MED ORDER — DEXAMETHASONE SODIUM PHOSPHATE 4 MG/ML IJ SOLN
INTRAMUSCULAR | Status: AC
  Filled 2016-06-16: qty 1

## 2016-06-16 MED ORDER — PHENYLEPHRINE HCL 10 MG/ML IJ SOLN
INTRAMUSCULAR | Status: DC | PRN
  Administered 2016-06-16 (×7): 40 ug via INTRAVENOUS

## 2016-06-16 MED ORDER — ROCURONIUM BROMIDE 100 MG/10ML IV SOLN
INTRAVENOUS | Status: AC
  Filled 2016-06-16: qty 1

## 2016-06-16 MED ORDER — LIDOCAINE HCL (CARDIAC) 20 MG/ML IV SOLN
INTRAVENOUS | Status: DC | PRN
  Administered 2016-06-16: 60 mg via INTRAVENOUS

## 2016-06-16 MED ORDER — EPHEDRINE SULFATE 50 MG/ML IJ SOLN
INTRAMUSCULAR | Status: DC | PRN
  Administered 2016-06-16 (×2): 5 mg via INTRAVENOUS

## 2016-06-16 MED ORDER — FENTANYL CITRATE (PF) 100 MCG/2ML IJ SOLN
INTRAMUSCULAR | Status: DC | PRN
  Administered 2016-06-16: 25 ug via INTRAVENOUS
  Administered 2016-06-16: 50 ug via INTRAVENOUS
  Administered 2016-06-16: 25 ug via INTRAVENOUS

## 2016-06-16 SURGICAL SUPPLY — 16 items
CATH ROBINSON RED A/P 16FR (CATHETERS) ×3 IMPLANT
DECANTER SPIKE VIAL GLASS SM (MISCELLANEOUS) ×3 IMPLANT
DEVICE MYOSURE REACH (MISCELLANEOUS) ×2 IMPLANT
GLOVE BIOGEL M 7.0 STRL (GLOVE) ×2 IMPLANT
GLOVE BIOGEL PI IND STRL 6.5 (GLOVE) ×1 IMPLANT
GLOVE BIOGEL PI IND STRL 7.0 (GLOVE) ×1 IMPLANT
GLOVE BIOGEL PI INDICATOR 6.5 (GLOVE) ×2
GLOVE BIOGEL PI INDICATOR 7.0 (GLOVE) ×2
GLOVE SURG SS PI 6.0 STRL IVOR (GLOVE) ×3 IMPLANT
GOWN STRL REUS W/TWL LRG LVL3 (GOWN DISPOSABLE) ×8 IMPLANT
NS IRRIG 1000ML POUR BTL (IV SOLUTION) ×3 IMPLANT
PACK VAGINAL MINOR WOMEN LF (CUSTOM PROCEDURE TRAY) ×3 IMPLANT
PAD OB MATERNITY 4.3X12.25 (PERSONAL CARE ITEMS) ×3 IMPLANT
PAD PREP 24X48 CUFFED NSTRL (MISCELLANEOUS) ×3 IMPLANT
SEAL ROD LENS SCOPE MYOSURE (ABLATOR) ×2 IMPLANT
TOWEL OR 17X24 6PK STRL BLUE (TOWEL DISPOSABLE) ×6 IMPLANT

## 2016-06-16 NOTE — H&P (Signed)
[name] is an 66 y.o. female here for scheduled dilatation and curettage for the evaluation of postmenopausal vaginal bleeding. Patient with a history of intermittent spotting vaginal spotting since December. She reports no vaginal bleeding in March and April. She had similar episodes of vaginal bleeding in 2016 with negative work up. Recent endometrial biopsy in march was negative. Patient here for both therapeutic and diagnostic D&C.  Pertinent Gynecological History: Menses: post-menopausal Bleeding: post menopausal bleeding Contraception: none DES exposure: denies Blood transfusions: none Sexually transmitted diseases: no past history Last pap: normal Date: 04/2016   Menstrual History:  Patient's last menstrual period was 02/22/2016.    Past Medical History:  Diagnosis Date  . ANEMIA, CHRONIC DISEASE NEC 03/15/2006  . Constipation due to opioid therapy 06/05/2014  . DEGENERATIVE JOINT DISEASE 05/06/2007   On going for >5 yrs No imaging to confirm Has tried ultram, mobic, neurontin which did not work Ambulance person tried once worked well    . Diabetes mellitus   . DIABETIC  RETINOPATHY 03/15/2006  . 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 Diastolic dysfunction 28/63/8177   Grade 1 by Echocardiogram 12/13/14  . Heart murmur   . Hemorrhoids   . History of kidney stones   . Hyperlipidemia 04/01/2011  . Hypertension   . Moderate aortic regurgitation 09/25/2008   12/13/14 Echocardiogram    . PANCREATITIS, CHRONIC 03/29/2006  . Peripheral arterial disease ([place])   . Pneumonia    age [age]  . RENAL INSUFFICIENCY, CHRONIC 03/15/2006    Past Surgical History:  Procedure Laterality Date  . BREAST SURGERY    . COLONOSCOPY    . MOUTH SURGERY N/A     Family History  Problem Relation Age of Onset  . Dementia Mother     Social History:  reports that she has been smoking Cigarettes.  She has a 22.00 pack-year smoking history. She has never used smokeless tobacco. She reports that she  drinks alcohol. She reports that she does not use drugs.  Allergies:  Allergies  Allergen Reactions  . Gabapentin Swelling    Legs and feet  . Nicorette [Nicotine] Nausea Only    Prescriptions Prior to Admission  Medication Sig Dispense Refill Last Dose  . ACCU-CHEK AVIVA PLUS test strip TEST three times a day 100 each 11 06/15/2016 at Unknown time  . acetaminophen (TYLENOL) 500 MG tablet Take 1,000 mg by mouth every 6 (six) hours as needed (pain).   06/15/2016 at Unknown time  . amLODipine (NORVASC) 5 MG tablet Take 1 tablet (5 mg total) by mouth daily. 30 tablet 1 06/15/2016 at Unknown time  . aspirin EC 81 MG tablet Take 1 tablet (81 mg total) by mouth daily.   06/16/2016 at 0700  . atorvastatin (LIPITOR) 40 MG tablet Take 1 tablet (40 mg total) by mouth daily. 90 tablet 3 06/15/2016 at Unknown time  . Blood Glucose Monitoring Suppl (ACCU-CHEK AVIVA PLUS) w/Device KIT Use to check you blood sugar 3 times a day 1 kit 1 06/15/2016 at Unknown time  . HYDROcodone-acetaminophen (NORCO) 5-325 MG tablet Take 1 tablet by mouth every 6 (six) hours as needed for moderate pain. 30 tablet 0 06/15/2016 at Unknown time  . losartan (COZAAR) 100 MG tablet Take 1 tablet (100 mg total) by mouth daily. 90 tablet 1 06/16/2016 at 0700  . metFORMIN (GLUCOPHAGE-XR) 500 MG 24 hr tablet Take 1 tablet (500 mg total) by mouth daily with breakfast. 90 tablet 3 06/15/2016 at Unknown time  . Multiple Vitamin (MULTIVITAMIN  WITH MINERALS) TABS tablet Take 1 tablet by mouth daily. One-a-Day Women's 50+   06/15/2016 at Unknown time  . Pancrelipase, Lip-Prot-Amyl, 24000-76000 units CPEP Take 4 capsules (96,000 Units total) by mouth See admin instructions. Take 4 capsules by mouth 2-3 times daily before meals (Patient taking differently: Take 72,000-96,000 Units by mouth See admin instructions. Take 4 capsules by mouth three times daily with meals & 3 capsules with snacks) 1170 capsule 3 06/15/2016 at Unknown time  . polyethylene glycol  (MIRALAX / GLYCOLAX) packet Take 17 g by mouth daily as needed for mild constipation. 14 each 3 Taking  . pregabalin (LYRICA) 75 MG capsule Take 1 capsule (75 mg total) by mouth 2 (two) times daily. (Patient taking differently: Take 75 mg by mouth daily. ) 60 capsule 5 06/16/2016 at 0700  . promethazine (PHENERGAN) 12.5 MG tablet Take 1 tablet (12.5 mg total) by mouth every 6 (six) hours as needed for nausea or vomiting. (Patient taking differently: Take 12.5 mg by mouth every 6 (six) hours as needed (for nausea associate with taking hydrocodone.). ) 60 tablet 2 06/15/2016 at Unknown time  . silver sulfADIAZINE (SILVADENE) 1 % cream Apply 1 application topically daily. (Patient taking differently: Apply 1 application topically daily as needed. Applied to patient feet for wounds) 50 g 0 06/15/2016 at Unknown time  . sitaGLIPtin (JANUVIA) 50 MG tablet Take 1 tablet (50 mg total) by mouth daily. 90 tablet 1 06/15/2016 at Unknown time  . ibuprofen (ADVIL,MOTRIN) 600 MG tablet Take 1 tablet (600 mg total) by mouth 3 (three) times daily. (Patient not taking: Reported on 06/03/2016) 30 tablet 0 Not Taking at Unknown time    ROS See pertinent in HPI Blood pressure (!) 133/57, pulse (!) 59, temperature 98 F (36.7 C), temperature source Oral, resp. rate 16, height 5' 5"  (1.651 m), weight 138 lb (62.6 kg), last menstrual period 02/22/2016, SpO2 99 %. Physical Exam GENERAL: Well-developed, well-nourished female in no acute distress.  HEENT: Normocephalic, atraumatic. Sclerae anicteric.  NECK: Supple. Normal thyroid.  LUNGS: Clear to auscultation bilaterally.  HEART: Regular rate and rhythm. ABDOMEN: Soft, nontender, nondistended. No organomegaly. PELVIC: Deferred to OR. EXTREMITIES: No cyanosis, clubbing, or edema, 2+ distal pulses.  No results found for this or any previous visit (from the past 24 hour(s)).  No results found.   03/2016 ultrasound FINDINGS: Evaluation of the adnexa is limited due to bowel  gas.  Uterus  Measurements: 7.2 x 4.7 x 5.8 cm. The uterus is retroflexed. The uterus is heterogeneous with multiple fibroids. There is a 1.5 x 1.2 x 1.4 cm and a 1.2 x 0.9 x 1.2 cm posterior body intramural fibroid. A 2.1 x 1.9 x 2.3 cm left anterior body exophytic fibroid noted.  Endometrium  Thickness: There is diffuse thickened appearance of the visualized endometrium measuring 17 mm in thickness. No definite focal abnormal vascularity within the endometrium. In a postmenopausal female findings may represent endometrial hyperplasia, polyp or cancer. Further evaluation with hysteroscopy is recommended.  Right ovary  Not visualized  Left ovary  Not visualized  Other findings  There is moderate amount of free fluid within the pelvis.  IMPRESSION: 1. Heterogeneous uterus with multiple small fibroids. 2. Diffusely thickened endometrium. Hysteroscopy is recommended further evaluation. 3. Nonvisualization of the ovaries. 4. Moderate free fluid within the pelvis of indeterminate etiology. Clinical correlation is recommended.   Electronically Signed By: Anner Crete M.D. On: 04/18/2016 02:09  05/12/2016 Endometrial biospy - Negative for hyperplasia or malignacy  Assessment/Plan: 65  yo with postmenopausal vaginal bleeding here for D&C with hysteroscopy - Risks, benefits and alternatives were explained including but not limited to risks of bleeding, infection, uterine perforation, and injury to adjacent organs. Patient verbalized understanding and all questions were answered  Kaylena Pacifico 06/16/2016, 10:03 AM

## 2016-06-16 NOTE — Transfer of Care (Signed)
Immediate Anesthesia Transfer of Care Note  Patient: [name]  Procedure(s) Performed: Procedure(s): DILATATION AND CURETTAGE (N/A) HYSTEROSCOPY (N/A) POLYPECTOMY (N/A)  Patient Location: PACU  Anesthesia Type:General  Level of Consciousness: awake, alert  and patient cooperative  Airway & Oxygen Therapy: Patient Spontanous Breathing and Patient connected to nasal cannula oxygen  Post-op Assessment: Report given to RN and Post -op Vital signs reviewed and stable  Post vital signs: Reviewed and stable  Last Vitals:  Vitals:   06/16/16 0929  BP: (!) 133/57  Pulse: (!) 59  Resp: 16  Temp: 36.7 C    Last Pain:  Vitals:   06/16/16 0929  TempSrc: Oral  PainSc: 8       Patients Stated Pain Goal: 3 (35/67/01 4103)  Complications: No apparent anesthesia complications

## 2016-06-16 NOTE — Anesthesia Preprocedure Evaluation (Addendum)
Anesthesia Evaluation  Patient identified by MRN, date of birth, ID band Patient awake    Reviewed: Allergy & Precautions, NPO status , Patient's Chart, lab work & pertinent test results, reviewed documented beta blocker date and time   Airway Mallampati: II  TM Distance: >3 FB Neck ROM: Full    Dental  (+) Edentulous Lower, Edentulous Upper   Pulmonary pneumonia, resolved, Current Smoker,    Pulmonary exam normal breath sounds clear to auscultation       Cardiovascular hypertension, Pt. on medications + Peripheral Vascular Disease  Normal cardiovascular exam+ Valvular Problems/Murmurs AI  Rhythm:Regular Rate:Normal     Neuro/Psych Diabetic neuropathy Diabetic retinopathy Bilateral Open angle glaucoma    GI/Hepatic Neg liver ROS, GERD  Medicated and Controlled,Chronic pancreatitis   Endo/Other  diabetes, Poorly Controlled, Type 2, Oral Hypoglycemic AgentsHyperlipidemia Osteoporosis  Renal/GU Renal InsufficiencyRenal diseaseHx/o renal calculi     Musculoskeletal  (+) Arthritis , Osteoarthritis,    Abdominal (+) - obese,   Peds  Hematology  (+) anemia , Anemia of chronic disease Thrombocytopenia   Anesthesia Other Findings   Reproductive/Obstetrics PMB                           Lab Results  Component Value Date   WBC 3.6 (L) 06/08/2016   HGB 10.7 (L) 06/08/2016   HCT 31.8 (L) 06/08/2016   MCV 90.3 06/08/2016   PLT 113 (L) 06/08/2016     Chemistry      Component Value Date/Time   NA 135 06/08/2016 1055   NA 140 04/09/2016 0932   K 3.9 06/08/2016 1055   CL 106 06/08/2016 1055   CO2 21 (L) 06/08/2016 1055   BUN 43 (H) 06/08/2016 1055   BUN 36 (H) 04/09/2016 0932   CREATININE 1.29 (H) 06/08/2016 1055   CREATININE 1.35 (H) 05/03/2014 1522      Component Value Date/Time   CALCIUM 9.0 06/08/2016 1055   ALKPHOS 69 11/05/2014 2355   AST 39 11/05/2014 2355   ALT 36 11/05/2014 2355    BILITOT 0.4 11/05/2014 2355     EKG: normal sinus rhythm, nonspecific ST and T waves changes. Echo 12/13/2014 :  Left ventricle: The cavity size was normal. Wall thickness was   increased in a pattern of severe LVH. Systolic function was   vigorous. The estimated ejection fraction was in the range of 65%   to 70%. Wall motion was normal; there were no regional wall   motion abnormalities. Doppler parameters are consistent with   abnormal left ventricular relaxation (grade 1 diastolic   dysfunction). - Aortic valve: There was moderate regurgitation. Anesthesia Physical Anesthesia Plan  ASA: III  Anesthesia Plan: General   Post-op Pain Management:    Induction: Intravenous  Airway Management Planned: LMA  Additional Equipment:   Intra-op Plan:   Post-operative Plan: Extubation in OR  Informed Consent: I have reviewed the patients History and Physical, chart, labs and discussed the procedure including the risks, benefits and alternatives for the proposed anesthesia with the patient or authorized representative who has indicated his/her understanding and acceptance.   Dental advisory given  Plan Discussed with: Anesthesiologist, CRNA and Surgeon  Anesthesia Plan Comments:         Anesthesia Quick Evaluation

## 2016-06-16 NOTE — Anesthesia Postprocedure Evaluation (Signed)
Anesthesia Post Note  Patient: Mikesha C Signore  Procedure(s) Performed: Procedure(s) (LRB): DILATATION AND CURETTAGE (N/A) HYSTEROSCOPY (N/A) POLYPECTOMY (N/A)  Patient location during evaluation: PACU Anesthesia Type: General Level of consciousness: awake and alert and oriented Pain management: pain level controlled Vital Signs Assessment: post-procedure vital signs reviewed and stable Respiratory status: spontaneous breathing, nonlabored ventilation and respiratory function stable Cardiovascular status: blood pressure returned to baseline and stable Postop Assessment: no signs of nausea or vomiting Anesthetic complications: no        Last Vitals:  Vitals:   06/16/16 1230 06/16/16 1245  BP: 122/60 (!) 125/58  Pulse: 71 65  Resp: 15 14  Temp:      Last Pain:  Vitals:   06/16/16 1215  TempSrc:   PainSc: 0-No pain   Pain Goal: Patients Stated Pain Goal: 3 (06/16/16 0929)               Abdoul Encinas A.

## 2016-06-16 NOTE — Op Note (Signed)
PREOPERATIVE DIAGNOSIS:  Abnormal uterine bleeding. POSTOPERATIVE DIAGNOSIS: The same PROCEDURE: Hysteroscopy, Dilation and Curettage, polypectomy using Myosure SURGEON:  Dr. Mora Bellman   INDICATIONS: 66 y.o.  here for scheduled dilatation and curettage with hysteroscopy due to postmenopausal vaginal bleeding. Risks of surgery were discussed with the patient including but not limited to: bleeding which may require transfusion; infection which may require antibiotics; injury to uterus or surrounding organs; intrauterine scarring which may impair future fertility; need for additional procedures including laparotomy or laparoscopy; and other postoperative/anesthesia complications. Written informed consent was obtained.    FINDINGS:  An 8- week size uterus.  Diffuse proliferative endometrium with a large polyp filling the endometrial cavity and extending to the internal os.  Normal ostia bilaterally.  ANESTHESIA:   General, paracervical block. INTRAVENOUS FLUIDS:  1000 ml of LR FLUID DEFICITS:  1100 ml of normal saline ESTIMATED BLOOD LOSS:  Less than 20 ml SPECIMENS: Endometrial curettings and polyp sent to pathology COMPLICATIONS:  None immediate.  PROCEDURE DETAILS:  The patient received intravenous antibiotics while in the preoperative area.  She was then taken to the operating room where general anesthesia was administered and was found to be adequate.  After an adequate timeout was performed, she was placed in the dorsal lithotomy position and examined; then prepped and draped in the sterile manner.   Her bladder was catheterized for an unmeasured amount of clear, yellow urine. A speculum was then placed in the patient's vagina and a single tooth tenaculum was applied to the anterior lip of the cervix.   A paracervical block using 8 ml of 0.5% Marcaine was administered.  The cervix was sounded to 8 cm and dilated manually with Hagar dilators to accommodate the 5 mm diagnostic hysteroscope.   Once the cervix was dilated, the hysteroscope was inserted under direct visualization using sodium chloride as a suspension medium.  The uterine cavity was carefully examined, both ostia were recognized, and diffusely proliferative endometrium was noted along with a large polyp.   After further careful visualization of the uterine cavity, the hysteroscope was removed under direct visualization.  A sharp curettage was then performed to obtain a moderate amount of endometrial curettings. Polypectomy was then performed using the Myosure. The tenaculum was removed from the anterior lip of the cervix and the vaginal speculum was removed after noting good hemostasis.  The patient tolerated the procedure well and was taken to the recovery area awake, extubated and in stable condition.

## 2016-06-16 NOTE — Discharge Instructions (Signed)
Post Anesthesia Home Care Instructions  Activity: Get plenty of rest for the remainder of the day. A responsible individual must stay with you for 24 hours following the procedure.  For the next 24 hours, DO NOT: -Drive a car -Paediatric nurse -Drink alcoholic beverages -Take any medication unless instructed by your physician -Make any legal decisions or sign important papers.  Meals: Start with liquid foods such as gelatin or soup. Progress to regular foods as tolerated. Avoid greasy, spicy, heavy foods. If nausea and/or vomiting occur, drink only clear liquids until the nausea and/or vomiting subsides. Call your physician if vomiting continues.  Special Instructions/Symptoms: Your throat may feel dry or sore from the anesthesia or the breathing tube placed in your throat during surgery. If this causes discomfort, gargle with warm salt water. The discomfort should disappear within 24 hours.  If you had a scopolamine patch placed behind your ear for the management of post- operative nausea and/or vomiting:  1. The medication in the patch is effective for 72 hours, after which it should be removed.  Wrap patch in a tissue and discard in the trash. Wash hands thoroughly with soap and water. 2. You may remove the patch earlier than 72 hours if you experience unpleasant side effects which may include dry mouth, dizziness or visual disturbances. 3. Avoid touching the patch. Wash your hands with soap and water after contact with the patch.    Post Anesthesia Home Care Instructions  Activity: Get plenty of rest for the remainder of the day. A responsible individual must stay with you for 24 hours following the procedure.  For the next 24 hours, DO NOT: -Drive a car -Paediatric nurse -Drink alcoholic beverages -Take any medication unless instructed by your physician -Make any legal decisions or sign important papers.  Meals: Start with liquid foods such as gelatin or soup. Progress to  regular foods as tolerated. Avoid greasy, spicy, heavy foods. If nausea and/or vomiting occur, drink only clear liquids until the nausea and/or vomiting subsides. Call your physician if vomiting continues.  Special Instructions/Symptoms: Your throat may feel dry or sore from the anesthesia or the breathing tube placed in your throat during surgery. If this causes discomfort, gargle with warm salt water. The discomfort should disappear within 24 hours.  If you had a scopolamine patch placed behind your ear for the management of post- operative nausea and/or vomiting:  1. The medication in the patch is effective for 72 hours, after which it should be removed.  Wrap patch in a tissue and discard in the trash. Wash hands thoroughly with soap and water. 2. You may remove the patch earlier than 72 hours if you experience unpleasant side effects which may include dry mouth, dizziness or visual disturbances. 3. Avoid touching the patch. Wash your hands with soap and water after contact with the patch.   Dilation and Curettage or Vacuum Curettage, Care After This sheet gives you information about how to care for yourself after your procedure. Your health care provider may also give you more specific instructions. If you have problems or questions, contact your health care provider. What can I expect after the procedure? After your procedure, it is common to have:  Mild pain or cramping.  Some vaginal bleeding or spotting. These may last for up to 2 weeks after your procedure. Follow these instructions at home: Activity    Do not drive or use heavy machinery while taking prescription pain medicine.  Avoid driving for the first 24 hours  after your procedure.  Take frequent, short walks, followed by rest periods, throughout the day. Ask your health care provider what activities are safe for you. After 1-2 days, you may be able to return to your normal activities.  Do not lift anything heavier than  10 lb (4.5 kg) until your health care provider approves.  For at least 2 weeks, or as long as told by your health care provider, do not:  [place].  Use tampons.  Have sexual intercourse. General instructions    Take over-the-counter and prescription medicines only as told by your health care provider. This is especially important if you take blood thinning medicine.  Do not take baths, swim, or use a hot tub until your health care provider approves. Take showers instead of baths.  Wear compression stockings as told by your health care provider. These stockings help to prevent blood clots and reduce swelling in your legs.  It is your responsibility to get the results of your procedure. Ask your health care provider, or the department performing the procedure, when your results will be ready.  Keep all follow-up visits as told by your health care provider. This is important. Contact a health care provider if:  You have severe cramps that get worse or that do not get better with medicine.  You have severe abdominal pain.  You cannot drink fluids without vomiting.  You develop pain in a different area of your pelvis.  You have bad-smelling vaginal discharge.  You have a rash. Get help right away if:  You have vaginal bleeding that soaks more than one sanitary pad in 1 hour, for 2 hours in a row.  You pass large blood clots from your vagina.  You have a fever that is above 100.37F (38.0C).  Your abdomen feels very tender or hard.  You have chest pain.  You have shortness of breath.  You cough up blood.  You feel dizzy or light-headed.  You faint.  You have pain in your neck or shoulder area. This information is not intended to replace advice given to you by your health care provider. Make sure you discuss any questions you have with your health care provider. Document Released: 02/07/2000 Document Revised: 10/09/2015 Document Reviewed: 09/12/2015 Elsevier  Interactive Patient Education  2017 [employer].

## 2016-06-17 ENCOUNTER — Encounter (HOSPITAL_COMMUNITY): Payer: Self-pay | Admitting: Obstetrics and Gynecology

## 2016-06-17 ENCOUNTER — Telehealth: Payer: Self-pay

## 2016-06-17 NOTE — Telephone Encounter (Signed)
Returned call to patient no answer  no voicemail set up

## 2016-06-17 NOTE — Telephone Encounter (Signed)
Please call pt back about meds.  

## 2016-06-18 ENCOUNTER — Other Ambulatory Visit: Payer: Self-pay

## 2016-06-18 NOTE — Telephone Encounter (Signed)
Called doriss. And had her arrange schedule

## 2016-06-18 NOTE — Telephone Encounter (Addendum)
Call made to pt- appt scheduled with  Dr Heber [place] on 06/25/16 @ 8:15am to discuss medication refill.   Pt states she just had a procedure 2 days ago and is scheduled for a colonoscopy on 06/24/2016, but she will contact transportation to make arrangements to come.  Pt instructed to call the office and reschedule if she is unable to come, phone call complete.Despina Hidden Cassady4/26/20181:32 PM

## 2016-06-18 NOTE — Telephone Encounter (Signed)
Patient calling about refill on Lyrica and Oxycodone Last visit: 06/11/2016 Last UDS: 06/12/2016 Next appointment: to be scheduled around 06/25/2016 Last written: Oxtyodone  06/16/2016 Lyrica 12/11/15

## 2016-06-18 NOTE — Telephone Encounter (Signed)
Dr. Joni Reining will not have any appts until his July schedule becomes available.

## 2016-06-18 NOTE — Telephone Encounter (Signed)
Could we double book my 815 appointment next Thursday to see her, she has multiple red flags for this, including that she is requesting oxycodone what was prescribed by OBGYN and filled just 2 days ago and her last UDS was inappropriate. Unfortunately she canceled her appointment the day prior at her last scheduled appointment earlier this month

## 2016-06-19 ENCOUNTER — Telehealth: Payer: Self-pay | Admitting: Internal Medicine

## 2016-06-19 NOTE — Telephone Encounter (Signed)
Should be fine for colonoscopy. However, She may want to check with her GYN if she believes the bleeding is more excessive than expected

## 2016-06-19 NOTE — Telephone Encounter (Signed)
Pt had a D&C done 06/16/16 and reports she is still bleeding. Pt wanted to make sure she is still ok to have colon done next week. Please advise.

## 2016-06-19 NOTE — Telephone Encounter (Signed)
Attempted to reach pt by phone but no answer. Will try again later.  Spoke with pt and she is aware.

## 2016-06-23 ENCOUNTER — Ambulatory Visit (INDEPENDENT_AMBULATORY_CARE_PROVIDER_SITE_OTHER): Payer: Medicaid Other | Admitting: Podiatry

## 2016-06-23 ENCOUNTER — Encounter: Payer: Self-pay | Admitting: Podiatry

## 2016-06-23 VITALS — BP 128/63 | HR 76

## 2016-06-23 DIAGNOSIS — E1140 Type 2 diabetes mellitus with diabetic neuropathy, unspecified: Secondary | ICD-10-CM

## 2016-06-23 DIAGNOSIS — I739 Peripheral vascular disease, unspecified: Secondary | ICD-10-CM | POA: Diagnosis not present

## 2016-06-23 DIAGNOSIS — L97501 Non-pressure chronic ulcer of other part of unspecified foot limited to breakdown of skin: Secondary | ICD-10-CM | POA: Diagnosis not present

## 2016-06-23 NOTE — Progress Notes (Signed)
Patient ID: [name], female   DOB: 24-Apr-1950, 66 y.o.   MRN: [id number]     Subjective: This patient presents for ongoing maintenance for pre-ulcerative or ulcerative plantar skin lesions on right and left feet.  plantar fifth right MPJ as well as ongoing debridement of pre-ulcerative calluses.Also complaining of thickened uncomfortable toenails right and left feet Patient is a known diabetic with a history of peripheral arterial disease and neuropathy Patient continue to smoke   Objective: Orientated 3 DP pulses 2/4 bilaterally PT pulses trace palpable bilaterally Capillary reflex immediate bilaterally Sensation to 10 g monofilament wire intact 5/5 bilaterally Vibratory sensation reactive bilaterally Ankle reflex equal and reactive bilaterally 5 mm superficial ulcer with granular base fifth right MPJ 2 mm superficial ulcer plantar left first MPJ bilaterally and fifth MPJ left Nucleated keratoses left heel Hypertrophic, deformed, discolored toenails 6-10 (last debrided 02/25/2016)  Assessment: Diabetic with peripheral neuropathy and peripheral arterial disease Pre-ulcerative plantar calluses 3 Superficial noninfected ulcer fifth MPJ right Symptomatic mycotic toenails 6-10   Plan: Debride plantar ulcer fifth MPJ right and plantar left first MPJ and apply Silvadene dressing Instructed patient apply Silvadene dressing and gauze to the fifth MPJ ulcer righ and left first MPJ t and wear modified insole if the padding to offload fifth MPJ right in athletic style shoe Also attached a thick felt pad on patient's insole on the right shoe, as patient was not wearing the surgical shoe today Debride all pre-ulcerative plantar calluses 3without any bleeding Debrided toenails 6-10 mechanically and electrically without any bleeding  Reappoint 2 weeks

## 2016-06-23 NOTE — Patient Instructions (Signed)
Apply Silvadene cream to the skin ulcer at the base of the fifth right toe and the bottom of the left great toe area daily and cover with gauze Wear the surgical shoe on right foot  Diabetes and Foot Care Diabetes may cause you to have problems because of poor blood supply (circulation) to your feet and legs. This may cause the skin on your feet to become thinner, break easier, and heal more slowly. Your skin may become dry, and the skin may peel and crack. You may also have nerve damage in your legs and feet causing decreased feeling in them. You may not notice minor injuries to your feet that could lead to infections or more serious problems. Taking care of your feet is one of the most important things you can do for yourself. Follow these instructions at home:  Wear shoes at all times, even in the house. Do not go barefoot. Bare feet are easily injured.  Check your feet daily for blisters, cuts, and redness. If you cannot see the bottom of your feet, use a mirror or ask someone for help.  Wash your feet with warm water (do not use hot water) and mild soap. Then pat your feet and the areas between your toes until they are completely dry. Do not soak your feet as this can dry your skin.  Apply a moisturizing lotion or petroleum jelly (that does not contain alcohol and is unscented) to the skin on your feet and to dry, brittle toenails. Do not apply lotion between your toes.  Trim your toenails straight across. Do not dig under them or around the cuticle. File the edges of your nails with an emery board or nail file.  Do not cut corns or calluses or try to remove them with medicine.  Wear clean socks or stockings every day. Make sure they are not too tight. Do not wear knee-high stockings since they may decrease blood flow to your legs.  Wear shoes that fit properly and have enough cushioning. To break in new shoes, wear them for just a few hours a day. This prevents you from injuring your feet.  Always look in your shoes before you put them on to be sure there are no objects inside.  Do not cross your legs. This may decrease the blood flow to your feet.  If you find a minor scrape, cut, or break in the skin on your feet, keep it and the skin around it clean and dry. These areas may be cleansed with mild soap and water. Do not cleanse the area with peroxide, alcohol, or iodine.  When you remove an adhesive bandage, be sure not to damage the skin around it.  If you have a wound, look at it several times a day to make sure it is healing.  Do not use heating pads or hot water bottles. They may burn your skin. If you have lost feeling in your feet or legs, you may not know it is happening until it is too late.  Make sure your health care provider performs a complete foot exam at least annually or more often if you have foot problems. Report any cuts, sores, or bruises to your health care provider immediately. Contact a health care provider if:  You have an injury that is not healing.  You have cuts or breaks in the skin.  You have an ingrown nail.  You notice redness on your legs or feet.  You feel burning or tingling in  your legs or feet.  You have pain or cramps in your legs and feet.  Your legs or feet are numb.  Your feet always feel cold. Get help right away if:  There is increasing redness, swelling, or pain in or around a wound.  There is a red line that goes up your leg.  Pus is coming from a wound.  You develop a fever or as directed by your health care provider.  You notice a bad smell coming from an ulcer or wound. This information is not intended to replace advice given to you by your health care provider. Make sure you discuss any questions you have with your health care provider. Document Released: 02/07/2000 Document Revised: 07/18/2015 Document Reviewed: 07/19/2012 Elsevier Interactive Patient Education  2017 [employer].

## 2016-06-24 ENCOUNTER — Encounter: Payer: Self-pay | Admitting: Internal Medicine

## 2016-06-24 ENCOUNTER — Ambulatory Visit (AMBULATORY_SURGERY_CENTER): Payer: Medicaid Other | Admitting: Internal Medicine

## 2016-06-24 ENCOUNTER — Telehealth: Payer: Self-pay | Admitting: Internal Medicine

## 2016-06-24 VITALS — BP 146/62 | HR 67 | Temp 97.7°F | Resp 21 | Ht 65.0 in | Wt 140.0 lb

## 2016-06-24 DIAGNOSIS — Z1211 Encounter for screening for malignant neoplasm of colon: Secondary | ICD-10-CM

## 2016-06-24 DIAGNOSIS — Z1212 Encounter for screening for malignant neoplasm of rectum: Secondary | ICD-10-CM

## 2016-06-24 DIAGNOSIS — D122 Benign neoplasm of ascending colon: Secondary | ICD-10-CM

## 2016-06-24 MED ORDER — SODIUM CHLORIDE 0.9 % IV SOLN: 500.0000 mL | INTRAVENOUS | Status: DC

## 2016-06-24 NOTE — Progress Notes (Signed)
Pt's states no medical or surgical changes since previsit or office visit. 

## 2016-06-24 NOTE — Op Note (Signed)
[facility] Patient Name: [name] Procedure Date: 06/24/2016 2:42 PM MRN: [id number] Endoscopist: Docia Chuck. Henrene Pastor , MD Age: 66 Referring MD:  Date of Birth: 1950/03/20 Gender: Female Account #: [id number] Procedure:                Colonoscopy with snare polypectomy x 1 Indications:              Screening for colorectal malignant neoplasm. Index                            exam 2017 negative for neoplasia Medicines:                Monitored Anesthesia Care Procedure:                Pre-Anesthesia Assessment:                           - Prior to the procedure, a History and Physical                            was performed, and patient medications and                            allergies were reviewed. The patient's tolerance of                            previous anesthesia was also reviewed. The risks                            and benefits of the procedure and the sedation                            options and risks were discussed with the patient.                            All questions were answered, and informed consent                            was obtained. Prior Anticoagulants: The patient has                            taken no previous anticoagulant or antiplatelet                            agents. ASA Grade Assessment: II - A patient with                            mild systemic disease. After reviewing the risks                            and benefits, the patient was deemed in                            satisfactory condition to undergo the procedure.  After obtaining informed consent, the colonoscope                            was passed under direct vision. Throughout the                            procedure, the patient's blood pressure, pulse, and                            oxygen saturations were monitored continuously. The                            Colonoscope was introduced through the anus and   advanced to the the cecum, identified by                            appendiceal orifice and ileocecal valve. The                            ileocecal valve, appendiceal orifice, and rectum                            were photographed. The quality of the bowel                            preparation was adequate to identify polyps. The                            colonoscopy was performed without difficulty. The                            patient tolerated the procedure well. The bowel                            preparation used was SUPREP. Scope In: 2:49:15 PM Scope Out: 3:09:25 PM Scope Withdrawal Time: 0 hours 12 minutes 25 seconds  Total Procedure Duration: 0 hours 20 minutes 10 seconds  Findings:                 NOTE: The examination was made more challenging by                            the inability to retain air and significant spasm.                           -A 4 mm polyp was found in the ascending colon. The                            polyp was removed with a cold snare. Resection and                            retrieval were complete.                           Multiple  small-mouthed diverticula were found in                            the entire colon.                           Internal hemorrhoids were found during retroflexion.                           The exam was otherwise without abnormality on                            direct and retroflexion views. Complications:            No immediate complications. Estimated blood loss:                            None. Estimated Blood Loss:     Estimated blood loss: none. Impression:               - One 4 mm polyp in the ascending colon, removed                            with a cold snare. Resected and retrieved.                           - Diverticulosis in the entire examined colon.                           - Internal hemorrhoids.                           - The examination was otherwise normal on direct                             and retroflexion views. Recommendation:           - Repeat colonoscopy in 5-10 years for surveillance.                           - Patient has a contact number available for                            emergencies. The signs and symptoms of potential                            delayed complications were discussed with the                            patient. Return to normal activities tomorrow.                            Written discharge instructions were provided to the                            patient.                           -  Resume previous diet.                           - Continue present medications.                           - Await pathology results. Docia Chuck. Henrene Pastor, MD 06/24/2016 3:19:54 PM This report has been signed electronically.

## 2016-06-24 NOTE — Telephone Encounter (Signed)
APT. REMINDER CALL, NO ANSWER, NO VOICEMAIL °

## 2016-06-24 NOTE — Progress Notes (Signed)
Called to room to assist during endoscopic procedure.  Patient ID and intended procedure confirmed with present staff. Received instructions for my participation in the procedure from the performing physician.  

## 2016-06-24 NOTE — Progress Notes (Signed)
To recovery, report to Monday, RN, VSS 

## 2016-06-24 NOTE — Patient Instructions (Signed)
YOU HAD AN ENDOSCOPIC PROCEDURE TODAY AT [facility]:   Refer to the procedure report that was given to you for any specific questions about what was found during the examination.  If the procedure report does not answer your questions, please call your gastroenterologist to clarify.  If you requested that your care partner not be given the details of your procedure findings, then the procedure report has been included in a sealed envelope for you to review at your convenience later.  YOU SHOULD EXPECT: Some feelings of bloating in the abdomen. Passage of more gas than usual.  Walking can help get rid of the air that was put into your GI tract during the procedure and reduce the bloating. If you had a lower endoscopy (such as a colonoscopy or flexible sigmoidoscopy) you may notice spotting of blood in your stool or on the toilet paper. If you underwent a bowel prep for your procedure, you may not have a normal bowel movement for a few days.  Please Note:  You might notice some irritation and congestion in your nose or some drainage.  This is from the oxygen used during your procedure.  There is no need for concern and it should clear up in a day or so.  SYMPTOMS TO REPORT IMMEDIATELY:   Following lower endoscopy (colonoscopy or flexible sigmoidoscopy):  Excessive amounts of blood in the stool  Significant tenderness or worsening of abdominal pains  Swelling of the abdomen that is new, acute  Fever of 100F or higher  Please read all handouts given to you by your recovery nurse.  For urgent or emergent issues, a gastroenterologist can be reached at any hour by calling [phone].   DIET:  We do recommend a small meal at first, but then you may proceed to your regular diet.  Drink plenty of fluids but you should avoid alcoholic beverages for 24 hours.  ACTIVITY:  You should plan to take it easy for the rest of today and you should NOT DRIVE or use heavy machinery until  tomorrow (because of the sedation medicines used during the test).    FOLLOW UP: Our staff will call the number listed on your records the next business day following your procedure to check on you and address any questions or concerns that you may have regarding the information given to you following your procedure. If we do not reach you, we will leave a message.  However, if you are feeling well and you are not experiencing any problems, there is no need to return our call.  We will assume that you have returned to your regular daily activities without incident.  If any biopsies were taken you will be contacted by phone or by letter within the next 1-3 weeks.  Please call us at [phone] if you have not heard about the biopsies in 3 weeks.    SIGNATURES/CONFIDENTIALITY: You and/or your care partner have signed paperwork which will be entered into your electronic medical record.  These signatures attest to the fact that that the information above on your After Visit Summary has been reviewed and is understood.  Full responsibility of the confidentiality of this discharge information lies with you and/or your care-partner.  Thank you for letting us take care of your healthcare needs today,

## 2016-06-25 ENCOUNTER — Telehealth: Payer: Self-pay | Admitting: *Deleted

## 2016-06-25 ENCOUNTER — Encounter: Payer: Self-pay | Admitting: Internal Medicine

## 2016-06-25 ENCOUNTER — Ambulatory Visit (INDEPENDENT_AMBULATORY_CARE_PROVIDER_SITE_OTHER): Payer: Medicaid Other | Admitting: Internal Medicine

## 2016-06-25 VITALS — BP 126/52 | HR 58 | Temp 97.9°F | Ht 65.0 in | Wt 132.6 lb

## 2016-06-25 DIAGNOSIS — Z79891 Long term (current) use of opiate analgesic: Secondary | ICD-10-CM | POA: Diagnosis not present

## 2016-06-25 DIAGNOSIS — I779 Disorder of arteries and arterioles, unspecified: Secondary | ICD-10-CM

## 2016-06-25 DIAGNOSIS — Z7982 Long term (current) use of aspirin: Secondary | ICD-10-CM | POA: Diagnosis not present

## 2016-06-25 DIAGNOSIS — Z794 Long term (current) use of insulin: Principal | ICD-10-CM

## 2016-06-25 DIAGNOSIS — N183 Chronic kidney disease, stage 3 unspecified: Secondary | ICD-10-CM

## 2016-06-25 DIAGNOSIS — E1151 Type 2 diabetes mellitus with diabetic peripheral angiopathy without gangrene: Secondary | ICD-10-CM | POA: Diagnosis present

## 2016-06-25 DIAGNOSIS — E1140 Type 2 diabetes mellitus with diabetic neuropathy, unspecified: Secondary | ICD-10-CM | POA: Diagnosis not present

## 2016-06-25 DIAGNOSIS — IMO0002 Reserved for concepts with insufficient information to code with codable children: Secondary | ICD-10-CM

## 2016-06-25 DIAGNOSIS — F17210 Nicotine dependence, cigarettes, uncomplicated: Secondary | ICD-10-CM

## 2016-06-25 DIAGNOSIS — M47816 Spondylosis without myelopathy or radiculopathy, lumbar region: Secondary | ICD-10-CM

## 2016-06-25 DIAGNOSIS — K8681 Exocrine pancreatic insufficiency: Secondary | ICD-10-CM

## 2016-06-25 DIAGNOSIS — I10 Essential (primary) hypertension: Secondary | ICD-10-CM

## 2016-06-25 DIAGNOSIS — Z7984 Long term (current) use of oral hypoglycemic drugs: Secondary | ICD-10-CM

## 2016-06-25 DIAGNOSIS — K861 Other chronic pancreatitis: Secondary | ICD-10-CM | POA: Diagnosis not present

## 2016-06-25 DIAGNOSIS — E1122 Type 2 diabetes mellitus with diabetic chronic kidney disease: Secondary | ICD-10-CM

## 2016-06-25 DIAGNOSIS — Z79899 Other long term (current) drug therapy: Secondary | ICD-10-CM

## 2016-06-25 DIAGNOSIS — K860 Alcohol-induced chronic pancreatitis: Secondary | ICD-10-CM

## 2016-06-25 DIAGNOSIS — I129 Hypertensive chronic kidney disease with stage 1 through stage 4 chronic kidney disease, or unspecified chronic kidney disease: Secondary | ICD-10-CM | POA: Diagnosis not present

## 2016-06-25 DIAGNOSIS — E1165 Type 2 diabetes mellitus with hyperglycemia: Secondary | ICD-10-CM

## 2016-06-25 DIAGNOSIS — I739 Peripheral vascular disease, unspecified: Secondary | ICD-10-CM

## 2016-06-25 DIAGNOSIS — N939 Abnormal uterine and vaginal bleeding, unspecified: Secondary | ICD-10-CM | POA: Diagnosis not present

## 2016-06-25 LAB — GLUCOSE, CAPILLARY: Glucose-Capillary: 171 mg/dL — ABNORMAL HIGH (ref 65–99)

## 2016-06-25 LAB — POCT GLYCOSYLATED HEMOGLOBIN (HGB A1C): Hemoglobin A1C: 7.6

## 2016-06-25 NOTE — Patient Instructions (Signed)
I want you to increase your exercise and stretching.

## 2016-06-25 NOTE — Telephone Encounter (Signed)
No answer will attempt to call back later this afternoon for follow up call. SM

## 2016-06-25 NOTE — Telephone Encounter (Signed)
  Follow up Call-  Call back number 06/24/2016  Post procedure Call Back phone  # [phone]  Permission to leave phone message No  Some recent data might be hidden     Patient questions:  Do you have a fever, pain , or abdominal swelling? No. Pain Score  0 *  Have you tolerated food without any problems? Yes.    Have you been able to return to your normal activities? Yes.    Do you have any questions about your discharge instructions: Diet   No. Medications  No. Follow up visit  No.  Do you have questions or concerns about your Care? No.  Actions: * If pain score is 4 or above: No action needed, pain <4.

## 2016-06-26 NOTE — Assessment & Plan Note (Signed)
History of present illness: Reports taking her medication, denies any complaints  Assessment essential hypertension  Plan Continue losartan 100 mg daily Continue amlodipine 5 mg daily

## 2016-06-26 NOTE — Assessment & Plan Note (Signed)
History of present illness she has not been able to tolerate gabapentin, amitriptyline and for about the past year she's been on Lyrica. From the [place] chronic controlled substances reporting system I can see that she has not taken this exactly as prescribed and a 1 month prescription last 1.5-2 months.  When asking her about that today she reports that she has completely stopped the Lyrica, she reports that she was having some lower extremity swelling which completely resolved when she stopped the Lyrica. She does continue to have some tingling as well as some pain in her legs at night she discusses that her podiatrist has told her that the damage is done and she now accepts that more.  Assessment painful diabetic neuropathy  Plan We'll discontinue Lyrica given concern for side effect of lower extremity swelling. At this time we will not start any new medications

## 2016-06-26 NOTE — Assessment & Plan Note (Addendum)
History of present illness she has been taking metformin 500 mg once a day as well as 50 mg of Januvia. She denies any side effects from this reports it is working well. She does not usually check her blood sugars unless she feels unwell. 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 I have encouraged her to start a walking exercise program. Her A1c is down a little but states is 7.6 she does have some competitions of CK D put a A1c goal of less than 7 is reasonable for her I think we may be get there with exercise and diet alone.

## 2016-06-26 NOTE — Assessment & Plan Note (Addendum)
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 It appears that back to 2014 she was treated with tramadol, in early 2017 this was changed to low-dose Norco. In June 2017 I attempted  to get a UDS and however patient left without being obtained I was not sure that this is completely her fault. However this past February I did obtain a UDS which was positive for alcohol, positive for THC, negative for hydrocodone.  Subsequently I plan to discuss this result with her at her last visit, however she canceled this. She then continued to request hydrocodone refills.  I requested her to come in today to be seen. Additionally of note, on my review of controlled substances reporting system she did receive a prescription February 23 which she filled on the 24th from an ED provider. Despite that I had seen her 10 days earlier and provided her refills which is when I had obtained the UDS.  Additionally she was prescribed oxycodone by her OB after the D&C, this is listed as a 3 day prescription which sounds reasonable however 2 days after her refill of the oxycodone (which did not notify me) she requested that I refill her oxycodone. Today I discussed with her the abnormal UDS. Her responses that she treats alcohol occasionally and that's not a crime.  She denies any use of marijuana, I asked her if she uses any hemp oils or over-the-counter supplements but she also denies that.  I again asked her if she was taking her medication every day as prescribed which she reports that she was.  I asked her how specifically that medication was helping her, she reports that it helps the pain,  I also asked her what functional limitation she has had without having the pain medication, she denies any functional impairment.  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 Therefore I told her that I would not be continuing to prescribe opioid medications, I did not provide a taper of this as her UDS has been negative. I discussed this with her at the end of the visit, she became upset and told me that this visit was "worthless" and that I had no right to get her up at 8 in the morning for this.  She told me that her whole "family gets pain medication, this is not the last of this, I will get pain medication."  I attempted to calm her and tried to instruct her that I'm trying to do much duty responsibly. Continue to treat her for both her back pain as well as her numerous other medical conditions.

## 2016-06-26 NOTE — Assessment & Plan Note (Addendum)
History of present illness she denies any fatty stools, her weight is stable she reports compliance with her Creon. She does admit to continued intermittent alcohol consumption  Assessment chronic pancreatitis with exocrine dysfunction  Plan We'll continue Creon She has tolerated Januvia without any issue or worsening pancreatic dysfunction for now I think it's reasonable to continue the Januvia.

## 2016-06-26 NOTE — Assessment & Plan Note (Signed)
History of present illness: She denies any symptoms of claudication.  Assessment peripheral arterial disease  Plan Continue atorvastatin 40 mg daily Continue 81 mg of aspirin daily

## 2016-06-26 NOTE — Progress Notes (Signed)
[facility] Subjective:  HPI: [name] is a 66 y.o. female who presents for follow up of her chronic pain and diabetes.  Please see problem based charting below for the status of her chronic medical issues.     Review of Systems: Review of Systems  Constitutional: Negative for fever.  HENT: Negative for hearing loss.   Respiratory: Negative for shortness of breath.   Cardiovascular: Negative for leg swelling.  Gastrointestinal: Negative for abdominal pain, blood in stool and constipation.  Genitourinary: Negative for dysuria and hematuria.  Musculoskeletal: Negative for back pain, falls and joint pain.  Neurological: Positive for tingling.    Objective:  Physical Exam: Vitals:   06/25/16 0838  BP: (!) 126/52  Pulse: (!) 58  Temp: 97.9 F (36.6 C)  TempSrc: Oral  SpO2: 100%  Weight: 132 lb 9.6 oz (60.1 kg)  Height: 5\' 5"  (1.651 m)  Physical Exam  Constitutional: She is well-developed, well-nourished, and in no distress.  Cardiovascular: Normal rate and regular rhythm.   Pulmonary/Chest: Effort normal and breath sounds normal.  Abdominal: Soft. Bowel sounds are normal.  Musculoskeletal: She exhibits no edema.       Lumbar back: She exhibits normal range of motion, no tenderness and no bony tenderness.  Negative straight leg raise bilaterally.   Neurological: She has normal reflexes. Gait normal.  Nursing note and vitals reviewed.  Assessment & Plan:  Essential hypertension History of present illness: Reports taking her medication, denies any complaints  Assessment essential hypertension  Plan Continue losartan 100 mg daily Continue amlodipine 5 mg daily  Peripheral arterial disease ([place]) History of present illness: She denies any symptoms of claudication.  Assessment peripheral arterial disease  Plan Continue atorvastatin 40 mg daily Continue 81 mg of aspirin daily  Bilateral carotid artery disease (HCC) History of present  illness: She was noted to have calcifications of her carotid arteries on previous imaging, I wanted to classify the severity and thus ordered carotid ultrasound duplex. She is yet to get this completed.  She does report that she did not get her mammogram done because she woke up one morning with some neck pain and thus did not feel that she can get that done she thought that it was due to the plaques in her arteries. She has not had any focal neurologic symptoms.  Assessment: Bilateral carotid artery disease  Plan I showed her that her symptoms of the neck pain were likely due to muscle strain or spasm and not due to her carotid artery disease I discussed once again the importance of obtaining the ultrasound imaging of her neck arteries. Overall we will continue her statin and aspirin,  Chronic pancreatitis ([place]) History of present illness she denies any fatty stools, her weight is stable she reports compliance with her Creon. She does admit to continued intermittent alcohol consumption  Assessment chronic pancreatitis with exocrine dysfunction  Plan We'll continue Creon She has tolerated Januvia without any issue or worsening pancreatic dysfunction for now I think it's reasonable to continue the Januvia.   Diabetic neuropathy, painful ([place]) History of present illness she has not been able to tolerate gabapentin, amitriptyline and for about the past year she's been on Lyrica. From the [place] chronic controlled substances reporting system I can see that she has not taken this exactly as prescribed and a 1 month prescription last 1.5-2 months.  When asking her about that today she reports that she has completely stopped the Lyrica, she reports that  she was having some lower extremity swelling which completely resolved when she stopped the Lyrica. She does continue to have some tingling as well as some pain in her legs at night she discusses that her podiatrist has told her that the damage is done and  she now accepts that more.  Assessment painful diabetic neuropathy  Plan We'll discontinue Lyrica given concern for side effect of lower extremity swelling. At this time we will not start any new medications  Uncontrolled type 2 diabetes mellitus with chronic kidney disease, without long-term current use of insulin (HCC) History of present illness she has been taking metformin 500 mg once a day as well as 50 mg of Januvia. She denies any side effects from this reports it is working well. She does not usually check her blood sugars unless she feels unwell. 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 I have encouraged her to start a walking exercise program. Her A1c is down a little but states is 7.6 she does have some competitions of CK D put a A1c goal of less than 7 is reasonable for her I think we may be get there with exercise and diet alone.  Abnormal uterine bleeding History of present illness she continues to have postmenopausal uterine bleeding. She had previously underwent workup with the endometrial biopsy. She has been following with Dr. Elly Modena with OB/GYN and recently underwent D&C with biopsy. She has follow-up next week with Dr. Elly Modena.  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 It appears that back to 2014 she was treated with tramadol, in early 2017 this was changed to low-dose Norco. In June 2017 I attempted  to get a UDS and however patient left without being obtained I was not sure that this is completely her fault. However this past February I did obtain a UDS which was positive for  alcohol, positive for THC, negative for hydrocodone.  Subsequently I plan to discuss this result with her at her last visit, however she canceled this. She then continued to request hydrocodone refills.  I requested her to come in today to be seen. Additionally of note, on my review of controlled substances reporting system she did receive a prescription February 23 which she filled on the 24th from an ED provider. Despite that I had seen her 10 days earlier and provided her refills which is when I had obtained the UDS.  Additionally she was prescribed oxycodone by her OB after the D&C, this is listed as a 3 day prescription which sounds reasonable however 2 days after her refill of the oxycodone (which did not notify me) she requested that I refill her oxycodone. Today I discussed with her the abnormal UDS. Her responses that she treats alcohol occasionally and that's not a crime.  She denies any use of marijuana, I asked her if she uses any hemp oils or over-the-counter supplements but she also denies that.  I again asked her if she was taking her medication every day as prescribed which she reports that she was.  I asked her how specifically that medication was helping her, she reports that it helps  the pain,  I also asked her what functional limitation she has had without having the pain medication, she denies any functional impairment.  A: OA of lumbar spine on chronic opioid therapy  P: At this point I cannot see much pain on her physical exam, she is able to get up and off the exam table without trouble, she has had many red flag behaviors and I do not see much clear benefit from even the low dose of opioid medications I was providing. Therefore I told her that I would not be continuing to prescribe opioid medications, I did not provide a taper of this as her UDS has been negative. I discussed this with her at the end of the visit, she became upset and told me that this visit was "worthless" and that I  had no right to get her up at 8 in the morning for this.  She told me that her whole "family gets pain medication, this is not the last of this, I will get pain medication."  I attempted to calm her and tried to instruct her that I'm trying to do much duty responsibly. Continue to treat her for both her back pain as well as her numerous other medical conditions.  I spent 40 minutes face to face with the patient. Greater than 50% of the time was spent on counseling and coordination of care, which is detailed in the A&P.    Medications Ordered No orders of the defined types were placed in this encounter.  Other Orders Orders Placed This Encounter  Procedures  . Glucose, capillary  . POCT HgB A1C (CPT [id number])   Follow Up: Return in about 3 months (around 09/25/2016).

## 2016-06-26 NOTE — Assessment & Plan Note (Signed)
History of present illness: She was noted to have calcifications of her carotid arteries on previous imaging, I wanted to classify the severity and thus ordered carotid ultrasound duplex. She is yet to get this completed.  She does report that she did not get her mammogram done because she woke up one morning with some neck pain and thus did not feel that she can get that done she thought that it was due to the plaques in her arteries. She has not had any focal neurologic symptoms.  Assessment: Bilateral carotid artery disease  Plan I showed her that her symptoms of the neck pain were likely due to muscle strain or spasm and not due to her carotid artery disease I discussed once again the importance of obtaining the ultrasound imaging of her neck arteries. Overall we will continue her statin and aspirin,

## 2016-06-26 NOTE — Assessment & Plan Note (Signed)
History of present illness she continues to have postmenopausal uterine bleeding. She had previously underwent workup with the endometrial biopsy. She has been following with Dr. Elly Modena with OB/GYN and recently underwent D&C with biopsy. She has follow-up next week with Dr. Elly Modena.  Assessment abnormal uterine bleeding  Plan: follow up with Dr Elly Modena

## 2016-06-30 ENCOUNTER — Encounter: Payer: Self-pay | Admitting: Internal Medicine

## 2016-06-30 DIAGNOSIS — K5730 Diverticulosis of large intestine without perforation or abscess without bleeding: Secondary | ICD-10-CM | POA: Insufficient documentation

## 2016-06-30 DIAGNOSIS — D126 Benign neoplasm of colon, unspecified: Secondary | ICD-10-CM | POA: Insufficient documentation

## 2016-07-01 ENCOUNTER — Ambulatory Visit: Payer: Medicaid Other | Admitting: Obstetrics and Gynecology

## 2016-07-08 ENCOUNTER — Ambulatory Visit (INDEPENDENT_AMBULATORY_CARE_PROVIDER_SITE_OTHER): Payer: Medicaid Other | Admitting: Podiatry

## 2016-07-08 DIAGNOSIS — L97511 Non-pressure chronic ulcer of other part of right foot limited to breakdown of skin: Secondary | ICD-10-CM | POA: Diagnosis not present

## 2016-07-08 DIAGNOSIS — L97501 Non-pressure chronic ulcer of other part of unspecified foot limited to breakdown of skin: Secondary | ICD-10-CM

## 2016-07-08 LAB — HM DIABETES FOOT EXAM

## 2016-07-08 NOTE — Patient Instructions (Signed)
Apply Silvadene cream to the skin ulcer on the bottom of the right foot daily and cover with gauze Wear the surgical shoe on your right foot Limits standing and walking  Diabetes and Foot Care Diabetes may cause you to have problems because of poor blood supply (circulation) to your feet and legs. This may cause the skin on your feet to become thinner, break easier, and heal more slowly. Your skin may become dry, and the skin may peel and crack. You may also have nerve damage in your legs and feet causing decreased feeling in them. You may not notice minor injuries to your feet that could lead to infections or more serious problems. Taking care of your feet is one of the most important things you can do for yourself. Follow these instructions at home:  Wear shoes at all times, even in the house. Do not go barefoot. Bare feet are easily injured.  Check your feet daily for blisters, cuts, and redness. If you cannot see the bottom of your feet, use a mirror or ask someone for help.  Wash your feet with warm water (do not use hot water) and mild soap. Then pat your feet and the areas between your toes until they are completely dry. Do not soak your feet as this can dry your skin.  Apply a moisturizing lotion or petroleum jelly (that does not contain alcohol and is unscented) to the skin on your feet and to dry, brittle toenails. Do not apply lotion between your toes.  Trim your toenails straight across. Do not dig under them or around the cuticle. File the edges of your nails with an emery board or nail file.  Do not cut corns or calluses or try to remove them with medicine.  Wear clean socks or stockings every day. Make sure they are not too tight. Do not wear knee-high stockings since they may decrease blood flow to your legs.  Wear shoes that fit properly and have enough cushioning. To break in new shoes, wear them for just a few hours a day. This prevents you from injuring your feet. Always  look in your shoes before you put them on to be sure there are no objects inside.  Do not cross your legs. This may decrease the blood flow to your feet.  If you find a minor scrape, cut, or break in the skin on your feet, keep it and the skin around it clean and dry. These areas may be cleansed with mild soap and water. Do not cleanse the area with peroxide, alcohol, or iodine.  When you remove an adhesive bandage, be sure not to damage the skin around it.  If you have a wound, look at it several times a day to make sure it is healing.  Do not use heating pads or hot water bottles. They may burn your skin. If you have lost feeling in your feet or legs, you may not know it is happening until it is too late.  Make sure your health care provider performs a complete foot exam at least annually or more often if you have foot problems. Report any cuts, sores, or bruises to your health care provider immediately. Contact a health care provider if:  You have an injury that is not healing.  You have cuts or breaks in the skin.  You have an ingrown nail.  You notice redness on your legs or feet.  You feel burning or tingling in your legs or feet.  You have pain or cramps in your legs and feet.  Your legs or feet are numb.  Your feet always feel cold. Get help right away if:  There is increasing redness, swelling, or pain in or around a wound.  There is a red line that goes up your leg.  Pus is coming from a wound.  You develop a fever or as directed by your health care provider.  You notice a bad smell coming from an ulcer or wound. This information is not intended to replace advice given to you by your health care provider. Make sure you discuss any questions you have with your health care provider. Document Released: 02/07/2000 Document Revised: 07/18/2015 Document Reviewed: 07/19/2012 Elsevier Interactive Patient Education  2017 [employer].

## 2016-07-09 ENCOUNTER — Encounter: Payer: Self-pay | Admitting: Podiatry

## 2016-07-09 NOTE — Progress Notes (Signed)
Patient ID: [name], female   DOB: 08/23/1950, 66 y.o.   MRN: [id number]   Subjective:  Subjective: This patient presents for ongoing maintenance for pre-ulcerative or ulcerative plantar skin lesions on right and left feet. plantar fifth right MPJ as well as ongoing debridement of pre-ulcerative calluses.Also complaining of thickened uncomfortable toenails right and left feet Patient is a known diabetic with a history of peripheral arterial disease and neuropathy Patient continue to smoke. The superficial ulcer on the visit of 06/23/2016 was 5 mm in diameter  Objective: Objective: Orientated 3 DP pulses 2/4 bilaterally PT pulses trace palpable bilaterally Capillary reflex immediate bilaterally Sensation to 10 g monofilament wire intact 5/5 bilaterally Vibratory sensation reactive bilaterally Ankle reflex equal and reactive bilaterally 5 mm superficial ulcer with granular base fifth right  Pre-ulcerative plantar calluses first MPJ bilaterally and fifth left MPJ Nucleated keratoses plantar left heel  Assessment: Assessment: Diabetic with peripheral neuropathy and peripheral arterial disease Pre-ulcerative plantar calluses 3 Superficial noninfected ulcer fifth MPJ right  Plan: Debride plantar ulcer fifth MPJ right and plantar left first MPJ and apply Silvadene dressing Instructed patient apply Silvadene dressing and gauze to the fifth MPJ ulcer right and wear a surgical shoe on the right foot Debride plantar pre-ulcerative calluses 3 without any bleeding  Reappoint 7 days

## 2016-07-10 ENCOUNTER — Encounter: Payer: Self-pay | Admitting: Internal Medicine

## 2016-07-15 ENCOUNTER — Ambulatory Visit (INDEPENDENT_AMBULATORY_CARE_PROVIDER_SITE_OTHER): Payer: Medicaid Other | Admitting: Podiatry

## 2016-07-15 ENCOUNTER — Encounter: Payer: Self-pay | Admitting: Podiatry

## 2016-07-15 DIAGNOSIS — L97501 Non-pressure chronic ulcer of other part of unspecified foot limited to breakdown of skin: Secondary | ICD-10-CM

## 2016-07-15 DIAGNOSIS — I739 Peripheral vascular disease, unspecified: Secondary | ICD-10-CM

## 2016-07-15 DIAGNOSIS — E1140 Type 2 diabetes mellitus with diabetic neuropathy, unspecified: Secondary | ICD-10-CM | POA: Diagnosis not present

## 2016-07-15 NOTE — Progress Notes (Signed)
Patient ID: [name], female   DOB: 1950/08/05, 66 y.o.   MRN: [id number]    Subjective: This patient presents for ongoing maintenance for pre-ulcerative or ulcerative plantar skin lesions on right and left feet. plantar fifth right MPJ as well as ongoing debridement of pre-ulcerative calluses.Patient is a known diabetic with a history of peripheral arterial disease and neuropathy Patient continue to smoke. The superficial ulcer on the visit of 06/23/2016 was 5 mm in diameter  Objective: Objective: Orientated 3 DP pulses 2/4 bilaterally PT pulses trace palpable bilaterally Capillary reflex immediate bilaterally Sensation to 10 g monofilament wire intact 5/5 bilaterally Vibratory sensation reactive bilaterally Ankle reflex equal and reactive bilaterally 5 mm superficial ulcer with granular base fifth right . The ulcer surrounded by hyperkeratotic tissue. There is no surrounding erythema, edema, drainage, warmth or malodor Pre-ulcerative plantar calluses first MPJ bilaterally and fifth left MPJ Nucleated keratoses plantar left heel  Assessment: Assessment: Diabetic with peripheral neuropathy and peripheral arterial disease Pre-ulcerative plantar calluses 3 Superficial noninfected ulcer fifth MPJ right  Plan: Debride plantar ulcer fifth MPJ right and plantar left first MPJ and apply Silvadene dressing Instructed patient apply Silvadene dressing and gauze to the fifth MPJ ulcer right and wear a surgical shoe on the right foot Debride plantar pre-ulcerative calluses 3 without any bleeding  Reappoint 7 days

## 2016-07-15 NOTE — Patient Instructions (Signed)
Apply Silvadene cream daily to skin also on the bottom of the right foot and cover with gauze. Wear the surgical shoe on the right foot Limits standing and walking  Diabetes and Foot Care Diabetes may cause you to have problems because of poor blood supply (circulation) to your feet and legs. This may cause the skin on your feet to become thinner, break easier, and heal more slowly. Your skin may become dry, and the skin may peel and crack. You may also have nerve damage in your legs and feet causing decreased feeling in them. You may not notice minor injuries to your feet that could lead to infections or more serious problems. Taking care of your feet is one of the most important things you can do for yourself. Follow these instructions at home:  Wear shoes at all times, even in the house. Do not go barefoot. Bare feet are easily injured.  Check your feet daily for blisters, cuts, and redness. If you cannot see the bottom of your feet, use a mirror or ask someone for help.  Wash your feet with warm water (do not use hot water) and mild soap. Then pat your feet and the areas between your toes until they are completely dry. Do not soak your feet as this can dry your skin.  Apply a moisturizing lotion or petroleum jelly (that does not contain alcohol and is unscented) to the skin on your feet and to dry, brittle toenails. Do not apply lotion between your toes.  Trim your toenails straight across. Do not dig under them or around the cuticle. File the edges of your nails with an emery board or nail file.  Do not cut corns or calluses or try to remove them with medicine.  Wear clean socks or stockings every day. Make sure they are not too tight. Do not wear knee-high stockings since they may decrease blood flow to your legs.  Wear shoes that fit properly and have enough cushioning. To break in new shoes, wear them for just a few hours a day. This prevents you from injuring your feet. Always look  in your shoes before you put them on to be sure there are no objects inside.  Do not cross your legs. This may decrease the blood flow to your feet.  If you find a minor scrape, cut, or break in the skin on your feet, keep it and the skin around it clean and dry. These areas may be cleansed with mild soap and water. Do not cleanse the area with peroxide, alcohol, or iodine.  When you remove an adhesive bandage, be sure not to damage the skin around it.  If you have a wound, look at it several times a day to make sure it is healing.  Do not use heating pads or hot water bottles. They may burn your skin. If you have lost feeling in your feet or legs, you may not know it is happening until it is too late.  Make sure your health care provider performs a complete foot exam at least annually or more often if you have foot problems. Report any cuts, sores, or bruises to your health care provider immediately. Contact a health care provider if:  You have an injury that is not healing.  You have cuts or breaks in the skin.  You have an ingrown nail.  You notice redness on your legs or feet.  You feel burning or tingling in your legs or feet.  You  have pain or cramps in your legs and feet.  Your legs or feet are numb.  Your feet always feel cold. Get help right away if:  There is increasing redness, swelling, or pain in or around a wound.  There is a red line that goes up your leg.  Pus is coming from a wound.  You develop a fever or as directed by your health care provider.  You notice a bad smell coming from an ulcer or wound. This information is not intended to replace advice given to you by your health care provider. Make sure you discuss any questions you have with your health care provider. Document Released: 02/07/2000 Document Revised: 07/18/2015 Document Reviewed: 07/19/2012 Elsevier Interactive Patient Education  2017 [employer].

## 2016-07-16 ENCOUNTER — Ambulatory Visit: Payer: Medicaid Other | Admitting: Obstetrics and Gynecology

## 2016-07-16 ENCOUNTER — Telehealth: Payer: Self-pay

## 2016-07-16 NOTE — Telephone Encounter (Signed)
Attempted to contact pt about missed appt for the second time.  Unable to LM due to pt phone kept ringing.  Letter sent to reschedule appt if she is having bleeding or GYN concerns per recommendation of Dr. Elly Modena.

## 2016-07-22 ENCOUNTER — Encounter: Payer: Self-pay | Admitting: Podiatry

## 2016-07-22 ENCOUNTER — Ambulatory Visit (INDEPENDENT_AMBULATORY_CARE_PROVIDER_SITE_OTHER): Payer: Medicaid Other | Admitting: Podiatry

## 2016-07-22 DIAGNOSIS — L97518 Non-pressure chronic ulcer of other part of right foot with other specified severity: Secondary | ICD-10-CM | POA: Diagnosis not present

## 2016-07-22 DIAGNOSIS — L97501 Non-pressure chronic ulcer of other part of unspecified foot limited to breakdown of skin: Secondary | ICD-10-CM

## 2016-07-22 DIAGNOSIS — I739 Peripheral vascular disease, unspecified: Secondary | ICD-10-CM

## 2016-07-22 NOTE — Progress Notes (Signed)
Patient ID: [name], female   DOB: May 06, 1950, 66 y.o.   MRN: [id number]    Subjective: This patient presents for ongoing maintenance for pre-ulcerative or ulcerative plantar skin lesions on right and left feet. plantar fifth right MPJ as well as ongoing debridement of pre-ulcerative calluses.Patient is a known diabetic with a history of peripheral arterial disease and neuropathy Patient continue to smoke. The superficial ulcer on the visit of 06/23/2016 was 5 mm in diameter  Objective: Objective: Orientated 3 DP pulses 2/4 bilaterally PT pulses trace palpable bilaterally Capillary reflex immediate bilaterally Sensation to 10 g monofilament wire intact 5/5 bilaterally Vibratory sensation reactive bilaterally Ankle reflex equal and reactive bilaterally 5 mm superficial ulcer with granular base fifth right . The ulcer surrounded by hyperkeratotic tissue. There is no surrounding erythema, edema, drainage, warmth or malodor Pre-ulcerative plantar calluses first MPJ bilaterally and fifth left MPJ Nucleated keratoses plantar left heel  Assessment: Assessment: Diabetic with peripheral neuropathy and peripheral arterial disease Pre-ulcerative plantar calluses 3 Superficial noninfected ulcer fifth MPJ right  Plan: Debride plantar ulcer fifth MPJ right and plantar left first MPJ and apply Silvadene dressing Instructed patient apply Silvadene dressing and gauze to the fifth MPJ ulcer right and wear a surgical shoe on the right foot Debride plantar pre-ulcerative calluses 3 without anybleeding  Reappoint 7 days

## 2016-07-22 NOTE — Patient Instructions (Signed)
Apply Silvadene cream daily to skin ulcer on the bottom the right foot and cover with gauze Wear the surgical shoe on right foot Limits standing and walking  Diabetes and Foot Care Diabetes may cause you to have problems because of poor blood supply (circulation) to your feet and legs. This may cause the skin on your feet to become thinner, break easier, and heal more slowly. Your skin may become dry, and the skin may peel and crack. You may also have nerve damage in your legs and feet causing decreased feeling in them. You may not notice minor injuries to your feet that could lead to infections or more serious problems. Taking care of your feet is one of the most important things you can do for yourself. Follow these instructions at home:  Wear shoes at all times, even in the house. Do not go barefoot. Bare feet are easily injured.  Check your feet daily for blisters, cuts, and redness. If you cannot see the bottom of your feet, use a mirror or ask someone for help.  Wash your feet with warm water (do not use hot water) and mild soap. Then pat your feet and the areas between your toes until they are completely dry. Do not soak your feet as this can dry your skin.  Apply a moisturizing lotion or petroleum jelly (that does not contain alcohol and is unscented) to the skin on your feet and to dry, brittle toenails. Do not apply lotion between your toes.  Trim your toenails straight across. Do not dig under them or around the cuticle. File the edges of your nails with an emery board or nail file.  Do not cut corns or calluses or try to remove them with medicine.  Wear clean socks or stockings every day. Make sure they are not too tight. Do not wear knee-high stockings since they may decrease blood flow to your legs.  Wear shoes that fit properly and have enough cushioning. To break in new shoes, wear them for just a few hours a day. This prevents you from injuring your feet. Always look in your  shoes before you put them on to be sure there are no objects inside.  Do not cross your legs. This may decrease the blood flow to your feet.  If you find a minor scrape, cut, or break in the skin on your feet, keep it and the skin around it clean and dry. These areas may be cleansed with mild soap and water. Do not cleanse the area with peroxide, alcohol, or iodine.  When you remove an adhesive bandage, be sure not to damage the skin around it.  If you have a wound, look at it several times a day to make sure it is healing.  Do not use heating pads or hot water bottles. They may burn your skin. If you have lost feeling in your feet or legs, you may not know it is happening until it is too late.  Make sure your health care provider performs a complete foot exam at least annually or more often if you have foot problems. Report any cuts, sores, or bruises to your health care provider immediately. Contact a health care provider if:  You have an injury that is not healing.  You have cuts or breaks in the skin.  You have an ingrown nail.  You notice redness on your legs or feet.  You feel burning or tingling in your legs or feet.  You have pain  or cramps in your legs and feet.  Your legs or feet are numb.  Your feet always feel cold. Get help right away if:  There is increasing redness, swelling, or pain in or around a wound.  There is a red line that goes up your leg.  Pus is coming from a wound.  You develop a fever or as directed by your health care provider.  You notice a bad smell coming from an ulcer or wound. This information is not intended to replace advice given to you by your health care provider. Make sure you discuss any questions you have with your health care provider. Document Released: 02/07/2000 Document Revised: 07/18/2015 Document Reviewed: 07/19/2012 Elsevier Interactive Patient Education  2017 [employer].

## 2016-07-26 IMAGING — CT CT RENAL STONE PROTOCOL
2 of 4 series · 12 of 46 positions shown, 14 images · non-contrast
Comparison: Renal ultrasound November 03, 2014 and CT abdomen and
pelvis May 28, 2014

CLINICAL DATA: LEFT flank pain today, and history of kidney stones.
History vaginal bleeding, diabetes, hypertension, hemorrhoids.

EXAM:
CT ABDOMEN AND PELVIS WITHOUT CONTRAST
TECHNIQUE: Multidetector CT imaging of the abdomen and pelvis was performed
following the standard protocol without IV contrast.

[Series 201: stone study, idose (2) · axial · 0.68mm/px · z∈[+12,+332]mm · 9 of 76 slices shown, 11 images]
[im 6/76  soft-tissue]
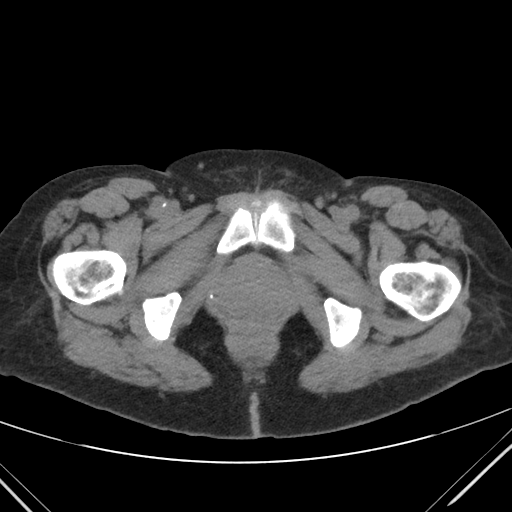
[im 6/76  bone]
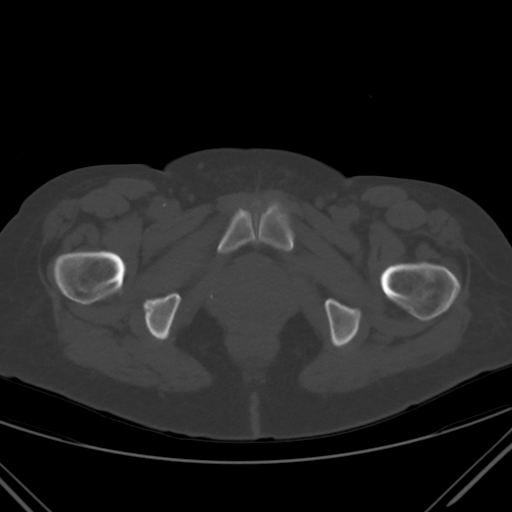
[im 15/76  soft-tissue]
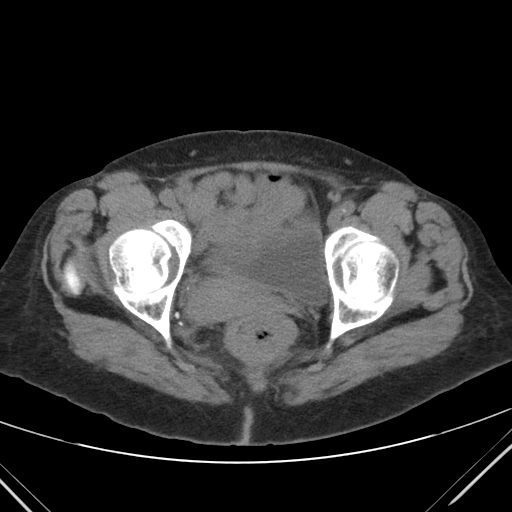
[im 21/76  soft-tissue]
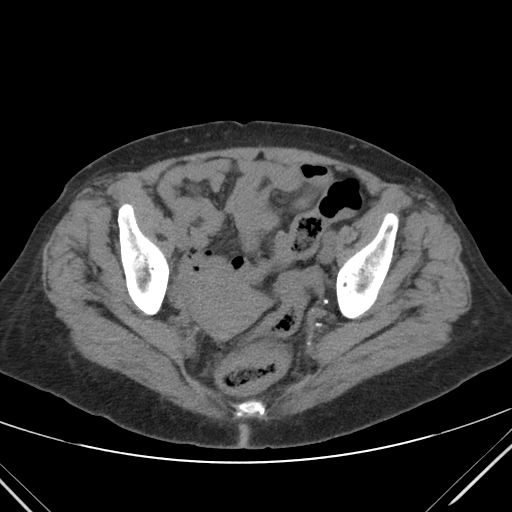
[im 29/76  soft-tissue]
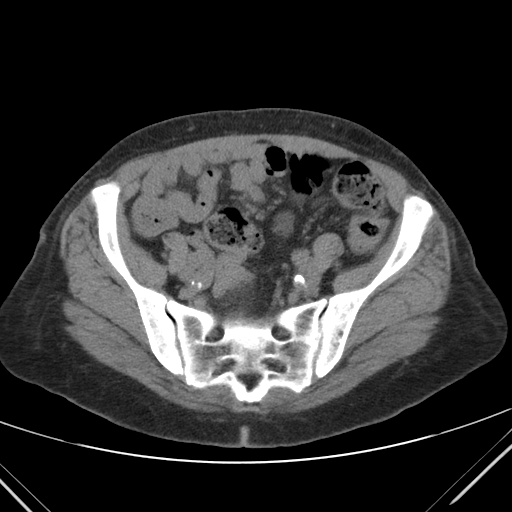
[im 38/76  soft-tissue]
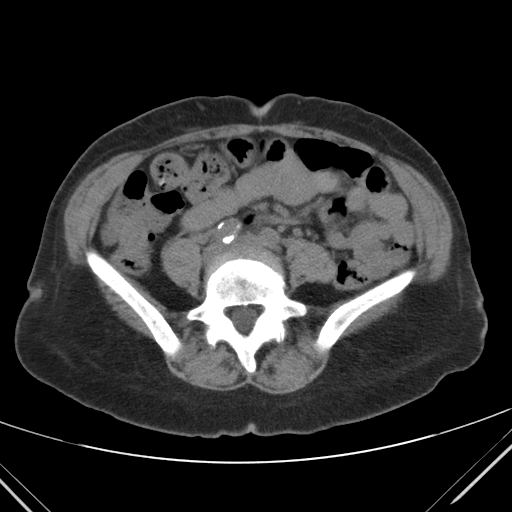
[im 47/76  soft-tissue]
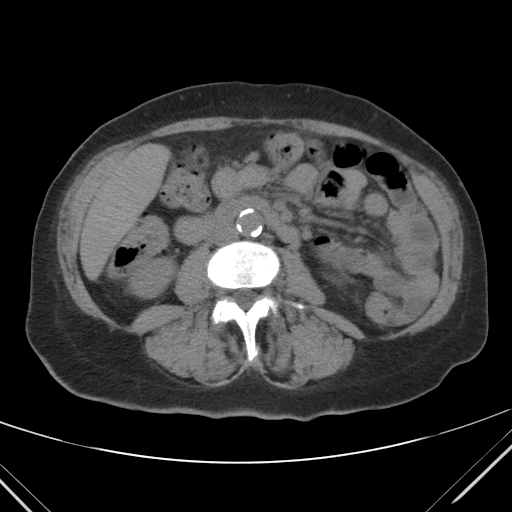
[im 55/76  soft-tissue]
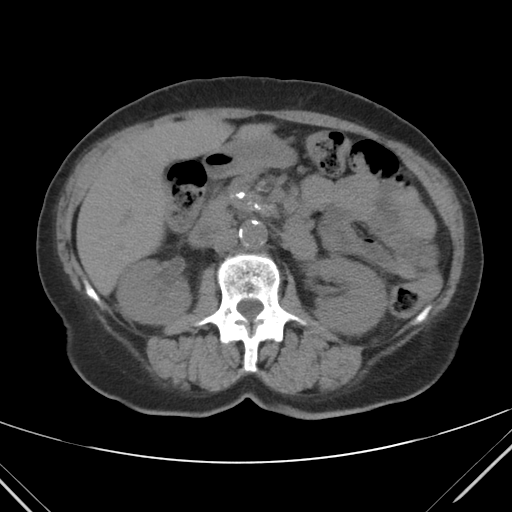
[im 61/76  soft-tissue]
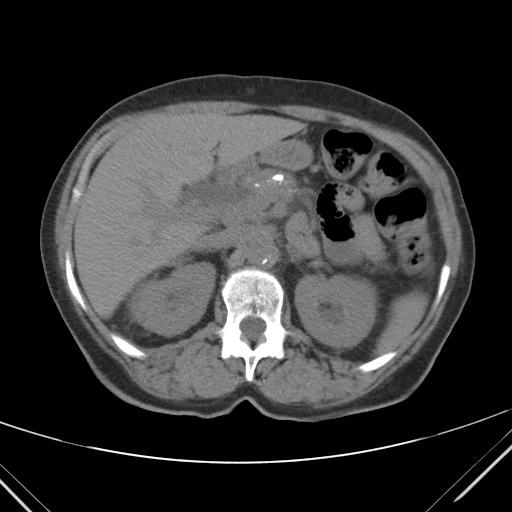
[im 70/76  soft-tissue]
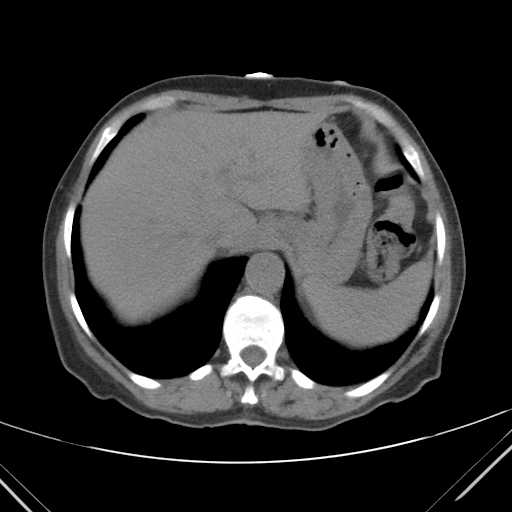
[im 70/76  bone]
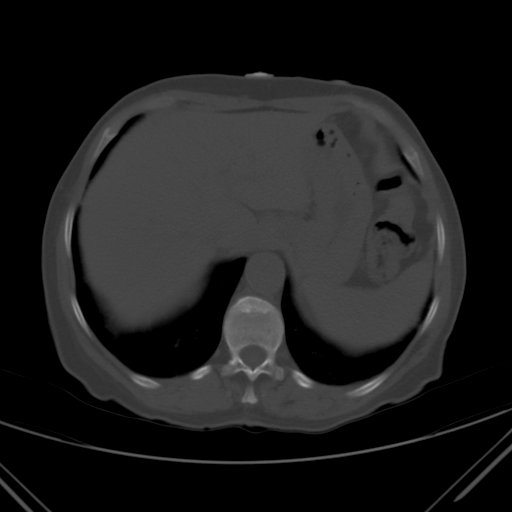

[Series 203: coronals, idose (2) · coronal · 0.45mm/px · 3 of 100 slices shown]
[im 34/100  soft-tissue]
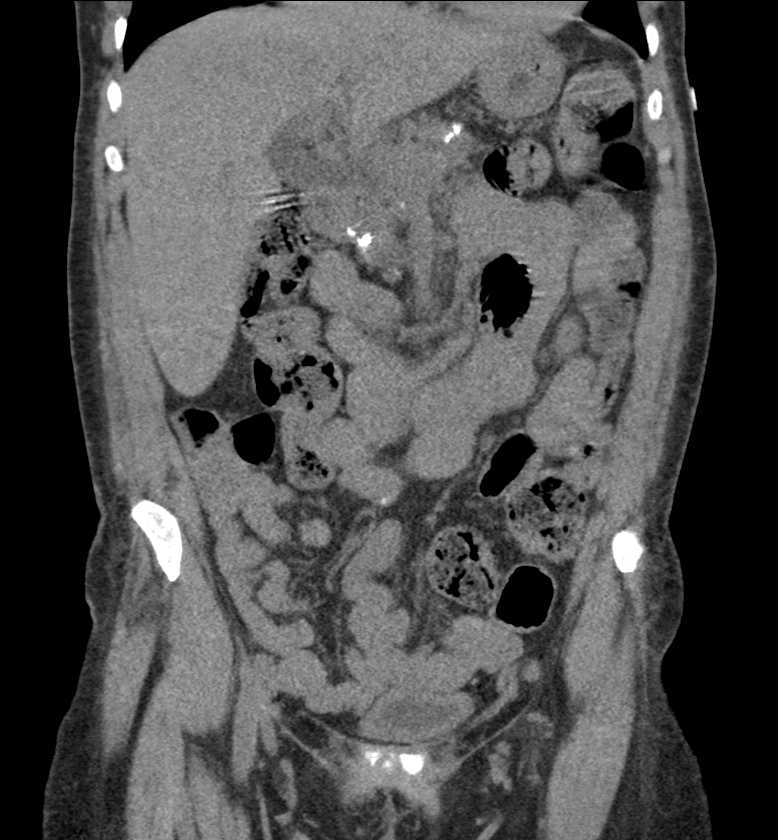
[im 45/100  soft-tissue]
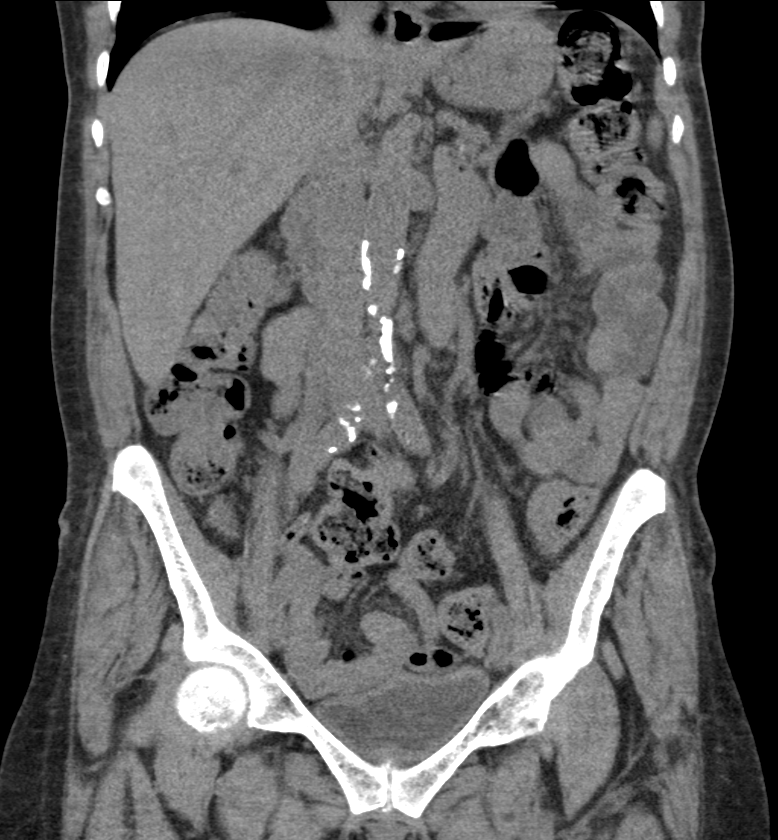
[im 56/100  soft-tissue]
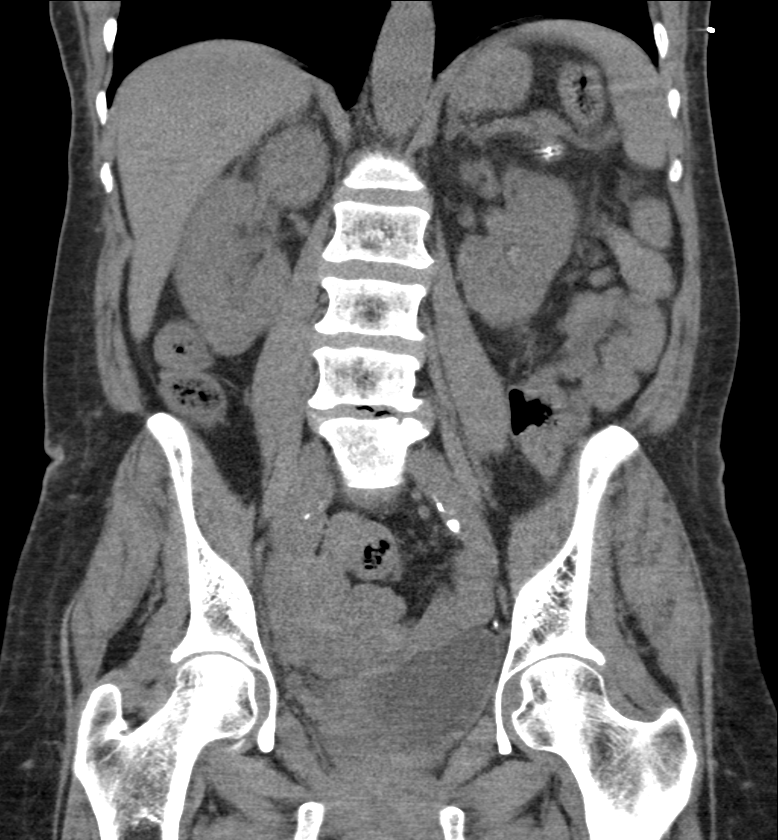

[12 of 46 positions shown; findings below may reference images not displayed]

FINDINGS: LUNG BASES: Moderate cutaneous lung attenuation can be seen with
small airway disease. The heart is likely mildly enlarged, partially
imaged. No pericardial effusion. Air distended distal esophagus can
be associated with reflux.

KIDNEYS/BLADDER: Kidneys are orthotopic, demonstrating normal size
and morphology. Mild bilateral pelviectasis without frank
hydronephrosis. No nephrolithiasis; limited assessment for renal
masses on this nonenhanced examination. The unopacified ureters are
normal in course and caliber. Urinary bladder is partially distended
and unremarkable.

SOLID ORGANS: The liver, spleen, and adrenal glands are unremarkable
for this non-contrast examination. Status post cholecystectomy.
Pancreatic calcifications, no definite residual pseudocyst though
contrast-enhanced imaging would be more sensitive.

GASTROINTESTINAL TRACT: The stomach, small and large bowel are
normal in course and caliber without inflammatory changes, the
sensitivity may be decreased by lack of enteric contrast. Normal
appendix.

PERITONEUM/RETROPERITONEUM: Aortoiliac vessels are normal in course
and caliber, moderate calcific atherosclerosis. No lymphadenopathy
by CT size criteria. Internal reproductive organs are unremarkable.
No intraperitoneal free fluid nor free air. Phleboliths in the
pelvis.

SOFT TISSUES/ OSSEOUS STRUCTURES: Nonsuspicious. Moderate fat
containing RIGHT inguinal hernia. Grade 1 L4-5 anterolisthesis
without spondylolysis. Broad-based disc bulge and facet arthropathy
result in severe LEFT L4-5 neural foraminal narrowing, moderate on
the RIGHT.
IMPRESSION: Mild bilateral pelviectasis without frank hydronephrosis or
urolithiasis; this can be seen with recently passed stone or urinary
tract infection.

Chronic pancreatitis.

## 2016-07-28 ENCOUNTER — Ambulatory Visit: Payer: Medicaid Other | Admitting: Podiatry

## 2016-07-29 ENCOUNTER — Ambulatory Visit (INDEPENDENT_AMBULATORY_CARE_PROVIDER_SITE_OTHER): Payer: Medicaid Other | Admitting: Podiatry

## 2016-07-29 ENCOUNTER — Encounter: Payer: Self-pay | Admitting: Podiatry

## 2016-07-29 VITALS — BP 139/67 | HR 72 | Resp 18

## 2016-07-29 DIAGNOSIS — E11622 Type 2 diabetes mellitus with other skin ulcer: Secondary | ICD-10-CM

## 2016-07-29 DIAGNOSIS — L98499 Non-pressure chronic ulcer of skin of other sites with unspecified severity: Secondary | ICD-10-CM

## 2016-07-29 DIAGNOSIS — L97511 Non-pressure chronic ulcer of other part of right foot limited to breakdown of skin: Secondary | ICD-10-CM | POA: Diagnosis not present

## 2016-07-29 DIAGNOSIS — I739 Peripheral vascular disease, unspecified: Secondary | ICD-10-CM

## 2016-07-29 NOTE — Patient Instructions (Signed)
Apply Silvadene cream daily to the skin ulcer on the bottom of right foot and cover with gauze Continue wearing the surgical shoe on the right foot Limits standing and walking  Diabetes and Foot Care Diabetes may cause you to have problems because of poor blood supply (circulation) to your feet and legs. This may cause the skin on your feet to become thinner, break easier, and heal more slowly. Your skin may become dry, and the skin may peel and crack. You may also have nerve damage in your legs and feet causing decreased feeling in them. You may not notice minor injuries to your feet that could lead to infections or more serious problems. Taking care of your feet is one of the most important things you can do for yourself. Follow these instructions at home:  Wear shoes at all times, even in the house. Do not go barefoot. Bare feet are easily injured.  Check your feet daily for blisters, cuts, and redness. If you cannot see the bottom of your feet, use a mirror or ask someone for help.  Wash your feet with warm water (do not use hot water) and mild soap. Then pat your feet and the areas between your toes until they are completely dry. Do not soak your feet as this can dry your skin.  Apply a moisturizing lotion or petroleum jelly (that does not contain alcohol and is unscented) to the skin on your feet and to dry, brittle toenails. Do not apply lotion between your toes.  Trim your toenails straight across. Do not dig under them or around the cuticle. File the edges of your nails with an emery board or nail file.  Do not cut corns or calluses or try to remove them with medicine.  Wear clean socks or stockings every day. Make sure they are not too tight. Do not wear knee-high stockings since they may decrease blood flow to your legs.  Wear shoes that fit properly and have enough cushioning. To break in new shoes, wear them for just a few hours a day. This prevents you from injuring your feet.  Always look in your shoes before you put them on to be sure there are no objects inside.  Do not cross your legs. This may decrease the blood flow to your feet.  If you find a minor scrape, cut, or break in the skin on your feet, keep it and the skin around it clean and dry. These areas may be cleansed with mild soap and water. Do not cleanse the area with peroxide, alcohol, or iodine.  When you remove an adhesive bandage, be sure not to damage the skin around it.  If you have a wound, look at it several times a day to make sure it is healing.  Do not use heating pads or hot water bottles. They may burn your skin. If you have lost feeling in your feet or legs, you may not know it is happening until it is too late.  Make sure your health care provider performs a complete foot exam at least annually or more often if you have foot problems. Report any cuts, sores, or bruises to your health care provider immediately. Contact a health care provider if:  You have an injury that is not healing.  You have cuts or breaks in the skin.  You have an ingrown nail.  You notice redness on your legs or feet.  You feel burning or tingling in your legs or feet.  You have pain or cramps in your legs and feet.  Your legs or feet are numb.  Your feet always feel cold. Get help right away if:  There is increasing redness, swelling, or pain in or around a wound.  There is a red line that goes up your leg.  Pus is coming from a wound.  You develop a fever or as directed by your health care provider.  You notice a bad smell coming from an ulcer or wound. This information is not intended to replace advice given to you by your health care provider. Make sure you discuss any questions you have with your health care provider. Document Released: 02/07/2000 Document Revised: 07/18/2015 Document Reviewed: 07/19/2012 Elsevier Interactive Patient Education  2017 [employer].

## 2016-07-29 NOTE — Progress Notes (Signed)
Patient ID: [name], female   DOB: 08/07/1950, 67 y.o.   MRN: [id number]    Subjective: This patient presents for ongoing maintenance for pre-ulcerative or ulcerative plantar skin lesions on right and left feet. plantar fifth right MPJ as well as ongoing debridement of pre-ulcerative calluses.Patient is a known diabetic with a history of peripheral arterial disease and neuropathy Patient continue to smoke. The superficial ulcer on the visit of 06/23/2016 was 5 mm in diameter  Objective: Objective: Orientated 3 DP pulses 2/4 bilaterally PT pulses trace palpable bilaterally Capillary reflex immediate bilaterally Sensation to 10 g monofilament wire intact 5/5 bilaterally Vibratory sensation reactive bilaterally Ankle reflex equal and reactive bilaterally 2 mm superficial ulcer with granular base fifth right .The ulcer surrounded by hyperkeratotic tissue. There is no surrounding erythema, edema, drainage, warmth or malodor Pre-ulcerative plantar calluses first MPJ bilaterally and fifth left MPJ Nucleated keratoses plantar left heel  Assessment:  Diabetic with peripheral neuropathy and peripheral arterial disease Pre-ulcerative plantar calluses 3 Superficial noninfected ulcer fifth MPJ right  Plan: Debride plantar ulcer fifth MPJ right and plantar left first MPJ and apply Silvadene dressing Instructed patient apply Silvadene dressing and gauze to the fifth MPJ ulcer right and wear a surgical shoe on the right foot Debride plantar pre-ulcerative calluses 3 without anybleeding  Reappoint 14 days

## 2016-08-03 ENCOUNTER — Other Ambulatory Visit: Payer: Self-pay | Admitting: Internal Medicine

## 2016-08-06 ENCOUNTER — Ambulatory Visit: Payer: Medicaid Other

## 2016-08-06 ENCOUNTER — Telehealth: Payer: Self-pay | Admitting: *Deleted

## 2016-08-06 NOTE — Telephone Encounter (Signed)
Pt left message stating that she previously had to cancel an appt due to death in her family. The appt has been rescheduled but she is having a lot of pain and does not feel she can wait that long until the appt. Please call back.

## 2016-08-07 ENCOUNTER — Inpatient Hospital Stay (HOSPITAL_COMMUNITY)
Admission: AD | Admit: 2016-08-07 | Discharge: 2016-08-07 | Disposition: A | Discharge status: Home / Self Care | Payer: Medicaid Other | Source: Ambulatory Visit | Attending: Obstetrics and Gynecology | Admitting: Obstetrics and Gynecology

## 2016-08-07 ENCOUNTER — Encounter (HOSPITAL_COMMUNITY): Payer: Self-pay

## 2016-08-07 DIAGNOSIS — R1012 Left upper quadrant pain: Secondary | ICD-10-CM | POA: Diagnosis not present

## 2016-08-07 DIAGNOSIS — E785 Hyperlipidemia, unspecified: Secondary | ICD-10-CM | POA: Insufficient documentation

## 2016-08-07 DIAGNOSIS — Z9889 Other specified postprocedural states: Secondary | ICD-10-CM | POA: Insufficient documentation

## 2016-08-07 DIAGNOSIS — R61 Generalized hyperhidrosis: Secondary | ICD-10-CM | POA: Insufficient documentation

## 2016-08-07 DIAGNOSIS — Z87442 Personal history of urinary calculi: Secondary | ICD-10-CM | POA: Diagnosis not present

## 2016-08-07 DIAGNOSIS — I10 Essential (primary) hypertension: Secondary | ICD-10-CM | POA: Insufficient documentation

## 2016-08-07 DIAGNOSIS — F17210 Nicotine dependence, cigarettes, uncomplicated: Secondary | ICD-10-CM | POA: Diagnosis not present

## 2016-08-07 DIAGNOSIS — R5383 Other fatigue: Secondary | ICD-10-CM | POA: Diagnosis not present

## 2016-08-07 DIAGNOSIS — E1142 Type 2 diabetes mellitus with diabetic polyneuropathy: Secondary | ICD-10-CM | POA: Diagnosis not present

## 2016-08-07 LAB — URINALYSIS, ROUTINE W REFLEX MICROSCOPIC
BILIRUBIN URINE: NEGATIVE
GLUCOSE, UA: NEGATIVE mg/dL
HGB URINE DIPSTICK: NEGATIVE
Ketones, ur: NEGATIVE mg/dL
NITRITE: NEGATIVE
PH: 5 (ref 5.0–8.0)
Protein, ur: 30 mg/dL — AB
Specific Gravity, Urine: 1.014 (ref 1.005–1.030)

## 2016-08-07 LAB — CBC WITH DIFFERENTIAL/PLATELET
Basophils Absolute: 0 10*3/uL (ref 0.0–0.1)
Basophils Relative: 0 %
Eosinophils Absolute: 0.2 10*3/uL (ref 0.0–0.7)
Eosinophils Relative: 4 %
HCT: 34.2 % — ABNORMAL LOW (ref 36.0–46.0)
Hemoglobin: 11.7 g/dL — ABNORMAL LOW (ref 12.0–15.0)
Lymphocytes Relative: 36 %
Lymphs Abs: 1.5 10*3/uL (ref 0.7–4.0)
MCH: 30.4 pg (ref 26.0–34.0)
MCHC: 34.2 g/dL (ref 30.0–36.0)
MCV: 88.8 fL (ref 78.0–100.0)
Monocytes Absolute: 0.2 10*3/uL (ref 0.1–1.0)
Monocytes Relative: 4 %
Neutro Abs: 2.3 10*3/uL (ref 1.7–7.7)
Neutrophils Relative %: 56 %
Platelets: 130 10*3/uL — ABNORMAL LOW (ref 150–400)
RBC: 3.85 MIL/uL — ABNORMAL LOW (ref 3.87–5.11)
RDW: 14.4 % (ref 11.5–15.5)
WBC: 4 10*3/uL (ref 4.0–10.5)

## 2016-08-07 NOTE — [facility]U Provider Note (Signed)
Chief Complaint:  Abdominal Pain  [name] is a 66 y.o. [id number]. She presents with left-sided upper-mid abdominal pain for several weeks. The pain is located to the side (midaxillary), has not migrated, and does not radiate. It began as "pins" and is now sharp and constant. She is concerned that the pain may be due to a D&C ( Dr. Elly Modena) procedure she had in April. She missed her follow-up appointment for the results due to her sister passing (pathology results show that a benign endometrial polyp was removed). Patient also had a colonoscopy in May; she says the results were normal. Over the past month she states she has lost 7 pounds; she endorses "complete lack of energy," fatigue, and night sweats. She has maintained a good appetite. Has tried "a lot of pain medication" but nothing has helped. Denies vaginal bleeding, feeling bloated, early satiety, increased urinary frequency, burning with urination, or diarrhea; endorses constipation but says this is not new for her.   Obstetrical/Gynecological History: G3P3 No hx STI Breast surgery (infection after trauma ~66 years old) Endometrial polypectomy 05/2016 (patient was unaware of this)  Past Medical History: Past Medical History:  Diagnosis Date  . ANEMIA, CHRONIC DISEASE NEC 03/15/2006  . Constipation due to opioid therapy 06/05/2014  . DEGENERATIVE JOINT DISEASE 05/06/2007   On going for >5 yrs No imaging to confirm Has tried ultram, mobic, neurontin which did not work Ambulance person tried once worked well    . Diabetes mellitus   . DIABETIC  RETINOPATHY 03/15/2006  . 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 Diastolic dysfunction 03/47/4259   Grade 1 by Echocardiogram 12/13/14  . Heart murmur   . Hemorrhoids   . History of kidney stones   . Hyperlipidemia 04/01/2011  . Hypertension   . Moderate aortic regurgitation 09/25/2008   12/13/14 Echocardiogram    . PANCREATITIS, CHRONIC 03/29/2006  . Peripheral arterial disease ([place])   . Pneumonia     age [age]  . RENAL INSUFFICIENCY, CHRONIC 03/15/2006    Past Surgical History: Past Surgical History:  Procedure Laterality Date  . BREAST SURGERY    . COLONOSCOPY    . DILATION AND CURETTAGE OF UTERUS N/A 06/16/2016   Procedure: DILATATION AND CURETTAGE;  Surgeon: Mora Bellman, MD;  Location: [facility];  Service: Gynecology;  Laterality: N/A;  . HYSTEROSCOPY N/A 06/16/2016   Procedure: HYSTEROSCOPY;  Surgeon: Mora Bellman, MD;  Location: [facility];  Service: Gynecology;  Laterality: N/A;  . MOUTH SURGERY N/A   . POLYPECTOMY N/A 06/16/2016   Procedure: POLYPECTOMY;  Surgeon: Mora Bellman, MD;  Location: [facility];  Service: Gynecology;  Laterality: N/A;    Family History: Family History  Problem Relation Age of Onset  . Dementia Mother     Social History: Social History  Substance Use Topics  . Smoking status: Current Some Day Smoker    Packs/day: 0.50    Years: 44.00    Types: Cigarettes  . Smokeless tobacco: Never Used  . Alcohol use 0.0 oz/week     Comment: occ    Allergies: Allergies  Allergen Reactions  . Gabapentin Swelling    Legs and feet  . Nicorette [Nicotine] Nausea Only    Current Medications: No prescriptions prior to admission.    Review of Systems (+) left-sided upper abdominal pain; fatigue; night sweats; weight loss; constipation (-) anorexia; early satiety; urinary sx; diarrhea; headache; cough   Physical Exam   BP (!) 149/61 (BP Location: Left Arm)   Pulse 62   Temp  97.8 F (36.6 C) (Oral)   Resp 18   Wt 127 lb 8 oz (57.8 kg)   LMP 02/22/2016   SpO2 100%   BMI 21.22 kg/m   General: General appearance - alert, well-appearing, and in no acute distress Chest - CTA bilaterally; no wheezes, rales, crackles, rhonchi; symmetric air entry  Heart - RRR; soft diastolic murmur Abdomen - soft, non-distended; non-tender to palpation in all quadrants; +BS x4; no masses or organomegaly No CVA tenderness  Focused Gynecological Exam: normal  external genitalia, vulva, vagina, cervix, uterus, and adnexa Behavior is wnl  Labs: Results for orders placed or performed during the hospital encounter of 08/07/16 (from the past 24 hour(s))  Urinalysis, Routine w reflex microscopic   Collection Time: 08/07/16  8:40 AM  Result Value Ref Range   Color, Urine YELLOW YELLOW   APPearance CLEAR CLEAR   Specific Gravity, Urine 1.014 1.005 - 1.030   pH 5.0 5.0 - 8.0   Glucose, UA NEGATIVE NEGATIVE mg/dL   Hgb urine dipstick NEGATIVE NEGATIVE   Bilirubin Urine NEGATIVE NEGATIVE   Ketones, ur NEGATIVE NEGATIVE mg/dL   Protein, ur 30 (A) NEGATIVE mg/dL   Nitrite NEGATIVE NEGATIVE   Leukocytes, UA LARGE (A) NEGATIVE   RBC / HPF 0-5 0 - 5 RBC/hpf   WBC, UA 0-5 0 - 5 WBC/hpf   Bacteria, UA RARE (A) NONE SEEN   Squamous Epithelial / LPF 0-5 (A) NONE SEEN   Mucous PRESENT   CBC with Differential/Platelet   Collection Time: 08/07/16 11:02 AM  Result Value Ref Range   WBC 4.0 4.0 - 10.5 K/uL   RBC 3.85 (L) 3.87 - 5.11 MIL/uL   Hemoglobin 11.7 (L) 12.0 - 15.0 g/dL   HCT 34.2 (L) 36.0 - 46.0 %   MCV 88.8 78.0 - 100.0 fL   MCH 30.4 26.0 - 34.0 pg   MCHC 34.2 30.0 - 36.0 g/dL   RDW 14.4 11.5 - 15.5 %   Platelets 130 (L) 150 - 400 K/uL   Neutrophils Relative % 56 %   Neutro Abs 2.3 1.7 - 7.7 K/uL   Lymphocytes Relative 36 %   Lymphs Abs 1.5 0.7 - 4.0 K/uL   Monocytes Relative 4 %   Monocytes Absolute 0.2 0.1 - 1.0 K/uL   Eosinophils Relative 4 %   Eosinophils Absolute 0.2 0.0 - 0.7 K/uL   Basophils Relative 0 %   Basophils Absolute 0.0 0.0 - 0.1 K/uL    Imaging Studies:  No results found.   MDM MSE Labs Exam  Assessment: Left upper quadrant pain - Plan: Discharge patient  Plan:   Discussed exam and lab finding with the patient.  Neg for GYN findings, normal WBC - Discharge patient in stable condition with return precautions  - Recommend follow-up appointment with outpatient PCP and with Dr. Elly Modena  I confirm that I have  verified the information documented in the Medical Student 3rd year's note and that I have also personally performed the physical exam and all medical decision making activities. Lurlean Leyden, MS3     Eve Gillis Santa, RN FNP

## 2016-08-07 NOTE — Discharge Instructions (Signed)

## 2016-08-07 NOTE — MAU Note (Signed)
Urine in lab 

## 2016-08-07 NOTE — MAU Note (Signed)
Been having pain in abd and sides, all this week.  Gotten so severe. Pain comes and goes, is getting worse.  "has lost wt with it".  The medications she take, causes constipation- so she takes a laxative for that. No urinary symptoms.

## 2016-08-07 NOTE — Telephone Encounter (Signed)
Patient has come to MAU today to address her concerns.

## 2016-08-10 ENCOUNTER — Ambulatory Visit: Payer: Medicaid Other

## 2016-08-10 ENCOUNTER — Encounter: Payer: Self-pay | Admitting: Internal Medicine

## 2016-08-11 ENCOUNTER — Emergency Department (HOSPITAL_COMMUNITY)
Admission: EM | Admit: 2016-08-11 | Discharge: 2016-08-11 | Disposition: A | Discharge status: Home / Self Care | Payer: Medicaid Other | Attending: Emergency Medicine | Admitting: Emergency Medicine

## 2016-08-11 ENCOUNTER — Emergency Department (HOSPITAL_COMMUNITY): Payer: Medicaid Other

## 2016-08-11 ENCOUNTER — Encounter (HOSPITAL_COMMUNITY): Payer: Self-pay | Admitting: Emergency Medicine

## 2016-08-11 DIAGNOSIS — E1140 Type 2 diabetes mellitus with diabetic neuropathy, unspecified: Secondary | ICD-10-CM | POA: Diagnosis not present

## 2016-08-11 DIAGNOSIS — I129 Hypertensive chronic kidney disease with stage 1 through stage 4 chronic kidney disease, or unspecified chronic kidney disease: Secondary | ICD-10-CM | POA: Insufficient documentation

## 2016-08-11 DIAGNOSIS — R109 Unspecified abdominal pain: Secondary | ICD-10-CM

## 2016-08-11 DIAGNOSIS — Z79899 Other long term (current) drug therapy: Secondary | ICD-10-CM | POA: Diagnosis not present

## 2016-08-11 DIAGNOSIS — E1122 Type 2 diabetes mellitus with diabetic chronic kidney disease: Secondary | ICD-10-CM | POA: Diagnosis not present

## 2016-08-11 DIAGNOSIS — E11319 Type 2 diabetes mellitus with unspecified diabetic retinopathy without macular edema: Secondary | ICD-10-CM | POA: Diagnosis not present

## 2016-08-11 DIAGNOSIS — N183 Chronic kidney disease, stage 3 (moderate): Secondary | ICD-10-CM | POA: Diagnosis not present

## 2016-08-11 DIAGNOSIS — Z7982 Long term (current) use of aspirin: Secondary | ICD-10-CM | POA: Diagnosis not present

## 2016-08-11 DIAGNOSIS — R1032 Left lower quadrant pain: Secondary | ICD-10-CM | POA: Insufficient documentation

## 2016-08-11 DIAGNOSIS — Z7984 Long term (current) use of oral hypoglycemic drugs: Secondary | ICD-10-CM | POA: Insufficient documentation

## 2016-08-11 DIAGNOSIS — F17210 Nicotine dependence, cigarettes, uncomplicated: Secondary | ICD-10-CM | POA: Diagnosis not present

## 2016-08-11 LAB — URINALYSIS, ROUTINE W REFLEX MICROSCOPIC
BILIRUBIN URINE: NEGATIVE
GLUCOSE, UA: NEGATIVE mg/dL
Hgb urine dipstick: NEGATIVE
Ketones, ur: NEGATIVE mg/dL
NITRITE: NEGATIVE
PH: 5 (ref 5.0–8.0)
Protein, ur: 30 mg/dL — AB
Specific Gravity, Urine: 1.012 (ref 1.005–1.030)

## 2016-08-11 LAB — CBC
HEMATOCRIT: 32.6 % — AB (ref 36.0–46.0)
Hemoglobin: 11.2 g/dL — ABNORMAL LOW (ref 12.0–15.0)
MCH: 30 pg (ref 26.0–34.0)
MCHC: 34.4 g/dL (ref 30.0–36.0)
MCV: 87.4 fL (ref 78.0–100.0)
Platelets: 153 10*3/uL (ref 150–400)
RBC: 3.73 MIL/uL — AB (ref 3.87–5.11)
RDW: 13.8 % (ref 11.5–15.5)
WBC: 3.8 10*3/uL — ABNORMAL LOW (ref 4.0–10.5)

## 2016-08-11 LAB — COMPREHENSIVE METABOLIC PANEL
ALK PHOS: 93 U/L (ref 38–126)
ALT: 68 U/L — ABNORMAL HIGH (ref 14–54)
ANION GAP: 8 (ref 5–15)
AST: 60 U/L — ABNORMAL HIGH (ref 15–41)
Albumin: 3.9 g/dL (ref 3.5–5.0)
BUN: 36 mg/dL — ABNORMAL HIGH (ref 6–20)
CO2: 21 mmol/L — AB (ref 22–32)
Calcium: 9.1 mg/dL (ref 8.9–10.3)
Chloride: 105 mmol/L (ref 101–111)
Creatinine, Ser: 1.36 mg/dL — ABNORMAL HIGH (ref 0.44–1.00)
GFR calc non Af Amer: 40 mL/min — ABNORMAL LOW (ref >60)
GFR, EST AFRICAN AMERICAN: 47 mL/min — AB (ref >60)
Glucose, Bld: 197 mg/dL — ABNORMAL HIGH (ref 65–99)
POTASSIUM: 4.3 mmol/L (ref 3.5–5.1)
SODIUM: 134 mmol/L — AB (ref 135–145)
TOTAL PROTEIN: 7 g/dL (ref 6.5–8.1)
Total Bilirubin: 0.7 mg/dL (ref 0.3–1.2)

## 2016-08-11 LAB — LIPASE, BLOOD: Lipase: 19 U/L (ref 11–51)

## 2016-08-11 NOTE — Discharge Instructions (Signed)
As we discussed, your CT scan did show some stones in your left kidney, but they should not be causing her problems currently. There is a lot of stool noted in your colon. Recommend miralax, 2 cap fulls/packets daily for the next few days until bowel movements regulate and can then decrease to 1 cap-full/packet daily. Follow-up with your primary care doctor. Return here for any new/acute problems.

## 2016-08-11 NOTE — ED Notes (Signed)
Patient transported to CT 

## 2016-08-11 NOTE — ED Triage Notes (Signed)
Pt sts left sided upper back and flank pain x 3 days

## 2016-08-11 NOTE — ED Provider Notes (Signed)
,  [facility] DEPT Provider Note   CSN: [id number] Arrival date & time: 08/11/16  1503  By signing my name below, I, Reola Mosher, attest that this documentation has been prepared under the direction and in the presence of Quincy Carnes, PA-C.  Electronically Signed: Reola Mosher, ED Scribe. 08/11/16. 6:49 PM.  History   Chief Complaint Chief Complaint  Patient presents with  . Flank Pain   The history is provided by the patient. No language interpreter was used.    HPI Comments: [name] is a 66 y.o. female who presents to the Emergency Department complaining of intermittent, sharp left sided flank pain beginning three days ago, acutely worsening today. No radiation of pain and her pain has nearly resolved while in the ED. No recent new injury or trauma to the area. No noted treatments for her pain were tried prior to coming into the ED. Pt has a h/o renal calculi and she states that her pain today is somewhat similar to this. Denies urgency, frequency, hematuria, dysuria, difficulty urinating, nausea, vomiting, anorexia, fever, or any other associated symptoms.   Past Medical History:  Diagnosis Date  . ANEMIA, CHRONIC DISEASE NEC 03/15/2006  . Constipation due to opioid therapy 06/05/2014  . DEGENERATIVE JOINT DISEASE 05/06/2007   On going for >5 yrs No imaging to confirm Has tried ultram, mobic, neurontin which did not work Ambulance person tried once worked well    . Diabetes mellitus   . DIABETIC  RETINOPATHY 03/15/2006  . 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 Diastolic dysfunction 47/65/4650   Grade 1 by Echocardiogram 12/13/14  . Heart murmur   . Hemorrhoids   . History of kidney stones   . Hyperlipidemia 04/01/2011  . Hypertension   . Moderate aortic regurgitation 09/25/2008   12/13/14 Echocardiogram    . PANCREATITIS, CHRONIC 03/29/2006  . Peripheral arterial disease ([place])   . Pneumonia    age 31-20  . RENAL INSUFFICIENCY, CHRONIC 03/15/2006   Patient  Active Problem List   Diagnosis Date Noted  . Tubular adenoma of colon 06/30/2016  . Diverticulosis of colon without hemorrhage 06/30/2016  . Postmenopausal vaginal bleeding   . Long term (current) use of opiate analgesic 04/09/2016  . Bilateral carotid artery disease ([place]) 11/28/2015  . Pancreatic insufficiency 05/09/2015  . 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 Risk for coronary artery disease greater than 20% in next 10 years 03/14/2015  . Diastolic dysfunction 35/46/5681  . Vulvar cyst 01/23/2015  . Abnormal uterine bleeding 12/02/2014  . Unexplained night sweats 12/02/2014  . Constipation due to opioid therapy 06/05/2014  . Loss of weight 03/29/2014  . Peripheral arterial disease ([place]) 12/12/2013  . GERD (gastroesophageal reflux disease) 10/12/2012  . Healthcare maintenance 09/29/2012  . Back pain 03/28/2012  . Hyperlipidemia 04/01/2011  . BOILS, RECURRENT 03/13/2010  . Moderate aortic regurgitation 09/25/2008  . HEMORRHOIDS 06/08/2008  . Osteoarthritis 05/06/2007  . TOBACCO ABUSE 09/27/2006  . History of osteoporosis 06/29/2006  . Chronic pancreatitis ([place]) 03/29/2006  . Uncontrolled type 2 diabetes mellitus with chronic kidney disease, without long-term current use of insulin ([place]) 03/15/2006  . Diabetic neuropathy, painful ([place]) 03/15/2006  . Anemia of chronic disease 03/15/2006  . Essential hypertension 03/15/2006  . VENTRICULAR HYPERTROPHY, LEFT 03/15/2006  . CKD stage 3 due to type 2 diabetes mellitus ([place]) 03/15/2006   Past Surgical History:  Procedure Laterality Date  . BREAST SURGERY    . COLONOSCOPY    . DILATION AND CURETTAGE OF  UTERUS N/A 06/16/2016   Procedure: DILATATION AND CURETTAGE;  Surgeon: Mora Bellman, MD;  Location: [facility];  Service: Gynecology;  Laterality: N/A;  . HYSTEROSCOPY N/A 06/16/2016   Procedure: HYSTEROSCOPY;  Surgeon: Mora Bellman, MD;  Location: [facility];  Service: Gynecology;  Laterality: N/A;  . MOUTH SURGERY N/A   .  POLYPECTOMY N/A 06/16/2016   Procedure: POLYPECTOMY;  Surgeon: Mora Bellman, MD;  Location: [facility];  Service: Gynecology;  Laterality: N/A;   OB History    Gravida Para Term Preterm AB Living   3 3 3     3    SAB TAB Ectopic Multiple Live Births           3     Home Medications    Prior to Admission medications   Medication Sig Start Date End Date Taking? Authorizing Provider  ACCU-CHEK AVIVA PLUS test strip TEST three times a day 11/25/15   Lucious Groves, DO  amLODipine (NORVASC) 5 MG tablet take 1 tablet by mouth once daily 08/03/16   Lucious Groves, DO  aspirin EC 81 MG tablet Take 1 tablet (81 mg total) by mouth daily. 04/09/16   Lucious Groves, DO  atorvastatin (LIPITOR) 40 MG tablet Take 1 tablet (40 mg total) by mouth daily. 03/25/16 03/25/17  Lucious Groves, DO  Blood Glucose Monitoring Suppl (ACCU-CHEK AVIVA PLUS) w/Device KIT Use to check you blood sugar 3 times a day 06/04/15   Oval Linsey, MD  docusate sodium (COLACE) 100 MG capsule Take 1 capsule (100 mg total) by mouth 2 (two) times daily as needed. 06/16/16   Constant, Peggy, MD  ibuprofen (ADVIL,MOTRIN) 600 MG tablet Take 600 mg by mouth every 6 (six) hours as needed for headache or moderate pain.    [provider]  losartan (COZAAR) 100 MG tablet Take 1 tablet (100 mg total) by mouth daily. 01/24/16   Lucious Groves, DO  metFORMIN (GLUCOPHAGE-XR) 500 MG 24 hr tablet Take 1 tablet (500 mg total) by mouth daily with breakfast. 04/10/16   Lucious Groves, DO  Multiple Vitamin (MULTIVITAMIN WITH MINERALS) TABS tablet Take 1 tablet by mouth daily. One-a-Day Women's 50+    [provider]  Pancrelipase, Lip-Prot-Amyl, 24000-76000 units CPEP Take 4 capsules (96,000 Units total) by mouth See admin instructions. Take 4 capsules by mouth 2-3 times daily before meals Patient taking differently: Take 72,000-96,000 Units by mouth See admin instructions. Pt takes before meals. 03/10/16   Lucious Groves, DO    polyethylene glycol (MIRALAX / GLYCOLAX) packet Take 17 g by mouth daily as needed for mild constipation. 10/24/15   Oval Linsey, MD  silver sulfADIAZINE (SILVADENE) 1 % cream Apply 1 application topically daily. Patient taking differently: Apply 1 application topically daily as needed. Applied to patient feet for wounds 02/25/16   Tuchman, Richard C, DPM  sitaGLIPtin (JANUVIA) 50 MG tablet Take 1 tablet (50 mg total) by mouth daily. 03/10/16   Lucious Groves, DO   Family History Family History  Problem Relation Age of Onset  . Dementia Mother    Social History Social History  Substance Use Topics  . Smoking status: Current Some Day Smoker    Packs/day: 0.50    Years: 44.00    Types: Cigarettes  . Smokeless tobacco: Never Used  . Alcohol use 0.0 oz/week     Comment: occ   Allergies   Gabapentin and Nicorette [nicotine]  Review of Systems Review of Systems  Constitutional: Negative for appetite  change and fever.  Gastrointestinal: Negative for abdominal pain, nausea and vomiting.  Genitourinary: Positive for flank pain. Negative for difficulty urinating, dysuria, frequency, hematuria and urgency.  Musculoskeletal: Positive for back pain.  All other systems reviewed and are negative.  Physical Exam Updated Vital Signs BP (!) 137/55 (BP Location: Left Arm)   Pulse (!) 56   Temp 98.3 F (36.8 C) (Oral)   Resp 15   LMP 02/22/2016   SpO2 100%   Physical Exam  Constitutional: She is oriented to person, place, and time. She appears well-developed and well-nourished. No distress.  HENT:  Head: Normocephalic and atraumatic.  Mouth/Throat: Oropharynx is clear and moist.  Eyes: Conjunctivae and EOM are normal. Pupils are equal, round, and reactive to light.  Neck: Normal range of motion.  Cardiovascular: Normal rate, regular rhythm and normal heart sounds.   Pulmonary/Chest: Effort normal and breath sounds normal.  Abdominal: Soft. Bowel sounds are normal. She exhibits no  distension.  Musculoskeletal: Normal range of motion.  Neurological: She is alert and oriented to person, place, and time.  Skin: Skin is warm and dry. No pallor.  Psychiatric: She has a normal mood and affect. Her behavior is normal.  Nursing note and vitals reviewed.  ED Treatments / Results  DIAGNOSTIC STUDIES: Oxygen Saturation is 100% on RA, normal by my interpretation.   COORDINATION OF CARE: 6:46 PM-Discussed next steps with pt. Pt verbalized understanding and is agreeable with the plan.   Labs (all labs ordered are listed, but only abnormal results are displayed) Labs Reviewed  COMPREHENSIVE METABOLIC PANEL - Abnormal; Notable for the following:       Result Value   Sodium 134 (*)    CO2 21 (*)    Glucose, Bld 197 (*)    BUN 36 (*)    Creatinine, Ser 1.36 (*)    AST 60 (*)    ALT 68 (*)    GFR calc non Af Amer 40 (*)    GFR calc Af Amer 47 (*)    All other components within normal limits  CBC - Abnormal; Notable for the following:    WBC 3.8 (*)    RBC 3.73 (*)    Hemoglobin 11.2 (*)    HCT 32.6 (*)    All other components within normal limits  URINALYSIS, ROUTINE W REFLEX MICROSCOPIC - Abnormal; Notable for the following:    APPearance HAZY (*)    Protein, ur 30 (*)    Leukocytes, UA MODERATE (*)    Bacteria, UA RARE (*)    Squamous Epithelial / LPF 0-5 (*)    All other components within normal limits  LIPASE, BLOOD   EKG  EKG Interpretation None      Radiology Ct Renal Stone Study  Result Date: 08/11/2016 CLINICAL DATA:  66 year old female who with left-sided flank pain. History of kidney stones. EXAM: CT ABDOMEN AND PELVIS WITHOUT CONTRAST TECHNIQUE: Multidetector CT imaging of the abdomen and pelvis was performed following the standard protocol without IV contrast. COMPARISON:  CT of the abdomen pelvis dated 11/06/2014 FINDINGS: Evaluation of this exam is limited in the absence of intravenous contrast. Lower chest: The visualized lung bases are  clear. No intra-abdominal free air or free fluid. Hepatobiliary: Cholecystectomy. The liver is unremarkable. No intrahepatic biliary ductal dilatation. Pancreas: Scattered coarse calcification of the pancreas, likely sequela of chronic pancreatitis. No active inflammatory changes of the pancreas noted. No significant dilatation of the main pancreatic duct. Spleen: Normal in size without focal abnormality.  Adrenals/Urinary Tract: The adrenal glands are unremarkable. Faint punctate densities in the left kidney may be artifactual or represent small nonobstructing calculi. There is no hydronephrosis or nephrolithiasis on either side. There is mild stranding of the right perinephric fat. Correlation with urinalysis recommended to exclude UTI. The urinary bladder is partially distended and appears unremarkable. Stomach/Bowel: There is moderate stool throughout the colon. There is no evidence of bowel obstruction or active inflammation. The visualized appendix appears unremarkable. Vascular/Lymphatic: There is moderate aortoiliac atherosclerotic disease. Evaluation of the vasculature is limited in the absence of intravenous contrast. No portal venous gas identified. There is no adenopathy. Reproductive: The uterus is retroverted or retroflexed. There is thickened appearance of the endometrium measuring up to 19 mm. Similar findings were not seen on the ultrasound of 04/18/2016 and evaluation of the endometrium with hysteroscopy was recommended. Small uterine fibroids noted. The ovaries are not visualized with certainty. No pelvic mass. Other: Small fat containing right inguinal hernia. Musculoskeletal: There is degenerative changes of the spine. Grade 1 L4-L5 anterolisthesis. There is disc desiccation with vacuum phenomena at L4-5. No acute fracture. IMPRESSION: 1. No hydronephrosis or obstructing stone. Faint punctate left renal densities may be artifactual or represent nonobstructing calculi. 2. Moderate colonic stool  burden. No bowel obstruction or active inflammation. Normal appendix. 3. Thickened endometrium as seen on the ultrasound of 04/18/2016. Further evaluation with hysteroscopy, if not already performed, recommended. Electronically Signed   By: Anner Crete M.D.   On: 08/11/2016 19:32    Procedures Procedures   Medications Ordered in ED Medications - No data to display  Initial Impression / Assessment and Plan / ED Course  I have reviewed the triage vital signs and the nursing notes.  Pertinent labs & imaging results that were available during my care of the patient were reviewed by me and considered in my medical decision making (see chart for details).  66 year old female here with left flank pain.  Does report history of kidney stones. She is afebrile and nontoxic. Abdomen is soft and benign. No CVA tenderness noted on exam. UA with moderate leuks but rare bacteria. Patient denies any urinary symptoms. Labwork is overall reassuring. CT renal study obtained revealing left punctate renal calculi but no obstructive stones. There is a thickened endometrium, patient has already seen her OB/GYN for this. There is a large stool burden. Patient does report some constipation issues recently, she is supposed to take daily stool softeners but has been forgetting this recently. This may be the etiology of her pain. Recommended MiraLAX, 2 scoops daily for next few days, can reduce to 1 scoop once bowel movements regulate. Follow-up with PCP.  Discussed plan with patient, she acknowledged understanding and agreed with plan of care.  Return precautions given for new or worsening symptoms.  Final Clinical Impressions(s) / ED Diagnoses   Final diagnoses:  Flank pain   New Prescriptions New Prescriptions   No medications on file   I personally performed the services described in this documentation, which was scribed in my presence. The recorded information has been reviewed and is accurate.    Larene Pickett, PA-C 08/11/16 2032    Daleen Bo, MD 08/11/16 [phone]

## 2016-08-12 ENCOUNTER — Other Ambulatory Visit: Payer: Self-pay | Admitting: Internal Medicine

## 2016-08-12 ENCOUNTER — Other Ambulatory Visit: Payer: Self-pay

## 2016-08-12 DIAGNOSIS — I10 Essential (primary) hypertension: Secondary | ICD-10-CM

## 2016-08-12 MED ORDER — LOSARTAN POTASSIUM 100 MG PO TABS: 100.0000 mg | ORAL_TABLET | Freq: Every day | ORAL | 1 refills | Status: DC

## 2016-08-12 NOTE — Telephone Encounter (Signed)
Pt picked up amlodipine 6/11, losartan is ready for pick up now, called pt and informed her

## 2016-08-12 NOTE — Telephone Encounter (Signed)
NEEDS REFILL ON B/P MEDS, [place]

## 2016-08-12 NOTE — Telephone Encounter (Signed)
losartan (COZAAR) 100 MG tablet, refill request @ [facility] on [address].

## 2016-08-14 ENCOUNTER — Encounter: Payer: Self-pay | Admitting: Internal Medicine

## 2016-08-19 ENCOUNTER — Encounter: Payer: Self-pay | Admitting: Podiatry

## 2016-08-19 ENCOUNTER — Ambulatory Visit (INDEPENDENT_AMBULATORY_CARE_PROVIDER_SITE_OTHER): Payer: Medicaid Other | Admitting: Podiatry

## 2016-08-19 VITALS — BP 140/74 | HR 70 | Resp 18

## 2016-08-19 DIAGNOSIS — L98499 Non-pressure chronic ulcer of skin of other sites with unspecified severity: Secondary | ICD-10-CM | POA: Diagnosis not present

## 2016-08-19 DIAGNOSIS — M79676 Pain in unspecified toe(s): Secondary | ICD-10-CM | POA: Diagnosis not present

## 2016-08-19 DIAGNOSIS — E11622 Type 2 diabetes mellitus with other skin ulcer: Secondary | ICD-10-CM

## 2016-08-19 DIAGNOSIS — B351 Tinea unguium: Secondary | ICD-10-CM

## 2016-08-19 DIAGNOSIS — I739 Peripheral vascular disease, unspecified: Secondary | ICD-10-CM

## 2016-08-19 NOTE — Progress Notes (Signed)
Patient ID: [name], female   DOB: Sep 24, 1950, 66 y.o.   MRN: [id number]    Subjective: This patient presents for ongoing maintenance for pre-ulcerative or ulcerative plantar skin lesions on right and left feet. plantar fifth right MPJ as well as ongoing debridement of pre-ulcerative calluses.Patient is a known diabetic with a history of peripheral arterial disease and neuropathy Patient continue to smoke. The superficial ulcer on the visit of 06/23/2016 was 5 mm in diameter  Objective: Objective: Orientated 3 DP pulses 2/4 bilaterally PT pulses trace palpable bilaterally Capillary reflex immediate bilaterally Sensation to 10 g monofilament wire intact 5/5 bilaterally Vibratory sensation reactive bilaterally Ankle reflex equal and reactive bilaterally 5 mm superficial ulcer with granular base fifth right .The ulcer surrounded by hyperkeratotic tissue. There is no surrounding erythema, edema, drainage, warmth or malodor Pre-ulcerative plantar calluses first MPJ bilaterally and fifth left MPJ Nucleated keratoses plantar left heel Hypertrophic elongated, deformed toenails 10 with palpable tenderness  Assessment: Diabetic with peripheral neuropathy and peripheral arterial disease Pre-ulcerative plantar calluses 3 Superficial noninfected ulcer fifth MPJ right Mycotic toenails 6-10  Plan: Debride plantar ulcer fifth MPJ right and plantar left first MPJ and apply Silvadene dressing Instructed patient apply Silvadene dressing and gauze to the fifth MPJ ulcer right and wear a surgical shoe on the right foot Debride plantar pre-ulcerative calluses 3 without anybleeding Debrided toenails 6-10 mechanically and electrically without any bleeding  Reappoint 2 weeks  Reappoint 14 days

## 2016-08-19 NOTE — Patient Instructions (Signed)
Continue wearing surgical shoe on the right foot Apply Silvadene cream to skin ulcer on the right foot if drainage is noted   Diabetes and Foot Care Diabetes may cause you to have problems because of poor blood supply (circulation) to your feet and legs. This may cause the skin on your feet to become thinner, break easier, and heal more slowly. Your skin may become dry, and the skin may peel and crack. You may also have nerve damage in your legs and feet causing decreased feeling in them. You may not notice minor injuries to your feet that could lead to infections or more serious problems. Taking care of your feet is one of the most important things you can do for yourself. Follow these instructions at home:  Wear shoes at all times, even in the house. Do not go barefoot. Bare feet are easily injured.  Check your feet daily for blisters, cuts, and redness. If you cannot see the bottom of your feet, use a mirror or ask someone for help.  Wash your feet with warm water (do not use hot water) and mild soap. Then pat your feet and the areas between your toes until they are completely dry. Do not soak your feet as this can dry your skin.  Apply a moisturizing lotion or petroleum jelly (that does not contain alcohol and is unscented) to the skin on your feet and to dry, brittle toenails. Do not apply lotion between your toes.  Trim your toenails straight across. Do not dig under them or around the cuticle. File the edges of your nails with an emery board or nail file.  Do not cut corns or calluses or try to remove them with medicine.  Wear clean socks or stockings every day. Make sure they are not too tight. Do not wear knee-high stockings since they may decrease blood flow to your legs.  Wear shoes that fit properly and have enough cushioning. To break in new shoes, wear them for just a few hours a day. This prevents you from injuring your feet. Always look in your shoes before you put them on to be  sure there are no objects inside.  Do not cross your legs. This may decrease the blood flow to your feet.  If you find a minor scrape, cut, or break in the skin on your feet, keep it and the skin around it clean and dry. These areas may be cleansed with mild soap and water. Do not cleanse the area with peroxide, alcohol, or iodine.  When you remove an adhesive bandage, be sure not to damage the skin around it.  If you have a wound, look at it several times a day to make sure it is healing.  Do not use heating pads or hot water bottles. They may burn your skin. If you have lost feeling in your feet or legs, you may not know it is happening until it is too late.  Make sure your health care provider performs a complete foot exam at least annually or more often if you have foot problems. Report any cuts, sores, or bruises to your health care provider immediately. Contact a health care provider if:  You have an injury that is not healing.  You have cuts or breaks in the skin.  You have an ingrown nail.  You notice redness on your legs or feet.  You feel burning or tingling in your legs or feet.  You have pain or cramps in your legs  and feet.  Your legs or feet are numb.  Your feet always feel cold. Get help right away if:  There is increasing redness, swelling, or pain in or around a wound.  There is a red line that goes up your leg.  Pus is coming from a wound.  You develop a fever or as directed by your health care provider.  You notice a bad smell coming from an ulcer or wound. This information is not intended to replace advice given to you by your health care provider. Make sure you discuss any questions you have with your health care provider. Document Released: 02/07/2000 Document Revised: 07/18/2015 Document Reviewed: 07/19/2012 Elsevier Interactive Patient Education  2017 [employer].

## 2016-08-25 ENCOUNTER — Ambulatory Visit (INDEPENDENT_AMBULATORY_CARE_PROVIDER_SITE_OTHER): Payer: Medicaid Other | Admitting: Obstetrics and Gynecology

## 2016-08-25 ENCOUNTER — Encounter: Payer: Self-pay | Admitting: Obstetrics and Gynecology

## 2016-08-25 VITALS — BP 149/70 | HR 78 | Wt 131.5 lb

## 2016-08-25 DIAGNOSIS — R1032 Left lower quadrant pain: Secondary | ICD-10-CM

## 2016-08-25 DIAGNOSIS — Z9889 Other specified postprocedural states: Secondary | ICD-10-CM

## 2016-08-25 NOTE — Progress Notes (Signed)
66 yo G3P3 here for post op check and evaluation of LLQ pain. Patient reports feeling well since her procedure. She denies any pelvic pain or bleeding. Patient reports some intermittent left lower quadrant pain for several months, before her May D&C. She describes the pain as a pinching pain. She suffers from constipation and finds it difficult to take the prescribed laxatives as it interfered with her daily living and prevents her from going out to long. She admits that the pain disappears following a bowel movement.   Past Medical History:  Diagnosis Date  . ANEMIA, CHRONIC DISEASE NEC 03/15/2006  . Constipation due to opioid therapy 06/05/2014  . DEGENERATIVE JOINT DISEASE 05/06/2007   On going for >5 yrs No imaging to confirm Has tried ultram, mobic, neurontin which did not work Ambulance person tried once worked well    . Diabetes mellitus   . DIABETIC  RETINOPATHY 03/15/2006  . 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 Diastolic dysfunction 25/00/3704   Grade 1 by Echocardiogram 12/13/14  . Heart murmur   . Hemorrhoids   . History of kidney stones   . Hyperlipidemia 04/01/2011  . Hypertension   . Moderate aortic regurgitation 09/25/2008   12/13/14 Echocardiogram    . PANCREATITIS, CHRONIC 03/29/2006  . Peripheral arterial disease ([place])   . Pneumonia    age 13-20  . RENAL INSUFFICIENCY, CHRONIC 03/15/2006   Past Surgical History:  Procedure Laterality Date  . BREAST SURGERY    . COLONOSCOPY    . DILATION AND CURETTAGE OF UTERUS N/A 06/16/2016   Procedure: DILATATION AND CURETTAGE;  Surgeon: Mora Bellman, MD;  Location: [facility];  Service: Gynecology;  Laterality: N/A;  . HYSTEROSCOPY N/A 06/16/2016   Procedure: HYSTEROSCOPY;  Surgeon: Mora Bellman, MD;  Location: [facility];  Service: Gynecology;  Laterality: N/A;  . MOUTH SURGERY N/A   . POLYPECTOMY N/A 06/16/2016   Procedure: POLYPECTOMY;  Surgeon: Mora Bellman, MD;  Location: [facility];  Service: Gynecology;  Laterality: N/A;   Family History   Problem Relation Age of Onset  . Dementia Mother    Social History  Substance Use Topics  . Smoking status: Current Some Day Smoker    Packs/day: 0.50    Years: 44.00    Types: Cigarettes  . Smokeless tobacco: Never Used  . Alcohol use 0.0 oz/week     Comment: occ   ROS See pertinent in HPI  Blood pressure (!) 149/70, pulse 78, weight 131 lb 8 oz (59.6 kg), last menstrual period 02/22/2016. GENERAL: Well-developed, well-nourished female in no acute distress.  ABDOMEN: Soft, nontender, nondistended.  PELVIC: Not indicated EXTREMITIES: No cyanosis, clubbing, or edema, 2+ distal pulses.  A/P 66 yo here for post op check and evaluation of Left flank pain - Patient is medically cleared from a GYN stand point to resume all activities. Patient advised to return if she experiences PMB - Left flank pain likely secondary to constipation. Advised to consume high fiber foods and to drink 8-10 glasses of water daily - patient to follow up with PCP for further management of this Left flank pain

## 2016-09-01 ENCOUNTER — Encounter: Payer: Self-pay | Admitting: Podiatry

## 2016-09-01 ENCOUNTER — Ambulatory Visit (INDEPENDENT_AMBULATORY_CARE_PROVIDER_SITE_OTHER): Payer: Medicaid Other | Admitting: Podiatry

## 2016-09-01 DIAGNOSIS — E11622 Type 2 diabetes mellitus with other skin ulcer: Secondary | ICD-10-CM | POA: Diagnosis not present

## 2016-09-01 DIAGNOSIS — L98499 Non-pressure chronic ulcer of skin of other sites with unspecified severity: Secondary | ICD-10-CM | POA: Diagnosis not present

## 2016-09-01 NOTE — Patient Instructions (Signed)
Wear the surgical shoe on the right foot Apply Silvadene cream to skin ulcer on the bottom right foot if you notice drainage  Diabetes and Foot Care Diabetes may cause you to have problems because of poor blood supply (circulation) to your feet and legs. This may cause the skin on your feet to become thinner, break easier, and heal more slowly. Your skin may become dry, and the skin may peel and crack. You may also have nerve damage in your legs and feet causing decreased feeling in them. You may not notice minor injuries to your feet that could lead to infections or more serious problems. Taking care of your feet is one of the most important things you can do for yourself. Follow these instructions at home:  Wear shoes at all times, even in the house. Do not go barefoot. Bare feet are easily injured.  Check your feet daily for blisters, cuts, and redness. If you cannot see the bottom of your feet, use a mirror or ask someone for help.  Wash your feet with warm water (do not use hot water) and mild soap. Then pat your feet and the areas between your toes until they are completely dry. Do not soak your feet as this can dry your skin.  Apply a moisturizing lotion or petroleum jelly (that does not contain alcohol and is unscented) to the skin on your feet and to dry, brittle toenails. Do not apply lotion between your toes.  Trim your toenails straight across. Do not dig under them or around the cuticle. File the edges of your nails with an emery board or nail file.  Do not cut corns or calluses or try to remove them with medicine.  Wear clean socks or stockings every day. Make sure they are not too tight. Do not wear knee-high stockings since they may decrease blood flow to your legs.  Wear shoes that fit properly and have enough cushioning. To break in new shoes, wear them for just a few hours a day. This prevents you from injuring your feet. Always look in your shoes before you put them on to  be sure there are no objects inside.  Do not cross your legs. This may decrease the blood flow to your feet.  If you find a minor scrape, cut, or break in the skin on your feet, keep it and the skin around it clean and dry. These areas may be cleansed with mild soap and water. Do not cleanse the area with peroxide, alcohol, or iodine.  When you remove an adhesive bandage, be sure not to damage the skin around it.  If you have a wound, look at it several times a day to make sure it is healing.  Do not use heating pads or hot water bottles. They may burn your skin. If you have lost feeling in your feet or legs, you may not know it is happening until it is too late.  Make sure your health care provider performs a complete foot exam at least annually or more often if you have foot problems. Report any cuts, sores, or bruises to your health care provider immediately. Contact a health care provider if:  You have an injury that is not healing.  You have cuts or breaks in the skin.  You have an ingrown nail.  You notice redness on your legs or feet.  You feel burning or tingling in your legs or feet.  You have pain or cramps in your legs  and feet.  Your legs or feet are numb.  Your feet always feel cold. Get help right away if:  There is increasing redness, swelling, or pain in or around a wound.  There is a red line that goes up your leg.  Pus is coming from a wound.  You develop a fever or as directed by your health care provider.  You notice a bad smell coming from an ulcer or wound. This information is not intended to replace advice given to you by your health care provider. Make sure you discuss any questions you have with your health care provider. Document Released: 02/07/2000 Document Revised: 07/18/2015 Document Reviewed: 07/19/2012 Elsevier Interactive Patient Education  2017 [employer].

## 2016-09-01 NOTE — Progress Notes (Signed)
Patient ID: [name], female   DOB: 1951/02/09, 66 y.o.   MRN: [id number]    Subjective: This patient presents for ongoing maintenance for pre-ulcerative or ulcerative plantar skin lesions on right and left feet. plantar fifth right MPJ as well as ongoing debridement of pre-ulcerative calluses.Patient is a known diabetic with a history of peripheral arterial disease and neuropathy Patient continue to smoke. The superficial ulcer on the visit of 06/23/2016 was 5 mm in diameter  Objective: Objective: Orientated 3 DP pulses 2/4 bilaterally PT pulses trace palpable bilaterally Capillary reflex immediate bilaterally Sensation to 10 g monofilament wire intact 5/5 bilaterally Vibratory sensation reactive bilaterally Ankle reflex equal and reactive bilaterally 77mm superficial ulcer with granular base fifth right .The ulcer surrounded by hyperkeratotic tissue. There is no surrounding erythema, edema, drainage, warmth or malodor Pre-ulcerative plantar calluses first MPJ bilaterally and fifth left MPJ Nucleated keratoses plantar left heel   Assessment: Diabetic with peripheral neuropathy and peripheral arterial disease Pre-ulcerative plantar calluses 3 Superficial noninfected ulcer fifth MPJ right   Plan: Debride plantar ulcer fifth MPJ right and plantar left first MPJ and apply Silvadene dressing Instructed patient apply Silvadene dressing and gauze to the fifth MPJ ulcer right and wear a surgical shoe on the right foot Debride plantar pre-ulcerative calluses 3 without anybleeding Wear surgical shoe on right foot  Reappoint 3 weeks

## 2016-09-10 ENCOUNTER — Other Ambulatory Visit: Payer: Self-pay | Admitting: *Deleted

## 2016-09-10 MED ORDER — SITAGLIPTIN PHOSPHATE 50 MG PO TABS: 50.0000 mg | ORAL_TABLET | Freq: Every day | ORAL | 0 refills | Status: DC

## 2016-09-18 ENCOUNTER — Ambulatory Visit (INDEPENDENT_AMBULATORY_CARE_PROVIDER_SITE_OTHER): Payer: Medicaid Other | Admitting: Internal Medicine

## 2016-09-18 ENCOUNTER — Encounter (INDEPENDENT_AMBULATORY_CARE_PROVIDER_SITE_OTHER): Payer: Self-pay

## 2016-09-18 ENCOUNTER — Telehealth: Payer: Self-pay

## 2016-09-18 ENCOUNTER — Other Ambulatory Visit (INDEPENDENT_AMBULATORY_CARE_PROVIDER_SITE_OTHER): Payer: Medicaid Other

## 2016-09-18 ENCOUNTER — Encounter: Payer: Self-pay | Admitting: Internal Medicine

## 2016-09-18 VITALS — BP 130/70 | HR 67 | Ht 65.0 in | Wt 130.0 lb

## 2016-09-18 DIAGNOSIS — R634 Abnormal weight loss: Secondary | ICD-10-CM

## 2016-09-18 DIAGNOSIS — R1032 Left lower quadrant pain: Secondary | ICD-10-CM

## 2016-09-18 DIAGNOSIS — K5900 Constipation, unspecified: Secondary | ICD-10-CM

## 2016-09-18 DIAGNOSIS — K861 Other chronic pancreatitis: Secondary | ICD-10-CM

## 2016-09-18 LAB — BASIC METABOLIC PANEL
BUN: 39 mg/dL — AB (ref 6–23)
CALCIUM: 9.2 mg/dL (ref 8.4–10.5)
CO2: 23 meq/L (ref 19–32)
CREATININE: 1.09 mg/dL (ref 0.40–1.20)
Chloride: 107 mEq/L (ref 96–112)
GFR: 64.86 mL/min (ref >60.00)
GLUCOSE: 119 mg/dL — AB (ref 70–99)
Potassium: 4.4 mEq/L (ref 3.5–5.1)
SODIUM: 137 meq/L (ref 135–145)

## 2016-09-18 NOTE — Progress Notes (Signed)
HISTORY OF PRESENT ILLNESS:  [name] is a 66 y.o. female with multiple significant medical problems as listed below. 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 She was last seen in this office March 2018 regarding functional constipation with lower abdominal pain. See that dictation. She subsequently underwent complete colonoscopy 06/24/2016. She was found to have pandiverticulosis and a diminutive right colon adenoma which was removed. Also internal hemorrhoids. Patient tells me that she has had ongoing problems with constipation. She takes one dose of MiraLAX daily. Also complains of left lower quadrant/left flank/left lower back pain. For this she went to the emergency room 08/11/2016. Laboratories at that time have been reviewed. Stable CBC and chemistries. Elevated transaminases in the 60 range. Non-contrast CT scan, renal protocol, was negative for hydronephrosis or obstructing stone. She was noted to have moderate colonic stool burden.. Thickened endometrium. She has been seen by gynecology for this. Patient tells me that she has had weight loss. Indeed she has lost 10 pounds since her March visit. She tells me that she is eating well. She tells me that she is compliant with her pancreatic enzymes. She tells me that she continues to drink alcohol. She tells me that she was discharged from the internal medicine clinic for unspecified reasons.  REVIEW OF SYSTEMS:  All non-GI ROS negative except for arthritis, back pain, headaches, night sweats, ankle swelling  Past Medical History:  Diagnosis Date  . ANEMIA, CHRONIC DISEASE NEC 03/15/2006  . Constipation due to opioid therapy 06/05/2014  . DEGENERATIVE JOINT DISEASE 05/06/2007   On going for >5 yrs No  imaging to confirm Has tried ultram, mobic, neurontin which did not work Ambulance person tried once worked well    . Diabetes mellitus   . DIABETIC  RETINOPATHY 03/15/2006  . 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 Diastolic dysfunction 76/54/6503   Grade 1 by Echocardiogram 12/13/14  . Heart murmur   . Hemorrhoids   . History of kidney stones   . Hyperlipidemia 04/01/2011  . Hypertension   . Moderate aortic regurgitation 09/25/2008   12/13/14 Echocardiogram    . PANCREATITIS, CHRONIC 03/29/2006  . Peripheral arterial disease ([place])   . Pneumonia    age [age]  . RENAL INSUFFICIENCY, CHRONIC 03/15/2006    Past Surgical History:  Procedure Laterality Date  . BREAST SURGERY    . COLONOSCOPY    . DILATION AND CURETTAGE OF UTERUS N/A 06/16/2016   Procedure: DILATATION AND CURETTAGE;  Surgeon: Mora Bellman, MD;  Location: [facility];  Service: Gynecology;  Laterality: N/A;  . HYSTEROSCOPY N/A 06/16/2016   Procedure: HYSTEROSCOPY;  Surgeon: Mora Bellman, MD;  Location: [facility];  Service: Gynecology;  Laterality: N/A;  . MOUTH SURGERY N/A   . POLYPECTOMY N/A 06/16/2016   Procedure: POLYPECTOMY;  Surgeon: Mora Bellman, MD;  Location: [facility];  Service: Gynecology;  Laterality: N/A;    Social History [name]  reports that she has been smoking Cigarettes.  She has a 22.00 pack-year smoking history. She has never used smokeless tobacco. She reports that she drinks alcohol. She reports that she does not use drugs.  family history includes Dementia in her mother.  Allergies  Allergen Reactions  . Gabapentin Swelling    Legs and feet  . Nicorette [Nicotine] Nausea Only  PHYSICAL EXAMINATION: Vital signs: BP 130/70 (BP Location: Left Arm, Patient Position: Sitting, Cuff Size: Normal)   Pulse 67   Ht 5' 5"  (1.651 m)   Wt 130 lb (59 kg)   LMP 02/22/2016   BMI 21.63 kg/m   Constitutional: Thin somewhat unhealthy appearing, no acute distress Psychiatric: alert and oriented x3,  cooperative Eyes: extraocular movements intact, anicteric, conjunctiva pink Mouth: oral pharynx moist, no lesions Neck: supple no lymphadenopathy Cardiovascular: heart regular rate and rhythm, no murmur Lungs: clear to auscultation bilaterally Abdomen: soft, nontender, nondistended, no obvious ascites, no peritoneal signs, normal bowel sounds, no organomegaly Rectal: Omitted Extremities: no clubbing or cyanosis. Trace lower extremity edema bilaterally Skin: no lesions on visible extremities Neuro: No focal deficits. Cranial nerves intact. No asterixis.   ASSESSMENT:  #1. Lower abdominal/flank pain. Possibly related to constipation. No other abnormalities identified #2. Chronic constipation. Ongoing. Not improved with 1 dose of MiraLAX daily #3. Chronic pancreatitis secondary to alcohol abuse #4. Alcohol abuse. Ongoing #5. Elevated transaminases secondary to alcohol #6. History of adenomatous colon polyps. Last colonoscopy May 2018 #7. Diverticulosis without diverticulitis #8. Multiple medical problems. Question regarding PCP in continuity of care #9. Weight loss. Rule out occult malignancy  PLAN:  #1. Increase MiraLAX to 3 doses daily. Adjust as needed to achieve one or 2 bowel movements per day #2. Stop drinking alcohol as this is affecting the pancreas and liver #3. Contrast-enhanced CT scan of the abdomen and pelvis. Rule out pancreatic mass given weight loss #4. Return to internal medicine service to see if she can be reassigned to a supervising physician. She agrees #5. GI follow-up 6 weeks  #6. Surveillance colonoscopy around May 2023   40 minutes spent face-to-face with the patient. Greater than 50% a time use for counseling regarding her myriad GI issues as outlined above and careful review of the recommendations and plan

## 2016-09-18 NOTE — Patient Instructions (Addendum)
We have sent the following medications to your pharmacy for you to pick up at your convenience:  BMET   You have been scheduled for a CT scan of the abdomen and pelvis at [facility] ([address]---this is in the same building as [facility]).   You are scheduled on 09/23/2016 at 11:30am. You should arrive 30 minutes prior to your appointment time for registration. Please follow the written instructions below on the day of your exam:  WARNING: IF YOU ARE ALLERGIC TO IODINE/X-RAY DYE, PLEASE NOTIFY RADIOLOGY IMMEDIATELY AT [phone]! YOU WILL BE GIVEN A 13 HOUR PREMEDICATION PREP.  1) Do not eat or drink anything after 7:30am(4 hours prior to your test) You may take any medications as prescribed with a small amount of water except for the following: Metformin, Glucophage, Glucovance, Avandamet, Riomet, Fortamet, Actoplus Met, Janumet, Glumetza or Metaglip. The above medications must be held the day of the exam AND 48 hours after the exam.   If you have any questions regarding your exam or if you need to reschedule, you may call the CT department at [phone] between the hours of 8:00 am and 5:00 pm, Monday-Friday.   Please follow up with Dr. Henrene Pastor on 11/05/2016 at 10:00am ________________________________________________________________________

## 2016-09-22 ENCOUNTER — Ambulatory Visit (INDEPENDENT_AMBULATORY_CARE_PROVIDER_SITE_OTHER): Payer: Medicaid Other | Admitting: Podiatry

## 2016-09-22 ENCOUNTER — Encounter: Payer: Self-pay | Admitting: Podiatry

## 2016-09-22 VITALS — BP 142/68 | HR 61

## 2016-09-22 DIAGNOSIS — B351 Tinea unguium: Secondary | ICD-10-CM | POA: Diagnosis not present

## 2016-09-22 DIAGNOSIS — I739 Peripheral vascular disease, unspecified: Secondary | ICD-10-CM

## 2016-09-22 DIAGNOSIS — E11622 Type 2 diabetes mellitus with other skin ulcer: Secondary | ICD-10-CM

## 2016-09-22 DIAGNOSIS — L98499 Non-pressure chronic ulcer of skin of other sites with unspecified severity: Secondary | ICD-10-CM | POA: Diagnosis not present

## 2016-09-22 NOTE — Patient Instructions (Signed)
Apply Silvadene cream to the skin ulcer on the bottom right foot if drainage is noted Okay to begin wearing diabetic shoes with custom insoles If the skin ulcer on the bottom right foot increases in size or drainage resume wearing a surgical shoe  Diabetes and Foot Care Diabetes may cause you to have problems because of poor blood supply (circulation) to your feet and legs. This may cause the skin on your feet to become thinner, break easier, and heal more slowly. Your skin may become dry, and the skin may peel and crack. You may also have nerve damage in your legs and feet causing decreased feeling in them. You may not notice minor injuries to your feet that could lead to infections or more serious problems. Taking care of your feet is one of the most important things you can do for yourself. Follow these instructions at home:  Wear shoes at all times, even in the house. Do not go barefoot. Bare feet are easily injured.  Check your feet daily for blisters, cuts, and redness. If you cannot see the bottom of your feet, use a mirror or ask someone for help.  Wash your feet with warm water (do not use hot water) and mild soap. Then pat your feet and the areas between your toes until they are completely dry. Do not soak your feet as this can dry your skin.  Apply a moisturizing lotion or petroleum jelly (that does not contain alcohol and is unscented) to the skin on your feet and to dry, brittle toenails. Do not apply lotion between your toes.  Trim your toenails straight across. Do not dig under them or around the cuticle. File the edges of your nails with an emery board or nail file.  Do not cut corns or calluses or try to remove them with medicine.  Wear clean socks or stockings every day. Make sure they are not too tight. Do not wear knee-high stockings since they may decrease blood flow to your legs.  Wear shoes that fit properly and have enough cushioning. To break in new shoes, wear them for  just a few hours a day. This prevents you from injuring your feet. Always look in your shoes before you put them on to be sure there are no objects inside.  Do not cross your legs. This may decrease the blood flow to your feet.  If you find a minor scrape, cut, or break in the skin on your feet, keep it and the skin around it clean and dry. These areas may be cleansed with mild soap and water. Do not cleanse the area with peroxide, alcohol, or iodine.  When you remove an adhesive bandage, be sure not to damage the skin around it.  If you have a wound, look at it several times a day to make sure it is healing.  Do not use heating pads or hot water bottles. They may burn your skin. If you have lost feeling in your feet or legs, you may not know it is happening until it is too late.  Make sure your health care provider performs a complete foot exam at least annually or more often if you have foot problems. Report any cuts, sores, or bruises to your health care provider immediately. Contact a health care provider if:  You have an injury that is not healing.  You have cuts or breaks in the skin.  You have an ingrown nail.  You notice redness on your legs or  feet.  You feel burning or tingling in your legs or feet.  You have pain or cramps in your legs and feet.  Your legs or feet are numb.  Your feet always feel cold. Get help right away if:  There is increasing redness, swelling, or pain in or around a wound.  There is a red line that goes up your leg.  Pus is coming from a wound.  You develop a fever or as directed by your health care provider.  You notice a bad smell coming from an ulcer or wound. This information is not intended to replace advice given to you by your health care provider. Make sure you discuss any questions you have with your health care provider. Document Released: 02/07/2000 Document Revised: 07/18/2015 Document Reviewed: 07/19/2012 Elsevier Interactive  Patient Education  2017 [employer].

## 2016-09-22 NOTE — Progress Notes (Signed)
Patient ID: [name], female   DOB: 1950/04/15, 66 y.o.   MRN: [id number]   Subjective: This patient presents for ongoing maintenance for pre-ulcerative or ulcerative plantar skin lesions on right and left feet. plantar fifth right MPJ as well as ongoing debridement of pre-ulcerative calluses.Patient is a known diabetic with a history of peripheral arterial disease and neuropathy Patient continue to smoke. The superficial ulcer on the visit of 06/23/2016 was 5 mm in diameter  Objective: Objective: Orientated 3 DP pulses 2/4 bilaterally PT pulses trace palpable bilaterally Capillary reflex immediate bilaterally Sensation to 10 g monofilament wire intact 5/5 bilaterally Vibratory sensation reactive bilaterally Ankle reflex equal and reactive bilaterally 70mm superficial ulcer with granular base fifth right .The ulcer surrounded by hyperkeratotic tissue. There is no surrounding erythema, edema, drainage, warmth or malodor Pre-ulcerative plantar calluses first MPJ bilaterally and fifth left MPJ Nucleated keratoses plantar left heel Hypertrophic elongated toenails 6-10  Assessment: Diabetic with peripheral neuropathy and peripheral arterial disease Pre-ulcerative plantar calluses 3 Superficial noninfected ulcer fifth MPJ right Mycotic toenails 6-10  Plan: Debride plantar ulcer fifth MPJ right and plantar left first MPJ and apply Silvadene dressing DC surgical shoe and transition into athletic shoe with custom insole Debrided pre-ulcerative plantar calluses Debrided toenails 6-10 without any bleeding  Reappoint 3 weeks

## 2016-09-23 ENCOUNTER — Ambulatory Visit (INDEPENDENT_AMBULATORY_CARE_PROVIDER_SITE_OTHER)
Admission: RE | Admit: 2016-09-23 | Discharge: 2016-09-23 | Disposition: A | Discharge status: Home / Self Care | Payer: Medicaid Other | Source: Ambulatory Visit | Attending: Internal Medicine | Admitting: Internal Medicine

## 2016-09-23 DIAGNOSIS — R1032 Left lower quadrant pain: Secondary | ICD-10-CM | POA: Diagnosis not present

## 2016-09-23 DIAGNOSIS — R634 Abnormal weight loss: Secondary | ICD-10-CM

## 2016-09-23 DIAGNOSIS — K861 Other chronic pancreatitis: Secondary | ICD-10-CM

## 2016-09-23 MED ORDER — IOPAMIDOL (ISOVUE-300) INJECTION 61%
100.0000 mL | Freq: Once | INTRAVENOUS | Status: AC | PRN
  Administered 2016-09-23: 100 mL via INTRAVENOUS

## 2016-09-24 NOTE — Telephone Encounter (Signed)
error 

## 2016-10-13 ENCOUNTER — Ambulatory Visit (INDEPENDENT_AMBULATORY_CARE_PROVIDER_SITE_OTHER): Payer: Medicaid Other | Admitting: Podiatry

## 2016-10-13 ENCOUNTER — Encounter: Payer: Self-pay | Admitting: Podiatry

## 2016-10-13 VITALS — Temp 97.6°F

## 2016-10-13 DIAGNOSIS — E1140 Type 2 diabetes mellitus with diabetic neuropathy, unspecified: Secondary | ICD-10-CM | POA: Diagnosis not present

## 2016-10-13 DIAGNOSIS — L84 Corns and callosities: Secondary | ICD-10-CM

## 2016-10-13 DIAGNOSIS — I739 Peripheral vascular disease, unspecified: Secondary | ICD-10-CM

## 2016-10-13 NOTE — Patient Instructions (Signed)

## 2016-10-13 NOTE — Progress Notes (Signed)
Patient ID: [name], female   DOB: [birth year], 66 y.o.   MRN: [id number]    Subjective: This patient presents for ongoing maintenance for pre-ulcerative or ulcerative plantar skin lesions on right and left feet. plantar fifth right MPJ as well as ongoing debridement of pre-ulcerative calluses.Patient is a known diabetic with a history of peripheral arterial disease and neuropathy Patient continue to smoke. The superficial ulcer on the visit of 06/23/2016 was 5 mm in diameter  Objective: Objective: Orientated 3 DP pulses 2/4 bilaterally PT pulses trace palpable bilaterally Capillary reflex immediate bilaterally Sensation to 10 g monofilament wire intact 5/5 bilaterally Vibratory sensation reactive bilaterally Ankle reflex equal and reactive bilaterally 68mm superficial ulcer with granular base fifth right .The ulcer surrounded by hyperkeratotic tissue. There is no surrounding erythema, edema, drainage, warmth or malodor Pre-ulcerative plantar calluses first MPJ bilaterally and fifth left MPJ, bilaterally Nucleated keratoses plantar left heel Hypertrophic elongated toenails 6-10  Assessment: Diabetic with peripheral neuropathy and peripheral arterial disease Pre-ulcerative plantar calluses  4  Plan: Debrided pre-ulcerative plantar keratoses 4 without any bleeding Patient will wear athletic style shoes with custom insoles  Reappoint 4 weeks

## 2016-10-19 ENCOUNTER — Encounter (HOSPITAL_COMMUNITY): Payer: Self-pay | Admitting: Emergency Medicine

## 2016-10-19 ENCOUNTER — Emergency Department (HOSPITAL_COMMUNITY): Payer: Medicaid Other

## 2016-10-19 DIAGNOSIS — R0602 Shortness of breath: Secondary | ICD-10-CM | POA: Diagnosis not present

## 2016-10-19 DIAGNOSIS — F17210 Nicotine dependence, cigarettes, uncomplicated: Secondary | ICD-10-CM | POA: Insufficient documentation

## 2016-10-19 DIAGNOSIS — Z7982 Long term (current) use of aspirin: Secondary | ICD-10-CM | POA: Diagnosis not present

## 2016-10-19 DIAGNOSIS — E1122 Type 2 diabetes mellitus with diabetic chronic kidney disease: Secondary | ICD-10-CM | POA: Diagnosis not present

## 2016-10-19 DIAGNOSIS — R079 Chest pain, unspecified: Secondary | ICD-10-CM | POA: Insufficient documentation

## 2016-10-19 DIAGNOSIS — I129 Hypertensive chronic kidney disease with stage 1 through stage 4 chronic kidney disease, or unspecified chronic kidney disease: Secondary | ICD-10-CM | POA: Diagnosis not present

## 2016-10-19 DIAGNOSIS — Z7984 Long term (current) use of oral hypoglycemic drugs: Secondary | ICD-10-CM | POA: Diagnosis not present

## 2016-10-19 DIAGNOSIS — N183 Chronic kidney disease, stage 3 (moderate): Secondary | ICD-10-CM | POA: Diagnosis not present

## 2016-10-19 DIAGNOSIS — Z79899 Other long term (current) drug therapy: Secondary | ICD-10-CM | POA: Diagnosis not present

## 2016-10-19 LAB — CBC
HCT: 29.9 % — ABNORMAL LOW (ref 36.0–46.0)
Hemoglobin: 10.2 g/dL — ABNORMAL LOW (ref 12.0–15.0)
MCH: 30.4 pg (ref 26.0–34.0)
MCHC: 34.1 g/dL (ref 30.0–36.0)
MCV: 89.3 fL (ref 78.0–100.0)
PLATELETS: 125 10*3/uL — AB (ref 150–400)
RBC: 3.35 MIL/uL — AB (ref 3.87–5.11)
RDW: 14.5 % (ref 11.5–15.5)
WBC: 3.8 10*3/uL — AB (ref 4.0–10.5)

## 2016-10-19 LAB — I-STAT TROPONIN, ED: TROPONIN I, POC: 0 ng/mL (ref 0.00–0.08)

## 2016-10-19 NOTE — ED Triage Notes (Signed)
Pt having 9/10 bilateral cp since last night radiating to her back with SOB and nausea, no vomiting or fever.

## 2016-10-20 ENCOUNTER — Emergency Department (HOSPITAL_COMMUNITY)
Admission: EM | Admit: 2016-10-20 | Discharge: 2016-10-20 | Disposition: A | Discharge status: Home / Self Care | Payer: Medicaid Other | Attending: Emergency Medicine | Admitting: Emergency Medicine

## 2016-10-20 DIAGNOSIS — R059 Cough, unspecified: Secondary | ICD-10-CM

## 2016-10-20 DIAGNOSIS — R079 Chest pain, unspecified: Secondary | ICD-10-CM

## 2016-10-20 DIAGNOSIS — R05 Cough: Secondary | ICD-10-CM

## 2016-10-20 LAB — BASIC METABOLIC PANEL
Anion gap: 7 (ref 5–15)
BUN: 39 mg/dL — AB (ref 6–20)
CALCIUM: 9.2 mg/dL (ref 8.9–10.3)
CO2: 21 mmol/L — ABNORMAL LOW (ref 22–32)
CREATININE: 1.25 mg/dL — AB (ref 0.44–1.00)
Chloride: 105 mmol/L (ref 101–111)
GFR, EST AFRICAN AMERICAN: 52 mL/min — AB (ref >60)
GFR, EST NON AFRICAN AMERICAN: 44 mL/min — AB (ref >60)
Glucose, Bld: 254 mg/dL — ABNORMAL HIGH (ref 65–99)
Potassium: 3.7 mmol/L (ref 3.5–5.1)
SODIUM: 133 mmol/L — AB (ref 135–145)

## 2016-10-20 MED ORDER — OXYCODONE-ACETAMINOPHEN 5-325 MG PO TABS
1.0000 | ORAL_TABLET | Freq: Once | ORAL | Status: AC
  Administered 2016-10-20: 1 via ORAL
  Filled 2016-10-20: qty 1

## 2016-10-20 NOTE — Discharge Instructions (Signed)
Recommend tylenol or motrin for pain. Follow-up with your primary care doctor tomorrow as scheduled. Return to the ED for new or worsening symptoms.

## 2016-10-20 NOTE — ED Provider Notes (Signed)
[facility] DEPT Provider Note   CSN: [id number] Arrival date & time: 10/19/16  2229     History   Chief Complaint Chief Complaint  Patient presents with  . Shortness of Breath  . Chest Pain    HPI [name] is a 66 y.o. female.  The history is provided by the patient and medical records.  Shortness of Breath  Associated symptoms include cough and chest pain.  Chest Pain   Associated symptoms include cough and shortness of breath.    66 year old female with history of anemia, degenerative joint disease, diabetes, peripheral neuropathy, grade 1 diastolic dysfunction, history of kidney stones, hyperlipidemia, hypertension, chronic pancreatitis, presenting to the ED with chest pain and shortness of breath.  Patient states this began last night and has remained constant since onset. She reports dull, aching pain in her chest and back. She has been coughing up some white sputum. Chest pain is somewhat worse with coughing. She denies any fever or chills. States initially she was short of breath, however this is since resolved.  No pain with deep inspiration. She has been trying to cut back on her smoking, is now down to 8-10 cigs/day.  States she feels this is helping with her overall symptoms. States she has seen a cardiologist in the past and had a stress test performed as well as echocardiogram. She's never had stents or CABG.  No hx of DVT or PE.  Not on anti-coagulation.  Past Medical History:  Diagnosis Date  . ANEMIA, CHRONIC DISEASE NEC 03/15/2006  . Constipation due to opioid therapy 06/05/2014  . DEGENERATIVE JOINT DISEASE 05/06/2007   On going for >5 yrs No imaging to confirm Has tried ultram, mobic, neurontin which did not work Ambulance person tried once worked well    . Diabetes mellitus   . DIABETIC  RETINOPATHY 03/15/2006  . 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 Diastolic dysfunction 94/17/4081   Grade 1 by Echocardiogram 12/13/14  . Heart murmur   . Hemorrhoids   .  History of kidney stones   . Hyperlipidemia 04/01/2011  . Hypertension   . Moderate aortic regurgitation 09/25/2008   12/13/14 Echocardiogram    . PANCREATITIS, CHRONIC 03/29/2006  . Peripheral arterial disease ([place])   . Pneumonia    age 32-20  . RENAL INSUFFICIENCY, CHRONIC 03/15/2006    Patient Active Problem List   Diagnosis Date Noted  . Tubular adenoma of colon 06/30/2016  . Diverticulosis of colon without hemorrhage 06/30/2016  . Postmenopausal vaginal bleeding   . Long term (current) use of opiate analgesic 04/09/2016  . Bilateral carotid artery disease ([place]) 11/28/2015  . Pancreatic insufficiency 05/09/2015  . 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 Risk for coronary artery disease greater than 20% in next 10 years 03/14/2015  . Diastolic dysfunction 44/81/8563  . Vulvar cyst 01/23/2015  . Abnormal uterine bleeding 12/02/2014  . Unexplained night sweats 12/02/2014  . Constipation due to opioid therapy 06/05/2014  . Loss of weight 03/29/2014  . Peripheral arterial disease ([place]) 12/12/2013  . GERD (gastroesophageal reflux disease) 10/12/2012  . Healthcare maintenance 09/29/2012  . Back pain 03/28/2012  . Hyperlipidemia 04/01/2011  . BOILS, RECURRENT 03/13/2010  . Moderate aortic regurgitation 09/25/2008  . HEMORRHOIDS 06/08/2008  . Osteoarthritis 05/06/2007  . TOBACCO ABUSE 09/27/2006  . History of osteoporosis 06/29/2006  . Chronic pancreatitis ([place]) 03/29/2006  . Uncontrolled type 2 diabetes mellitus with chronic kidney disease, without long-term current use of insulin ([place]) 03/15/2006  . Diabetic neuropathy, painful (  [place]) 03/15/2006  . Anemia of chronic disease 03/15/2006  . Essential hypertension 03/15/2006  . VENTRICULAR HYPERTROPHY, LEFT 03/15/2006  . CKD stage 3 due to type 2 diabetes mellitus ([place]) 03/15/2006    Past Surgical History:  Procedure Laterality Date  . BREAST SURGERY    . COLONOSCOPY    . DILATION AND CURETTAGE OF UTERUS N/A 06/16/2016    Procedure: DILATATION AND CURETTAGE;  Surgeon: Mora Bellman, MD;  Location: [facility];  Service: Gynecology;  Laterality: N/A;  . HYSTEROSCOPY N/A 06/16/2016   Procedure: HYSTEROSCOPY;  Surgeon: Mora Bellman, MD;  Location: [facility];  Service: Gynecology;  Laterality: N/A;  . MOUTH SURGERY N/A   . POLYPECTOMY N/A 06/16/2016   Procedure: POLYPECTOMY;  Surgeon: Mora Bellman, MD;  Location: [facility];  Service: Gynecology;  Laterality: N/A;    OB History    Gravida Para Term Preterm AB Living   _0 SAB TAB Ectopic Multiple Live Births           3       Home Medications    Prior to Admission medications   Medication Sig Start Date End Date Taking? Authorizing Provider  amLODipine (NORVASC) 5 MG tablet take 1 tablet by mouth once daily 08/03/16  Yes Lucious Groves, DO  aspirin EC 81 MG tablet Take 1 tablet (81 mg total) by mouth daily. 04/09/16  Yes Lucious Groves, DO  docusate sodium (COLACE) 100 MG capsule Take 1 capsule (100 mg total) by mouth 2 (two) times daily as needed. 06/16/16  Yes Constant, Peggy, MD  Linaclotide (LINZESS PO) Take 1 capsule by mouth daily.   Yes [provider]  losartan (COZAAR) 100 MG tablet Take 1 tablet (100 mg total) by mouth daily. 08/12/16  Yes Lucious Groves, DO  metFORMIN (GLUCOPHAGE-XR) 500 MG 24 hr tablet Take 1 tablet (500 mg total) by mouth daily with breakfast. 04/10/16  Yes Lucious Groves, DO  Multiple Vitamin (MULTIVITAMIN WITH MINERALS) TABS tablet Take 1 tablet by mouth daily. One-a-Day Women's 50+   Yes [provider]  Pancrelipase, Lip-Prot-Amyl, 24000-76000 units CPEP Take 4 capsules (96,000 Units total) by mouth See admin instructions. Take 4 capsules by mouth 2-3 times daily before meals Patient taking differently: Take 72,000-96,000 Units by mouth See admin instructions. Pt takes before meals depends on how much is eating 03/10/16  Yes Hoffman, Rachel Moulds, DO  polyethylene glycol (MIRALAX / GLYCOLAX) packet Take 17 g by  mouth daily as needed for mild constipation. 10/24/15  Yes Oval Linsey, MD  sitaGLIPtin (JANUVIA) 50 MG tablet Take 1 tablet (50 mg total) by mouth daily. 09/10/16  Yes Lucious Groves, DO  ACCU-CHEK AVIVA PLUS test strip TEST three times a day 11/25/15   Lucious Groves, DO  atorvastatin (LIPITOR) 40 MG tablet Take 1 tablet (40 mg total) by mouth daily. Patient not taking: Reported on 10/20/2016 03/25/16 03/25/17  Lucious Groves, DO  Blood Glucose Monitoring Suppl (ACCU-CHEK AVIVA PLUS) w/Device KIT Use to check you blood sugar 3 times a day 06/04/15   Oval Linsey, MD  silver sulfADIAZINE (SILVADENE) 1 % cream Apply 1 application topically daily. Patient not taking: Reported on 10/20/2016 02/25/16   Gean Birchwood, DPM    Family History Family History  Problem Relation Age of Onset  . Dementia Mother     Social History Social History  Substance Use Topics  . Smoking status: Current Some Day Smoker  Packs/day: 0.50    Years: 44.00    Types: Cigarettes  . Smokeless tobacco: Never Used  . Alcohol use 0.0 oz/week     Comment: occ     Allergies   Gabapentin and Nicorette [nicotine]   Review of Systems Review of Systems  Respiratory: Positive for cough and shortness of breath.   Cardiovascular: Positive for chest pain.  All other systems reviewed and are negative.    Physical Exam Updated Vital Signs BP (!) 121/53   Pulse (!) 58   Temp 97.8 F (36.6 C) (Oral)   Resp 16   Ht _0  (1.651 m)   Wt 59 kg (130 lb)   LMP 02/22/2016   SpO2 99%   BMI 21.63 kg/m   Physical Exam  Constitutional: She is oriented to person, place, and time. She appears well-developed and well-nourished.  Sleeping upon entering room, NAD  HENT:  Head: Normocephalic and atraumatic.  Mouth/Throat: Oropharynx is clear and moist.  Eyes: Pupils are equal, round, and reactive to light. Conjunctivae and EOM are normal.  Neck: Normal range of motion.  Cardiovascular: Normal rate, regular  rhythm and normal heart sounds.   No murmur heard. Pulmonary/Chest: Effort normal and breath sounds normal. No respiratory distress. She has no wheezes. She has no rhonchi.  Lungs overall clear, no wheezes or rhonchi noted; able to speak in full sentences without difficulty  Abdominal: Soft. Bowel sounds are normal. There is no tenderness. There is no rebound.  Musculoskeletal: Normal range of motion.  Neurological: She is alert and oriented to person, place, and time.  Skin: Skin is warm and dry.  Psychiatric: She has a normal mood and affect.  Nursing note and vitals reviewed.    ED Treatments / Results  Labs (all labs ordered are listed, but only abnormal results are displayed) Labs Reviewed  BASIC METABOLIC PANEL - Abnormal; Notable for the following:       Result Value   Sodium 133 (*)    CO2 21 (*)    Glucose, Bld 254 (*)    BUN 39 (*)    Creatinine, Ser 1.25 (*)    GFR calc non Af Amer 44 (*)    GFR calc Af Amer 52 (*)    All other components within normal limits  CBC - Abnormal; Notable for the following:    WBC 3.8 (*)    RBC 3.35 (*)    Hemoglobin 10.2 (*)    HCT 29.9 (*)    Platelets 125 (*)    All other components within normal limits  I-STAT TROPONIN, ED    EKG  EKG Interpretation  Date/Time:  Monday October 19 2016 22:34:10 EDT Ventricular Rate:  71 PR Interval:  160 QRS Duration: 104 QT Interval:  426 QTC Calculation: 462 R Axis:   5 Text Interpretation:  Normal sinus rhythm Incomplete right bundle branch block Septal infarct , age undetermined Abnormal ECG No significant change since last tracing Confirmed by Orpah Greek [phone]) on 10/20/2016 6:39:58 AM       Radiology Dg Chest 2 View  Result Date: 10/20/2016 CLINICAL DATA:  Chest pain for 1 day, worsening tonight. EXAM: CHEST  2 VIEW COMPARISON:  11/06/2014 FINDINGS: The heart size and mediastinal contours are within normal limits. Both lungs are clear. The visualized skeletal  structures are unremarkable. IMPRESSION: No active cardiopulmonary disease. Electronically Signed   By: Andreas Newport M.D.   On: 10/20/2016 00:07    Procedures Procedures (including critical care time)  Medications Ordered in ED Medications - No data to display   Initial Impression / Assessment and Plan / ED Course  I have reviewed the triage vital signs and the nursing notes.  Pertinent labs & imaging results that were available during my care of the patient were reviewed by me and considered in my medical decision making (see chart for details).  66 y.o. F here with chest pain/SOB.  Patient states this began last night, has been persistent since then.  States dull/aching pain in her back.  Pains somewhat worse with coughing, has some white sputum.  No fever, chills.  EKG without acute ischemic changes.  Labs overall reassuring.  CXR clear.  Patient's symptoms are somewhat vague, not truly concerning for ACS, PE, dissection, or other acute cardiac event.  Does have some pain with coughing, but not fever, leukocytosis, or acute findings on CXR to suggest CAP.  No tachycardia or hypoxia to suggest PE.  Given negative evaluation here after >8 hours of ongoing symptoms, feel patient is appropriate for discharge. Will have her follow-up with her primary care doctor-- she has appt scheduled for tomorrow afternoon.  Given copies of her labs for physician review.  Discussed plan with patient, she acknowledged understanding and agreed with plan of care.  Return precautions given for new or worsening symptoms.  Final Clinical Impressions(s) / ED Diagnoses   Final diagnoses:  Chest pain, unspecified type  Cough    New Prescriptions Discharge Medication List as of 10/20/2016  7:13 AM       Larene Pickett, PA-C 10/20/16 0801    Orpah Greek, MD 10/21/16 [phone]

## 2016-10-21 ENCOUNTER — Other Ambulatory Visit: Payer: Self-pay | Admitting: Internal Medicine

## 2016-10-21 DIAGNOSIS — K5903 Drug induced constipation: Secondary | ICD-10-CM

## 2016-10-21 DIAGNOSIS — T402X5A Adverse effect of other opioids, initial encounter: Principal | ICD-10-CM

## 2016-10-24 ENCOUNTER — Encounter (HOSPITAL_COMMUNITY): Payer: Self-pay | Admitting: *Deleted

## 2016-10-24 ENCOUNTER — Emergency Department (HOSPITAL_COMMUNITY): Payer: Medicaid Other

## 2016-10-24 ENCOUNTER — Other Ambulatory Visit: Payer: Self-pay

## 2016-10-24 ENCOUNTER — Emergency Department (HOSPITAL_COMMUNITY)
Admission: EM | Admit: 2016-10-24 | Discharge: 2016-10-24 | Disposition: A | Discharge status: Home / Self Care | Payer: Medicaid Other | Attending: Emergency Medicine | Admitting: Emergency Medicine

## 2016-10-24 DIAGNOSIS — N183 Chronic kidney disease, stage 3 (moderate): Secondary | ICD-10-CM | POA: Insufficient documentation

## 2016-10-24 DIAGNOSIS — Z7984 Long term (current) use of oral hypoglycemic drugs: Secondary | ICD-10-CM | POA: Insufficient documentation

## 2016-10-24 DIAGNOSIS — F17210 Nicotine dependence, cigarettes, uncomplicated: Secondary | ICD-10-CM | POA: Insufficient documentation

## 2016-10-24 DIAGNOSIS — Z79899 Other long term (current) drug therapy: Secondary | ICD-10-CM | POA: Insufficient documentation

## 2016-10-24 DIAGNOSIS — I129 Hypertensive chronic kidney disease with stage 1 through stage 4 chronic kidney disease, or unspecified chronic kidney disease: Secondary | ICD-10-CM | POA: Insufficient documentation

## 2016-10-24 DIAGNOSIS — K529 Noninfective gastroenteritis and colitis, unspecified: Secondary | ICD-10-CM | POA: Insufficient documentation

## 2016-10-24 DIAGNOSIS — E1122 Type 2 diabetes mellitus with diabetic chronic kidney disease: Secondary | ICD-10-CM | POA: Insufficient documentation

## 2016-10-24 DIAGNOSIS — R109 Unspecified abdominal pain: Secondary | ICD-10-CM

## 2016-10-24 DIAGNOSIS — Z7982 Long term (current) use of aspirin: Secondary | ICD-10-CM | POA: Insufficient documentation

## 2016-10-24 DIAGNOSIS — R1012 Left upper quadrant pain: Secondary | ICD-10-CM | POA: Diagnosis present

## 2016-10-24 LAB — COMPREHENSIVE METABOLIC PANEL
ALT: 89 U/L — AB (ref 14–54)
AST: 60 U/L — AB (ref 15–41)
Albumin: 3.9 g/dL (ref 3.5–5.0)
Alkaline Phosphatase: 82 U/L (ref 38–126)
Anion gap: 6 (ref 5–15)
BILIRUBIN TOTAL: 0.2 mg/dL — AB (ref 0.3–1.2)
BUN: 47 mg/dL — AB (ref 6–20)
CHLORIDE: 108 mmol/L (ref 101–111)
CO2: 22 mmol/L (ref 22–32)
CREATININE: 1.3 mg/dL — AB (ref 0.44–1.00)
Calcium: 9.4 mg/dL (ref 8.9–10.3)
GFR calc Af Amer: 49 mL/min — ABNORMAL LOW (ref >60)
GFR calc non Af Amer: 42 mL/min — ABNORMAL LOW (ref >60)
Glucose, Bld: 137 mg/dL — ABNORMAL HIGH (ref 65–99)
Potassium: 3.7 mmol/L (ref 3.5–5.1)
Sodium: 136 mmol/L (ref 135–145)
Total Protein: 7.4 g/dL (ref 6.5–8.1)

## 2016-10-24 LAB — URINALYSIS, ROUTINE W REFLEX MICROSCOPIC
Bilirubin Urine: NEGATIVE
GLUCOSE, UA: NEGATIVE mg/dL
Hgb urine dipstick: NEGATIVE
KETONES UR: NEGATIVE mg/dL
Nitrite: NEGATIVE
PH: 5 (ref 5.0–8.0)
Protein, ur: 30 mg/dL — AB
Specific Gravity, Urine: 1.008 (ref 1.005–1.030)

## 2016-10-24 LAB — CBC WITH DIFFERENTIAL/PLATELET
BASOS ABS: 0 10*3/uL (ref 0.0–0.1)
Basophils Relative: 1 %
EOS PCT: 4 %
Eosinophils Absolute: 0.2 10*3/uL (ref 0.0–0.7)
HCT: 30.4 % — ABNORMAL LOW (ref 36.0–46.0)
Hemoglobin: 10.7 g/dL — ABNORMAL LOW (ref 12.0–15.0)
LYMPHS PCT: 46 %
Lymphs Abs: 1.8 10*3/uL (ref 0.7–4.0)
MCH: 30.4 pg (ref 26.0–34.0)
MCHC: 35.2 g/dL (ref 30.0–36.0)
MCV: 86.4 fL (ref 78.0–100.0)
MONO ABS: 0.1 10*3/uL (ref 0.1–1.0)
Monocytes Relative: 4 %
Neutro Abs: 1.7 10*3/uL (ref 1.7–7.7)
Neutrophils Relative %: 45 %
Platelets: 127 10*3/uL — ABNORMAL LOW (ref 150–400)
RBC: 3.52 MIL/uL — ABNORMAL LOW (ref 3.87–5.11)
RDW: 14.2 % (ref 11.5–15.5)
WBC: 3.8 10*3/uL — ABNORMAL LOW (ref 4.0–10.5)

## 2016-10-24 LAB — CBG MONITORING, ED
Glucose-Capillary: 123 mg/dL — ABNORMAL HIGH (ref 65–99)
Glucose-Capillary: 90 mg/dL (ref 65–99)

## 2016-10-24 LAB — LIPASE, BLOOD: Lipase: 18 U/L (ref 11–51)

## 2016-10-24 LAB — I-STAT TROPONIN, ED: Troponin i, poc: 0 ng/mL (ref 0.00–0.08)

## 2016-10-24 LAB — D-DIMER, QUANTITATIVE: D-Dimer, Quant: 0.37 ug/mL-FEU (ref 0.00–0.50)

## 2016-10-24 MED ORDER — METRONIDAZOLE 500 MG PO TABS: 500.0000 mg | ORAL_TABLET | Freq: Two times a day (BID) | ORAL | 0 refills | Status: DC

## 2016-10-24 MED ORDER — ACETAMINOPHEN 325 MG PO TABS
650.0000 mg | ORAL_TABLET | Freq: Once | ORAL | Status: AC
  Administered 2016-10-24: 650 mg via ORAL
  Filled 2016-10-24: qty 2

## 2016-10-24 MED ORDER — SODIUM CHLORIDE 0.9 % IV BOLUS (SEPSIS)
1000.0000 mL | Freq: Once | INTRAVENOUS | Status: AC
  Administered 2016-10-24: 1000 mL via INTRAVENOUS

## 2016-10-24 MED ORDER — METRONIDAZOLE 500 MG PO TABS
500.0000 mg | ORAL_TABLET | Freq: Once | ORAL | Status: AC
  Administered 2016-10-24: 500 mg via ORAL
  Filled 2016-10-24: qty 1

## 2016-10-24 MED ORDER — IOPAMIDOL (ISOVUE-300) INJECTION 61%
INTRAVENOUS | Status: AC
  Administered 2016-10-24: 75 mL via INTRAVENOUS
  Filled 2016-10-24: qty 75

## 2016-10-24 MED ORDER — CIPROFLOXACIN HCL 500 MG PO TABS
500.0000 mg | ORAL_TABLET | Freq: Once | ORAL | Status: AC
  Administered 2016-10-24: 500 mg via ORAL
  Filled 2016-10-24: qty 1

## 2016-10-24 MED ORDER — CIPROFLOXACIN HCL 500 MG PO TABS: 500.0000 mg | ORAL_TABLET | Freq: Two times a day (BID) | ORAL | 0 refills | Status: DC

## 2016-10-24 NOTE — ED Triage Notes (Signed)
Pt complains of flank pain for the past 3 days, which became worse today. Pt denies hematuria or fever.

## 2016-10-24 NOTE — ED Provider Notes (Signed)
[facility] DEPT Provider Note   CSN: [id number] Arrival date & time: 10/24/16  1642     History   Chief Complaint Chief Complaint  Patient presents with  . Flank Pain    HPI [name] is a 66 y.o. female.  HPI  66 year old female with a history of diabetes and chronic kidney disease presents with back pain and flank pain. Has been going on on and off for 3 days. No chest pain since being seen a couple days prior. No urinary symptoms. No nausea/vomiting. No diarrhea/constipation. Pain is mostly near scapulae. Some left flank pain. States this pain has been on and off for a while but recently worse. No fevers or dyspnea. Chronic cough.  Past Medical History:  Diagnosis Date  . ANEMIA, CHRONIC DISEASE NEC 03/15/2006  . Colon polyps   . Constipation due to opioid therapy 06/05/2014  . DEGENERATIVE JOINT DISEASE 05/06/2007   On going for >5 yrs No imaging to confirm Has tried ultram, mobic, neurontin which did not work Ambulance person tried once worked well    . Diabetes mellitus   . DIABETIC  RETINOPATHY 03/15/2006  . 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 Diastolic dysfunction 40/98/1191   Grade 1 by Echocardiogram 12/13/14  . Heart murmur   . Hemorrhoids   . History of kidney stones   . Hyperlipidemia 04/01/2011  . Hypertension   . Moderate aortic regurgitation 09/25/2008   12/13/14 Echocardiogram    . PANCREATITIS, CHRONIC 03/29/2006  . Peripheral arterial disease ([place])   . Pneumonia    age [age]  . RENAL INSUFFICIENCY, CHRONIC 03/15/2006    Patient Active Problem List   Diagnosis Date Noted  . Tubular adenoma of colon 06/30/2016  . Diverticulosis of colon without hemorrhage 06/30/2016  . Postmenopausal vaginal bleeding   . Long term (current) use of opiate analgesic 04/09/2016  . Bilateral carotid artery disease ([place]) 11/28/2015  . Pancreatic insufficiency 05/09/2015  . 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 Risk for coronary artery disease greater than 20%  in next 10 years 03/14/2015  . Diastolic dysfunction 47/82/9562  . Vulvar cyst 01/23/2015  . Abnormal uterine bleeding 12/02/2014  . Unexplained night sweats 12/02/2014  . Constipation due to opioid therapy 06/05/2014  . Loss of weight 03/29/2014  . Peripheral arterial disease ([place]) 12/12/2013  . GERD (gastroesophageal reflux disease) 10/12/2012  . Healthcare maintenance 09/29/2012  . Back pain 03/28/2012  . Hyperlipidemia 04/01/2011  . BOILS, RECURRENT 03/13/2010  . Moderate aortic regurgitation 09/25/2008  . HEMORRHOIDS 06/08/2008  . Osteoarthritis 05/06/2007  . TOBACCO ABUSE 09/27/2006  . History of osteoporosis 06/29/2006  . Chronic pancreatitis ([place]) 03/29/2006  . Uncontrolled type 2 diabetes mellitus with chronic kidney disease, without long-term current use of insulin ([place]) 03/15/2006  . Diabetic neuropathy, painful ([place]) 03/15/2006  . Anemia of chronic disease 03/15/2006  . Essential hypertension 03/15/2006  . VENTRICULAR HYPERTROPHY, LEFT 03/15/2006  . CKD stage 3 due to type 2 diabetes mellitus ([place]) 03/15/2006    Past Surgical History:  Procedure Laterality Date  . BREAST SURGERY    . COLONOSCOPY    . DILATION AND CURETTAGE OF UTERUS N/A 06/16/2016   Procedure: DILATATION AND CURETTAGE;  Surgeon: Mora Bellman, MD;  Location: [facility];  Service: Gynecology;  Laterality: N/A;  . HYSTEROSCOPY N/A 06/16/2016   Procedure: HYSTEROSCOPY;  Surgeon: Mora Bellman, MD;  Location: [facility];  Service: Gynecology;  Laterality: N/A;  . MOUTH SURGERY N/A   . POLYPECTOMY N/A 06/16/2016   Procedure:  POLYPECTOMY;  Surgeon: Mora Bellman, MD;  Location: [facility];  Service: Gynecology;  Laterality: N/A;    OB History    Gravida Para Term Preterm AB Living   _0 SAB TAB Ectopic Multiple Live Births           3       Home Medications    Prior to Admission medications   Medication Sig Start Date End Date Taking? Authorizing Provider  ACCU-CHEK AVIVA PLUS test strip TEST  three times a day 11/25/15  Yes Lucious Groves, DO  amLODipine (NORVASC) 5 MG tablet take 1 tablet by mouth once daily 08/03/16  Yes Lucious Groves, DO  aspirin EC 81 MG tablet Take 1 tablet (81 mg total) by mouth daily. 04/09/16  Yes Lucious Groves, DO  bisacodyl (DULCOLAX) 5 MG EC tablet Take 10 mg by mouth daily as needed for moderate constipation.   Yes [provider]  Blood Glucose Monitoring Suppl (ACCU-CHEK AVIVA PLUS) w/Device KIT Use to check you blood sugar 3 times a day 06/04/15  Yes Oval Linsey, MD  docusate sodium (COLACE) 100 MG capsule Take 1 capsule (100 mg total) by mouth 2 (two) times daily as needed. 06/16/16  Yes Constant, Peggy, MD  hydroxypropyl methylcellulose / hypromellose (ISOPTO TEARS / GONIOVISC) 2.5 % ophthalmic solution Place 1 drop into both eyes as needed for dry eyes.   Yes [provider]  Linaclotide (LINZESS PO) Take 1 capsule by mouth daily.   Yes [provider]  losartan (COZAAR) 100 MG tablet Take 1 tablet (100 mg total) by mouth daily. 08/12/16  Yes Lucious Groves, DO  metFORMIN (GLUCOPHAGE-XR) 500 MG 24 hr tablet Take 1 tablet (500 mg total) by mouth daily with breakfast. 04/10/16  Yes Lucious Groves, DO  Multiple Vitamin (MULTIVITAMIN WITH MINERALS) TABS tablet Take 1 tablet by mouth daily. One-a-Day Women's 50+   Yes [provider]  Pancrelipase, Lip-Prot-Amyl, 24000-76000 units CPEP Take 4 capsules (96,000 Units total) by mouth See admin instructions. Take 4 capsules by mouth 2-3 times daily before meals Patient taking differently: Take 72,000-96,000 Units by mouth See admin instructions. Pt takes before meals depends on how much is eating 03/10/16  Yes Hoffman, Rachel Moulds, DO  sitaGLIPtin (JANUVIA) 50 MG tablet Take 1 tablet (50 mg total) by mouth daily. 09/10/16  Yes Lucious Groves, DO  atorvastatin (LIPITOR) 40 MG tablet Take 1 tablet (40 mg total) by mouth daily. Patient not taking: Reported on 10/20/2016 03/25/16  03/25/17  Lucious Groves, DO  ciprofloxacin (CIPRO) 500 MG tablet Take 1 tablet (500 mg total) by mouth 2 (two) times daily. One po bid x 7 days 10/24/16   Sherwood Gambler, MD  metroNIDAZOLE (FLAGYL) 500 MG tablet Take 1 tablet (500 mg total) by mouth 2 (two) times daily. One po bid x 7 days 10/24/16   Sherwood Gambler, MD  polyethylene glycol [facility] / GLYCOLAX) packet Take 17 g by mouth daily as needed for mild constipation. Patient not taking: Reported on 10/24/2016 10/24/15   Oval Linsey, MD  silver sulfADIAZINE (SILVADENE) 1 % cream Apply 1 application topically daily. Patient not taking: Reported on 10/20/2016 02/25/16   Gean Birchwood, DPM    Family History Family History  Problem Relation Age of Onset  . Dementia Mother     Social History Social History  Substance Use Topics  . Smoking status: Current Some Day Smoker  Packs/day: 0.50    Years: 44.00    Types: Cigarettes  . Smokeless tobacco: Never Used  . Alcohol use 0.0 oz/week     Comment: occ     Allergies   Gabapentin and Nicorette [nicotine]   Review of Systems Review of Systems  Constitutional: Negative for fever.  Respiratory: Positive for cough (chronic). Negative for shortness of breath.   Cardiovascular: Negative for chest pain.  Gastrointestinal: Negative for nausea and vomiting.  Genitourinary: Positive for flank pain. Negative for dysuria.  Musculoskeletal: Positive for back pain.  All other systems reviewed and are negative.    Physical Exam Updated Vital Signs BP 124/68   Pulse 68   Temp 98.7 F (37.1 C) (Oral)   Resp 18   LMP 02/22/2016   SpO2 99%   Physical Exam  Constitutional: She is oriented to person, place, and time. She appears well-developed and well-nourished.  HENT:  Head: Normocephalic and atraumatic.  Right Ear: External ear normal.  Left Ear: External ear normal.  Nose: Nose normal.  Eyes: Right eye exhibits no discharge. Left eye exhibits no discharge.    Cardiovascular: Normal rate, regular rhythm and normal heart sounds.   Pulses:      Radial pulses are 2+ on the right side, and 2+ on the left side.  Pulmonary/Chest: Effort normal and breath sounds normal.  Abdominal: Soft. She exhibits no distension. There is no tenderness. There is no CVA tenderness.  Musculoskeletal:       Cervical back: She exhibits no tenderness.       Thoracic back: She exhibits no tenderness.       Lumbar back: She exhibits no tenderness.  Neurological: She is alert and oriented to person, place, and time.  Skin: Skin is warm and dry.  Nursing note and vitals reviewed.    ED Treatments / Results  Labs (all labs ordered are listed, but only abnormal results are displayed) Labs Reviewed  COMPREHENSIVE METABOLIC PANEL - Abnormal; Notable for the following:       Result Value   Glucose, Bld 137 (*)    BUN 47 (*)    Creatinine, Ser 1.30 (*)    AST 60 (*)    ALT 89 (*)    Total Bilirubin 0.2 (*)    GFR calc non Af Amer 42 (*)    GFR calc Af Amer 49 (*)    All other components within normal limits  CBC WITH DIFFERENTIAL/PLATELET - Abnormal; Notable for the following:    WBC 3.8 (*)    RBC 3.52 (*)    Hemoglobin 10.7 (*)    HCT 30.4 (*)    Platelets 127 (*)    All other components within normal limits  URINALYSIS, ROUTINE W REFLEX MICROSCOPIC - Abnormal; Notable for the following:    APPearance HAZY (*)    Protein, ur 30 (*)    Leukocytes, UA LARGE (*)    Bacteria, UA RARE (*)    Squamous Epithelial / LPF 0-5 (*)    All other components within normal limits  CBG MONITORING, ED - Abnormal; Notable for the following:    Glucose-Capillary 123 (*)    All other components within normal limits  URINE CULTURE  LIPASE, BLOOD  D-DIMER, QUANTITATIVE (NOT AT [facility])  I-STAT TROPONIN, ED  CBG MONITORING, ED    EKG  EKG Interpretation  Date/Time:  Saturday October 24 2016 18:45:36 EDT Ventricular Rate:  67 PR Interval:    QRS Duration: 102 QT  Interval:  416 QTC Calculation: 440 R Axis:   29 Text Interpretation:  Sinus rhythm Probable anteroseptal infarct, old incomplete RBBB no significant change compaerd to Oct 19 2016 Confirmed by Sherwood Gambler [phone]) on 10/24/2016 6:55:54 PM       Radiology Dg Chest 2 View  Result Date: 10/24/2016 CLINICAL DATA:  Upper back pain with cough EXAM: CHEST  2 VIEW COMPARISON:  10/19/2016 FINDINGS: The heart size and mediastinal contours are within normal limits. Both lungs are clear. The visualized skeletal structures are unremarkable. Surgical clips in the right upper quadrant IMPRESSION: No active cardiopulmonary disease. Electronically Signed   By: Donavan Foil M.D.   On: 10/24/2016 18:06   Ct Abdomen Pelvis W Contrast  Result Date: 10/24/2016 CLINICAL DATA:  Flank pain for 3 days EXAM: CT ABDOMEN AND PELVIS WITH CONTRAST TECHNIQUE: Multidetector CT imaging of the abdomen and pelvis was performed using the standard protocol following bolus administration of intravenous contrast. CONTRAST:  75 mL Isovue 300 intravenous COMPARISON:  09/23/2016, pelvic ultrasound 04/18/2016, CT 05/28/2014 FINDINGS: Lower chest: Lung bases demonstrate no acute consolidation or pleural effusion. Heart size is normal. Hepatobiliary: Previously demonstrated hyperenhancing foci in the liver on 09/23/2016 CT are not well visualized today. Surgical clips at the gallbladder fossa. Stable appearance of prominent extrahepatic bile duct likely due to postsurgical change. Pancreas: Atrophic pancreas with multiple calcifications consistent with chronic pancreatitis. Spleen: Normal in size without focal abnormality. Adrenals/Urinary Tract: Adrenal glands are within normal limits. Subcentimeter cortical hypodensities in both kidneys too small to further characterize. Bladder normal Stomach/Bowel: Stomach is nonenlarged. No dilated small bowel. Suspected diffuse colon wall thickening. Appendix poorly visualized but no right lower quadrant  inflammation Vascular/Lymphatic: Aortic atherosclerosis. No enlarged abdominal or pelvic lymph nodes. Reproductive: Thickened endometrial stripe up to 9 mm. No adnexal mass small enhancing nodules in the uterus, probably fibroids. Other: Negative for free air or free fluid Musculoskeletal: Grade 1 anterolisthesis of L4 on L5 with degenerative changes. No acute or suspicious bone lesion. IMPRESSION: 1. Suspect that there is diffuse colon wall thickening, as may be seen with a colitis of infectious or inflammatory etiology. C difficile colitis may also be considered in the appropriate clinical setting. 2. Findings consistent with chronic pancreatitis 3. Thickening of the endometrial stripe up to 8 mm. Clinical correlation recommended as epic record indicates hysteroscopy April 2018. 4. Hyperenhancing foci in the liver noted on 09/23/2016 CT are not well appreciated today, please refer to the prior report for additional recommendations Electronically Signed   By: Donavan Foil M.D.   On: 10/24/2016 21:42    Procedures Procedures (including critical care time)  Medications Ordered in ED Medications  sodium chloride 0.9 % bolus 1,000 mL (0 mLs Intravenous Stopped 10/24/16 2234)  acetaminophen (TYLENOL) tablet 650 mg (650 mg Oral Given 10/24/16 2023)  iopamidol (ISOVUE-300) 61 % injection (75 mLs Intravenous Contrast Given 10/24/16 2105)  ciprofloxacin (CIPRO) tablet 500 mg (500 mg Oral Given 10/24/16 2234)  metroNIDAZOLE (FLAGYL) tablet 500 mg (500 mg Oral Given 10/24/16 2234)     Initial Impression / Assessment and Plan / ED Course  I have reviewed the triage vital signs and the nursing notes.  Pertinent labs & imaging results that were available during my care of the patient were reviewed by me and considered in my medical decision making (see chart for details).     Given persistent pain in LUQ now on re-eval, CT obtained. Shows possible colitis. Will given antibiotics. No diarrhea, thus I doubt c-diff.  Feeling well at this time. Low risk for PE with neg ddimer. Doubt ACS. Well appearing. D/c with antibiotics, f/u with PCP.   Final Clinical Impressions(s) / ED Diagnoses   Final diagnoses:  Left flank pain  Colitis    New Prescriptions Discharge Medication List as of 10/24/2016 10:33 PM    START taking these medications   Details  ciprofloxacin (CIPRO) 500 MG tablet Take 1 tablet (500 mg total) by mouth 2 (two) times daily. One po bid x 7 days, Starting Sat 10/24/2016, Print    metroNIDAZOLE (FLAGYL) 500 MG tablet Take 1 tablet (500 mg total) by mouth 2 (two) times daily. One po bid x 7 days, Starting Sat 10/24/2016, Print         Sherwood Gambler, MD 10/25/16 [phone]

## 2016-10-27 ENCOUNTER — Other Ambulatory Visit: Payer: Self-pay | Admitting: Internal Medicine

## 2016-10-27 DIAGNOSIS — R531 Weakness: Secondary | ICD-10-CM | POA: Insufficient documentation

## 2016-10-27 DIAGNOSIS — Z7982 Long term (current) use of aspirin: Secondary | ICD-10-CM | POA: Diagnosis not present

## 2016-10-27 DIAGNOSIS — E2839 Other primary ovarian failure: Secondary | ICD-10-CM

## 2016-10-27 DIAGNOSIS — Z79899 Other long term (current) drug therapy: Secondary | ICD-10-CM | POA: Diagnosis not present

## 2016-10-27 DIAGNOSIS — E11649 Type 2 diabetes mellitus with hypoglycemia without coma: Secondary | ICD-10-CM | POA: Diagnosis present

## 2016-10-27 DIAGNOSIS — Z1231 Encounter for screening mammogram for malignant neoplasm of breast: Secondary | ICD-10-CM

## 2016-10-27 DIAGNOSIS — Z7984 Long term (current) use of oral hypoglycemic drugs: Secondary | ICD-10-CM | POA: Diagnosis not present

## 2016-10-27 DIAGNOSIS — I5032 Chronic diastolic (congestive) heart failure: Secondary | ICD-10-CM | POA: Diagnosis not present

## 2016-10-27 DIAGNOSIS — N183 Chronic kidney disease, stage 3 (moderate): Secondary | ICD-10-CM | POA: Insufficient documentation

## 2016-10-27 DIAGNOSIS — F17210 Nicotine dependence, cigarettes, uncomplicated: Secondary | ICD-10-CM | POA: Diagnosis not present

## 2016-10-27 DIAGNOSIS — I130 Hypertensive heart and chronic kidney disease with heart failure and stage 1 through stage 4 chronic kidney disease, or unspecified chronic kidney disease: Secondary | ICD-10-CM | POA: Diagnosis not present

## 2016-10-27 LAB — URINE CULTURE: Culture: <10000 — AB

## 2016-10-28 ENCOUNTER — Encounter (HOSPITAL_COMMUNITY): Payer: Self-pay | Admitting: Emergency Medicine

## 2016-10-28 ENCOUNTER — Emergency Department (HOSPITAL_COMMUNITY)
Admission: EM | Admit: 2016-10-28 | Discharge: 2016-10-28 | Disposition: A | Discharge status: Home / Self Care | Payer: Medicaid Other | Attending: Emergency Medicine | Admitting: Emergency Medicine

## 2016-10-28 DIAGNOSIS — E162 Hypoglycemia, unspecified: Secondary | ICD-10-CM

## 2016-10-28 LAB — CBG MONITORING, ED
GLUCOSE-CAPILLARY: 170 mg/dL — AB (ref 65–99)
GLUCOSE-CAPILLARY: 189 mg/dL — AB (ref 65–99)

## 2016-10-28 MED ORDER — ALBUTEROL SULFATE HFA 108 (90 BASE) MCG/ACT IN AERS
2.0000 | INHALATION_SPRAY | Freq: Once | RESPIRATORY_TRACT | Status: AC
  Administered 2016-10-28: 2 via RESPIRATORY_TRACT
  Filled 2016-10-28: qty 6.7

## 2016-10-28 NOTE — ED Notes (Signed)
Pt states understanding of d/c instructions. Pt discharged with all belongings in possession.

## 2016-10-28 NOTE — ED Provider Notes (Signed)
[facility]ER[facility]ENC[facility] DEPT Provider Note   CSN: [id number] Arrival date & time: 10/27/16  2331     History   Chief Complaint Chief Complaint  Patient presents with  . Hypoglycemia    HPI [name] is a 66 y.o. female.  HPI Patient presents with concern ofinconsistent blood glucose readings, fatigue. She notes that since evaluation at [facility] a few days ago for abdominal pain, with diagnosis of colitis she has generally been improving, but today, has had persistent mild generalized weakness, and has felt her blood sugar to be lower than normal.  She has been taking all diabetes medication as directed, and has been taking antibiotics for her recently diagnosed colitis. She has noissues with constipation, no abdominal pain, no syncope, no chest pain. Today she notes that during multiple glucose checks she has had persistently low readings, beyond baseline Currently she states that she feels generally well.    Past Medical History:  Diagnosis Date  . ANEMIA, CHRONIC DISEASE NEC 03/15/2006  . Colon polyps   . Constipation due to opioid therapy 06/05/2014  . DEGENERATIVE JOINT DISEASE 05/06/2007   On going for >5 yrs No imaging to confirm Has tried ultram, mobic, neurontin which did not work Microbiologist tried once worked well    . Diabetes mellitus   . DIABETIC  RETINOPATHY 03/15/2006  . 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 Diastolic dysfunction 01/23/2015   Grade 1 by Echocardiogram 12/13/14  . Heart murmur   . Hemorrhoids   . History of kidney stones   . Hyperlipidemia 04/01/2011  . Hypertension   . Moderate aortic regurgitation 09/25/2008   12/13/14 Echocardiogram    . PANCREATITIS, CHRONIC 03/29/2006  . Peripheral arterial disease ([facility])   . Pneumonia    age [age]  . RENAL INSUFFICIENCY, CHRONIC 03/15/2006    Patient Active Problem List   Diagnosis Date Noted  . Tubular adenoma of colon 06/30/2016  . Diverticulosis of colon without hemorrhage 06/30/2016  .  Postmenopausal vaginal bleeding   . Long term (current) use of opiate analgesic 04/09/2016  . Bilateral carotid artery disease ([facility]) 11/28/2015  . Pancreatic insufficiency 05/09/2015  . 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 Risk for coronary artery disease greater than 20% in next 10 years 03/14/2015  . Diastolic dysfunction 01/23/2015  . Vulvar cyst 01/23/2015  . Abnormal uterine bleeding 12/02/2014  . Unexplained night sweats 12/02/2014  . Constipation due to opioid therapy 06/05/2014  . Loss of weight 03/29/2014  . Peripheral arterial disease ([facility]) 12/12/2013  . GERD (gastroesophageal reflux disease) 10/12/2012  . Healthcare maintenance 09/29/2012  . Back pain 03/28/2012  . Hyperlipidemia 04/01/2011  . BOILS, RECURRENT 03/13/2010  . Moderate aortic regurgitation 09/25/2008  . HEMORRHOIDS 06/08/2008  . Osteoarthritis 05/06/2007  . TOBACCO ABUSE 09/27/2006  . History of osteoporosis 06/29/2006  . Chronic pancreatitis ([facility]) 03/29/2006  . Uncontrolled type 2 diabetes mellitus with chronic kidney disease, without long-term current use of insulin ([facility]) 03/15/2006  . Diabetic neuropathy, painful ([facility]) 03/15/2006  . Anemia of chronic disease 03/15/2006  . Essential hypertension 03/15/2006  . VENTRICULAR HYPERTROPHY, LEFT 03/15/2006  . CKD stage 3 due to type 2 diabetes mellitus ([facility]) 03/15/2006    Past Surgical History:  Procedure Laterality Date  . BREAST SURGERY    . COLONOSCOPY    . DILATION AND CURETTAGE OF UTERUS N/A 06/16/2016   Procedure: DILATATION AND CURETTAGE;  Surgeon: Catalina Antigua, MD;  Location: [facility];  Service: Gynecology;  Laterality: N/A;  . HYSTEROSCOPY  N/A 06/16/2016   Procedure: HYSTEROSCOPY;  Surgeon: Mora Bellman, MD;  Location: [facility];  Service: Gynecology;  Laterality: N/A;  . MOUTH SURGERY N/A   . POLYPECTOMY N/A 06/16/2016   Procedure: POLYPECTOMY;  Surgeon: Mora Bellman, MD;  Location: [facility];  Service: Gynecology;  Laterality: N/A;     OB History    Gravida Para Term Preterm AB Living   _0 SAB TAB Ectopic Multiple Live Births           3       Home Medications    Prior to Admission medications   Medication Sig Start Date End Date Taking? Authorizing Provider  ACCU-CHEK AVIVA PLUS test strip TEST three times a day 11/25/15   Lucious Groves, DO  amLODipine (NORVASC) 5 MG tablet take 1 tablet by mouth once daily 08/03/16   Lucious Groves, DO  aspirin EC 81 MG tablet Take 1 tablet (81 mg total) by mouth daily. 04/09/16   Lucious Groves, DO  atorvastatin (LIPITOR) 40 MG tablet Take 1 tablet (40 mg total) by mouth daily. Patient not taking: Reported on 10/20/2016 03/25/16 03/25/17  Lucious Groves, DO  bisacodyl (DULCOLAX) 5 MG EC tablet Take 10 mg by mouth daily as needed for moderate constipation.    [provider]  Blood Glucose Monitoring Suppl (ACCU-CHEK AVIVA PLUS) w/Device KIT Use to check you blood sugar 3 times a day 06/04/15   Oval Linsey, MD  ciprofloxacin (CIPRO) 500 MG tablet Take 1 tablet (500 mg total) by mouth 2 (two) times daily. One po bid x 7 days 10/24/16   Sherwood Gambler, MD  docusate sodium (COLACE) 100 MG capsule Take 1 capsule (100 mg total) by mouth 2 (two) times daily as needed. 06/16/16   Constant, Peggy, MD  hydroxypropyl methylcellulose / hypromellose (ISOPTO TEARS / GONIOVISC) 2.5 % ophthalmic solution Place 1 drop into both eyes as needed for dry eyes.    [provider]  Linaclotide (LINZESS PO) Take 1 capsule by mouth daily.    [provider]  losartan (COZAAR) 100 MG tablet Take 1 tablet (100 mg total) by mouth daily. 08/12/16   Lucious Groves, DO  metFORMIN (GLUCOPHAGE-XR) 500 MG 24 hr tablet Take 1 tablet (500 mg total) by mouth daily with breakfast. 04/10/16   Lucious Groves, DO  metroNIDAZOLE (FLAGYL) 500 MG tablet Take 1 tablet (500 mg total) by mouth 2 (two) times daily. One po bid x 7 days 10/24/16   Sherwood Gambler, MD  Multiple Vitamin  (MULTIVITAMIN WITH MINERALS) TABS tablet Take 1 tablet by mouth daily. One-a-Day Women's 50+    [provider]  Pancrelipase, Lip-Prot-Amyl, 24000-76000 units CPEP Take 4 capsules (96,000 Units total) by mouth See admin instructions. Take 4 capsules by mouth 2-3 times daily before meals Patient taking differently: Take 72,000-96,000 Units by mouth See admin instructions. Pt takes before meals depends on how much is eating 03/10/16   Joni Reining C, DO  polyethylene glycol (MIRALAX / GLYCOLAX) packet Take 17 g by mouth daily as needed for mild constipation. Patient not taking: Reported on 10/24/2016 10/24/15   Oval Linsey, MD  silver sulfADIAZINE (SILVADENE) 1 % cream Apply 1 application topically daily. Patient not taking: Reported on 10/20/2016 02/25/16   Gean Birchwood, DPM  sitaGLIPtin (JANUVIA) 50 MG tablet Take 1 tablet (50 mg total) by mouth daily. 09/10/16   Lucious Groves, DO    Family  History Family History  Problem Relation Age of Onset  . Dementia Mother     Social History Social History  Substance Use Topics  . Smoking status: Current Some Day Smoker    Packs/day: 0.50    Years: 44.00    Types: Cigarettes  . Smokeless tobacco: Never Used  . Alcohol use 0.0 oz/week     Comment: occ     Allergies   Gabapentin and Nicorette [nicotine]   Review of Systems Review of Systems  Constitutional:       Per HPI, otherwise negative  HENT:       Per HPI, otherwise negative  Respiratory:       Per HPI, otherwise negative  Cardiovascular:       Per HPI, otherwise negative  Gastrointestinal: Negative for vomiting.  Endocrine:       Negative aside from HPI  Genitourinary:       Neg aside from HPI   Musculoskeletal:       Per HPI, otherwise negative  Skin: Negative.   Neurological: Negative for syncope.     Physical Exam Updated Vital Signs BP (!) 118/57 (BP Location: Right Arm)   Pulse 69   Temp 98.3 F (36.8 C) (Oral)   Resp 16   Ht 5' 5" (1.651 m)    Wt 55.8 kg (123 lb)   LMP 02/22/2016   SpO2 100%   BMI 20.47 kg/m   Physical Exam  Constitutional: She is oriented to person, place, and time. She appears well-developed and well-nourished. No distress.  HENT:  Head: Normocephalic and atraumatic.  Eyes: Conjunctivae and EOM are normal.  Cardiovascular: Normal rate and regular rhythm.   Pulmonary/Chest: Effort normal and breath sounds normal. No stridor. No respiratory distress.  Abdominal: She exhibits no distension. There is no tenderness. There is no guarding.  Musculoskeletal: She exhibits no edema.  Neurological: She is alert and oriented to person, place, and time. No cranial nerve deficit.  Skin: Skin is warm and dry.  Psychiatric: She has a normal mood and affect.  Nursing note and vitals reviewed.    ED Treatments / Results  Labs (all labs ordered are listed, but only abnormal results are displayed) Labs Reviewed  CBG MONITORING, ED - Abnormal; Notable for the following:       Result Value   Glucose-Capillary 170 (*)    All other components within normal limits     Procedures Procedures (including critical care time)  Medications Ordered in ED Medications - No data to display   Initial Impression / Assessment and Plan / ED Course  I have reviewed the triage vital signs and the nursing notes.  Pertinent labs & imaging results that were available during my care of the patient were reviewed by me and considered in my medical decision making (see chart for details).     Chart review notable for colitis diagnosis 4 days ago.  Update: Patient in no distress. Labs reviewed, no evidence for hypoglycemia, and with no ongoing complaints, reassuring physical exam, there is some suspicion for the patient's change from normal feeling being more likely related to her antibiotics, as there is no evidence for critical [place] abnormalities including hypoglycemia. With no substantial changes, reassuring findings, patient  discharged in stable condition with close outpatient follow-up to discuss medications.  Final Clinical Impressions(s) / ED Diagnoses  weakness   Carmin Muskrat, MD 10/28/16 [phone]

## 2016-10-28 NOTE — ED Triage Notes (Signed)
Pt concerned about low blood sugars, stating that she's been checking it at home today and it would go up and down all day. Pt reports the lowest she saw was 104. The highest she saw was 116. Pt came here because her sugar should've gone up a lot after eating her dinner which included chicken and cake. She reports taking her medication as prescribed.

## 2016-10-28 NOTE — Discharge Instructions (Signed)
As discussed, your evaluation today has been largely reassuring.  But, it is important that you monitor your condition carefully, and do not hesitate to return to the ED if you develop new, or concerning changes in your condition. ? ?Otherwise, please follow-up with your physician for appropriate ongoing care. ? ?

## 2016-11-05 ENCOUNTER — Ambulatory Visit (INDEPENDENT_AMBULATORY_CARE_PROVIDER_SITE_OTHER): Payer: Medicaid Other | Admitting: Internal Medicine

## 2016-11-05 ENCOUNTER — Encounter: Payer: Self-pay | Admitting: Internal Medicine

## 2016-11-05 VITALS — BP 90/50 | HR 65 | Wt 124.0 lb

## 2016-11-05 DIAGNOSIS — R109 Unspecified abdominal pain: Secondary | ICD-10-CM | POA: Diagnosis not present

## 2016-11-05 DIAGNOSIS — N289 Disorder of kidney and ureter, unspecified: Secondary | ICD-10-CM | POA: Diagnosis not present

## 2016-11-05 DIAGNOSIS — K5900 Constipation, unspecified: Secondary | ICD-10-CM | POA: Diagnosis not present

## 2016-11-05 DIAGNOSIS — K8689 Other specified diseases of pancreas: Secondary | ICD-10-CM

## 2016-11-05 DIAGNOSIS — K861 Other chronic pancreatitis: Secondary | ICD-10-CM

## 2016-11-05 NOTE — Patient Instructions (Signed)
I will contact you to schedule an appointment with [facility].  You will be due for a CT in November.

## 2016-11-05 NOTE — Progress Notes (Signed)
HISTORY OF PRESENT ILLNESS:  [name] is a 66 y.o. female with multiple significant medical problems as listed below. She was last evaluated 09/18/2016 regarding left-sided lower abdominal and flank pain, right constipation, chronic pancreatitis with pancreatic insufficiency, ongoing alcohol abuse with abnormal liver tests, and weight loss. She presents today for routine follow-up as requested. She was told increase MiraLAX. She did secure a new PCP who prescribed linzess for constipation. This has helped. She tells me that she stopped drinking alcohol about one month ago. She went to the emergency room twice since her visit here regarding abdominal pain on 1 occasion and chest pain with shortness of breath on another occasion. She was treated with some antibiotics for possible colitis or diverticulitis which she states helped her lower abdominal pain. She continues to lose weight. No obvious cancer on advanced abdominal imaging or chest x-ray. CT scan performed 09/23/2016 revealed no acute abnormalities. She was noted to have numerous scattered tiny hypervascular foci throughout both hepatic lobes best appreciated on arterial phase imaging. These were too small to characterize and summer felt to be present on prior exam in 2016. While these were felt to be likely benign, follow-up imaging in 3 months recommended. The patient is aware and we have made a recall. Patient continues to be concerned about her weight loss. She denies steatorrhea. She is on pancreatic enzyme supplementation. I reviewed her workup. She does have pancytopenia but has had this chronically. She was told by the emergency room that she has renal insufficiency and should see a kidney doctor. She asks if I can help arrange this. Most recent GFR 52. Finally, her most recent colonoscopy was completed May 2018 with diminutive adenoma and pandiverticulosis.  REVIEW OF SYSTEMS:  All non-GI ROS negative except for arthritis  Past Medical  History:  Diagnosis Date  . ANEMIA, CHRONIC DISEASE NEC 03/15/2006  . Colon polyps   . Constipation due to opioid therapy 06/05/2014  . DEGENERATIVE JOINT DISEASE 05/06/2007   On going for >5 yrs No imaging to confirm Has tried ultram, mobic, neurontin which did not work Ambulance person tried once worked well    . Diabetes mellitus   . DIABETIC  RETINOPATHY 03/15/2006  . 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 Diastolic dysfunction 41/93/7902   Grade 1 by Echocardiogram 12/13/14  . Heart murmur   . Hemorrhoids   . History of kidney stones   . Hyperlipidemia 04/01/2011  . Hypertension   . Moderate aortic regurgitation 09/25/2008   12/13/14 Echocardiogram    . PANCREATITIS, CHRONIC 03/29/2006  . Peripheral arterial disease ([place])   . Pneumonia    age 29-20  . RENAL INSUFFICIENCY, CHRONIC 03/15/2006    Past Surgical History:  Procedure Laterality Date  . BREAST SURGERY    . COLONOSCOPY    . DILATION AND CURETTAGE OF UTERUS N/A 06/16/2016   Procedure: DILATATION AND CURETTAGE;  Surgeon: Mora Bellman, MD;  Location: [facility];  Service: Gynecology;  Laterality: N/A;  . HYSTEROSCOPY N/A 06/16/2016   Procedure: HYSTEROSCOPY;  Surgeon: Mora Bellman, MD;  Location: [facility];  Service: Gynecology;  Laterality: N/A;  . MOUTH SURGERY N/A   . POLYPECTOMY N/A 06/16/2016   Procedure: POLYPECTOMY;  Surgeon: Mora Bellman, MD;  Location: [facility];  Service: Gynecology;  Laterality: N/A;    Social History Herma C Voland  reports that she has been smoking Cigarettes.  She has a 22.00 pack-year smoking history. She has never used smokeless tobacco. She reports that she drinks alcohol. She  reports that she does not use drugs.  family history includes Dementia in her mother.  Allergies  Allergen Reactions  . Gabapentin Swelling    Legs and feet  . Nicorette [Nicotine] Nausea Only       PHYSICAL EXAMINATION: Vital signs: BP (!) 90/50   Pulse 65   Wt 124 lb (56.2 kg)   LMP 02/22/2016   BMI 20.63 kg/m    Constitutional: Thin, but otherwise generally well-appearing, no acute distress Psychiatric: alert and oriented x3, cooperative. Somewhat flat affect Eyes: extraocular movements intact, anicteric, conjunctiva pink Mouth: oral pharynx moist, no lesions Neck: supple Lymph: no lymphadenopathy Cardiovascular: heart regular rate and rhythm, no murmur Lungs: clear to auscultation bilaterally Abdomen: soft, nontender, nondistended, no obvious ascites, no peritoneal signs, normal bowel sounds, no organomegaly Rectal: Omitted Extremities: no clubbing cyanosis or lower extremity edema bilaterally Skin: no lesions on visible extremities Neuro: No focal deficits. Cranial nerves intact. No asterixis.    ASSESSMENT:  #1. Chronic constipation. Improved with linzess and MiraLAX #2. 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 On pancreatic enzyme replacement. Does not describe steatorrhea #3. Lower abdominal pain. Improved after antibiotics. Possibly mild diverticulitis #4. Abnormal hepatic imaging of uncertain clinical significance. Follow-up recommended  #5. Elevated liver tests secondary to alcohol #6. Chronic alcohol abuse #7. History of adenomatous colon polyps #8. Multiple medical problems including renal insufficiency  PLAN:  #1. Continue linzess and MiraLAX as needed for constipation #2. Continue to abstain from all alcohol #3. Increased pancreatic enzymes with meals to see if this helps with weight loss #4. Follow-up hepatic imaging around November #5. Referral to nephrology per her request #6. Surveillance colonoscopy 5 years #7. Routine GI follow-up 6 months #8. Interval follow-up as needed  25 minutes spent face-to-face with the patient. Greater than 50% a time use for counseling regarding her myriad GI issues and correlation of care

## 2016-11-10 ENCOUNTER — Other Ambulatory Visit: Payer: Self-pay

## 2016-11-10 ENCOUNTER — Ambulatory Visit: Payer: Self-pay

## 2016-11-13 ENCOUNTER — Telehealth: Payer: Self-pay | Admitting: Internal Medicine

## 2016-11-13 NOTE — Telephone Encounter (Signed)
Patient states she thought someone was suppose to be calling her to sch at [facility]. Please see ov note from 11/05/16 and advise.

## 2016-11-16 ENCOUNTER — Ambulatory Visit (INDEPENDENT_AMBULATORY_CARE_PROVIDER_SITE_OTHER): Payer: Medicaid Other | Admitting: Podiatry

## 2016-11-16 ENCOUNTER — Encounter: Payer: Self-pay | Admitting: Podiatry

## 2016-11-16 DIAGNOSIS — E1140 Type 2 diabetes mellitus with diabetic neuropathy, unspecified: Secondary | ICD-10-CM

## 2016-11-16 DIAGNOSIS — M79676 Pain in unspecified toe(s): Secondary | ICD-10-CM

## 2016-11-16 DIAGNOSIS — E11622 Type 2 diabetes mellitus with other skin ulcer: Secondary | ICD-10-CM

## 2016-11-16 DIAGNOSIS — L98499 Non-pressure chronic ulcer of skin of other sites with unspecified severity: Principal | ICD-10-CM

## 2016-11-16 DIAGNOSIS — L97511 Non-pressure chronic ulcer of other part of right foot limited to breakdown of skin: Secondary | ICD-10-CM

## 2016-11-16 DIAGNOSIS — B351 Tinea unguium: Secondary | ICD-10-CM

## 2016-11-16 DIAGNOSIS — I739 Peripheral vascular disease, unspecified: Secondary | ICD-10-CM

## 2016-11-16 NOTE — Patient Instructions (Signed)
Apply Silvadene cream to the skin ulcer on the right foot daily until all drainage stops  Diabetes and Foot Care Diabetes may cause you to have problems because of poor blood supply (circulation) to your feet and legs. This may cause the skin on your feet to become thinner, break easier, and heal more slowly. Your skin may become dry, and the skin may peel and crack. You may also have nerve damage in your legs and feet causing decreased feeling in them. You may not notice minor injuries to your feet that could lead to infections or more serious problems. Taking care of your feet is one of the most important things you can do for yourself. Follow these instructions at home:  Wear shoes at all times, even in the house. Do not go barefoot. Bare feet are easily injured.  Check your feet daily for blisters, cuts, and redness. If you cannot see the bottom of your feet, use a mirror or ask someone for help.  Wash your feet with warm water (do not use hot water) and mild soap. Then pat your feet and the areas between your toes until they are completely dry. Do not soak your feet as this can dry your skin.  Apply a moisturizing lotion or petroleum jelly (that does not contain alcohol and is unscented) to the skin on your feet and to dry, brittle toenails. Do not apply lotion between your toes.  Trim your toenails straight across. Do not dig under them or around the cuticle. File the edges of your nails with an emery board or nail file.  Do not cut corns or calluses or try to remove them with medicine.  Wear clean socks or stockings every day. Make sure they are not too tight. Do not wear knee-high stockings since they may decrease blood flow to your legs.  Wear shoes that fit properly and have enough cushioning. To break in new shoes, wear them for just a few hours a day. This prevents you from injuring your feet. Always look in your shoes before you put them on to be sure there are no objects  inside.  Do not cross your legs. This may decrease the blood flow to your feet.  If you find a minor scrape, cut, or break in the skin on your feet, keep it and the skin around it clean and dry. These areas may be cleansed with mild soap and water. Do not cleanse the area with peroxide, alcohol, or iodine.  When you remove an adhesive bandage, be sure not to damage the skin around it.  If you have a wound, look at it several times a day to make sure it is healing.  Do not use heating pads or hot water bottles. They may burn your skin. If you have lost feeling in your feet or legs, you may not know it is happening until it is too late.  Make sure your health care provider performs a complete foot exam at least annually or more often if you have foot problems. Report any cuts, sores, or bruises to your health care provider immediately. Contact a health care provider if:  You have an injury that is not healing.  You have cuts or breaks in the skin.  You have an ingrown nail.  You notice redness on your legs or feet.  You feel burning or tingling in your legs or feet.  You have pain or cramps in your legs and feet.  Your legs  or feet are numb.  Your feet always feel cold. Get help right away if:  There is increasing redness, swelling, or pain in or around a wound.  There is a red line that goes up your leg.  Pus is coming from a wound.  You develop a fever or as directed by your health care provider.  You notice a bad smell coming from an ulcer or wound. This information is not intended to replace advice given to you by your health care provider. Make sure you discuss any questions you have with your health care provider. Document Released: 02/07/2000 Document Revised: 07/18/2015 Document Reviewed: 07/19/2012 Elsevier Interactive Patient Education  2017 [employer].

## 2016-11-16 NOTE — Progress Notes (Signed)
Patient ID: [name], female   DOB: 1950/04/26, 66 y.o.   MRN: [id number]    Subjective: This patient presents for ongoing maintenance for pre-ulcerative or ulcerative plantar skin lesions on right and left feet. plantar fifth right MPJ as well as ongoing debridement of pre-ulcerative calluses.Patient is a known diabetic with a history of peripheral arterial disease and neuropathy Patient continue to smoke. The superficial ulcer on the visit of 06/23/2016 was 5 mm in diameter  Objective: Objective: Orientated 3 DP pulses 2/4 bilaterally PT pulses trace palpable bilaterally Capillary reflex immediate bilaterally Sensation to 10 g monofilament wire intact 5/5 bilaterally Vibratory sensation reactive bilaterally Ankle reflex equal and reactive bilaterally 5 mm superficial ulcer with granular base fifth right .The ulcer surrounded by hyperkeratotic tissue. There is no surrounding erythema, edema, drainage, warmth or malodor Pre-ulcerative plantar calluses first MPJ bilaterally and fifth left MPJ, bilaterally Nucleated keratoses plantar left heel Hypertrophic elongated toenails 6-10  Assessment: Diabetic with peripheral neuropathy and peripheral arterial disease Pre-ulcerative plantar calluses  4 Superficial ulcer fifth right MPJ without clinical sign of infection  Plan: Debrided mycotic toenails 6-10 mechanically and left without any bleeding Debrided pre-ulcerative plantar keratoses 4 without any bleeding Debrided ulcer plantar right apply Silvadene dressing Patient will apply Silvadene dressing to the skin ulcer daily until drainage scans Patient will wear athletic style shoes with custom insoles  Reappoint 3 weeks

## 2016-11-17 NOTE — Telephone Encounter (Signed)
All documentation has been faxed to [facility] in order to schedule appointment.

## 2016-11-23 ENCOUNTER — Telehealth: Payer: Self-pay | Admitting: Internal Medicine

## 2016-11-23 NOTE — Telephone Encounter (Signed)
Pt states that she saw her PCP last week for her abdominal pain and was told she may have colitis and she should see her GI doctor. Pt requesting appt. Pt scheduled to see Ellouise Newer PA Thursday 11/26/16@1 :15pm. Pt aware of appt.

## 2016-11-26 ENCOUNTER — Ambulatory Visit (INDEPENDENT_AMBULATORY_CARE_PROVIDER_SITE_OTHER): Payer: Medicaid Other | Admitting: Physician Assistant

## 2016-11-26 ENCOUNTER — Encounter: Payer: Self-pay | Admitting: Physician Assistant

## 2016-11-26 VITALS — BP 130/60 | HR 80 | Ht 65.0 in | Wt 130.0 lb

## 2016-11-26 DIAGNOSIS — R1032 Left lower quadrant pain: Secondary | ICD-10-CM

## 2016-11-26 MED ORDER — METRONIDAZOLE 500 MG PO TABS: 500.0000 mg | ORAL_TABLET | Freq: Three times a day (TID) | ORAL | 0 refills | Status: DC

## 2016-11-26 MED ORDER — CIPROFLOXACIN HCL 500 MG PO TABS: 500.0000 mg | ORAL_TABLET | Freq: Two times a day (BID) | ORAL | 0 refills | Status: DC

## 2016-11-26 NOTE — Progress Notes (Signed)
Chief Complaint: LLQ pain  HPI:  [name] is a 66 year old female with a past medical history listed below who follows with Dr. Henrene Pastor and presents to clinic today for complaint of left lower quadrant abdominal pain.    Patient was recently seen in clinic 11/05/16 by Dr. Henrene Pastor and at that time she was following up on her chronic alcoholic pancreatitis. It was discussed that the patient had been treated in the emergency room 10/24/16 for colitis seen on CT. Patient was given Ciprofloxacin and Flagyl for 7 days. Patient's most recent colonoscopy was completed May 2018 with diminutive adenoma and pandiverticulosis. It was discussed that the patient had chronic constipation which was improved with Linzess and MiraLAX. Her chronic alcoholic pancreatitis with history of pancreatic insufficiency was controlled. Her lower abdominal pain had improved after antibiotics and was thought to represent possibly mild diverticulitis. She was referred to a nephrologist regarding recent increase in creatinine and told to follow in clinic in 6 months.     Today, the patient presents to clinic accompanied by her young granddaughter. She explains that the antibiotics helped when she was taking them but about 2-3 weeks ago she started again with a left lower quadrant abdominal pain which radiates through to her back. Patient tells me this "comes and goes". Sometimes this is worse right before a bowel movement and feels better after, and other times she will get sharp pains in this area irregardless of a bowel movement. Patient tells me this pain is very similar to the pain she was experiencing when she was seen in the ED and was told she had colitis.   Patient does ask questions regarding her referral to nephrology. Apparently, she has not been called to set up an appointment yet.   Patient denies fever, chills, blood in her stool, melena, weight loss, anorexia, change in bowel habits, nausea, vomiting, heartburn, reflux or  symptoms that awaken her at night.   Past Medical History:  Diagnosis Date  . ANEMIA, CHRONIC DISEASE NEC 03/15/2006  . Colon polyps   . Constipation due to opioid therapy 06/05/2014  . DEGENERATIVE JOINT DISEASE 05/06/2007   On going for >5 yrs No imaging to confirm Has tried ultram, mobic, neurontin which did not work Ambulance person tried once worked well    . Diabetes mellitus   . DIABETIC  RETINOPATHY 03/15/2006  . 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 Diastolic dysfunction 25/85/2778   Grade 1 by Echocardiogram 12/13/14  . Heart murmur   . Hemorrhoids   . History of kidney stones   . Hyperlipidemia 04/01/2011  . Hypertension   . Moderate aortic regurgitation 09/25/2008   12/13/14 Echocardiogram    . PANCREATITIS, CHRONIC 03/29/2006  . Peripheral arterial disease ([place])   . Pneumonia    age 26-20  . RENAL INSUFFICIENCY, CHRONIC 03/15/2006    Past Surgical History:  Procedure Laterality Date  . BREAST SURGERY    . COLONOSCOPY    . DILATION AND CURETTAGE OF UTERUS N/A 06/16/2016   Procedure: DILATATION AND CURETTAGE;  Surgeon: Mora Bellman, MD;  Location: [facility];  Service: Gynecology;  Laterality: N/A;  . HYSTEROSCOPY N/A 06/16/2016   Procedure: HYSTEROSCOPY;  Surgeon: Mora Bellman, MD;  Location: [facility];  Service: Gynecology;  Laterality: N/A;  . MOUTH SURGERY N/A   . POLYPECTOMY N/A 06/16/2016   Procedure: POLYPECTOMY;  Surgeon: Mora Bellman, MD;  Location: [facility];  Service: Gynecology;  Laterality: N/A;    Current Outpatient Prescriptions  Medication Sig Dispense Refill  .  ACCU-CHEK AVIVA PLUS test strip TEST three times a day 100 each 11  . amLODipine (NORVASC) 5 MG tablet take 1 tablet by mouth once daily 30 tablet 1  . aspirin EC 81 MG tablet Take 1 tablet (81 mg total) by mouth daily.    Marland Kitchen atorvastatin (LIPITOR) 40 MG tablet Take 1 tablet (40 mg total) by mouth daily. 90 tablet 3  . bisacodyl (DULCOLAX) 5 MG EC tablet Take 10 mg by mouth daily as needed for moderate  constipation.    . Blood Glucose Monitoring Suppl (ACCU-CHEK AVIVA PLUS) w/Device KIT Use to check you blood sugar 3 times a day 1 kit 1  . docusate sodium (COLACE) 100 MG capsule Take 1 capsule (100 mg total) by mouth 2 (two) times daily as needed. 30 capsule 2  . hydroxypropyl methylcellulose / hypromellose (ISOPTO TEARS / GONIOVISC) 2.5 % ophthalmic solution Place 1 drop into both eyes as needed for dry eyes.    . Linaclotide (LINZESS PO) Take 1 capsule by mouth daily.    Marland Kitchen losartan (COZAAR) 100 MG tablet Take 1 tablet (100 mg total) by mouth daily. 90 tablet 1  . metFORMIN (GLUCOPHAGE-XR) 500 MG 24 hr tablet Take 1 tablet (500 mg total) by mouth daily with breakfast. 90 tablet 3  . Multiple Vitamin (MULTIVITAMIN WITH MINERALS) TABS tablet Take 1 tablet by mouth daily. One-a-Day Women's 50+    . Pancrelipase, Lip-Prot-Amyl, 24000-76000 units CPEP Take 4 capsules (96,000 Units total) by mouth See admin instructions. Take 4 capsules by mouth 2-3 times daily before meals (Patient taking differently: Take 72,000-96,000 Units by mouth See admin instructions. Pt takes before meals depends on how much is eating) 1170 capsule 3  . polyethylene glycol (MIRALAX / GLYCOLAX) packet Take 17 g by mouth daily as needed for mild constipation. 14 each 3  . silver sulfADIAZINE (SILVADENE) 1 % cream Apply 1 application topically daily. 50 g 0  . sitaGLIPtin (JANUVIA) 50 MG tablet Take 1 tablet (50 mg total) by mouth daily. 12 tablet 0   No current facility-administered medications for this visit.     Allergies as of 11/26/2016 - Review Complete 11/26/2016  Allergen Reaction Noted  . Gabapentin Swelling 04/20/2015  . Nicorette [nicotine] Nausea Only 04/20/2015    Family History  Problem Relation Age of Onset  . Dementia Mother   . Colon cancer Neg Hx     Social History   Social History  . Marital status: Single    Spouse name: N/A  . Number of children: 3  . Years of education: N/A   Occupational  History  . retired- disabled    Social History Main Topics  . Smoking status: Current Some Day Smoker    Packs/day: 0.50    Years: 44.00    Types: Cigarettes  . Smokeless tobacco: Never Used     Comment: tobacco info given 11/26/16  . Alcohol use 0.0 oz/week     Comment: occ  . Drug use: No  . Sexual activity: Not on file   Other Topics Concern  . Not on file   Social History Narrative  . No narrative on file    Review of Systems:    Constitutional: No weight loss, fever or chills Cardiovascular: No chest pain Respiratory: No SOB  Gastrointestinal: See HPI and otherwise negative   Physical Exam:  Vital signs: BP 130/60   Pulse 80   Ht 5\' 5"  (1.651 m)   Wt 130 lb (59 kg)   LMP  02/22/2016   BMI 21.63 kg/m   Constitutional:   Pleasant Caucasian female appears to be in NAD, Well developed, Well nourished, alert and cooperative Respiratory: Respirations even and unlabored. Lungs clear to auscultation bilaterally.   No wheezes, crackles, or rhonchi.  Cardiovascular: Normal S1, S2. No MRG. Regular rate and rhythm. No peripheral edema, cyanosis or pallor.  Gastrointestinal:  Soft, nondistended, mod LLQ ttp. No rebound or guarding. Normal bowel sounds. No appreciable masses or hepatomegaly. Psychiatric: Oriented to person, place and time. Demonstrates good judgement and reason without abnormal affect or behaviors.  MOST RECENT LABS AND IMAGING: CBC    Component Value Date/Time   WBC 3.8 (L) 10/24/2016 1831   RBC 3.52 (L) 10/24/2016 1831   HGB 10.7 (L) 10/24/2016 1831   HCT 30.4 (L) 10/24/2016 1831   PLT 127 (L) 10/24/2016 1831   MCV 86.4 10/24/2016 1831   MCH 30.4 10/24/2016 1831   MCHC 35.2 10/24/2016 1831   RDW 14.2 10/24/2016 1831   LYMPHSABS 1.8 10/24/2016 1831   MONOABS 0.1 10/24/2016 1831   EOSABS 0.2 10/24/2016 1831   BASOSABS 0.0 10/24/2016 1831    CMP     Component Value Date/Time   NA 136 10/24/2016 1831   NA 140 04/09/2016 0932   K 3.7 10/24/2016  1831   CL 108 10/24/2016 1831   CO2 22 10/24/2016 1831   GLUCOSE 137 (H) 10/24/2016 1831   BUN 47 (H) 10/24/2016 1831   BUN 36 (H) 04/09/2016 0932   CREATININE 1.30 (H) 10/24/2016 1831   CREATININE 1.35 (H) 05/03/2014 1522   CALCIUM 9.4 10/24/2016 1831   PROT 7.4 10/24/2016 1831   ALBUMIN 3.9 10/24/2016 1831   AST 60 (H) 10/24/2016 1831   ALT 89 (H) 10/24/2016 1831   ALKPHOS 82 10/24/2016 1831   BILITOT 0.2 (L) 10/24/2016 1831   GFRNONAA 42 (L) 10/24/2016 1831   GFRNONAA 42 (L) 05/03/2014 1522   GFRAA 49 (L) 10/24/2016 1831   GFRAA 49 (L) 05/03/2014 1522   EXAM: CT ABDOMEN AND PELVIS WITH CONTRAST 10/24/16  TECHNIQUE: Multidetector CT imaging of the abdomen and pelvis was performed using the standard protocol following bolus administration of intravenous contrast.  CONTRAST:  75 mL Isovue 300 intravenous  COMPARISON:  09/23/2016, pelvic ultrasound 04/18/2016, CT 05/28/2014  FINDINGS: Lower chest: Lung bases demonstrate no acute consolidation or pleural effusion. Heart size is normal.  Hepatobiliary: Previously demonstrated hyperenhancing foci in the liver on 09/23/2016 CT are not well visualized today. Surgical clips at the gallbladder fossa. Stable appearance of prominent extrahepatic bile duct likely due to postsurgical change.  Pancreas: Atrophic pancreas with multiple calcifications consistent with chronic pancreatitis.  Spleen: Normal in size without focal abnormality.  Adrenals/Urinary Tract: Adrenal glands are within normal limits. Subcentimeter cortical hypodensities in both kidneys too small to further characterize. Bladder normal  Stomach/Bowel: Stomach is nonenlarged. No dilated small bowel. Suspected diffuse colon wall thickening. Appendix poorly visualized but no right lower quadrant inflammation  Vascular/Lymphatic: Aortic atherosclerosis. No enlarged abdominal or pelvic lymph nodes.  Reproductive: Thickened endometrial stripe up to 9  mm. No adnexal mass small enhancing nodules in the uterus, probably fibroids.  Other: Negative for free air or free fluid  Musculoskeletal: Grade 1 anterolisthesis of L4 on L5 with degenerative changes. No acute or suspicious bone lesion.  IMPRESSION: 1. Suspect that there is diffuse colon wall thickening, as may be seen with a colitis of infectious or inflammatory etiology. C difficile colitis may also be considered in the appropriate  clinical setting. 2. Findings consistent with chronic pancreatitis 3. Thickening of the endometrial stripe up to 8 mm. Clinical correlation recommended as epic record indicates hysteroscopy April 2018. 4. Hyperenhancing foci in the liver noted on 09/23/2016 CT are not well appreciated today, please refer to the prior report for additional recommendations   Electronically Signed   By: Donavan Foil M.D.   On: 10/24/2016 21:42  Assessment: 1. LLQ abdominal pain: Concern for continued colitis, patient did improve after 7 days of Cipro Flagyl but the pain has since returned, no rectal bleeding, fevers or weight loss; likely return of suspected diverticulitis/colitis  Plan: 1. Prescribed Ciprofloxacin 500 mg twice a day 14 days. Prescribed Flagyl 500 mg 3 times a day 14 days. 2. If patient is no better after antibiotic regimen above would recommend a repeat CT of the abdomen and pelvis with contrast 3. Recommend the patient follow a low fiber/ low residue diet for now 4. Discussed nephrology referral. Will follow up with this. 5.  Patient to return to clinic with me in 3-4 weeks or sooner if necessary.  Ellouise Newer, PA-C [facility] 11/26/2016, 1:18 PM  Cc: [facility]

## 2016-11-26 NOTE — Patient Instructions (Addendum)
  You have been given a separate informational sheet regarding your tobacco use, the importance of quitting and local resources to help you quit.   We have sent the following medications to your pharmacy for you to pick up at your convenience: Cipro 500 mg twice a day  Flagyl 500 mg three times a day

## 2016-11-26 NOTE — Progress Notes (Signed)
Assessment and plans reviewed  

## 2016-11-26 NOTE — Telephone Encounter (Signed)
Left message with Amber at [facility] to find out status of referral.

## 2016-11-27 ENCOUNTER — Ambulatory Visit
Admission: RE | Admit: 2016-11-27 | Discharge: 2016-11-27 | Disposition: A | Discharge status: Home / Self Care | Payer: Medicaid Other | Source: Ambulatory Visit | Attending: Internal Medicine | Admitting: Internal Medicine

## 2016-11-27 DIAGNOSIS — E2839 Other primary ovarian failure: Secondary | ICD-10-CM

## 2016-11-27 DIAGNOSIS — Z1231 Encounter for screening mammogram for malignant neoplasm of breast: Secondary | ICD-10-CM

## 2016-11-27 NOTE — Telephone Encounter (Signed)
Spoke with Museum/gallery conservator at [facility] who told me they were working through the list of all the people they had to call to schedule appointments and would be contacting her as soon as possible.  Called to tell patient but no answer no voicemail

## 2016-12-01 ENCOUNTER — Telehealth: Payer: Self-pay | Admitting: Physician Assistant

## 2016-12-01 NOTE — Telephone Encounter (Signed)
I do not believe this pain is coming from the medication, but she can stop these medications now if she wishes. Otherwise can continue for 2 more days which would make it a week of these abx instead of 2 weeks as prescribed. If she chooses to stop, would recommend that if she continues with LLQ pain at that time, we would need to repeat CT as discussed. Thanks-JLL

## 2016-12-01 NOTE — Telephone Encounter (Signed)
Pt states she saw Ellouise Newer PA 11/26/16 and was placed on cipro and flagyl again. Pt reports that since then she has been having back pain off and on. Last night states the pain was really bad. She read the paperwork from the pharmacy and states it mentioned that if your kidneys are not good you should not take these meds. Pain is gone this morning but she wanted to check and see if she should continue the meds or not. Please advise.

## 2016-12-01 NOTE — Telephone Encounter (Signed)
Patient notified of recommendations. She will call back for continued LLQ pain

## 2016-12-15 ENCOUNTER — Ambulatory Visit (INDEPENDENT_AMBULATORY_CARE_PROVIDER_SITE_OTHER): Payer: Medicaid Other | Admitting: Podiatry

## 2016-12-15 ENCOUNTER — Encounter: Payer: Self-pay | Admitting: Podiatry

## 2016-12-15 VITALS — BP 128/64 | HR 73

## 2016-12-15 DIAGNOSIS — E11622 Type 2 diabetes mellitus with other skin ulcer: Secondary | ICD-10-CM

## 2016-12-15 DIAGNOSIS — L98499 Non-pressure chronic ulcer of skin of other sites with unspecified severity: Secondary | ICD-10-CM

## 2016-12-15 DIAGNOSIS — I739 Peripheral vascular disease, unspecified: Secondary | ICD-10-CM

## 2016-12-15 NOTE — Progress Notes (Signed)
Patient ID: [name], female   DOB: [birth year], 66 y.o.   MRN: [id number]    Subjective: This patient presents for ongoing maintenance for pre-ulcerative or ulcerative plantar skin lesions on right and left feet. plantar fifth right MPJ as well as ongoing debridement of pre-ulcerative calluses.Patient is a known diabetic with a history of peripheral arterial disease and neuropathy Patient continue to smoke. The superficial ulcer on the visit of 06/23/2016 was 5 mm in diameter  Objective: Orientated 3 DP pulses 2/4 bilaterally PT pulses trace palpable bilaterally Capillary reflex immediate bilaterally Sensation to 10 g monofilament wire intact 5/5 bilaterally Vibratory sensation reactive bilaterally Ankle reflex equal and reactive bilaterally 5 mm superficial ulcer with granular base fifth right .The ulcer surrounded by hyperkeratotic tissue. There is no surrounding erythema, edema, drainage, warmth or malodor Pre-ulcerative plantar calluses first MPJ bilaterally and fifth left MPJ, bilaterally Nucleated keratoses plantar left heel Hypertrophic elongated toenails 6-10  Assessment: Diabetic with peripheral neuropathy and peripheral arterial disease Pre-ulcerative plantar calluses  4 Superficial ulcer fifth right MPJ without clinical sign of infection  Plan: Debrided mycotic toenails 6-10 mechanically and left without any bleeding Debrided pre-ulcerative plantar keratoses 4 without any bleeding Debrided ulcer plantar right apply Silvadene dressing Patient will apply Silvadene dressing to the skin ulcer daily until drainage scans Patient will wear athletic style shoes with custom insoles  Reappoint 4 weeks

## 2016-12-15 NOTE — Patient Instructions (Signed)
Apply Silvadene cream to skin ulcer on the right foot daily and cover with Band-Aid or gauze dressing until all drainage stops   Diabetes and Foot Care Diabetes may cause you to have problems because of poor blood supply (circulation) to your feet and legs. This may cause the skin on your feet to become thinner, break easier, and heal more slowly. Your skin may become dry, and the skin may peel and crack. You may also have nerve damage in your legs and feet causing decreased feeling in them. You may not notice minor injuries to your feet that could lead to infections or more serious problems. Taking care of your feet is one of the most important things you can do for yourself. Follow these instructions at home:  Wear shoes at all times, even in the house. Do not go barefoot. Bare feet are easily injured.  Check your feet daily for blisters, cuts, and redness. If you cannot see the bottom of your feet, use a mirror or ask someone for help.  Wash your feet with warm water (do not use hot water) and mild soap. Then pat your feet and the areas between your toes until they are completely dry. Do not soak your feet as this can dry your skin.  Apply a moisturizing lotion or petroleum jelly (that does not contain alcohol and is unscented) to the skin on your feet and to dry, brittle toenails. Do not apply lotion between your toes.  Trim your toenails straight across. Do not dig under them or around the cuticle. File the edges of your nails with an emery board or nail file.  Do not cut corns or calluses or try to remove them with medicine.  Wear clean socks or stockings every day. Make sure they are not too tight. Do not wear knee-high stockings since they may decrease blood flow to your legs.  Wear shoes that fit properly and have enough cushioning. To break in new shoes, wear them for just a few hours a day. This prevents you from injuring your feet. Always look in your shoes before you put them on  to be sure there are no objects inside.  Do not cross your legs. This may decrease the blood flow to your feet.  If you find a minor scrape, cut, or break in the skin on your feet, keep it and the skin around it clean and dry. These areas may be cleansed with mild soap and water. Do not cleanse the area with peroxide, alcohol, or iodine.  When you remove an adhesive bandage, be sure not to damage the skin around it.  If you have a wound, look at it several times a day to make sure it is healing.  Do not use heating pads or hot water bottles. They may burn your skin. If you have lost feeling in your feet or legs, you may not know it is happening until it is too late.  Make sure your health care provider performs a complete foot exam at least annually or more often if you have foot problems. Report any cuts, sores, or bruises to your health care provider immediately. Contact a health care provider if:  You have an injury that is not healing.  You have cuts or breaks in the skin.  You have an ingrown nail.  You notice redness on your legs or feet.  You feel burning or tingling in your legs or feet.  You have pain or cramps in your  legs and feet.  Your legs or feet are numb.  Your feet always feel cold. Get help right away if:  There is increasing redness, swelling, or pain in or around a wound.  There is a red line that goes up your leg.  Pus is coming from a wound.  You develop a fever or as directed by your health care provider.  You notice a bad smell coming from an ulcer or wound. This information is not intended to replace advice given to you by your health care provider. Make sure you discuss any questions you have with your health care provider. Document Released: 02/07/2000 Document Revised: 07/18/2015 Document Reviewed: 07/19/2012 Elsevier Interactive Patient Education  2017 [employer].

## 2016-12-23 ENCOUNTER — Ambulatory Visit: Payer: Medicaid Other | Admitting: Physician Assistant

## 2016-12-30 ENCOUNTER — Other Ambulatory Visit: Payer: Self-pay

## 2016-12-30 ENCOUNTER — Encounter (HOSPITAL_COMMUNITY): Payer: Self-pay | Admitting: Emergency Medicine

## 2016-12-30 DIAGNOSIS — E11319 Type 2 diabetes mellitus with unspecified diabetic retinopathy without macular edema: Secondary | ICD-10-CM | POA: Diagnosis not present

## 2016-12-30 DIAGNOSIS — E11649 Type 2 diabetes mellitus with hypoglycemia without coma: Secondary | ICD-10-CM | POA: Diagnosis not present

## 2016-12-30 DIAGNOSIS — Z7984 Long term (current) use of oral hypoglycemic drugs: Secondary | ICD-10-CM | POA: Insufficient documentation

## 2016-12-30 DIAGNOSIS — Z7982 Long term (current) use of aspirin: Secondary | ICD-10-CM | POA: Diagnosis not present

## 2016-12-30 DIAGNOSIS — E1140 Type 2 diabetes mellitus with diabetic neuropathy, unspecified: Secondary | ICD-10-CM | POA: Insufficient documentation

## 2016-12-30 DIAGNOSIS — R197 Diarrhea, unspecified: Secondary | ICD-10-CM | POA: Diagnosis not present

## 2016-12-30 DIAGNOSIS — E162 Hypoglycemia, unspecified: Secondary | ICD-10-CM | POA: Diagnosis present

## 2016-12-30 DIAGNOSIS — F17210 Nicotine dependence, cigarettes, uncomplicated: Secondary | ICD-10-CM | POA: Insufficient documentation

## 2016-12-30 DIAGNOSIS — N189 Chronic kidney disease, unspecified: Secondary | ICD-10-CM | POA: Insufficient documentation

## 2016-12-30 DIAGNOSIS — I129 Hypertensive chronic kidney disease with stage 1 through stage 4 chronic kidney disease, or unspecified chronic kidney disease: Secondary | ICD-10-CM | POA: Diagnosis not present

## 2016-12-30 LAB — CBG MONITORING, ED: Glucose-Capillary: 171 mg/dL — ABNORMAL HIGH (ref 65–99)

## 2016-12-30 NOTE — ED Triage Notes (Signed)
Pt states that she has been unable to control her blood sugars all day. States that the lowest was 45 when she woke up. She also reports diahrreha over the last 2 days. Currently CBG in triage 171

## 2016-12-31 ENCOUNTER — Emergency Department (HOSPITAL_COMMUNITY)
Admission: EM | Admit: 2016-12-31 | Discharge: 2016-12-31 | Disposition: A | Discharge status: Home / Self Care | Payer: Medicare Other | Attending: Emergency Medicine | Admitting: Emergency Medicine

## 2016-12-31 DIAGNOSIS — R197 Diarrhea, unspecified: Secondary | ICD-10-CM | POA: Diagnosis not present

## 2016-12-31 DIAGNOSIS — E162 Hypoglycemia, unspecified: Secondary | ICD-10-CM

## 2016-12-31 DIAGNOSIS — N289 Disorder of kidney and ureter, unspecified: Secondary | ICD-10-CM

## 2016-12-31 LAB — CBC WITH DIFFERENTIAL/PLATELET
BASOS ABS: 0 10*3/uL (ref 0.0–0.1)
Basophils Relative: 0 %
Eosinophils Absolute: 0.3 10*3/uL (ref 0.0–0.7)
Eosinophils Relative: 6 %
HEMATOCRIT: 30.9 % — AB (ref 36.0–46.0)
HEMOGLOBIN: 10.3 g/dL — AB (ref 12.0–15.0)
LYMPHS PCT: 37 %
Lymphs Abs: 1.7 10*3/uL (ref 0.7–4.0)
MCH: 29.7 pg (ref 26.0–34.0)
MCHC: 33.3 g/dL (ref 30.0–36.0)
MCV: 89 fL (ref 78.0–100.0)
MONO ABS: 0.3 10*3/uL (ref 0.1–1.0)
MONOS PCT: 7 %
NEUTROS ABS: 2.2 10*3/uL (ref 1.7–7.7)
NEUTROS PCT: 50 %
Platelets: 129 10*3/uL — ABNORMAL LOW (ref 150–400)
RBC: 3.47 MIL/uL — ABNORMAL LOW (ref 3.87–5.11)
RDW: 15.1 % (ref 11.5–15.5)
WBC: 4.5 10*3/uL (ref 4.0–10.5)

## 2016-12-31 LAB — COMPREHENSIVE METABOLIC PANEL
ALBUMIN: 3.7 g/dL (ref 3.5–5.0)
ALT: 138 U/L — ABNORMAL HIGH (ref 14–54)
AST: 100 U/L — ABNORMAL HIGH (ref 15–41)
Alkaline Phosphatase: 119 U/L (ref 38–126)
Anion gap: 6 (ref 5–15)
BUN: 38 mg/dL — AB (ref 6–20)
CHLORIDE: 109 mmol/L (ref 101–111)
CO2: 20 mmol/L — ABNORMAL LOW (ref 22–32)
Calcium: 8.7 mg/dL — ABNORMAL LOW (ref 8.9–10.3)
Creatinine, Ser: 1.21 mg/dL — ABNORMAL HIGH (ref 0.44–1.00)
GFR calc Af Amer: 54 mL/min — ABNORMAL LOW (ref >60)
GFR calc non Af Amer: 46 mL/min — ABNORMAL LOW (ref >60)
GLUCOSE: 170 mg/dL — AB (ref 65–99)
POTASSIUM: 4 mmol/L (ref 3.5–5.1)
Sodium: 135 mmol/L (ref 135–145)
Total Bilirubin: 0.4 mg/dL (ref 0.3–1.2)
Total Protein: 6.8 g/dL (ref 6.5–8.1)

## 2016-12-31 LAB — CBG MONITORING, ED: Glucose-Capillary: 173 mg/dL — ABNORMAL HIGH (ref 65–99)

## 2016-12-31 LAB — LIPASE, BLOOD: LIPASE: 17 U/L (ref 11–51)

## 2016-12-31 MED ORDER — SODIUM CHLORIDE 0.9 % IV BOLUS (SEPSIS)
1000.0000 mL | Freq: Once | INTRAVENOUS | Status: AC
  Administered 2016-12-31: 1000 mL via INTRAVENOUS

## 2016-12-31 MED ORDER — LOPERAMIDE HCL 2 MG PO CAPS: 2.0000 mg | ORAL_CAPSULE | Freq: Four times a day (QID) | ORAL | 0 refills | Status: DC | PRN

## 2016-12-31 MED ORDER — LOPERAMIDE HCL 2 MG PO CAPS
4.0000 mg | ORAL_CAPSULE | Freq: Once | ORAL | Status: AC
  Administered 2016-12-31: 4 mg via ORAL
  Filled 2016-12-31: qty 2

## 2016-12-31 NOTE — ED Provider Notes (Signed)
[facility] Provider Note   CSN: [id number] Arrival date & time: 12/30/16  1902     History   Chief Complaint Chief Complaint  Patient presents with  . Hypoglycemia    HPI [name] is a 66 y.o. female.  The history is provided by the patient.  She came in because of low blood sugar.  She is diabetic, and checked her blood sugar when she woke up this morning and found it was 45.  She ate something and the glucose went up over 100, but, an hour later, it was back down to 76 so she came to the emergency department.  She has been having diarrhea for the last 4 days.  She is having approximately 5-8 bowel movements a day.  There is no blood or mucus in the stool.  She denies abdominal pain.  She denies nausea or vomiting.  There have been no fever or chills.  She is complaining of pain in her mid back and wonders if it might be from her pancreas.  She does have history of chronic pancreatitis.  Past Medical History:  Diagnosis Date  . ANEMIA, CHRONIC DISEASE NEC 03/15/2006  . Colon polyps   . Constipation due to opioid therapy 06/05/2014  . DEGENERATIVE JOINT DISEASE 05/06/2007   On going for >5 yrs No imaging to confirm Has tried ultram, mobic, neurontin which did not work Ambulance person tried once worked well    . Diabetes mellitus   . DIABETIC  RETINOPATHY 03/15/2006  . 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 Diastolic dysfunction 01/60/1093   Grade 1 by Echocardiogram 12/13/14  . Heart murmur   . Hemorrhoids   . History of kidney stones   . Hyperlipidemia 04/01/2011  . Hypertension   . Moderate aortic regurgitation 09/25/2008   12/13/14 Echocardiogram    . PANCREATITIS, CHRONIC 03/29/2006  . Peripheral arterial disease ([place])   . Pneumonia    age 18-20  . RENAL INSUFFICIENCY, CHRONIC 03/15/2006    Patient Active Problem List   Diagnosis Date Noted  . Tubular adenoma of colon 06/30/2016  . Diverticulosis of colon without hemorrhage  06/30/2016  . Postmenopausal vaginal bleeding   . Long term (current) use of opiate analgesic 04/09/2016  . Bilateral carotid artery disease ([place]) 11/28/2015  . Pancreatic insufficiency 05/09/2015  . 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 Risk for coronary artery disease greater than 20% in next 10 years 03/14/2015  . Diastolic dysfunction 23/55/7322  . Vulvar cyst 01/23/2015  . Abnormal uterine bleeding 12/02/2014  . Unexplained night sweats 12/02/2014  . Constipation due to opioid therapy 06/05/2014  . Loss of weight 03/29/2014  . Peripheral arterial disease ([place]) 12/12/2013  . GERD (gastroesophageal reflux disease) 10/12/2012  . Healthcare maintenance 09/29/2012  . Back pain 03/28/2012  . Hyperlipidemia 04/01/2011  . BOILS, RECURRENT 03/13/2010  . Moderate aortic regurgitation 09/25/2008  . HEMORRHOIDS 06/08/2008  . Osteoarthritis 05/06/2007  . TOBACCO ABUSE 09/27/2006  . History of osteoporosis 06/29/2006  . Chronic pancreatitis ([place]) 03/29/2006  . Uncontrolled type 2 diabetes mellitus with chronic kidney disease, without long-term current use of insulin ([place]) 03/15/2006  . Diabetic neuropathy, painful ([place]) 03/15/2006  . Anemia of chronic disease 03/15/2006  . Essential hypertension 03/15/2006  . VENTRICULAR HYPERTROPHY, LEFT 03/15/2006  . CKD stage 3 due to type 2 diabetes mellitus ([place]) 03/15/2006    Past Surgical History:  Procedure Laterality Date  . BREAST SURGERY    . COLONOSCOPY    .  MOUTH SURGERY N/A     OB History    Gravida Para Term Preterm AB Living   3 3 3     3    SAB TAB Ectopic Multiple Live Births           3       Home Medications    Prior to Admission medications   Medication Sig Start Date End Date Taking? Authorizing Provider  ACCU-CHEK AVIVA PLUS test strip TEST three times a day 11/25/15   Lucious Groves, DO  amLODipine (NORVASC) 5 MG tablet take 1 tablet by mouth once daily 08/03/16   Lucious Groves, DO  aspirin EC 81 MG  tablet Take 1 tablet (81 mg total) by mouth daily. 04/09/16   Lucious Groves, DO  atorvastatin (LIPITOR) 40 MG tablet Take 1 tablet (40 mg total) by mouth daily. 03/25/16 03/25/17  Lucious Groves, DO  bisacodyl (DULCOLAX) 5 MG EC tablet Take 10 mg by mouth daily as needed for moderate constipation.    [provider]  Blood Glucose Monitoring Suppl (ACCU-CHEK AVIVA PLUS) w/Device KIT Use to check you blood sugar 3 times a day 06/04/15   Oval Linsey, MD  ciprofloxacin (CIPRO) 500 MG tablet Take 1 tablet (500 mg total) by mouth 2 (two) times daily. 11/26/16   Levin Erp, PA  docusate sodium (COLACE) 100 MG capsule Take 1 capsule (100 mg total) by mouth 2 (two) times daily as needed. 06/16/16   Constant, Peggy, MD  hydroxypropyl methylcellulose / hypromellose (ISOPTO TEARS / GONIOVISC) 2.5 % ophthalmic solution Place 1 drop into both eyes as needed for dry eyes.    [provider]  Linaclotide (LINZESS PO) Take 1 capsule by mouth daily.    [provider]  losartan (COZAAR) 100 MG tablet Take 1 tablet (100 mg total) by mouth daily. 08/12/16   Lucious Groves, DO  metFORMIN (GLUCOPHAGE-XR) 500 MG 24 hr tablet Take 1 tablet (500 mg total) by mouth daily with breakfast. 04/10/16   Lucious Groves, DO  metroNIDAZOLE (FLAGYL) 500 MG tablet Take 1 tablet (500 mg total) by mouth 3 (three) times daily. 11/26/16   Levin Erp, PA  Multiple Vitamin (MULTIVITAMIN WITH MINERALS) TABS tablet Take 1 tablet by mouth daily. One-a-Day Women's 50+    [provider]  Pancrelipase, Lip-Prot-Amyl, 24000-76000 units CPEP Take 4 capsules (96,000 Units total) by mouth See admin instructions. Take 4 capsules by mouth 2-3 times daily before meals Patient taking differently: Take 72,000-96,000 Units by mouth See admin instructions. Pt takes before meals depends on how much is eating 03/10/16   Joni Reining C, DO  polyethylene glycol (MIRALAX / GLYCOLAX) packet Take 17 g by  mouth daily as needed for mild constipation. 10/24/15   Oval Linsey, MD  silver sulfADIAZINE (SILVADENE) 1 % cream Apply 1 application topically daily. 02/25/16   Tuchman, Richard C, DPM  sitaGLIPtin (JANUVIA) 50 MG tablet Take 1 tablet (50 mg total) by mouth daily. 09/10/16   Lucious Groves, DO    Family History Family History  Problem Relation Age of Onset  . Dementia Mother   . Colon cancer Neg Hx     Social History Social History   Tobacco Use  . Smoking status: Current Some Day Smoker    Packs/day: 0.50    Years: 44.00    Pack years: 22.00    Types: Cigarettes  . Smokeless tobacco: Never Used  . Tobacco comment: tobacco info given 11/26/16  Substance Use Topics  . Alcohol use: Yes    Alcohol/week: 0.0 oz    Comment: occ  . Drug use: No     Allergies   Gabapentin and Nicorette [nicotine]   Review of Systems Review of Systems  All other systems reviewed and are negative.    Physical Exam Updated Vital Signs BP (!) 132/55 (BP Location: Left Arm)   Pulse 81   Temp 97.9 F (36.6 C) (Oral)   Resp 18   Ht 5' 5"  (1.651 m)   Wt 58.1 kg (128 lb)   LMP 02/22/2016   SpO2 100%   BMI 21.30 kg/m   Physical Exam  Nursing note and vitals reviewed.  66 year old female, resting comfortably and in no acute distress. Vital signs are normal. Oxygen saturation is 100%, which is normal. Head is normocephalic and atraumatic. PERRLA, EOMI. Oropharynx is clear. Neck is nontender and supple without adenopathy or JVD. Back is nontender and there is no CVA tenderness. Lungs are clear without rales, wheezes, or rhonchi. Chest is nontender. Heart has regular rate and rhythm without murmur. Abdomen is soft, flat, nontender without masses or hepatosplenomegaly and peristalsis is normoactive. Extremities have no cyanosis or edema, full range of motion is present. Skin is warm and dry without rash. Neurologic: Mental status is normal, cranial nerves are intact, there are no motor  or sensory deficits.  ED Treatments / Results  Labs (all labs ordered are listed, but only abnormal results are displayed) Labs Reviewed  CBG MONITORING, ED - Abnormal; Notable for the following components:      Result Value   Glucose-Capillary 171 (*)    All other components within normal limits   Procedures Procedures (including critical care time)  Medications Ordered in ED Medications  sodium chloride 0.9 % bolus 1,000 mL (1,000 mLs Intravenous New Bag/Given 12/31/16 0116)  loperamide (IMODIUM) capsule 4 mg (4 mg Oral Given 12/31/16 0117)     Initial Impression / Assessment and Plan / ED Course  I have reviewed the triage vital signs and the nursing notes.  Pertinent lab results that were available during my care of the patient were reviewed by me and considered in my medical decision making (see chart for details).  Diarrhea of uncertain cause.  Probably viral enteritis.  No red flags to suggest more serious illness.  Reported episode of hypoglycemia at home-glucose acceptable in the emergency department.  No hypoglycemic episodes since arrival.  Back pain of uncertain cause.  Will check screening labs and give dose of oral loperamide.  Old records are reviewed, and she does have prior ED visits and hospitalizations for hypoglycemia.  She feels better after above-noted treatment.  Glucose has been stable in the emergency department.  She is discharged with prescription for loperamide.  Advised to take her glucose monitor with her when she sees PCP that readings on her monitor can be compared with her provider's monitor.  Final Clinical Impressions(s) / ED Diagnoses   Final diagnoses:  Diarrhea of presumed infectious origin  Hypoglycemia  Renal insufficiency    ED Discharge Orders        Ordered    loperamide (IMODIUM) 2 MG capsule  4 times daily PRN     28/31/51 7616       Delora Fuel, MD 07/37/10 [id number]

## 2016-12-31 NOTE — Discharge Instructions (Signed)
The next time you see your doctor, take your glucose monitor with you.  Compare your monitors reading with what your doctor gets.

## 2017-01-12 ENCOUNTER — Encounter: Payer: Self-pay | Admitting: Podiatry

## 2017-01-12 ENCOUNTER — Ambulatory Visit: Payer: Medicare Other | Admitting: Podiatry

## 2017-01-12 VITALS — BP 137/65 | HR 68

## 2017-01-12 DIAGNOSIS — E11622 Type 2 diabetes mellitus with other skin ulcer: Secondary | ICD-10-CM

## 2017-01-12 DIAGNOSIS — E1140 Type 2 diabetes mellitus with diabetic neuropathy, unspecified: Secondary | ICD-10-CM

## 2017-01-12 DIAGNOSIS — B351 Tinea unguium: Secondary | ICD-10-CM | POA: Diagnosis not present

## 2017-01-12 DIAGNOSIS — L98499 Non-pressure chronic ulcer of skin of other sites with unspecified severity: Secondary | ICD-10-CM

## 2017-01-12 DIAGNOSIS — I739 Peripheral vascular disease, unspecified: Secondary | ICD-10-CM

## 2017-01-12 NOTE — Progress Notes (Signed)
Patient ID: [name], female   DOB: 11-07-1950, 66 y.o.   MRN: [id number]   Subjective: This patient presents for ongoing maintenance for pre-ulcerative or ulcerative plantar skin lesions on right and left feet. plantar fifth right MPJ as well as ongoing debridement of pre-ulcerative calluses.Patient is a known diabetic with a history of peripheral arterial disease and neuropathy Patient also complaining of thickened and elongated toenails and request toenail debridement Patient continue to smoke.   Objective: Orientated 3 DP pulses 2/4 bilaterally PT pulses trace palpable bilaterally Capillary reflex immediate bilaterally Sensation to 10 g monofilament wire intact 5/5 bilaterally Vibratory sensation reactive bilaterally Ankle reflex equal and reactive bilaterally 5 mm superficial ulcer with granular base fifth right .The ulcer surrounded by hyperkeratotic tissue. There is no surrounding erythema, edema, drainage, warmth or malodor Pre-ulcerative plantar calluses first MPJ bilaterally and fifth left MPJ, bilaterally Nucleated keratoses plantar left heel Hypertrophic elongated toenails 6-10  Assessment: Diabetic with peripheral neuropathy and peripheral arterial disease Pre-ulcerative plantar calluses  4 Superficial ulcer fifth right MPJ without clinical sign of infection  Plan: Debrided mycotic toenails 6-10 mechanically and left without any bleeding Debrided pre-ulcerative plantar keratoses 4 without any bleeding Debrided ulcer plantar right apply Silvadene dressing Patient will apply Silvadene dressing to the skin ulcer daily until drainage scans Patient will wear athletic style shoes with custom insoles  Schedule visit with pedorthotist for diabetic shoes Certification for diabetic shoes: Diabetic peripheral angiopathy Foot ulceration sub-metatarsal right Diabetes with polyneuropathy Diminished posterior tibial pulses bilaterally  Reappoint 3weeks

## 2017-01-12 NOTE — Patient Instructions (Signed)
Apply Silvadene cream to skin ulcer on the bottom right foot daily and cover with a gauze dressing until healed  Diabetes and Foot Care Diabetes may cause you to have problems because of poor blood supply (circulation) to your feet and legs. This may cause the skin on your feet to become thinner, break easier, and heal more slowly. Your skin may become dry, and the skin may peel and crack. You may also have nerve damage in your legs and feet causing decreased feeling in them. You may not notice minor injuries to your feet that could lead to infections or more serious problems. Taking care of your feet is one of the most important things you can do for yourself. Follow these instructions at home:  Wear shoes at all times, even in the house. Do not go barefoot. Bare feet are easily injured.  Check your feet daily for blisters, cuts, and redness. If you cannot see the bottom of your feet, use a mirror or ask someone for help.  Wash your feet with warm water (do not use hot water) and mild soap. Then pat your feet and the areas between your toes until they are completely dry. Do not soak your feet as this can dry your skin.  Apply a moisturizing lotion or petroleum jelly (that does not contain alcohol and is unscented) to the skin on your feet and to dry, brittle toenails. Do not apply lotion between your toes.  Trim your toenails straight across. Do not dig under them or around the cuticle. File the edges of your nails with an emery board or nail file.  Do not cut corns or calluses or try to remove them with medicine.  Wear clean socks or stockings every day. Make sure they are not too tight. Do not wear knee-high stockings since they may decrease blood flow to your legs.  Wear shoes that fit properly and have enough cushioning. To break in new shoes, wear them for just a few hours a day. This prevents you from injuring your feet. Always look in your shoes before you put them on to be sure there are  no objects inside.  Do not cross your legs. This may decrease the blood flow to your feet.  If you find a minor scrape, cut, or break in the skin on your feet, keep it and the skin around it clean and dry. These areas may be cleansed with mild soap and water. Do not cleanse the area with peroxide, alcohol, or iodine.  When you remove an adhesive bandage, be sure not to damage the skin around it.  If you have a wound, look at it several times a day to make sure it is healing.  Do not use heating pads or hot water bottles. They may burn your skin. If you have lost feeling in your feet or legs, you may not know it is happening until it is too late.  Make sure your health care provider performs a complete foot exam at least annually or more often if you have foot problems. Report any cuts, sores, or bruises to your health care provider immediately. Contact a health care provider if:  You have an injury that is not healing.  You have cuts or breaks in the skin.  You have an ingrown nail.  You notice redness on your legs or feet.  You feel burning or tingling in your legs or feet.  You have pain or cramps in your legs and feet.  Your legs or feet are numb.  Your feet always feel cold. Get help right away if:  There is increasing redness, swelling, or pain in or around a wound.  There is a red line that goes up your leg.  Pus is coming from a wound.  You develop a fever or as directed by your health care provider.  You notice a bad smell coming from an ulcer or wound. This information is not intended to replace advice given to you by your health care provider. Make sure you discuss any questions you have with your health care provider. Document Released: 02/07/2000 Document Revised: 07/18/2015 Document Reviewed: 07/19/2012 Elsevier Interactive Patient Education  2017 [employer].

## 2017-01-27 ENCOUNTER — Other Ambulatory Visit: Payer: Medicare Other

## 2017-02-02 ENCOUNTER — Ambulatory Visit: Payer: Medicare Other | Admitting: Podiatry

## 2017-02-05 ENCOUNTER — Ambulatory Visit: Payer: Medicare Other | Admitting: Podiatry

## 2017-02-12 ENCOUNTER — Other Ambulatory Visit: Payer: Self-pay

## 2017-02-12 ENCOUNTER — Ambulatory Visit (INDEPENDENT_AMBULATORY_CARE_PROVIDER_SITE_OTHER): Payer: Medicare Other | Admitting: Podiatry

## 2017-02-12 ENCOUNTER — Encounter (HOSPITAL_COMMUNITY): Payer: Self-pay

## 2017-02-12 ENCOUNTER — Emergency Department (HOSPITAL_COMMUNITY): Payer: Medicare Other

## 2017-02-12 ENCOUNTER — Encounter: Payer: Self-pay | Admitting: Podiatry

## 2017-02-12 ENCOUNTER — Emergency Department (HOSPITAL_COMMUNITY)
Admission: EM | Admit: 2017-02-12 | Discharge: 2017-02-12 | Disposition: A | Discharge status: Home / Self Care | Payer: Medicare Other | Attending: Emergency Medicine | Admitting: Emergency Medicine

## 2017-02-12 VITALS — BP 128/62 | HR 82 | Resp 16

## 2017-02-12 DIAGNOSIS — L97511 Non-pressure chronic ulcer of other part of right foot limited to breakdown of skin: Secondary | ICD-10-CM | POA: Diagnosis not present

## 2017-02-12 DIAGNOSIS — L84 Corns and callosities: Secondary | ICD-10-CM

## 2017-02-12 DIAGNOSIS — Z7984 Long term (current) use of oral hypoglycemic drugs: Secondary | ICD-10-CM | POA: Diagnosis not present

## 2017-02-12 DIAGNOSIS — F17210 Nicotine dependence, cigarettes, uncomplicated: Secondary | ICD-10-CM | POA: Insufficient documentation

## 2017-02-12 DIAGNOSIS — N183 Chronic kidney disease, stage 3 (moderate): Secondary | ICD-10-CM | POA: Insufficient documentation

## 2017-02-12 DIAGNOSIS — Z7982 Long term (current) use of aspirin: Secondary | ICD-10-CM | POA: Insufficient documentation

## 2017-02-12 DIAGNOSIS — M549 Dorsalgia, unspecified: Secondary | ICD-10-CM | POA: Diagnosis present

## 2017-02-12 DIAGNOSIS — E1122 Type 2 diabetes mellitus with diabetic chronic kidney disease: Secondary | ICD-10-CM | POA: Insufficient documentation

## 2017-02-12 DIAGNOSIS — I2510 Atherosclerotic heart disease of native coronary artery without angina pectoris: Secondary | ICD-10-CM | POA: Insufficient documentation

## 2017-02-12 DIAGNOSIS — I129 Hypertensive chronic kidney disease with stage 1 through stage 4 chronic kidney disease, or unspecified chronic kidney disease: Secondary | ICD-10-CM | POA: Insufficient documentation

## 2017-02-12 DIAGNOSIS — Z79899 Other long term (current) drug therapy: Secondary | ICD-10-CM | POA: Insufficient documentation

## 2017-02-12 DIAGNOSIS — M546 Pain in thoracic spine: Secondary | ICD-10-CM | POA: Diagnosis not present

## 2017-02-12 LAB — CBC
HEMATOCRIT: 29.1 % — AB (ref 36.0–46.0)
HEMOGLOBIN: 9.8 g/dL — AB (ref 12.0–15.0)
MCH: 29.3 pg (ref 26.0–34.0)
MCHC: 33.7 g/dL (ref 30.0–36.0)
MCV: 87.1 fL (ref 78.0–100.0)
Platelets: 124 10*3/uL — ABNORMAL LOW (ref 150–400)
RBC: 3.34 MIL/uL — AB (ref 3.87–5.11)
RDW: 14.2 % (ref 11.5–15.5)
WBC: 3.7 10*3/uL — ABNORMAL LOW (ref 4.0–10.5)

## 2017-02-12 LAB — BASIC METABOLIC PANEL
Anion gap: 8 (ref 5–15)
BUN: 25 mg/dL — ABNORMAL HIGH (ref 6–20)
CHLORIDE: 105 mmol/L (ref 101–111)
CO2: 21 mmol/L — AB (ref 22–32)
Calcium: 9 mg/dL (ref 8.9–10.3)
Creatinine, Ser: 1.23 mg/dL — ABNORMAL HIGH (ref 0.44–1.00)
GFR calc non Af Amer: 45 mL/min — ABNORMAL LOW (ref >60)
GFR, EST AFRICAN AMERICAN: 52 mL/min — AB (ref >60)
Glucose, Bld: 270 mg/dL — ABNORMAL HIGH (ref 65–99)
Potassium: 3.8 mmol/L (ref 3.5–5.1)
Sodium: 134 mmol/L — ABNORMAL LOW (ref 135–145)

## 2017-02-12 LAB — I-STAT TROPONIN, ED: Troponin i, poc: 0 ng/mL (ref 0.00–0.08)

## 2017-02-12 NOTE — ED Notes (Signed)
Patient transported to X-ray 

## 2017-02-12 NOTE — ED Triage Notes (Signed)
Pt states she initially had pain in her right shoulder, radiating to her neck, chest, and back. Pt states she has cardiac hx. Pain has been occurring X3 days. Skin warm and dry, no distress noted.

## 2017-02-12 NOTE — Discharge Instructions (Signed)
X-ray of your lungs shows pneumonitis, this is what happens when the lungs become inflamed.  This will take time to heal.  Please take Tylenol for pain.  It is very important that you stop smoking as this will help with the inflammation.  Please use your albuterol inhaler 2 puffs every 4 hours.  Your blood sugar was elevated in the emergency department today, please have this rechecked.  Please call your primary doctor and schedule an appointment for recheck next week regarding your emergency department visit today.  Return to the emergency department if you have fever greater than 100.4 F and cough, chest pain that radiates to the left shoulder or jaw, chest pain with shortness of breath, chest pain with nausea and vomiting.  Return to the emergency department if you have any new or worsening symptoms.

## 2017-02-12 NOTE — ED Provider Notes (Signed)
[facility] EMERGENCY DEPARTMENT Provider Note   CSN: [id number] Arrival date & time: 02/12/17  1038     History   Chief Complaint Chief Complaint  Patient presents with  . Shoulder Pain    HPI [name] is a 66 y.o. female.  HPI   [name] is a 66yo female with a history of uncontrolled type 2 diabetes, CKD stage III, tobacco use, hypertension, hyperlipidemia, peripheral arterial disease, osteoarthritis who presents to the emergency department for evaluation of back and right shoulder pain.  Patient states that she has had intermittent back pain for the past 2 weeks now. Pain is constant, "sharp" and worsened with deep breathing, particularly after taking a deep inhale of her cigarette. She states that her pain radiates to the right shoulder at times.  She denies recent injury.  States that she has taken tramadol and Tylenol for her pain without significant relief. She denies cough, fever, congestion, headache, visual changes, N/V, abdominal pain. She denies history of DVT/PE, denies exogenous estrogen use, denies recent surgery or immobilization, denies leg swelling.  She also reports that last night she had 5/10 severity dull substernal chest pain which was nonradiating.  She denies associated shortness of breath, diaphoresis, lightheadedness.  Denies radiation to left arm, shoulder or jaw. Denies history of MI.  Has not had chest pain since last night.  Per chart review patient had an echocardiogram 11/2014 which showed EF of 65-70%.  It showed left ventricular hypertrophy and moderate regurgitation of the aortic valve.  Past Medical History:  Diagnosis Date  . ANEMIA, CHRONIC DISEASE NEC 03/15/2006  . Colon polyps   . Constipation due to opioid therapy 06/05/2014  . DEGENERATIVE JOINT DISEASE 05/06/2007   On going for >5 yrs No imaging to confirm Has tried ultram, mobic, neurontin which did not work Ambulance person tried once worked well    . Diabetes mellitus   .  DIABETIC  RETINOPATHY 03/15/2006  . 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 Diastolic dysfunction 31/49/7026   Grade 1 by Echocardiogram 12/13/14  . Heart murmur   . Hemorrhoids   . History of kidney stones   . Hyperlipidemia 04/01/2011  . Hypertension   . Moderate aortic regurgitation 09/25/2008   12/13/14 Echocardiogram    . PANCREATITIS, CHRONIC 03/29/2006  . Peripheral arterial disease ([place])   . Pneumonia    age 41-20  . RENAL INSUFFICIENCY, CHRONIC 03/15/2006    Patient Active Problem List   Diagnosis Date Noted  . Tubular adenoma of colon 06/30/2016  . Diverticulosis of colon without hemorrhage 06/30/2016  . Postmenopausal vaginal bleeding   . Long term (current) use of opiate analgesic 04/09/2016  . Bilateral carotid artery disease ([place]) 11/28/2015  . Pancreatic insufficiency 05/09/2015  . 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 Risk for coronary artery disease greater than 20% in next 10 years 03/14/2015  . Diastolic dysfunction 37/85/8850  . Vulvar cyst 01/23/2015  . Abnormal uterine bleeding 12/02/2014  . Unexplained night sweats 12/02/2014  . Constipation due to opioid therapy 06/05/2014  . Loss of weight 03/29/2014  . Peripheral arterial disease ([place]) 12/12/2013  . GERD (gastroesophageal reflux disease) 10/12/2012  . Healthcare maintenance 09/29/2012  . Back pain 03/28/2012  . Hyperlipidemia 04/01/2011  . BOILS, RECURRENT 03/13/2010  . Moderate aortic regurgitation 09/25/2008  . HEMORRHOIDS 06/08/2008  . Osteoarthritis 05/06/2007  . TOBACCO ABUSE 09/27/2006  . History of osteoporosis 06/29/2006  . Chronic pancreatitis ([place]) 03/29/2006  . Uncontrolled type 2 diabetes  mellitus with chronic kidney disease, without long-term current use of insulin ([place]) 03/15/2006  . Diabetic neuropathy, painful ([place]) 03/15/2006  . Anemia of chronic disease 03/15/2006  . Essential hypertension 03/15/2006  . VENTRICULAR HYPERTROPHY, LEFT 03/15/2006  . CKD stage 3 due to  type 2 diabetes mellitus ([place]) 03/15/2006    Past Surgical History:  Procedure Laterality Date  . BREAST SURGERY    . COLONOSCOPY    . DILATION AND CURETTAGE OF UTERUS N/A 06/16/2016   Procedure: DILATATION AND CURETTAGE;  Surgeon: Mora Bellman, MD;  Location: [facility];  Service: Gynecology;  Laterality: N/A;  . HYSTEROSCOPY N/A 06/16/2016   Procedure: HYSTEROSCOPY;  Surgeon: Mora Bellman, MD;  Location: [facility];  Service: Gynecology;  Laterality: N/A;  . MOUTH SURGERY N/A   . POLYPECTOMY N/A 06/16/2016   Procedure: POLYPECTOMY;  Surgeon: Mora Bellman, MD;  Location: [facility];  Service: Gynecology;  Laterality: N/A;    OB History    Gravida Para Term Preterm AB Living   _0 SAB TAB Ectopic Multiple Live Births           3       Home Medications    Prior to Admission medications   Medication Sig Start Date End Date Taking? Authorizing Provider  amLODipine (NORVASC) 5 MG tablet take 1 tablet by mouth once daily 08/03/16  Yes Lucious Groves, DO  aspirin EC 81 MG tablet Take 1 tablet (81 mg total) by mouth daily. 04/09/16  Yes Lucious Groves, DO  atorvastatin (LIPITOR) 40 MG tablet Take 1 tablet (40 mg total) by mouth daily. 03/25/16 03/25/17 Yes Lucious Groves, DO  ACCU-CHEK AVIVA PLUS test strip TEST three times a day 11/25/15   Lucious Groves, DO  bisacodyl (DULCOLAX) 5 MG EC tablet Take 10 mg by mouth daily as needed for moderate constipation.    [provider]  Blood Glucose Monitoring Suppl (ACCU-CHEK AVIVA PLUS) w/Device KIT Use to check you blood sugar 3 times a day 06/04/15   Oval Linsey, MD  ciprofloxacin (CIPRO) 500 MG tablet Take 1 tablet (500 mg total) by mouth 2 (two) times daily. Patient not taking: Reported on 02/12/2017 11/26/16   Levin Erp, PA  docusate sodium (COLACE) 100 MG capsule Take 1 capsule (100 mg total) by mouth 2 (two) times daily as needed. Patient taking differently: Take 100 mg by mouth 2 (two) times daily as needed for  mild constipation.  06/16/16   Constant, Peggy, MD  hydroxypropyl methylcellulose / hypromellose (ISOPTO TEARS / GONIOVISC) 2.5 % ophthalmic solution Place 1 drop into both eyes as needed for dry eyes.    [provider]  Linaclotide (LINZESS PO) Take 1 capsule by mouth daily.    [provider]  loperamide (IMODIUM) 2 MG capsule Take 1 capsule (2 mg total) 4 (four) times daily as needed by mouth for diarrhea or loose stools. 37/6/28   Delora Fuel, MD  losartan (COZAAR) 100 MG tablet Take 1 tablet (100 mg total) by mouth daily. Patient not taking: Reported on 02/12/2017 08/12/16   Lucious Groves, DO  metFORMIN (GLUCOPHAGE-XR) 500 MG 24 hr tablet Take 1 tablet (500 mg total) by mouth daily with breakfast. Patient not taking: Reported on 02/12/2017 04/10/16   Lucious Groves, DO  metroNIDAZOLE (FLAGYL) 500 MG tablet Take 1 tablet (500 mg total) by mouth 3 (three) times daily. Patient not taking: Reported on 02/12/2017 11/26/16   Fabio Asa,  Lavone Nian, PA  Multiple Vitamin (MULTIVITAMIN WITH MINERALS) TABS tablet Take 1 tablet by mouth daily. One-a-Day Women's 50+    [provider]  Pancrelipase, Lip-Prot-Amyl, 24000-76000 units CPEP Take 4 capsules (96,000 Units total) by mouth See admin instructions. Take 4 capsules by mouth 2-3 times daily before meals Patient taking differently: Take 72,000-96,000 Units by mouth See admin instructions. Pt takes before meals depends on how much is eating 03/10/16   Joni Reining C, DO  polyethylene glycol (MIRALAX / GLYCOLAX) packet Take 17 g by mouth daily as needed for mild constipation. 10/24/15   Oval Linsey, MD  silver sulfADIAZINE (SILVADENE) 1 % cream Apply 1 application topically daily. 02/25/16   Tuchman, Richard C, DPM  sitaGLIPtin (JANUVIA) 50 MG tablet Take 1 tablet (50 mg total) by mouth daily. 09/10/16   Lucious Groves, DO    Family History Family History  Problem Relation Age of Onset  . Dementia Mother   . Colon cancer  Neg Hx     Social History Social History   Tobacco Use  . Smoking status: Current Some Day Smoker    Packs/day: 0.50    Years: 44.00    Pack years: 22.00    Types: Cigarettes  . Smokeless tobacco: Never Used  . Tobacco comment: tobacco info given 11/26/16  Substance Use Topics  . Alcohol use: Yes    Alcohol/week: 0.0 oz    Comment: occ  . Drug use: No     Allergies   Gabapentin and Nicorette [nicotine]   Review of Systems Review of Systems  Constitutional: Negative for chills, fatigue and fever.  HENT: Negative for congestion.   Eyes: Negative for visual disturbance.  Respiratory: Negative for cough and shortness of breath.   Gastrointestinal: Negative for abdominal pain, nausea and vomiting.  Genitourinary: Negative for difficulty urinating.  Musculoskeletal: Positive for arthralgias (right shoulder) and back pain. Negative for gait problem.  Skin: Negative for rash.  Neurological: Negative for dizziness, weakness, light-headedness, numbness and headaches.  Psychiatric/Behavioral: Negative for agitation.     Physical Exam Updated Vital Signs BP 136/64 (BP Location: Right Arm)   Pulse 74   Temp 98.2 F (36.8 C) (Oral)   Resp 18   LMP 02/22/2016   SpO2 100%   Physical Exam  Constitutional: She is oriented to person, place, and time. She appears well-developed and well-nourished. No distress.  HENT:  Head: Normocephalic and atraumatic.  Mouth/Throat: Oropharynx is clear and moist. No oropharyngeal exudate.  Eyes: Conjunctivae are normal. Pupils are equal, round, and reactive to light. Right eye exhibits no discharge. Left eye exhibits no discharge. No scleral icterus.  Neck: Normal range of motion. Neck supple.  No midline cervical spine tenderness.  Cardiovascular: Normal rate, regular rhythm and intact distal pulses. Exam reveals no friction rub.  Murmur heard. Blowing systolic heart murmur best heard in the LUSB  Pulmonary/Chest: Effort normal. No  stridor. No respiratory distress.  Coarse breath sounds in right upper lung field. No wheezing, no rales. No chest tenderness.   Abdominal: Soft. Bowel sounds are normal. There is no tenderness. There is no guarding.  Musculoskeletal:  Mild tenderness to palpation over the spinous processes of the thoracic spine and bilateral paraspinal muscles of the thoracic spine.  No step-off or deformity.  No overlying rash.  No break in the skin.  No lumbar spine tenderness.  No leg swelling or calf tenderness.  Lymphadenopathy:    She has no cervical adenopathy.  Neurological: She is alert  and oriented to person, place, and time. Coordination normal.  Skin: Skin is warm and dry. Capillary refill takes less than 2 seconds. She is not diaphoretic.  Psychiatric: She has a normal mood and affect. Her behavior is normal.  Nursing note and vitals reviewed.    ED Treatments / Results  Labs (all labs ordered are listed, but only abnormal results are displayed) Labs Reviewed  BASIC METABOLIC PANEL - Abnormal; Notable for the following components:      Result Value   Sodium 134 (*)    CO2 21 (*)    Glucose, Bld 270 (*)    BUN 25 (*)    Creatinine, Ser 1.23 (*)    GFR calc non Af Amer 45 (*)    GFR calc Af Amer 52 (*)    All other components within normal limits  CBC - Abnormal; Notable for the following components:   WBC 3.7 (*)    RBC 3.34 (*)    Hemoglobin 9.8 (*)    HCT 29.1 (*)    Platelets 124 (*)    All other components within normal limits  I-STAT TROPONIN, ED    EKG  EKG Interpretation  Date/Time:  Friday February 12 2017 10:43:52 EST Ventricular Rate:  81 PR Interval:  162 QRS Duration: 100 QT Interval:  410 QTC Calculation: 476 R Axis:   -40 Text Interpretation:  Normal sinus rhythm Left axis deviation Incomplete right bundle branch block Left ventricular hypertrophy with repolarization abnormality Cannot rule out Septal infarct , age undetermined Abnormal ECG No significant  change was found Confirmed by Jola Schmidt [phone]) on 02/12/2017 12:14:31 PM       Radiology Dg Chest 2 View  Result Date: 02/12/2017 CLINICAL DATA:  Chest pain in the right shoulder and neck. EXAM: CHEST  2 VIEW COMPARISON:  10/24/2016 FINDINGS: Heart size upper limits of normal. Mediastinal shadows are normal. There are abnormal interstitial markings not seen previously, with mild atelectasis at the right base. This could be due to mild edema or interstitial pneumonitis. No consolidation or collapse. No measurable effusion. Ordinary degenerative changes affect the spine. IMPRESSION: Newly seen interstitial markings which could indicate fluid overload or interstitial pneumonitis. Electronically Signed   By: Nelson Chimes M.D.   On: 02/12/2017 11:34    Procedures Procedures (including critical care time)  Medications Ordered in ED Medications - No data to display   Initial Impression / Assessment and Plan / ED Course  I have reviewed the triage vital signs and the nursing notes.  Pertinent labs & imaging results that were available during my care of the patient were reviewed by me and considered in my medical decision making (see chart for details).    CXR reveals interstitial pneumonitis. Symptoms consistent with pleurisy (pain is worse with deep inhale, particularly with cigarette.) Do not suspect PE given she is not tachycardic or tachypneic, no leg swelling, no history of DVT/PE, no exogenous estrogen an no recent surgery or immobilization.   Patient reports dull chest pain about 12 hrs ago. Denies chest pain since that time. Troponin negative and EKG non-ischemic. Do not suspect ACS given presentation and negative work up with absence of chest pain symptoms for 12hrs now.   Have counseled patient on use of albuterol for wheezing and tylenol for pain. Have also discussed following up with her PCP regarding her glucose (bg 270) and blood pressure which were both elevated today.  Discussed return precautions and patient agrees and voices understanding to the above  plan. Discussed this patient with Dr. Venora Maples who also saw the patient and agrees with plan to discharge.    Final Clinical Impressions(s) / ED Diagnoses   Final diagnoses:  Acute bilateral thoracic back pain    ED Discharge Orders    None       Bernarda Caffey 02/14/17 Evon Slack, MD 02/17/17 [phone]

## 2017-02-12 NOTE — Progress Notes (Signed)
  Subjective:  Patient ID: [name], female    DOB: 30-Mar-1950,  MRN: [id number]  Chief Complaint  Patient presents with  . Wound Check    Follow-up; Bilateral; plantar forefoot - below great toe & 5th toe: pt Diabetic Type 2; Sugar=did not check today; A1C=7.3   66 y.o. female returns for wound care. Long-standing patient of Dr. Amalia Hailey. Believes the wound to be about the same. Denies N/V/F/Ch.  Continues to smoke.  Objective:   Vitals:   02/12/17 0814  BP: 128/62  Pulse: 82  Resp: 16   General AA&O x3. Normal mood and affect.  Vascular DP pulses 1/4. PT non-palp.  Neurologic Sensation grossly diminished.  Dermatologic (Wound) Wound Location: Rt. foot 5th met head Wound Measurement: 0.3 x 0.3 x 0.1 Wound Base: Granular/Healthy Peri-wound: Macerated, Calloused Exudate: None: wound tissue dry  L 5th MPJ callus Bilat 1st MPJ pre-ulcerative callus.  Orthopedic: Pain to palpation about the calluses.   Assessment & Plan:  Patient was evaluated and treated and all questions answered.  Ulcer R 5th MPJ -Debridement as below. -Dressed with silvadene, DSD. -Disussed benefit of off-loading with DM shoes. -Patient to f/u for DM shoe appt. -Educated on smoking cessation for improved wound healing.  Procedure: Selective Debridement of Wound Rationale: Removal of devitalized tissue from the wound to promote healing.  Pre-Debridement Wound Measurements: 0.3 cm x 0.3 cm x 0.1 cm  Post-Debridement Wound Measurements: same as pre-debridement. Type of Debridement: Selective Tissue Removed: Devitalized soft-tissue Instrumentation: 3-0 mm dermal curette Dressing: Dry, sterile, compression dressing. Disposition: Patient tolerated procedure well. Patient to return in 1 week for follow-up.  Pre-ulcerative calluses -Debrided lesions as below.  Procedure: Paring of Lesion Rationale: painful hyperkeratotic lesion Type of Debridement: manual, sharp debridement. Instrumentation: 15  blade Number of Lesions: 2  Return in about 4 weeks (around 03/12/2017) for Wound Care.

## 2017-03-09 ENCOUNTER — Ambulatory Visit: Payer: Medicare Other

## 2017-03-12 ENCOUNTER — Encounter: Payer: Self-pay | Admitting: Podiatry

## 2017-03-12 ENCOUNTER — Ambulatory Visit (INDEPENDENT_AMBULATORY_CARE_PROVIDER_SITE_OTHER): Payer: Medicare Other | Admitting: Podiatry

## 2017-03-12 DIAGNOSIS — L97411 Non-pressure chronic ulcer of right heel and midfoot limited to breakdown of skin: Secondary | ICD-10-CM | POA: Diagnosis not present

## 2017-03-12 DIAGNOSIS — Q828 Other specified congenital malformations of skin: Secondary | ICD-10-CM

## 2017-03-12 DIAGNOSIS — E1142 Type 2 diabetes mellitus with diabetic polyneuropathy: Secondary | ICD-10-CM | POA: Diagnosis not present

## 2017-03-12 DIAGNOSIS — L84 Corns and callosities: Secondary | ICD-10-CM

## 2017-03-12 DIAGNOSIS — E11621 Type 2 diabetes mellitus with foot ulcer: Secondary | ICD-10-CM | POA: Diagnosis not present

## 2017-03-12 DIAGNOSIS — L97511 Non-pressure chronic ulcer of other part of right foot limited to breakdown of skin: Secondary | ICD-10-CM

## 2017-03-12 DIAGNOSIS — L858 Other specified epidermal thickening: Secondary | ICD-10-CM

## 2017-03-13 ENCOUNTER — Other Ambulatory Visit: Payer: Self-pay | Admitting: Nephrology

## 2017-03-13 DIAGNOSIS — R748 Abnormal levels of other serum enzymes: Secondary | ICD-10-CM

## 2017-03-14 NOTE — Progress Notes (Signed)
  Subjective:  Patient ID: [name], female    DOB: Jan 23, 1951,  MRN: [id number]  Chief Complaint  Patient presents with  . Diabetic Ulcer    4 week follow up diabetic ulcer right foot - sub 5th MPJ right  "They not any better'   67 y.o. female returns for wound care. States the wound is not any better. Denies N/V/F/Ch.  Objective:  There were no vitals filed for this visit. General AA&O x3. Normal mood and affect.  Vascular Foot warm to touch.  Neurologic Sensation grossly diminished.  Dermatologic (Wound) Wound Location: R 5th met head Wound Measurement: 0.3 x 0.3 x 0.1 Wound Base: Granular/Healthy Peri-wound: Calloused Exudate: None: wound tissue dry  Wound progress: No Change since last check.  Keratotic lesions left fifth MPJ, bilateral first MPJ  Orthopedic: No pain to palpation either foot.   Assessment & Plan:  Patient was evaluated and treated and all questions answered.  Ulcer R 5th MPJ -Debridement as below. -Dressed with silvadene, DSD. -Will adjust patient's diabetic inserts at next visit.   Procedure: Excisional Debridement of Wound Rationale: Removal of non-viable soft tissue from the wound to promote healing.  Anesthesia: none Pre-Debridement Wound Measurements: 0.3 cm x 0.3 cm x 0.1 cm  Post-Debridement Wound Measurements: same  Type of Debridement: Excisional Tissue Removed: Non-viable soft tissue Depth of Debridement: subq Instrumentation: 312 blade. Technique: Sharp excisional debridement to bleeding, viable wound base.  Dressing: Dry, sterile, compression dressing. Disposition: Patient tolerated procedure well. Patient to return in 1 week for follow-up.  Pre-ulcerative calluses -Debrided x3  Procedure: Paring of Lesion Rationale: painful hyperkeratotic lesion Type of Debridement: manual, sharp debridement. Instrumentation: 312 blade Number of Lesions: 3  No Follow-up on file.

## 2017-03-18 ENCOUNTER — Telehealth: Payer: Self-pay

## 2017-03-18 NOTE — Telephone Encounter (Signed)
After multiple attempts have been unable to reach pt by phone. Letter mailed to pt requesting she call and schedule CT scan appt.

## 2017-03-18 NOTE — Telephone Encounter (Signed)
-----   Message from Algernon Huxley, RN sent at 02/26/2017  4:04 PM EST ----- Regarding: FW: CT ABD   ----- Message ----- From: Algernon Huxley, RN Sent: 02/01/2017 To: Algernon Huxley, RN Subject: FW: CT ABD                                       ----- Message ----- From: Irene Shipper, MD Sent: 12/25/2016   4:03 PM To: Algernon Huxley, RN Subject: RE: CT ABD                                     Yes, push her repeat CT out to December. Thanks ----- Message ----- From: Algernon Huxley, RN Sent: 12/25/2016   9:10 AM To: Irene Shipper, MD Subject: FW: CT ABD                                     Pt had CT of A/P 09/23/16 and was due for a 3 mth follow-up CT. Pt had another CT done 10/24/16. Do you want to push her repeat CT out to December?  ----- Message ----- From: Algernon Huxley, RN Sent: 12/24/2016 To: Algernon Huxley, RN Subject: CT ABD                                         Pt needs repeat CT of ABD in 3 mth-check out liver lesions

## 2017-03-24 ENCOUNTER — Other Ambulatory Visit: Payer: Self-pay

## 2017-03-24 ENCOUNTER — Emergency Department (HOSPITAL_COMMUNITY)
Admission: EM | Admit: 2017-03-24 | Discharge: 2017-03-24 | Disposition: A | Discharge status: Home / Self Care | Payer: Medicare Other | Attending: Emergency Medicine | Admitting: Emergency Medicine

## 2017-03-24 ENCOUNTER — Encounter (HOSPITAL_COMMUNITY): Payer: Self-pay | Admitting: Emergency Medicine

## 2017-03-24 ENCOUNTER — Ambulatory Visit
Admission: RE | Admit: 2017-03-24 | Discharge: 2017-03-24 | Disposition: A | Discharge status: Home / Self Care | Payer: Medicare Other | Source: Ambulatory Visit | Attending: Nephrology | Admitting: Nephrology

## 2017-03-24 ENCOUNTER — Emergency Department (HOSPITAL_COMMUNITY): Payer: Medicare Other

## 2017-03-24 DIAGNOSIS — Z7984 Long term (current) use of oral hypoglycemic drugs: Secondary | ICD-10-CM | POA: Diagnosis not present

## 2017-03-24 DIAGNOSIS — Z87891 Personal history of nicotine dependence: Secondary | ICD-10-CM | POA: Diagnosis not present

## 2017-03-24 DIAGNOSIS — Z7982 Long term (current) use of aspirin: Secondary | ICD-10-CM | POA: Diagnosis not present

## 2017-03-24 DIAGNOSIS — R079 Chest pain, unspecified: Secondary | ICD-10-CM | POA: Diagnosis not present

## 2017-03-24 DIAGNOSIS — N183 Chronic kidney disease, stage 3 (moderate): Secondary | ICD-10-CM | POA: Diagnosis not present

## 2017-03-24 DIAGNOSIS — R748 Abnormal levels of other serum enzymes: Secondary | ICD-10-CM

## 2017-03-24 DIAGNOSIS — I129 Hypertensive chronic kidney disease with stage 1 through stage 4 chronic kidney disease, or unspecified chronic kidney disease: Secondary | ICD-10-CM | POA: Diagnosis not present

## 2017-03-24 DIAGNOSIS — E1122 Type 2 diabetes mellitus with diabetic chronic kidney disease: Secondary | ICD-10-CM | POA: Insufficient documentation

## 2017-03-24 LAB — BASIC METABOLIC PANEL
Anion gap: 9 (ref 5–15)
BUN: 43 mg/dL — ABNORMAL HIGH (ref 6–20)
CHLORIDE: 106 mmol/L (ref 101–111)
CO2: 20 mmol/L — AB (ref 22–32)
Calcium: 9.1 mg/dL (ref 8.9–10.3)
Creatinine, Ser: 1.29 mg/dL — ABNORMAL HIGH (ref 0.44–1.00)
GFR calc non Af Amer: 42 mL/min — ABNORMAL LOW (ref >60)
GFR, EST AFRICAN AMERICAN: 49 mL/min — AB (ref >60)
GLUCOSE: 229 mg/dL — AB (ref 65–99)
Potassium: 3.8 mmol/L (ref 3.5–5.1)
Sodium: 135 mmol/L (ref 135–145)

## 2017-03-24 LAB — CBC
HCT: 29.9 % — ABNORMAL LOW (ref 36.0–46.0)
HEMOGLOBIN: 10.1 g/dL — AB (ref 12.0–15.0)
MCH: 29.1 pg (ref 26.0–34.0)
MCHC: 33.8 g/dL (ref 30.0–36.0)
MCV: 86.2 fL (ref 78.0–100.0)
PLATELETS: 114 10*3/uL — AB (ref 150–400)
RBC: 3.47 MIL/uL — AB (ref 3.87–5.11)
RDW: 14.8 % (ref 11.5–15.5)
WBC: 3.3 10*3/uL — ABNORMAL LOW (ref 4.0–10.5)

## 2017-03-24 LAB — I-STAT TROPONIN, ED: Troponin i, poc: 0.01 ng/mL (ref 0.00–0.08)

## 2017-03-24 LAB — CBG MONITORING, ED: GLUCOSE-CAPILLARY: 147 mg/dL — AB (ref 65–99)

## 2017-03-24 LAB — D-DIMER, QUANTITATIVE: D-Dimer, Quant: 1.06 ug/mL-FEU — ABNORMAL HIGH (ref 0.00–0.50)

## 2017-03-24 MED ORDER — BENZONATATE 100 MG PO CAPS: 100.0000 mg | ORAL_CAPSULE | Freq: Three times a day (TID) | ORAL | 0 refills | Status: DC

## 2017-03-24 MED ORDER — IOPAMIDOL (ISOVUE-370) INJECTION 76%
INTRAVENOUS | Status: AC
  Administered 2017-03-24: 100 mL
  Filled 2017-03-24: qty 100

## 2017-03-24 NOTE — Discharge Instructions (Signed)
Take the prescribed medication as directed.  Recommend tylenol for pain. Follow-up with your primary care doctor. Return to the ED for new or worsening symptoms.

## 2017-03-24 NOTE — ED Triage Notes (Signed)
Pt reports central CP onset yesterday with radiation into back. States sharp, 7/10. Pt also reports dry cough. States hx of pneumonia and this feels the same.

## 2017-03-24 NOTE — ED Notes (Signed)
Patient transported to X-ray 

## 2017-03-24 NOTE — ED Notes (Signed)
ED Provider at bedside. 

## 2017-03-24 NOTE — ED Notes (Signed)
Patient left at this time with all belongings. 

## 2017-03-24 NOTE — ED Provider Notes (Signed)
[facility]ER[facility]ENC[facility] DEPARTMENT Provider Note   CSN: [id number] Arrival date & time: 03/24/17  0005     History   Chief Complaint Chief Complaint  Patient presents with  . Chest Pain  . Back Pain    HPI [name] is a 67 y.o. female.  The history is provided by the patient and medical records.  Chest Pain   Associated symptoms include back pain.  Back Pain   Associated symptoms include chest pain.    67 year old female with history of anemia, degenerative joint disease, diabetic neuropathy, diastolic dysfunction, hyperlipidemia, hypertension, aortic regurgitation, peripheral arterial disease, chronic renal insufficiency, presenting to the ED with chest and right-sided back pain.  Reports this initially started last week but seemed to resolve but started back again yesterday.  States pain is worse with movement and deep breathing, sharp and stabbing in nature in her right upper back.  She denies any shortness of breath.  Has had a dry cough.  No hemoptysis, fever, or chills.  States when she has had these types of symptoms in the past she has had pneumonia.  She did just finish a course of antibiotics prescribed by her PCP yesterday for these same symptoms but has not had any improvement.  States she has had this issue a few times in the past and no one has been able to give her a concrete diagnosis.  States she feels like there is "just something wrong".  Past Medical History:  Diagnosis Date  . ANEMIA, CHRONIC DISEASE NEC 03/15/2006  . Colon polyps   . Constipation due to opioid therapy 06/05/2014  . DEGENERATIVE JOINT DISEASE 05/06/2007   On going for >5 yrs No imaging to confirm Has tried ultram, mobic, neurontin which did not work Ambulance person tried once worked well    . Diabetes mellitus   . DIABETIC  RETINOPATHY 03/15/2006  . 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 Diastolic dysfunction 37/62/8315   Grade 1 by Echocardiogram 12/13/14  . Heart murmur     . Hemorrhoids   . History of kidney stones   . Hyperlipidemia 04/01/2011  . Hypertension   . Moderate aortic regurgitation 09/25/2008   12/13/14 Echocardiogram    . PANCREATITIS, CHRONIC 03/29/2006  . Peripheral arterial disease ([place])   . Pneumonia    age 16-20  . RENAL INSUFFICIENCY, CHRONIC 03/15/2006    Patient Active Problem List   Diagnosis Date Noted  . Tubular adenoma of colon 06/30/2016  . Diverticulosis of colon without hemorrhage 06/30/2016  . Postmenopausal vaginal bleeding   . Long term (current) use of opiate analgesic 04/09/2016  . Bilateral carotid artery disease ([place]) 11/28/2015  . Pancreatic insufficiency 05/09/2015  . 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 Risk for coronary artery disease greater than 20% in next 10 years 03/14/2015  . Diastolic dysfunction 17/61/6073  . Vulvar cyst 01/23/2015  . Abnormal uterine bleeding 12/02/2014  . Unexplained night sweats 12/02/2014  . Constipation due to opioid therapy 06/05/2014  . Loss of weight 03/29/2014  . Peripheral arterial disease ([place]) 12/12/2013  . GERD (gastroesophageal reflux disease) 10/12/2012  . Healthcare maintenance 09/29/2012  . Back pain 03/28/2012  . Hyperlipidemia 04/01/2011  . BOILS, RECURRENT 03/13/2010  . Moderate aortic regurgitation 09/25/2008  . HEMORRHOIDS 06/08/2008  . Osteoarthritis 05/06/2007  . TOBACCO ABUSE 09/27/2006  . History of osteoporosis 06/29/2006  . Chronic pancreatitis ([place]) 03/29/2006  . Uncontrolled type 2 diabetes mellitus with chronic kidney disease, without long-term current use of  insulin ([place]) 03/15/2006  . Diabetic neuropathy, painful ([place]) 03/15/2006  . Anemia of chronic disease 03/15/2006  . Essential hypertension 03/15/2006  . VENTRICULAR HYPERTROPHY, LEFT 03/15/2006  . CKD stage 3 due to type 2 diabetes mellitus ([place]) 03/15/2006    Past Surgical History:  Procedure Laterality Date  . BREAST SURGERY    . COLONOSCOPY    . DILATION AND CURETTAGE OF  UTERUS N/A 06/16/2016   Procedure: DILATATION AND CURETTAGE;  Surgeon: Mora Bellman, MD;  Location: [facility];  Service: Gynecology;  Laterality: N/A;  . HYSTEROSCOPY N/A 06/16/2016   Procedure: HYSTEROSCOPY;  Surgeon: Mora Bellman, MD;  Location: [facility];  Service: Gynecology;  Laterality: N/A;  . MOUTH SURGERY N/A   . POLYPECTOMY N/A 06/16/2016   Procedure: POLYPECTOMY;  Surgeon: Mora Bellman, MD;  Location: [facility];  Service: Gynecology;  Laterality: N/A;    OB History    Gravida Para Term Preterm AB Living   _0 SAB TAB Ectopic Multiple Live Births           3       Home Medications    Prior to Admission medications   Medication Sig Start Date End Date Taking? Authorizing Provider  ACCU-CHEK AVIVA PLUS test strip TEST three times a day 11/25/15   Lucious Groves, DO  amLODipine (NORVASC) 5 MG tablet take 1 tablet by mouth once daily 08/03/16   Lucious Groves, DO  aspirin EC 81 MG tablet Take 1 tablet (81 mg total) by mouth daily. 04/09/16   Lucious Groves, DO  atorvastatin (LIPITOR) 40 MG tablet Take 1 tablet (40 mg total) by mouth daily. 03/25/16 03/25/17  Lucious Groves, DO  bisacodyl (DULCOLAX) 5 MG EC tablet Take 10 mg by mouth daily as needed for moderate constipation.    [provider]  Blood Glucose Monitoring Suppl (ACCU-CHEK AVIVA PLUS) w/Device KIT Use to check you blood sugar 3 times a day 06/04/15   Oval Linsey, MD  ciprofloxacin (CIPRO) 500 MG tablet Take 1 tablet (500 mg total) by mouth 2 (two) times daily. Patient not taking: Reported on 02/12/2017 11/26/16   Levin Erp, PA  docusate sodium (COLACE) 100 MG capsule Take 1 capsule (100 mg total) by mouth 2 (two) times daily as needed. Patient taking differently: Take 100 mg by mouth 2 (two) times daily as needed for mild constipation.  06/16/16   Constant, Peggy, MD  hydroxypropyl methylcellulose / hypromellose (ISOPTO TEARS / GONIOVISC) 2.5 % ophthalmic solution Place 1 drop into both  eyes as needed for dry eyes.    [provider]  Linaclotide (LINZESS PO) Take 1 capsule by mouth daily.    [provider]  loperamide (IMODIUM) 2 MG capsule Take 1 capsule (2 mg total) 4 (four) times daily as needed by mouth for diarrhea or loose stools. 43/7/35   Delora Fuel, MD  losartan (COZAAR) 100 MG tablet Take 1 tablet (100 mg total) by mouth daily. Patient not taking: Reported on 02/12/2017 08/12/16   Lucious Groves, DO  metFORMIN (GLUCOPHAGE-XR) 500 MG 24 hr tablet Take 1 tablet (500 mg total) by mouth daily with breakfast. Patient not taking: Reported on 02/12/2017 04/10/16   Lucious Groves, DO  metroNIDAZOLE (FLAGYL) 500 MG tablet Take 1 tablet (500 mg total) by mouth 3 (three) times daily. Patient not taking: Reported on 02/12/2017 11/26/16   Levin Erp, PA  Multiple Vitamin (MULTIVITAMIN WITH MINERALS) TABS  tablet Take 1 tablet by mouth daily. One-a-Day Women's 50+    [provider]  Pancrelipase, Lip-Prot-Amyl, 24000-76000 units CPEP Take 4 capsules (96,000 Units total) by mouth See admin instructions. Take 4 capsules by mouth 2-3 times daily before meals Patient taking differently: Take 72,000-96,000 Units by mouth See admin instructions. Pt takes before meals depends on how much is eating 03/10/16   Joni Reining C, DO  polyethylene glycol (MIRALAX / GLYCOLAX) packet Take 17 g by mouth daily as needed for mild constipation. 10/24/15   Oval Linsey, MD  silver sulfADIAZINE (SILVADENE) 1 % cream Apply 1 application topically daily. 02/25/16   Tuchman, Richard C, DPM  sitaGLIPtin (JANUVIA) 50 MG tablet Take 1 tablet (50 mg total) by mouth daily. 09/10/16   Lucious Groves, DO    Family History Family History  Problem Relation Age of Onset  . Dementia Mother   . Colon cancer Neg Hx     Social History Social History   Tobacco Use  . Smoking status: Former Smoker    Packs/day: 0.50    Years: 44.00    Pack years: 22.00    Types:  Cigarettes    Last attempt to quit: 01/2017    Years since quitting: 0.1  . Smokeless tobacco: Never Used  . Tobacco comment: tobacco info given 11/26/16  Substance Use Topics  . Alcohol use: Yes    Alcohol/week: 0.0 oz    Comment: occ  . Drug use: No     Allergies   Gabapentin and Nicorette [nicotine]   Review of Systems Review of Systems  Cardiovascular: Positive for chest pain.  Musculoskeletal: Positive for back pain.  All other systems reviewed and are negative.    Physical Exam Updated Vital Signs BP (!) 145/75 (BP Location: Right Arm)   Pulse 98   Temp 97.9 F (36.6 C) (Oral)   Resp 18   Ht '5\' 5"'$  (1.651 m)   Wt 56.7 kg (125 lb)   LMP 02/22/2016   SpO2 99%   BMI 20.80 kg/m   Physical Exam  Constitutional: She is oriented to person, place, and time. She appears well-developed and well-nourished.  HENT:  Head: Normocephalic and atraumatic.  Mouth/Throat: Oropharynx is clear and moist.  Eyes: Conjunctivae and EOM are normal. Pupils are equal, round, and reactive to light.  Neck: Normal range of motion.  Cardiovascular: Normal rate, regular rhythm and normal heart sounds.  Pulmonary/Chest: Effort normal and breath sounds normal. She has no decreased breath sounds. She has no wheezes. She has no rales.  Abdominal: Soft. Bowel sounds are normal.  Musculoskeletal: Normal range of motion.  Neurological: She is alert and oriented to person, place, and time.  Skin: Skin is warm and dry.  Psychiatric: She has a normal mood and affect.  Nursing note and vitals reviewed.    ED Treatments / Results  Labs (all labs ordered are listed, but only abnormal results are displayed) Labs Reviewed  BASIC METABOLIC PANEL - Abnormal; Notable for the following components:      Result Value   CO2 20 (*)    Glucose, Bld 229 (*)    BUN 43 (*)    Creatinine, Ser 1.29 (*)    GFR calc non Af Amer 42 (*)    GFR calc Af Amer 49 (*)    All other components within normal limits    CBC - Abnormal; Notable for the following components:   WBC 3.3 (*)    RBC 3.47 (*)  Hemoglobin 10.1 (*)    HCT 29.9 (*)    Platelets 114 (*)    All other components within normal limits  D-DIMER, QUANTITATIVE (NOT AT [facility]) - Abnormal; Notable for the following components:   D-Dimer, Quant 1.06 (*)    All other components within normal limits  I-STAT TROPONIN, ED    EKG  EKG Interpretation  Date/Time:  Wednesday March 24 2017 00:10:18 EST Ventricular Rate:  95 PR Interval:  158 QRS Duration: 110 QT Interval:  384 QTC Calculation: 482 R Axis:   -8 Text Interpretation:  Normal sinus rhythm Left ventricular hypertrophy with repolarization abnormality Cannot rule out Septal infarct , age undetermined Abnormal ECG No significant change since last tracing Confirmed by Ripley Fraise [phone]) on 03/24/2017 1:35:30 AM       Radiology Dg Chest 2 View  Result Date: 03/24/2017 CLINICAL DATA:  Chest pain onset yesterday with radiation to back. Dry cough. EXAM: CHEST  2 VIEW COMPARISON:  Chest x-rays dated 02/12/2017 and 11/06/2014. FINDINGS: Heart size and mediastinal contours are stable. Lungs are clear. No pleural effusion or pneumothorax seen. No acute or suspicious osseous finding. IMPRESSION: No active cardiopulmonary disease. No evidence of pneumonia or pulmonary edema. Electronically Signed   By: Franki Cabot M.D.   On: 03/24/2017 00:47   Ct Angio Chest Pe W And/or Wo Contrast  Result Date: 03/24/2017 CLINICAL DATA:  Positive D-dimer and central chest pain radiating to the back. Cough. History of pneumonia, hypertension, diabetes, breast surgery. Positive D-dimer. EXAM: CT ANGIOGRAPHY CHEST WITH CONTRAST TECHNIQUE: Multidetector CT imaging of the chest was performed using the standard protocol during bolus administration of intravenous contrast. Multiplanar CT image reconstructions and MIPs were obtained to evaluate the vascular anatomy. CONTRAST:  127m ISOVUE-370 IOPAMIDOL  (ISOVUE-370) INJECTION 76% COMPARISON:  06/03/2012 FINDINGS: Cardiovascular: Good opacification of the central and segmental pulmonary arteries. No focal filling defects. No evidence of significant pulmonary embolus. The main pulmonary artery is mildly dilated, possibly indicating arterial hypertension. Small amount of gas in the pulmonary arteries likely result from intravenous injections. Normal heart size. No pericardial effusions. Coronary artery and aortic calcifications. Normal caliber aorta. Mediastinum/Nodes: No enlarged mediastinal, hilar, or axillary lymph nodes. Thyroid gland, trachea, and esophagus demonstrate no significant findings. Lungs/Pleura: Atelectasis in the lung bases. Mild emphysematous changes in the apices. Mosaic attenuation pattern to the lower lungs is nonspecific in could be due to motion artifact but it could also indicate edema or air trapping. Upper Abdomen: Calcifications in the pancreas suggesting chronic pancreatitis. Musculoskeletal: No chest wall abnormality. No acute or significant osseous findings. Review of the MIP images confirms the above findings. IMPRESSION: 1. No evidence of significant pulmonary embolus. 2. Emphysematous changes in the lungs. Atelectasis in the lung bases. Mosaic attenuation pattern to the lower lungs could be due to motion artifact, air trapping, or edema. 3. Aortic atherosclerosis.  Coronary artery calcifications. 4. Calcification in the pancreas suggesting chronic pancreatitis. Aortic Atherosclerosis (ICD10-I70.0) and Emphysema (ICD10-J43.9). Electronically Signed   By: WLucienne CapersM.D.   On: 03/24/2017 04:05    Procedures Procedures (including critical care time)  Medications Ordered in ED Medications - No data to display   Initial Impression / Assessment and Plan / ED Course  I have reviewed the triage vital signs and the nursing notes.  Pertinent labs & imaging results that were available during my care of the patient were  reviewed by me and considered in my medical decision making (see chart for details).  67year old female  here with right chest and upper back pain.  On chart review, it seems she has been seen here a few times in the past for same.  States she just feels like something is "not right".  Just finished abx from her PCP for ongoing cough.  EKG is nonischemic.  Labs overall reassuring.  CXR clear.  During exam, patient began complaining of pain with breathing which she states was new.  D-dimer was sent and is elevated at 1.06.  CTA obtained, no acute findings--specifically no PE or pneumonia.  Suspect patient may have some pleurisy from her ongoing coughing.  Patient also has history of chronic back pain which may be contributing.  Recommended close follow-up with PCP.  She has mildly elevated creatinine here as well as no diabetes so we will try to avoid NSAIDs and steroids.  Recommended Tylenol for pain, Tessalon for cough.  Discussed plan with patient, she acknowledged understanding and agreed with plan of care.  Return precautions given for new or worsening symptoms.  Final Clinical Impressions(s) / ED Diagnoses   Final diagnoses:  Chest pain, unspecified type    ED Discharge Orders        Ordered    benzonatate (TESSALON) 100 MG capsule  Every 8 hours     03/24/17 0501       Larene Pickett, PA-C 03/24/17 9102    Ripley Fraise, MD 03/24/17 [phone]

## 2017-03-24 NOTE — ED Notes (Signed)
Pt back in waiting room 

## 2017-04-01 ENCOUNTER — Encounter: Payer: Self-pay | Admitting: Podiatry

## 2017-04-01 ENCOUNTER — Ambulatory Visit (INDEPENDENT_AMBULATORY_CARE_PROVIDER_SITE_OTHER): Payer: Medicare Other | Admitting: Podiatry

## 2017-04-01 DIAGNOSIS — L97511 Non-pressure chronic ulcer of other part of right foot limited to breakdown of skin: Secondary | ICD-10-CM | POA: Diagnosis not present

## 2017-04-06 ENCOUNTER — Other Ambulatory Visit: Payer: Medicare Other

## 2017-04-07 ENCOUNTER — Ambulatory Visit: Payer: Medicare Other | Admitting: Podiatry

## 2017-04-15 ENCOUNTER — Ambulatory Visit (INDEPENDENT_AMBULATORY_CARE_PROVIDER_SITE_OTHER): Payer: Medicare Other | Admitting: Internal Medicine

## 2017-04-15 ENCOUNTER — Encounter: Payer: Self-pay | Admitting: Internal Medicine

## 2017-04-15 ENCOUNTER — Other Ambulatory Visit (INDEPENDENT_AMBULATORY_CARE_PROVIDER_SITE_OTHER): Payer: Medicare Other

## 2017-04-15 ENCOUNTER — Encounter (INDEPENDENT_AMBULATORY_CARE_PROVIDER_SITE_OTHER): Payer: Self-pay

## 2017-04-15 VITALS — BP 142/64 | HR 72 | Ht 65.0 in | Wt 130.4 lb

## 2017-04-15 DIAGNOSIS — R7989 Other specified abnormal findings of blood chemistry: Secondary | ICD-10-CM

## 2017-04-15 DIAGNOSIS — R945 Abnormal results of liver function studies: Secondary | ICD-10-CM

## 2017-04-15 DIAGNOSIS — R932 Abnormal findings on diagnostic imaging of liver and biliary tract: Secondary | ICD-10-CM

## 2017-04-15 LAB — BASIC METABOLIC PANEL
BUN: 33 mg/dL — AB (ref 6–23)
CALCIUM: 9.3 mg/dL (ref 8.4–10.5)
CO2: 27 mEq/L (ref 19–32)
Chloride: 103 mEq/L (ref 96–112)
Creatinine, Ser: 1.18 mg/dL (ref 0.40–1.20)
GFR: 59.08 mL/min — ABNORMAL LOW (ref >60.00)
Glucose, Bld: 328 mg/dL — ABNORMAL HIGH (ref 70–99)
POTASSIUM: 4.5 meq/L (ref 3.5–5.1)
SODIUM: 133 meq/L — AB (ref 135–145)

## 2017-04-15 LAB — IBC PANEL
Iron: 71 ug/dL (ref 42–145)
Saturation Ratios: 21.1 % (ref 20.0–50.0)
Transferrin: 240 mg/dL (ref 212.0–360.0)

## 2017-04-15 LAB — FERRITIN: Ferritin: 117.3 ng/mL (ref 10.0–291.0)

## 2017-04-15 LAB — PROTIME-INR
INR: 1 ratio (ref 0.8–1.0)
Prothrombin Time: 11 s (ref 9.6–13.1)

## 2017-04-15 LAB — CARDIAC PANEL
CK-MB: 3.4 ng/mL (ref 0.3–4.0)
Relative Index: 2.1 calc (ref 0.0–2.5)
Total CK: 162 U/L (ref 7–177)

## 2017-04-15 LAB — IRON: Iron: 71 ug/dL (ref 42–145)

## 2017-04-15 NOTE — Progress Notes (Signed)
HISTORY OF PRESENT ILLNESS:  [name] is a 67 y.o. female with multiple medical problems who has been seen in this office for her myriad of issues including running pancreatitis with pancreatic insufficiency, history of alcohol abuse with abnormal liver tests, weight loss, constipation, and surveillance colonoscopy. I last saw the patient sent over 2018 for chronic constipation amongst other things. She is up-to-date on her surveillance colonoscopies. Sent today by her primary care provider regarding elevated liver tests. Patient has had fluctuating mild transaminase abnormalities dating back to at least 2012. Typically in a pattern consistent with alcohol and felt secondary to alcohol. More recently increased transaminase elevation with AST 100, ALT 138. Normal alkaline phosphatase and bilirubin. Pancytopenia with a blood cell count 3.3, hemoglobin 10.1, platelets 114,000. Patient tells me that she has not had alcohol a few months. She tells me that she is taking Lipitor 40 mg twice daily (as opposed to once daily on her medication list) as of the beginning of February. CT scan last August raised question of small indeterminate lesions of the liver fourth follow-up was recommended. CT of the following month did not demonstrate abnormalities. Which I get her to schedule her follow-up CT but could not reach her. GI review of systems reveals intermittent abdominal discomfort associated with constipation. Otherwise negative. She probably has been having nosebleeds for which her nephrologist recommended an ENT physician. The patient wonders if we can help her with this appointment.  REVIEW OF SYSTEMS:  All non-GI ROS negative unless otherwise stated in the history of present illness except for arthritis, back pain, visual change, headaches, heart murmur, muscle cramps, night sweats, nosebleeds, sleeping problems, sore throat, ankle swelling  Past Medical History:  Diagnosis Date  . ANEMIA, CHRONIC DISEASE  NEC 03/15/2006  . Colon polyps   . Constipation due to opioid therapy 06/05/2014  . DEGENERATIVE JOINT DISEASE 05/06/2007   On going for >5 yrs No imaging to confirm Has tried ultram, mobic, neurontin which did not work Ambulance person tried once worked well    . Diabetes mellitus   . DIABETIC  RETINOPATHY 03/15/2006  . 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 Diastolic dysfunction 48/54/6270   Grade 1 by Echocardiogram 12/13/14  . Heart murmur   . Hemorrhoids   . History of kidney stones   . Hyperlipidemia 04/01/2011  . Hypertension   . Moderate aortic regurgitation 09/25/2008   12/13/14 Echocardiogram    . PANCREATITIS, CHRONIC 03/29/2006  . Peripheral arterial disease ([place])   . Pneumonia    age 76-21  . RENAL INSUFFICIENCY, CHRONIC 03/15/2006    Past Surgical History:  Procedure Laterality Date  . BREAST SURGERY    . COLONOSCOPY    . DILATION AND CURETTAGE OF UTERUS N/A 06/16/2016   Procedure: DILATATION AND CURETTAGE;  Surgeon: Mora Bellman, MD;  Location: [facility];  Service: Gynecology;  Laterality: N/A;  . HYSTEROSCOPY N/A 06/16/2016   Procedure: HYSTEROSCOPY;  Surgeon: Mora Bellman, MD;  Location: [facility];  Service: Gynecology;  Laterality: N/A;  . MOUTH SURGERY N/A   . POLYPECTOMY N/A 06/16/2016   Procedure: POLYPECTOMY;  Surgeon: Mora Bellman, MD;  Location: [facility];  Service: Gynecology;  Laterality: N/A;    Social History [name]  reports that she quit smoking about 2 months ago. Her smoking use included cigarettes. She has a 22.00 pack-year smoking history. she has never used smokeless tobacco. She reports that she drinks alcohol. She reports that she does not use drugs.  family history includes Dementia  in her mother.  Allergies  Allergen Reactions  . Gabapentin Swelling    Legs and feet  . Nicorette [Nicotine] Nausea Only       PHYSICAL EXAMINATION: Vital signs: BP (!) 142/64   Pulse 72   Ht 5' 5" (1.651 m)   Wt 130 lb 6 oz (59.1 kg)   LMP 02/22/2016   BMI  21.70 kg/m   Constitutional: generally well-appearing, no acute distress Psychiatric: alert and oriented x3, cooperative Eyes: extraocular movements intact, anicteric, conjunctiva pink Mouth: oral pharynx moist, no lesions Neck: supple no lymphadenopathy Cardiovascular: heart regular rate and rhythm, no murmur Lungs: clear to auscultation bilaterally Abdomen: soft, protuberant, nontender, nondistended, no obvious ascites, no peritoneal signs, normal bowel sounds, no organomegaly Rectal: Omitted Extremities: no clubbing, cyanosis,. Trace lower extremity edema bilaterally Skin: no lesions on visible extremities Neuro: No focal deficits. No asterixis.    ASSESSMENT:  #1. Elevated hepatic transaminases. Most likely alcohol. Rule out drugs such as Lipitor. Rule out other causes of chronic LFT abnormalities #2. Indeterminate lesions on liver August 2018. Needs follow-up #3. Multiple medical problems #4. Multiple GI diagnoses as outlined in previous visits   PLAN:  #1. Obtain viral and nonviral studies, serologies, etc. to evaluate transaminase abnormalities #2. Stop all alcohol #3. CPK and aldolase to rule out muscle as a source #4. Schedule CT of the liver follow-up liver lesions #5. Patient told confirm with PCP the proper dose of her Lipitor #6. GI office follow-up one month  25 minutes spent face-to-face with the patient. Greater than 50% a time use for counseling regarding her liver test abnormalities and reviewing the plan as outlined

## 2017-04-15 NOTE — Patient Instructions (Addendum)
Your physician has requested that you go to the basement for lab work before leaving today  You have been scheduled for a CT scan of the abdomen and pelvis at [facility] ([address]---this is in the same building as [facility]).   You are scheduled on 04/26/2017 at 1:30pm. You should arrive 15 minutes prior to your appointment time for registration. Please follow the written instructions below on the day of your exam:  WARNING: IF YOU ARE ALLERGIC TO IODINE/X-RAY DYE, PLEASE NOTIFY RADIOLOGY IMMEDIATELY AT [phone]! YOU WILL BE GIVEN A 13 HOUR PREMEDICATION PREP.  1) Do not eat anything after 9:30am (4 hours prior to your test) 2) You have been given 2 bottles of oral contrast to drink. The solution may taste               better if refrigerated, but do NOT add ice or any other liquid to this solution. Shake  well before drinking.    Drink 1 bottle of contrast @ 11:30am (2 hours prior to your exam)  Drink 1 bottle of contrast @  12:30pm (1 hour prior to your exam)  You may take any medications as prescribed with a small amount of water except for the following: Metformin, Glucophage, Glucovance, Avandamet, Riomet, Fortamet, Actoplus Met, Janumet, Glumetza or Metaglip. The above medications must be held the day of the exam AND 48 hours after the exam.  The purpose of you drinking the oral contrast is to aid in the visualization of your intestinal tract. The contrast solution may cause some diarrhea. Before your exam is started, you will be given a small amount of fluid to drink. Depending on your individual set of symptoms, you may also receive an intravenous injection of x-ray contrast/dye. Plan on being at [facility] for 30 minutes or long, depending on the type of exam you are having performed.  If you have any questions regarding your exam or if you need to reschedule, you may call the CT department at [phone] between the hours of 8:00 am and 5:00 pm,  Monday-Friday.   Please follow up with Dr. Perry on 05/27/2017 at 2:15pm ________________________________________________________________________      

## 2017-04-18 LAB — ANA: ANA: NEGATIVE

## 2017-04-18 LAB — ALDOLASE: Aldolase: 10.2 U/L — ABNORMAL HIGH (ref <=8.1)

## 2017-04-18 LAB — ALPHA-1-ANTITRYPSIN: A1 ANTITRYPSIN SER: 149 mg/dL (ref 83–199)

## 2017-04-18 LAB — CERULOPLASMIN: CERULOPLASMIN: 29 mg/dL (ref 18–53)

## 2017-04-18 LAB — ANTI-SMOOTH MUSCLE ANTIBODY, IGG: Actin (Smooth Muscle) Antibody (IGG): <20 U (ref <20)

## 2017-04-18 LAB — HEPATITIS C ANTIBODY
Hepatitis C Ab: NONREACTIVE
SIGNAL TO CUT-OFF: 0.04 (ref <1.00)

## 2017-04-18 LAB — HEPATITIS B SURFACE ANTIGEN: HEP B S AG: NONREACTIVE

## 2017-04-18 LAB — HEPATITIS B SURFACE ANTIBODY,QUALITATIVE: HEP B S AB: NONREACTIVE

## 2017-04-18 LAB — MITOCHONDRIAL ANTIBODIES

## 2017-04-20 ENCOUNTER — Ambulatory Visit: Payer: Medicare Other | Admitting: Orthotics

## 2017-04-20 NOTE — Progress Notes (Signed)
  Subjective:  Patient ID: [name], female    DOB: [birth year],  MRN: [id number]  Chief Complaint  Patient presents with  . Callouses    b/l - "they look about the same to me"    67 y.o. female returns for wound care. Believes the wound to be about the same.. Denies N/V/F/Ch.  Objective:  There were no vitals filed for this visit. General AA&O x3. Normal mood and affect.  Vascular Foot warm to touch.  Neurologic Sensation grossly diminished.  Dermatologic (Wound) Wound Location: Right fifth metatarsal head Wound Measurement: 0.3x0.3x0.1 Wound Base: Granular/Healthy Peri-wound: Calloused Exudate: None: wound tissue dry  Left first metatarsal head small pinpoint ulceration after debridement of lesion.   Wound progress: No Change since last check.  Orthopedic: No pain to palpation either foot.   Assessment & Plan:  Patient was evaluated and treated and all questions answered.  Ulcer right fifth metatarsal head, L 1st MPJ -Debridement as below. -Dressed with silvadene, DSD.  Procedure: Selective Debridement of Wound Rationale: Removal of devitalized tissue from the wound to promote healing.  Pre-Debridement Wound Measurements: 0.3 cm x 0.3 cm x 0.1 cm  Post-Debridement Wound Measurements: same as pre-debridement. Type of Debridement: Selective Tissue Removed: Devitalized soft-tissue Instrumentation: 3-0 mm dermal curette Dressing: Dry, sterile, compression dressing. Disposition: Patient tolerated procedure well. Patient to return in 1 week for follow-up.  Return in about 1 week (around 04/08/2017) for Wound Care.

## 2017-04-26 ENCOUNTER — Ambulatory Visit (INDEPENDENT_AMBULATORY_CARE_PROVIDER_SITE_OTHER)
Admission: RE | Admit: 2017-04-26 | Discharge: 2017-04-26 | Disposition: A | Discharge status: Home / Self Care | Payer: Medicare Other | Source: Ambulatory Visit | Attending: Internal Medicine | Admitting: Internal Medicine

## 2017-04-26 DIAGNOSIS — R932 Abnormal findings on diagnostic imaging of liver and biliary tract: Secondary | ICD-10-CM

## 2017-04-26 DIAGNOSIS — R945 Abnormal results of liver function studies: Secondary | ICD-10-CM | POA: Diagnosis not present

## 2017-04-26 DIAGNOSIS — R7989 Other specified abnormal findings of blood chemistry: Secondary | ICD-10-CM

## 2017-04-26 MED ORDER — IOPAMIDOL (ISOVUE-300) INJECTION 61%
100.0000 mL | Freq: Once | INTRAVENOUS | Status: AC | PRN
  Administered 2017-04-26: 100 mL via INTRAVENOUS

## 2017-04-30 ENCOUNTER — Ambulatory Visit (INDEPENDENT_AMBULATORY_CARE_PROVIDER_SITE_OTHER): Payer: Medicare Other | Admitting: Podiatry

## 2017-04-30 DIAGNOSIS — M2041 Other hammer toe(s) (acquired), right foot: Secondary | ICD-10-CM | POA: Diagnosis not present

## 2017-04-30 DIAGNOSIS — L97501 Non-pressure chronic ulcer of other part of unspecified foot limited to breakdown of skin: Secondary | ICD-10-CM

## 2017-04-30 DIAGNOSIS — L84 Corns and callosities: Secondary | ICD-10-CM

## 2017-04-30 DIAGNOSIS — L97411 Non-pressure chronic ulcer of right heel and midfoot limited to breakdown of skin: Secondary | ICD-10-CM | POA: Diagnosis not present

## 2017-04-30 DIAGNOSIS — M2042 Other hammer toe(s) (acquired), left foot: Secondary | ICD-10-CM | POA: Diagnosis not present

## 2017-04-30 DIAGNOSIS — E08621 Diabetes mellitus due to underlying condition with foot ulcer: Secondary | ICD-10-CM

## 2017-04-30 DIAGNOSIS — E1142 Type 2 diabetes mellitus with diabetic polyneuropathy: Secondary | ICD-10-CM | POA: Diagnosis not present

## 2017-04-30 MED ORDER — SILVER SULFADIAZINE 1 % EX CREA: TOPICAL_CREAM | CUTANEOUS | 0 refills | Status: DC

## 2017-05-04 NOTE — Progress Notes (Signed)
  Subjective:  Patient ID: [name], female    DOB: 04-Oct-1950,  MRN: [id number]  No chief complaint on file.  67 y.o. female returns for the above complaint.  States that the wounds are doing about the same.  Also here for pickup of her diabetic shoes.  Objective:  There were no vitals filed for this visit. General AA&O x3. Normal mood and affect.  Vascular Pedal pulses palpable.  Neurologic Epicritic sensation grossly diminished.  Dermatologic  wound right fifth MPJ pinpoint with areas of thin epithelialization.  No drainage.  Surrounding callus formation.  No ascending cellulitis no warmth erythema  Orthopedic: No pain to palpation either foot.   Assessment & Plan:  Patient was evaluated and treated and all questions answered.  Ulcer right fifth metatarsal head, left first MPJ -Pinpoint wounds almost completely epithelialized. -Rx for silvadene. -Debrided as below.  Procedure: Selective Debridement of Wound Rationale: Removal of devitalized tissue from the wound to promote healing.  Pre-Debridement Wound Measurements: 0.1 cm x 0.1 cm x 0.1 cm  Post-Debridement Wound Measurements: same as pre-debridement. Type of Debridement: Selective Tissue Removed: Devitalized soft-tissue Instrumentation: 3-0 mm dermal curette Dressing: Dry, sterile, compression dressing. Disposition: Patient tolerated procedure well. Patient to return in 1 week for follow-up.  Diabetic peripheral neuropathy, hammertoe formation, pre-ulcerative calluses -Diabetic shoes dispensed today.  Educated on proper wearing.  Discussed break in period.  Return in about 2 weeks (around 05/14/2017) for Wound Care.

## 2017-05-05 ENCOUNTER — Other Ambulatory Visit: Payer: Self-pay

## 2017-05-05 DIAGNOSIS — K769 Liver disease, unspecified: Secondary | ICD-10-CM

## 2017-05-07 ENCOUNTER — Ambulatory Visit: Payer: Medicare Other | Admitting: Orthotics

## 2017-05-07 ENCOUNTER — Other Ambulatory Visit: Payer: Medicare Other | Admitting: Orthotics

## 2017-05-07 DIAGNOSIS — L84 Corns and callosities: Secondary | ICD-10-CM

## 2017-05-07 NOTE — Progress Notes (Signed)
redordered shos apex 532w in 9.5 M

## 2017-05-11 ENCOUNTER — Ambulatory Visit (HOSPITAL_COMMUNITY)
Admission: RE | Admit: 2017-05-11 | Discharge: 2017-05-11 | Disposition: A | Discharge status: Home / Self Care | Payer: Medicare Other | Source: Ambulatory Visit | Attending: Internal Medicine | Admitting: Internal Medicine

## 2017-05-11 DIAGNOSIS — Z9049 Acquired absence of other specified parts of digestive tract: Secondary | ICD-10-CM | POA: Diagnosis not present

## 2017-05-11 DIAGNOSIS — K861 Other chronic pancreatitis: Secondary | ICD-10-CM | POA: Diagnosis not present

## 2017-05-11 DIAGNOSIS — K769 Liver disease, unspecified: Secondary | ICD-10-CM | POA: Insufficient documentation

## 2017-05-11 MED ORDER — GADOBENATE DIMEGLUMINE 529 MG/ML IV SOLN
15.0000 mL | Freq: Once | INTRAVENOUS | Status: AC | PRN
  Administered 2017-05-11: 12 mL via INTRAVENOUS

## 2017-05-14 ENCOUNTER — Ambulatory Visit: Payer: Medicare Other | Admitting: Podiatry

## 2017-05-17 ENCOUNTER — Telehealth: Payer: Self-pay | Admitting: Internal Medicine

## 2017-05-17 MED ORDER — DICYCLOMINE HCL 10 MG PO CAPS: 10.0000 mg | ORAL_CAPSULE | Freq: Three times a day (TID) | ORAL | 0 refills | Status: DC

## 2017-05-17 NOTE — Telephone Encounter (Signed)
Bentyl or Levsin

## 2017-05-17 NOTE — Telephone Encounter (Signed)
Script sent to pharmacy, pt aware. 

## 2017-05-17 NOTE — Telephone Encounter (Signed)
Pt calling stating that every other morning she is having issues with abdominal cramping. Pt requesting something be called in for the cramping pain she is having across her abdomen. Please advise.

## 2017-05-20 ENCOUNTER — Ambulatory Visit (INDEPENDENT_AMBULATORY_CARE_PROVIDER_SITE_OTHER): Payer: Medicare Other

## 2017-05-20 ENCOUNTER — Ambulatory Visit (INDEPENDENT_AMBULATORY_CARE_PROVIDER_SITE_OTHER): Payer: Medicare Other | Admitting: Podiatry

## 2017-05-20 DIAGNOSIS — L97411 Non-pressure chronic ulcer of right heel and midfoot limited to breakdown of skin: Secondary | ICD-10-CM | POA: Diagnosis not present

## 2017-05-20 DIAGNOSIS — E1142 Type 2 diabetes mellitus with diabetic polyneuropathy: Secondary | ICD-10-CM | POA: Diagnosis not present

## 2017-05-20 DIAGNOSIS — E08621 Diabetes mellitus due to underlying condition with foot ulcer: Secondary | ICD-10-CM | POA: Diagnosis not present

## 2017-05-20 DIAGNOSIS — L84 Corns and callosities: Secondary | ICD-10-CM

## 2017-05-20 DIAGNOSIS — B351 Tinea unguium: Secondary | ICD-10-CM | POA: Diagnosis not present

## 2017-05-20 NOTE — Progress Notes (Signed)
  Subjective:  Patient ID: [name], female    DOB: 10-01-1950,  MRN: [id number]  Chief Complaint  Patient presents with  . Diabetic Ulcer    right - very sore and has been bleeding   67 y.o. female returns for the above complaint.  States that the wound on the right foot is sore has worsened and has been bleeding.  Denies nausea vomiting fever chills.  Objective:  There were no vitals filed for this visit. General AA&O x3. Normal mood and affect.  Vascular Pedal pulses palpable.  Neurologic Epicritic sensation grossly diminished.  Protective sensation absent  Dermatologic  Ulcer to the right fifth metatarsal head measuring 1 x 0.5 pre-, 1.0x1.0 post-debridement.  Wound base granular with hyperkeratotic periwound.  No undermining.  No ascending cellulitis.  No warmth or erythema.  HP K first MPJ right.  First and fifth MPJ left  Orthopedic: No pain to palpation either foot.   Assessment & Plan:  Patient was evaluated and treated and all questions answered.  Ulcer right fifth metatarsal head, -Reopening of the ulceration noted -Discussed possible surgical intervention including excision of the fifth metatarsal head should ulcer continue to remain open.  Patient wishes to hold off at this time. -Awaiting adjustment of her diabetic shoes  Procedure: Excisional Debridement of Wound Rationale: Removal of non-viable soft tissue from the wound to promote healing.  Anesthesia: none Pre-Debridement Wound Measurements: 1 cm x 0.5 cm x 0.1 cm  Post-Debridement Wound Measurements: 1 cm x 1 cm x 0.1 cm  Type of Debridement: Excisional Tissue Removed: Non-viable soft tissue Depth of Debridement: subq Instrumentation: 312 blade, tissue nipper. Technique: Sharp excisional debridement to bleeding, viable wound base.  Dressing: Dry, sterile, compression dressing. Disposition: Patient tolerated procedure well. Patient to return in 1 week for follow-up.  DM with peripheral neuropathy,  pre-ulcerative calluses, onychomycosis -Routine foot care as below  Procedure: Paring of Lesion Rationale: painful hyperkeratotic lesion Type of Debridement: manual, sharp debridement. Instrumentation: 312 blade Number of Lesions: 3  Procedure: Nail Debridement Rationale: Patient meets criteria for routine foot care due to 1 Class B, 2 Class C findings. Type of Debridement: manual, sharp debridement. Instrumentation: Nail nipper, rotary burr. Number of Nails: 10      Return in about 2 weeks (around 06/03/2017).

## 2017-05-21 ENCOUNTER — Telehealth: Payer: Self-pay

## 2017-05-21 NOTE — Telephone Encounter (Signed)
Spoke with patient, she is going to stop the Bentyl and see if her symptoms improve.  She will call with an update after stopping the med.

## 2017-05-24 ENCOUNTER — Other Ambulatory Visit: Payer: Medicare Other | Admitting: Orthotics

## 2017-05-24 ENCOUNTER — Telehealth: Payer: Self-pay

## 2017-05-24 NOTE — Telephone Encounter (Signed)
Pt states the greasy discharge she was having stopped when she stopped the bentyl. States she does not have discomfort every day, it is every other day. States she will discuss this with Dr. Henrene Pastor when she sees Dr. Henrene Pastor on Thursday.

## 2017-05-27 ENCOUNTER — Ambulatory Visit (INDEPENDENT_AMBULATORY_CARE_PROVIDER_SITE_OTHER): Payer: Medicare Other | Admitting: Internal Medicine

## 2017-05-27 ENCOUNTER — Encounter: Payer: Self-pay | Admitting: Internal Medicine

## 2017-05-27 VITALS — BP 124/62 | HR 65 | Ht 65.0 in | Wt 133.0 lb

## 2017-05-27 DIAGNOSIS — K861 Other chronic pancreatitis: Secondary | ICD-10-CM

## 2017-05-27 DIAGNOSIS — K7030 Alcoholic cirrhosis of liver without ascites: Secondary | ICD-10-CM

## 2017-05-27 DIAGNOSIS — K7460 Unspecified cirrhosis of liver: Secondary | ICD-10-CM

## 2017-05-27 DIAGNOSIS — R932 Abnormal findings on diagnostic imaging of liver and biliary tract: Secondary | ICD-10-CM

## 2017-05-27 NOTE — Progress Notes (Signed)
HISTORY OF PRESENT ILLNESS:  [name] is a 67 y.o. 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 Patient was last seen in this office 04/15/2017 regarding elevated hepatic transaminases. This was felt most likely secondary to alcohol. Multiple viral and nonviral studies were obtained to evaluate for multiple possible causes of elevated hepatic transaminases. These returned normal or negative. She did have previous imaging that revealed indeterminate lesions of the liver. Thus, we scheduled a CT scan of the liver which was followed by MRI of the liver. It appears that patient has underlying cirrhosis with regenerative nodules but nothing concerning for Heeia. She presents today for follow-up. The patient confirms that she had been a very heavy alcohol user in the past. She tells me that she has not had alcohol since January of this year. She is very concerned about her condition. Of importance, platelet count from last CBC was 114,000. Her last INR was normal at 1.0. Most recent bilirubin was normal. No new complaints.  REVIEW OF SYSTEMS:  All non-GI ROS negative except for arthritis, back pain, heart murmur, night sweats, nosebleed, sore throat, ankle swelling  Past Medical History:  Diagnosis Date  . ANEMIA, CHRONIC DISEASE NEC 03/15/2006  . Colon polyps   . Constipation due to opioid therapy 06/05/2014  . DEGENERATIVE JOINT DISEASE 05/06/2007   On going for >5 yrs No imaging to confirm Has tried ultram, mobic, neurontin which did not work Ambulance person tried once worked well    . Diabetes mellitus   . DIABETIC  RETINOPATHY 03/15/2006  . 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 Diastolic dysfunction 50/93/2671   Grade 1 by Echocardiogram 12/13/14  . Heart  murmur   . Hemorrhoids   . History of kidney stones   . Hyperlipidemia 04/01/2011  . Hypertension   . Moderate aortic regurgitation 09/25/2008   12/13/14 Echocardiogram    . PANCREATITIS, CHRONIC 03/29/2006  . Peripheral arterial disease ([place])   . Pneumonia    age 18-20  . RENAL INSUFFICIENCY, CHRONIC 03/15/2006    Past Surgical History:  Procedure Laterality Date  . BREAST SURGERY    . COLONOSCOPY    . DILATION AND CURETTAGE OF UTERUS N/A 06/16/2016   Procedure: DILATATION AND CURETTAGE;  Surgeon: Mora Bellman, MD;  Location: [facility];  Service: Gynecology;  Laterality: N/A;  . HYSTEROSCOPY N/A 06/16/2016   Procedure: HYSTEROSCOPY;  Surgeon: Mora Bellman, MD;  Location: [facility];  Service: Gynecology;  Laterality: N/A;  . MOUTH SURGERY N/A   . POLYPECTOMY N/A 06/16/2016   Procedure: POLYPECTOMY;  Surgeon: Mora Bellman, MD;  Location: [facility];  Service: Gynecology;  Laterality: N/A;    Social History Jamari C Mowbray  reports that she quit smoking about 4 months ago. Her smoking use included cigarettes. She has a 22.00 pack-year smoking history. She has never used smokeless tobacco. She reports that she drinks alcohol. She reports that she does not use drugs.  family history includes Dementia in her mother.  Allergies  Allergen Reactions  . Gabapentin Swelling    Legs and feet  . Nicorette [Nicotine] Nausea Only       PHYSICAL EXAMINATION: Vital signs: BP 124/62   Pulse 65   Ht 5' 5"  (1.651 m)   Wt 133 lb (60.3 kg)   LMP 02/22/2016   BMI 22.13  kg/m   Constitutional: generally well-appearing, no acute distress Psychiatric: alert and oriented x3, cooperative Eyes: extraocular movements intact, anicteric, conjunctiva pink Mouth: oral pharynx moist, no lesions Neck: supple no lymphadenopathy Cardiovascular: heart regular rate and rhythm, no murmur Lungs: clear to auscultation bilaterally Abdomen: soft, nontender, nondistended, no obvious ascites, no peritoneal signs, normal  bowel sounds, no organomegaly Rectal:omitted Extremities: no clubbing, cyanosis, or significant lower extremity edema bilaterally Skin: no lesions on visible extremities Neuro: No focal deficits. No asterixis.    ASSESSMENT:  #1. Abnormal liver tests secondary to chronic alcohol use #2. 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 Compensated #3. Abnormal imaging of the liver demonstrating regenerative nodules with underlying cirrhosis #4. History of chronic calcific pancreatitis with pancreatic insufficiency secondary to alcohol #5. History of adenomatous colon polyps on colonoscopy May 2018   PLAN:  #1. Reviewed workup and imaging in detail #2. Stop all alcohol for ever #3. Routine office follow-up one year #4. Resume general medical care with PCP #5. Surveillance colonoscopy around May 2023  25 minutes spent face-to-face with the patient. Greater than 50% a time use for counseling regarding her alcoholic liver disease

## 2017-05-27 NOTE — Patient Instructions (Signed)
Continue to abstain from alcohol  Please follow up in one year

## 2017-05-31 DIAGNOSIS — R040 Epistaxis: Secondary | ICD-10-CM | POA: Insufficient documentation

## 2017-05-31 DIAGNOSIS — R070 Pain in throat: Secondary | ICD-10-CM | POA: Insufficient documentation

## 2017-06-01 ENCOUNTER — Encounter (HOSPITAL_COMMUNITY): Payer: Self-pay | Admitting: Vascular Surgery

## 2017-06-01 ENCOUNTER — Other Ambulatory Visit: Payer: Self-pay | Admitting: Otolaryngology

## 2017-06-01 DIAGNOSIS — R221 Localized swelling, mass and lump, neck: Secondary | ICD-10-CM

## 2017-06-01 DIAGNOSIS — R070 Pain in throat: Secondary | ICD-10-CM

## 2017-06-01 NOTE — Progress Notes (Addendum)
Anesthesia Chart Review: Patient is a 67 year old female who is scheduled for direct laryngoscopy with left biopsies on 06/18/17 by Dr. Lind Guest. Dx: Left tonsillar lesion, dysphagia. Patient was just seen by Dr. Blenda Nicely yesterday for recurrent epistaxis. There was neck fullness. Transnasal flexible laryngoscopy showed a small lesion along the left anterior tonsil pole which was difficult to visualize due to patient cooperativity. The above procedure was recommended in the near future. Dr. Blenda Nicely requested an anesthesia consult prior to surgery date due to patient's co-morbidities.   History includes former smoker (quit 01/23/17), DM2 with retinopathy/peripheral neuropathy, CKD stage III, HTN, diastolic dysfunction, murmur/aortic regurgitation (moderate AR 11/2014), ETOH abuse (no liquor since 2007; no beer since 02/2017), chronic pancreatitis, elevated LFTs, anemia of chronic disease, HLD, PAD, cholecystectomy '06, left breast ductal abscess excision 06/03/10, hysteroscopy (polypectomy/D&C) 06/16/16.  - ED visit 03/24/17 for right chest pain and back pain. EKG non-ischemic, labs reassuring, CTA showed no PE or PNA. Provider felt symptoms likely from pleurisy from her on-going coughing.  - ED visit 02/12/17 for right shoulder pain. Troponin negative. EKG stable. CXR showed interstitial markings which could indicate fluid overload or interstitial pneumonitis. Symptoms felt consistent with pleurisy.  - ED visit 12/31/16 for hypoglycemia.  - PCP is Dr. Nolene Ebbs ([place] office). She reports last visit for routine follow-up was 06/01/17. - GI is Dr. Scarlette Shorts. 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 She reported no ETOH since 02/2017. MRI showed very mild cirrhosis, but no findings suggestive of HCC. One year follow-up  recommended with surveillance colonoscopy ~ 2023.     - Nephrologist is Dr. Edrick Oh. 01/29/17 office note is scanned under the Media tab.  - She was referred to cardiologist Dr. Quay Burow on 12/12/13 for evaluation of PAD. She had occluded bilateral PT arteries, but felt to have adequate circulation to hear her right foot wound. PRN follow-up recommended.   Vitals on 05/31/17 showed BP 105/64, pulse 75. BMI 21.7.   Medications by 05/31/17 ENT visit include amlodipine, aspirin 81 mg, Lipitor, Januvia, Creon, Lantus, linaclotide, ProAir HFA, promethazine, Zanaflex, tramadol.  EKG 03/24/17: Normal sinus rhythm, LVH with repolarization abnormality.  Cannot rule out septal infarct, age undetermined.  No significant change since last tracing on 02/12/17.  Echo 12/13/14 (ordered by PCP at the time for murmur/AR re-evaluation): Study Conclusions - Left ventricle: The cavity size was normal. Wall thickness was   increased in a pattern of severe LVH. Systolic function was   vigorous. The estimated ejection fraction was in the range of 65%   to 70%. Wall motion was normal; there were no regional wall   motion abnormalities. Doppler parameters are consistent with   abnormal left ventricular relaxation (grade 1 diastolic   dysfunction). - Aortic valve: There was moderate regurgitation.  CXR 03/24/17: FINDINGS: Heart size and mediastinal contours are stable. Lungs are clear. No pleural effusion or pneumothorax seen. No acute or suspicious osseous finding. IMPRESSION: No active cardiopulmonary disease. No evidence of pneumonia or pulmonary edema.  CT soft tissue neck is scheduled for 06/10/17.  MRI Liver 05/11/17: IMPRESSION: - Numerous subcentimeter enhancing nodules throughout the liver, favoring regenerating nodules in the setting of very mild cirrhosis. No findings suspicious for hepatocellular carcinoma. - Sequela of chronic pancreatitis. - Status post cholecystectomy.  Last labs on  04/15/17 showed a glucose of 328--although she reports fasting CBGs  typically ~ 100-120. Cr 1.18. H/H 10.1/29.9 with PLT count 114 03/14/17. LFTs elevated 12/31/16 with AST 100, ALT 138. Her last known A1c was 7.6 last year (06/25/16).  Patient could not schedule her PAT until 06/11/17 due to other appointments, so I called and spoke with her. She denied chest pain and SOB. Says she can lie flat. Reports dependent LE edema on some days (not new), that is better in the morning. She does not have to take diuretics therapy for her symptoms. She will be evaluated at that time. She had a few ED visit within the past six months for right shoulder/right chest pain which was not felt to be cardiac in nature. She reports her PCP Dr. Jeanie Cooks did further imaging and told her it was arthritis. She is not active on a routine basis, but there are days that she says she goes to the park with her grand kids and shots hoops without CV symptoms. She occasionally may get short term dizziness upon standing, but denied syncope or palpitations/racing heart. She denied hoarseness. She continues to be sober. She has occasional morning abdominal pains, but has not required any recent ED visits/hospitalizations for abdominal pain. She had moderate AR by echo within the past three years. She did not describe symptoms worrisome for severe AR. Discussed currently available information with anesthesiologist Dr. Roberts Gaudy. She will be further evaluated and get updated labs at her PAT visit, but if findings stable then it is anticipated that she can proceed with planned surgery.     She has not been instructed if she is to hold ASA prior to surgery, so I left a voice message at Dr. Trish Mage office to notify patient or to let me know so patient can be instructed when she comes in 06/11/17.    George Hugh [facility] Short Stay Center/Anesthesiology Phone [phone] 06/01/2017 4:58 PM  Addendum: Patient evaluated at her PAT visit  this morning. She denied any new changes/symptoms since we talked on 06/01/17. She states Dr. Blenda Nicely instructed her to hold ASA 3 days prior to her procedure. She remains a non-smoker and sober.  On exam, she is a pleasant black female in NAD. She has complete dentures. No conversational dyspnea. Heart RRR, II/VI soft murmur. No carotid bruits noted. Lungs clear. No ankle edema.   Vitals: BP 155/55, heart rate 64, temperature 36.9 Celsius, respiratory rate 20, O2 sat 100%. BMI 21.65. CBG 182.   Her neck CT from 06/10/17 showed: IMPRESSION: 1. Subtle broad-based contour abnormality of the anterior right laryngeal vocal cord versus false cord (series 2, image 57). Otherwise the laryngeal and pharyngeal soft tissue contours appear normal. 2. No lymphadenopathy, and otherwise negative neck CT. 3. Aortic Atherosclerosis (ICD10-I70.0) and Emphysema (ICD10-J43.9).  Preoperative labs noted. Cr 1.23. Glucose 164. AST 55, ALT 73 which appear within her recent baseline range (AST 60-100, ALT 89-138 from 10/24/16-12/31/16). A1c is 8.7. WBC 3.5, H/H 10.8/33.0, PLT 147K. PT/PTT WNL. She reports fasting CBGs < 200.   Patient appeared stable on exam. No new changes since previously reviewed with anesthesiologist. Since her last echo is > 59 years old, I did encourage her to talk with Dr. Jeanie Cooks regarding timing of next surveillance echo.   George Hugh [facility] Short Stay Center/Anesthesiology Phone [phone] 06/11/2017 12:53 PM

## 2017-06-03 ENCOUNTER — Ambulatory Visit (INDEPENDENT_AMBULATORY_CARE_PROVIDER_SITE_OTHER): Payer: Medicare Other | Admitting: Podiatry

## 2017-06-03 DIAGNOSIS — L97411 Non-pressure chronic ulcer of right heel and midfoot limited to breakdown of skin: Secondary | ICD-10-CM

## 2017-06-03 DIAGNOSIS — E08621 Diabetes mellitus due to underlying condition with foot ulcer: Secondary | ICD-10-CM | POA: Diagnosis not present

## 2017-06-08 ENCOUNTER — Other Ambulatory Visit: Payer: Medicare Other | Admitting: Orthotics

## 2017-06-10 ENCOUNTER — Ambulatory Visit
Admission: RE | Admit: 2017-06-10 | Discharge: 2017-06-10 | Disposition: A | Discharge status: Home / Self Care | Payer: Medicare Other | Source: Ambulatory Visit | Attending: Otolaryngology | Admitting: Otolaryngology

## 2017-06-10 DIAGNOSIS — R070 Pain in throat: Secondary | ICD-10-CM

## 2017-06-10 DIAGNOSIS — R221 Localized swelling, mass and lump, neck: Secondary | ICD-10-CM

## 2017-06-10 MED ORDER — IOPAMIDOL (ISOVUE-300) INJECTION 61%
75.0000 mL | Freq: Once | INTRAVENOUS | Status: AC | PRN
  Administered 2017-06-10: 75 mL via INTRAVENOUS

## 2017-06-10 NOTE — Pre-Procedure Instructions (Signed)
[name]  06/10/2017      [id number] - [place], [address] [place] [address] Phone: [phone] Fax: [phone]  [id number] - [place], [place] - [address] [place] [address] Phone: [phone] Fax: [phone]    Your procedure is scheduled on 06/18/2017 .  Report to [facility] Admitting at [phone] A.M.  Call this number if you have problems the morning of surgery:  [phone]   Remember:  Do not eat food or drink liquids after midnight.   Continue all medications as directed by your physician except follow these medication instructions before surgery below   Take these medicines the morning of surgery with A SIP OF WATER: Amlodipine (Norvasc) Proair inhaler - if needed Tizanidine (Zanaflex) - if needed Tramadol (Ultram) - if needed  7 days prior to surgery STOP taking any Diclofenac (Voltaren) gel, Aleve, Naproxen, Ibuprofen, Motrin, Advil, Goody's, BC's, all herbal medications, fish oil, and all vitamins  Follow your doctors instructions regarding your Aspirin.  If no instructions were given by your doctor, then you will need to call the prescribing office office to get instructions.     How to Manage Your Diabetes Before and After Surgery  Why is it important to control my blood sugar before and after surgery? . Improving blood sugar levels before and after surgery helps healing and can limit problems. . A way of improving blood sugar control is eating a healthy diet by: o  Eating less sugar and carbohydrates o  Increasing activity/exercise o  Talking with your doctor about reaching your blood sugar goals . High blood sugars (greater than 180 mg/dL) can raise your risk of infections and slow your recovery, so you will need to focus on  controlling your diabetes during the weeks before surgery. . Make sure that the doctor who takes care of your diabetes knows about your planned surgery including the date and location.  How do I manage my blood sugar before surgery? . Check your blood sugar at least 4 times a day, starting 2 days before surgery, to make sure that the level is not too high or low. o Check your blood sugar the morning of your surgery when you wake up and every 2 hours until you get to the Short Stay unit. . If your blood sugar is less than 70 mg/dL, you will need to treat for low blood sugar: o Do not take insulin. o Treat a low blood sugar (less than 70 mg/dL) with  cup of clear juice (cranberry or apple), 4 glucose tablets, OR glucose gel. Recheck blood sugar in 15 minutes after treatment (to make sure it is greater than 70 mg/dL). If your blood sugar is not greater than 70 mg/dL on recheck, call [phone] o  for further instructions. . Report your blood sugar to the short stay nurse when you get to Short Stay.  . If you are admitted to the hospital after surgery: o Your blood sugar will be checked by the staff and you will probably be given insulin after surgery (instead of oral diabetes medicines) to make sure you have good blood sugar levels. o The goal for blood sugar control after surgery is 80-180 mg/dL.    WHAT DO I DO ABOUT MY DIABETES MEDICATION?   Marland Kitchen Do not take  oral diabetes medicines (pills) the morning of surgery. - do not take Sitagliptin (Januvia)  . THE NIGHT BEFORE SURGERY, take ___________ units of ___________insulin.     . THE MORNING OF SURGERY, take _____________ units of __________insulin.      Do not wear jewelry, make-up or nail polish.  Do not wear lotions, powders, or perfumes, or deodorant.  Do not shave 48 hours prior to surgery.    Do not bring valuables to the hospital.  [facility] is not responsible for any belongings or valuables.   Hearing aids, eyeglasses,  contacts, dentures or bridgework may not be worn into surgery.  Leave your suitcase in the car.  After surgery it may be brought to your room.  For patients admitted to the hospital, discharge time will be determined by your treatment team.  Patients discharged the day of surgery will not be allowed to drive home.   Name and phone number of your driver:   Special instructions:   [facility]- Preparing For Surgery  Before surgery, you can play an important role. Because skin is not sterile, your skin needs to be as free of germs as possible. You can reduce the number of germs on your skin by washing with CHG (chlorahexidine gluconate) Soap before surgery.  CHG is an antiseptic cleaner which kills germs and bonds with the skin to continue killing germs even after washing.  Please do not use if you have an allergy to CHG or antibacterial soaps. If your skin becomes reddened/irritated stop using the CHG.  Do not shave (including legs and underarms) for at least 48 hours prior to first CHG shower. It is OK to shave your face.  Please follow these instructions carefully.   1. Shower the NIGHT BEFORE SURGERY and the MORNING OF SURGERY with CHG.   2. If you chose to wash your hair, wash your hair first as usual with your normal shampoo.  3. After you shampoo, rinse your hair and body thoroughly to remove the shampoo.  4. Use CHG as you would any other liquid soap. You can apply CHG directly to the skin and wash gently with a scrungie or a clean washcloth.   5. Apply the CHG Soap to your body ONLY FROM THE NECK DOWN.  Do not use on open wounds or open sores. Avoid contact with your eyes, ears, mouth and genitals (private parts). Wash Face and genitals (private parts)  with your normal soap.  6. Wash thoroughly, paying special attention to the area where your surgery will be performed.  7. Thoroughly rinse your body with warm water from the neck down.  8. DO NOT shower/wash with your normal soap  after using and rinsing off the CHG Soap.  9. Pat yourself dry with a CLEAN TOWEL.  10. Wear CLEAN PAJAMAS to bed the night before surgery, wear comfortable clothes the morning of surgery  11. Place CLEAN SHEETS on your bed the night of your first shower and DO NOT SLEEP WITH PETS.    Day of Surgery: Shower as stated above. Do not apply any deodorants/lotions.  Please wear clean clothes to the hospital/surgery center.      Please read over the following fact sheets that you were given.

## 2017-06-11 ENCOUNTER — Encounter (HOSPITAL_COMMUNITY): Payer: Self-pay

## 2017-06-11 ENCOUNTER — Encounter (HOSPITAL_COMMUNITY)
Admission: RE | Admit: 2017-06-11 | Discharge: 2017-06-11 | Disposition: A | Discharge status: Home / Self Care | Payer: Medicare Other | Source: Ambulatory Visit | Attending: Otolaryngology | Admitting: Otolaryngology

## 2017-06-11 ENCOUNTER — Other Ambulatory Visit: Payer: Self-pay

## 2017-06-11 DIAGNOSIS — J359 Chronic disease of tonsils and adenoids, unspecified: Secondary | ICD-10-CM | POA: Diagnosis not present

## 2017-06-11 DIAGNOSIS — Z01812 Encounter for preprocedural laboratory examination: Secondary | ICD-10-CM | POA: Diagnosis present

## 2017-06-11 DIAGNOSIS — I129 Hypertensive chronic kidney disease with stage 1 through stage 4 chronic kidney disease, or unspecified chronic kidney disease: Secondary | ICD-10-CM | POA: Insufficient documentation

## 2017-06-11 DIAGNOSIS — Z7982 Long term (current) use of aspirin: Secondary | ICD-10-CM | POA: Insufficient documentation

## 2017-06-11 DIAGNOSIS — E1151 Type 2 diabetes mellitus with diabetic peripheral angiopathy without gangrene: Secondary | ICD-10-CM | POA: Diagnosis not present

## 2017-06-11 DIAGNOSIS — Z87891 Personal history of nicotine dependence: Secondary | ICD-10-CM | POA: Insufficient documentation

## 2017-06-11 DIAGNOSIS — N183 Chronic kidney disease, stage 3 (moderate): Secondary | ICD-10-CM | POA: Diagnosis not present

## 2017-06-11 DIAGNOSIS — R1310 Dysphagia, unspecified: Secondary | ICD-10-CM | POA: Diagnosis not present

## 2017-06-11 DIAGNOSIS — Z9049 Acquired absence of other specified parts of digestive tract: Secondary | ICD-10-CM | POA: Diagnosis not present

## 2017-06-11 DIAGNOSIS — E1122 Type 2 diabetes mellitus with diabetic chronic kidney disease: Secondary | ICD-10-CM | POA: Insufficient documentation

## 2017-06-11 DIAGNOSIS — E785 Hyperlipidemia, unspecified: Secondary | ICD-10-CM | POA: Diagnosis not present

## 2017-06-11 DIAGNOSIS — E11319 Type 2 diabetes mellitus with unspecified diabetic retinopathy without macular edema: Secondary | ICD-10-CM | POA: Diagnosis not present

## 2017-06-11 DIAGNOSIS — E1142 Type 2 diabetes mellitus with diabetic polyneuropathy: Secondary | ICD-10-CM | POA: Diagnosis not present

## 2017-06-11 DIAGNOSIS — I351 Nonrheumatic aortic (valve) insufficiency: Secondary | ICD-10-CM | POA: Diagnosis not present

## 2017-06-11 DIAGNOSIS — Z794 Long term (current) use of insulin: Secondary | ICD-10-CM | POA: Insufficient documentation

## 2017-06-11 DIAGNOSIS — Z79899 Other long term (current) drug therapy: Secondary | ICD-10-CM | POA: Diagnosis not present

## 2017-06-11 HISTORY — DX: Other chronic diseases of tonsils and adenoids: J35.8

## 2017-06-11 HISTORY — DX: Localized swelling, mass and lump, head: R22.0

## 2017-06-11 LAB — COMPREHENSIVE METABOLIC PANEL
ALT: 73 U/L — ABNORMAL HIGH (ref 14–54)
AST: 55 U/L — ABNORMAL HIGH (ref 15–41)
Albumin: 3.5 g/dL (ref 3.5–5.0)
Alkaline Phosphatase: 113 U/L (ref 38–126)
Anion gap: 9 (ref 5–15)
BUN: 37 mg/dL — ABNORMAL HIGH (ref 6–20)
CO2: 21 mmol/L — ABNORMAL LOW (ref 22–32)
Calcium: 9.3 mg/dL (ref 8.9–10.3)
Chloride: 107 mmol/L (ref 101–111)
Creatinine, Ser: 1.23 mg/dL — ABNORMAL HIGH (ref 0.44–1.00)
GFR calc Af Amer: 52 mL/min — ABNORMAL LOW (ref >60)
GFR calc non Af Amer: 45 mL/min — ABNORMAL LOW (ref >60)
Glucose, Bld: 164 mg/dL — ABNORMAL HIGH (ref 65–99)
Potassium: 4.5 mmol/L (ref 3.5–5.1)
Sodium: 137 mmol/L (ref 135–145)
Total Bilirubin: 0.6 mg/dL (ref 0.3–1.2)
Total Protein: 7.6 g/dL (ref 6.5–8.1)

## 2017-06-11 LAB — CBC
HCT: 33 % — ABNORMAL LOW (ref 36.0–46.0)
Hemoglobin: 10.8 g/dL — ABNORMAL LOW (ref 12.0–15.0)
MCH: 27.9 pg (ref 26.0–34.0)
MCHC: 32.7 g/dL (ref 30.0–36.0)
MCV: 85.3 fL (ref 78.0–100.0)
Platelets: 147 10*3/uL — ABNORMAL LOW (ref 150–400)
RBC: 3.87 MIL/uL (ref 3.87–5.11)
RDW: 15.1 % (ref 11.5–15.5)
WBC: 3.5 10*3/uL — ABNORMAL LOW (ref 4.0–10.5)

## 2017-06-11 LAB — HEMOGLOBIN A1C
Hgb A1c MFr Bld: 8.7 % — ABNORMAL HIGH (ref 4.8–5.6)
Mean Plasma Glucose: 202.99 mg/dL

## 2017-06-11 LAB — GLUCOSE, CAPILLARY: Glucose-Capillary: 182 mg/dL — ABNORMAL HIGH (ref 65–99)

## 2017-06-11 LAB — APTT: aPTT: 34 s (ref 24–36)

## 2017-06-11 LAB — PROTIME-INR
INR: 1
Prothrombin Time: 13.1 seconds (ref 11.4–15.2)

## 2017-06-11 NOTE — Progress Notes (Signed)
PCP - Dr. Tollie Pizza  Cardiologist - Denies  Chest x-ray - 03/24/17 (E)  EKG - 03/24/17 (E)  Stress Test - Denies  ECHO - 12/13/14 (E)  Cardiac Cath - Denies  Sleep Study - Denies CPAP - None  LABS- 06/11/17: CBC, CMP, PT, PTT  HA1C- 06/11/17 Fasting Blood Sugar - >200, today 182 Checks Blood Sugar __1___ time a day  Anesthesia- Yes- Cardiac history  Pt denies having chest pain, sob, or fever at this time. All instructions explained to the pt, with a verbal understanding of the material. Pt agrees to go over the instructions while at home for a better understanding. The opportunity to ask questions was provided.

## 2017-06-11 NOTE — Progress Notes (Signed)
[name]            06/10/2017                          [id number] - [place], [place] AT [address] [place] [place] [address] Phone: [phone] Fax: [phone]  [id number] - [place], [place] - [address] [place] [address] Phone: [phone] Fax: [phone]              Your procedure is scheduled on Fri., 06/18/2017 .            Report to [facility] Admitting at [phone] A.M.            Call this number if you have problems the morning of surgery:            [phone]             Remember:            Do not eat food or drink liquids after midnight.             Continue all medications as directed by your physician except follow these medication instructions before surgery below             Take these medicines the morning of surgery with A SIP OF WATER: Amlodipine (Norvasc) Proair inhaler - if needed Tizanidine (Zanaflex) - if needed Tramadol (Ultram) - if needed  7 days prior to surgery STOP taking any Diclofenac (Voltaren) gel, Aleve, Naproxen, Ibuprofen, Motrin, Advil, Goody's, BC's, all herbal medications, fish oil, and all vitamins  Follow your doctors instructions regarding your Aspirin.  If no instructions were given by your doctor, then you will need to call the prescribing office office to get instructions.     HOW TO MANAGE YOUR DIABETES BEFORE AND AFTER SURGERY  Why is it important to control my blood sugar before and after surgery?  Improving blood sugar levels before and after surgery helps healing and can limit problems.  A way of improving blood sugar control is eating a healthy diet by: ?  Eating less sugar and carbohydrates ?  Increasing activity/exercise ?  Talking with your doctor about reaching your blood sugar goals  High blood  sugars (greater than 180 mg/dL) can raise your risk of infections and slow your recovery, so you will need to focus on controlling your diabetes during the weeks before surgery.  Make sure that the doctor who takes care of your diabetes knows about your planned surgery including the date and location.  How do I manage my blood sugar before surgery?  Check your blood sugar at least 4 times a day, starting 2 days before surgery, to make sure that the level is not too high or low. ? Check your blood sugar the morning of your surgery when you wake up and every 2 hours until you get to the Short Stay unit.  If your blood sugar is less than 70 mg/dL, you will need to treat for low blood sugar: ? Do not take insulin. ? Treat a low blood sugar (less than 70 mg/dL) with  cup of clear juice (cranberry or apple), 4glucose tablets, OR glucose gel. Recheck blood sugar in 15 minutes after treatment (to make sure it is greater than 70 mg/dL).  If your blood sugar is not greater than 70 mg/dL on recheck, call [phone] ?  for further instructions.  Report your blood sugar to the short stay nurse when you get to Short Stay.   If you are admitted to the hospital after surgery: ? Your blood sugar will be checked by the staff and you will probably be given insulin after surgery (instead of oral diabetes medicines) to make sure you have good blood sugar levels. ? The goal for blood sugar control after surgery is 80-180 mg/dL.    WHAT DO I DO ABOUT MY DIABETES MEDICATION?   Do not  take Sitagliptin (Januvia)  the morning of surgery.                                   THE MORNING OF SURGERY, take _______5______ units of _____Lantus_____insulin.             Do not wear jewelry, make-up or nail polish.            Do not wear lotions, powders,  perfumes, or deodorant.            Do not shave 48 hours prior to surgery.              Do not bring valuables to the hospital.            The [facility] is  not responsible for any belongings or valuables.   Hearing aids, eyeglasses, contacts, dentures or bridgework may not be worn into surgery.  Leave your suitcase in the car.  After surgery it may be brought to your room.  For patients admitted to the hospital, discharge time will be determined by your treatment team.  Patients discharged the day of surgery will not be allowed to drive home.   Special instructions:   [facility]- Preparing For Surgery  Before surgery, you can play an important role. Because skin is not sterile, your skin needs to be as free of germs as possible. You can reduce the number of germs on your skin by washing with CHG (chlorahexidine gluconate) Soap before surgery.  CHG is an antiseptic cleaner which kills germs and bonds with the skin to continue killing germs even after washing.  Please do not use if you have an allergy to CHG or antibacterial soaps. If your skin becomes reddened/irritated stop using the CHG.  Do not shave (including legs and underarms) for at least 48 hours prior to first CHG shower. It is OK to shave your face.  Please follow these instructions carefully.                                                                                                                     1. Shower the NIGHT BEFORE SURGERY and the MORNING OF SURGERY with CHG.   2. If you chose to wash your hair, wash your hair first as usual with your normal  shampoo.  3. After you shampoo, rinse your hair and body thoroughly to remove the shampoo.  4. Use CHG as you would any other liquid soap. You can apply CHG directly to the skin and wash gently with a scrungie or a clean washcloth.   5. Apply the CHG Soap to your body ONLY FROM THE NECK DOWN.  Do not use on open wounds or open sores. Avoid contact with your eyes, ears, mouth and genitals (private parts). Wash Face and genitals (private parts)  with your normal soap.  6. Wash thoroughly, paying special attention  to the area where your surgery will be performed.  7. Thoroughly rinse your body with warm water from the neck down.  8. DO NOT shower/wash with your normal soap after using and rinsing off the CHG Soap.  9. Pat yourself dry with a CLEAN TOWEL.  10. Wear CLEAN PAJAMAS to bed the night before surgery, wear comfortable clothes the morning of surgery  11. Place CLEAN SHEETS on your bed the night of your first shower and DO NOT SLEEP WITH PETS.  Day of Surgery: Shower as stated above. Do not apply any deodorants/lotions.  Please wear clean clothes to the hospital/surgery center.    Please read over the following fact sheets that you were given.

## 2017-06-15 ENCOUNTER — Ambulatory Visit (INDEPENDENT_AMBULATORY_CARE_PROVIDER_SITE_OTHER): Payer: Self-pay | Admitting: Orthotics

## 2017-06-15 DIAGNOSIS — M2041 Other hammer toe(s) (acquired), right foot: Secondary | ICD-10-CM

## 2017-06-15 DIAGNOSIS — L97411 Non-pressure chronic ulcer of right heel and midfoot limited to breakdown of skin: Secondary | ICD-10-CM

## 2017-06-15 DIAGNOSIS — E08621 Diabetes mellitus due to underlying condition with foot ulcer: Secondary | ICD-10-CM

## 2017-06-15 DIAGNOSIS — M2042 Other hammer toe(s) (acquired), left foot: Secondary | ICD-10-CM

## 2017-06-15 DIAGNOSIS — L84 Corns and callosities: Secondary | ICD-10-CM

## 2017-06-15 NOTE — Progress Notes (Signed)

## 2017-06-17 NOTE — Anesthesia Preprocedure Evaluation (Deleted)
Anesthesia Evaluation    Airway        Dental   Pulmonary former smoker,           Cardiovascular hypertension, Pt. on medications + Peripheral Vascular Disease  + Valvular Problems/Murmurs AI      Neuro/Psych negative neurological ROS     GI/Hepatic Neg liver ROS, GERD  ,  Endo/Other  diabetes, Type 2  Renal/GU CRFRenal disease     Musculoskeletal  (+) Arthritis ,   Abdominal   Peds  Hematology  (+) anemia ,   Anesthesia Other Findings   Reproductive/Obstetrics                             Lab Results  Component Value Date   WBC 3.5 (L) 06/11/2017   HGB 10.8 (L) 06/11/2017   HCT 33.0 (L) 06/11/2017   MCV 85.3 06/11/2017   PLT 147 (L) 06/11/2017   Lab Results  Component Value Date   CREATININE 1.23 (H) 06/11/2017   BUN 37 (H) 06/11/2017   NA 137 06/11/2017   K 4.5 06/11/2017   CL 107 06/11/2017   CO2 21 (L) 06/11/2017    Anesthesia Physical Anesthesia Plan  ASA: III  Anesthesia Plan: General   Post-op Pain Management:    Induction: Intravenous  PONV Risk Score and Plan:   Airway Management Planned: Oral ETT  Additional Equipment:   Intra-op Plan:   Post-operative Plan: Extubation in OR  Informed Consent:   Plan Discussed with:   Anesthesia Plan Comments:         Anesthesia Quick Evaluation

## 2017-06-18 ENCOUNTER — Ambulatory Visit (HOSPITAL_COMMUNITY): Admission: RE | Admit: 2017-06-18 | Payer: Medicare Other | Source: Ambulatory Visit | Admitting: Otolaryngology

## 2017-06-18 ENCOUNTER — Encounter (HOSPITAL_COMMUNITY): Admission: RE | Payer: Self-pay | Source: Ambulatory Visit

## 2017-06-18 SURGERY — LARYNGOSCOPY, DIRECT
Anesthesia: General | Laterality: Left

## 2017-06-18 MED ORDER — EPINEPHRINE HCL (NASAL) 0.1 % NA SOLN
NASAL | Status: AC
  Filled 2017-06-18: qty 30

## 2017-06-22 NOTE — Progress Notes (Signed)
  Subjective:  Patient ID: [name], female    DOB: Mar 27, 1950,  MRN: [id number]  Chief Complaint  Patient presents with  . Wound Check    Ulcer right midfoot - patient feels like she will never get better   67 y.o. female returns for the above complaint.  Is getting frustrated feels like the wound is never going to get better. Objective:  There were no vitals filed for this visit. General AA&O x3. Normal mood and affect.  Vascular Pedal pulses palpable.  Neurologic Epicritic sensation grossly diminished.  Protective sensation absent  Dermatologic  Ulcer to the right fifth metatarsal head measuring 0.5x0.5 post-debridement.  Wound base granular with hyperkeratotic periwound.  No undermining.  No ascending cellulitis.  No warmth or erythema.   HP K first MPJ right.  First and fifth MPJ left  Orthopedic: No pain to palpation either foot.   Assessment & Plan:  Patient was evaluated and treated and all questions answered.  Ulcer right fifth metatarsal head, -Debridement as below. -Dressed with Silvadene and DSD -Should the ulcer persists would consider possible metatarsal head excision.  Procedure: Excisional Debridement of Wound Rationale: Removal of non-viable soft tissue from the wound to promote healing.  Anesthesia: none Pre-Debridement Wound Measurements: overlying hyperkeratosis  Post-Debridement Wound Measurements: 0.5 cm x 0.5 cm x 0.1 cm  Type of Debridement: Excisional Tissue Removed: Non-viable soft tissue Depth of Debridement: subq Instrumentation: 312 blade Technique: Sharp excisional debridement to bleeding, viable wound base.  Dressing: Dry, sterile, compression dressing. Disposition: Patient tolerated procedure well. Patient to return in 1 week for follow-up.  Return in about 1 month (around 07/01/2017) for Wound Care.

## 2017-07-01 ENCOUNTER — Ambulatory Visit (INDEPENDENT_AMBULATORY_CARE_PROVIDER_SITE_OTHER): Payer: Medicare Other | Admitting: Podiatry

## 2017-07-01 ENCOUNTER — Telehealth: Payer: Self-pay | Admitting: *Deleted

## 2017-07-01 ENCOUNTER — Ambulatory Visit (INDEPENDENT_AMBULATORY_CARE_PROVIDER_SITE_OTHER): Payer: Medicare Other

## 2017-07-01 DIAGNOSIS — E08621 Diabetes mellitus due to underlying condition with foot ulcer: Secondary | ICD-10-CM

## 2017-07-01 DIAGNOSIS — L97411 Non-pressure chronic ulcer of right heel and midfoot limited to breakdown of skin: Secondary | ICD-10-CM | POA: Diagnosis not present

## 2017-07-01 MED ORDER — CIPROFLOXACIN HCL 500 MG PO TABS: 500.0000 mg | ORAL_TABLET | Freq: Two times a day (BID) | ORAL | 0 refills | Status: DC

## 2017-07-01 MED ORDER — CLINDAMYCIN HCL 150 MG PO CAPS: 150.0000 mg | ORAL_CAPSULE | Freq: Two times a day (BID) | ORAL | 0 refills | Status: DC

## 2017-07-01 NOTE — Telephone Encounter (Signed)
I am calling to see if you can do surgery on Friday, May 17 instead of May 16.  Yes, that will be fine, that way I can get my daughter to bring me.  She's off on Fridays."  You'll probably have to be there at 6 am.  "That's fine, I'm up by that time every morning anyway."  I will get the time changed from 07/09/2015 to 07/09/2017.

## 2017-07-01 NOTE — Patient Instructions (Signed)
Pre-Operative Instructions  Congratulations, you have decided to take an important step towards improving your quality of life.  You can be assured that the doctors and staff at [facility] will be with you every step of the way.  Here are some important things you should know:  1. Plan to be at the surgery center/hospital at least 1 (one) hour prior to your scheduled time, unless otherwise directed by the surgical center/hospital staff.  You must have a responsible adult accompany you, remain during the surgery and drive you home.  Make sure you have directions to the surgical center/hospital to ensure you arrive on time. 2. If you are having surgery at [facility] or [facility] hospitals, you will need a copy of your medical history and physical form from your family physician within one month prior to the date of surgery. We will give you a form for your primary physician to complete.  3. We make every effort to accommodate the date you request for surgery.  However, there are times where surgery dates or times have to be moved.  We will contact you as soon as possible if a change in schedule is required.   4. No aspirin/ibuprofen for one week before surgery.  If you are on aspirin, any non-steroidal anti-inflammatory medications (Mobic, Aleve, Ibuprofen) should not be taken seven (7) days prior to your surgery.  You make take Tylenol for pain prior to surgery.  5. Medications - If you are taking daily heart and blood pressure medications, seizure, reflux, allergy, asthma, anxiety, pain or diabetes medications, make sure you notify the surgery center/hospital before the day of surgery so they can tell you which medications you should take or avoid the day of surgery. 6. No food or drink after midnight the night before surgery unless directed otherwise by surgical center/hospital staff. 7. No alcoholic beverages 24-hours prior to surgery.  No smoking 24-hours prior or 24-hours after  surgery. 8. Wear loose pants or shorts. They should be loose enough to fit over bandages, boots, and casts. 9. Don't wear slip-on shoes. Sneakers are preferred. 10. Bring your boot with you to the surgery center/hospital.  Also bring crutches or a walker if your physician has prescribed it for you.  If you do not have this equipment, it will be provided for you after surgery. 11. If you have not been contacted by the surgery center/hospital by the day before your surgery, call to confirm the date and time of your surgery. 12. Leave-time from work may vary depending on the type of surgery you have.  Appropriate arrangements should be made prior to surgery with your employer. 13. Prescriptions will be provided immediately following surgery by your doctor.  Fill these as soon as possible after surgery and take the medication as directed. Pain medications will not be refilled on weekends and must be approved by the doctor. 14. Remove nail polish on the operative foot and avoid getting pedicures prior to surgery. 15. Wash the night before surgery.  The night before surgery wash the foot and leg well with water and the antibacterial soap provided. Be sure to pay special attention to beneath the toenails and in between the toes.  Wash for at least three (3) minutes. Rinse thoroughly with water and dry well with a towel.  Perform this wash unless told not to do so by your physician.  Enclosed: 1 Ice pack (please put in freezer the night before surgery)   1 Hibiclens skin cleaner     Pre-op instructions  If you have any questions regarding the instructions, please do not hesitate to call our office.  [place]: [address], [place], [place] [address] -- [phone]  [place]: [address] [place], [place] [address] -- [phone]  [place]: [address]  [place], [place] [address] -- [phone]  [place]: [address], [address], [place], [place] [address] -- [phone]  Website:  [email or web address] 

## 2017-07-01 NOTE — Telephone Encounter (Signed)
[facility] staff[facility] called to see if they were to fill the clindamycin and cipro. Left message informing [facility] staff pt was to take the clindamycin and cipro, but at different times of day.

## 2017-07-08 ENCOUNTER — Telehealth: Payer: Self-pay | Admitting: *Deleted

## 2017-07-08 NOTE — Telephone Encounter (Signed)
"  We're canceling [name]'s surgery.  She said she's having chest pains."  I will let Dr. March Rummage know.  I called the patient to see how she was doing.  She said she has not gone to the doctor yet.  She said she has an appointment on Tuesday of next week.  She said she's trying to wait until then.  I told her she needed to have it checked as soon as possible and that she should not wait until Tuesday.  She said if it got worse she would go to the emergency room.

## 2017-07-08 NOTE — Telephone Encounter (Signed)
Post op visits have been cancelled.

## 2017-07-12 ENCOUNTER — Telehealth: Payer: Self-pay | Admitting: Podiatry

## 2017-07-12 MED ORDER — CIPROFLOXACIN HCL 500 MG PO TABS: 500.0000 mg | ORAL_TABLET | Freq: Two times a day (BID) | ORAL | 0 refills | Status: DC

## 2017-07-12 MED ORDER — CLINDAMYCIN HCL 150 MG PO CAPS
150.0000 mg | ORAL_CAPSULE | Freq: Two times a day (BID) | ORAL | 0 refills | Status: DC
Start: 2017-07-12 — End: 2017-07-28

## 2017-07-12 NOTE — Telephone Encounter (Signed)
Patient called to see if she can get a refill on her antibiotics- her feet are still hurting her and bleeding as well. If you can call patient back at [phone]

## 2017-07-12 NOTE — Telephone Encounter (Signed)
I spoke with pt, informed I would need to have her come in today to see another doctor, because Dr. March Rummage was in [place]. Pt states she doesn't have transportation. I told pt Dr.Price had wanted me to refill her antibiotic and if she could get a ride to come in tomorrow, but if that if she worsened, ran a fever, increased pain, redness, streaking, and drainage she would need to go to the ED. I transferred pt to scheduler.

## 2017-07-12 NOTE — Telephone Encounter (Signed)
[name] - can we get her in to see me Thursday? Val - can we refill her Abx?

## 2017-07-12 NOTE — Addendum Note (Signed)
Addended by: Harriett Sine D on: 07/12/2017 09:23 AM   Modules accepted: Orders

## 2017-07-15 ENCOUNTER — Ambulatory Visit (INDEPENDENT_AMBULATORY_CARE_PROVIDER_SITE_OTHER): Payer: Medicare Other | Admitting: Podiatry

## 2017-07-15 ENCOUNTER — Telehealth: Payer: Self-pay | Admitting: *Deleted

## 2017-07-15 ENCOUNTER — Other Ambulatory Visit: Payer: Medicare Other | Admitting: Podiatry

## 2017-07-15 DIAGNOSIS — M21961 Unspecified acquired deformity of right lower leg: Secondary | ICD-10-CM

## 2017-07-15 DIAGNOSIS — E08621 Diabetes mellitus due to underlying condition with foot ulcer: Secondary | ICD-10-CM

## 2017-07-15 DIAGNOSIS — M779 Enthesopathy, unspecified: Secondary | ICD-10-CM | POA: Diagnosis not present

## 2017-07-15 DIAGNOSIS — L97411 Non-pressure chronic ulcer of right heel and midfoot limited to breakdown of skin: Secondary | ICD-10-CM | POA: Diagnosis not present

## 2017-07-15 NOTE — Telephone Encounter (Signed)
"  Can you send me the medical clearance from her primary care doctor in order for her to have the procedure.  The nurse said she assumes you have medical clearance since you put her back on."  I sent the clinical note from patient's visit with Dr. Lind Guest, Otolaryngology on Tuesday, May 21.  I don't know if it will be suffice.

## 2017-07-15 NOTE — Progress Notes (Signed)
  Subjective:  Patient ID: [name], female    DOB: 1950/12/19,  MRN: [id number]  Chief Complaint  Patient presents with  . Diabetic Ulcer    right foot very painful -concerned for infection   67 y.o. female returns for the above complaint.  States that she was unable to have surgery last week due to chest pains.  Concerned for infection in the foot since it is very painful.  Denies nausea vomiting fever chills been taking antibiotics that were prescribed  Objective:  There were no vitals filed for this visit. General AA&O x3. Normal mood and affect.  Vascular Pedal pulses palpable.  Neurologic Epicritic sensation grossly diminished.  Protective sensation absent  Dermatologic  Ulcer to the right fifth metatarsal head measuring1x1 post-debridement.  Wound base granular with hyperkeratotic periwound.  No undermining.  No ascending cellulitis.  No warmth or erythema.   HP K first MPJ right.  First and fifth MPJ left  Orthopedic: POP R 5th metatarsal head.   Assessment & Plan:  Patient was evaluated and treated and all questions answered.  Ulcer right fifth metatarsal head, -Debridement as below. -Dressed with iodosorb and DSD -Discussed continued need for metatarsal head excision we will plan for the procedure next week.  Procedure: Excisional Debridement of Wound Rationale: Removal of non-viable soft tissue from the wound to promote healing.  Anesthesia: none Pre-Debridement Wound Measurements: 0.5x0.5 Post-Debridement Wound Measurements: 1x1 Type of Debridement: Excisional Tissue Removed: Non-viable soft tissue Depth of Debridement: subq Instrumentation: 312 blade Technique: Sharp excisional debridement to bleeding, viable wound base.  Dressing: Dry, sterile, compression dressing. Disposition: Patient tolerated procedure well. Patient to return in 1 week for follow-up.  No follow-ups on file.

## 2017-07-19 ENCOUNTER — Emergency Department (HOSPITAL_COMMUNITY)
Admission: EM | Admit: 2017-07-19 | Discharge: 2017-07-20 | Disposition: A | Discharge status: Home / Self Care | Payer: Medicare Other | Attending: Emergency Medicine | Admitting: Emergency Medicine

## 2017-07-19 ENCOUNTER — Encounter (HOSPITAL_COMMUNITY): Payer: Self-pay | Admitting: Emergency Medicine

## 2017-07-19 DIAGNOSIS — R42 Dizziness and giddiness: Secondary | ICD-10-CM | POA: Insufficient documentation

## 2017-07-19 DIAGNOSIS — Z7984 Long term (current) use of oral hypoglycemic drugs: Secondary | ICD-10-CM | POA: Insufficient documentation

## 2017-07-19 DIAGNOSIS — N189 Chronic kidney disease, unspecified: Secondary | ICD-10-CM

## 2017-07-19 DIAGNOSIS — I130 Hypertensive heart and chronic kidney disease with heart failure and stage 1 through stage 4 chronic kidney disease, or unspecified chronic kidney disease: Secondary | ICD-10-CM | POA: Insufficient documentation

## 2017-07-19 DIAGNOSIS — N289 Disorder of kidney and ureter, unspecified: Secondary | ICD-10-CM

## 2017-07-19 DIAGNOSIS — H81399 Other peripheral vertigo, unspecified ear: Secondary | ICD-10-CM

## 2017-07-19 DIAGNOSIS — I5030 Unspecified diastolic (congestive) heart failure: Secondary | ICD-10-CM | POA: Diagnosis not present

## 2017-07-19 DIAGNOSIS — Z87891 Personal history of nicotine dependence: Secondary | ICD-10-CM | POA: Diagnosis not present

## 2017-07-19 DIAGNOSIS — Z7982 Long term (current) use of aspirin: Secondary | ICD-10-CM | POA: Insufficient documentation

## 2017-07-19 DIAGNOSIS — D649 Anemia, unspecified: Secondary | ICD-10-CM | POA: Insufficient documentation

## 2017-07-19 DIAGNOSIS — K521 Toxic gastroenteritis and colitis: Secondary | ICD-10-CM

## 2017-07-19 DIAGNOSIS — E1122 Type 2 diabetes mellitus with diabetic chronic kidney disease: Secondary | ICD-10-CM | POA: Insufficient documentation

## 2017-07-19 DIAGNOSIS — T3695XA Adverse effect of unspecified systemic antibiotic, initial encounter: Secondary | ICD-10-CM | POA: Diagnosis not present

## 2017-07-19 HISTORY — DX: Unspecified cirrhosis of liver: K74.60

## 2017-07-19 LAB — CBC
HCT: 31.2 % — ABNORMAL LOW (ref 36.0–46.0)
Hemoglobin: 10.2 g/dL — ABNORMAL LOW (ref 12.0–15.0)
MCH: 28.3 pg (ref 26.0–34.0)
MCHC: 32.7 g/dL (ref 30.0–36.0)
MCV: 86.4 fL (ref 78.0–100.0)
PLATELETS: 118 10*3/uL — AB (ref 150–400)
RBC: 3.61 MIL/uL — ABNORMAL LOW (ref 3.87–5.11)
RDW: 14.9 % (ref 11.5–15.5)
WBC: 2.5 10*3/uL — ABNORMAL LOW (ref 4.0–10.5)

## 2017-07-19 LAB — CBG MONITORING, ED: Glucose-Capillary: 266 mg/dL — ABNORMAL HIGH (ref 65–99)

## 2017-07-19 LAB — I-STAT TROPONIN, ED: Troponin i, poc: 0 ng/mL (ref 0.00–0.08)

## 2017-07-19 LAB — BASIC METABOLIC PANEL
Anion gap: 6 (ref 5–15)
BUN: 25 mg/dL — AB (ref 6–20)
CO2: 22 mmol/L (ref 22–32)
CREATININE: 1.63 mg/dL — AB (ref 0.44–1.00)
Calcium: 8.5 mg/dL — ABNORMAL LOW (ref 8.9–10.3)
Chloride: 105 mmol/L (ref 101–111)
GFR calc Af Amer: 37 mL/min — ABNORMAL LOW (ref >60)
GFR, EST NON AFRICAN AMERICAN: 32 mL/min — AB (ref >60)
GLUCOSE: 301 mg/dL — AB (ref 65–99)
Potassium: 4 mmol/L (ref 3.5–5.1)
Sodium: 133 mmol/L — ABNORMAL LOW (ref 135–145)

## 2017-07-19 LAB — URINALYSIS, ROUTINE W REFLEX MICROSCOPIC
BACTERIA UA: NONE SEEN
BILIRUBIN URINE: NEGATIVE
Glucose, UA: 150 mg/dL — AB
KETONES UR: NEGATIVE mg/dL
Leukocytes, UA: NEGATIVE
NITRITE: NEGATIVE
PH: 5 (ref 5.0–8.0)
Protein, ur: 100 mg/dL — AB
SPECIFIC GRAVITY, URINE: 1.012 (ref 1.005–1.030)

## 2017-07-19 NOTE — ED Provider Notes (Signed)
[facility] EMERGENCY DEPARTMENT Provider Note   CSN: [id number] Arrival date & time: 07/19/17  2046     History   Chief Complaint Chief Complaint  Patient presents with  . Weakness    HPI [name] is a 67 y.o. female.  The history is provided by the patient.  She has history of hypertension, diabetes, hyperlipidemia, cirrhosis, diastolic dysfunction and comes in complaining of dizziness during the day today.  Dizziness is like she is drunk.  She has not had any syncopal episodes.  She denies chest pain or heaviness or tightness or pressure.  She denies fever or chills.  There is been no nausea or vomiting.  She is currently on an antibiotic for an infection in her foot, and has been having diarrhea with that.  She states she has a bowel movement about once an hour.  Past Medical History:  Diagnosis Date  . ANEMIA, CHRONIC DISEASE NEC 03/15/2006  . Cirrhosis ([place])   . Colon polyps   . Constipation due to opioid therapy 06/05/2014  . DEGENERATIVE JOINT DISEASE 05/06/2007   On going for >5 yrs No imaging to confirm Has tried ultram, mobic, neurontin which did not work Ambulance person tried once worked well    . Diabetes mellitus    Type II  . DIABETIC  RETINOPATHY 03/15/2006  . 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 Diastolic dysfunction 79/03/4095   Grade 1 by Echocardiogram 12/13/14  . Heart murmur   . Hemorrhoids   . History of kidney stones   . Hyperlipidemia 04/01/2011  . Hypertension   . Moderate aortic regurgitation 09/25/2008   12/13/14 Echocardiogram    . PANCREATITIS, CHRONIC 03/29/2006  . Peripheral arterial disease ([place])   . Pneumonia    age 37-20  . RENAL INSUFFICIENCY, CHRONIC 03/15/2006  . Tonsillar mass    Left    Patient Active Problem List   Diagnosis Date Noted  . Tubular adenoma of colon 06/30/2016  . Diverticulosis of colon without hemorrhage 06/30/2016  . Postmenopausal vaginal bleeding   . Long term (current) use of opiate  analgesic 04/09/2016  . Bilateral carotid artery disease ([place]) 11/28/2015  . Pancreatic insufficiency 05/09/2015  . 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 Risk for coronary artery disease greater than 20% in next 10 years 03/14/2015  . Diastolic dysfunction 35/32/9924  . Vulvar cyst 01/23/2015  . Abnormal uterine bleeding 12/02/2014  . Unexplained night sweats 12/02/2014  . Constipation due to opioid therapy 06/05/2014  . Loss of weight 03/29/2014  . Peripheral arterial disease ([place]) 12/12/2013  . GERD (gastroesophageal reflux disease) 10/12/2012  . Healthcare maintenance 09/29/2012  . Back pain 03/28/2012  . Hyperlipidemia 04/01/2011  . BOILS, RECURRENT 03/13/2010  . Moderate aortic regurgitation 09/25/2008  . HEMORRHOIDS 06/08/2008  . Osteoarthritis 05/06/2007  . TOBACCO ABUSE 09/27/2006  . History of osteoporosis 06/29/2006  . Chronic pancreatitis ([place]) 03/29/2006  . Uncontrolled type 2 diabetes mellitus with chronic kidney disease, without long-term current use of insulin ([place]) 03/15/2006  . Diabetic neuropathy, painful ([place]) 03/15/2006  . Anemia of chronic disease 03/15/2006  . Essential hypertension 03/15/2006  . VENTRICULAR HYPERTROPHY, LEFT 03/15/2006  . CKD stage 3 due to type 2 diabetes mellitus ([place]) 03/15/2006    Past Surgical History:  Procedure Laterality Date  . BREAST SURGERY    . COLONOSCOPY    . DILATION AND CURETTAGE OF UTERUS N/A 06/16/2016   Procedure: DILATATION AND CURETTAGE;  Surgeon: Mora Bellman, MD;  Location: [facility];  Service: Gynecology;  Laterality: N/A;  . HYSTEROSCOPY N/A 06/16/2016   Procedure: HYSTEROSCOPY;  Surgeon: Mora Bellman, MD;  Location: [facility];  Service: Gynecology;  Laterality: N/A;  . MOUTH SURGERY N/A   . POLYPECTOMY N/A 06/16/2016   Procedure: POLYPECTOMY;  Surgeon: Mora Bellman, MD;  Location: [facility];  Service: Gynecology;  Laterality: N/A;     OB History    Gravida  3   Para  3   Term  3   Preterm      AB       Living  3     SAB      TAB      Ectopic      Multiple      Live Births  3            Home Medications    Prior to Admission medications   Medication Sig Start Date End Date Taking? Authorizing Provider  ACCU-CHEK AVIVA PLUS test strip TEST three times a day 11/25/15   Lucious Groves, DO  amLODipine (NORVASC) 10 MG tablet Take 10 mg by mouth daily. 06/01/17   [provider]  aspirin EC 81 MG tablet Take 1 tablet (81 mg total) by mouth daily. 04/09/16   Lucious Groves, DO  atorvastatin (LIPITOR) 40 MG tablet Take 1 tablet (40 mg total) by mouth daily. 03/25/16 06/08/18  Lucious Groves, DO  bisacodyl (DULCOLAX) 5 MG EC tablet Take 5-10 mg by mouth daily as needed for moderate constipation.    [provider]  Blood Glucose Monitoring Suppl (ACCU-CHEK AVIVA PLUS) w/Device KIT Use to check you blood sugar 3 times a day 06/04/15   Oval Linsey, MD  ciprofloxacin (CIPRO) 500 MG tablet Take 1 tablet (500 mg total) by mouth 2 (two) times daily. 07/12/17   Evelina Bucy, DPM  clindamycin (CLEOCIN) 150 MG capsule Take 1 capsule (150 mg total) by mouth 2 (two) times daily. 07/12/17   Evelina Bucy, DPM  diclofenac sodium (VOLTAREN) 1 % GEL Apply 4 g topically 4 (four) times daily as needed for pain. 06/01/17   [provider]  docusate sodium (COLACE) 100 MG capsule Take 100 mg by mouth 2 (two) times daily as needed (for constipation.).    [provider]  LANTUS SOLOSTAR 100 UNIT/ML Solostar Pen Inject 10 Units into the skin daily.  05/04/17   [provider]  LINZESS 145 MCG CAPS capsule Take 145 mg by mouth daily. 03/07/17   [provider]  loperamide (IMODIUM) 2 MG capsule Take 1 capsule (2 mg total) 4 (four) times daily as needed by mouth for diarrhea or loose stools. Patient not taking: Reported on 5/37/4827 08/30/65   Delora Fuel, MD  Multiple Vitamin (MULTIVITAMIN WITH MINERALS) TABS tablet Take 1 tablet by mouth daily.  One-a-Day Women's 50+    [provider]  Pancrelipase, Lip-Prot-Amyl, 24000-76000 units CPEP Take 4 capsules (96,000 Units total) by mouth See admin instructions. Take 4 capsules by mouth 2-3 times daily before meals Patient taking differently: Take 72,000-96,000 Units by mouth 3 (three) times daily before meals.  03/10/16   Lucious Groves, DO  PROAIR HFA 108 [phone] Base) MCG/ACT inhaler Inhale 2 puffs into the lungs 4 (four) times daily as needed for wheezing or shortness of breath. 04/30/17   [provider]  silver sulfADIAZINE (SILVADENE) 1 % cream Apply topically daily. Patient taking differently: Apply 1 application topically daily. Apply topically daily to foot. 04/30/17  Evelina Bucy, DPM  sitaGLIPtin (JANUVIA) 50 MG tablet Take 1 tablet (50 mg total) by mouth daily. 09/10/16   Lucious Groves, DO  tiZANidine (ZANAFLEX) 4 MG tablet Take 4 mg by mouth 2 (two) times daily as needed. For neck/low back pain. 05/18/17   [provider]  traMADol (ULTRAM) 50 MG tablet Take 50 mg by mouth every 6 (six) hours as needed (for pain.).     [provider]    Family History Family History  Problem Relation Age of Onset  . Dementia Mother   . Colon cancer Neg Hx     Social History Social History   Tobacco Use  . Smoking status: Former Smoker    Packs/day: 0.50    Years: 44.00    Pack years: 22.00    Types: Cigarettes    Last attempt to quit: 01/2017    Years since quitting: 0.4  . Smokeless tobacco: Never Used  . Tobacco comment: tobacco info given 11/26/16  Substance Use Topics  . Alcohol use: Yes    Alcohol/week: 0.0 oz    Comment: occ  . Drug use: No     Allergies   Gabapentin and Nicorette [nicotine]   Review of Systems Review of Systems  All other systems reviewed and are negative.    Physical Exam Updated Vital Signs BP (!) 161/63 (BP Location: Right Arm)   Pulse (!) 51   Temp 97.9 F (36.6 C) (Oral)   Resp 16   Ht _0  (1.651  m)   Wt 60.3 kg (133 lb)   LMP 02/22/2016   SpO2 99%   BMI 22.13 kg/m   Physical Exam  Nursing note and vitals reviewed.  67 year old female, resting comfortably and in no acute distress. Vital signs are significant for elevated systolic blood pressure and for mild bradycardia. Oxygen saturation is 99%, which is normal. Head is normocephalic and atraumatic. PERRLA, EOMI. Oropharynx is clear. Neck is nontender and supple without adenopathy or JVD. Back is nontender and there is no CVA tenderness. Lungs are clear without rales, wheezes, or rhonchi. Chest is nontender. Heart has regular rate and rhythm without murmur. Abdomen is soft, flat, nontender without masses or hepatosplenomegaly and peristalsis is normoactive. Extremities have no cyanosis or edema, full range of motion is present.  Right foot is not examined, but is site of known diabetic ulcer. Skin is warm and dry without rash. Neurologic: Mental status is normal, cranial nerves are intact, there are no motor or sensory deficits.  No nystagmus noted.  Dizziness is reproduced by passive head movement.  ED Treatments / Results  Labs (all labs ordered are listed, but only abnormal results are displayed) Labs Reviewed  BASIC METABOLIC PANEL - Abnormal; Notable for the following components:      Result Value   Sodium 133 (*)    Glucose, Bld 301 (*)    BUN 25 (*)    Creatinine, Ser 1.63 (*)    Calcium 8.5 (*)    GFR calc non Af Amer 32 (*)    GFR calc Af Amer 37 (*)    All other components within normal limits  CBC - Abnormal; Notable for the following components:   WBC 2.5 (*)    RBC 3.61 (*)    Hemoglobin 10.2 (*)    HCT 31.2 (*)    Platelets 118 (*)    All other components within normal limits  URINALYSIS, ROUTINE W REFLEX MICROSCOPIC - Abnormal; Notable for the  following components:   Glucose, UA 150 (*)    Hgb urine dipstick SMALL (*)    Protein, ur 100 (*)    All other components within normal limits  CBG  MONITORING, ED - Abnormal; Notable for the following components:   Glucose-Capillary 266 (*)    All other components within normal limits  CBG MONITORING, ED  I-STAT TROPONIN, ED    EKG EKG Interpretation  Date/Time:  Monday Jul 19 2017 21:01:15 EDT Ventricular Rate:  64 PR Interval:  164 QRS Duration: 110 QT Interval:  446 QTC Calculation: 460 R Axis:   -24 Text Interpretation:  duplicate, discard Confirmed by Delora Fuel ([id number]) on 07/19/2017 11:56:27 PM  Procedures Procedures  Medications Ordered in ED Medications  sodium chloride 0.9 % bolus 1,000 mL (0 mLs Intravenous Stopped 07/20/17 0210)  meclizine (ANTIVERT) tablet 25 mg (25 mg Oral Given 07/20/17 0008)     Initial Impression / Assessment and Plan / ED Course  I have reviewed the triage vital signs and the nursing notes.  Pertinent lab results that were available during my care of the patient were reviewed by me and considered in my medical decision making (see chart for details).  Dizziness whi ch seems to be peripheral vertigo.  Laboratory evaluation shows pancytopenia not significantly changed from recent values.  Creatinine is somewhat increased from baseline, possibly secondary to dehydration-even though BUN is actually decreased from baseline.  Mild hyponatremia is noted and not felt to be clinically significant, possibly related to diarrhea.  We will check orthostatic vital signs and will give therapeutic trial of meclizine.  We will also give a liter of saline.  Old records are reviewed confirming management of diabetic foot ulcer by podiatry.  Orthostatic vital signs showed no significant change in pulse or blood pressure.  She feels much better after above-noted treatment.  She is discharged with prescription for meclizine, ~use over-the-counter loperamide as needed for diarrhea.  Recommended she follow-up with PCP in 1 week to recheck creatinine.  Final Clinical Impressions(s) / ED Diagnoses   Final  diagnoses:  Peripheral vertigo, unspecified laterality  Antibiotic-associated diarrhea  Acute on chronic renal insufficiency  Normochromic normocytic anemia    ED Discharge Orders        Ordered    meclizine (ANTIVERT) 25 MG tablet  3 times daily PRN     92/01/00 7121       Delora Fuel, MD 97/58/83 0221

## 2017-07-19 NOTE — ED Notes (Signed)
Checked CBG 266, RN Autumn informed

## 2017-07-19 NOTE — ED Triage Notes (Addendum)
Pt c/o generalized weakness onset today a little after noon. BS 150 at home earlier. Pt c/o dizziness, off balance, pt is currently on abtx infection in R foot, she states she is due to have surgery on same. +diarrhea since taking antbx. +bilat blurred vision, unresolved with eye drops. Pt c/o mid to low chronic back pain. Pt states she became shob while speaking with grandchild earlier today.

## 2017-07-20 DIAGNOSIS — R42 Dizziness and giddiness: Secondary | ICD-10-CM | POA: Diagnosis not present

## 2017-07-20 MED ORDER — MECLIZINE HCL 25 MG PO TABS: 25.0000 mg | ORAL_TABLET | Freq: Three times a day (TID) | ORAL | 0 refills | Status: DC | PRN

## 2017-07-20 MED ORDER — SODIUM CHLORIDE 0.9 % IV BOLUS
1000.0000 mL | Freq: Once | INTRAVENOUS | Status: AC
  Administered 2017-07-20: 1000 mL via INTRAVENOUS

## 2017-07-20 MED ORDER — MECLIZINE HCL 25 MG PO TABS
25.0000 mg | ORAL_TABLET | Freq: Once | ORAL | Status: AC
  Administered 2017-07-20: 25 mg via ORAL
  Filled 2017-07-20: qty 1

## 2017-07-20 NOTE — Telephone Encounter (Signed)
"  We have concerns.  [name] had complained about chest pains previously.  The notes you sent from the Otolaryngologist cannot be used for clearance.  What do you want to do?"  Go ahead and cancel the case.  She has not seen her primary care doctor nor a Cardiologist.  "Okay, we'll take her off the schedule.  I called [name] about her surgery.  She said nobody called her nor told her a date.  I told her I gave her a date when she was here.  I reminded her that she said she was going to check with her daughter to see if she could take her.  She stated she found out her daughter was in the hospital the same day she was here.  She said she went to the emergency room yesterday.  She said she has something going on with her kidneys.  I told her I had canceled the surgery because the nurse at the surgical center said you will have to get medical clearance from your doctor prior to having surgery since you were complaining of chest pains.  She said she has been trying to get in contact with Dr. Jeanie Cooks but hasn't been able to reach anyone.  She said she will continue to try and reach him.

## 2017-07-20 NOTE — Discharge Instructions (Signed)
Take loperamide (Imodium A-D) as needed for diarrhea. ?

## 2017-07-23 ENCOUNTER — Other Ambulatory Visit: Payer: Medicare Other

## 2017-07-28 ENCOUNTER — Telehealth: Payer: Self-pay | Admitting: *Deleted

## 2017-07-28 MED ORDER — CIPROFLOXACIN HCL 500 MG PO TABS: 500.0000 mg | ORAL_TABLET | Freq: Two times a day (BID) | ORAL | 0 refills | Status: DC

## 2017-07-28 MED ORDER — CLINDAMYCIN HCL 150 MG PO CAPS: 150.0000 mg | ORAL_CAPSULE | Freq: Two times a day (BID) | ORAL | 0 refills | Status: DC

## 2017-07-28 NOTE — Telephone Encounter (Signed)
Pt states her feets is killing her and she would like to know if Dr. March Rummage would prescribe a pain medication or an antibiotic. I asked pt if she had any other symptoms and she denies redness, swelling or drainage just pain on the left foot. I told pt I would send message to Dr. March Rummage and call again. I told pt she needed to get an appt and she states she should have taken the one for Thursday. I told pt would transfer her to schedulers and we would put her on the cancellation schedule. Transferred pt to scheduler.

## 2017-07-28 NOTE — Telephone Encounter (Signed)
Pt states her feet are killing her and she wants a refill of the antibiotics. I refilled the cipro and clindamycin and told pt I would call with further instructions tomorrow.

## 2017-07-29 ENCOUNTER — Encounter (HOSPITAL_COMMUNITY): Payer: Self-pay | Admitting: Emergency Medicine

## 2017-07-29 ENCOUNTER — Emergency Department (HOSPITAL_COMMUNITY)
Admission: EM | Admit: 2017-07-29 | Discharge: 2017-07-30 | Disposition: A | Discharge status: Home / Self Care | Payer: Medicare Other | Attending: Emergency Medicine | Admitting: Emergency Medicine

## 2017-07-29 ENCOUNTER — Other Ambulatory Visit: Payer: Self-pay

## 2017-07-29 DIAGNOSIS — Z79899 Other long term (current) drug therapy: Secondary | ICD-10-CM | POA: Diagnosis not present

## 2017-07-29 DIAGNOSIS — M546 Pain in thoracic spine: Secondary | ICD-10-CM | POA: Insufficient documentation

## 2017-07-29 DIAGNOSIS — I129 Hypertensive chronic kidney disease with stage 1 through stage 4 chronic kidney disease, or unspecified chronic kidney disease: Secondary | ICD-10-CM | POA: Diagnosis not present

## 2017-07-29 DIAGNOSIS — E119 Type 2 diabetes mellitus without complications: Secondary | ICD-10-CM | POA: Diagnosis not present

## 2017-07-29 DIAGNOSIS — Z87891 Personal history of nicotine dependence: Secondary | ICD-10-CM | POA: Diagnosis not present

## 2017-07-29 DIAGNOSIS — Z7982 Long term (current) use of aspirin: Secondary | ICD-10-CM | POA: Insufficient documentation

## 2017-07-29 DIAGNOSIS — Z794 Long term (current) use of insulin: Secondary | ICD-10-CM | POA: Diagnosis not present

## 2017-07-29 DIAGNOSIS — N183 Chronic kidney disease, stage 3 (moderate): Secondary | ICD-10-CM | POA: Insufficient documentation

## 2017-07-29 DIAGNOSIS — M549 Dorsalgia, unspecified: Secondary | ICD-10-CM | POA: Diagnosis present

## 2017-07-29 LAB — CBC
HEMATOCRIT: 32.7 % — AB (ref 36.0–46.0)
HEMOGLOBIN: 10.7 g/dL — AB (ref 12.0–15.0)
MCH: 28.2 pg (ref 26.0–34.0)
MCHC: 32.7 g/dL (ref 30.0–36.0)
MCV: 86.1 fL (ref 78.0–100.0)
Platelets: 117 10*3/uL — ABNORMAL LOW (ref 150–400)
RBC: 3.8 MIL/uL — ABNORMAL LOW (ref 3.87–5.11)
RDW: 14.8 % (ref 11.5–15.5)
WBC: 5 10*3/uL (ref 4.0–10.5)

## 2017-07-29 LAB — BASIC METABOLIC PANEL
ANION GAP: 8 (ref 5–15)
BUN: 31 mg/dL — ABNORMAL HIGH (ref 6–20)
CHLORIDE: 108 mmol/L (ref 101–111)
CO2: 20 mmol/L — AB (ref 22–32)
Calcium: 8.6 mg/dL — ABNORMAL LOW (ref 8.9–10.3)
Creatinine, Ser: 1.39 mg/dL — ABNORMAL HIGH (ref 0.44–1.00)
GFR calc non Af Amer: 39 mL/min — ABNORMAL LOW (ref >60)
GFR, EST AFRICAN AMERICAN: 45 mL/min — AB (ref >60)
GLUCOSE: 184 mg/dL — AB (ref 65–99)
Potassium: 4.6 mmol/L (ref 3.5–5.1)
Sodium: 136 mmol/L (ref 135–145)

## 2017-07-29 LAB — CBG MONITORING, ED: Glucose-Capillary: 175 mg/dL — ABNORMAL HIGH (ref 65–99)

## 2017-07-29 MED ORDER — TRAMADOL HCL 50 MG PO TABS: ORAL_TABLET | ORAL | 0 refills | Status: DC

## 2017-07-29 NOTE — ED Triage Notes (Signed)
Pt presents to ED for assessment of mid to upper back pain, radiating to neck, and hypoglycemia.  Pt states she has been having difficulty managing her sugars at home and got a reading of 60 prior to arrival.  Patient denies known injury to her back, states she woke up to it this morning.  CBG 175 in triage, pt states she has been eating cookies and drinking a full-calorie soda while waiting

## 2017-07-29 NOTE — Telephone Encounter (Signed)
Dr. March Rummage ordered tramadol 50mg  #21 one tablet every 8 hours prn foot pain. Orders called to pt and [place].

## 2017-07-29 NOTE — ED Notes (Signed)
Results reviewed.  No changes in acuity at this time 

## 2017-07-30 ENCOUNTER — Emergency Department (HOSPITAL_COMMUNITY): Payer: Medicare Other

## 2017-07-30 ENCOUNTER — Other Ambulatory Visit: Payer: Self-pay

## 2017-07-30 DIAGNOSIS — M546 Pain in thoracic spine: Secondary | ICD-10-CM | POA: Diagnosis not present

## 2017-07-30 LAB — URINALYSIS, ROUTINE W REFLEX MICROSCOPIC
BACTERIA UA: NONE SEEN
Bilirubin Urine: NEGATIVE
Glucose, UA: NEGATIVE mg/dL
KETONES UR: NEGATIVE mg/dL
NITRITE: NEGATIVE
PH: 5 (ref 5.0–8.0)
Protein, ur: 100 mg/dL — AB
Specific Gravity, Urine: 1.012 (ref 1.005–1.030)

## 2017-07-30 LAB — CBG MONITORING, ED
GLUCOSE-CAPILLARY: 112 mg/dL — AB (ref 65–99)
Glucose-Capillary: 106 mg/dL — ABNORMAL HIGH (ref 65–99)

## 2017-07-30 MED ORDER — LIDOCAINE 5 % EX PTCH
1.0000 | MEDICATED_PATCH | CUTANEOUS | Status: DC
  Administered 2017-07-30: 1 via TRANSDERMAL
  Filled 2017-07-30: qty 1

## 2017-07-30 MED ORDER — OXYCODONE-ACETAMINOPHEN 5-325 MG PO TABS
1.0000 | ORAL_TABLET | Freq: Once | ORAL | Status: AC
  Administered 2017-07-30: 1 via ORAL
  Filled 2017-07-30: qty 1

## 2017-07-30 NOTE — Discharge Instructions (Signed)
Your work-up in the emergency department was reassuring.  We were unable to find a concerning or life-threatening cause of your symptoms.  We suspect that your pain may be due to a muscular cause.  Take Tylenol as needed for management of pain.  While your sugar was reported to be low at home, your sugars in the emergency department were stable.  We recommend that you take your daily prescribed medications.  Do not restart your metformin without speaking with your primary care doctor.  Check your sugar level at least 3 times a day.  Be sure to eat regular meals.  Call your primary care doctor in the morning to schedule follow-up in their office for repeat evaluation.

## 2017-07-30 NOTE — ED Provider Notes (Signed)
[facility] EMERGENCY DEPARTMENT Provider Note   CSN: [id number] Arrival date & time: 07/29/17  1707    History   Chief Complaint Chief Complaint  Patient presents with  . Hypoglycemia  . Back Pain    HPI [name] is a 67 y.o. female.  67 year old female presents to the emergency department for evaluation of back pain.  She reports pain in her mid to right upper back.  This radiates to her neck on occasion.  She has had some slight increase to her discomfort with deep breathing.  Pain is also aggravated with certain movements.  She has not taken any medications for her discomfort.  She has not had any hemoptysis, fever, nausea, vomiting.  She denies any strenuous activity, heavy lifting, falls inciting her symptoms.  Patient also expresses concern about low blood sugar.  She states that her blood sugar read 60 while she was at home.  She was seen to be eating cookies and drinking a full calorie soda in the waiting room.  The patient states that she "only had a candy".  She has been on Lantus and Januvia for many years.  She reports compliance with these medications.  The patient also reports restarting her Metformin for unknown reason.  She denies being advised to do this by her primary doctor.  The history is provided by the patient. No language interpreter was used.  Hypoglycemia  Back Pain      Past Medical History:  Diagnosis Date  . ANEMIA, CHRONIC DISEASE NEC 03/15/2006  . Cirrhosis ([place])   . Colon polyps   . Constipation due to opioid therapy 06/05/2014  . DEGENERATIVE JOINT DISEASE 05/06/2007   On going for >5 yrs No imaging to confirm Has tried ultram, mobic, neurontin which did not work Ambulance person tried once worked well    . Diabetes mellitus    Type II  . DIABETIC  RETINOPATHY 03/15/2006  . 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 Diastolic dysfunction 20/25/4270   Grade 1 by Echocardiogram 12/13/14  . Heart murmur   . Hemorrhoids   . History  of kidney stones   . Hyperlipidemia 04/01/2011  . Hypertension   . Moderate aortic regurgitation 09/25/2008   12/13/14 Echocardiogram    . PANCREATITIS, CHRONIC 03/29/2006  . Peripheral arterial disease ([place])   . Pneumonia    age [age]  . RENAL INSUFFICIENCY, CHRONIC 03/15/2006  . Tonsillar mass    Left    Patient Active Problem List   Diagnosis Date Noted  . Tubular adenoma of colon 06/30/2016  . Diverticulosis of colon without hemorrhage 06/30/2016  . Postmenopausal vaginal bleeding   . Long term (current) use of opiate analgesic 04/09/2016  . Bilateral carotid artery disease ([place]) 11/28/2015  . Pancreatic insufficiency 05/09/2015  . 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 Risk for coronary artery disease greater than 20% in next 10 years 03/14/2015  . Diastolic dysfunction 62/37/6283  . Vulvar cyst 01/23/2015  . Abnormal uterine bleeding 12/02/2014  . Unexplained night sweats 12/02/2014  . Constipation due to opioid therapy 06/05/2014  . Loss of weight 03/29/2014  . Peripheral arterial disease ([place]) 12/12/2013  . GERD (gastroesophageal reflux disease) 10/12/2012  . Healthcare maintenance 09/29/2012  . Back pain 03/28/2012  . Hyperlipidemia 04/01/2011  . BOILS, RECURRENT 03/13/2010  . Moderate aortic regurgitation 09/25/2008  . HEMORRHOIDS 06/08/2008  . Osteoarthritis 05/06/2007  . TOBACCO ABUSE 09/27/2006  . History of osteoporosis 06/29/2006  . Chronic pancreatitis ([place]) 03/29/2006  .  Uncontrolled type 2 diabetes mellitus with chronic kidney disease, without long-term current use of insulin ([place]) 03/15/2006  . Diabetic neuropathy, painful ([place]) 03/15/2006  . Anemia of chronic disease 03/15/2006  . Essential hypertension 03/15/2006  . VENTRICULAR HYPERTROPHY, LEFT 03/15/2006  . CKD stage 3 due to type 2 diabetes mellitus ([place]) 03/15/2006    Past Surgical History:  Procedure Laterality Date  . BREAST SURGERY    . COLONOSCOPY    . DILATION AND CURETTAGE OF  UTERUS N/A 06/16/2016   Procedure: DILATATION AND CURETTAGE;  Surgeon: Mora Bellman, MD;  Location: [facility];  Service: Gynecology;  Laterality: N/A;  . HYSTEROSCOPY N/A 06/16/2016   Procedure: HYSTEROSCOPY;  Surgeon: Mora Bellman, MD;  Location: [facility];  Service: Gynecology;  Laterality: N/A;  . MOUTH SURGERY N/A   . POLYPECTOMY N/A 06/16/2016   Procedure: POLYPECTOMY;  Surgeon: Mora Bellman, MD;  Location: [facility];  Service: Gynecology;  Laterality: N/A;     OB History    Gravida  3   Para  3   Term  3   Preterm      AB      Living  3     SAB      TAB      Ectopic      Multiple      Live Births  3            Home Medications    Prior to Admission medications   Medication Sig Start Date End Date Taking? Authorizing Provider  ACCU-CHEK AVIVA PLUS test strip TEST three times a day 11/25/15  Yes Lucious Groves, DO  amLODipine (NORVASC) 10 MG tablet Take 10 mg by mouth daily. 06/01/17  Yes [provider]  aspirin EC 81 MG tablet Take 1 tablet (81 mg total) by mouth daily. 04/09/16  Yes Lucious Groves, DO  atorvastatin (LIPITOR) 40 MG tablet Take 1 tablet (40 mg total) by mouth daily. 03/25/16 06/08/18 Yes Lucious Groves, DO  bisacodyl (DULCOLAX) 5 MG EC tablet Take 5-10 mg by mouth daily as needed for moderate constipation.   Yes [provider]  Blood Glucose Monitoring Suppl (ACCU-CHEK AVIVA PLUS) w/Device KIT Use to check you blood sugar 3 times a day 06/04/15  Yes Oval Linsey, MD  ciprofloxacin (CIPRO) 500 MG tablet Take 1 tablet (500 mg total) by mouth 2 (two) times daily. 07/28/17  Yes Evelina Bucy, DPM  diclofenac sodium (VOLTAREN) 1 % GEL Apply 4 g topically 4 (four) times daily as needed for pain. 06/01/17  Yes [provider]  docusate sodium (COLACE) 100 MG capsule Take 100 mg by mouth 2 (two) times daily as needed (for constipation.).   Yes [provider]  LANTUS SOLOSTAR 100 UNIT/ML Solostar Pen Inject 12 Units into  the skin daily.  05/04/17  Yes [provider]  LINZESS 145 MCG CAPS capsule Take 145 mg by mouth daily. 03/07/17  Yes [provider]  loperamide (IMODIUM) 2 MG capsule Take 1 capsule (2 mg total) 4 (four) times daily as needed by mouth for diarrhea or loose stools. 10/26/24  Yes Delora Fuel, MD  meclizine (ANTIVERT) 25 MG tablet Take 1 tablet (25 mg total) by mouth 3 (three) times daily as needed for dizziness. 09/04/43  Yes Delora Fuel, MD  Multiple Vitamin (MULTIVITAMIN WITH MINERALS) TABS tablet Take 1 tablet by mouth daily. One-a-Day Women's 50+   Yes [provider]  Pancrelipase, Lip-Prot-Amyl, 24000-76000 units CPEP Take 4 capsules (  96,000 Units total) by mouth See admin instructions. Take 4 capsules by mouth 2-3 times daily before meals Patient taking differently: Take 72,000-96,000 Units by mouth 3 (three) times daily before meals.  03/10/16  Yes Lucious Groves, DO  PROAIR HFA 108 (90 Base) MCG/ACT inhaler Inhale 2 puffs into the lungs 4 (four) times daily as needed for wheezing or shortness of breath. 04/30/17  Yes [provider]  silver sulfADIAZINE (SILVADENE) 1 % cream Apply topically daily. Patient taking differently: Apply 1 application topically daily. Apply topically daily to foot. 04/30/17  Yes Evelina Bucy, DPM  sitaGLIPtin (JANUVIA) 50 MG tablet Take 1 tablet (50 mg total) by mouth daily. 09/10/16  Yes Lucious Groves, DO  tiZANidine (ZANAFLEX) 4 MG tablet Take 4 mg by mouth 2 (two) times daily as needed. For neck/low back pain. 05/18/17  Yes [provider]  traMADol (ULTRAM) 50 MG tablet Take 50 mg by mouth every 6 (six) hours as needed (for pain.).    Yes [provider]  traMADol (ULTRAM) 50 MG tablet Take one tablet every 8 hours prn foot pain. 07/29/17  Yes Evelina Bucy, DPM  clindamycin (CLEOCIN) 150 MG capsule Take 1 capsule (150 mg total) by mouth 2 (two) times daily. Patient not taking: Reported on 07/29/2017 07/28/17    Evelina Bucy, DPM    Family History Family History  Problem Relation Age of Onset  . Dementia Mother   . Colon cancer Neg Hx     Social History Social History   Tobacco Use  . Smoking status: Former Smoker    Packs/day: 0.50    Years: 44.00    Pack years: 22.00    Types: Cigarettes    Last attempt to quit: 01/2017    Years since quitting: 0.5  . Smokeless tobacco: Never Used  . Tobacco comment: tobacco info given 11/26/16  Substance Use Topics  . Alcohol use: Yes    Alcohol/week: 0.0 oz    Comment: occ  . Drug use: No     Allergies   Gabapentin and Nicorette [nicotine]   Review of Systems Review of Systems  Musculoskeletal: Positive for back pain.  Ten systems reviewed and are negative for acute change, except as noted in the HPI.    Physical Exam Updated Vital Signs BP 140/65 (BP Location: Right Arm)   Pulse (!) 57   Temp 97.7 F (36.5 C) (Oral)   Resp 14   Ht 5' 5"  (1.651 m)   Wt 60.3 kg (133 lb)   LMP 02/22/2016   SpO2 100%   BMI 22.13 kg/m   Physical Exam  Constitutional: She is oriented to person, place, and time. She appears well-developed and well-nourished. No distress.  Nontoxic appearing and in NAD  HENT:  Head: Normocephalic and atraumatic.  Eyes: Conjunctivae and EOM are normal. No scleral icterus.  Neck: Normal range of motion.  Cardiovascular: Normal rate, regular rhythm and intact distal pulses.  Pulmonary/Chest: Effort normal. No stridor. No respiratory distress. She has no wheezes. She has no rales.  Respirations even and unlabored. Lungs CTAB.  Musculoskeletal: Normal range of motion.  No TTP to the cervical or thoracic midline.  No bony deformities, step-offs, crepitus.  No reproducible paraspinal tenderness.  No spasm.  Neurological: She is alert and oriented to person, place, and time. She exhibits normal muscle tone. Coordination normal.  GCS 15. Patient moving all extremities spontaneously. Grip strength 5/5 bilaterally.    Skin: Skin is warm and dry.  No rash noted. She is not diaphoretic. No erythema. No pallor.  Psychiatric: She has a normal mood and affect. Her behavior is normal.  Nursing note and vitals reviewed.    ED Treatments / Results  Labs (all labs ordered are listed, but only abnormal results are displayed) Labs Reviewed  CBC - Abnormal; Notable for the following components:      Result Value   RBC 3.80 (*)    Hemoglobin 10.7 (*)    HCT 32.7 (*)    Platelets 117 (*)    All other components within normal limits  BASIC METABOLIC PANEL - Abnormal; Notable for the following components:   CO2 20 (*)    Glucose, Bld 184 (*)    BUN 31 (*)    Creatinine, Ser 1.39 (*)    Calcium 8.6 (*)    GFR calc non Af Amer 39 (*)    GFR calc Af Amer 45 (*)    All other components within normal limits  URINALYSIS, ROUTINE W REFLEX MICROSCOPIC - Abnormal; Notable for the following components:   Hgb urine dipstick SMALL (*)    Protein, ur 100 (*)    Leukocytes, UA TRACE (*)    All other components within normal limits  CBG MONITORING, ED - Abnormal; Notable for the following components:   Glucose-Capillary 175 (*)    All other components within normal limits  CBG MONITORING, ED - Abnormal; Notable for the following components:   Glucose-Capillary 112 (*)    All other components within normal limits  CBG MONITORING, ED - Abnormal; Notable for the following components:   Glucose-Capillary 106 (*)    All other components within normal limits    EKG None  Radiology CLINICAL DATA:  Acute onset of upper back pain.  EXAM: CHEST - 2 VIEW  COMPARISON:  Chest radiograph and CTA of the chest performed 03/24/2017  FINDINGS: The lungs are well-aerated and clear. There is no evidence of focal opacification, pleural effusion or pneumothorax.  The heart is normal in size; the mediastinal contour is within normal limits. No acute osseous abnormalities are seen. A subcortical cyst is noted at the  right humeral head.  IMPRESSION: No acute cardiopulmonary process seen.   Electronically Signed   By: Garald Balding M.D.   On: 07/30/2017 00:54  Procedures Procedures (including critical care time)  Medications Ordered in ED Medications  oxyCODONE-acetaminophen (PERCOCET/ROXICET) 5-325 MG per tablet 1 tablet (1 tablet Oral Given 07/30/17 0306)     Initial Impression / Assessment and Plan / ED Course  I have reviewed the triage vital signs and the nursing notes.  Pertinent labs & imaging results that were available during my care of the patient were reviewed by me and considered in my medical decision making (see chart for details).     67 year old female presents to the emergency department for evaluation of thoracic back pain.  Suspect this to be musculoskeletal in etiology as it is aggravated with certain movements.  It is not reproducible on palpation of the back today.  She has no bony deformities, step-offs, crepitus to the thoracic midline.  Patient felt that her symptoms were more "internal".  She had a chest x-ray which did not show any cardiopulmonary process.  No pneumonia, pneumothorax, rib fracture.  Laboratory work-up is stable compared with prior evaluations.  Patient complaining of hypoglycemia prior to arrival; however, this has resolved while in the ED and has not dropped with prolonged observation.  I have advised the patient to  remain on her Lantus and Januvia.  She decided to restart her metformin for an unclear reason.  Patient was told to discontinue this medication until speaking with her primary care doctor.  Return precautions discussed and provided. Patient discharged in stable condition with no unaddressed concerns.   Final Clinical Impressions(s) / ED Diagnoses   Final diagnoses:  Acute thoracic back pain, unspecified back pain laterality    ED Discharge Orders    None       Antonietta Breach, PA-C 08/02/17 [id number]    Veryl Speak, MD 08/03/17  [phone]

## 2017-07-31 ENCOUNTER — Emergency Department (HOSPITAL_COMMUNITY): Payer: Medicare Other

## 2017-07-31 ENCOUNTER — Emergency Department (HOSPITAL_COMMUNITY)
Admission: EM | Admit: 2017-07-31 | Discharge: 2017-07-31 | Disposition: A | Discharge status: Home / Self Care | Payer: Medicare Other | Attending: Emergency Medicine | Admitting: Emergency Medicine

## 2017-07-31 ENCOUNTER — Encounter (HOSPITAL_COMMUNITY): Payer: Self-pay | Admitting: Emergency Medicine

## 2017-07-31 DIAGNOSIS — Z7982 Long term (current) use of aspirin: Secondary | ICD-10-CM | POA: Diagnosis not present

## 2017-07-31 DIAGNOSIS — E1165 Type 2 diabetes mellitus with hyperglycemia: Secondary | ICD-10-CM | POA: Insufficient documentation

## 2017-07-31 DIAGNOSIS — R739 Hyperglycemia, unspecified: Secondary | ICD-10-CM | POA: Diagnosis present

## 2017-07-31 DIAGNOSIS — I10 Essential (primary) hypertension: Secondary | ICD-10-CM | POA: Insufficient documentation

## 2017-07-31 DIAGNOSIS — Z7984 Long term (current) use of oral hypoglycemic drugs: Secondary | ICD-10-CM | POA: Diagnosis not present

## 2017-07-31 DIAGNOSIS — Z79899 Other long term (current) drug therapy: Secondary | ICD-10-CM | POA: Insufficient documentation

## 2017-07-31 DIAGNOSIS — M546 Pain in thoracic spine: Secondary | ICD-10-CM | POA: Diagnosis not present

## 2017-07-31 DIAGNOSIS — Z87891 Personal history of nicotine dependence: Secondary | ICD-10-CM | POA: Insufficient documentation

## 2017-07-31 DIAGNOSIS — M549 Dorsalgia, unspecified: Secondary | ICD-10-CM

## 2017-07-31 LAB — BASIC METABOLIC PANEL
Anion gap: 7 (ref 5–15)
BUN: 26 mg/dL — AB (ref 6–20)
CO2: 21 mmol/L — ABNORMAL LOW (ref 22–32)
CREATININE: 1.47 mg/dL — AB (ref 0.44–1.00)
Calcium: 8.8 mg/dL — ABNORMAL LOW (ref 8.9–10.3)
Chloride: 105 mmol/L (ref 101–111)
GFR calc Af Amer: 42 mL/min — ABNORMAL LOW (ref >60)
GFR, EST NON AFRICAN AMERICAN: 36 mL/min — AB (ref >60)
Glucose, Bld: 259 mg/dL — ABNORMAL HIGH (ref 65–99)
POTASSIUM: 4.2 mmol/L (ref 3.5–5.1)
SODIUM: 133 mmol/L — AB (ref 135–145)

## 2017-07-31 LAB — CBG MONITORING, ED
Glucose-Capillary: 219 mg/dL — ABNORMAL HIGH (ref 65–99)
Glucose-Capillary: 229 mg/dL — ABNORMAL HIGH (ref 65–99)
Glucose-Capillary: 136 mg/dL — ABNORMAL HIGH (ref 65–99)

## 2017-07-31 LAB — CBC
HCT: 28.5 % — ABNORMAL LOW (ref 36.0–46.0)
Hemoglobin: 9.4 g/dL — ABNORMAL LOW (ref 12.0–15.0)
MCH: 28 pg (ref 26.0–34.0)
MCHC: 33 g/dL (ref 30.0–36.0)
MCV: 84.8 fL (ref 78.0–100.0)
Platelets: 119 10*3/uL — ABNORMAL LOW (ref 150–400)
RBC: 3.36 MIL/uL — ABNORMAL LOW (ref 3.87–5.11)
RDW: 14.6 % (ref 11.5–15.5)
WBC: 3.6 10*3/uL — AB (ref 4.0–10.5)

## 2017-07-31 MED ORDER — SODIUM CHLORIDE 0.9 % IV BOLUS
1000.0000 mL | Freq: Once | INTRAVENOUS | Status: AC
  Administered 2017-07-31: 1000 mL via INTRAVENOUS

## 2017-07-31 NOTE — ED Provider Notes (Signed)
[facility] EMERGENCY DEPARTMENT Provider Note   CSN: [id number] Arrival date & time: 07/31/17  1531     History   Chief Complaint Chief Complaint  Patient presents with  . Hyperglycemia    HPI [name] is a 67 y.o. female hx of cirrhosis, anemia, DM, here with hyperglycemia. Patient states that she has been dizzy for the last several weeks. She was seen in the ED about 2 weeks ago and was diagnosed with peripheral vertigo and sent home with meclizine. She then came back 2 days ago for back pain and hypoglycemia. Patient was thought to have MSK pain and she was hyperglycemic that improved in the ED. Patient states that her blood sugar remained elevated around 200 and went up to around 300 this afternoon. She denies vomiting or fevers or abdominal pain. Has persistent back pain but denies any numbness or weakness.   The history is provided by the patient.    Past Medical History:  Diagnosis Date  . ANEMIA, CHRONIC DISEASE NEC 03/15/2006  . Cirrhosis ([place])   . Colon polyps   . Constipation due to opioid therapy 06/05/2014  . DEGENERATIVE JOINT DISEASE 05/06/2007   On going for >5 yrs No imaging to confirm Has tried ultram, mobic, neurontin which did not work Ambulance person tried once worked well    . Diabetes mellitus    Type II  . DIABETIC  RETINOPATHY 03/15/2006  . 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 Diastolic dysfunction 00/34/9179   Grade 1 by Echocardiogram 12/13/14  . Heart murmur   . Hemorrhoids   . History of kidney stones   . Hyperlipidemia 04/01/2011  . Hypertension   . Moderate aortic regurgitation 09/25/2008   12/13/14 Echocardiogram    . PANCREATITIS, CHRONIC 03/29/2006  . Peripheral arterial disease ([place])   . Pneumonia    age 78-21  . RENAL INSUFFICIENCY, CHRONIC 03/15/2006  . Tonsillar mass    Left    Patient Active Problem List   Diagnosis Date Noted  . Tubular adenoma of colon 06/30/2016  . Diverticulosis of colon without hemorrhage  06/30/2016  . Postmenopausal vaginal bleeding   . Long term (current) use of opiate analgesic 04/09/2016  . Bilateral carotid artery disease ([place]) 11/28/2015  . Pancreatic insufficiency 05/09/2015  . 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 Risk for coronary artery disease greater than 20% in next 10 years 03/14/2015  . Diastolic dysfunction 15/06/6977  . Vulvar cyst 01/23/2015  . Abnormal uterine bleeding 12/02/2014  . Unexplained night sweats 12/02/2014  . Constipation due to opioid therapy 06/05/2014  . Loss of weight 03/29/2014  . Peripheral arterial disease ([place]) 12/12/2013  . GERD (gastroesophageal reflux disease) 10/12/2012  . Healthcare maintenance 09/29/2012  . Back pain 03/28/2012  . Hyperlipidemia 04/01/2011  . BOILS, RECURRENT 03/13/2010  . Moderate aortic regurgitation 09/25/2008  . HEMORRHOIDS 06/08/2008  . Osteoarthritis 05/06/2007  . TOBACCO ABUSE 09/27/2006  . History of osteoporosis 06/29/2006  . Chronic pancreatitis ([place]) 03/29/2006  . Uncontrolled type 2 diabetes mellitus with chronic kidney disease, without long-term current use of insulin ([place]) 03/15/2006  . Diabetic neuropathy, painful ([place]) 03/15/2006  . Anemia of chronic disease 03/15/2006  . Essential hypertension 03/15/2006  . VENTRICULAR HYPERTROPHY, LEFT 03/15/2006  . CKD stage 3 due to type 2 diabetes mellitus ([place]) 03/15/2006    Past Surgical History:  Procedure Laterality Date  . BREAST SURGERY    . COLONOSCOPY    . DILATION AND CURETTAGE OF UTERUS N/A 06/16/2016  Procedure: DILATATION AND CURETTAGE;  Surgeon: Mora Bellman, MD;  Location: [facility];  Service: Gynecology;  Laterality: N/A;  . HYSTEROSCOPY N/A 06/16/2016   Procedure: HYSTEROSCOPY;  Surgeon: Mora Bellman, MD;  Location: [facility];  Service: Gynecology;  Laterality: N/A;  . MOUTH SURGERY N/A   . POLYPECTOMY N/A 06/16/2016   Procedure: POLYPECTOMY;  Surgeon: Mora Bellman, MD;  Location: [facility];  Service: Gynecology;   Laterality: N/A;     OB History    Gravida  3   Para  3   Term  3   Preterm      AB      Living  3     SAB      TAB      Ectopic      Multiple      Live Births  3            Home Medications    Prior to Admission medications   Medication Sig Start Date End Date Taking? Authorizing Provider  ACCU-CHEK AVIVA PLUS test strip TEST three times a day 11/25/15   Lucious Groves, DO  amLODipine (NORVASC) 10 MG tablet Take 10 mg by mouth daily. 06/01/17   [provider]  aspirin EC 81 MG tablet Take 1 tablet (81 mg total) by mouth daily. 04/09/16   Lucious Groves, DO  atorvastatin (LIPITOR) 40 MG tablet Take 1 tablet (40 mg total) by mouth daily. 03/25/16 06/08/18  Lucious Groves, DO  bisacodyl (DULCOLAX) 5 MG EC tablet Take 5-10 mg by mouth daily as needed for moderate constipation.    [provider]  Blood Glucose Monitoring Suppl (ACCU-CHEK AVIVA PLUS) w/Device KIT Use to check you blood sugar 3 times a day 06/04/15   Oval Linsey, MD  ciprofloxacin (CIPRO) 500 MG tablet Take 1 tablet (500 mg total) by mouth 2 (two) times daily. 07/28/17   Evelina Bucy, DPM  clindamycin (CLEOCIN) 150 MG capsule Take 1 capsule (150 mg total) by mouth 2 (two) times daily. Patient not taking: Reported on 07/29/2017 07/28/17   Evelina Bucy, DPM  diclofenac sodium (VOLTAREN) 1 % GEL Apply 4 g topically 4 (four) times daily as needed for pain. 06/01/17   [provider]  docusate sodium (COLACE) 100 MG capsule Take 100 mg by mouth 2 (two) times daily as needed (for constipation.).    [provider]  LANTUS SOLOSTAR 100 UNIT/ML Solostar Pen Inject 12 Units into the skin daily.  05/04/17   [provider]  LINZESS 145 MCG CAPS capsule Take 145 mg by mouth daily. 03/07/17   [provider]  loperamide (IMODIUM) 2 MG capsule Take 1 capsule (2 mg total) 4 (four) times daily as needed by mouth for diarrhea or loose stools. 74/8/27   Delora Fuel, MD   meclizine (ANTIVERT) 25 MG tablet Take 1 tablet (25 mg total) by mouth 3 (three) times daily as needed for dizziness. 0/78/67   Delora Fuel, MD  Multiple Vitamin (MULTIVITAMIN WITH MINERALS) TABS tablet Take 1 tablet by mouth daily. One-a-Day Women's 50+    [provider]  Pancrelipase, Lip-Prot-Amyl, 24000-76000 units CPEP Take 4 capsules (96,000 Units total) by mouth See admin instructions. Take 4 capsules by mouth 2-3 times daily before meals Patient taking differently: Take 72,000-96,000 Units by mouth 3 (three) times daily before meals.  03/10/16   Lucious Groves, DO  PROAIR HFA 108 [phone] Base) MCG/ACT inhaler Inhale 2 puffs into the lungs 4 (four)  times daily as needed for wheezing or shortness of breath. 04/30/17   [provider]  silver sulfADIAZINE (SILVADENE) 1 % cream Apply topically daily. Patient taking differently: Apply 1 application topically daily. Apply topically daily to foot. 04/30/17   Evelina Bucy, DPM  sitaGLIPtin (JANUVIA) 50 MG tablet Take 1 tablet (50 mg total) by mouth daily. 09/10/16   Lucious Groves, DO  tiZANidine (ZANAFLEX) 4 MG tablet Take 4 mg by mouth 2 (two) times daily as needed. For neck/low back pain. 05/18/17   [provider]  traMADol (ULTRAM) 50 MG tablet Take 50 mg by mouth every 6 (six) hours as needed (for pain.).     [provider]  traMADol (ULTRAM) 50 MG tablet Take one tablet every 8 hours prn foot pain. 07/29/17   Evelina Bucy, DPM    Family History Family History  Problem Relation Age of Onset  . Dementia Mother   . Colon cancer Neg Hx     Social History Social History   Tobacco Use  . Smoking status: Former Smoker    Packs/day: 0.50    Years: 44.00    Pack years: 22.00    Types: Cigarettes    Last attempt to quit: 01/2017    Years since quitting: 0.5  . Smokeless tobacco: Never Used  . Tobacco comment: tobacco info given 11/26/16  Substance Use Topics  . Alcohol use: Yes    Alcohol/week: 0.0  oz    Comment: occ  . Drug use: No     Allergies   Gabapentin and Nicorette [nicotine]   Review of Systems Review of Systems  Musculoskeletal: Positive for back pain.  All other systems reviewed and are negative.    Physical Exam Updated Vital Signs BP 116/60 (BP Location: Right Arm)   Pulse 60   Temp 98.4 F (36.9 C) (Oral)   Resp 20   LMP 02/22/2016   SpO2 97%   Physical Exam  Constitutional: She is oriented to person, place, and time.  Slightly anxious   HENT:  Head: Normocephalic.  MM slightly dry   Eyes: Pupils are equal, round, and reactive to light. Conjunctivae and EOM are normal.  Neck: Normal range of motion. Neck supple.  Cardiovascular: Normal rate, regular rhythm and normal heart sounds.  Pulmonary/Chest: Effort normal and breath sounds normal. No stridor. No respiratory distress. She has no wheezes.  Abdominal: Soft. Bowel sounds are normal. She exhibits no distension. There is no tenderness. There is no guarding.  Musculoskeletal: Normal range of motion.  Mild mid thoracic tenderness, some scoliosis   Neurological: She is alert and oriented to person, place, and time.  Skin: Skin is warm. Capillary refill takes less than 2 seconds.  Psychiatric: She has a normal mood and affect.  Nursing note and vitals reviewed.    ED Treatments / Results  Labs (all labs ordered are listed, but only abnormal results are displayed) Labs Reviewed  BASIC METABOLIC PANEL - Abnormal; Notable for the following components:      Result Value   Sodium 133 (*)    CO2 21 (*)    Glucose, Bld 259 (*)    BUN 26 (*)    Creatinine, Ser 1.47 (*)    Calcium 8.8 (*)    GFR calc non Af Amer 36 (*)    GFR calc Af Amer 42 (*)    All other components within normal limits  CBC - Abnormal; Notable for the following components:   WBC 3.6 (*)  RBC 3.36 (*)    Hemoglobin 9.4 (*)    HCT 28.5 (*)    Platelets 119 (*)    All other components within normal limits  CBG  MONITORING, ED - Abnormal; Notable for the following components:   Glucose-Capillary 229 (*)    All other components within normal limits  CBG MONITORING, ED - Abnormal; Notable for the following components:   Glucose-Capillary 219 (*)    All other components within normal limits  CBG MONITORING, ED - Abnormal; Notable for the following components:   Glucose-Capillary 136 (*)    All other components within normal limits  URINALYSIS, ROUTINE W REFLEX MICROSCOPIC  CBG MONITORING, ED    EKG None  Radiology Dg Chest 2 View  Result Date: 07/30/2017 CLINICAL DATA:  Acute onset of upper back pain. EXAM: CHEST - 2 VIEW COMPARISON:  Chest radiograph and CTA of the chest performed 03/24/2017 FINDINGS: The lungs are well-aerated and clear. There is no evidence of focal opacification, pleural effusion or pneumothorax. The heart is normal in size; the mediastinal contour is within normal limits. No acute osseous abnormalities are seen. A subcortical cyst is noted at the right humeral head. IMPRESSION: No acute cardiopulmonary process seen. Electronically Signed   By: Garald Balding M.D.   On: 07/30/2017 00:54   Dg Cervical Spine Complete  Result Date: 07/31/2017 CLINICAL DATA:  Generalized neck pain for 2 days. EXAM: CERVICAL SPINE - COMPLETE 4+ VIEW COMPARISON:  None. FINDINGS: There is no evidence of cervical spine fracture or prevertebral soft tissue swelling. Alignment is normal. No other significant bone abnormalities are identified. IMPRESSION: Negative cervical spine radiographs. Electronically Signed   By: Abelardo Diesel M.D.   On: 07/31/2017 21:07   Dg Thoracic Spine 2 View  Result Date: 07/31/2017 CLINICAL DATA:  Generalized mid back pain for a while. EXAM: THORACIC SPINE 2 VIEWS COMPARISON:  None. FINDINGS: There is no evidence of thoracic spine fracture. There is scoliosis of spine. Minimal degenerative joint changes are noted. IMPRESSION: No acute abnormality identified. Electronically Signed    By: Abelardo Diesel M.D.   On: 07/31/2017 21:08    Procedures Procedures (including critical care time)  Medications Ordered in ED Medications  sodium chloride 0.9 % bolus 1,000 mL (1,000 mLs Intravenous Incomplete 07/31/17 2018)     Initial Impression / Assessment and Plan / ED Course  I have reviewed the triage vital signs and the nursing notes.  Pertinent labs & imaging results that were available during my care of the patient were reviewed by me and considered in my medical decision making (see chart for details).     [name] is a 67 y.o. female here with back pain, hyperglycemia. Has hx of DM and glucose is 250 in the ED. Likely just difficult to control DM. Will give IVF. Will get back xrays. Neurovascular intact so will not need MRI.   9:34 PM Glucose down to 136 with IVF. Thoracic and cervical xrays unremarkable. She can follow up with PCP to discuss tighter glycemic control. She request referral to spine for her back pain. Stable for discharge    Final Clinical Impressions(s) / ED Diagnoses   Final diagnoses:  Upper back pain    ED Discharge Orders    None       Drenda Freeze, MD 07/31/17 2135

## 2017-07-31 NOTE — ED Notes (Signed)
Patient states she feels light-headed and like her sugar is now dropping low.  This RN offered to check again.  No significant change from previous reading

## 2017-07-31 NOTE — ED Triage Notes (Signed)
Pt presents to ED for assessment of her blood sugar, stating she is having difficulty controlling it and states it was so high today she has not eaten anything in fear of making it high.  CBG 229 in triage.

## 2017-07-31 NOTE — Discharge Instructions (Addendum)
Continue your current meds. You need to talk to your doctor regarding diabetes management.   Continue tylenol, motrin for back pain.   Call spine doctor for evaluation of your back pain   Return to ER if you have worse back pain, vomiting, weakness, numbness, glucose < 60 or greater than 500.

## 2017-08-05 ENCOUNTER — Ambulatory Visit (INDEPENDENT_AMBULATORY_CARE_PROVIDER_SITE_OTHER): Payer: Medicare Other | Admitting: Podiatry

## 2017-08-05 ENCOUNTER — Encounter

## 2017-08-05 DIAGNOSIS — L97411 Non-pressure chronic ulcer of right heel and midfoot limited to breakdown of skin: Secondary | ICD-10-CM | POA: Diagnosis not present

## 2017-08-05 DIAGNOSIS — E08621 Diabetes mellitus due to underlying condition with foot ulcer: Secondary | ICD-10-CM | POA: Diagnosis not present

## 2017-08-19 ENCOUNTER — Ambulatory Visit (INDEPENDENT_AMBULATORY_CARE_PROVIDER_SITE_OTHER): Payer: Medicare Other | Admitting: Podiatry

## 2017-08-19 ENCOUNTER — Encounter: Payer: Self-pay | Admitting: Podiatry

## 2017-08-19 DIAGNOSIS — L97501 Non-pressure chronic ulcer of other part of unspecified foot limited to breakdown of skin: Secondary | ICD-10-CM

## 2017-08-19 MED ORDER — SILVER SULFADIAZINE 1 % EX CREA: 1.0000 "application " | TOPICAL_CREAM | Freq: Every day | CUTANEOUS | 1 refills | Status: DC

## 2017-08-22 NOTE — Progress Notes (Signed)
  Subjective:  Patient ID: [name], female    DOB: 01-08-1951,  MRN: [id number]  Chief Complaint  Patient presents with  . Foot Ulcer    right foot lateral side under 5th toe; pt stated, "still little painful, would like refill on ciprofloxacin 500mg "   67 y.o. female returns for the above complaint.  States the wound is still painful.  Has a list of cardiac test that has been performed and ordered to clear her for surgery of her right foot  Objective:  There were no vitals filed for this visit. General AA&O x3. Normal mood and affect.  Vascular Pedal pulses palpable.  Neurologic Epicritic sensation grossly diminished.  Protective sensation absent  Dermatologic Ulcer to the right fifth metatarsal head measuring1x1 post-debridement.  Wound base granular with hyperkeratotic periwound.  No undermining.  No ascending cellulitis.  No warmth or erythema.   HP K first MPJ right.  First and fifth MPJ left  Orthopedic: POP R 5th metatarsal head.   Assessment & Plan:  Patient was evaluated and treated and all questions answered.  Ulcer right fifth metatarsal head, -Debridement as below. -Dressed with silvadene and DSD -Discussed continued need for metatarsal head excision we will plan for the procedure when cardiac clearance obtained.  Procedure: Selective Debridement of Wound Rationale: Removal of devitalized tissue from the wound to promote healing.  Pre-Debridement Wound Measurements: 1 cm x 1 cm x 0.1 cm  Post-Debridement Wound Measurements: same as pre-debridement. Type of Debridement: Selective Tissue Removed: Devitalized soft-tissue Instrumentation: 3-0 mm dermal curette Dressing: Dry, sterile, compression dressing. Disposition: Patient tolerated procedure well. Patient to return in 1 week for follow-up.    Return in about 2 weeks (around 09/02/2017) for Wound Care, Right.

## 2017-08-22 NOTE — Progress Notes (Signed)
  Subjective:  Patient ID: [name], female    DOB: 14-Jun-1950,  MRN: [id number]  Chief Complaint  Patient presents with  . Diabetic Ulcer    right foot  - doing about the same   67 y.o. female returns for the above complaint.  States the right foot is doing about the same still very painful.  Unable to surgery until cleared Objective:  There were no vitals filed for this visit. General AA&O x3. Normal mood and affect.  Vascular Pedal pulses palpable.  Neurologic Epicritic sensation grossly diminished.  Protective sensation absent  Dermatologic Ulcer to the right fifth metatarsal head measuring 0.5x0.5 post-debridement.  Wound base granular with hyperkeratotic periwound.  No undermining.  No ascending cellulitis.  No warmth or erythema.   HP K first MPJ right.  First and fifth MPJ left  Orthopedic: POP R 5th metatarsal head.   Assessment & Plan:  Patient was evaluated and treated and all questions answered.  Ulcer right fifth metatarsal head -Dressed with silvadene and DSD -Discussed continued need for metatarsal head excision pending clearance  Procedure: Selective Debridement of Wound Rationale: Removal of devitalized tissue from the wound to promote healing.  Pre-Debridement Wound Measurements: 0.5 cm x 0.5 cm x 0.1 cm  Post-Debridement Wound Measurements: same as pre-debridement. Type of Debridement: Selective Tissue Removed: Devitalized soft-tissue Instrumentation: 3-0 mm dermal curette Dressing: Dry, sterile, compression dressing. Disposition: Patient tolerated procedure well. Patient to return in 1 week for follow-up.    No follow-ups on file.

## 2017-08-30 ENCOUNTER — Emergency Department (HOSPITAL_COMMUNITY)
Admission: EM | Admit: 2017-08-30 | Discharge: 2017-08-31 | Disposition: A | Discharge status: Home / Self Care | Payer: Medicare Other | Attending: Emergency Medicine | Admitting: Emergency Medicine

## 2017-08-30 ENCOUNTER — Telehealth: Payer: Self-pay | Admitting: Internal Medicine

## 2017-08-30 ENCOUNTER — Emergency Department (HOSPITAL_COMMUNITY): Payer: Medicare Other

## 2017-08-30 ENCOUNTER — Encounter (HOSPITAL_COMMUNITY): Payer: Self-pay | Admitting: Emergency Medicine

## 2017-08-30 ENCOUNTER — Other Ambulatory Visit: Payer: Self-pay

## 2017-08-30 DIAGNOSIS — M791 Myalgia, unspecified site: Secondary | ICD-10-CM | POA: Diagnosis not present

## 2017-08-30 DIAGNOSIS — R0602 Shortness of breath: Secondary | ICD-10-CM | POA: Diagnosis not present

## 2017-08-30 DIAGNOSIS — E1140 Type 2 diabetes mellitus with diabetic neuropathy, unspecified: Secondary | ICD-10-CM | POA: Insufficient documentation

## 2017-08-30 DIAGNOSIS — R079 Chest pain, unspecified: Secondary | ICD-10-CM | POA: Diagnosis not present

## 2017-08-30 DIAGNOSIS — Z79899 Other long term (current) drug therapy: Secondary | ICD-10-CM | POA: Insufficient documentation

## 2017-08-30 DIAGNOSIS — R195 Other fecal abnormalities: Secondary | ICD-10-CM | POA: Diagnosis not present

## 2017-08-30 DIAGNOSIS — Z794 Long term (current) use of insulin: Secondary | ICD-10-CM | POA: Diagnosis not present

## 2017-08-30 DIAGNOSIS — Z87891 Personal history of nicotine dependence: Secondary | ICD-10-CM | POA: Diagnosis not present

## 2017-08-30 DIAGNOSIS — N183 Chronic kidney disease, stage 3 (moderate): Secondary | ICD-10-CM | POA: Diagnosis not present

## 2017-08-30 DIAGNOSIS — I129 Hypertensive chronic kidney disease with stage 1 through stage 4 chronic kidney disease, or unspecified chronic kidney disease: Secondary | ICD-10-CM | POA: Insufficient documentation

## 2017-08-30 DIAGNOSIS — Z7982 Long term (current) use of aspirin: Secondary | ICD-10-CM | POA: Diagnosis not present

## 2017-08-30 DIAGNOSIS — E1122 Type 2 diabetes mellitus with diabetic chronic kidney disease: Secondary | ICD-10-CM | POA: Diagnosis not present

## 2017-08-30 LAB — CBC
HEMATOCRIT: 31.4 % — AB (ref 36.0–46.0)
HEMOGLOBIN: 10.7 g/dL — AB (ref 12.0–15.0)
MCH: 28.6 pg (ref 26.0–34.0)
MCHC: 34.1 g/dL (ref 30.0–36.0)
MCV: 84 fL (ref 78.0–100.0)
Platelets: 156 10*3/uL (ref 150–400)
RBC: 3.74 MIL/uL — ABNORMAL LOW (ref 3.87–5.11)
RDW: 14.8 % (ref 11.5–15.5)
WBC: 3.5 10*3/uL — ABNORMAL LOW (ref 4.0–10.5)

## 2017-08-30 LAB — BASIC METABOLIC PANEL
ANION GAP: 10 (ref 5–15)
BUN: 40 mg/dL — ABNORMAL HIGH (ref 8–23)
CALCIUM: 9.4 mg/dL (ref 8.9–10.3)
CHLORIDE: 110 mmol/L (ref 98–111)
CO2: 21 mmol/L — AB (ref 22–32)
Creatinine, Ser: 1.3 mg/dL — ABNORMAL HIGH (ref 0.44–1.00)
GFR calc Af Amer: 49 mL/min — ABNORMAL LOW (ref >60)
GFR calc non Af Amer: 42 mL/min — ABNORMAL LOW (ref >60)
GLUCOSE: 117 mg/dL — AB (ref 70–99)
Potassium: 4.5 mmol/L (ref 3.5–5.1)
Sodium: 141 mmol/L (ref 135–145)

## 2017-08-30 LAB — I-STAT TROPONIN, ED: TROPONIN I, POC: 0 ng/mL (ref 0.00–0.08)

## 2017-08-30 LAB — POC OCCULT BLOOD, ED: FECAL OCCULT BLD: NEGATIVE

## 2017-08-30 MED ORDER — TRAMADOL HCL 50 MG PO TABS: 50.0000 mg | ORAL_TABLET | Freq: Four times a day (QID) | ORAL | 0 refills | Status: DC | PRN

## 2017-08-30 NOTE — ED Triage Notes (Signed)
Pt reports having chest pain and increasing shortness of breath for the last week. Pt reports pain that goes into back.

## 2017-08-30 NOTE — Discharge Instructions (Addendum)
The dark-colored stools are related to using Pepto-Bismol.  Stop using that for now.  Instead try using Maalox 3 or 4 times a day before meals and at bedtime to help with symptoms of indigestion.  We are prescribing a short-term prescription of pain medicine, tramadol to treat your pain in the muscles of your chest wall.  You can also try using Tylenol for it and using heat on it.

## 2017-08-30 NOTE — ED Provider Notes (Signed)
[facility] DEPT Provider Note   CSN: [id number] Arrival date & time: 08/30/17  2035     History   Chief Complaint Chief Complaint  Patient presents with  . Chest Pain  . Shortness of Breath    HPI [name] is a 67 y.o. female.  HPI   Patient is being followed by GI for abdominal problems, and was recently told that she had portal hypertension.  Today she called her GI doctor because she had noticed 2 episodes of black stool several days ago.  She has been eating well.  She has mild chest discomfort associated with deep breathing, present for several days, and off.  She denies nausea, vomiting, cough, shortness of breath, headache, back pain, weakness or dizziness.  There are no other known modifying factors.  Past Medical History:  Diagnosis Date  . ANEMIA, CHRONIC DISEASE NEC 03/15/2006  . Cirrhosis ([place])   . Colon polyps   . Constipation due to opioid therapy 06/05/2014  . DEGENERATIVE JOINT DISEASE 05/06/2007   On going for >5 yrs No imaging to confirm Has tried ultram, mobic, neurontin which did not work Ambulance person tried once worked well    . Diabetes mellitus    Type II  . DIABETIC  RETINOPATHY 03/15/2006  . 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 Diastolic dysfunction 24/26/8341   Grade 1 by Echocardiogram 12/13/14  . Heart murmur   . Hemorrhoids   . History of kidney stones   . Hyperlipidemia 04/01/2011  . Hypertension   . Moderate aortic regurgitation 09/25/2008   12/13/14 Echocardiogram    . PANCREATITIS, CHRONIC 03/29/2006  . Peripheral arterial disease ([place])   . Pneumonia    age 15-20  . RENAL INSUFFICIENCY, CHRONIC 03/15/2006  . Tonsillar mass    Left    Patient Active Problem List   Diagnosis Date Noted  . Tubular adenoma of colon 06/30/2016  . Diverticulosis of colon without hemorrhage 06/30/2016  . Postmenopausal vaginal bleeding   . Long term (current) use of opiate analgesic 04/09/2016  . Bilateral carotid artery  disease ([place]) 11/28/2015  . Pancreatic insufficiency 05/09/2015  . 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 Risk for coronary artery disease greater than 20% in next 10 years 03/14/2015  . Diastolic dysfunction 96/22/2979  . Vulvar cyst 01/23/2015  . Abnormal uterine bleeding 12/02/2014  . Unexplained night sweats 12/02/2014  . Constipation due to opioid therapy 06/05/2014  . Loss of weight 03/29/2014  . Peripheral arterial disease ([place]) 12/12/2013  . GERD (gastroesophageal reflux disease) 10/12/2012  . Healthcare maintenance 09/29/2012  . Back pain 03/28/2012  . Hyperlipidemia 04/01/2011  . BOILS, RECURRENT 03/13/2010  . Moderate aortic regurgitation 09/25/2008  . HEMORRHOIDS 06/08/2008  . Osteoarthritis 05/06/2007  . TOBACCO ABUSE 09/27/2006  . History of osteoporosis 06/29/2006  . Chronic pancreatitis ([place]) 03/29/2006  . Uncontrolled type 2 diabetes mellitus with chronic kidney disease, without long-term current use of insulin ([place]) 03/15/2006  . Diabetic neuropathy, painful ([place]) 03/15/2006  . Anemia of chronic disease 03/15/2006  . Essential hypertension 03/15/2006  . VENTRICULAR HYPERTROPHY, LEFT 03/15/2006  . CKD stage 3 due to type 2 diabetes mellitus ([place]) 03/15/2006    Past Surgical History:  Procedure Laterality Date  . BREAST SURGERY    . COLONOSCOPY    . DILATION AND CURETTAGE OF UTERUS N/A 06/16/2016   Procedure: DILATATION AND CURETTAGE;  Surgeon: Mora Bellman, MD;  Location: [facility];  Service: Gynecology;  Laterality: N/A;  . HYSTEROSCOPY N/A  06/16/2016   Procedure: HYSTEROSCOPY;  Surgeon: Mora Bellman, MD;  Location: [facility];  Service: Gynecology;  Laterality: N/A;  . MOUTH SURGERY N/A   . POLYPECTOMY N/A 06/16/2016   Procedure: POLYPECTOMY;  Surgeon: Mora Bellman, MD;  Location: [facility];  Service: Gynecology;  Laterality: N/A;     OB History    Gravida  3   Para  3   Term  3   Preterm      AB      Living  3     SAB      TAB       Ectopic      Multiple      Live Births  3            Home Medications    Prior to Admission medications   Medication Sig Start Date End Date Taking? Authorizing Provider  ACCU-CHEK AVIVA PLUS test strip TEST three times a day 11/25/15  Yes Lucious Groves, DO  amLODipine (NORVASC) 10 MG tablet Take 10 mg by mouth daily. 06/01/17  Yes [provider]  aspirin EC 81 MG tablet Take 1 tablet (81 mg total) by mouth daily. 04/09/16  Yes Lucious Groves, DO  atorvastatin (LIPITOR) 40 MG tablet Take 1 tablet (40 mg total) by mouth daily. 03/25/16 06/08/18 Yes Lucious Groves, DO  Blood Glucose Monitoring Suppl (ACCU-CHEK AVIVA PLUS) w/Device KIT Use to check you blood sugar 3 times a day 06/04/15  Yes Oval Linsey, MD  diclofenac sodium (VOLTAREN) 1 % GEL Apply 4 g topically 4 (four) times daily as needed for pain. 06/01/17  Yes [provider]  docusate sodium (COLACE) 100 MG capsule Take 100 mg by mouth 2 (two) times daily as needed (for constipation.).   Yes [provider]  LANTUS SOLOSTAR 100 UNIT/ML Solostar Pen Inject 16 Units into the skin daily.  05/04/17  Yes [provider]  LINZESS 145 MCG CAPS capsule Take 145 mg by mouth daily. 03/07/17  Yes [provider]  meclizine (ANTIVERT) 25 MG tablet Take 1 tablet (25 mg total) by mouth 3 (three) times daily as needed for dizziness. 5/85/27  Yes Delora Fuel, MD  Multiple Vitamin (MULTIVITAMIN WITH MINERALS) TABS tablet Take 1 tablet by mouth daily. One-a-Day Women's 50+   Yes [provider]  Pancrelipase, Lip-Prot-Amyl, 24000-76000 units CPEP Take 4 capsules (96,000 Units total) by mouth See admin instructions. Take 4 capsules by mouth 2-3 times daily before meals Patient taking differently: Take 96,000 Units by mouth 3 (three) times daily before meals.  03/10/16  Yes Lucious Groves, DO  PROAIR HFA 108 (90 Base) MCG/ACT inhaler Inhale 2 puffs into the lungs 4 (four) times daily as needed for  wheezing or shortness of breath. 04/30/17  Yes [provider]  silver sulfADIAZINE (SILVADENE) 1 % cream Apply 1 application topically daily. Apply topically daily to foot. 08/19/17  Yes Evelina Bucy, DPM  sitaGLIPtin (JANUVIA) 50 MG tablet Take 1 tablet (50 mg total) by mouth daily. 09/10/16  Yes Lucious Groves, DO  ciprofloxacin (CIPRO) 500 MG tablet Take 1 tablet (500 mg total) by mouth 2 (two) times daily. Patient not taking: Reported on 08/30/2017 07/28/17   Evelina Bucy, DPM  clindamycin (CLEOCIN) 150 MG capsule Take 1 capsule (150 mg total) by mouth 2 (two) times daily. Patient not taking: Reported on 08/30/2017 07/28/17   Evelina Bucy, DPM  loperamide (IMODIUM) 2 MG capsule Take 1 capsule (2  mg total) 4 (four) times daily as needed by mouth for diarrhea or loose stools. Patient not taking: Reported on 1/0/2585 27/7/82   Delora Fuel, MD  traMADol (ULTRAM) 50 MG tablet Take 1 tablet (50 mg total) by mouth every 6 (six) hours as needed. 08/30/17   Daleen Bo, MD    Family History Family History  Problem Relation Age of Onset  . Dementia Mother   . Colon cancer Neg Hx     Social History Social History   Tobacco Use  . Smoking status: Former Smoker    Packs/day: 0.50    Years: 44.00    Pack years: 22.00    Types: Cigarettes    Last attempt to quit: 01/2017    Years since quitting: 0.6  . Smokeless tobacco: Never Used  . Tobacco comment: tobacco info given 11/26/16  Substance Use Topics  . Alcohol use: Yes    Alcohol/week: 0.0 oz    Comment: occ  . Drug use: No     Allergies   Gabapentin and Nicorette [nicotine]   Review of Systems Review of Systems  All other systems reviewed and are negative.    Physical Exam Updated Vital Signs BP 138/70   Pulse 77   Temp 98.3 F (36.8 C) (Oral)   Resp (!) 23   Ht 5' 5"  (1.651 m)   Wt 59.9 kg (132 lb)   LMP 02/22/2016   SpO2 97%   BMI 21.97 kg/m   Physical Exam  Constitutional: She is oriented to  person, place, and time. She appears well-developed and well-nourished.  HENT:  Head: Normocephalic and atraumatic.  Eyes: Pupils are equal, round, and reactive to light. Conjunctivae and EOM are normal.  Neck: Normal range of motion and phonation normal. Neck supple.  Cardiovascular: Normal rate and regular rhythm.  Pulmonary/Chest: Effort normal and breath sounds normal. She exhibits no tenderness.  Abdominal: Soft. She exhibits no distension. There is no tenderness. There is no guarding.  No hepatosplenomegaly.  Genitourinary:  Genitourinary Comments: Anus normal as an external skin tags consistent with prior hemorrhoids.  Stool is dark, gray to black in color.  Hemoccult testing ordered.  Musculoskeletal: Normal range of motion.  Neurological: She is alert and oriented to person, place, and time. She exhibits normal muscle tone.  Skin: Skin is warm and dry.  Psychiatric: She has a normal mood and affect. Her behavior is normal. Judgment and thought content normal.  Nursing note and vitals reviewed.    ED Treatments / Results  Labs (all labs ordered are listed, but only abnormal results are displayed) Labs Reviewed  BASIC METABOLIC PANEL - Abnormal; Notable for the following components:      Result Value   CO2 21 (*)    Glucose, Bld 117 (*)    BUN 40 (*)    Creatinine, Ser 1.30 (*)    GFR calc non Af Amer 42 (*)    GFR calc Af Amer 49 (*)    All other components within normal limits  CBC - Abnormal; Notable for the following components:   WBC 3.5 (*)    RBC 3.74 (*)    Hemoglobin 10.7 (*)    HCT 31.4 (*)    All other components within normal limits  I-STAT TROPONIN, ED  POC OCCULT BLOOD, ED    EKG EKG Interpretation  Date/Time:  Monday August 30 2017 20:40:43 EDT Ventricular Rate:  75 PR Interval:    QRS Duration: 102 QT Interval:  405 QTC Calculation:  453 R Axis:   -23 Text Interpretation:  Sinus rhythm Left ventricular hypertrophy Anterior Q waves, possibly due  to LVH since last tracing no significant change Confirmed by Daleen Bo [phone]) on 08/30/2017 10:31:43 PM   Radiology Dg Chest 2 View  Result Date: 08/30/2017 CLINICAL DATA:  Increasing mid chest pain and dyspnea over the past 72 hours. Previous smoker. EXAM: CHEST - 2 VIEW COMPARISON:  07/30/2017 FINDINGS: Stable cardiomegaly. Nonaneurysmal thoracic aorta. New bibasilar atelectasis. No overt pulmonary edema, effusion or pneumothorax. Mild degenerative change of the midthoracic spine. IMPRESSION: Stable cardiomegaly.  Subsegmental bibasilar atelectasis. Electronically Signed   By: Ashley Royalty M.D.   On: 08/30/2017 21:21    Procedures Procedures (including critical care time)  Medications Ordered in ED Medications - No data to display   Initial Impression / Assessment and Plan / ED Course  I have reviewed the triage vital signs and the nursing notes.  Pertinent labs & imaging results that were available during my care of the patient were reviewed by me and considered in my medical decision making (see chart for details).  Clinical Course as of Aug 30 2337  Mon Aug 30, 2017  2312 Normal   POC occult blood, ED Provider will collect [EW]  2312 Normal except hemoglobin low 10.7, and white count low 3.5  CBC(!) [EW]  2313 normal  I-stat troponin, ED [EW]  2313 Normal except CO2 low, glucose high, BUN high, creatinine high, GFR low  Basic metabolic panel(!) [EW]  2683 Normal except mild atelectasis, images reviewed  DG Chest 2 View [EW]    Clinical Course User Index [EW] Daleen Bo, MD     Patient Vitals for the past 24 hrs:  BP Temp Temp src Pulse Resp SpO2 Height Weight  08/30/17 2300 138/70 - - 77 (!) 23 97 % - -  08/30/17 2230 (!) 158/66 - - 63 18 97 % - -  08/30/17 2200 (!) 141/80 - - 62 (!) 25 99 % - -  08/30/17 2130 135/60 - - (!) 58 (!) 21 99 % - -  08/30/17 2108 (!) 150/62 - - 62 18 98 % - -  08/30/17 2044 - - - - - - 5' 5"  (1.651 m) 59.9 kg (132 lb)  08/30/17  2043 (!) 160/62 98.3 F (36.8 C) Oral 68 14 100 % - -    10:48 PM Reevaluation with update and discussion. After initial assessment and treatment, an updated evaluation reveals patient remains uncomfortable complaining of pain in her chest and back, which was exacerbated during the exam while moving.  After Tylenol but she stated she could not take it because of her liver problem.  She also complained of "indigestion," describing discomfort in her upper abdomen and chest.  She is taking Pepto-Bismol, for that.  Findings discussed with the patient and all questions were answered. Daleen Bo   Medical Decision Making: Black stool related to her using Pepto-Bismol for indigestion.  Hemoglobin low but stable.  No evidence for metabolic instability or serious bacterial infection.  Nonspecific myalgias, chest wall.  Patient declined using Tylenol because of her "liver problem."  Last liver function tests were done several months ago at that time she had very mild transaminitis.  Patient was offered a short-term prescription of tramadol to use as needed for myalgia.  CRITICAL CARE-no Performed by: Daleen Bo   Nursing Notes Reviewed/ Care Coordinated Applicable Imaging Reviewed Interpretation of Laboratory Data incorporated into ED treatment  The patient appears reasonably screened  and/or stabilized for discharge and I doubt any other medical condition or other [facility] requiring further screening, evaluation, or treatment in the ED at this time prior to discharge.  Plan: Home Medications-continue usual medications; Home Treatments-rest, fluids; return here if the recommended treatment, does not improve the symptoms; Recommended follow up-PCP 1 week and as needed     Final Clinical Impressions(s) / ED Diagnoses   Final diagnoses:  Dark stools  Myalgia    ED Discharge Orders        Ordered    traMADol (ULTRAM) 50 MG tablet  Every 6 hours PRN     08/30/17 2338       Daleen Bo,  MD 08/30/17 2340

## 2017-08-30 NOTE — Telephone Encounter (Signed)
Pt states her PCP told her the "pressure was elevated in her liver." Reviewed with pt that usually pts may have elevated LFT's but pt is sure he said pressure. Pt not a good historian. Pt wants to know if she needs to go to the ER for dark stool. Reports her stool was black a few days ago but pt tool pepto bismol. Discussed with her that the bismuth can cause black stools. States yesterday the stool was greenish brown but it was darker today. Discussed with her to keep an eye on it and if she see black tarry stools then she would want to get this checked out. Pt verbalized understanding.

## 2017-09-03 ENCOUNTER — Ambulatory Visit (INDEPENDENT_AMBULATORY_CARE_PROVIDER_SITE_OTHER): Payer: Medicare Other | Admitting: Podiatry

## 2017-09-03 DIAGNOSIS — L97411 Non-pressure chronic ulcer of right heel and midfoot limited to breakdown of skin: Secondary | ICD-10-CM | POA: Diagnosis not present

## 2017-09-03 DIAGNOSIS — E08621 Diabetes mellitus due to underlying condition with foot ulcer: Secondary | ICD-10-CM

## 2017-09-03 NOTE — Progress Notes (Signed)
  Subjective:  Patient ID: [name], female    DOB: May 24, 1950,  MRN: [id number]  Chief Complaint  Patient presents with  . Diabetic Ulcer    2 wk wound care right foot   67 y.o. female returns for the above complaint. Wound doing better. Has appt for cardiac clearance early August, tests scheduled later this month.  Objective:  There were no vitals filed for this visit. General AA&O x3. Normal mood and affect.  Vascular Pedal pulses palpable.  Neurologic Epicritic sensation grossly diminished.  Protective sensation absent  Dermatologic Ulcer to the right fifth metatarsal head measuring 0.2 x0.2  post-debridement.  Wound base granular with hyperkeratotic periwound.  No undermining.  No ascending cellulitis.  No warmth or erythema.   HP K first MPJ right.  First and fifth MPJ left  Orthopedic: POP R 5th metatarsal head.   Assessment & Plan:  Patient was evaluated and treated and all questions answered.  Ulcer right fifth metatarsal head, -Debridement as below. -Dressed with silvadene and DSD -Discussed continued need for metatarsal head excision we will plan for the procedure when cardiac clearance obtained. Has apointments at the end of this month.  Procedure: Excisional Debridement of Wound Rationale: Removal of non-viable soft tissue from the wound to promote healing.  Anesthesia: none Pre-Debridement Wound Measurements: overlying hyperkeratosis  Post-Debridement Wound Measurements: 0.2 cm x 0.2 cm x 0.1 cm  Type of Debridement: Excisional Tissue Removed: Non-viable soft tissue Depth of Debridement: subq Instrumentation: 312 blade, tissue nipper. Technique: Sharp excisional debridement to bleeding, viable wound base.  Dressing: Dry, sterile, compression dressing. Disposition: Patient tolerated procedure well. Patient to return in 1 week for follow-up.   Return in about 2 weeks (around 09/17/2017) for Wound Care, Right.

## 2017-09-12 NOTE — Progress Notes (Signed)
  Subjective:  Patient ID: [name], female    DOB: Sep 16, 1950,  MRN: [id number]  Chief Complaint  Patient presents with  . Diabetic Ulcer    right midfoot ulcer - doing about the same   67 y.o. female returns for the above complaint.  Believes the wound is doing about the same. Objective:  There were no vitals filed for this visit. General AA&O x3. Normal mood and affect.  Vascular Pedal pulses palpable.  Neurologic Epicritic sensation grossly diminished.  Protective sensation absent  Dermatologic  Ulcer to the right fifth metatarsal head measuring 0.5x0.5 post-debridement.  Wound base granular with hyperkeratotic periwound.  No undermining.  No ascending cellulitis.  No warmth or erythema.  Probe to bone noted  Orthopedic: No pain to palpation either foot.   Assessment & Plan:  Patient was evaluated and treated and all questions answered.  Ulcer right fifth metatarsal head, -Debridement as below. -Dressed with Silvadene and DSD -Discussed surgical excision of the metatarsal head as the wound appears deep to bone.  Consent reviewed and signed by patient.  We will plan for surgical excision metatarsal  Procedure: Excisional Debridement of Wound Rationale: Removal of non-viable soft tissue from the wound to promote healing.  Anesthesia: none Pre-Debridement Wound Measurements: overlying hyperkeratosis  Post-Debridement Wound Measurements: 0.5 cm x 0.5 cm x 0.1 cm  Type of Debridement: Excisional Tissue Removed: Non-viable soft tissue Depth of Debridement: subq Instrumentation: 312 blade Technique: Sharp excisional debridement to bleeding, viable wound base.  Dressing: Dry, sterile, compression dressing. Disposition: Patient tolerated procedure well. Patient to return in 1 week for follow-up.  Return in about 1 month (around 07/29/2017) for post-op care.

## 2017-09-23 ENCOUNTER — Ambulatory Visit (INDEPENDENT_AMBULATORY_CARE_PROVIDER_SITE_OTHER): Payer: Medicare Other | Admitting: Podiatry

## 2017-09-23 DIAGNOSIS — E1142 Type 2 diabetes mellitus with diabetic polyneuropathy: Secondary | ICD-10-CM

## 2017-09-23 DIAGNOSIS — B351 Tinea unguium: Secondary | ICD-10-CM

## 2017-09-23 DIAGNOSIS — E08621 Diabetes mellitus due to underlying condition with foot ulcer: Secondary | ICD-10-CM

## 2017-09-23 DIAGNOSIS — L97411 Non-pressure chronic ulcer of right heel and midfoot limited to breakdown of skin: Secondary | ICD-10-CM | POA: Diagnosis not present

## 2017-10-05 ENCOUNTER — Encounter (HOSPITAL_COMMUNITY): Payer: Self-pay

## 2017-10-05 ENCOUNTER — Emergency Department (HOSPITAL_COMMUNITY)
Admission: EM | Admit: 2017-10-05 | Discharge: 2017-10-06 | Disposition: A | Discharge status: Home / Self Care | Payer: Medicare Other | Attending: Emergency Medicine | Admitting: Emergency Medicine

## 2017-10-05 ENCOUNTER — Emergency Department (HOSPITAL_COMMUNITY): Payer: Medicare Other

## 2017-10-05 DIAGNOSIS — M79602 Pain in left arm: Secondary | ICD-10-CM | POA: Diagnosis not present

## 2017-10-05 DIAGNOSIS — R2243 Localized swelling, mass and lump, lower limb, bilateral: Secondary | ICD-10-CM | POA: Insufficient documentation

## 2017-10-05 DIAGNOSIS — R51 Headache: Secondary | ICD-10-CM | POA: Insufficient documentation

## 2017-10-05 DIAGNOSIS — R079 Chest pain, unspecified: Secondary | ICD-10-CM | POA: Diagnosis present

## 2017-10-05 DIAGNOSIS — R0602 Shortness of breath: Secondary | ICD-10-CM | POA: Insufficient documentation

## 2017-10-05 DIAGNOSIS — R0789 Other chest pain: Secondary | ICD-10-CM

## 2017-10-05 LAB — CBC
HCT: 31.7 % — ABNORMAL LOW (ref 36.0–46.0)
Hemoglobin: 9.9 g/dL — ABNORMAL LOW (ref 12.0–15.0)
MCH: 28.2 pg (ref 26.0–34.0)
MCHC: 31.2 g/dL (ref 30.0–36.0)
MCV: 90.3 fL (ref 78.0–100.0)
Platelets: 145 10*3/uL — ABNORMAL LOW (ref 150–400)
RBC: 3.51 MIL/uL — AB (ref 3.87–5.11)
RDW: 14.9 % (ref 11.5–15.5)
WBC: 3.8 10*3/uL — ABNORMAL LOW (ref 4.0–10.5)

## 2017-10-05 LAB — BASIC METABOLIC PANEL
Anion gap: 11 (ref 5–15)
BUN: 33 mg/dL — ABNORMAL HIGH (ref 8–23)
CO2: 21 mmol/L — ABNORMAL LOW (ref 22–32)
CREATININE: 1.32 mg/dL — AB (ref 0.44–1.00)
Calcium: 8.5 mg/dL — ABNORMAL LOW (ref 8.9–10.3)
Chloride: 98 mmol/L (ref 98–111)
GFR calc non Af Amer: 41 mL/min — ABNORMAL LOW (ref >60)
GFR, EST AFRICAN AMERICAN: 48 mL/min — AB (ref >60)
Glucose, Bld: 560 mg/dL (ref 70–99)
Potassium: 4.3 mmol/L (ref 3.5–5.1)
SODIUM: 130 mmol/L — AB (ref 135–145)

## 2017-10-05 LAB — I-STAT TROPONIN, ED
Troponin i, poc: 0 ng/mL (ref 0.00–0.08)
Troponin i, poc: 0 ng/mL (ref 0.00–0.08)

## 2017-10-05 LAB — CBG MONITORING, ED
GLUCOSE-CAPILLARY: 454 mg/dL — AB (ref 70–99)
Glucose-Capillary: 100 mg/dL — ABNORMAL HIGH (ref 70–99)

## 2017-10-05 LAB — D-DIMER, QUANTITATIVE (NOT AT ARMC): D DIMER QUANT: 0.75 ug{FEU}/mL — AB (ref 0.00–0.50)

## 2017-10-05 MED ORDER — SODIUM CHLORIDE 0.9 % IV BOLUS
1000.0000 mL | Freq: Once | INTRAVENOUS | Status: AC
  Administered 2017-10-05: 1000 mL via INTRAVENOUS

## 2017-10-05 MED ORDER — IOPAMIDOL (ISOVUE-370) INJECTION 76%
INTRAVENOUS | Status: AC
  Filled 2017-10-05: qty 100

## 2017-10-05 MED ORDER — HYDROMORPHONE HCL 1 MG/ML IJ SOLN
0.5000 mg | Freq: Once | INTRAMUSCULAR | Status: AC
  Administered 2017-10-05: 0.5 mg via INTRAVENOUS
  Filled 2017-10-05: qty 1

## 2017-10-05 MED ORDER — IOPAMIDOL (ISOVUE-370) INJECTION 76%
100.0000 mL | Freq: Once | INTRAVENOUS | Status: AC | PRN
  Administered 2017-10-05: 100 mL via INTRAVENOUS

## 2017-10-05 MED ORDER — HYDROCODONE-ACETAMINOPHEN 5-325 MG PO TABS: 1.0000 | ORAL_TABLET | Freq: Four times a day (QID) | ORAL | 0 refills | Status: DC | PRN

## 2017-10-05 NOTE — ED Notes (Signed)
Pt in CT.

## 2017-10-05 NOTE — Discharge Instructions (Addendum)
Follow-up with your doctor next week for recheck 

## 2017-10-05 NOTE — ED Triage Notes (Signed)
Pt presents with onset of L sided chest pain while watching TV today.  Pt reports pain radiates into L arm and around to L scapula; +shortness of breath and headache.  Edema reported to both legs.

## 2017-10-05 NOTE — ED Provider Notes (Signed)
Patient placed in Quick Look pathway, seen and evaluated   Chief Complaint: Chest pain left sided chest pain.    HPI:   Pt reports pain began at rest. Left sided and left arm   ROS: no fever, no chills   Physical Exam:   Gen: No distress  Neuro: Awake and Alert  Skin: Warm    Focused Exam: Lungs clear Heart rrr    Initiation of care has begun. The patient has been counseled on the process, plan, and necessity for staying for the completion/evaluation, and the remainder of the medical screening examination   [name] 10/05/17 1733    Elnora Morrison, MD 10/10/17 [phone]

## 2017-10-08 ENCOUNTER — Ambulatory Visit (INDEPENDENT_AMBULATORY_CARE_PROVIDER_SITE_OTHER): Payer: Medicare Other | Admitting: Podiatry

## 2017-10-08 ENCOUNTER — Encounter: Payer: Self-pay | Admitting: Podiatry

## 2017-10-08 DIAGNOSIS — I739 Peripheral vascular disease, unspecified: Secondary | ICD-10-CM

## 2017-10-08 DIAGNOSIS — E08621 Diabetes mellitus due to underlying condition with foot ulcer: Secondary | ICD-10-CM

## 2017-10-08 DIAGNOSIS — M779 Enthesopathy, unspecified: Secondary | ICD-10-CM

## 2017-10-08 DIAGNOSIS — M21961 Unspecified acquired deformity of right lower leg: Secondary | ICD-10-CM | POA: Diagnosis not present

## 2017-10-08 DIAGNOSIS — L97411 Non-pressure chronic ulcer of right heel and midfoot limited to breakdown of skin: Secondary | ICD-10-CM

## 2017-10-08 NOTE — Progress Notes (Signed)
Subjective:  Patient ID: [name], female    DOB: January 30, 1951,  MRN: [id number]  Chief Complaint  Patient presents with  . Wound Check    Rt sub 5th callused lesion  . Foot Pain    Lt foot burning type pain, states recent vascualr studies show blood flow compromise to lt leg    67 y.o. female presents with the above complaint.  States that she is having left foot burning pain and pain in the right foot callus.  Was told recently that blood flow to the left leg was compromised.   Review of Systems: Negative except as noted in the HPI. Denies N/V/F/Ch.  Past Medical History:  Diagnosis Date  . ANEMIA, CHRONIC DISEASE NEC 03/15/2006  . Cirrhosis ([place])   . Colon polyps   . Constipation due to opioid therapy 06/05/2014  . DEGENERATIVE JOINT DISEASE 05/06/2007   On going for >5 yrs No imaging to confirm Has tried ultram, mobic, neurontin which did not work Ambulance person tried once worked well    . Diabetes mellitus    Type II  . DIABETIC  RETINOPATHY 03/15/2006  . 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 Diastolic dysfunction 40/98/1191   Grade 1 by Echocardiogram 12/13/14  . Heart murmur   . Hemorrhoids   . History of kidney stones   . Hyperlipidemia 04/01/2011  . Hypertension   . Moderate aortic regurgitation 09/25/2008   12/13/14 Echocardiogram    . PANCREATITIS, CHRONIC 03/29/2006  . Peripheral arterial disease ([place])   . Pneumonia    age 9-20  . RENAL INSUFFICIENCY, CHRONIC 03/15/2006  . Tonsillar mass    Left    Current Outpatient Medications:  .  ACCU-CHEK AVIVA PLUS test strip, TEST three times a day, Disp: 100 each, Rfl: 11 .  amLODipine (NORVASC) 10 MG tablet, Take 10 mg by mouth daily., Disp: , Rfl:  .  aspirin EC 81 MG tablet, Take 1 tablet (81 mg total) by mouth daily., Disp: , Rfl:  .  atorvastatin (LIPITOR) 40 MG tablet, Take 1 tablet (40 mg total) by mouth daily. (Patient not taking: Reported on 10/05/2017), Disp: 90 tablet, Rfl: 3 .  Blood Glucose Monitoring  Suppl (ACCU-CHEK AVIVA PLUS) w/Device KIT, Use to check you blood sugar 3 times a day, Disp: 1 kit, Rfl: 1 .  ciprofloxacin (CIPRO) 500 MG tablet, Take 1 tablet (500 mg total) by mouth 2 (two) times daily. (Patient not taking: Reported on 10/05/2017), Disp: 14 tablet, Rfl: 0 .  clindamycin (CLEOCIN) 150 MG capsule, Take 1 capsule (150 mg total) by mouth 2 (two) times daily. (Patient not taking: Reported on 10/05/2017), Disp: 14 capsule, Rfl: 0 .  diclofenac sodium (VOLTAREN) 1 % GEL, Apply 4 g topically 4 (four) times daily as needed for pain., Disp: , Rfl: 5 .  docusate sodium (COLACE) 100 MG capsule, Take 100 mg by mouth daily as needed (for constipation). , Disp: , Rfl:  .  HYDROcodone-acetaminophen (NORCO/VICODIN) 5-325 MG tablet, Take 1 tablet by mouth [name] 6 (six) hours as needed for moderate pain., Disp: 15 tablet, Rfl: 0 .  LANTUS SOLOSTAR 100 UNIT/ML Solostar Pen, Inject 18 Units into the skin daily after breakfast. , Disp: , Rfl: 5 .  LINZESS 145 MCG CAPS capsule, Take 145 mcg by mouth daily. , Disp: , Rfl:  .  loperamide (IMODIUM) 2 MG capsule, Take 1 capsule (2 mg total) 4 (four) times daily as needed by mouth for diarrhea or loose stools., Disp: 12 capsule, Rfl: 0 .  meclizine (ANTIVERT) 25 MG tablet, Take 1 tablet (25 mg total) by mouth 3 (three) times daily as needed for dizziness., Disp: 30 tablet, Rfl: 0 .  Multiple Vitamins-Minerals (ONE-A-DAY WOMENS 50+ ADVANTAGE) TABS, Take 1 tablet by mouth daily with breakfast., Disp: , Rfl:  .  Pancrelipase, Lip-Prot-Amyl, 24000-76000 units CPEP, Take 4 capsules (96,000 Units total) by mouth See admin instructions. Take 4 capsules by mouth 2-3 times daily before meals (Patient taking differently: Take 2-4 capsules by mouth See admin instructions. Take 4 capsules by mouth two to three times a day with meals and 2 capsules with any snacks), Disp: 1170 capsule, Rfl: 3 .  PROAIR HFA 108 (90 Base) MCG/ACT inhaler, Inhale 2 puffs into the lungs 4 (four)  times daily as needed for wheezing or shortness of breath., Disp: , Rfl: 3 .  promethazine (PHENERGAN) 12.5 MG tablet, Take 12.5 mg by mouth [name] 8 (eight) hours as needed for nausea or vomiting. , Disp: , Rfl:  .  rosuvastatin (CRESTOR) 20 MG tablet, Take 20 mg by mouth daily., Disp: , Rfl:  .  silver sulfADIAZINE (SILVADENE) 1 % cream, Apply 1 application topically daily. Apply topically daily to foot. (Patient taking differently: Apply 1 application topically See admin instructions. Apply to bottom of right foot as needed for irritation), Disp: 20 g, Rfl: 1 .  sitaGLIPtin (JANUVIA) 50 MG tablet, Take 1 tablet (50 mg total) by mouth daily., Disp: 12 tablet, Rfl: 0 .  traMADol (ULTRAM) 50 MG tablet, Take 1 tablet (50 mg total) by mouth [name] 6 (six) hours as needed. (Patient taking differently: Take 50 mg by mouth [name] 6 (six) hours as needed (for pain). ), Disp: 15 tablet, Rfl: 0  Social History   Tobacco Use  Smoking Status Former Smoker  . Packs/day: 0.50  . Years: 44.00  . Pack years: 22.00  . Types: Cigarettes  . Last attempt to quit: 01/2017  . Years since quitting: 0.7  Smokeless Tobacco Never Used  Tobacco Comment   tobacco info given 11/26/16    Allergies  Allergen Reactions  . Gabapentin Swelling    Legs and feet swell  . Atorvastatin Other (See Comments)    Caused muscles in her back to be very sore  . Nicorette [Nicotine] Nausea Only   Objective:  There were no vitals filed for this visit. There is no height or weight on file to calculate BMI. Constitutional Well developed. Well nourished.  Vascular Dorsalis pedis pulses palpable bilaterally. Posterior tibial pulses faintly palpable bilaterally. Capillary refill normal to all digits.  No cyanosis or clubbing noted. Pedal hair growth normal.  Neurologic Normal speech. Oriented to person, place, and time. Epicritic sensation to light touch grossly present bilaterally.  Dermatologic Nails well groomed and normal  in appearance. Pre-ulcer right fifth MPJ no open ulceration. No skin lesions.  Orthopedic: Normal joint ROM without pain or crepitus bilaterally. No visible deformities. Pain on palpation right fifth MPJ   Radiographs: None today Assessment:  No diagnosis found. Plan:  Patient was evaluated and treated and all questions answered.  Ulcer R 5th MPJ -Remains healed.  PAD -Await further work-up.  We will follow-up results.  Return in about 4 weeks (around 11/05/2017) for Wound Care, Right.

## 2017-10-20 ENCOUNTER — Telehealth: Payer: Self-pay | Admitting: Internal Medicine

## 2017-10-20 NOTE — Telephone Encounter (Signed)
Pt was seen by ENT and diagnosed with acid reflux. Omeprazole and ranitidine were sent in for her to take. Pt is concerned because she read possible side effects and the papers mentioned liver issues. She called the ENT and was told to check with Dr. Henrene Pastor to see if she is ok to take the omeprazole 40mg  in the am and zantac 150mg  at bedtime. Please advise.

## 2017-10-20 NOTE — Telephone Encounter (Signed)
Pt aware.

## 2017-10-20 NOTE — Telephone Encounter (Signed)
It is just fine for her to take these. No problems

## 2017-10-23 NOTE — Progress Notes (Signed)
  Subjective:  Patient ID: [name], female    DOB: 1950-10-26,  MRN: [id number]  Chief Complaint  Patient presents with  . Diabetic Ulcer    2 week wound check   67 y.o. female returns for the above complaint.  Believes the wound is doing very well.  Still having some pain however.  Request care of her nails today.  Objective:  There were no vitals filed for this visit. General AA&O x3. Normal mood and affect.  Vascular Pedal pulses palpable.  Neurologic Epicritic sensation grossly diminished.  Protective sensation absent  Dermatologic Ulcer to the right fifth metatarsal head essentially epithelialized.  No warmth or erythema. Nails x10 elongated thickened dystrophic  Orthopedic: POP R 5th metatarsal head.   Assessment & Plan:  Patient was evaluated and treated and all questions answered.  Ulcer right fifth metatarsal head, -Ulceration essentially epithelialized.  DM with diabetic peripheral neuropathy, onychomycosis -Nails debrided x10  Procedure: Nail Debridement Rationale: Patient meets criteria for routine foot care due to DPN Type of Debridement: manual, sharp debridement. Instrumentation: Nail nipper, rotary burr. Number of Nails: 10      Return in about 2 weeks (around 10/07/2017) for Wound Care.

## 2017-11-05 ENCOUNTER — Ambulatory Visit (INDEPENDENT_AMBULATORY_CARE_PROVIDER_SITE_OTHER): Payer: Medicare Other | Admitting: Podiatry

## 2017-11-05 DIAGNOSIS — E11621 Type 2 diabetes mellitus with foot ulcer: Secondary | ICD-10-CM

## 2017-11-05 DIAGNOSIS — L97411 Non-pressure chronic ulcer of right heel and midfoot limited to breakdown of skin: Secondary | ICD-10-CM

## 2017-11-05 DIAGNOSIS — E08621 Diabetes mellitus due to underlying condition with foot ulcer: Secondary | ICD-10-CM

## 2017-11-05 DIAGNOSIS — E1151 Type 2 diabetes mellitus with diabetic peripheral angiopathy without gangrene: Secondary | ICD-10-CM

## 2017-11-05 DIAGNOSIS — L858 Other specified epidermal thickening: Secondary | ICD-10-CM

## 2017-11-05 DIAGNOSIS — Q828 Other specified congenital malformations of skin: Secondary | ICD-10-CM

## 2017-11-05 NOTE — Progress Notes (Signed)
Subjective:  Patient ID: [name], female    DOB: Feb 18, 1951,  MRN: [id number]  Chief Complaint  Patient presents with  . Foot Ulcer    right mid foot    67 y.o. female presents with the above complaint.  Fifth toe was previously open area on the right foot starting to hurt more again.  Completed all vascular testing at this point.  Would like to consider surgery but working on her A1c as she knows it needs to be lower prior to surgery  Review of Systems: Negative except as noted in the HPI. Denies N/V/F/Ch.  Past Medical History:  Diagnosis Date  . ANEMIA, CHRONIC DISEASE NEC 03/15/2006  . Cirrhosis ([place])   . Colon polyps   . Constipation due to opioid therapy 06/05/2014  . DEGENERATIVE JOINT DISEASE 05/06/2007   On going for >5 yrs No imaging to confirm Has tried ultram, mobic, neurontin which did not work Ambulance person tried once worked well    . Diabetes mellitus    Type II  . DIABETIC  RETINOPATHY 03/15/2006  . 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 Diastolic dysfunction 44/04/4740   Grade 1 by Echocardiogram 12/13/14  . Heart murmur   . Hemorrhoids   . History of kidney stones   . Hyperlipidemia 04/01/2011  . Hypertension   . Moderate aortic regurgitation 09/25/2008   12/13/14 Echocardiogram    . PANCREATITIS, CHRONIC 03/29/2006  . Peripheral arterial disease ([place])   . Pneumonia    age 56-20  . RENAL INSUFFICIENCY, CHRONIC 03/15/2006  . Tonsillar mass    Left    Current Outpatient Medications:  .  ACCU-CHEK AVIVA PLUS test strip, TEST three times a day, Disp: 100 each, Rfl: 11 .  amLODipine (NORVASC) 10 MG tablet, Take 10 mg by mouth daily., Disp: , Rfl:  .  aspirin EC 81 MG tablet, Take 1 tablet (81 mg total) by mouth daily., Disp: , Rfl:  .  atorvastatin (LIPITOR) 40 MG tablet, Take 1 tablet (40 mg total) by mouth daily. (Patient not taking: Reported on 10/05/2017), Disp: 90 tablet, Rfl: 3 .  Blood Glucose Monitoring Suppl (ACCU-CHEK AVIVA PLUS) w/Device KIT, Use to  check you blood sugar 3 times a day, Disp: 1 kit, Rfl: 1 .  ciprofloxacin (CIPRO) 500 MG tablet, Take 1 tablet (500 mg total) by mouth 2 (two) times daily. (Patient not taking: Reported on 10/05/2017), Disp: 14 tablet, Rfl: 0 .  clindamycin (CLEOCIN) 150 MG capsule, Take 1 capsule (150 mg total) by mouth 2 (two) times daily. (Patient not taking: Reported on 10/05/2017), Disp: 14 capsule, Rfl: 0 .  diclofenac sodium (VOLTAREN) 1 % GEL, Apply 4 g topically 4 (four) times daily as needed for pain., Disp: , Rfl: 5 .  docusate sodium (COLACE) 100 MG capsule, Take 100 mg by mouth daily as needed (for constipation). , Disp: , Rfl:  .  HYDROcodone-acetaminophen (NORCO/VICODIN) 5-325 MG tablet, Take 1 tablet by mouth [name] 6 (six) hours as needed for moderate pain., Disp: 15 tablet, Rfl: 0 .  LANTUS SOLOSTAR 100 UNIT/ML Solostar Pen, Inject 18 Units into the skin daily after breakfast. , Disp: , Rfl: 5 .  LINZESS 145 MCG CAPS capsule, Take 145 mcg by mouth daily. , Disp: , Rfl:  .  loperamide (IMODIUM) 2 MG capsule, Take 1 capsule (2 mg total) 4 (four) times daily as needed by mouth for diarrhea or loose stools., Disp: 12 capsule, Rfl: 0 .  meclizine (ANTIVERT) 25 MG tablet, Take 1 tablet (  25 mg total) by mouth 3 (three) times daily as needed for dizziness., Disp: 30 tablet, Rfl: 0 .  Multiple Vitamins-Minerals (ONE-A-DAY WOMENS 50+ ADVANTAGE) TABS, Take 1 tablet by mouth daily with breakfast., Disp: , Rfl:  .  Pancrelipase, Lip-Prot-Amyl, 24000-76000 units CPEP, Take 4 capsules (96,000 Units total) by mouth See admin instructions. Take 4 capsules by mouth 2-3 times daily before meals (Patient taking differently: Take 2-4 capsules by mouth See admin instructions. Take 4 capsules by mouth two to three times a day with meals and 2 capsules with any snacks), Disp: 1170 capsule, Rfl: 3 .  PROAIR HFA 108 (90 Base) MCG/ACT inhaler, Inhale 2 puffs into the lungs 4 (four) times daily as needed for wheezing or shortness of  breath., Disp: , Rfl: 3 .  promethazine (PHENERGAN) 12.5 MG tablet, Take 12.5 mg by mouth [name] 8 (eight) hours as needed for nausea or vomiting. , Disp: , Rfl:  .  rosuvastatin (CRESTOR) 20 MG tablet, Take 20 mg by mouth daily., Disp: , Rfl:  .  silver sulfADIAZINE (SILVADENE) 1 % cream, Apply 1 application topically daily. Apply topically daily to foot. (Patient taking differently: Apply 1 application topically See admin instructions. Apply to bottom of right foot as needed for irritation), Disp: 20 g, Rfl: 1 .  sitaGLIPtin (JANUVIA) 50 MG tablet, Take 1 tablet (50 mg total) by mouth daily., Disp: 12 tablet, Rfl: 0 .  traMADol (ULTRAM) 50 MG tablet, Take 1 tablet (50 mg total) by mouth [name] 6 (six) hours as needed. (Patient taking differently: Take 50 mg by mouth [name] 6 (six) hours as needed (for pain). ), Disp: 15 tablet, Rfl: 0  Social History   Tobacco Use  Smoking Status Former Smoker  . Packs/day: 0.50  . Years: 44.00  . Pack years: 22.00  . Types: Cigarettes  . Last attempt to quit: 01/2017  . Years since quitting: 0.7  Smokeless Tobacco Never Used  Tobacco Comment   tobacco info given 11/26/16    Allergies  Allergen Reactions  . Gabapentin Swelling    Legs and feet swell  . Atorvastatin Other (See Comments)    Caused muscles in her back to be very sore  . Nicorette [Nicotine] Nausea Only   Objective:  There were no vitals filed for this visit. There is no height or weight on file to calculate BMI. Constitutional Well developed. Well nourished.  Vascular Dorsalis pedis pulses palpable bilaterally. Posterior tibial pulses non-palpable bilaterally. Capillary refill normal to all digits.  No cyanosis or clubbing noted. Pedal hair growth normal.  Neurologic Normal speech. Oriented to person, place, and time. Epicritic sensation to light touch grossly present bilaterally.  Dermatologic Nails well groomed and normal in appearance. No skin lesions. Hyperkeratoses  present sub-met 1 left and heel Right fifth MPJ hyperkeratosis with open ulceration upon debridement measuring 0.5 x 0.5 wound base granular with hyperkeratotic rim.  No warmth erythema extending cellulitis signs of acute infection   Orthopedic: Normal joint ROM without pain or crepitus bilaterally. No visible deformities. Pain on palpation right fifth MPJ   Radiographs: None today Assessment:   1. Diabetic ulcer of right midfoot associated with diabetes mellitus due to underlying condition, limited to breakdown of skin [facility])    Plan:  Patient was evaluated and treated and all questions answered.  Ulcer R 5th MPJ -Debrided today.  See below -Dressed with medihoney and dry sterile dressing. Will still consider surgical excision of the fifth metatarsal head pending lowering of patient's A1c.  Procedure: Excisional Debridement of Wound Rationale: Removal of non-viable soft tissue from the wound to promote healing.  Anesthesia: none Pre-Debridement Wound Measurements: Overlying hyperkeratosis  Post-Debridement Wound Measurements: 0.5 cm x 0.5 cm x 0.1 cm  Type of Debridement: Sharp Excisional Tissue Removed: Non-viable soft tissue Depth of Debridement: subcutaneous tissue. Technique: Sharp excisional debridement to bleeding, viable wound base.  Dressing: Dry, sterile, compression dressing. Disposition: Patient tolerated procedure well. Patient to return in 1 week for follow-up.  DM with PAD -Hyperkeratosie L foot debrided with 312 blade.  Procedure: Paring of Lesion Rationale: painful hyperkeratotic lesion Type of Debridement: manual, sharp debridement. Instrumentation: 312 blade Number of Lesions: 2   Return in about 1 month (around 12/05/2017) for Wound Care, Right.

## 2017-11-22 ENCOUNTER — Emergency Department (HOSPITAL_COMMUNITY)
Admission: EM | Admit: 2017-11-22 | Discharge: 2017-11-22 | Disposition: A | Discharge status: Home / Self Care | Payer: Medicare Other | Attending: Emergency Medicine | Admitting: Emergency Medicine

## 2017-11-22 ENCOUNTER — Other Ambulatory Visit: Payer: Self-pay

## 2017-11-22 ENCOUNTER — Encounter (HOSPITAL_COMMUNITY): Payer: Self-pay | Admitting: Emergency Medicine

## 2017-11-22 ENCOUNTER — Emergency Department (HOSPITAL_COMMUNITY): Payer: Medicare Other

## 2017-11-22 DIAGNOSIS — Z87891 Personal history of nicotine dependence: Secondary | ICD-10-CM | POA: Diagnosis not present

## 2017-11-22 DIAGNOSIS — N183 Chronic kidney disease, stage 3 (moderate): Secondary | ICD-10-CM | POA: Insufficient documentation

## 2017-11-22 DIAGNOSIS — Z7984 Long term (current) use of oral hypoglycemic drugs: Secondary | ICD-10-CM | POA: Diagnosis not present

## 2017-11-22 DIAGNOSIS — R042 Hemoptysis: Secondary | ICD-10-CM

## 2017-11-22 DIAGNOSIS — E1122 Type 2 diabetes mellitus with diabetic chronic kidney disease: Secondary | ICD-10-CM | POA: Diagnosis not present

## 2017-11-22 DIAGNOSIS — I129 Hypertensive chronic kidney disease with stage 1 through stage 4 chronic kidney disease, or unspecified chronic kidney disease: Secondary | ICD-10-CM | POA: Diagnosis not present

## 2017-11-22 DIAGNOSIS — Z79899 Other long term (current) drug therapy: Secondary | ICD-10-CM | POA: Diagnosis not present

## 2017-11-22 DIAGNOSIS — Z7982 Long term (current) use of aspirin: Secondary | ICD-10-CM | POA: Diagnosis not present

## 2017-11-22 NOTE — Discharge Instructions (Signed)
Follow up with your PCP, return for worsening.

## 2017-11-22 NOTE — ED Triage Notes (Signed)
Pt to ER for evaluation of "spitting blood this morning." reports was clearing her throat and spit, and noticed blood in it. Pt in NAD.

## 2017-11-22 NOTE — ED Provider Notes (Signed)
[facility] EMERGENCY DEPARTMENT Provider Note   CSN: [id number] Arrival date & time: 11/22/17  6381     History   Chief Complaint Chief Complaint  Patient presents with  . Hemoptysis    HPI Issabella C [name] is a 67 y.o. female.  67 yo F with a chief complaint of blood being in her mouth.  She woke up this morning and swished water in her mouth and spit out and realized that there was blood in there.  She brushed the inside of her gums and did not see any blood and then she made herself cough a few times and spit again and thought that there was blood in that.  She has had no issues since but decided she should be evaluated in the emergency department.  She has a history of cirrhosis.  Is a history of chronic nosebleeds.  She denies cough congestion shortness of breath or chest pain.  The history is provided by the patient.  Illness  This is a new problem. The current episode started 1 to 2 hours ago. The problem occurs constantly. The problem has not changed since onset.Pertinent negatives include no chest pain, no abdominal pain, no headaches and no shortness of breath. Nothing aggravates the symptoms. Nothing relieves the symptoms. She has tried nothing for the symptoms. The treatment provided no relief.    Past Medical History:  Diagnosis Date  . ANEMIA, CHRONIC DISEASE NEC 03/15/2006  . Cirrhosis ([place])   . Colon polyps   . Constipation due to opioid therapy 06/05/2014  . DEGENERATIVE JOINT DISEASE 05/06/2007   On going for >5 yrs No imaging to confirm Has tried ultram, mobic, neurontin which did not work Ambulance person tried once worked well    . Diabetes mellitus    Type II  . DIABETIC  RETINOPATHY 03/15/2006  . 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 Diastolic dysfunction 77/12/6577   Grade 1 by Echocardiogram 12/13/14  . Heart murmur   . Hemorrhoids   . History of kidney stones   . Hyperlipidemia 04/01/2011  . Hypertension   . Moderate aortic regurgitation  09/25/2008   12/13/14 Echocardiogram    . PANCREATITIS, CHRONIC 03/29/2006  . Peripheral arterial disease ([place])   . Pneumonia    age 28-20  . RENAL INSUFFICIENCY, CHRONIC 03/15/2006  . Tonsillar mass    Left    Patient Active Problem List   Diagnosis Date Noted  . Tubular adenoma of colon 06/30/2016  . Diverticulosis of colon without hemorrhage 06/30/2016  . Postmenopausal vaginal bleeding   . Long term (current) use of opiate analgesic 04/09/2016  . Bilateral carotid artery disease ([place]) 11/28/2015  . Pancreatic insufficiency 05/09/2015  . 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 Risk for coronary artery disease greater than 20% in next 10 years 03/14/2015  . Diastolic dysfunction 03/83/3383  . Vulvar cyst 01/23/2015  . Abnormal uterine bleeding 12/02/2014  . Unexplained night sweats 12/02/2014  . Constipation due to opioid therapy 06/05/2014  . Loss of weight 03/29/2014  . Peripheral arterial disease ([place]) 12/12/2013  . GERD (gastroesophageal reflux disease) 10/12/2012  . Healthcare maintenance 09/29/2012  . Back pain 03/28/2012  . Hyperlipidemia 04/01/2011  . BOILS, RECURRENT 03/13/2010  . Moderate aortic regurgitation 09/25/2008  . HEMORRHOIDS 06/08/2008  . Osteoarthritis 05/06/2007  . TOBACCO ABUSE 09/27/2006  . History of osteoporosis 06/29/2006  . Chronic pancreatitis ([place]) 03/29/2006  . Uncontrolled type 2 diabetes mellitus with chronic kidney disease, without long-term current use of insulin (  [place]) 03/15/2006  . Diabetic neuropathy, painful ([place]) 03/15/2006  . Anemia of chronic disease 03/15/2006  . Essential hypertension 03/15/2006  . VENTRICULAR HYPERTROPHY, LEFT 03/15/2006  . CKD stage 3 due to type 2 diabetes mellitus ([place]) 03/15/2006    Past Surgical History:  Procedure Laterality Date  . BREAST SURGERY    . COLONOSCOPY    . DILATION AND CURETTAGE OF UTERUS N/A 06/16/2016   Procedure: DILATATION AND CURETTAGE;  Surgeon: Mora Bellman, MD;  Location:  [facility];  Service: Gynecology;  Laterality: N/A;  . HYSTEROSCOPY N/A 06/16/2016   Procedure: HYSTEROSCOPY;  Surgeon: Mora Bellman, MD;  Location: [facility];  Service: Gynecology;  Laterality: N/A;  . MOUTH SURGERY N/A   . POLYPECTOMY N/A 06/16/2016   Procedure: POLYPECTOMY;  Surgeon: Mora Bellman, MD;  Location: [facility];  Service: Gynecology;  Laterality: N/A;     OB History    Gravida  3   Para  3   Term  3   Preterm      AB      Living  3     SAB      TAB      Ectopic      Multiple      Live Births  3            Home Medications    Prior to Admission medications   Medication Sig Start Date End Date Taking? Authorizing Provider  amLODipine (NORVASC) 10 MG tablet Take 10 mg by mouth daily. 06/01/17  Yes [provider]  aspirin EC 81 MG tablet Take 1 tablet (81 mg total) by mouth daily. 04/09/16  Yes Lucious Groves, DO  diclofenac sodium (VOLTAREN) 1 % GEL Apply 4 g topically 4 (four) times daily as needed for pain. 06/01/17  Yes [provider]  docusate sodium (COLACE) 100 MG capsule Take 100 mg by mouth daily as needed (for constipation).    Yes [provider]  LANTUS SOLOSTAR 100 UNIT/ML Solostar Pen Inject 18 Units into the skin daily after breakfast.  05/04/17  Yes [provider]  LINZESS 145 MCG CAPS capsule Take 145 mcg by mouth daily.  03/07/17  Yes [provider]  loperamide (IMODIUM) 2 MG capsule Take 1 capsule (2 mg total) 4 (four) times daily as needed by mouth for diarrhea or loose stools. 76/2/83  Yes Delora Fuel, MD  meclizine (ANTIVERT) 25 MG tablet Take 1 tablet (25 mg total) by mouth 3 (three) times daily as needed for dizziness. 1/51/76  Yes Delora Fuel, MD  Multiple Vitamins-Minerals (ONE-A-DAY WOMENS 50+ ADVANTAGE) TABS Take 1 tablet by mouth daily with breakfast.   Yes [provider]  Pancrelipase, Lip-Prot-Amyl, 24000-76000 units CPEP Take 4 capsules (96,000 Units total) by mouth See admin  instructions. Take 4 capsules by mouth 2-3 times daily before meals Patient taking differently: Take 2-4 capsules by mouth See admin instructions. Take 4 capsules by mouth two to three times a day with meals and 2 capsules with any snacks 03/10/16  Yes Lucious Groves, DO  PROAIR HFA 108 (90 Base) MCG/ACT inhaler Inhale 2 puffs into the lungs 4 (four) times daily as needed for wheezing or shortness of breath. 04/30/17  Yes [provider]  rosuvastatin (CRESTOR) 20 MG tablet Take 20 mg by mouth daily. 10/03/17  Yes [provider]  silver sulfADIAZINE (SILVADENE) 1 % cream Apply 1 application topically daily. Apply topically daily to foot. Patient taking differently: Apply 1 application topically See  admin instructions. Apply to bottom of right foot as needed for irritation 08/19/17  Yes Price, Christian Mate, DPM  sitaGLIPtin (JANUVIA) 50 MG tablet Take 1 tablet (50 mg total) by mouth daily. 09/10/16  Yes Lucious Groves, DO  traMADol (ULTRAM) 50 MG tablet Take 1 tablet (50 mg total) by mouth every 6 (six) hours as needed. Patient taking differently: Take 50 mg by mouth every 6 (six) hours as needed for moderate pain.  08/30/17  Yes Daleen Bo, MD  ACCU-CHEK AVIVA PLUS test strip TEST three times a day 11/25/15   Lucious Groves, DO  atorvastatin (LIPITOR) 40 MG tablet Take 1 tablet (40 mg total) by mouth daily. Patient not taking: Reported on 10/05/2017 03/25/16 06/08/18  Lucious Groves, DO  Blood Glucose Monitoring Suppl (ACCU-CHEK AVIVA PLUS) w/Device KIT Use to check you blood sugar 3 times a day 06/04/15   Oval Linsey, MD  HYDROcodone-acetaminophen (NORCO/VICODIN) 5-325 MG tablet Take 1 tablet by mouth every 6 (six) hours as needed for moderate pain. Patient not taking: Reported on 11/22/2017 10/05/17   Milton Ferguson, MD    Family History Family History  Problem Relation Age of Onset  . Dementia Mother   . Colon cancer Neg Hx     Social History Social History   Tobacco Use    . Smoking status: Former Smoker    Packs/day: 0.50    Years: 44.00    Pack years: 22.00    Types: Cigarettes    Last attempt to quit: 01/2017    Years since quitting: 0.8  . Smokeless tobacco: Never Used  . Tobacco comment: tobacco info given 11/26/16  Substance Use Topics  . Alcohol use: Yes    Alcohol/week: 0.0 standard drinks    Comment: occ  . Drug use: No     Allergies   Gabapentin; Atorvastatin; and Nicorette [nicotine]   Review of Systems Review of Systems  Constitutional: Negative for chills and fever.  HENT: Negative for congestion and rhinorrhea.   Eyes: Negative for redness and visual disturbance.  Respiratory: Negative for shortness of breath and wheezing.        Hemoptysis?  Cardiovascular: Negative for chest pain and palpitations.  Gastrointestinal: Negative for abdominal pain, nausea and vomiting.  Genitourinary: Negative for dysuria and urgency.  Musculoskeletal: Negative for arthralgias and myalgias.  Skin: Negative for pallor and wound.  Neurological: Negative for dizziness and headaches.     Physical Exam Updated Vital Signs BP 123/60   Pulse 68   Temp 98.2 F (36.8 C) (Oral)   Resp 16   LMP 02/22/2016   SpO2 100%   Physical Exam  Constitutional: She is oriented to person, place, and time. She appears well-developed and well-nourished. No distress.  HENT:  Head: Normocephalic and atraumatic.  Difficult to see posterior pharynx based on tongue size, she does have some dried blood to the left upper area just posterior to where her dentures are attached to what is likely glue.  No other noted source of bleeding.  Tolerating secretions without difficulty.  Swollen turbinates.  Left TM with a small effusion.  Eyes: Pupils are equal, round, and reactive to light. EOM are normal.  Neck: Normal range of motion. Neck supple.  Cardiovascular: Normal rate and regular rhythm. Exam reveals no gallop and no friction rub.  No murmur  heard. Pulmonary/Chest: Effort normal. She has no wheezes. She has no rales.  Abdominal: Soft. She exhibits distension (mild with + fluid wave). She exhibits no mass.  There is no tenderness. There is no guarding.  Musculoskeletal: She exhibits no edema or tenderness.  Neurological: She is alert and oriented to person, place, and time.  Skin: Skin is warm and dry. She is not diaphoretic.  Psychiatric: She has a normal mood and affect. Her behavior is normal.  Nursing note and vitals reviewed.    ED Treatments / Results  Labs (all labs ordered are listed, but only abnormal results are displayed) Labs Reviewed - No data to display  EKG None  Radiology Dg Chest 2 View  Result Date: 11/22/2017 CLINICAL DATA:  Onset of hemoptysis this morning. History of hypertension, diabetes, CHF, possible pulmonary hypertension, former smoker. EXAM: CHEST - 2 VIEW COMPARISON:  Chest x-ray of October 05, 2017 and chest CT scan of the same date. FINDINGS: The lungs are well-expanded. The interstitial markings are coarse. There is linear increased density at the left lung base consistent with scarring. No pulmonary nodules or masses are observed. There is no alveolar infiltrate or pleural effusion. The heart and pulmonary vascularity are normal. The trachea is midline. There is calcification in the wall of the aortic arch. There is mild multilevel degenerative disc disease of the thoracic spine. IMPRESSION: Chronic bronchitic-smoking related changes. No pneumonia, CHF, nor other acute cardiopulmonary abnormality. Thoracic aortic atherosclerosis. Electronically Signed   By: David  Martinique M.D.   On: 11/22/2017 09:58    Procedures Procedures (including critical care time)  Medications Ordered in ED Medications - No data to display   Initial Impression / Assessment and Plan / ED Course  I have reviewed the triage vital signs and the nursing notes.  Pertinent labs & imaging results that were available during  my care of the patient were reviewed by me and considered in my medical decision making (see chart for details).     67 yo F with a chief complaint of blood in her mouth.  I am not sure the actual source.  She does have a small amount of dried blood attached to the glue that is attached to her dentures.  She is not actively bleeding.  She is not having cough or chest pain or shortness of breath.  She does have signs of URI and so I suspect that the patient had more sinus drainage that appeared to be red that she had spit out this morning.  She is requesting a chest x-ray which we will perform and give this time to observe her for short time in the ED.  No continued bleeding in the ED.  Chest x-ray reviewed by me without concerning finding.  Will discharge home.  1:54 PM:  I have discussed the diagnosis/risks/treatment options with the patient and believe the pt to be eligible for discharge home to follow-up with PCP, ENT. We also discussed returning to the ED immediately if new or worsening sx occur. We discussed the sx which are most concerning (e.g., sudden worsening pain, fever, inability to tolerate by mouth) that necessitate immediate return. Medications administered to the patient during their visit and any new prescriptions provided to the patient are listed below.  Medications given during this visit Medications - No data to display    The patient appears reasonably screen and/or stabilized for discharge and I doubt any other medical condition or other [facility] requiring further screening, evaluation, or treatment in the ED at this time prior to discharge.    Final Clinical Impressions(s) / ED Diagnoses   Final diagnoses:  Blood in sputum  ED Discharge Orders    None       Deno Etienne, [place] 11/22/17 1354

## 2017-12-03 ENCOUNTER — Ambulatory Visit (INDEPENDENT_AMBULATORY_CARE_PROVIDER_SITE_OTHER): Payer: Medicare Other | Admitting: Podiatry

## 2017-12-03 DIAGNOSIS — E08621 Diabetes mellitus due to underlying condition with foot ulcer: Secondary | ICD-10-CM | POA: Diagnosis not present

## 2017-12-03 DIAGNOSIS — L97411 Non-pressure chronic ulcer of right heel and midfoot limited to breakdown of skin: Secondary | ICD-10-CM | POA: Diagnosis not present

## 2017-12-03 MED ORDER — NONFORMULARY OR COMPOUNDED ITEM: 1.0000 g | Freq: Four times a day (QID) | 2 refills | Status: DC

## 2017-12-23 NOTE — Progress Notes (Signed)
Subjective:  Patient ID: [name], female    DOB: [birth year],  MRN: [id number]  Chief Complaint  Patient presents with  . Wound Check    1 month wound care right foot.   . Callouses    right foot submet hallux medial side, right foot submet 5th toe lateral side, left foot submet halllux medial side    67 y.o. female presents with the above complaint.   Review of Systems: Negative except as noted in the HPI. Denies N/V/F/Ch.  Past Medical History:  Diagnosis Date  . ANEMIA, CHRONIC DISEASE NEC 03/15/2006  . Cirrhosis ([place])   . Colon polyps   . Constipation due to opioid therapy 06/05/2014  . DEGENERATIVE JOINT DISEASE 05/06/2007   On going for >5 yrs No imaging to confirm Has tried ultram, mobic, neurontin which did not work Ambulance person tried once worked well    . Diabetes mellitus    Type II  . DIABETIC  RETINOPATHY 03/15/2006  . 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 Diastolic dysfunction 66/29/4765   Grade 1 by Echocardiogram 12/13/14  . Heart murmur   . Hemorrhoids   . History of kidney stones   . Hyperlipidemia 04/01/2011  . Hypertension   . Moderate aortic regurgitation 09/25/2008   12/13/14 Echocardiogram    . PANCREATITIS, CHRONIC 03/29/2006  . Peripheral arterial disease ([place])   . Pneumonia    age 15-20  . RENAL INSUFFICIENCY, CHRONIC 03/15/2006  . Tonsillar mass    Left    Current Outpatient Medications:  .  ACCU-CHEK AVIVA PLUS test strip, TEST three times a day, Disp: 100 each, Rfl: 11 .  amLODipine (NORVASC) 10 MG tablet, Take 10 mg by mouth daily., Disp: , Rfl:  .  aspirin EC 81 MG tablet, Take 1 tablet (81 mg total) by mouth daily., Disp: , Rfl:  .  atorvastatin (LIPITOR) 40 MG tablet, Take 1 tablet (40 mg total) by mouth daily., Disp: 90 tablet, Rfl: 3 .  Blood Glucose Monitoring Suppl (ACCU-CHEK AVIVA PLUS) w/Device KIT, Use to check you blood sugar 3 times a day, Disp: 1 kit, Rfl: 1 .  diclofenac sodium (VOLTAREN) 1 % GEL, Apply 4 g topically 4 (four)  times daily as needed for pain., Disp: , Rfl: 5 .  docusate sodium (COLACE) 100 MG capsule, Take 100 mg by mouth daily as needed (for constipation). , Disp: , Rfl:  .  HYDROcodone-acetaminophen (NORCO/VICODIN) 5-325 MG tablet, Take 1 tablet by mouth [name] 6 (six) hours as needed for moderate pain., Disp: 15 tablet, Rfl: 0 .  LANTUS SOLOSTAR 100 UNIT/ML Solostar Pen, Inject 18 Units into the skin daily after breakfast. , Disp: , Rfl: 5 .  LINZESS 145 MCG CAPS capsule, Take 145 mcg by mouth daily. , Disp: , Rfl:  .  loperamide (IMODIUM) 2 MG capsule, Take 1 capsule (2 mg total) 4 (four) times daily as needed by mouth for diarrhea or loose stools., Disp: 12 capsule, Rfl: 0 .  meclizine (ANTIVERT) 25 MG tablet, Take 1 tablet (25 mg total) by mouth 3 (three) times daily as needed for dizziness., Disp: 30 tablet, Rfl: 0 .  Multiple Vitamins-Minerals (ONE-A-DAY WOMENS 50+ ADVANTAGE) TABS, Take 1 tablet by mouth daily with breakfast., Disp: , Rfl:  .  Pancrelipase, Lip-Prot-Amyl, 24000-76000 units CPEP, Take 4 capsules (96,000 Units total) by mouth See admin instructions. Take 4 capsules by mouth 2-3 times daily before meals (Patient taking differently: Take 2-4 capsules by mouth See admin instructions. Take 4 capsules by  mouth two to three times a day with meals and 2 capsules with any snacks), Disp: 1170 capsule, Rfl: 3 .  PROAIR HFA 108 (90 Base) MCG/ACT inhaler, Inhale 2 puffs into the lungs 4 (four) times daily as needed for wheezing or shortness of breath., Disp: , Rfl: 3 .  rosuvastatin (CRESTOR) 20 MG tablet, Take 20 mg by mouth daily., Disp: , Rfl:  .  silver sulfADIAZINE (SILVADENE) 1 % cream, Apply 1 application topically daily. Apply topically daily to foot. (Patient taking differently: Apply 1 application topically See admin instructions. Apply to bottom of right foot as needed for irritation), Disp: 20 g, Rfl: 1 .  sitaGLIPtin (JANUVIA) 50 MG tablet, Take 1 tablet (50 mg total) by mouth daily.,  Disp: 12 tablet, Rfl: 0 .  traMADol (ULTRAM) 50 MG tablet, Take 1 tablet (50 mg total) by mouth [name] 6 (six) hours as needed. (Patient taking differently: Take 50 mg by mouth [name] 6 (six) hours as needed for moderate pain. ), Disp: 15 tablet, Rfl: 0 .  NONFORMULARY OR COMPOUNDED ITEM, Apply 1-2 g topically 4 (four) times daily. [facility]  Peripheral Neuropathy Cream- Bupivacaine 1%, Doxepin 3%, Gabapentin 6%, Pentoxifylline 3%, Topiramate 1% Apply 1-2 grams to affected area 3-4 times daily Qty. 120 gm 3 refills, Disp: 120 each, Rfl: 2  Social History   Tobacco Use  Smoking Status Former Smoker  . Packs/day: 0.50  . Years: 44.00  . Pack years: 22.00  . Types: Cigarettes  . Last attempt to quit: 01/2017  . Years since quitting: 0.9  Smokeless Tobacco Never Used  Tobacco Comment   tobacco info given 11/26/16    Allergies  Allergen Reactions  . Gabapentin Swelling    Legs and feet swell  . Atorvastatin Other (See Comments)    Caused muscles in her back to be very sore  . Nicorette [Nicotine] Nausea Only   Objective:  There were no vitals filed for this visit. There is no height or weight on file to calculate BMI. Constitutional Well developed. Well nourished.  Vascular Dorsalis pedis pulses palpable bilaterally. Posterior tibial pulses non-palpable bilaterally. Capillary refill normal to all digits.  No cyanosis or clubbing noted. Pedal hair growth normal.  Neurologic Normal speech. Oriented to person, place, and time. Epicritic sensation to light touch grossly present bilaterally.  Dermatologic Nails well groomed and normal in appearance. No skin lesions. Would R 5th MPJ 0.2x0.2 post-debridement  Orthopedic: Normal joint ROM without pain or crepitus bilaterally. No visible deformities. Pain on palpation right fifth MPJ   Radiographs: None today Assessment:   1. Diabetic ulcer of right midfoot associated with diabetes mellitus due to underlying condition,  limited to breakdown of skin [facility])    Plan:  Patient was evaluated and treated and all questions answered.  Ulcer R 5th MPJ -Debrided today.  See below -Dressed with medihoney and dry sterile dressing. Will still consider surgical excision of the fifth metatarsal head pending lowering of patient's A1c.  Procedure: Excisional Debridement of Wound Rationale: Removal of non-viable soft tissue from the wound to promote healing.  Anesthesia: none Pre-Debridement Wound Measurements: overlying hpk Post-Debridement Wound Measurements: 0.2 cm x 0.2 cm x 0.1 cm  Type of Debridement: Sharp Excisional Tissue Removed: Non-viable soft tissue Depth of Debridement: subcutaneous tissue. Technique: Sharp excisional debridement to bleeding, viable wound base.  Dressing: Dry, sterile, compression dressing. Disposition: Patient tolerated procedure well. Patient to return in 1 week for follow-up.    Return in about 1 month (around  01/03/2018) for Wound Care, Right.

## 2017-12-27 ENCOUNTER — Ambulatory Visit: Payer: Medicare Other | Admitting: Internal Medicine

## 2017-12-31 ENCOUNTER — Ambulatory Visit: Payer: Medicare Other | Admitting: Podiatry

## 2018-01-13 ENCOUNTER — Encounter

## 2018-01-13 ENCOUNTER — Encounter: Payer: Self-pay | Admitting: Podiatry

## 2018-01-13 ENCOUNTER — Ambulatory Visit (INDEPENDENT_AMBULATORY_CARE_PROVIDER_SITE_OTHER): Payer: Medicare Other | Admitting: Podiatry

## 2018-01-13 DIAGNOSIS — Q828 Other specified congenital malformations of skin: Secondary | ICD-10-CM

## 2018-01-13 DIAGNOSIS — E1142 Type 2 diabetes mellitus with diabetic polyneuropathy: Secondary | ICD-10-CM

## 2018-01-13 DIAGNOSIS — L97411 Non-pressure chronic ulcer of right heel and midfoot limited to breakdown of skin: Secondary | ICD-10-CM

## 2018-01-13 DIAGNOSIS — E08621 Diabetes mellitus due to underlying condition with foot ulcer: Secondary | ICD-10-CM | POA: Diagnosis not present

## 2018-01-16 NOTE — Progress Notes (Signed)
Subjective:  Patient ID: [name], female    DOB: [birth year],  MRN: [id number]  Chief Complaint  Patient presents with  . Diabetic Ulcer    right foot, 4 week follow up    67 y.o. female presents with the above complaint.   Review of Systems: Negative except as noted in the HPI. Denies N/V/F/Ch.  Past Medical History:  Diagnosis Date  . ANEMIA, CHRONIC DISEASE NEC 03/15/2006  . Cirrhosis ([place])   . Colon polyps   . Constipation due to opioid therapy 06/05/2014  . DEGENERATIVE JOINT DISEASE 05/06/2007   On going for >5 yrs No imaging to confirm Has tried ultram, mobic, neurontin which did not work Ambulance person tried once worked well    . Diabetes mellitus    Type II  . DIABETIC  RETINOPATHY 03/15/2006  . 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 Diastolic dysfunction 02/40/9735   Grade 1 by Echocardiogram 12/13/14  . Heart murmur   . Hemorrhoids   . History of kidney stones   . Hyperlipidemia 04/01/2011  . Hypertension   . Moderate aortic regurgitation 09/25/2008   12/13/14 Echocardiogram    . PANCREATITIS, CHRONIC 03/29/2006  . Peripheral arterial disease ([place])   . Pneumonia    age 55-20  . RENAL INSUFFICIENCY, CHRONIC 03/15/2006  . Tonsillar mass    Left    Current Outpatient Medications:  .  ACCU-CHEK AVIVA PLUS test strip, TEST three times a day, Disp: 100 each, Rfl: 11 .  amLODipine (NORVASC) 10 MG tablet, Take 10 mg by mouth daily., Disp: , Rfl:  .  aspirin EC 81 MG tablet, Take 1 tablet (81 mg total) by mouth daily., Disp: , Rfl:  .  atorvastatin (LIPITOR) 40 MG tablet, Take 1 tablet (40 mg total) by mouth daily., Disp: 90 tablet, Rfl: 3 .  Blood Glucose Monitoring Suppl (ACCU-CHEK AVIVA PLUS) w/Device KIT, Use to check you blood sugar 3 times a day, Disp: 1 kit, Rfl: 1 .  diclofenac sodium (VOLTAREN) 1 % GEL, Apply 4 g topically 4 (four) times daily as needed for pain., Disp: , Rfl: 5 .  docusate sodium (COLACE) 100 MG capsule, Take 100 mg by mouth daily as needed  (for constipation). , Disp: , Rfl:  .  HYDROcodone-acetaminophen (NORCO/VICODIN) 5-325 MG tablet, Take 1 tablet by mouth [name] 6 (six) hours as needed for moderate pain., Disp: 15 tablet, Rfl: 0 .  LANTUS SOLOSTAR 100 UNIT/ML Solostar Pen, Inject 18 Units into the skin daily after breakfast. , Disp: , Rfl: 5 .  LINZESS 145 MCG CAPS capsule, Take 145 mcg by mouth daily. , Disp: , Rfl:  .  loperamide (IMODIUM) 2 MG capsule, Take 1 capsule (2 mg total) 4 (four) times daily as needed by mouth for diarrhea or loose stools., Disp: 12 capsule, Rfl: 0 .  meclizine (ANTIVERT) 25 MG tablet, Take 1 tablet (25 mg total) by mouth 3 (three) times daily as needed for dizziness., Disp: 30 tablet, Rfl: 0 .  Multiple Vitamins-Minerals (ONE-A-DAY WOMENS 50+ ADVANTAGE) TABS, Take 1 tablet by mouth daily with breakfast., Disp: , Rfl:  .  NONFORMULARY OR COMPOUNDED ITEM, Apply 1-2 g topically 4 (four) times daily. [facility]  Peripheral Neuropathy Cream- Bupivacaine 1%, Doxepin 3%, Gabapentin 6%, Pentoxifylline 3%, Topiramate 1% Apply 1-2 grams to affected area 3-4 times daily Qty. 120 gm 3 refills, Disp: 120 each, Rfl: 2 .  omeprazole (PRILOSEC) 40 MG capsule, TK ONE C PO QD TAKE ON EMPTY STOMACH 30 TO 45 MINUTES  BEFORE YOUR FIRST MEAL, Disp: , Rfl: 3 .  Pancrelipase, Lip-Prot-Amyl, 24000-76000 units CPEP, Take 4 capsules (96,000 Units total) by mouth See admin instructions. Take 4 capsules by mouth 2-3 times daily before meals (Patient taking differently: Take 2-4 capsules by mouth See admin instructions. Take 4 capsules by mouth two to three times a day with meals and 2 capsules with any snacks), Disp: 1170 capsule, Rfl: 3 .  PROAIR HFA 108 (90 Base) MCG/ACT inhaler, Inhale 2 puffs into the lungs 4 (four) times daily as needed for wheezing or shortness of breath., Disp: , Rfl: 3 .  ranitidine (ZANTAC) 150 MG tablet, , Disp: , Rfl: 3 .  rosuvastatin (CRESTOR) 20 MG tablet, Take 20 mg by mouth daily., Disp: , Rfl:  .   silver sulfADIAZINE (SILVADENE) 1 % cream, Apply 1 application topically daily. Apply topically daily to foot. (Patient taking differently: Apply 1 application topically See admin instructions. Apply to bottom of right foot as needed for irritation), Disp: 20 g, Rfl: 1 .  sitaGLIPtin (JANUVIA) 50 MG tablet, Take 1 tablet (50 mg total) by mouth daily., Disp: 12 tablet, Rfl: 0 .  traMADol (ULTRAM) 50 MG tablet, Take 1 tablet (50 mg total) by mouth [name] 6 (six) hours as needed. (Patient taking differently: Take 50 mg by mouth [name] 6 (six) hours as needed for moderate pain. ), Disp: 15 tablet, Rfl: 0  Social History   Tobacco Use  Smoking Status Former Smoker  . Packs/day: 0.50  . Years: 44.00  . Pack years: 22.00  . Types: Cigarettes  . Last attempt to quit: 01/2017  . Years since quitting: 0.9  Smokeless Tobacco Never Used  Tobacco Comment   tobacco info given 11/26/16    Allergies  Allergen Reactions  . Gabapentin Swelling    Legs and feet swell  . Atorvastatin Other (See Comments)    Caused muscles in her back to be very sore  . Nicorette [Nicotine] Nausea Only   Objective:  There were no vitals filed for this visit. There is no height or weight on file to calculate BMI. Constitutional Well developed. Well nourished.  Vascular Dorsalis pedis pulses palpable bilaterally. Posterior tibial pulses non-palpable bilaterally. Capillary refill normal to all digits.  No cyanosis or clubbing noted. Pedal hair growth normal.  Neurologic Normal speech. Oriented to person, place, and time. Epicritic sensation to light touch grossly present bilaterally.  Dermatologic Nails well groomed and normal in appearance. No skin lesions. Would R 5th MPJ 0.1x0.1 post-debridement Punctate keratoses sub-met 1 5 heel left, first MPJ right  Orthopedic: Normal joint ROM without pain or crepitus bilaterally. No visible deformities. Pain on palpation right fifth MPJ   Radiographs: None  today Assessment:   1. Diabetic ulcer of right midfoot associated with diabetes mellitus due to underlying condition, limited to breakdown of skin [facility])    Plan:  Patient was evaluated and treated and all questions answered.  Ulcer R 5th MPJ -Debrided today with only small open area of ulceration -Wishes to defer surgical excision of the metatarsal head at this time  Procedure: Selective Debridement of Wound Rationale: Removal of devitalized tissue from the wound to promote healing.  Pre-Debridement Wound Measurements: 0.1 cm x 0.1 cm x 0.1 cm  Post-Debridement Wound Measurements: same as pre-debridement. Type of Debridement: sharp selective Tissue Removed: Devitalized soft-tissue Dressing: Dry, sterile, compression dressing. Disposition: Patient tolerated procedure well. Patient to return in 1 week for follow-up.   Porokeratoses -Debrided with 15 blade  Procedure:  Paring of Lesion Rationale: painful hyperkeratotic lesion Type of Debridement: manual, sharp debridement. Instrumentation: 312 blade Number of Lesions: 4    Return in about 4 weeks (around 02/10/2018) for Wound Care bilateral feet.

## 2018-01-18 ENCOUNTER — Ambulatory Visit (INDEPENDENT_AMBULATORY_CARE_PROVIDER_SITE_OTHER): Payer: Medicare Other | Admitting: Internal Medicine

## 2018-01-18 ENCOUNTER — Encounter: Payer: Self-pay | Admitting: Internal Medicine

## 2018-01-18 VITALS — BP 120/60 | HR 80 | Ht 65.0 in | Wt 135.0 lb

## 2018-01-18 DIAGNOSIS — R1032 Left lower quadrant pain: Secondary | ICD-10-CM

## 2018-01-18 DIAGNOSIS — R945 Abnormal results of liver function studies: Secondary | ICD-10-CM

## 2018-01-18 DIAGNOSIS — K861 Other chronic pancreatitis: Secondary | ICD-10-CM | POA: Diagnosis not present

## 2018-01-18 DIAGNOSIS — R7989 Other specified abnormal findings of blood chemistry: Secondary | ICD-10-CM

## 2018-01-18 DIAGNOSIS — K5900 Constipation, unspecified: Secondary | ICD-10-CM

## 2018-01-18 DIAGNOSIS — K219 Gastro-esophageal reflux disease without esophagitis: Secondary | ICD-10-CM

## 2018-01-18 DIAGNOSIS — K7030 Alcoholic cirrhosis of liver without ascites: Secondary | ICD-10-CM | POA: Diagnosis not present

## 2018-01-18 DIAGNOSIS — K8689 Other specified diseases of pancreas: Secondary | ICD-10-CM

## 2018-01-18 MED ORDER — RANITIDINE HCL 150 MG PO TABS: 150.0000 mg | ORAL_TABLET | Freq: Every day | ORAL | 3 refills | Status: DC

## 2018-01-18 MED ORDER — OMEPRAZOLE 40 MG PO CPDR: DELAYED_RELEASE_CAPSULE | ORAL | 3 refills | Status: DC

## 2018-01-18 NOTE — Patient Instructions (Signed)
We have sent the following medications to your pharmacy for you to pick up at your convenience: Omeprazole, Zantac  Please follow up in a year

## 2018-01-18 NOTE — Progress Notes (Signed)
HISTORY OF PRESENT ILLNESS:  [name] is a 67 y.o. female with multiple significant medical problems and multiple GI diagnoses who is sent today at the recommendation of her ear nose and throat specialist Dr. Wellington Hampshire regarding GERD.  The patient was being evaluated by ENT for chronic throat pain.  Problems felt to be secondary to GERD.  The patient does not know why she is here.  She tells me that she takes a combination of omeprazole and ranitidine for her reflux symptoms.  No classic symptoms on medication.  She does request refills.  Other GI diagnoses include alcoholic cirrhosis with abnormal liver test secondary to the same and imaging demonstrating regenerative nodules.  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  She also has a history of adenomatous colon polyps with last colonoscopy May 2018.  She does take pancreatic enzyme supplementation.  This helps.  She tells me today that she has had 1 days worth of abdominal pain.  This occurred last night.  Described as severe.  Has improved currently after several bowel movements.  No additional complaints.  She has been under a great deal of stress having lost 1 sister last year and another sister struggling with cancer currently.  On the other hand, she tells me that she has been abstinent from alcohol since January of this year and has not smoked since February of this year.  Review of blood work from August 2019 finds markedly elevated glucose of 560.  CBC with anemia.  Hemoglobin 9.9.  MCV 90.3.  Platelets 145,000.  White blood cell count 3.8.  REVIEW OF SYSTEMS:  All non-GI ROS negative as otherwise stated in the HPI except for arthritis, back pain, visual change, headaches, swelling, heart murmur  Past Medical History:  Diagnosis Date  . ANEMIA, CHRONIC DISEASE NEC 03/15/2006  . Cirrhosis ([place])   . Colon polyps   . Constipation due to opioid therapy 06/05/2014  . DEGENERATIVE JOINT DISEASE  05/06/2007   On going for >5 yrs No imaging to confirm Has tried ultram, mobic, neurontin which did not work Ambulance person tried once worked well    . Diabetes mellitus    Type II  . DIABETIC  RETINOPATHY 03/15/2006  . 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 Diastolic dysfunction 38/75/6433   Grade 1 by Echocardiogram 12/13/14  . Heart murmur   . Hemorrhoids   . History of kidney stones   . Hyperlipidemia 04/01/2011  . Hypertension   . Moderate aortic regurgitation 09/25/2008   12/13/14 Echocardiogram    . PANCREATITIS, CHRONIC 03/29/2006  . Peripheral arterial disease ([place])   . Pneumonia    age [age]  . RENAL INSUFFICIENCY, CHRONIC 03/15/2006  . Tonsillar mass    Left    Past Surgical History:  Procedure Laterality Date  . BREAST SURGERY    . COLONOSCOPY    . DILATION AND CURETTAGE OF UTERUS N/A 06/16/2016   Procedure: DILATATION AND CURETTAGE;  Surgeon: Mora Bellman, MD;  Location: [facility];  Service: Gynecology;  Laterality: N/A;  . HYSTEROSCOPY N/A 06/16/2016   Procedure: HYSTEROSCOPY;  Surgeon: Mora Bellman, MD;  Location: [facility];  Service: Gynecology;  Laterality: N/A;  . MOUTH SURGERY N/A   . POLYPECTOMY N/A 06/16/2016   Procedure: POLYPECTOMY;  Surgeon: Mora Bellman, MD;  Location: [facility];  Service: Gynecology;  Laterality: N/A;    Social History [name]  reports that she quit smoking about a year ago. Her smoking use included cigarettes. She has  a 22.00 pack-year smoking history. She has never used smokeless tobacco. She reports that she drinks alcohol. She reports that she does not use drugs.  family history includes Dementia in her mother.  Allergies  Allergen Reactions  . Gabapentin Swelling    Legs and feet swell  . Atorvastatin Other (See Comments)    Caused muscles in her back to be very sore  . Nicorette [Nicotine] Nausea Only       PHYSICAL EXAMINATION: Vital signs: BP 120/60   Pulse 80   Ht 5' 5"  (1.651 m)   Wt 135 lb (61.2 kg)   LMP 02/22/2016    BMI 22.47 kg/m   Constitutional: generally well-appearing, no acute distress Psychiatric: alert and oriented x3, cooperative Eyes: extraocular movements intact, anicteric, conjunctiva pink Mouth: oral pharynx moist, no lesions Neck: supple no lymphadenopathy Cardiovascular: heart regular rate and rhythm, systolic murmur Lungs: clear to auscultation bilaterally Abdomen: soft, nontender, nondistended, no obvious ascites, no peritoneal signs, normal bowel sounds, no organomegaly Rectal: Omitted Extremities: no clubbing, cyanosis, or lower extremity edema bilaterally Skin: no lesions on visible extremities Neuro: No focal deficits. No asterixis.    ASSESSMENT:  1.  GERD.  Classic symptoms controlled with PPI and H2 receptor antagonist therapy 2.  Chronic complaints of throat discomfort.  Negative ENT evaluation.  I am not convinced this is GERD 3.  Compensated hepatic cirrhosis and elevated liver test secondary to alcohol.  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 4.  Chronic calcific pancreatitis with a history of pancreatic insufficiency 5.  Adenomatous colon polyps on colonoscopy May 2018 6.  Transient problems with abdominal pain secondary to constipation   PLAN:  1.  Reflux precautions 2.  Refill PPI 3.  Refill H2 receptor antagonist therapy 4.  Continue abstinence from alcohol 5.  Continue abstinence from tobacco 6.  Pancreatic enzymes as needed 7.  Routine office follow-up 1 year 8.  Surveillance colonoscopy around 2023 9.  Resume MiraLAX for chronic constipation  25-minute spent face-to-face with the patient.  Greater than 50% the time is for counseling regarding her myriad of GI diagnoses and their management

## 2018-02-10 ENCOUNTER — Ambulatory Visit (INDEPENDENT_AMBULATORY_CARE_PROVIDER_SITE_OTHER): Payer: Medicare Other | Admitting: Podiatry

## 2018-02-10 ENCOUNTER — Encounter: Payer: Self-pay | Admitting: Podiatry

## 2018-02-10 DIAGNOSIS — M779 Enthesopathy, unspecified: Secondary | ICD-10-CM

## 2018-02-10 DIAGNOSIS — E08621 Diabetes mellitus due to underlying condition with foot ulcer: Secondary | ICD-10-CM

## 2018-02-10 DIAGNOSIS — M21961 Unspecified acquired deformity of right lower leg: Secondary | ICD-10-CM

## 2018-02-10 DIAGNOSIS — L97411 Non-pressure chronic ulcer of right heel and midfoot limited to breakdown of skin: Secondary | ICD-10-CM

## 2018-02-10 DIAGNOSIS — E1142 Type 2 diabetes mellitus with diabetic polyneuropathy: Secondary | ICD-10-CM | POA: Diagnosis not present

## 2018-02-10 NOTE — Patient Instructions (Signed)
Pre-Operative Instructions  Congratulations, you have decided to take an important step towards improving your quality of life.  You can be assured that the doctors and staff at [facility] will be with you every step of the way.  Here are some important things you should know:  1. Plan to be at the surgery center/hospital at least 1 (one) hour prior to your scheduled time, unless otherwise directed by the surgical center/hospital staff.  You must have a responsible adult accompany you, remain during the surgery and drive you home.  Make sure you have directions to the surgical center/hospital to ensure you arrive on time. 2. If you are having surgery at [facility] or [facility] hospitals, you will need a copy of your medical history and physical form from your family physician within one month prior to the date of surgery. We will give you a form for your primary physician to complete.  3. We make every effort to accommodate the date you request for surgery.  However, there are times where surgery dates or times have to be moved.  We will contact you as soon as possible if a change in schedule is required.   4. No aspirin/ibuprofen for one week before surgery.  If you are on aspirin, any non-steroidal anti-inflammatory medications (Mobic, Aleve, Ibuprofen) should not be taken seven (7) days prior to your surgery.  You make take Tylenol for pain prior to surgery.  5. Medications - If you are taking daily heart and blood pressure medications, seizure, reflux, allergy, asthma, anxiety, pain or diabetes medications, make sure you notify the surgery center/hospital before the day of surgery so they can tell you which medications you should take or avoid the day of surgery. 6. No food or drink after midnight the night before surgery unless directed otherwise by surgical center/hospital staff. 7. No alcoholic beverages 24-hours prior to surgery.  No smoking 24-hours prior or 24-hours after  surgery. 8. Wear loose pants or shorts. They should be loose enough to fit over bandages, boots, and casts. 9. Don't wear slip-on shoes. Sneakers are preferred. 10. Bring your boot with you to the surgery center/hospital.  Also bring crutches or a walker if your physician has prescribed it for you.  If you do not have this equipment, it will be provided for you after surgery. 11. If you have not been contacted by the surgery center/hospital by the day before your surgery, call to confirm the date and time of your surgery. 12. Leave-time from work may vary depending on the type of surgery you have.  Appropriate arrangements should be made prior to surgery with your employer. 13. Prescriptions will be provided immediately following surgery by your doctor.  Fill these as soon as possible after surgery and take the medication as directed. Pain medications will not be refilled on weekends and must be approved by the doctor. 14. Remove nail polish on the operative foot and avoid getting pedicures prior to surgery. 15. Wash the night before surgery.  The night before surgery wash the foot and leg well with water and the antibacterial soap provided. Be sure to pay special attention to beneath the toenails and in between the toes.  Wash for at least three (3) minutes. Rinse thoroughly with water and dry well with a towel.  Perform this wash unless told not to do so by your physician.  Enclosed: 1 Ice pack (please put in freezer the night before surgery)   1 Hibiclens skin cleaner     Pre-op instructions  If you have any questions regarding the instructions, please do not hesitate to call our office.  [place]: [address], , [place] [address] -- [phone]  [place]: [address] [place], [place] [address] -- [phone]  [place]: [address]  [place], [place] [address] -- [phone]  [place]: [address], [address], [place],  [address] -- [phone]  Website:  [email or web address] 

## 2018-02-26 ENCOUNTER — Encounter (HOSPITAL_COMMUNITY): Payer: Self-pay

## 2018-02-26 ENCOUNTER — Emergency Department (HOSPITAL_COMMUNITY): Payer: Medicare Other

## 2018-02-26 ENCOUNTER — Emergency Department (HOSPITAL_COMMUNITY)
Admission: EM | Admit: 2018-02-26 | Discharge: 2018-02-27 | Disposition: A | Discharge status: Home / Self Care | Payer: Medicare Other | Attending: Emergency Medicine | Admitting: Emergency Medicine

## 2018-02-26 DIAGNOSIS — Z794 Long term (current) use of insulin: Secondary | ICD-10-CM | POA: Diagnosis not present

## 2018-02-26 DIAGNOSIS — Z7982 Long term (current) use of aspirin: Secondary | ICD-10-CM | POA: Insufficient documentation

## 2018-02-26 DIAGNOSIS — F17210 Nicotine dependence, cigarettes, uncomplicated: Secondary | ICD-10-CM | POA: Insufficient documentation

## 2018-02-26 DIAGNOSIS — I129 Hypertensive chronic kidney disease with stage 1 through stage 4 chronic kidney disease, or unspecified chronic kidney disease: Secondary | ICD-10-CM | POA: Insufficient documentation

## 2018-02-26 DIAGNOSIS — M546 Pain in thoracic spine: Secondary | ICD-10-CM | POA: Insufficient documentation

## 2018-02-26 DIAGNOSIS — E1122 Type 2 diabetes mellitus with diabetic chronic kidney disease: Secondary | ICD-10-CM | POA: Diagnosis not present

## 2018-02-26 DIAGNOSIS — Z79899 Other long term (current) drug therapy: Secondary | ICD-10-CM | POA: Insufficient documentation

## 2018-02-26 DIAGNOSIS — M549 Dorsalgia, unspecified: Secondary | ICD-10-CM

## 2018-02-26 DIAGNOSIS — N183 Chronic kidney disease, stage 3 (moderate): Secondary | ICD-10-CM | POA: Insufficient documentation

## 2018-02-26 LAB — BASIC METABOLIC PANEL
Anion gap: 6 (ref 5–15)
BUN: 20 mg/dL (ref 8–23)
CO2: 19 mmol/L — ABNORMAL LOW (ref 22–32)
Calcium: 8.2 mg/dL — ABNORMAL LOW (ref 8.9–10.3)
Chloride: 106 mmol/L (ref 98–111)
Creatinine, Ser: 1.62 mg/dL — ABNORMAL HIGH (ref 0.44–1.00)
GFR calc Af Amer: 38 mL/min — ABNORMAL LOW (ref >60)
GFR calc non Af Amer: 33 mL/min — ABNORMAL LOW (ref >60)
Glucose, Bld: 158 mg/dL — ABNORMAL HIGH (ref 70–99)
Potassium: 3.7 mmol/L (ref 3.5–5.1)
Sodium: 131 mmol/L — ABNORMAL LOW (ref 135–145)

## 2018-02-26 LAB — CBC
HCT: 30.7 % — ABNORMAL LOW (ref 36.0–46.0)
Hemoglobin: 9.6 g/dL — ABNORMAL LOW (ref 12.0–15.0)
MCH: 27.3 pg (ref 26.0–34.0)
MCHC: 31.3 g/dL (ref 30.0–36.0)
MCV: 87.2 fL (ref 80.0–100.0)
Platelets: 143 10*3/uL — ABNORMAL LOW (ref 150–400)
RBC: 3.52 MIL/uL — ABNORMAL LOW (ref 3.87–5.11)
RDW: 16.6 % — ABNORMAL HIGH (ref 11.5–15.5)
WBC: 3.2 10*3/uL — ABNORMAL LOW (ref 4.0–10.5)
nRBC: 0 % (ref 0.0–0.2)

## 2018-02-26 LAB — I-STAT TROPONIN, ED: Troponin i, poc: 0 ng/mL (ref 0.00–0.08)

## 2018-02-26 NOTE — ED Triage Notes (Signed)
Onset 1 week chest pain and mid back pain.  No c/o shortness of breath.  Pt states she started back smoking  About 1-2 months ago.

## 2018-02-27 DIAGNOSIS — M546 Pain in thoracic spine: Secondary | ICD-10-CM | POA: Diagnosis not present

## 2018-02-27 LAB — CBG MONITORING, ED
Glucose-Capillary: 48 mg/dL — ABNORMAL LOW (ref 70–99)
Glucose-Capillary: 129 mg/dL — ABNORMAL HIGH (ref 70–99)

## 2018-02-27 MED ORDER — CYCLOBENZAPRINE HCL 10 MG PO TABS: 10.0000 mg | ORAL_TABLET | Freq: Three times a day (TID) | ORAL | 0 refills | Status: DC | PRN

## 2018-02-27 MED ORDER — HYDROCODONE-ACETAMINOPHEN 5-325 MG PO TABS: 1.0000 | ORAL_TABLET | Freq: Four times a day (QID) | ORAL | 0 refills | Status: DC | PRN

## 2018-02-27 MED ORDER — OXYCODONE-ACETAMINOPHEN 5-325 MG PO TABS
1.0000 | ORAL_TABLET | Freq: Once | ORAL | Status: AC
  Administered 2018-02-27: 1 via ORAL
  Filled 2018-02-27: qty 1

## 2018-02-27 NOTE — ED Notes (Signed)
Pt ambulated to the bathroom with no problems.

## 2018-02-27 NOTE — ED Notes (Signed)
Pt reports having lower back pain and right scapula pain. Pt reports this has been going on for 3 days now.

## 2018-03-10 ENCOUNTER — Ambulatory Visit: Payer: Medicaid Other | Admitting: Podiatry

## 2018-03-13 ENCOUNTER — Encounter (HOSPITAL_COMMUNITY): Payer: Self-pay

## 2018-03-13 ENCOUNTER — Emergency Department (HOSPITAL_COMMUNITY): Payer: Medicare Other

## 2018-03-13 ENCOUNTER — Other Ambulatory Visit: Payer: Self-pay

## 2018-03-13 ENCOUNTER — Emergency Department (HOSPITAL_COMMUNITY)
Admission: EM | Admit: 2018-03-13 | Discharge: 2018-03-13 | Disposition: A | Discharge status: Home / Self Care | Payer: Medicare Other | Attending: Emergency Medicine | Admitting: Emergency Medicine

## 2018-03-13 DIAGNOSIS — F17210 Nicotine dependence, cigarettes, uncomplicated: Secondary | ICD-10-CM | POA: Diagnosis not present

## 2018-03-13 DIAGNOSIS — Z79899 Other long term (current) drug therapy: Secondary | ICD-10-CM | POA: Diagnosis not present

## 2018-03-13 DIAGNOSIS — I129 Hypertensive chronic kidney disease with stage 1 through stage 4 chronic kidney disease, or unspecified chronic kidney disease: Secondary | ICD-10-CM | POA: Insufficient documentation

## 2018-03-13 DIAGNOSIS — Z7982 Long term (current) use of aspirin: Secondary | ICD-10-CM | POA: Diagnosis not present

## 2018-03-13 DIAGNOSIS — R079 Chest pain, unspecified: Secondary | ICD-10-CM | POA: Diagnosis present

## 2018-03-13 DIAGNOSIS — E119 Type 2 diabetes mellitus without complications: Secondary | ICD-10-CM | POA: Diagnosis not present

## 2018-03-13 DIAGNOSIS — M546 Pain in thoracic spine: Secondary | ICD-10-CM | POA: Diagnosis not present

## 2018-03-13 DIAGNOSIS — N183 Chronic kidney disease, stage 3 (moderate): Secondary | ICD-10-CM | POA: Insufficient documentation

## 2018-03-13 DIAGNOSIS — R05 Cough: Secondary | ICD-10-CM | POA: Diagnosis not present

## 2018-03-13 DIAGNOSIS — Z720 Tobacco use: Secondary | ICD-10-CM

## 2018-03-13 DIAGNOSIS — R059 Cough, unspecified: Secondary | ICD-10-CM

## 2018-03-13 LAB — CBC WITH DIFFERENTIAL/PLATELET
Abs Immature Granulocytes: 0.01 10*3/uL (ref 0.00–0.07)
Basophils Absolute: 0 10*3/uL (ref 0.0–0.1)
Basophils Relative: 1 %
EOS PCT: 2 %
Eosinophils Absolute: 0.1 10*3/uL (ref 0.0–0.5)
HCT: 30.3 % — ABNORMAL LOW (ref 36.0–46.0)
Hemoglobin: 9.4 g/dL — ABNORMAL LOW (ref 12.0–15.0)
Immature Granulocytes: 0 %
Lymphocytes Relative: 30 %
Lymphs Abs: 1.1 10*3/uL (ref 0.7–4.0)
MCH: 26.9 pg (ref 26.0–34.0)
MCHC: 31 g/dL (ref 30.0–36.0)
MCV: 86.8 fL (ref 80.0–100.0)
MONO ABS: 0.3 10*3/uL (ref 0.1–1.0)
Monocytes Relative: 8 %
Neutro Abs: 2.3 10*3/uL (ref 1.7–7.7)
Neutrophils Relative %: 59 %
Platelets: 153 10*3/uL (ref 150–400)
RBC: 3.49 MIL/uL — ABNORMAL LOW (ref 3.87–5.11)
RDW: 15.6 % — ABNORMAL HIGH (ref 11.5–15.5)
WBC: 3.8 10*3/uL — ABNORMAL LOW (ref 4.0–10.5)
nRBC: 0 % (ref 0.0–0.2)

## 2018-03-13 LAB — I-STAT TROPONIN, ED: Troponin i, poc: 0 ng/mL (ref 0.00–0.08)

## 2018-03-13 LAB — BASIC METABOLIC PANEL
Anion gap: 10 (ref 5–15)
BUN: 26 mg/dL — ABNORMAL HIGH (ref 8–23)
CO2: 18 mmol/L — ABNORMAL LOW (ref 22–32)
Calcium: 8.4 mg/dL — ABNORMAL LOW (ref 8.9–10.3)
Chloride: 106 mmol/L (ref 98–111)
Creatinine, Ser: 1.37 mg/dL — ABNORMAL HIGH (ref 0.44–1.00)
GFR calc Af Amer: 46 mL/min — ABNORMAL LOW (ref >60)
GFR calc non Af Amer: 40 mL/min — ABNORMAL LOW (ref >60)
Glucose, Bld: 356 mg/dL — ABNORMAL HIGH (ref 70–99)
Potassium: 4.4 mmol/L (ref 3.5–5.1)
Sodium: 134 mmol/L — ABNORMAL LOW (ref 135–145)

## 2018-03-13 LAB — D-DIMER, QUANTITATIVE: D-Dimer, Quant: 0.92 ug/mL-FEU — ABNORMAL HIGH (ref 0.00–0.50)

## 2018-03-13 LAB — CBG MONITORING, ED
Glucose-Capillary: 332 mg/dL — ABNORMAL HIGH (ref 70–99)
Glucose-Capillary: 185 mg/dL — ABNORMAL HIGH (ref 70–99)

## 2018-03-13 LAB — TROPONIN I: Troponin I: <0.03 ng/mL (ref <0.03)

## 2018-03-13 LAB — BRAIN NATRIURETIC PEPTIDE: B Natriuretic Peptide: 121.1 pg/mL — ABNORMAL HIGH (ref 0.0–100.0)

## 2018-03-13 MED ORDER — PREDNISONE 20 MG PO TABS: 40.0000 mg | ORAL_TABLET | Freq: Every day | ORAL | 0 refills | Status: AC

## 2018-03-13 MED ORDER — MORPHINE SULFATE (PF) 4 MG/ML IV SOLN
4.0000 mg | Freq: Once | INTRAVENOUS | Status: AC
  Administered 2018-03-13: 4 mg via INTRAVENOUS
  Filled 2018-03-13: qty 1

## 2018-03-13 MED ORDER — HYDROCODONE-ACETAMINOPHEN 5-325 MG PO TABS: 1.0000 | ORAL_TABLET | Freq: Four times a day (QID) | ORAL | 0 refills | Status: DC | PRN

## 2018-03-13 MED ORDER — PREDNISONE 20 MG PO TABS
60.0000 mg | ORAL_TABLET | Freq: Once | ORAL | Status: AC
  Administered 2018-03-13: 60 mg via ORAL
  Filled 2018-03-13: qty 3

## 2018-03-13 MED ORDER — IPRATROPIUM-ALBUTEROL 0.5-2.5 (3) MG/3ML IN SOLN
3.0000 mL | Freq: Once | RESPIRATORY_TRACT | Status: AC
  Administered 2018-03-13: 3 mL via RESPIRATORY_TRACT
  Filled 2018-03-13: qty 3

## 2018-03-13 MED ORDER — OXYCODONE-ACETAMINOPHEN 5-325 MG PO TABS
1.0000 | ORAL_TABLET | Freq: Once | ORAL | Status: AC
  Administered 2018-03-13: 1 via ORAL
  Filled 2018-03-13: qty 1

## 2018-03-13 MED ORDER — INSULIN ASPART 100 UNIT/ML ~~LOC~~ SOLN
5.0000 [IU] | Freq: Once | SUBCUTANEOUS | Status: AC
  Administered 2018-03-13: 5 [IU] via INTRAVENOUS

## 2018-03-13 MED ORDER — FUROSEMIDE 10 MG/ML IJ SOLN
40.0000 mg | Freq: Once | INTRAMUSCULAR | Status: AC
  Administered 2018-03-13: 40 mg via INTRAVENOUS
  Filled 2018-03-13: qty 4

## 2018-03-13 MED ORDER — DICLOFENAC SODIUM 1 % TD GEL: 4.0000 g | Freq: Three times a day (TID) | TRANSDERMAL | 0 refills | Status: DC | PRN

## 2018-03-13 MED ORDER — IOPAMIDOL (ISOVUE-370) INJECTION 76%
75.0000 mL | Freq: Once | INTRAVENOUS | Status: AC | PRN
  Administered 2018-03-13: 75 mL via INTRAVENOUS

## 2018-03-13 MED ORDER — SODIUM CHLORIDE 0.9 % IV BOLUS
1000.0000 mL | Freq: Once | INTRAVENOUS | Status: AC
  Administered 2018-03-13: 1000 mL via INTRAVENOUS

## 2018-03-13 NOTE — Discharge Instructions (Addendum)
Follow-up with your primary doctor for your back and side pain. Your CT scan shows degeneration and arthritis but it's important to have follow-up and further imaging for pain control and to make sure there are no masses or other issues causing your pain.

## 2018-03-13 NOTE — ED Notes (Signed)
Patient transported to CT 

## 2018-03-13 NOTE — ED Notes (Signed)
CBG 185 

## 2018-03-13 NOTE — ED Triage Notes (Signed)
Pt endorses upper back pain rated 10/10. Pt reports she was seen on the 4th for the pain, but was told it was muscular. Pt reports it now feels different/worse.  Pt reports it started to get worse when she started smoking cigarettes again.

## 2018-03-13 NOTE — ED Provider Notes (Signed)
[facility]ERGENCY DEPARTMENT Provider Note   CSN: [id number] Arrival date & time: 03/13/18  1433     History   Chief Complaint Chief Complaint  Patient presents with  . Back Pain    HPI Tia C Schmoll is a 68 y.o. female.  HPI   68 yo F with PMHx cirrhosis, DM, CHF, HTN, HLD, PAD here with chest pain. Pt states that since she began smoking again (due to stressors), she's had a constant, sharp pain in her bilateral paraspinal thoracic/posterior back. She was seen earlier this month for these sx, but they have persisted and worsened. Pain is worse w/ palpation and movement. It is sharp, also worse with deep inspiration. She has had associated increased cough. No sputum production. No fevers. No night sweats. She does not some mild increased swelling of her b/l LE as well. No abd pain. No h/o DVT, does not use estrogen.  Past Medical History:  Diagnosis Date  . ANEMIA, CHRONIC DISEASE NEC 03/15/2006  . Carotid artery occlusion   . Cirrhosis ([place])   . Colon polyps   . Constipation due to opioid therapy 06/05/2014  . DEGENERATIVE JOINT DISEASE 05/06/2007   On going for >5 yrs No imaging to confirm Has tried ultram, mobic, neurontin which did not work Ambulance person tried once worked well    . Diabetes mellitus    Type II  . DIABETIC  RETINOPATHY 03/15/2006  . 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 Diastolic dysfunction 94/70/9628   Grade 1 by Echocardiogram 12/13/14  . Elevated liver enzymes   . Heart murmur   . Hemorrhoids   . History of kidney stones   . Hyperlipidemia 04/01/2011  . Hypertension   . Mild AI (aortic insufficiency)   . Moderate aortic regurgitation 09/25/2008   12/13/14 Echocardiogram    . Myalgia   . PAD (peripheral artery disease) ([place])   . PANCREATITIS, CHRONIC 03/29/2006  . Peripheral arterial disease ([place])   . Pneumonia    age 6-20  . RENAL INSUFFICIENCY, CHRONIC 03/15/2006  . Tonsillar mass    Left    Patient Active Problem List   Diagnosis Date Noted  . Recurrent epistaxis 05/31/2017  . Throat pain in adult 05/31/2017  . Tubular adenoma of colon 06/30/2016  . Diverticulosis of colon without hemorrhage 06/30/2016  . Postmenopausal vaginal bleeding   . Long term (current) use of opiate analgesic 04/09/2016  . Bilateral carotid artery disease ([place]) 11/28/2015  . Pancreatic insufficiency 05/09/2015  . 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 Risk for coronary artery disease greater than 20% in next 10 years 03/14/2015  . Diastolic dysfunction 36/62/9476  . Vulvar cyst 01/23/2015  . Abnormal uterine bleeding 12/02/2014  . Unexplained night sweats 12/02/2014  . Constipation due to opioid therapy 06/05/2014  . Loss of weight 03/29/2014  . Peripheral arterial disease ([place]) 12/12/2013  . GERD (gastroesophageal reflux disease) 10/12/2012  . Healthcare maintenance 09/29/2012  . Back pain 03/28/2012  . Hyperlipidemia 04/01/2011  . BOILS, RECURRENT 03/13/2010  . Moderate aortic regurgitation 09/25/2008  . HEMORRHOIDS 06/08/2008  . Osteoarthritis 05/06/2007  . TOBACCO ABUSE 09/27/2006  . History of osteoporosis 06/29/2006  . Chronic pancreatitis ([place]) 03/29/2006  . Uncontrolled type 2 diabetes mellitus with chronic kidney disease, without long-term current use of insulin ([place]) 03/15/2006  . Diabetic neuropathy, painful ([place]) 03/15/2006  . Anemia of chronic disease 03/15/2006  . Essential hypertension 03/15/2006  . VENTRICULAR HYPERTROPHY, LEFT 03/15/2006  . CKD stage 3 due to  type 2 diabetes mellitus ([place]) 03/15/2006    Past Surgical History:  Procedure Laterality Date  . BREAST SURGERY    . COLONOSCOPY    . DILATION AND CURETTAGE OF UTERUS N/A 06/16/2016   Procedure: DILATATION AND CURETTAGE;  Surgeon: Mora Bellman, MD;  Location: [facility];  Service: Gynecology;  Laterality: N/A;  . HYSTEROSCOPY N/A 06/16/2016   Procedure: HYSTEROSCOPY;  Surgeon: Mora Bellman, MD;  Location: [facility];  Service: Gynecology;   Laterality: N/A;  . MOUTH SURGERY N/A   . POLYPECTOMY N/A 06/16/2016   Procedure: POLYPECTOMY;  Surgeon: Mora Bellman, MD;  Location: [facility];  Service: Gynecology;  Laterality: N/A;     OB History    Gravida  3   Para  3   Term  3   Preterm      AB      Living  3     SAB      TAB      Ectopic      Multiple      Live Births  3            Home Medications    Prior to Admission medications   Medication Sig Start Date End Date Taking? Authorizing Provider  amLODipine (NORVASC) 10 MG tablet Take 10 mg by mouth daily. 06/01/17  Yes [provider]  aspirin EC 81 MG tablet Take 1 tablet (81 mg total) by mouth daily. 04/09/16  Yes Lucious Groves, DO  cyclobenzaprine (FLEXERIL) 10 MG tablet Take 1 tablet (10 mg total) by mouth 3 (three) times daily as needed for muscle spasms. 02/27/18  Yes Virgel Manifold, MD  LANTUS SOLOSTAR 100 UNIT/ML Solostar Pen Inject 12-14 Units into the skin daily after breakfast.  05/04/17  Yes [provider]  LINZESS 145 MCG CAPS capsule Take 145 mcg by mouth daily.  03/07/17  Yes [provider]  meclizine (ANTIVERT) 25 MG tablet Take 1 tablet (25 mg total) by mouth 3 (three) times daily as needed for dizziness. 10/18/98  Yes Delora Fuel, MD  Multiple Vitamins-Minerals (ONE-A-DAY WOMENS 50+ ADVANTAGE) TABS Take 1 tablet by mouth daily with breakfast.   Yes [provider]  NONFORMULARY OR COMPOUNDED ITEM Apply 1-2 g topically 4 (four) times daily. [facility]  Peripheral Neuropathy Cream- Bupivacaine 1%, Doxepin 3%, Gabapentin 6%, Pentoxifylline 3%, Topiramate 1% Apply 1-2 grams to affected area 3-4 times daily Qty. 120 gm 3 refills 12/03/17  Yes Price, Christian Mate, DPM  Pancrelipase, Lip-Prot-Amyl, 24000-76000 units CPEP Take 4 capsules (96,000 Units total) by mouth See admin instructions. Take 4 capsules by mouth 2-3 times daily before meals Patient taking differently: Take 2-4 capsules by mouth See admin  instructions. Take 4 capsules by mouth two to three times a day with meals and 2 capsules with any snacks 03/10/16  Yes Lucious Groves, DO  PROAIR HFA 108 (90 Base) MCG/ACT inhaler Inhale 2 puffs into the lungs 4 (four) times daily as needed for wheezing or shortness of breath. 04/30/17  Yes [provider]  rosuvastatin (CRESTOR) 20 MG tablet Take 20 mg by mouth daily. 10/03/17  Yes [provider]  silver sulfADIAZINE (SILVADENE) 1 % cream Apply 1 application topically daily. Apply topically daily to foot. Patient taking differently: Apply 1 application topically See admin instructions. Apply to bottom of right foot as needed for irritation 08/19/17  Yes Price, Christian Mate, DPM  sitaGLIPtin (JANUVIA) 50 MG tablet Take 1 tablet (50 mg total) by mouth daily. 09/10/16  Yes Lucious Groves, DO  ACCU-CHEK AVIVA PLUS test strip TEST three times a day 11/25/15   Lucious Groves, DO  Blood Glucose Monitoring Suppl (ACCU-CHEK AVIVA PLUS) w/Device KIT Use to check you blood sugar 3 times a day 06/04/15   Oval Linsey, MD  diclofenac sodium (VOLTAREN) 1 % GEL Apply 4 g topically 3 (three) times daily as needed. 03/13/18   Duffy Bruce, MD  HYDROcodone-acetaminophen (NORCO/VICODIN) 5-325 MG tablet Take 1-2 tablets by mouth every 6 (six) hours as needed for moderate pain or severe pain. 03/13/18   Duffy Bruce, MD  predniSONE (DELTASONE) 20 MG tablet Take 2 tablets (40 mg total) by mouth daily for 5 days. 03/13/18 03/18/18  Duffy Bruce, MD  ranitidine (ZANTAC) 150 MG tablet Take 1 tablet (150 mg total) by mouth daily. Patient not taking: Reported on 03/13/2018 01/18/18   Irene Shipper, MD  traMADol (ULTRAM) 50 MG tablet Take 1 tablet (50 mg total) by mouth every 6 (six) hours as needed. Patient not taking: Reported on 03/13/2018 08/30/17   Daleen Bo, MD    Family History Family History  Problem Relation Age of Onset  . Dementia Mother   . Colon cancer Neg Hx     Social History Social  History   Tobacco Use  . Smoking status: Current Every Day Smoker    Packs/day: 0.30    Years: 44.00    Pack years: 13.20    Types: Cigarettes    Last attempt to quit: 01/2017    Years since quitting: 1.1  . Smokeless tobacco: Never Used  . Tobacco comment: started smoking in Nov 2019 few cigarettes a day  Substance Use Topics  . Alcohol use: Yes    Alcohol/week: 0.0 standard drinks    Comment: occ  . Drug use: No     Allergies   Atorvastatin; Gabapentin; and Nicorette [nicotine]   Review of Systems Review of Systems  Constitutional: Positive for fatigue. Negative for chills and fever.  HENT: Negative for congestion and rhinorrhea.   Eyes: Negative for visual disturbance.  Respiratory: Positive for cough, shortness of breath and wheezing.   Cardiovascular: Positive for chest pain. Negative for leg swelling.  Gastrointestinal: Negative for abdominal pain, diarrhea, nausea and vomiting.  Genitourinary: Negative for dysuria and flank pain.  Musculoskeletal: Negative for neck pain and neck stiffness.  Skin: Negative for rash and wound.  Allergic/Immunologic: Negative for immunocompromised state.  Neurological: Negative for syncope, weakness and headaches.  All other systems reviewed and are negative.    Physical Exam Updated Vital Signs BP 137/77 (BP Location: Right Arm)   Pulse 80   Temp 97.8 F (36.6 C) (Oral)   Resp 19   Ht 5' 5"  (1.651 m)   LMP 02/22/2016   SpO2 99%   BMI 22.47 kg/m   Physical Exam Vitals signs and nursing note reviewed.  Constitutional:      General: She is not in acute distress.    Appearance: She is well-developed.  HENT:     Head: Normocephalic and atraumatic.  Eyes:     Conjunctiva/sclera: Conjunctivae normal.  Neck:     Musculoskeletal: Neck supple.  Cardiovascular:     Rate and Rhythm: Normal rate and regular rhythm.     Heart sounds: Normal heart sounds. No murmur. No friction rub.  Pulmonary:     Effort: Pulmonary effort  is normal. No respiratory distress.     Breath sounds: Wheezing present. No rales.  Abdominal:  General: There is no distension.     Palpations: Abdomen is soft.     Tenderness: There is no abdominal tenderness.  Skin:    General: Skin is warm.     Capillary Refill: Capillary refill takes less than 2 seconds.  Neurological:     Mental Status: She is alert and oriented to person, place, and time.     Motor: No abnormal muscle tone.      ED Treatments / Results  Labs (all labs ordered are listed, but only abnormal results are displayed) Labs Reviewed  CBC WITH DIFFERENTIAL/PLATELET - Abnormal; Notable for the following components:      Result Value   WBC 3.8 (*)    RBC 3.49 (*)    Hemoglobin 9.4 (*)    HCT 30.3 (*)    RDW 15.6 (*)    All other components within normal limits  BASIC METABOLIC PANEL - Abnormal; Notable for the following components:   Sodium 134 (*)    CO2 18 (*)    Glucose, Bld 356 (*)    BUN 26 (*)    Creatinine, Ser 1.37 (*)    Calcium 8.4 (*)    GFR calc non Af Amer 40 (*)    GFR calc Af Amer 46 (*)    All other components within normal limits  D-DIMER, QUANTITATIVE (NOT AT [facility]) - Abnormal; Notable for the following components:   D-Dimer, Quant 0.92 (*)    All other components within normal limits  BRAIN NATRIURETIC PEPTIDE - Abnormal; Notable for the following components:   B Natriuretic Peptide 121.1 (*)    All other components within normal limits  CBG MONITORING, ED - Abnormal; Notable for the following components:   Glucose-Capillary 332 (*)    All other components within normal limits  CBG MONITORING, ED - Abnormal; Notable for the following components:   Glucose-Capillary 185 (*)    All other components within normal limits  TROPONIN I  I-STAT TROPONIN, ED    EKG EKG Interpretation  Date/Time:  Sunday March 13 2018 14:43:03 EST Ventricular Rate:  78 PR Interval:    QRS Duration: 109 QT Interval:  416 QTC Calculation: 474 R  Axis:   -32 Text Interpretation:  Sinus rhythm Left ventricular hypertrophy Anterior Q waves, possibly due to LVH Baseline wander in lead(s) V5 V6 Confirmed by Quintella Reichert [phone]) on 03/13/2018 2:45:31 PM   Radiology Dg Chest 2 View  Result Date: 03/13/2018 CLINICAL DATA:  68 year old female with chest pain, back pain, shortness of breath. EXAM: CHEST - 2 VIEW COMPARISON:  02/26/2018 and earlier. FINDINGS: Lung volumes and mediastinal contours are stable. Chronic linear opacity at the left lung base is stable and most likely scarring. Lesser linear changes at the right lung base which were more apparent by CT in 2019. No pneumothorax, pulmonary edema, pleural effusion or acute pulmonary opacity. Visualized tracheal air column is within normal limits. No acute osseous abnormality identified. Negative visible bowel gas pattern. Stable cholecystectomy clips. Abdominal Calcified aortic atherosclerosis. IMPRESSION: No acute cardiopulmonary abnormality.  Chronic lung base scarring. Electronically Signed   By: Genevie Ann M.D.   On: 03/13/2018 17:02   Ct Angio Chest Pe W Or Wo Contrast  Result Date: 03/13/2018 CLINICAL DATA:  Pt c/o worsening back pain with difficulty breathing x weeks. 52 ml isovue 370 iv^55m ISOVUE-370 IOPAMIDOL (ISOVUE-370) INJECTION 76%PE suspected, high pretest prob EXAM: CT ANGIOGRAPHY CHEST WITH CONTRAST TECHNIQUE: Multidetector CT imaging of the chest was performed using the standard  protocol during bolus administration of intravenous contrast. Multiplanar CT image reconstructions and MIPs were obtained to evaluate the vascular anatomy. CONTRAST:  53m ISOVUE-370 IOPAMIDOL (ISOVUE-370) INJECTION 76% COMPARISON:  CT 10/05/2017 FINDINGS: Cardiovascular: No filling defects within the pulmonary arteries to suggest acute pulmonary embolism. No acute findings of the aorta or great vessels. No pericardial fluid. Pulmonary artery is enlarged at 35 mm. Mediastinum/Nodes: No axillary or  supraclavicular adenopathy. No mediastinal hilar adenopathy. No pericardial effusion. Esophagus normal. Lungs/Pleura: No pulmonary infarction. No consolidation. Mild interstitial edema in the lower lobes. Small subpleural nodule in the RIGHT upper lobe measuring 4 mm (image 45/7). No change. Additional nodules along the RIGHT horizontal fissure less than 5 mm. Upper Abdomen: Limited view of the liver, kidneys, pancreas are unremarkable. Normal adrenal glands. Musculoskeletal: Multiple levels of vertebral body sclerosis involving T2 through T6 are progressed from 03/24/2017 Review of the MIP images confirms the above findings. IMPRESSION: 1. No evidence acute pulmonary embolism. 2. Mild interstitial edema pattern. 3. Large pulmonary artery suggest pulmonary hypertension. 4. RIGHT upper lobe pulmonary nodule.  Unchanged. 5. Sclerosis of the upper thoracic spine vertebral bodies progressive from comparison exam 1 year prior. Favor degenerative change. Cannot exclude sclerotic skeletal metastasis. Electronically Signed   By: SSuzy BouchardM.D.   On: 03/13/2018 20:21    Procedures Procedures (including critical care time)  Medications Ordered in ED Medications  morphine 4 MG/ML injection 4 mg (4 mg Intravenous Given 03/13/18 1614)  predniSONE (DELTASONE) tablet 60 mg (60 mg Oral Given 03/13/18 1613)  ipratropium-albuterol (DUONEB) 0.5-2.5 (3) MG/3ML nebulizer solution 3 mL (3 mLs Nebulization Given 03/13/18 1614)  sodium chloride 0.9 % bolus 1,000 mL (0 mLs Intravenous Stopped 03/13/18 2055)  insulin aspart (novoLOG) injection 5 Units (5 Units Intravenous Given 03/13/18 2009)  iopamidol (ISOVUE-370) 76 % injection 75 mL (75 mLs Intravenous Contrast Given 03/13/18 1919)  oxyCODONE-acetaminophen (PERCOCET/ROXICET) 5-325 MG per tablet 1 tablet (1 tablet Oral Given 03/13/18 2114)  furosemide (LASIX) injection 40 mg (40 mg Intravenous Given 03/13/18 2114)     Initial Impression / Assessment and Plan / ED Course    I have reviewed the triage vital signs and the nursing notes.  Pertinent labs & imaging results that were available during my care of the patient were reviewed by me and considered in my medical decision making (see chart for details).     68yo F here with persistent upper back pain, worse w/ movement and inspiration. Seen several weeks ago and diagnosed with MSk pain. Suspect this is ongoing intercostal/MSK pain 2/2 coughing from her underlying COPD. However, D-dimer positive here so CT Angio obtained. Fortunately, no evidence of PE. She does have likely plm HTN and I suspect this is 2/2 her underlying COPD. Dose of lasix given here. She is otherwise HDS and well appearing. Trop neg x 2, EKG non-ischemic, pain is reproducible and positional and I do not suspect this is ischemia/ACS. Of note, pt has sclerosis of upper T-spine vertebral bodies. I suspect this is contributing to her MSK pain, and correlates with her exam. No signs of cord compression. While CT favors degen changes, cannot r/o sclerotic mets - no signs of other cancer, but given her smoking history this was d/w patient and I have referred her to both a PCP and Spine specialist, as well as reiterated the importance of following up.  Final Clinical Impressions(s) / ED Diagnoses   Final diagnoses:  Acute bilateral thoracic back pain  Cough  Tobacco abuse  ED Discharge Orders         Ordered    HYDROcodone-acetaminophen (NORCO/VICODIN) 5-325 MG tablet  Every 6 hours PRN     03/13/18 2221    predniSONE (DELTASONE) 20 MG tablet  Daily     03/13/18 2221    diclofenac sodium (VOLTAREN) 1 % GEL  3 times daily PRN     03/13/18 2221           Duffy Bruce, MD 03/14/18 1100

## 2018-03-13 NOTE — ED Provider Notes (Signed)
[facility] EMERGENCY DEPARTMENT Provider Note   CSN: [id number] Arrival date & time: 02/26/18  1921     History   Chief Complaint Chief Complaint  Patient presents with  . Chest Pain    HPI [name] is a 68 y.o. female.  HPI   67yF with pain in R upper back. Gradual onset about a week ago. No trauma or strain. Pain is constant. Worse with certain movement. Hurts primarily in R scapular region with some radiation towards axilla. No respiratory complaints. Denies change is exertion. No rash. No radicular symptoms into arm.   Past Medical History:  Diagnosis Date  . ANEMIA, CHRONIC DISEASE NEC 03/15/2006  . Carotid artery occlusion   . Cirrhosis ([place])   . Colon polyps   . Constipation due to opioid therapy 06/05/2014  . DEGENERATIVE JOINT DISEASE 05/06/2007   On going for >5 yrs No imaging to confirm Has tried ultram, mobic, neurontin which did not work Ambulance person tried once worked well    . Diabetes mellitus    Type II  . DIABETIC  RETINOPATHY 03/15/2006  . 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 Diastolic dysfunction 59/16/3846   Grade 1 by Echocardiogram 12/13/14  . Elevated liver enzymes   . Heart murmur   . Hemorrhoids   . History of kidney stones   . Hyperlipidemia 04/01/2011  . Hypertension   . Mild AI (aortic insufficiency)   . Moderate aortic regurgitation 09/25/2008   12/13/14 Echocardiogram    . Myalgia   . PAD (peripheral artery disease) ([place])   . PANCREATITIS, CHRONIC 03/29/2006  . Peripheral arterial disease ([place])   . Pneumonia    age 63-20  . RENAL INSUFFICIENCY, CHRONIC 03/15/2006  . Tonsillar mass    Left    Patient Active Problem List   Diagnosis Date Noted  . Recurrent epistaxis 05/31/2017  . Throat pain in adult 05/31/2017  . Tubular adenoma of colon 06/30/2016  . Diverticulosis of colon without hemorrhage 06/30/2016  . Postmenopausal vaginal bleeding   . Long term (current) use of opiate analgesic 04/09/2016  .  Bilateral carotid artery disease ([place]) 11/28/2015  . Pancreatic insufficiency 05/09/2015  . 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 Risk for coronary artery disease greater than 20% in next 10 years 03/14/2015  . Diastolic dysfunction 65/99/3570  . Vulvar cyst 01/23/2015  . Abnormal uterine bleeding 12/02/2014  . Unexplained night sweats 12/02/2014  . Constipation due to opioid therapy 06/05/2014  . Loss of weight 03/29/2014  . Peripheral arterial disease ([place]) 12/12/2013  . GERD (gastroesophageal reflux disease) 10/12/2012  . Healthcare maintenance 09/29/2012  . Back pain 03/28/2012  . Hyperlipidemia 04/01/2011  . BOILS, RECURRENT 03/13/2010  . Moderate aortic regurgitation 09/25/2008  . HEMORRHOIDS 06/08/2008  . Osteoarthritis 05/06/2007  . TOBACCO ABUSE 09/27/2006  . History of osteoporosis 06/29/2006  . Chronic pancreatitis ([place]) 03/29/2006  . Uncontrolled type 2 diabetes mellitus with chronic kidney disease, without long-term current use of insulin ([place]) 03/15/2006  . Diabetic neuropathy, painful ([place]) 03/15/2006  . Anemia of chronic disease 03/15/2006  . Essential hypertension 03/15/2006  . VENTRICULAR HYPERTROPHY, LEFT 03/15/2006  . CKD stage 3 due to type 2 diabetes mellitus ([place]) 03/15/2006    Past Surgical History:  Procedure Laterality Date  . BREAST SURGERY    . COLONOSCOPY    . DILATION AND CURETTAGE OF UTERUS N/A 06/16/2016   Procedure: DILATATION AND CURETTAGE;  Surgeon: Mora Bellman, MD;  Location: [facility];  Service: Gynecology;  Laterality: N/A;  . HYSTEROSCOPY N/A 06/16/2016   Procedure: HYSTEROSCOPY;  Surgeon: Mora Bellman, MD;  Location: [facility];  Service: Gynecology;  Laterality: N/A;  . MOUTH SURGERY N/A   . POLYPECTOMY N/A 06/16/2016   Procedure: POLYPECTOMY;  Surgeon: Mora Bellman, MD;  Location: [facility];  Service: Gynecology;  Laterality: N/A;     OB History    Gravida  3   Para  3   Term  3   Preterm      AB      Living  3      SAB      TAB      Ectopic      Multiple      Live Births  3            Home Medications    Prior to Admission medications   Medication Sig Start Date End Date Taking? Authorizing Provider  ACCU-CHEK AVIVA PLUS test strip TEST three times a day 11/25/15   Lucious Groves, DO  amLODipine (NORVASC) 10 MG tablet Take 10 mg by mouth daily. 06/01/17   [provider]  aspirin EC 81 MG tablet Take 1 tablet (81 mg total) by mouth daily. 04/09/16   Lucious Groves, DO  atorvastatin (LIPITOR) 40 MG tablet Take 1 tablet (40 mg total) by mouth daily. 03/25/16 06/08/18  Lucious Groves, DO  Blood Glucose Monitoring Suppl (ACCU-CHEK AVIVA PLUS) w/Device KIT Use to check you blood sugar 3 times a day 06/04/15   Oval Linsey, MD  cyclobenzaprine (FLEXERIL) 10 MG tablet Take 1 tablet (10 mg total) by mouth 3 (three) times daily as needed for muscle spasms. 02/27/18   Virgel Manifold, MD  diclofenac sodium (VOLTAREN) 1 % GEL Apply 4 g topically 4 (four) times daily as needed for pain. 06/01/17   [provider]  docusate sodium (COLACE) 100 MG capsule Take 100 mg by mouth daily as needed (for constipation).     [provider]  HYDROcodone-acetaminophen (NORCO/VICODIN) 5-325 MG tablet Take 1 tablet by mouth every 6 (six) hours as needed. 02/27/18   Virgel Manifold, MD  LANTUS SOLOSTAR 100 UNIT/ML Solostar Pen Inject 18 Units into the skin daily after breakfast.  05/04/17   [provider]  LINZESS 145 MCG CAPS capsule Take 145 mcg by mouth daily.  03/07/17   [provider]  loperamide (IMODIUM) 2 MG capsule Take 1 capsule (2 mg total) 4 (four) times daily as needed by mouth for diarrhea or loose stools. 88/1/10   Delora Fuel, MD  meclizine (ANTIVERT) 25 MG tablet Take 1 tablet (25 mg total) by mouth 3 (three) times daily as needed for dizziness. 05/07/92   Delora Fuel, MD  Multiple Vitamins-Minerals (ONE-A-DAY WOMENS 50+ ADVANTAGE) TABS Take 1 tablet by mouth daily  with breakfast.    [provider]  NONFORMULARY OR COMPOUNDED ITEM Apply 1-2 g topically 4 (four) times daily. [facility]  Peripheral Neuropathy Cream- Bupivacaine 1%, Doxepin 3%, Gabapentin 6%, Pentoxifylline 3%, Topiramate 1% Apply 1-2 grams to affected area 3-4 times daily Qty. 120 gm 3 refills 12/03/17   Evelina Bucy, DPM  omeprazole (PRILOSEC) 40 MG capsule TK ONE C PO QD TAKE ON EMPTY STOMACH 30 TO 45 MINUTES BEFORE YOUR FIRST MEAL 01/18/18   Irene Shipper, MD  Pancrelipase, Lip-Prot-Amyl, 24000-76000 units CPEP Take 4 capsules (96,000 Units total) by mouth See admin instructions. Take 4 capsules by mouth 2-3 times daily before meals Patient taking differently:  Take 2-4 capsules by mouth See admin instructions. Take 4 capsules by mouth two to three times a day with meals and 2 capsules with any snacks 03/10/16   Lucious Groves, DO  PROAIR HFA 108 [phone] Base) MCG/ACT inhaler Inhale 2 puffs into the lungs 4 (four) times daily as needed for wheezing or shortness of breath. 04/30/17   [provider]  ranitidine (ZANTAC) 150 MG tablet Take 1 tablet (150 mg total) by mouth daily. 01/18/18   Irene Shipper, MD  rosuvastatin (CRESTOR) 20 MG tablet Take 20 mg by mouth daily. 10/03/17   [provider]  silver sulfADIAZINE (SILVADENE) 1 % cream Apply 1 application topically daily. Apply topically daily to foot. Patient taking differently: Apply 1 application topically See admin instructions. Apply to bottom of right foot as needed for irritation 08/19/17   Price, Christian Mate, DPM  sitaGLIPtin (JANUVIA) 50 MG tablet Take 1 tablet (50 mg total) by mouth daily. 09/10/16   Lucious Groves, DO  traMADol (ULTRAM) 50 MG tablet Take 1 tablet (50 mg total) by mouth every 6 (six) hours as needed. Patient taking differently: Take 50 mg by mouth every 6 (six) hours as needed for moderate pain.  08/30/17   Daleen Bo, MD    Family History Family History  Problem Relation Age of  Onset  . Dementia Mother   . Colon cancer Neg Hx     Social History Social History   Tobacco Use  . Smoking status: Current Every Day Smoker    Packs/day: 0.30    Years: 44.00    Pack years: 13.20    Types: Cigarettes    Last attempt to quit: 01/2017    Years since quitting: 1.1  . Smokeless tobacco: Never Used  . Tobacco comment: started smoking in Nov 2019 few cigarettes a day  Substance Use Topics  . Alcohol use: Yes    Alcohol/week: 0.0 standard drinks    Comment: occ  . Drug use: No     Allergies   Atorvastatin; Gabapentin; and Nicorette [nicotine]   Review of Systems Review of Systems  All systems reviewed and negative, other than as noted in HPI.  Physical Exam Updated Vital Signs BP (!) 147/61   Pulse 72   Temp 98.1 F (36.7 C) (Oral)   Resp 15   LMP 02/22/2016   SpO2 99%   Physical Exam Vitals signs and nursing note reviewed.  Constitutional:      General: She is not in acute distress.    Appearance: She is well-developed.  HENT:     Head: Normocephalic and atraumatic.  Eyes:     General:        Right eye: No discharge.        Left eye: No discharge.     Conjunctiva/sclera: Conjunctivae normal.  Neck:     Musculoskeletal: Neck supple.  Cardiovascular:     Rate and Rhythm: Normal rate and regular rhythm.     Heart sounds: Normal heart sounds. No murmur. No friction rub. No gallop.   Pulmonary:     Effort: Pulmonary effort is normal. No respiratory distress.     Breath sounds: Normal breath sounds.  Abdominal:     General: There is no distension.     Palpations: Abdomen is soft.     Tenderness: There is no abdominal tenderness.  Musculoskeletal:        General: Tenderness present.       Arms:     Comments:  TTP in pictured area. No overlying skin changes. No midline tenderness.   Lower extremities symmetric as compared to each other. No calf tenderness. Negative Homan's. No palpable cords.   Skin:    General: Skin is warm and dry.    Neurological:     Mental Status: She is alert.  Psychiatric:        Behavior: Behavior normal.        Thought Content: Thought content normal.      ED Treatments / Results  Labs (all labs ordered are listed, but only abnormal results are displayed) Labs Reviewed  BASIC METABOLIC PANEL - Abnormal; Notable for the following components:      Result Value   Sodium 131 (*)    CO2 19 (*)    Glucose, Bld 158 (*)    Creatinine, Ser 1.62 (*)    Calcium 8.2 (*)    GFR calc non Af Amer 33 (*)    GFR calc Af Amer 38 (*)    All other components within normal limits  CBC - Abnormal; Notable for the following components:   WBC 3.2 (*)    RBC 3.52 (*)    Hemoglobin 9.6 (*)    HCT 30.7 (*)    RDW 16.6 (*)    Platelets 143 (*)    All other components within normal limits  CBG MONITORING, ED - Abnormal; Notable for the following components:   Glucose-Capillary 48 (*)    All other components within normal limits  CBG MONITORING, ED - Abnormal; Notable for the following components:   Glucose-Capillary 129 (*)    All other components within normal limits  I-STAT TROPONIN, ED    EKG EKG Interpretation  Date/Time:  Saturday February 26 2018 20:52:25 EST Ventricular Rate:  64 PR Interval:  148 QRS Duration: 128 QT Interval:  436 QTC Calculation: 449 R Axis:   -23 Text Interpretation:  Normal sinus rhythm Non-specific intra-ventricular conduction block Cannot rule out Septal infarct , age undetermined Abnormal ECG No significant change since last tracing Confirmed by Virgel Manifold [id number]) on 02/27/2018 2:07:50 AM   Radiology No results found.   Dg Chest 2 View  Result Date: 02/26/2018 CLINICAL DATA:  Posterior mid chest pain radiating anteriorly since yesterday, history COPD, diabetes mellitus, cirrhosis, hypertension, smoker EXAM: CHEST - 2 VIEW COMPARISON:  11/22/2017 FINDINGS: Upper normal heart size. Mediastinal contours and pulmonary vascularity normal. Chronic subsegmental  atelectasis versus scarring LEFT base. Minimal chronic central peribronchial thickening. No pulmonary infiltrate, pleural effusion, or pneumothorax. Bones unremarkable. IMPRESSION: Chronic bronchitic changes with chronic subsegmental atelectasis versus scarring at LEFT base. No acute abnormalities. Electronically Signed   By: Lavonia Dana M.D.   On: 02/26/2018 21:30    Procedures Procedures (including critical care time)  Medications Ordered in ED Medications  oxyCODONE-acetaminophen (PERCOCET/ROXICET) 5-325 MG per tablet 1 tablet (1 tablet Oral Given 02/27/18 0305)     Initial Impression / Assessment and Plan / ED Course  I have reviewed the triage vital signs and the nursing notes.  Pertinent labs & imaging results that were available during my care of the patient were reviewed by me and considered in my medical decision making (see chart for details).     67yF with R upper back pain. reproducible with palpation. No acute respiratory complaints. HD stable. CXR w/o acute findings. I doubt PE, dissection or ACS. Plan symptomatic tx.   It has been determined that no acute conditions requiring further emergency intervention are present at this time.  The patient has been advised of the diagnosis and plan. I reviewed any labs and imaging including any potential incidental findings. I have reviewed nursing notes and appropriate previous records. We have discussed signs and symptoms that warrant return to the ED and they are listed in the discharge instructions.      Final Clinical Impressions(s) / ED Diagnoses   Final diagnoses:  Upper back pain    ED Discharge Orders         Ordered    HYDROcodone-acetaminophen (NORCO/VICODIN) 5-325 MG tablet  Every 6 hours PRN     02/27/18 0338    cyclobenzaprine (FLEXERIL) 10 MG tablet  3 times daily PRN     02/27/18 7044           Virgel Manifold, MD 03/13/18 1523

## 2018-03-15 ENCOUNTER — Encounter (HOSPITAL_COMMUNITY): Payer: Self-pay | Admitting: Emergency Medicine

## 2018-03-15 ENCOUNTER — Emergency Department (HOSPITAL_COMMUNITY)
Admission: EM | Admit: 2018-03-15 | Discharge: 2018-03-15 | Disposition: A | Discharge status: Home / Self Care | Payer: Medicare Other | Attending: Emergency Medicine | Admitting: Emergency Medicine

## 2018-03-15 ENCOUNTER — Other Ambulatory Visit: Payer: Self-pay

## 2018-03-15 ENCOUNTER — Telehealth: Payer: Self-pay | Admitting: *Deleted

## 2018-03-15 DIAGNOSIS — I129 Hypertensive chronic kidney disease with stage 1 through stage 4 chronic kidney disease, or unspecified chronic kidney disease: Secondary | ICD-10-CM | POA: Diagnosis not present

## 2018-03-15 DIAGNOSIS — Z7982 Long term (current) use of aspirin: Secondary | ICD-10-CM | POA: Insufficient documentation

## 2018-03-15 DIAGNOSIS — Z794 Long term (current) use of insulin: Secondary | ICD-10-CM | POA: Insufficient documentation

## 2018-03-15 DIAGNOSIS — N183 Chronic kidney disease, stage 3 (moderate): Secondary | ICD-10-CM | POA: Diagnosis not present

## 2018-03-15 DIAGNOSIS — R739 Hyperglycemia, unspecified: Secondary | ICD-10-CM

## 2018-03-15 DIAGNOSIS — E1165 Type 2 diabetes mellitus with hyperglycemia: Secondary | ICD-10-CM | POA: Diagnosis not present

## 2018-03-15 LAB — BASIC METABOLIC PANEL
Anion gap: 7 (ref 5–15)
BUN: 37 mg/dL — ABNORMAL HIGH (ref 8–23)
CO2: 21 mmol/L — ABNORMAL LOW (ref 22–32)
Calcium: 8.9 mg/dL (ref 8.9–10.3)
Chloride: 102 mmol/L (ref 98–111)
Creatinine, Ser: 1.31 mg/dL — ABNORMAL HIGH (ref 0.44–1.00)
GFR calc Af Amer: 49 mL/min — ABNORMAL LOW (ref >60)
GFR, EST NON AFRICAN AMERICAN: 42 mL/min — AB (ref >60)
Glucose, Bld: 292 mg/dL — ABNORMAL HIGH (ref 70–99)
Potassium: 4.2 mmol/L (ref 3.5–5.1)
Sodium: 130 mmol/L — ABNORMAL LOW (ref 135–145)

## 2018-03-15 LAB — URINALYSIS, ROUTINE W REFLEX MICROSCOPIC
Bacteria, UA: NONE SEEN
Bilirubin Urine: NEGATIVE
Glucose, UA: 50 mg/dL — AB
Hgb urine dipstick: NEGATIVE
Ketones, ur: NEGATIVE mg/dL
Leukocytes, UA: NEGATIVE
Nitrite: NEGATIVE
PROTEIN: 30 mg/dL — AB
SPECIFIC GRAVITY, URINE: 1.011 (ref 1.005–1.030)
pH: 5 (ref 5.0–8.0)

## 2018-03-15 LAB — CBC
HCT: 32.1 % — ABNORMAL LOW (ref 36.0–46.0)
Hemoglobin: 10.2 g/dL — ABNORMAL LOW (ref 12.0–15.0)
MCH: 26.9 pg (ref 26.0–34.0)
MCHC: 31.8 g/dL (ref 30.0–36.0)
MCV: 84.7 fL (ref 80.0–100.0)
NRBC: 0 % (ref 0.0–0.2)
Platelets: 182 10*3/uL (ref 150–400)
RBC: 3.79 MIL/uL — AB (ref 3.87–5.11)
RDW: 15.4 % (ref 11.5–15.5)
WBC: 5.6 10*3/uL (ref 4.0–10.5)

## 2018-03-15 LAB — CBG MONITORING, ED
GLUCOSE-CAPILLARY: 270 mg/dL — AB (ref 70–99)
Glucose-Capillary: 358 mg/dL — ABNORMAL HIGH (ref 70–99)

## 2018-03-15 MED ORDER — INSULIN ASPART 100 UNIT/ML ~~LOC~~ SOLN
5.0000 [IU] | Freq: Once | SUBCUTANEOUS | Status: AC
  Administered 2018-03-15: 5 [IU] via SUBCUTANEOUS

## 2018-03-15 NOTE — ED Notes (Signed)
CBG at 20:38 is 270mg /dL

## 2018-03-15 NOTE — Telephone Encounter (Signed)
"  I tried to call [name].  We have not received her Cardiac clearance nor her history and physical form.  She's scheduled for surgery on January 29.  It appears that she has has several visits to the hospital for her heart but they haven't found anything conclusive about what's going on.  The last time you all scheduled her, we ran into the same problem."  I will give her a call and see what's going on.  I will let Dr. March Rummage know what's going on as well.  I attempted to call the patient.  She did not answer.  From looking in Epic, it appears that she is in the emergency department at this time.

## 2018-03-15 NOTE — ED Notes (Signed)
Pt to room, Pt stated her blood sugar read "HI" on her home meter.  Pt vitals assessed, IV initiated and blood drawn.  Pt advised need for UA and to let us know when she needs to go to restroom.

## 2018-03-15 NOTE — Discharge Instructions (Signed)
Your elevated blood sugar is likely due to being on prednisone, a steroid. Monitor your blood sugar carefully, adjust your insulin as necessary and monitor your food intake.  Follow up with your doctor for further care.

## 2018-03-15 NOTE — Progress Notes (Signed)
Patient had been to ED multiple time over past 44mos with chest pain. She was supposed to have followed up with a cardiologist and has not. Delydia at Dr Price's office notified of need for H&P and cardiac clearance.

## 2018-03-15 NOTE — Telephone Encounter (Signed)
We will probably have to hold off but let's not cancel anything until we know what's going on and get a hold of her

## 2018-03-15 NOTE — ED Provider Notes (Signed)
[facility] EMERGENCY DEPARTMENT Provider Note   CSN: [id number] Arrival date & time: 03/15/18  1521     History   Chief Complaint Chief Complaint  Patient presents with  . Hyperglycemia    HPI [name] is a 68 y.o. female.  The history is provided by the patient and medical records. No language interpreter was used.     68 year old female with history of type 2 insulin-dependent diabetes, CAD, cirrhosis, peripheral artery disease presenting with concern of hyperglycemia.  Patient was seen in the ED 2 days ago for back pain.  She was discharged home with pain medication and steroid for back pain.  She does check her blood sugar on a regular basis and today before breakfast she noticed that her glucometer read "high".  Patient was very distraught over this and states that her blood sugars usually pretty well controlled.  She does not have any new complaints, states medication for back has helped.  She denies any confusion, lightheadedness, dizziness, generalized weakness.  Denies any infectious symptoms.  Past Medical History:  Diagnosis Date  . ANEMIA, CHRONIC DISEASE NEC 03/15/2006  . Carotid artery occlusion   . Cirrhosis ([place])   . Colon polyps   . Constipation due to opioid therapy 06/05/2014  . DEGENERATIVE JOINT DISEASE 05/06/2007   On going for >5 yrs No imaging to confirm Has tried ultram, mobic, neurontin which did not work Ambulance person tried once worked well    . Diabetes mellitus    Type II  . DIABETIC  RETINOPATHY 03/15/2006  . 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 Diastolic dysfunction 29/47/6546   Grade 1 by Echocardiogram 12/13/14  . Elevated liver enzymes   . Heart murmur   . Hemorrhoids   . History of kidney stones   . Hyperlipidemia 04/01/2011  . Hypertension   . Mild AI (aortic insufficiency)   . Moderate aortic regurgitation 09/25/2008   12/13/14 Echocardiogram    . Myalgia   . PAD (peripheral artery disease) ([place])   .  PANCREATITIS, CHRONIC 03/29/2006  . Peripheral arterial disease ([place])   . Pneumonia    age [age]  . RENAL INSUFFICIENCY, CHRONIC 03/15/2006  . Tonsillar mass    Left    Patient Active Problem List   Diagnosis Date Noted  . Recurrent epistaxis 05/31/2017  . Throat pain in adult 05/31/2017  . Tubular adenoma of colon 06/30/2016  . Diverticulosis of colon without hemorrhage 06/30/2016  . Postmenopausal vaginal bleeding   . Long term (current) use of opiate analgesic 04/09/2016  . Bilateral carotid artery disease ([place]) 11/28/2015  . Pancreatic insufficiency 05/09/2015  . 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 Risk for coronary artery disease greater than 20% in next 10 years 03/14/2015  . Diastolic dysfunction 50/35/4656  . Vulvar cyst 01/23/2015  . Abnormal uterine bleeding 12/02/2014  . Unexplained night sweats 12/02/2014  . Constipation due to opioid therapy 06/05/2014  . Loss of weight 03/29/2014  . Peripheral arterial disease ([place]) 12/12/2013  . GERD (gastroesophageal reflux disease) 10/12/2012  . Healthcare maintenance 09/29/2012  . Back pain 03/28/2012  . Hyperlipidemia 04/01/2011  . BOILS, RECURRENT 03/13/2010  . Moderate aortic regurgitation 09/25/2008  . HEMORRHOIDS 06/08/2008  . Osteoarthritis 05/06/2007  . TOBACCO ABUSE 09/27/2006  . History of osteoporosis 06/29/2006  . Chronic pancreatitis ([place]) 03/29/2006  . Uncontrolled type 2 diabetes mellitus with chronic kidney disease, without long-term current use of insulin ([place]) 03/15/2006  . Diabetic neuropathy, painful ([place]) 03/15/2006  .  Anemia of chronic disease 03/15/2006  . Essential hypertension 03/15/2006  . VENTRICULAR HYPERTROPHY, LEFT 03/15/2006  . CKD stage 3 due to type 2 diabetes mellitus ([place]) 03/15/2006    Past Surgical History:  Procedure Laterality Date  . BREAST SURGERY    . COLONOSCOPY    . DILATION AND CURETTAGE OF UTERUS N/A 06/16/2016   Procedure: DILATATION AND CURETTAGE;  Surgeon:  Mora Bellman, MD;  Location: [facility];  Service: Gynecology;  Laterality: N/A;  . HYSTEROSCOPY N/A 06/16/2016   Procedure: HYSTEROSCOPY;  Surgeon: Mora Bellman, MD;  Location: [facility];  Service: Gynecology;  Laterality: N/A;  . MOUTH SURGERY N/A   . POLYPECTOMY N/A 06/16/2016   Procedure: POLYPECTOMY;  Surgeon: Mora Bellman, MD;  Location: [facility];  Service: Gynecology;  Laterality: N/A;     OB History    Gravida  3   Para  3   Term  3   Preterm      AB      Living  3     SAB      TAB      Ectopic      Multiple      Live Births  3            Home Medications    Prior to Admission medications   Medication Sig Start Date End Date Taking? Authorizing Provider  ACCU-CHEK AVIVA PLUS test strip TEST three times a day 11/25/15   Lucious Groves, DO  amLODipine (NORVASC) 10 MG tablet Take 10 mg by mouth daily. 06/01/17   [provider]  aspirin EC 81 MG tablet Take 1 tablet (81 mg total) by mouth daily. 04/09/16   Lucious Groves, DO  Blood Glucose Monitoring Suppl (ACCU-CHEK AVIVA PLUS) w/Device KIT Use to check you blood sugar 3 times a day 06/04/15   Oval Linsey, MD  cyclobenzaprine (FLEXERIL) 10 MG tablet Take 1 tablet (10 mg total) by mouth 3 (three) times daily as needed for muscle spasms. 02/27/18   Virgel Manifold, MD  diclofenac sodium (VOLTAREN) 1 % GEL Apply 4 g topically 3 (three) times daily as needed. 03/13/18   Duffy Bruce, MD  HYDROcodone-acetaminophen (NORCO/VICODIN) 5-325 MG tablet Take 1-2 tablets by mouth every 6 (six) hours as needed for moderate pain or severe pain. 03/13/18   Duffy Bruce, MD  LANTUS SOLOSTAR 100 UNIT/ML Solostar Pen Inject 12-14 Units into the skin daily after breakfast.  05/04/17   [provider]  LINZESS 145 MCG CAPS capsule Take 145 mcg by mouth daily.  03/07/17   [provider]  meclizine (ANTIVERT) 25 MG tablet Take 1 tablet (25 mg total) by mouth 3 (three) times daily as needed for dizziness.  4/74/25   Delora Fuel, MD  Multiple Vitamins-Minerals (ONE-A-DAY WOMENS 50+ ADVANTAGE) TABS Take 1 tablet by mouth daily with breakfast.    [provider]  NONFORMULARY OR COMPOUNDED ITEM Apply 1-2 g topically 4 (four) times daily. [facility]  Peripheral Neuropathy Cream- Bupivacaine 1%, Doxepin 3%, Gabapentin 6%, Pentoxifylline 3%, Topiramate 1% Apply 1-2 grams to affected area 3-4 times daily Qty. 120 gm 3 refills 12/03/17   Evelina Bucy, DPM  Pancrelipase, Lip-Prot-Amyl, 24000-76000 units CPEP Take 4 capsules (96,000 Units total) by mouth See admin instructions. Take 4 capsules by mouth 2-3 times daily before meals Patient taking differently: Take 2-4 capsules by mouth See admin instructions. Take 4 capsules by mouth two to three times a day with meals and 2 capsules with  any snacks 03/10/16   Lucious Groves, DO  predniSONE (DELTASONE) 20 MG tablet Take 2 tablets (40 mg total) by mouth daily for 5 days. 03/13/18 03/18/18  Duffy Bruce, MD  PROAIR HFA 108 [phone] Base) MCG/ACT inhaler Inhale 2 puffs into the lungs 4 (four) times daily as needed for wheezing or shortness of breath. 04/30/17   [provider]  ranitidine (ZANTAC) 150 MG tablet Take 1 tablet (150 mg total) by mouth daily. Patient not taking: Reported on 03/13/2018 01/18/18   Irene Shipper, MD  rosuvastatin (CRESTOR) 20 MG tablet Take 20 mg by mouth daily. 10/03/17   [provider]  silver sulfADIAZINE (SILVADENE) 1 % cream Apply 1 application topically daily. Apply topically daily to foot. Patient taking differently: Apply 1 application topically See admin instructions. Apply to bottom of right foot as needed for irritation 08/19/17   Price, Christian Mate, DPM  sitaGLIPtin (JANUVIA) 50 MG tablet Take 1 tablet (50 mg total) by mouth daily. 09/10/16   Lucious Groves, DO  traMADol (ULTRAM) 50 MG tablet Take 1 tablet (50 mg total) by mouth every 6 (six) hours as needed. Patient not taking: Reported on  03/13/2018 08/30/17   Daleen Bo, MD    Family History Family History  Problem Relation Age of Onset  . Dementia Mother   . Colon cancer Neg Hx     Social History Social History   Tobacco Use  . Smoking status: Current Every Day Smoker    Packs/day: 0.30    Years: 44.00    Pack years: 13.20    Types: Cigarettes    Last attempt to quit: 01/2017    Years since quitting: 1.1  . Smokeless tobacco: Never Used  . Tobacco comment: started smoking in Nov 2019 few cigarettes a day  Substance Use Topics  . Alcohol use: Yes    Alcohol/week: 0.0 standard drinks    Comment: occ  . Drug use: No     Allergies   Atorvastatin; Gabapentin; and Nicorette [nicotine]   Review of Systems Review of Systems  All other systems reviewed and are negative.    Physical Exam Updated Vital Signs BP 133/71 (BP Location: Left Arm)   Pulse 80   Temp 97.9 F (36.6 C) (Oral)   Resp 17   LMP 02/22/2016   SpO2 96%   Physical Exam Vitals signs and nursing note reviewed.  Constitutional:      General: She is not in acute distress.    Appearance: She is well-developed.  HENT:     Head: Atraumatic.  Eyes:     Conjunctiva/sclera: Conjunctivae normal.  Neck:     Musculoskeletal: Neck supple.  Cardiovascular:     Rate and Rhythm: Normal rate and regular rhythm.  Pulmonary:     Effort: Pulmonary effort is normal.     Breath sounds: Normal breath sounds.  Abdominal:     Palpations: Abdomen is soft.     Tenderness: There is no abdominal tenderness.  Skin:    Findings: No rash.  Neurological:     Mental Status: She is alert and oriented to person, place, and time.      ED Treatments / Results  Labs (all labs ordered are listed, but only abnormal results are displayed) Labs Reviewed  CBC - Abnormal; Notable for the following components:      Result Value   RBC 3.79 (*)    Hemoglobin 10.2 (*)    HCT 32.1 (*)    All other  components within normal limits  BASIC METABOLIC PANEL -  Abnormal; Notable for the following components:   Sodium 130 (*)    CO2 21 (*)    Glucose, Bld 292 (*)    BUN 37 (*)    Creatinine, Ser 1.31 (*)    GFR calc non Af Amer 42 (*)    GFR calc Af Amer 49 (*)    All other components within normal limits  CBG MONITORING, ED - Abnormal; Notable for the following components:   Glucose-Capillary 358 (*)    All other components within normal limits  CBG MONITORING, ED - Abnormal; Notable for the following components:   Glucose-Capillary 270 (*)    All other components within normal limits  URINALYSIS, ROUTINE W REFLEX MICROSCOPIC    EKG None  Radiology Ct Angio Chest Pe W Or Wo Contrast  Result Date: 03/13/2018 CLINICAL DATA:  Pt c/o worsening back pain with difficulty breathing x weeks. 52 ml isovue 370 iv^49m ISOVUE-370 IOPAMIDOL (ISOVUE-370) INJECTION 76%PE suspected, high pretest prob EXAM: CT ANGIOGRAPHY CHEST WITH CONTRAST TECHNIQUE: Multidetector CT imaging of the chest was performed using the standard protocol during bolus administration of intravenous contrast. Multiplanar CT image reconstructions and MIPs were obtained to evaluate the vascular anatomy. CONTRAST:  788mISOVUE-370 IOPAMIDOL (ISOVUE-370) INJECTION 76% COMPARISON:  CT 10/05/2017 FINDINGS: Cardiovascular: No filling defects within the pulmonary arteries to suggest acute pulmonary embolism. No acute findings of the aorta or great vessels. No pericardial fluid. Pulmonary artery is enlarged at 35 mm. Mediastinum/Nodes: No axillary or supraclavicular adenopathy. No mediastinal hilar adenopathy. No pericardial effusion. Esophagus normal. Lungs/Pleura: No pulmonary infarction. No consolidation. Mild interstitial edema in the lower lobes. Small subpleural nodule in the RIGHT upper lobe measuring 4 mm (image 45/7). No change. Additional nodules along the RIGHT horizontal fissure less than 5 mm. Upper Abdomen: Limited view of the liver, kidneys, pancreas are unremarkable. Normal adrenal  glands. Musculoskeletal: Multiple levels of vertebral body sclerosis involving T2 through T6 are progressed from 03/24/2017 Review of the MIP images confirms the above findings. IMPRESSION: 1. No evidence acute pulmonary embolism. 2. Mild interstitial edema pattern. 3. Large pulmonary artery suggest pulmonary hypertension. 4. RIGHT upper lobe pulmonary nodule.  Unchanged. 5. Sclerosis of the upper thoracic spine vertebral bodies progressive from comparison exam 1 year prior. Favor degenerative change. Cannot exclude sclerotic skeletal metastasis. Electronically Signed   By: StSuzy Bouchard.D.   On: 03/13/2018 20:21    Procedures Procedures (including critical care time)  Medications Ordered in ED Medications  insulin aspart (novoLOG) injection 5 Units (5 Units Subcutaneous Given 03/15/18 2109)     Initial Impression / Assessment and Plan / ED Course  I have reviewed the triage vital signs and the nursing notes.  Pertinent labs & imaging results that were available during my care of the patient were reviewed by me and considered in my medical decision making (see chart for details).     BP 133/71 (BP Location: Left Arm)   Pulse 80   Temp 97.9 F (36.6 C) (Oral)   Resp 17   LMP 02/22/2016   SpO2 96%    Final Clinical Impressions(s) / ED Diagnoses   Final diagnoses:  Hyperglycemia    ED Discharge Orders    None     7:55 PM Patient here with concerns of hyperglycemia.  She was recently started on a steroid Dosepak for back pain.  CBG currently is 357, this is likely reflections of steroid induced.  She does not  have any other concerning features such as new infection.  Labs were ordered to rule out DKA, will recheck CBG.  9:13 PM Labs are reassuring, no evidence to suggest DKA.  CBG did improves with insulin.  Pt stable for discharge.  Recommend close monitoring of her CBG, and to altered her insulin appropriately.  Pt understand elevated CBG is 2/2 to prednisone.    Domenic Moras, PA-C 03/15/18 2114    Jola Schmidt, MD 03/15/18 2155

## 2018-03-15 NOTE — ED Notes (Signed)
I-Stat collected in addition to BMP, CBC. On rocker in [facility].

## 2018-03-15 NOTE — ED Notes (Signed)
Pt. Ambulated to bathroom; steady gait w/o help. Culture sent down with UA

## 2018-03-15 NOTE — ED Triage Notes (Signed)
Pt reports being seen for back pain and was prescribed prednisone and has since had higher blood sugars.

## 2018-03-15 NOTE — ED Notes (Signed)
Pt informed she needs to provide a urine sample when possible. Pt verbalized understanding but does not have to go at this time.

## 2018-03-16 NOTE — Telephone Encounter (Signed)
I am calling about your surgery that's scheduled for next week.  Have you had your history and physical completed by your primary care doctor and did you get clearance for your heart?  "I haven't done anything yet.  I go to see my doctor on the 23rd, which is tomorrow.  I couldn't get a sooner appointment.  I been running back and forth to the hospital because of my back.  I have hardly been able to walk.  They put me on a steroid for my back and that caused my glucose to go up so I had to go to the hospital yesterday about that.  They told me I could stop taking it or keep on taking the steroid.  I told them I would stay on it until I see my doctor tomorrow.  I'll get everything taken care of tomorrow."  I'll let Dr. March Rummage know.

## 2018-03-20 NOTE — Progress Notes (Signed)
Subjective:  Patient ID: [name], female    DOB: 1950-10-23,  MRN: [id number]  Chief Complaint  Patient presents with  . Foot Ulcer    right foot follow up; pt stated, "doing about the same; no other concerns"    68 y.o. female presents with the above complaint.   Review of Systems: Negative except as noted in the HPI. Denies N/V/F/Ch.  Past Medical History:  Diagnosis Date  . ANEMIA, CHRONIC DISEASE NEC 03/15/2006  . Carotid artery occlusion   . Cirrhosis ([place])   . Colon polyps   . Constipation due to opioid therapy 06/05/2014  . DEGENERATIVE JOINT DISEASE 05/06/2007   On going for >5 yrs No imaging to confirm Has tried ultram, mobic, neurontin which did not work Ambulance person tried once worked well    . Diabetes mellitus    Type II  . DIABETIC  RETINOPATHY 03/15/2006  . 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 Diastolic dysfunction 27/07/2374   Grade 1 by Echocardiogram 12/13/14  . Elevated liver enzymes   . Heart murmur   . Hemorrhoids   . History of kidney stones   . Hyperlipidemia 04/01/2011  . Hypertension   . Mild AI (aortic insufficiency)   . Moderate aortic regurgitation 09/25/2008   12/13/14 Echocardiogram    . Myalgia   . PAD (peripheral artery disease) ([place])   . PANCREATITIS, CHRONIC 03/29/2006  . Peripheral arterial disease ([place])   . Pneumonia    age [age]  . RENAL INSUFFICIENCY, CHRONIC 03/15/2006  . Tonsillar mass    Left    Current Outpatient Medications:  .  ACCU-CHEK AVIVA PLUS test strip, TEST three times a day, Disp: 100 each, Rfl: 11 .  amLODipine (NORVASC) 10 MG tablet, Take 10 mg by mouth daily., Disp: , Rfl:  .  aspirin EC 81 MG tablet, Take 1 tablet (81 mg total) by mouth daily., Disp: , Rfl:  .  Blood Glucose Monitoring Suppl (ACCU-CHEK AVIVA PLUS) w/Device KIT, Use to check you blood sugar 3 times a day, Disp: 1 kit, Rfl: 1 .  LANTUS SOLOSTAR 100 UNIT/ML Solostar Pen, Inject 12-14 Units into the skin daily after breakfast. , Disp: , Rfl: 5 .   LINZESS 145 MCG CAPS capsule, Take 145 mcg by mouth daily. , Disp: , Rfl:  .  meclizine (ANTIVERT) 25 MG tablet, Take 1 tablet (25 mg total) by mouth 3 (three) times daily as needed for dizziness., Disp: 30 tablet, Rfl: 0 .  Multiple Vitamins-Minerals (ONE-A-DAY WOMENS 50+ ADVANTAGE) TABS, Take 1 tablet by mouth daily with breakfast., Disp: , Rfl:  .  NONFORMULARY OR COMPOUNDED ITEM, Apply 1-2 g topically 4 (four) times daily. [facility]  Peripheral Neuropathy Cream- Bupivacaine 1%, Doxepin 3%, Gabapentin 6%, Pentoxifylline 3%, Topiramate 1% Apply 1-2 grams to affected area 3-4 times daily Qty. 120 gm 3 refills, Disp: 120 each, Rfl: 2 .  Pancrelipase, Lip-Prot-Amyl, 24000-76000 units CPEP, Take 4 capsules (96,000 Units total) by mouth See admin instructions. Take 4 capsules by mouth 2-3 times daily before meals (Patient taking differently: Take 2-4 capsules by mouth See admin instructions. Take 4 capsules by mouth two to three times a day with meals and 2 capsules with any snacks), Disp: 1170 capsule, Rfl: 3 .  PROAIR HFA 108 (90 Base) MCG/ACT inhaler, Inhale 2 puffs into the lungs 4 (four) times daily as needed for wheezing or shortness of breath., Disp: , Rfl: 3 .  ranitidine (ZANTAC) 150 MG tablet, Take 1 tablet (150 mg total)  by mouth daily. (Patient not taking: Reported on 03/13/2018), Disp: 90 tablet, Rfl: 3 .  rosuvastatin (CRESTOR) 20 MG tablet, Take 20 mg by mouth daily., Disp: , Rfl:  .  silver sulfADIAZINE (SILVADENE) 1 % cream, Apply 1 application topically daily. Apply topically daily to foot. (Patient taking differently: Apply 1 application topically See admin instructions. Apply to bottom of right foot as needed for irritation), Disp: 20 g, Rfl: 1 .  sitaGLIPtin (JANUVIA) 50 MG tablet, Take 1 tablet (50 mg total) by mouth daily., Disp: 12 tablet, Rfl: 0 .  traMADol (ULTRAM) 50 MG tablet, Take 1 tablet (50 mg total) by mouth [name] 6 (six) hours as needed. (Patient not taking: Reported  on 03/13/2018), Disp: 15 tablet, Rfl: 0 .  cyclobenzaprine (FLEXERIL) 10 MG tablet, Take 1 tablet (10 mg total) by mouth 3 (three) times daily as needed for muscle spasms., Disp: 12 tablet, Rfl: 0 .  diclofenac sodium (VOLTAREN) 1 % GEL, Apply 4 g topically 3 (three) times daily as needed., Disp: 100 g, Rfl: 0 .  HYDROcodone-acetaminophen (NORCO/VICODIN) 5-325 MG tablet, Take 1-2 tablets by mouth [name] 6 (six) hours as needed for moderate pain or severe pain., Disp: 15 tablet, Rfl: 0  Social History   Tobacco Use  Smoking Status Current [name] Day Smoker  . Packs/day: 0.30  . Years: 44.00  . Pack years: 13.20  . Types: Cigarettes  . Last attempt to quit: 01/2017  . Years since quitting: 1.1  Smokeless Tobacco Never Used  Tobacco Comment   started smoking in Nov 2019 few cigarettes a day    Allergies  Allergen Reactions  . Atorvastatin Other (See Comments)    Caused muscles in her back to be very sore  . Gabapentin Swelling    Legs and feet swell  . Nicorette [Nicotine] Nausea Only   Objective:  There were no vitals filed for this visit. There is no height or weight on file to calculate BMI. Constitutional Well developed. Well nourished.  Vascular Dorsalis pedis pulses palpable bilaterally. Posterior tibial pulses non-palpable bilaterally. Capillary refill normal to all digits.  No cyanosis or clubbing noted. Pedal hair growth normal.  Neurologic Normal speech. Oriented to person, place, and time. Epicritic sensation to light touch grossly present bilaterally.  Dermatologic Nails well groomed and normal in appearance. No skin lesions. Would R 5th MPJ 0.1x0.1 post-debridement Punctate keratoses sub-met 1 5 heel left, first MPJ right  Orthopedic: Normal joint ROM without pain or crepitus bilaterally. No visible deformities. Pain on palpation right fifth MPJ   Radiographs: None today Assessment:   1. Diabetic ulcer of right midfoot associated with diabetes mellitus due  to underlying condition, limited to breakdown of skin ([place])   2. DM type 2 with diabetic peripheral neuropathy ([place])   3. Deformity of metatarsal bone of right foot   4. Capsulitis    Plan:  Patient was evaluated and treated and all questions answered.  Ulcer R 5th MPJ -Due to the chronicity of the ulceration discussed with patient again proceeding with excision of the metatarsal head for curative offloading the ulceration.  Would consider concomitant adjacent tissue transfer via local advancement flap to provide soft tissue coverage of the ulceration.  Patient amenable and would like to proceed we will need PCP and cardiology clearance prior to surgery as she has had chest pain within the past 6 months.  Will have procedure performed at the hospital.  All risks of terms of surgery discussed the patient -Patient has failed  all conservative therapy and wishes to proceed with surgical intervention. All risks, benefits, and alternatives discussed with patient. No guarantees given. Consent reviewed and signed by patient. -Planned procedures: Right fifth metatarsal head excision, adjacent tissue transfer -Risk factors: Diabetes, neuropathy  No follow-ups on file.

## 2018-03-21 NOTE — Telephone Encounter (Signed)
"  I was checking in on [name], scheduled for this Wednesday with Dr. March Rummage.  I did see a note that she was cancelling but she still is on our grid.  Just making sure before I go on that she is canceled for this Wednesday.  So just let me know sure that she's going to be done.  She has not gotten Cardiac clearance and her surgery is still out of control too.  So, I'm just checking in."

## 2018-03-21 NOTE — Telephone Encounter (Signed)
[name] in our call center stated [name] called and stated she wants to cancel her surgery because her she can't get her glucose under control.  I told her I'd call and cancel the surgery.

## 2018-03-22 NOTE — Telephone Encounter (Signed)
I canceled her surgery.

## 2018-03-23 ENCOUNTER — Encounter (HOSPITAL_BASED_OUTPATIENT_CLINIC_OR_DEPARTMENT_OTHER): Admission: RE | Payer: Self-pay | Source: Home / Self Care

## 2018-03-23 ENCOUNTER — Ambulatory Visit (HOSPITAL_BASED_OUTPATIENT_CLINIC_OR_DEPARTMENT_OTHER): Admission: RE | Admit: 2018-03-23 | Payer: Medicare Other | Source: Home / Self Care | Admitting: Podiatry

## 2018-03-23 SURGERY — OSTEOTOMY, METATARSAL BONE
Anesthesia: Monitor Anesthesia Care | Laterality: Right

## 2018-03-24 ENCOUNTER — Ambulatory Visit (INDEPENDENT_AMBULATORY_CARE_PROVIDER_SITE_OTHER): Payer: Medicare Other | Admitting: Podiatry

## 2018-03-24 DIAGNOSIS — Q828 Other specified congenital malformations of skin: Secondary | ICD-10-CM

## 2018-03-24 DIAGNOSIS — E08621 Diabetes mellitus due to underlying condition with foot ulcer: Secondary | ICD-10-CM

## 2018-03-24 DIAGNOSIS — L97411 Non-pressure chronic ulcer of right heel and midfoot limited to breakdown of skin: Secondary | ICD-10-CM | POA: Diagnosis not present

## 2018-03-24 DIAGNOSIS — E1151 Type 2 diabetes mellitus with diabetic peripheral angiopathy without gangrene: Secondary | ICD-10-CM | POA: Diagnosis not present

## 2018-03-24 NOTE — Progress Notes (Signed)
Subjective:  Patient ID: [name], female    DOB: July 27, 1950,  MRN: [id number]  Chief Complaint  Patient presents with  . Foot Problem    wound check right foot bleeding 5th toe submet    68 y.o. female presents with the above complaint. Could not proceed with surgery due to her sugars being out of control.  Review of Systems: Negative except as noted in the HPI. Denies N/V/F/Ch.  Past Medical History:  Diagnosis Date  . ANEMIA, CHRONIC DISEASE NEC 03/15/2006  . Carotid artery occlusion   . Cirrhosis ([place])   . Colon polyps   . Constipation due to opioid therapy 06/05/2014  . DEGENERATIVE JOINT DISEASE 05/06/2007   On going for >5 yrs No imaging to confirm Has tried ultram, mobic, neurontin which did not work Ambulance person tried once worked well    . Diabetes mellitus    Type II  . DIABETIC  RETINOPATHY 03/15/2006  . 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 Diastolic dysfunction 15/17/6160   Grade 1 by Echocardiogram 12/13/14  . Elevated liver enzymes   . Heart murmur   . Hemorrhoids   . History of kidney stones   . Hyperlipidemia 04/01/2011  . Hypertension   . Mild AI (aortic insufficiency)   . Moderate aortic regurgitation 09/25/2008   12/13/14 Echocardiogram    . Myalgia   . PAD (peripheral artery disease) ([place])   . PANCREATITIS, CHRONIC 03/29/2006  . Peripheral arterial disease ([place])   . Pneumonia    age 26-20  . RENAL INSUFFICIENCY, CHRONIC 03/15/2006  . Tonsillar mass    Left    Current Outpatient Medications:  .  ACCU-CHEK AVIVA PLUS test strip, TEST three times a day, Disp: 100 each, Rfl: 11 .  amLODipine (NORVASC) 10 MG tablet, Take 10 mg by mouth daily., Disp: , Rfl:  .  aspirin EC 81 MG tablet, Take 1 tablet (81 mg total) by mouth daily., Disp: , Rfl:  .  Blood Glucose Monitoring Suppl (ACCU-CHEK AVIVA PLUS) w/Device KIT, Use to check you blood sugar 3 times a day, Disp: 1 kit, Rfl: 1 .  cyclobenzaprine (FLEXERIL) 10 MG tablet, Take 1 tablet (10 mg total) by  mouth 3 (three) times daily as needed for muscle spasms., Disp: 12 tablet, Rfl: 0 .  diclofenac sodium (VOLTAREN) 1 % GEL, Apply 4 g topically 3 (three) times daily as needed., Disp: 100 g, Rfl: 0 .  HYDROcodone-acetaminophen (NORCO/VICODIN) 5-325 MG tablet, Take 1-2 tablets by mouth [name] 6 (six) hours as needed for moderate pain or severe pain., Disp: 15 tablet, Rfl: 0 .  LANTUS SOLOSTAR 100 UNIT/ML Solostar Pen, Inject 12-14 Units into the skin daily after breakfast. , Disp: , Rfl: 5 .  LINZESS 145 MCG CAPS capsule, Take 145 mcg by mouth daily. , Disp: , Rfl:  .  meclizine (ANTIVERT) 25 MG tablet, Take 1 tablet (25 mg total) by mouth 3 (three) times daily as needed for dizziness., Disp: 30 tablet, Rfl: 0 .  Multiple Vitamins-Minerals (ONE-A-DAY WOMENS 50+ ADVANTAGE) TABS, Take 1 tablet by mouth daily with breakfast., Disp: , Rfl:  .  NONFORMULARY OR COMPOUNDED ITEM, Apply 1-2 g topically 4 (four) times daily. [facility]  Peripheral Neuropathy Cream- Bupivacaine 1%, Doxepin 3%, Gabapentin 6%, Pentoxifylline 3%, Topiramate 1% Apply 1-2 grams to affected area 3-4 times daily Qty. 120 gm 3 refills, Disp: 120 each, Rfl: 2 .  Pancrelipase, Lip-Prot-Amyl, 24000-76000 units CPEP, Take 4 capsules (96,000 Units total) by mouth See admin instructions. Take  4 capsules by mouth 2-3 times daily before meals (Patient taking differently: Take 2-4 capsules by mouth See admin instructions. Take 4 capsules by mouth two to three times a day with meals and 2 capsules with any snacks), Disp: 1170 capsule, Rfl: 3 .  PROAIR HFA 108 (90 Base) MCG/ACT inhaler, Inhale 2 puffs into the lungs 4 (four) times daily as needed for wheezing or shortness of breath., Disp: , Rfl: 3 .  ranitidine (ZANTAC) 150 MG tablet, Take 1 tablet (150 mg total) by mouth daily., Disp: 90 tablet, Rfl: 3 .  rosuvastatin (CRESTOR) 20 MG tablet, Take 20 mg by mouth daily., Disp: , Rfl:  .  silver sulfADIAZINE (SILVADENE) 1 % cream, Apply 1  application topically daily. Apply topically daily to foot. (Patient taking differently: Apply 1 application topically See admin instructions. Apply to bottom of right foot as needed for irritation), Disp: 20 g, Rfl: 1 .  sitaGLIPtin (JANUVIA) 50 MG tablet, Take 1 tablet (50 mg total) by mouth daily., Disp: 12 tablet, Rfl: 0 .  traMADol (ULTRAM) 50 MG tablet, Take 1 tablet (50 mg total) by mouth [name] 6 (six) hours as needed., Disp: 15 tablet, Rfl: 0  Social History   Tobacco Use  Smoking Status Current [name] Day Smoker  . Packs/day: 0.30  . Years: 44.00  . Pack years: 13.20  . Types: Cigarettes  . Last attempt to quit: 01/2017  . Years since quitting: 1.1  Smokeless Tobacco Never Used  Tobacco Comment   started smoking in Nov 2019 few cigarettes a day    Allergies  Allergen Reactions  . Atorvastatin Other (See Comments)    Caused muscles in her back to be very sore  . Gabapentin Swelling    Legs and feet swell  . Nicorette [Nicotine] Nausea Only   Objective:  There were no vitals filed for this visit. There is no height or weight on file to calculate BMI. Constitutional Well developed. Well nourished.  Vascular Dorsalis pedis pulses palpable bilaterally. Posterior tibial pulses non-palpable bilaterally. Capillary refill normal to all digits.  No cyanosis or clubbing noted. Pedal hair growth normal.  Neurologic Normal speech. Oriented to person, place, and time. Epicritic sensation to light touch grossly present bilaterally.  Dermatologic Nails well groomed and normal in appearance. No skin lesions. Would R 5th MPJ 1x1.5 post-debridement Punctate keratoses sub-met 1 5 heel left, first MPJ right  Orthopedic: Normal joint ROM without pain or crepitus bilaterally. No visible deformities. Pain on palpation right fifth MPJ   Radiographs: None today Assessment:   1. Diabetic ulcer of right midfoot associated with diabetes mellitus due to underlying condition, limited to  breakdown of skin ([place])   2. Porokeratosis   3. Diabetes mellitus type 2 with peripheral artery disease ([place])    Plan:  Patient was evaluated and treated and all questions answered.  Ulcer R 5th MPJ -Patient had hold off the surgery due to other issues such as her back issues unable to control her diabetes.  We will plan for surgery later today. -Ulcer debrided as below -Offloading boot dispensed for safe offloading of the ulceration  Procedure: Excisional Debridement of Wound Rationale: Removal of non-viable soft tissue from the wound to promote healing.  Anesthesia: none Pre-Debridement Wound Measurements: 0.5 cm x 0.5 cm x 0.2 cm  Post-Debridement Wound Measurements: 1 cm x 1.5 cm x 0.3 cm  Type of Debridement: Sharp Excisional Tissue Removed: Non-viable soft tissue Depth of Debridement: subcutaneous tissue. Technique: Sharp excisional debridement to  bleeding, viable wound base.  Dressing: Dry, sterile, compression dressing. Disposition: Patient tolerated procedure well. Patient to return in 1 week for follow-up.  Pre-ulcerative lesions in the setting of diabetes and PAD -Lesions debrided x3 with 15 blade without incident     No follow-ups on file.

## 2018-03-25 ENCOUNTER — Other Ambulatory Visit: Payer: Medicare Other

## 2018-03-28 ENCOUNTER — Encounter (HOSPITAL_COMMUNITY): Payer: Self-pay

## 2018-03-28 ENCOUNTER — Emergency Department (HOSPITAL_COMMUNITY)
Admission: EM | Admit: 2018-03-28 | Discharge: 2018-03-28 | Disposition: A | Discharge status: Home / Self Care | Payer: Medicare Other | Attending: Emergency Medicine | Admitting: Emergency Medicine

## 2018-03-28 DIAGNOSIS — E119 Type 2 diabetes mellitus without complications: Secondary | ICD-10-CM | POA: Insufficient documentation

## 2018-03-28 DIAGNOSIS — R1013 Epigastric pain: Secondary | ICD-10-CM | POA: Insufficient documentation

## 2018-03-28 DIAGNOSIS — Z7982 Long term (current) use of aspirin: Secondary | ICD-10-CM | POA: Diagnosis not present

## 2018-03-28 DIAGNOSIS — F17210 Nicotine dependence, cigarettes, uncomplicated: Secondary | ICD-10-CM | POA: Diagnosis not present

## 2018-03-28 DIAGNOSIS — Z79899 Other long term (current) drug therapy: Secondary | ICD-10-CM | POA: Insufficient documentation

## 2018-03-28 DIAGNOSIS — N183 Chronic kidney disease, stage 3 (moderate): Secondary | ICD-10-CM | POA: Diagnosis not present

## 2018-03-28 DIAGNOSIS — I129 Hypertensive chronic kidney disease with stage 1 through stage 4 chronic kidney disease, or unspecified chronic kidney disease: Secondary | ICD-10-CM | POA: Insufficient documentation

## 2018-03-28 LAB — COMPREHENSIVE METABOLIC PANEL
ALT: 44 U/L (ref 0–44)
AST: 36 U/L (ref 15–41)
Albumin: 2.9 g/dL — ABNORMAL LOW (ref 3.5–5.0)
Alkaline Phosphatase: 102 U/L (ref 38–126)
Anion gap: 8 (ref 5–15)
BUN: 30 mg/dL — ABNORMAL HIGH (ref 8–23)
CO2: 21 mmol/L — ABNORMAL LOW (ref 22–32)
Calcium: 8.7 mg/dL — ABNORMAL LOW (ref 8.9–10.3)
Chloride: 109 mmol/L (ref 98–111)
Creatinine, Ser: 1.59 mg/dL — ABNORMAL HIGH (ref 0.44–1.00)
GFR calc Af Amer: 39 mL/min — ABNORMAL LOW (ref >60)
GFR calc non Af Amer: 33 mL/min — ABNORMAL LOW (ref >60)
Glucose, Bld: 252 mg/dL — ABNORMAL HIGH (ref 70–99)
Potassium: 4.7 mmol/L (ref 3.5–5.1)
Sodium: 138 mmol/L (ref 135–145)
Total Bilirubin: 0.4 mg/dL (ref 0.3–1.2)
Total Protein: 7.2 g/dL (ref 6.5–8.1)

## 2018-03-28 LAB — URINALYSIS, ROUTINE W REFLEX MICROSCOPIC
Bacteria, UA: NONE SEEN
Bilirubin Urine: NEGATIVE
Glucose, UA: NEGATIVE mg/dL
Hgb urine dipstick: NEGATIVE
Ketones, ur: NEGATIVE mg/dL
NITRITE: NEGATIVE
Protein, ur: 30 mg/dL — AB
Specific Gravity, Urine: 1.012 (ref 1.005–1.030)
pH: 5 (ref 5.0–8.0)

## 2018-03-28 LAB — CBC
HCT: 33 % — ABNORMAL LOW (ref 36.0–46.0)
Hemoglobin: 10.1 g/dL — ABNORMAL LOW (ref 12.0–15.0)
MCH: 26.9 pg (ref 26.0–34.0)
MCHC: 30.6 g/dL (ref 30.0–36.0)
MCV: 88 fL (ref 80.0–100.0)
Platelets: 164 10*3/uL (ref 150–400)
RBC: 3.75 MIL/uL — ABNORMAL LOW (ref 3.87–5.11)
RDW: 15.1 % (ref 11.5–15.5)
WBC: 5.9 10*3/uL (ref 4.0–10.5)
nRBC: 0 % (ref 0.0–0.2)

## 2018-03-28 LAB — LIPASE, BLOOD: Lipase: 14 U/L (ref 11–51)

## 2018-03-28 MED ORDER — SUCRALFATE 1 G PO TABS: 1.0000 g | ORAL_TABLET | Freq: Three times a day (TID) | ORAL | 0 refills | Status: DC

## 2018-03-28 MED ORDER — FAMOTIDINE IN NACL 20-0.9 MG/50ML-% IV SOLN
20.0000 mg | Freq: Once | INTRAVENOUS | Status: DC
  Filled 2018-03-28: qty 50

## 2018-03-28 MED ORDER — ALUM & MAG HYDROXIDE-SIMETH 200-200-20 MG/5ML PO SUSP
30.0000 mL | Freq: Once | ORAL | Status: AC
  Administered 2018-03-28: 30 mL via ORAL
  Filled 2018-03-28: qty 30

## 2018-03-28 MED ORDER — FAMOTIDINE 20 MG PO TABS
20.0000 mg | ORAL_TABLET | Freq: Once | ORAL | Status: AC
  Administered 2018-03-28: 20 mg via ORAL
  Filled 2018-03-28: qty 1

## 2018-03-28 NOTE — ED Notes (Signed)
Patient verbalizes understanding of discharge instructions. Opportunity for questioning and answers were provided. 

## 2018-03-28 NOTE — ED Triage Notes (Signed)
Pt endorses mid abd pain x 2 days after eating cereal with milk. Pt has hx of pancreatitis, denies alcohol use. VSS.

## 2018-03-28 NOTE — ED Provider Notes (Signed)
[facility] EMERGENCY DEPARTMENT Provider Note   CSN: [id number] Arrival date & time: 03/28/18  1044     History   Chief Complaint Chief Complaint  Patient presents with  . Abdominal Pain    HPI Nautika C Gambino is a 68 y.o. female.  Patient is a 68 year old female who presents with upper abdominal pain.  She has a history of prior alcoholic pancreatitis but states she has not had problems with that in years.  She does have a history of diabetes and cirrhosis.  She states that she also has some bad reflux.  She has been having pain to her upper abdomen for the last 2 days.  She has some increase in her gas and acid reflux.  She denies any nausea or vomiting.  No change in bowels.  No urinary symptoms.  No fevers.  She has not taken anything at home for the symptoms.     Past Medical History:  Diagnosis Date  . ANEMIA, CHRONIC DISEASE NEC 03/15/2006  . Carotid artery occlusion   . Cirrhosis ([place])   . Colon polyps   . Constipation due to opioid therapy 06/05/2014  . DEGENERATIVE JOINT DISEASE 05/06/2007   On going for >5 yrs No imaging to confirm Has tried ultram, mobic, neurontin which did not work Ambulance person tried once worked well    . Diabetes mellitus    Type II  . DIABETIC  RETINOPATHY 03/15/2006  . 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 Diastolic dysfunction 27/04/5007   Grade 1 by Echocardiogram 12/13/14  . Elevated liver enzymes   . Heart murmur   . Hemorrhoids   . History of kidney stones   . Hyperlipidemia 04/01/2011  . Hypertension   . Mild AI (aortic insufficiency)   . Moderate aortic regurgitation 09/25/2008   12/13/14 Echocardiogram    . Myalgia   . PAD (peripheral artery disease) ([place])   . PANCREATITIS, CHRONIC 03/29/2006  . Peripheral arterial disease ([place])   . Pneumonia    age 6-20  . RENAL INSUFFICIENCY, CHRONIC 03/15/2006  . Tonsillar mass    Left    Patient Active Problem List   Diagnosis Date Noted  . Recurrent epistaxis  05/31/2017  . Throat pain in adult 05/31/2017  . Tubular adenoma of colon 06/30/2016  . Diverticulosis of colon without hemorrhage 06/30/2016  . Postmenopausal vaginal bleeding   . Long term (current) use of opiate analgesic 04/09/2016  . Bilateral carotid artery disease ([place]) 11/28/2015  . Pancreatic insufficiency 05/09/2015  . 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 Risk for coronary artery disease greater than 20% in next 10 years 03/14/2015  . Diastolic dysfunction 38/18/2993  . Vulvar cyst 01/23/2015  . Abnormal uterine bleeding 12/02/2014  . Unexplained night sweats 12/02/2014  . Constipation due to opioid therapy 06/05/2014  . Loss of weight 03/29/2014  . Peripheral arterial disease ([place]) 12/12/2013  . GERD (gastroesophageal reflux disease) 10/12/2012  . Healthcare maintenance 09/29/2012  . Back pain 03/28/2012  . Hyperlipidemia 04/01/2011  . BOILS, RECURRENT 03/13/2010  . Moderate aortic regurgitation 09/25/2008  . HEMORRHOIDS 06/08/2008  . Osteoarthritis 05/06/2007  . TOBACCO ABUSE 09/27/2006  . History of osteoporosis 06/29/2006  . Chronic pancreatitis ([place]) 03/29/2006  . Uncontrolled type 2 diabetes mellitus with chronic kidney disease, without long-term current use of insulin ([place]) 03/15/2006  . Diabetic neuropathy, painful ([place]) 03/15/2006  . Anemia of chronic disease 03/15/2006  . Essential hypertension 03/15/2006  . VENTRICULAR HYPERTROPHY, LEFT 03/15/2006  . CKD stage  3 due to type 2 diabetes mellitus ([place]) 03/15/2006    Past Surgical History:  Procedure Laterality Date  . BREAST SURGERY    . COLONOSCOPY    . DILATION AND CURETTAGE OF UTERUS N/A 06/16/2016   Procedure: DILATATION AND CURETTAGE;  Surgeon: Mora Bellman, MD;  Location: [facility];  Service: Gynecology;  Laterality: N/A;  . HYSTEROSCOPY N/A 06/16/2016   Procedure: HYSTEROSCOPY;  Surgeon: Mora Bellman, MD;  Location: [facility];  Service: Gynecology;  Laterality: N/A;  . MOUTH SURGERY N/A   .  POLYPECTOMY N/A 06/16/2016   Procedure: POLYPECTOMY;  Surgeon: Mora Bellman, MD;  Location: [facility];  Service: Gynecology;  Laterality: N/A;     OB History    Gravida  3   Para  3   Term  3   Preterm      AB      Living  3     SAB      TAB      Ectopic      Multiple      Live Births  3            Home Medications    Prior to Admission medications   Medication Sig Start Date End Date Taking? Authorizing Provider  ACCU-CHEK AVIVA PLUS test strip TEST three times a day 11/25/15   Lucious Groves, DO  amLODipine (NORVASC) 10 MG tablet Take 10 mg by mouth daily. 06/01/17   [provider]  aspirin EC 81 MG tablet Take 1 tablet (81 mg total) by mouth daily. 04/09/16   Lucious Groves, DO  Blood Glucose Monitoring Suppl (ACCU-CHEK AVIVA PLUS) w/Device KIT Use to check you blood sugar 3 times a day 06/04/15   Oval Linsey, MD  cyclobenzaprine (FLEXERIL) 10 MG tablet Take 1 tablet (10 mg total) by mouth 3 (three) times daily as needed for muscle spasms. 02/27/18   Virgel Manifold, MD  diclofenac sodium (VOLTAREN) 1 % GEL Apply 4 g topically 3 (three) times daily as needed. 03/13/18   Duffy Bruce, MD  HYDROcodone-acetaminophen (NORCO/VICODIN) 5-325 MG tablet Take 1-2 tablets by mouth every 6 (six) hours as needed for moderate pain or severe pain. 03/13/18   Duffy Bruce, MD  LANTUS SOLOSTAR 100 UNIT/ML Solostar Pen Inject 12-14 Units into the skin daily after breakfast.  05/04/17   [provider]  LINZESS 145 MCG CAPS capsule Take 145 mcg by mouth daily.  03/07/17   [provider]  meclizine (ANTIVERT) 25 MG tablet Take 1 tablet (25 mg total) by mouth 3 (three) times daily as needed for dizziness. 6/31/49   Delora Fuel, MD  Multiple Vitamins-Minerals (ONE-A-DAY WOMENS 50+ ADVANTAGE) TABS Take 1 tablet by mouth daily with breakfast.    [provider]  NONFORMULARY OR COMPOUNDED ITEM Apply 1-2 g topically 4 (four) times daily. [facility]  Peripheral Neuropathy Cream- Bupivacaine 1%, Doxepin 3%, Gabapentin 6%, Pentoxifylline 3%, Topiramate 1% Apply 1-2 grams to affected area 3-4 times daily Qty. 120 gm 3 refills 12/03/17   Evelina Bucy, DPM  Pancrelipase, Lip-Prot-Amyl, 24000-76000 units CPEP Take 4 capsules (96,000 Units total) by mouth See admin instructions. Take 4 capsules by mouth 2-3 times daily before meals Patient taking differently: Take 2-4 capsules by mouth See admin instructions. Take 4 capsules by mouth two to three times a day with meals and 2 capsules with any snacks 03/10/16   Lucious Groves, DO  PROAIR HFA 108 [phone] Base) MCG/ACT inhaler Inhale 2 puffs  into the lungs 4 (four) times daily as needed for wheezing or shortness of breath. 04/30/17   [provider]  ranitidine (ZANTAC) 150 MG tablet Take 1 tablet (150 mg total) by mouth daily. 01/18/18   Irene Shipper, MD  rosuvastatin (CRESTOR) 20 MG tablet Take 20 mg by mouth daily. 10/03/17   [provider]  silver sulfADIAZINE (SILVADENE) 1 % cream Apply 1 application topically daily. Apply topically daily to foot. Patient taking differently: Apply 1 application topically See admin instructions. Apply to bottom of right foot as needed for irritation 08/19/17   Price, Christian Mate, DPM  sitaGLIPtin (JANUVIA) 50 MG tablet Take 1 tablet (50 mg total) by mouth daily. 09/10/16   Lucious Groves, DO  sucralfate (CARAFATE) 1 g tablet Take 1 tablet (1 g total) by mouth 4 (four) times daily -  with meals and at bedtime. 03/28/18   Malvin Johns, MD  traMADol (ULTRAM) 50 MG tablet Take 1 tablet (50 mg total) by mouth every 6 (six) hours as needed. 08/30/17   Daleen Bo, MD    Family History Family History  Problem Relation Age of Onset  . Dementia Mother   . Colon cancer Neg Hx     Social History Social History   Tobacco Use  . Smoking status: Current Every Day Smoker    Packs/day: 0.30    Years: 44.00    Pack years: 13.20    Types:  Cigarettes    Last attempt to quit: 01/2017    Years since quitting: 1.1  . Smokeless tobacco: Never Used  . Tobacco comment: started smoking in Nov 2019 few cigarettes a day  Substance Use Topics  . Alcohol use: Yes    Alcohol/week: 0.0 standard drinks    Comment: occ  . Drug use: No     Allergies   Atorvastatin; Gabapentin; and Nicorette [nicotine]   Review of Systems Review of Systems  Constitutional: Negative for chills, diaphoresis, fatigue and fever.  HENT: Negative for congestion, rhinorrhea and sneezing.   Eyes: Negative.   Respiratory: Negative for cough, chest tightness and shortness of breath.   Cardiovascular: Negative for chest pain and leg swelling.  Gastrointestinal: Positive for abdominal pain. Negative for blood in stool, diarrhea, nausea and vomiting.  Genitourinary: Negative for difficulty urinating, flank pain, frequency and hematuria.  Musculoskeletal: Negative for arthralgias and back pain.  Skin: Negative for rash.  Neurological: Negative for dizziness, speech difficulty, weakness, numbness and headaches.     Physical Exam Updated Vital Signs BP 121/68 (BP Location: Right Arm)   Pulse 73   Temp 98.4 F (36.9 C) (Oral)   Resp (!) 22   LMP 02/22/2016   SpO2 98%   Physical Exam Constitutional:      Appearance: She is well-developed.  HENT:     Head: Normocephalic and atraumatic.  Eyes:     Pupils: Pupils are equal, round, and reactive to light.  Neck:     Musculoskeletal: Normal range of motion and neck supple.  Cardiovascular:     Rate and Rhythm: Normal rate and regular rhythm.     Heart sounds: Murmur present.  Pulmonary:     Effort: Pulmonary effort is normal. No respiratory distress.     Breath sounds: Normal breath sounds. No wheezing or rales.  Chest:     Chest wall: No tenderness.  Abdominal:     General: Bowel sounds are normal.     Palpations: Abdomen is soft.     Tenderness: There  is no abdominal tenderness. There is no  guarding or rebound.  Musculoskeletal: Normal range of motion.  Lymphadenopathy:     Cervical: No cervical adenopathy.  Skin:    General: Skin is warm and dry.     Findings: No rash.  Neurological:     Mental Status: She is alert and oriented to person, place, and time.      ED Treatments / Results  Labs (all labs ordered are listed, but only abnormal results are displayed) Labs Reviewed  COMPREHENSIVE METABOLIC PANEL - Abnormal; Notable for the following components:      Result Value   CO2 21 (*)    Glucose, Bld 252 (*)    BUN 30 (*)    Creatinine, Ser 1.59 (*)    Calcium 8.7 (*)    Albumin 2.9 (*)    GFR calc non Af Amer 33 (*)    GFR calc Af Amer 39 (*)    All other components within normal limits  CBC - Abnormal; Notable for the following components:   RBC 3.75 (*)    Hemoglobin 10.1 (*)    HCT 33.0 (*)    All other components within normal limits  URINALYSIS, ROUTINE W REFLEX MICROSCOPIC - Abnormal; Notable for the following components:   Protein, ur 30 (*)    Leukocytes, UA TRACE (*)    All other components within normal limits  LIPASE, BLOOD    EKG None  Radiology No results found.  Procedures Procedures (including critical care time)  Medications Ordered in ED Medications  alum & mag hydroxide-simeth (MAALOX/MYLANTA) 200-200-20 MG/5ML suspension 30 mL (30 mLs Oral Given 03/28/18 1623)  famotidine (PEPCID) tablet 20 mg (20 mg Oral Given 03/28/18 1623)     Initial Impression / Assessment and Plan / ED Course  I have reviewed the triage vital signs and the nursing notes.  Pertinent labs & imaging results that were available during my care of the patient were reviewed by me and considered in my medical decision making (see chart for details).     Pt is a 68yo who presents with some abdominal discomfort.  She has no abdominal tenderness on exam.  She is feeling a lot of gas and acid reflux.  She was given Maalox and Pepcid in the ED and states that her  symptoms have completely resolved.  Her labs are similar to prior values.  Her LFTs are normal.  Her lipase is normal.  She was reexamined and has no abdominal tenderness on exam.  I do not feel that further imaging is indicated at this point.  She was discharged home in good condition.  She is on Zantac.  She was given a prescription for Carafate.  She was encouraged to have a follow-up with her primary care provider.  Return precautions were given.  Final Clinical Impressions(s) / ED Diagnoses   Final diagnoses:  Epigastric pain    ED Discharge Orders         Ordered    sucralfate (CARAFATE) 1 g tablet  3 times daily with meals & bedtime     03/28/18 1727           Malvin Johns, MD 03/28/18 1733

## 2018-03-31 ENCOUNTER — Telehealth: Payer: Self-pay | Admitting: Internal Medicine

## 2018-03-31 ENCOUNTER — Other Ambulatory Visit: Payer: Self-pay

## 2018-03-31 ENCOUNTER — Inpatient Hospital Stay (HOSPITAL_COMMUNITY)
Admission: EM | Admit: 2018-03-31 | Discharge: 2018-04-02 | DRG: 440 | Disposition: A | Discharge status: Home / Self Care | Payer: Medicare Other | Attending: Internal Medicine | Admitting: Internal Medicine

## 2018-03-31 ENCOUNTER — Encounter (HOSPITAL_COMMUNITY): Payer: Self-pay | Admitting: *Deleted

## 2018-03-31 ENCOUNTER — Emergency Department (HOSPITAL_COMMUNITY): Payer: Medicare Other

## 2018-03-31 DIAGNOSIS — I10 Essential (primary) hypertension: Secondary | ICD-10-CM | POA: Diagnosis present

## 2018-03-31 DIAGNOSIS — K859 Acute pancreatitis without necrosis or infection, unspecified: Secondary | ICD-10-CM

## 2018-03-31 DIAGNOSIS — F17210 Nicotine dependence, cigarettes, uncomplicated: Secondary | ICD-10-CM | POA: Diagnosis present

## 2018-03-31 DIAGNOSIS — E1142 Type 2 diabetes mellitus with diabetic polyneuropathy: Secondary | ICD-10-CM | POA: Diagnosis present

## 2018-03-31 DIAGNOSIS — E1151 Type 2 diabetes mellitus with diabetic peripheral angiopathy without gangrene: Secondary | ICD-10-CM | POA: Diagnosis present

## 2018-03-31 DIAGNOSIS — E1122 Type 2 diabetes mellitus with diabetic chronic kidney disease: Secondary | ICD-10-CM | POA: Diagnosis present

## 2018-03-31 DIAGNOSIS — Z794 Long term (current) use of insulin: Secondary | ICD-10-CM

## 2018-03-31 DIAGNOSIS — K8591 Acute pancreatitis with uninfected necrosis, unspecified: Principal | ICD-10-CM | POA: Diagnosis present

## 2018-03-31 DIAGNOSIS — K8689 Other specified diseases of pancreas: Secondary | ICD-10-CM | POA: Diagnosis present

## 2018-03-31 DIAGNOSIS — N183 Chronic kidney disease, stage 3 (moderate): Secondary | ICD-10-CM | POA: Diagnosis present

## 2018-03-31 DIAGNOSIS — Z7982 Long term (current) use of aspirin: Secondary | ICD-10-CM

## 2018-03-31 DIAGNOSIS — K861 Other chronic pancreatitis: Secondary | ICD-10-CM | POA: Diagnosis present

## 2018-03-31 DIAGNOSIS — E1165 Type 2 diabetes mellitus with hyperglycemia: Secondary | ICD-10-CM

## 2018-03-31 DIAGNOSIS — IMO0002 Reserved for concepts with insufficient information to code with codable children: Secondary | ICD-10-CM | POA: Diagnosis present

## 2018-03-31 DIAGNOSIS — I129 Hypertensive chronic kidney disease with stage 1 through stage 4 chronic kidney disease, or unspecified chronic kidney disease: Secondary | ICD-10-CM | POA: Diagnosis present

## 2018-03-31 DIAGNOSIS — K5900 Constipation, unspecified: Secondary | ICD-10-CM | POA: Diagnosis present

## 2018-03-31 LAB — CBC
HCT: 31.3 % — ABNORMAL LOW (ref 36.0–46.0)
Hemoglobin: 9.7 g/dL — ABNORMAL LOW (ref 12.0–15.0)
MCH: 26.9 pg (ref 26.0–34.0)
MCHC: 31 g/dL (ref 30.0–36.0)
MCV: 86.7 fL (ref 80.0–100.0)
Platelets: 160 10*3/uL (ref 150–400)
RBC: 3.61 MIL/uL — AB (ref 3.87–5.11)
RDW: 14.7 % (ref 11.5–15.5)
WBC: 5.6 10*3/uL (ref 4.0–10.5)
nRBC: 0 % (ref 0.0–0.2)

## 2018-03-31 LAB — COMPREHENSIVE METABOLIC PANEL
ALT: 45 U/L — ABNORMAL HIGH (ref 0–44)
ANION GAP: 8 (ref 5–15)
AST: 39 U/L (ref 15–41)
Albumin: 2.8 g/dL — ABNORMAL LOW (ref 3.5–5.0)
Alkaline Phosphatase: 100 U/L (ref 38–126)
BUN: 25 mg/dL — ABNORMAL HIGH (ref 8–23)
CO2: 21 mmol/L — ABNORMAL LOW (ref 22–32)
Calcium: 8.8 mg/dL — ABNORMAL LOW (ref 8.9–10.3)
Chloride: 110 mmol/L (ref 98–111)
Creatinine, Ser: 1.38 mg/dL — ABNORMAL HIGH (ref 0.44–1.00)
GFR calc non Af Amer: 40 mL/min — ABNORMAL LOW (ref >60)
GFR, EST AFRICAN AMERICAN: 46 mL/min — AB (ref >60)
Glucose, Bld: 62 mg/dL — ABNORMAL LOW (ref 70–99)
Potassium: 3.9 mmol/L (ref 3.5–5.1)
Sodium: 139 mmol/L (ref 135–145)
Total Bilirubin: 0.4 mg/dL (ref 0.3–1.2)
Total Protein: 6.8 g/dL (ref 6.5–8.1)

## 2018-03-31 LAB — URINALYSIS, ROUTINE W REFLEX MICROSCOPIC
Bacteria, UA: NONE SEEN
Bilirubin Urine: NEGATIVE
GLUCOSE, UA: 50 mg/dL — AB
Hgb urine dipstick: NEGATIVE
Ketones, ur: NEGATIVE mg/dL
Leukocytes, UA: NEGATIVE
Nitrite: NEGATIVE
Protein, ur: 30 mg/dL — AB
Specific Gravity, Urine: 1.01 (ref 1.005–1.030)
pH: 5 (ref 5.0–8.0)

## 2018-03-31 LAB — LIPASE, BLOOD: Lipase: 19 U/L (ref 11–51)

## 2018-03-31 LAB — CBG MONITORING, ED: Glucose-Capillary: 145 mg/dL — ABNORMAL HIGH (ref 70–99)

## 2018-03-31 MED ORDER — SODIUM CHLORIDE 0.9% FLUSH: 3.0000 mL | Freq: Once | INTRAVENOUS | Status: DC

## 2018-03-31 MED ORDER — GLUCOSE 40 % PO GEL
1.0000 | Freq: Once | ORAL | Status: DC
  Filled 2018-03-31: qty 1

## 2018-03-31 MED ORDER — SODIUM CHLORIDE 0.9 % IV BOLUS
1000.0000 mL | Freq: Once | INTRAVENOUS | Status: AC
  Administered 2018-03-31: 1000 mL via INTRAVENOUS

## 2018-03-31 MED ORDER — ONDANSETRON HCL 4 MG/2ML IJ SOLN
4.0000 mg | Freq: Once | INTRAMUSCULAR | Status: AC
  Administered 2018-03-31: 4 mg via INTRAVENOUS
  Filled 2018-03-31: qty 2

## 2018-03-31 MED ORDER — IOHEXOL 300 MG/ML  SOLN
75.0000 mL | Freq: Once | INTRAMUSCULAR | Status: AC | PRN
  Administered 2018-03-31: 75 mL via INTRAVENOUS

## 2018-03-31 MED ORDER — FENTANYL CITRATE (PF) 100 MCG/2ML IJ SOLN
50.0000 ug | Freq: Once | INTRAMUSCULAR | Status: AC
  Administered 2018-03-31: 50 ug via INTRAVENOUS
  Filled 2018-03-31: qty 2

## 2018-03-31 NOTE — Telephone Encounter (Signed)
Spoke with pt and let her know she should continue taking the carafate. She will keep her OV as scheduled.

## 2018-03-31 NOTE — Telephone Encounter (Signed)
PT was at the ER on 02-03 and was given sucralfate (CARAFATE) and is wondering if she should continue to use it she is feeling a little better but still having symptoms.

## 2018-03-31 NOTE — ED Triage Notes (Signed)
Pt was seen on Monday for abd pain, comes back today for worsening abd pain. Was started on carafate for ulcerse without any improvement. Denies NVD, reports constipation

## 2018-03-31 NOTE — ED Provider Notes (Signed)
[facility] EMERGENCY DEPARTMENT Provider Note   CSN: [id number] Arrival date & time: 03/31/18  2112     History   Chief Complaint Chief Complaint  Patient presents with  . Abdominal Pain    HPI [name] is a 68 y.o. female.  Patient with history of chronic pancreatitis presents to the emergency department with a chief complaint of epigastric abdominal pain.  She states that it started about a week ago and has been fairly constant, but worsened today.  She complains that the pain radiates to her back.  She states that it feels worse when she eats.  She states she has had gallstones in the past, chart review reveals prior cholecystectomy.  She has tried taking antacids and Carafate without any improvement of her symptoms.  She states that she has felt nauseated, and has been chewing gum to prevent this.  He denies any diarrhea.  Denies any lower abdominal pain.  Denies any dysuria. She states she doesn't drink anymore.  The history is provided by the patient. No language interpreter was used.    Past Medical History:  Diagnosis Date  . ANEMIA, CHRONIC DISEASE NEC 03/15/2006  . Carotid artery occlusion   . Cirrhosis ([place])   . Colon polyps   . Constipation due to opioid therapy 06/05/2014  . DEGENERATIVE JOINT DISEASE 05/06/2007   On going for >5 yrs No imaging to confirm Has tried ultram, mobic, neurontin which did not work Ambulance person tried once worked well    . Diabetes mellitus    Type II  . DIABETIC  RETINOPATHY 03/15/2006  . 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 Diastolic dysfunction 94/17/4081   Grade 1 by Echocardiogram 12/13/14  . Elevated liver enzymes   . Heart murmur   . Hemorrhoids   . History of kidney stones   . Hyperlipidemia 04/01/2011  . Hypertension   . Mild AI (aortic insufficiency)   . Moderate aortic regurgitation 09/25/2008   12/13/14 Echocardiogram    . Myalgia   . PAD (peripheral artery disease) ([place])   . PANCREATITIS, CHRONIC  03/29/2006  . Peripheral arterial disease ([place])   . Pneumonia    age 3-20  . RENAL INSUFFICIENCY, CHRONIC 03/15/2006  . Tonsillar mass    Left    Patient Active Problem List   Diagnosis Date Noted  . Recurrent epistaxis 05/31/2017  . Throat pain in adult 05/31/2017  . Tubular adenoma of colon 06/30/2016  . Diverticulosis of colon without hemorrhage 06/30/2016  . Postmenopausal vaginal bleeding   . Long term (current) use of opiate analgesic 04/09/2016  . Bilateral carotid artery disease ([place]) 11/28/2015  . Pancreatic insufficiency 05/09/2015  . 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 Risk for coronary artery disease greater than 20% in next 10 years 03/14/2015  . Diastolic dysfunction 44/81/8563  . Vulvar cyst 01/23/2015  . Abnormal uterine bleeding 12/02/2014  . Unexplained night sweats 12/02/2014  . Constipation due to opioid therapy 06/05/2014  . Loss of weight 03/29/2014  . Peripheral arterial disease ([place]) 12/12/2013  . GERD (gastroesophageal reflux disease) 10/12/2012  . Healthcare maintenance 09/29/2012  . Back pain 03/28/2012  . Hyperlipidemia 04/01/2011  . BOILS, RECURRENT 03/13/2010  . Moderate aortic regurgitation 09/25/2008  . HEMORRHOIDS 06/08/2008  . Osteoarthritis 05/06/2007  . TOBACCO ABUSE 09/27/2006  . History of osteoporosis 06/29/2006  . Chronic pancreatitis ([place]) 03/29/2006  . Uncontrolled type 2 diabetes mellitus with chronic kidney disease, without long-term current use of insulin ([place]) 03/15/2006  .  Diabetic neuropathy, painful ([place]) 03/15/2006  . Anemia of chronic disease 03/15/2006  . Essential hypertension 03/15/2006  . VENTRICULAR HYPERTROPHY, LEFT 03/15/2006  . CKD stage 3 due to type 2 diabetes mellitus ([place]) 03/15/2006    Past Surgical History:  Procedure Laterality Date  . BREAST SURGERY    . COLONOSCOPY    . DILATION AND CURETTAGE OF UTERUS N/A 06/16/2016   Procedure: DILATATION AND CURETTAGE;  Surgeon: Mora Bellman, MD;   Location: [facility];  Service: Gynecology;  Laterality: N/A;  . HYSTEROSCOPY N/A 06/16/2016   Procedure: HYSTEROSCOPY;  Surgeon: Mora Bellman, MD;  Location: [facility];  Service: Gynecology;  Laterality: N/A;  . MOUTH SURGERY N/A   . POLYPECTOMY N/A 06/16/2016   Procedure: POLYPECTOMY;  Surgeon: Mora Bellman, MD;  Location: [facility];  Service: Gynecology;  Laterality: N/A;     OB History    Gravida  3   Para  3   Term  3   Preterm      AB      Living  3     SAB      TAB      Ectopic      Multiple      Live Births  3            Home Medications    Prior to Admission medications   Medication Sig Start Date End Date Taking? Authorizing Provider  ACCU-CHEK AVIVA PLUS test strip TEST three times a day 11/25/15   Lucious Groves, DO  amLODipine (NORVASC) 10 MG tablet Take 10 mg by mouth daily. 06/01/17   [provider]  aspirin EC 81 MG tablet Take 1 tablet (81 mg total) by mouth daily. 04/09/16   Lucious Groves, DO  Blood Glucose Monitoring Suppl (ACCU-CHEK AVIVA PLUS) w/Device KIT Use to check you blood sugar 3 times a day 06/04/15   Oval Linsey, MD  cyclobenzaprine (FLEXERIL) 10 MG tablet Take 1 tablet (10 mg total) by mouth 3 (three) times daily as needed for muscle spasms. 02/27/18   Virgel Manifold, MD  diclofenac sodium (VOLTAREN) 1 % GEL Apply 4 g topically 3 (three) times daily as needed. 03/13/18   Duffy Bruce, MD  HYDROcodone-acetaminophen (NORCO/VICODIN) 5-325 MG tablet Take 1-2 tablets by mouth every 6 (six) hours as needed for moderate pain or severe pain. 03/13/18   Duffy Bruce, MD  LANTUS SOLOSTAR 100 UNIT/ML Solostar Pen Inject 12-14 Units into the skin daily after breakfast.  05/04/17   [provider]  LINZESS 145 MCG CAPS capsule Take 145 mcg by mouth daily.  03/07/17   [provider]  meclizine (ANTIVERT) 25 MG tablet Take 1 tablet (25 mg total) by mouth 3 (three) times daily as needed for dizziness. 5/36/46   Delora Fuel, MD    Multiple Vitamins-Minerals (ONE-A-DAY WOMENS 50+ ADVANTAGE) TABS Take 1 tablet by mouth daily with breakfast.    [provider]  NONFORMULARY OR COMPOUNDED ITEM Apply 1-2 g topically 4 (four) times daily. [facility]  Peripheral Neuropathy Cream- Bupivacaine 1%, Doxepin 3%, Gabapentin 6%, Pentoxifylline 3%, Topiramate 1% Apply 1-2 grams to affected area 3-4 times daily Qty. 120 gm 3 refills 12/03/17   Evelina Bucy, DPM  Pancrelipase, Lip-Prot-Amyl, 24000-76000 units CPEP Take 4 capsules (96,000 Units total) by mouth See admin instructions. Take 4 capsules by mouth 2-3 times daily before meals Patient taking differently: Take 2-4 capsules by mouth See admin instructions. Take 4 capsules by mouth two to three times  a day with meals and 2 capsules with any snacks 03/10/16   Lucious Groves, DO  PROAIR HFA 108 [phone] Base) MCG/ACT inhaler Inhale 2 puffs into the lungs 4 (four) times daily as needed for wheezing or shortness of breath. 04/30/17   [provider]  ranitidine (ZANTAC) 150 MG tablet Take 1 tablet (150 mg total) by mouth daily. 01/18/18   Irene Shipper, MD  rosuvastatin (CRESTOR) 20 MG tablet Take 20 mg by mouth daily. 10/03/17   [provider]  silver sulfADIAZINE (SILVADENE) 1 % cream Apply 1 application topically daily. Apply topically daily to foot. Patient taking differently: Apply 1 application topically See admin instructions. Apply to bottom of right foot as needed for irritation 08/19/17   Price, Christian Mate, DPM  sitaGLIPtin (JANUVIA) 50 MG tablet Take 1 tablet (50 mg total) by mouth daily. 09/10/16   Lucious Groves, DO  sucralfate (CARAFATE) 1 g tablet Take 1 tablet (1 g total) by mouth 4 (four) times daily -  with meals and at bedtime. 03/28/18   Malvin Johns, MD  traMADol (ULTRAM) 50 MG tablet Take 1 tablet (50 mg total) by mouth every 6 (six) hours as needed. 08/30/17   Daleen Bo, MD    Family History Family History  Problem Relation Age of  Onset  . Dementia Mother   . Colon cancer Neg Hx     Social History Social History   Tobacco Use  . Smoking status: Current Every Day Smoker    Packs/day: 0.30    Years: 44.00    Pack years: 13.20    Types: Cigarettes    Last attempt to quit: 01/2017    Years since quitting: 1.1  . Smokeless tobacco: Never Used  . Tobacco comment: started smoking in Nov 2019 few cigarettes a day  Substance Use Topics  . Alcohol use: Yes    Alcohol/week: 0.0 standard drinks    Comment: occ  . Drug use: No     Allergies   Atorvastatin; Gabapentin; and Nicorette [nicotine]   Review of Systems Review of Systems  All other systems reviewed and are negative.    Physical Exam Updated Vital Signs BP 140/62 (BP Location: Left Arm)   Pulse 85   Temp 98.6 F (37 C) (Oral)   Resp 14   LMP 02/22/2016   SpO2 98%   Physical Exam Vitals signs and nursing note reviewed.  Constitutional:      Appearance: She is well-developed.  HENT:     Head: Normocephalic and atraumatic.  Eyes:     Conjunctiva/sclera: Conjunctivae normal.     Pupils: Pupils are equal, round, and reactive to light.  Neck:     Musculoskeletal: Normal range of motion and neck supple.  Cardiovascular:     Rate and Rhythm: Normal rate and regular rhythm.     Heart sounds: No murmur. No friction rub. No gallop.   Pulmonary:     Effort: Pulmonary effort is normal. No respiratory distress.     Breath sounds: Normal breath sounds. No wheezing or rales.  Chest:     Chest wall: No tenderness.  Abdominal:     General: Bowel sounds are normal. There is no distension.     Palpations: Abdomen is soft. There is no mass.     Tenderness: There is abdominal tenderness in the epigastric area. There is no guarding or rebound.  Musculoskeletal: Normal range of motion.        General: No tenderness.  Skin:  General: Skin is warm and dry.  Neurological:     Mental Status: She is alert and oriented to person, place, and time.    Psychiatric:        Behavior: Behavior normal.        Thought Content: Thought content normal.        Judgment: Judgment normal.      ED Treatments / Results  Labs (all labs ordered are listed, but only abnormal results are displayed) Labs Reviewed  COMPREHENSIVE METABOLIC PANEL - Abnormal; Notable for the following components:      Result Value   CO2 21 (*)    Glucose, Bld 62 (*)    BUN 25 (*)    Creatinine, Ser 1.38 (*)    Calcium 8.8 (*)    Albumin 2.8 (*)    ALT 45 (*)    GFR calc non Af Amer 40 (*)    GFR calc Af Amer 46 (*)    All other components within normal limits  CBC - Abnormal; Notable for the following components:   RBC 3.61 (*)    Hemoglobin 9.7 (*)    HCT 31.3 (*)    All other components within normal limits  URINALYSIS, ROUTINE W REFLEX MICROSCOPIC - Abnormal; Notable for the following components:   Glucose, UA 50 (*)    Protein, ur 30 (*)    All other components within normal limits  CBG MONITORING, ED - Abnormal; Notable for the following components:   Glucose-Capillary 145 (*)    All other components within normal limits  LIPASE, BLOOD    EKG None  Radiology Ct Abdomen Pelvis W Contrast  Result Date: 03/31/2018 CLINICAL DATA:  Patient seen on Monday for abdominal pain. Returns today for worsening abdominal pain. Treatment for ulcers without improvement. Constipation. EXAM: CT ABDOMEN AND PELVIS WITH CONTRAST TECHNIQUE: Multidetector CT imaging of the abdomen and pelvis was performed using the standard protocol following bolus administration of intravenous contrast. CONTRAST:  9m OMNIPAQUE IOHEXOL 300 MG/ML  SOLN COMPARISON:  MRI abdomen 05/11/2017. CT liver 04/26/2017 FINDINGS: Lower chest: Mild dependent changes in the lung bases. Hepatobiliary: Liver parenchymal pattern is somewhat coarsened without focal lesion identified. This may indicate cirrhosis. Gallbladder is surgically absent. Bile ducts are dilated, probably normal for postoperative  physiology. Pancreas: Scattered calcifications throughout the pancreas consistent with changes due to chronic pancreatitis. Heterogeneous appearance of the head of the pancreas with indistinct margins and surrounding edema. These changes have developed since the previous study. This likely represents focal pancreatitis. However, follow-up after resolution of acute process is recommended to exclude underlying pancreatic head mass. No mass was suggested on the previous study. Loss of distinction and hypoenhancement of the parenchyma in the head of the pancreas may indicate early pancreatic necrosis. No loculated collections. Spleen: Normal in size without focal abnormality. Adrenals/Urinary Tract: No adrenal gland nodules. Subcentimeter cysts in the renal parenchyma bilaterally. Nephrograms are otherwise homogeneous and symmetrical. No hydronephrosis or hydroureter. Bladder is unremarkable. Stomach/Bowel: Stomach, small bowel, and colon are not abnormally distended. The colon is diffusely filled with stool suggesting constipation. No wall thickening or inflammatory changes identified in the colon. Appendix is not identified. Vascular/Lymphatic: Aortic atherosclerosis. No enlarged abdominal or pelvic lymph nodes. Reproductive: Uterus is displaced towards the right. No pelvic or adnexal mass. Other: No free air or free fluid in the abdomen. Small right inguinal hernia containing fat. Musculoskeletal: Degenerative changes in the lumbar spine. No destructive bone lesions. IMPRESSION: 1. Enlarged and heterogeneous appearance of  the pancreatic head with surrounding edema likely representing focal pancreatitis. Suggestion of early pancreatic necrosis. No loculated fluid collections. Follow-up after resolution of acute process is recommended to exclude underlying pancreatic head mass. 2. Underlying changes of chronic pancreatitis. 3. Probable hepatic cirrhosis. 4. Stool-filled colon suggesting constipation. 5. Small right  inguinal hernia containing fat. Aortic Atherosclerosis (ICD10-I70.0). Electronically Signed   By: Lucienne Capers M.D.   On: 03/31/2018 23:56    Procedures Procedures (including critical care time)  Medications Ordered in ED Medications  sodium chloride flush (NS) 0.9 % injection 3 mL (has no administration in time range)  fentaNYL (SUBLIMAZE) injection 50 mcg (has no administration in time range)  ondansetron (ZOFRAN) injection 4 mg (has no administration in time range)     Initial Impression / Assessment and Plan / ED Course  I have reviewed the triage vital signs and the nursing notes.  Pertinent labs & imaging results that were available during my care of the patient were reviewed by me and considered in my medical decision making (see chart for details).     Patient with approximately 1 week of epigastric abdominal pain.  Pain radiates to her back.  History of cholecystectomy.  States this feels like her pancreas.  Patient seen by discussed with Dr. Gilford Raid, who agrees with plan for CT imaging to evaluate for pancreatitis.  CT shows inflammation of the pancreas consistent with acute pancreatitis and possibly early necrosis.  Patient still uncomfortable.  Given fluids and fentanyl.  Will consult medicine for admission.  Appreciate Dr. Alcario Drought for admitting.  Final Clinical Impressions(s) / ED Diagnoses   Final diagnoses:  Acute pancreatitis, unspecified complication status, unspecified pancreatitis type    ED Discharge Orders    None       Montine Circle, PA-C 04/01/18 0041    Isla Pence, MD 04/02/18 1139

## 2018-04-01 ENCOUNTER — Other Ambulatory Visit: Payer: Self-pay

## 2018-04-01 DIAGNOSIS — N183 Chronic kidney disease, stage 3 (moderate): Secondary | ICD-10-CM | POA: Diagnosis present

## 2018-04-01 DIAGNOSIS — Z794 Long term (current) use of insulin: Secondary | ICD-10-CM | POA: Diagnosis not present

## 2018-04-01 DIAGNOSIS — I10 Essential (primary) hypertension: Secondary | ICD-10-CM

## 2018-04-01 DIAGNOSIS — K5900 Constipation, unspecified: Secondary | ICD-10-CM | POA: Diagnosis present

## 2018-04-01 DIAGNOSIS — K861 Other chronic pancreatitis: Secondary | ICD-10-CM

## 2018-04-01 DIAGNOSIS — K8689 Other specified diseases of pancreas: Secondary | ICD-10-CM | POA: Diagnosis present

## 2018-04-01 DIAGNOSIS — E1151 Type 2 diabetes mellitus with diabetic peripheral angiopathy without gangrene: Secondary | ICD-10-CM | POA: Diagnosis present

## 2018-04-01 DIAGNOSIS — E1165 Type 2 diabetes mellitus with hyperglycemia: Secondary | ICD-10-CM

## 2018-04-01 DIAGNOSIS — E1142 Type 2 diabetes mellitus with diabetic polyneuropathy: Secondary | ICD-10-CM | POA: Diagnosis present

## 2018-04-01 DIAGNOSIS — F17210 Nicotine dependence, cigarettes, uncomplicated: Secondary | ICD-10-CM | POA: Diagnosis present

## 2018-04-01 DIAGNOSIS — Z7982 Long term (current) use of aspirin: Secondary | ICD-10-CM | POA: Diagnosis not present

## 2018-04-01 DIAGNOSIS — K859 Acute pancreatitis without necrosis or infection, unspecified: Secondary | ICD-10-CM | POA: Diagnosis present

## 2018-04-01 DIAGNOSIS — E1122 Type 2 diabetes mellitus with diabetic chronic kidney disease: Secondary | ICD-10-CM | POA: Diagnosis present

## 2018-04-01 DIAGNOSIS — I129 Hypertensive chronic kidney disease with stage 1 through stage 4 chronic kidney disease, or unspecified chronic kidney disease: Secondary | ICD-10-CM | POA: Diagnosis present

## 2018-04-01 DIAGNOSIS — K8591 Acute pancreatitis with uninfected necrosis, unspecified: Secondary | ICD-10-CM | POA: Diagnosis present

## 2018-04-01 LAB — GLUCOSE, CAPILLARY
GLUCOSE-CAPILLARY: 77 mg/dL (ref 70–99)
Glucose-Capillary: 107 mg/dL — ABNORMAL HIGH (ref 70–99)
Glucose-Capillary: 115 mg/dL — ABNORMAL HIGH (ref 70–99)
Glucose-Capillary: 226 mg/dL — ABNORMAL HIGH (ref 70–99)
Glucose-Capillary: 28 mg/dL — CL (ref 70–99)
Glucose-Capillary: 28 mg/dL — CL (ref 70–99)
Glucose-Capillary: 57 mg/dL — ABNORMAL LOW (ref 70–99)
Glucose-Capillary: 60 mg/dL — ABNORMAL LOW (ref 70–99)
Glucose-Capillary: 63 mg/dL — ABNORMAL LOW (ref 70–99)
Glucose-Capillary: 70 mg/dL (ref 70–99)
Glucose-Capillary: 72 mg/dL (ref 70–99)
Glucose-Capillary: 85 mg/dL (ref 70–99)

## 2018-04-01 LAB — COMPREHENSIVE METABOLIC PANEL WITH GFR
ALT: 43 U/L (ref 0–44)
AST: 51 U/L — ABNORMAL HIGH (ref 15–41)
Albumin: 2.5 g/dL — ABNORMAL LOW (ref 3.5–5.0)
Alkaline Phosphatase: 98 U/L (ref 38–126)
Anion gap: 7 (ref 5–15)
BUN: 20 mg/dL (ref 8–23)
CO2: 21 mmol/L — ABNORMAL LOW (ref 22–32)
Calcium: 8.2 mg/dL — ABNORMAL LOW (ref 8.9–10.3)
Chloride: 111 mmol/L (ref 98–111)
Creatinine, Ser: 1.2 mg/dL — ABNORMAL HIGH (ref 0.44–1.00)
GFR calc Af Amer: 55 mL/min — ABNORMAL LOW
GFR calc non Af Amer: 47 mL/min — ABNORMAL LOW
Glucose, Bld: 114 mg/dL — ABNORMAL HIGH (ref 70–99)
Potassium: 3.6 mmol/L (ref 3.5–5.1)
Sodium: 139 mmol/L (ref 135–145)
Total Bilirubin: 0.3 mg/dL (ref 0.3–1.2)
Total Protein: 6.6 g/dL (ref 6.5–8.1)

## 2018-04-01 LAB — CBC
HCT: 30.4 % — ABNORMAL LOW (ref 36.0–46.0)
Hemoglobin: 9.5 g/dL — ABNORMAL LOW (ref 12.0–15.0)
MCH: 26.8 pg (ref 26.0–34.0)
MCHC: 31.3 g/dL (ref 30.0–36.0)
MCV: 85.9 fL (ref 80.0–100.0)
Platelets: 132 K/uL — ABNORMAL LOW (ref 150–400)
RBC: 3.54 MIL/uL — ABNORMAL LOW (ref 3.87–5.11)
RDW: 14.7 % (ref 11.5–15.5)
WBC: 3.2 K/uL — ABNORMAL LOW (ref 4.0–10.5)
nRBC: 0 % (ref 0.0–0.2)

## 2018-04-01 LAB — HIV ANTIBODY (ROUTINE TESTING W REFLEX): HIV Screen 4th Generation wRfx: NONREACTIVE

## 2018-04-01 MED ORDER — ROSUVASTATIN CALCIUM 20 MG PO TABS
20.0000 mg | ORAL_TABLET | Freq: Every day | ORAL | Status: DC
  Administered 2018-04-01 – 2018-04-02 (×2): 20 mg via ORAL
  Filled 2018-04-01 (×2): qty 1

## 2018-04-01 MED ORDER — DEXTROSE 50 % IV SOLN
INTRAVENOUS | Status: AC
  Administered 2018-04-01: 23 mL
  Administered 2018-04-01: 25 mL
  Filled 2018-04-01: qty 50

## 2018-04-01 MED ORDER — DOCUSATE SODIUM 100 MG PO CAPS: 100.0000 mg | ORAL_CAPSULE | Freq: Two times a day (BID) | ORAL | Status: DC

## 2018-04-01 MED ORDER — DEXTROSE 50 % IV SOLN
12.5000 g | INTRAVENOUS | Status: AC
  Administered 2018-04-01: 12.5 g via INTRAVENOUS

## 2018-04-01 MED ORDER — LINACLOTIDE 145 MCG PO CAPS
145.0000 ug | ORAL_CAPSULE | Freq: Every day | ORAL | Status: DC
  Administered 2018-04-01 – 2018-04-02 (×2): 145 ug via ORAL
  Filled 2018-04-01 (×2): qty 1

## 2018-04-01 MED ORDER — ONDANSETRON HCL 4 MG/2ML IJ SOLN
4.0000 mg | Freq: Four times a day (QID) | INTRAMUSCULAR | Status: DC | PRN
  Administered 2018-04-01: 4 mg via INTRAVENOUS
  Filled 2018-04-01: qty 2

## 2018-04-01 MED ORDER — ONDANSETRON HCL 4 MG PO TABS: 4.0000 mg | ORAL_TABLET | Freq: Four times a day (QID) | ORAL | Status: DC | PRN

## 2018-04-01 MED ORDER — ASPIRIN EC 81 MG PO TBEC
81.0000 mg | DELAYED_RELEASE_TABLET | Freq: Every day | ORAL | Status: DC
  Administered 2018-04-01 – 2018-04-02 (×2): 81 mg via ORAL
  Filled 2018-04-01 (×2): qty 1

## 2018-04-01 MED ORDER — ADULT MULTIVITAMIN W/MINERALS CH
1.0000 | ORAL_TABLET | Freq: Every day | ORAL | Status: DC
  Administered 2018-04-01 – 2018-04-02 (×2): 1 via ORAL
  Filled 2018-04-01 (×2): qty 1

## 2018-04-01 MED ORDER — SUCRALFATE 1 G PO TABS
1.0000 g | ORAL_TABLET | Freq: Three times a day (TID) | ORAL | Status: DC
  Administered 2018-04-02 (×2): 1 g via ORAL
  Filled 2018-04-01 (×4): qty 1

## 2018-04-01 MED ORDER — BISACODYL 5 MG PO TBEC
5.0000 mg | DELAYED_RELEASE_TABLET | Freq: Every day | ORAL | Status: DC | PRN
  Administered 2018-04-01: 5 mg via ORAL
  Filled 2018-04-01: qty 1

## 2018-04-01 MED ORDER — MORPHINE SULFATE (PF) 2 MG/ML IV SOLN
2.0000 mg | INTRAVENOUS | Status: DC | PRN
  Administered 2018-04-01: 2 mg via INTRAVENOUS
  Filled 2018-04-01: qty 1

## 2018-04-01 MED ORDER — ALBUTEROL SULFATE (2.5 MG/3ML) 0.083% IN NEBU: 3.0000 mL | INHALATION_SOLUTION | Freq: Four times a day (QID) | RESPIRATORY_TRACT | Status: DC | PRN

## 2018-04-01 MED ORDER — INSULIN ASPART 100 UNIT/ML ~~LOC~~ SOLN
0.0000 [IU] | SUBCUTANEOUS | Status: DC
  Administered 2018-04-01: 3 [IU] via SUBCUTANEOUS
  Administered 2018-04-02: 5 [IU] via SUBCUTANEOUS

## 2018-04-01 MED ORDER — SODIUM CHLORIDE 0.9 % IV SOLN
INTRAVENOUS | Status: DC
  Administered 2018-04-01 – 2018-04-02 (×2): via INTRAVENOUS

## 2018-04-01 MED ORDER — DEXTROSE 50 % IV SOLN
INTRAVENOUS | Status: AC
  Filled 2018-04-01: qty 50

## 2018-04-01 MED ORDER — ACETAMINOPHEN 650 MG RE SUPP: 650.0000 mg | Freq: Four times a day (QID) | RECTAL | Status: DC | PRN

## 2018-04-01 MED ORDER — CYCLOBENZAPRINE HCL 10 MG PO TABS: 10.0000 mg | ORAL_TABLET | Freq: Three times a day (TID) | ORAL | Status: DC | PRN

## 2018-04-01 MED ORDER — DICLOFENAC SODIUM 1 % TD GEL: 4.0000 g | Freq: Three times a day (TID) | TRANSDERMAL | Status: DC | PRN

## 2018-04-01 MED ORDER — DEXTROSE 10 % IV SOLN
INTRAVENOUS | Status: DC
  Administered 2018-04-01 (×2): via INTRAVENOUS

## 2018-04-01 MED ORDER — FAMOTIDINE 20 MG PO TABS
20.0000 mg | ORAL_TABLET | Freq: Every day | ORAL | Status: DC
  Administered 2018-04-01 – 2018-04-02 (×2): 20 mg via ORAL
  Filled 2018-04-01 (×2): qty 1

## 2018-04-01 MED ORDER — ACETAMINOPHEN 325 MG PO TABS: 650.0000 mg | ORAL_TABLET | Freq: Four times a day (QID) | ORAL | Status: DC | PRN

## 2018-04-01 MED ORDER — AMLODIPINE BESYLATE 5 MG PO TABS: 10.0000 mg | ORAL_TABLET | Freq: Every day | ORAL | Status: DC

## 2018-04-01 MED ORDER — FENTANYL CITRATE (PF) 100 MCG/2ML IJ SOLN
50.0000 ug | Freq: Once | INTRAMUSCULAR | Status: AC
  Administered 2018-04-01: 50 ug via INTRAVENOUS
  Filled 2018-04-01: qty 2

## 2018-04-01 MED ORDER — ENOXAPARIN SODIUM 40 MG/0.4ML ~~LOC~~ SOLN
40.0000 mg | SUBCUTANEOUS | Status: DC
  Administered 2018-04-01 – 2018-04-02 (×2): 40 mg via SUBCUTANEOUS
  Filled 2018-04-01 (×2): qty 0.4

## 2018-04-01 NOTE — Plan of Care (Signed)
  Problem: Clinical Measurements: Goal: Will remain free from infection Outcome: Progressing Goal: Cardiovascular complication will be avoided Outcome: Progressing   Problem: Coping: Goal: Level of anxiety will decrease Outcome: Progressing   Problem: Pain Managment: Goal: General experience of comfort will improve Outcome: Progressing   Problem: Skin Integrity: Goal: Risk for impaired skin integrity will decrease Outcome: Progressing   

## 2018-04-01 NOTE — Progress Notes (Addendum)
Hypoglycemic Event  CBG: 57  Treatment: 12.5g dextrose 50%  Symptoms: none  Follow-up CBG: Time: 0948 CBG Result:85  Possible Reasons for Event: Pt NPO  Comments/MD notified: MD notified    Debbora Dus

## 2018-04-01 NOTE — Progress Notes (Signed)
Blood glucose 72 IVF was off for two hours while awaiting iv restart. Pt was given 13ml D50.

## 2018-04-01 NOTE — Plan of Care (Signed)
  Problem: Clinical Measurements: Goal: Ability to maintain clinical measurements within normal limits will improve Outcome: Progressing   Problem: Coping: Goal: Level of anxiety will decrease Outcome: Progressing   Problem: Pain Managment: Goal: General experience of comfort will improve Outcome: Progressing   

## 2018-04-01 NOTE — Progress Notes (Signed)
Hypoglycemic Event  CBG: 63  Treatment: 12.5g dextrose 50%  Symptoms: None  Follow-up CBG: Time: 1732 CBG Result: 107  Possible Reasons for Event: pt NPO  Comments/MD notified: MD notified    [name]

## 2018-04-01 NOTE — H&P (Signed)
History and Physical    [name] VEH:[id number] DOB: [birth year] DOA: 03/31/2018  PCP: Nolene Ebbs, MD  Patient coming from: Home  I have personally briefly reviewed patient's old medical records in [place]  Chief Complaint: Abd pain  HPI: [name] is a 68 y.o. female with medical history significant of DM2, HTN, chronic pancreatitis though pancreatitis hasnt bothered her "in a long time" she says.  Presented to the ED on Monday for abd pain.  Comes back today with worsening pain.  Symptoms initially started about 1 week ago, fairly constant, radiates to back.  Worse when she eats.  Antacids and carafate provided no improvement.  Associated nausea.  No diarrhea.   ED Course: Lipase is normal, but CT reveals acute pancreatitis with early necrosis of head of pancreas.  Also constipation.   Review of Systems: As per HPI otherwise 10 point review of systems negative.   Past Medical History:  Diagnosis Date  . ANEMIA, CHRONIC DISEASE NEC 03/15/2006  . Carotid artery occlusion   . Cirrhosis ([place])   . Colon polyps   . Constipation due to opioid therapy 06/05/2014  . DEGENERATIVE JOINT DISEASE 05/06/2007   On going for >5 yrs No imaging to confirm Has tried ultram, mobic, neurontin which did not work Ambulance person tried once worked well    . Diabetes mellitus    Type II  . DIABETIC  RETINOPATHY 03/15/2006  . 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 Diastolic dysfunction 83/66/2947   Grade 1 by Echocardiogram 12/13/14  . Elevated liver enzymes   . Heart murmur   . Hemorrhoids   . History of kidney stones   . Hyperlipidemia 04/01/2011  . Hypertension   . Mild AI (aortic insufficiency)   . Moderate aortic regurgitation 09/25/2008   12/13/14 Echocardiogram    . Myalgia   . PAD (peripheral artery disease) ([place])   . PANCREATITIS, CHRONIC 03/29/2006  . Peripheral arterial disease ([place])   . Pneumonia    age 69-20  . RENAL INSUFFICIENCY, CHRONIC 03/15/2006  . Tonsillar  mass    Left    Past Surgical History:  Procedure Laterality Date  . BREAST SURGERY    . COLONOSCOPY    . DILATION AND CURETTAGE OF UTERUS N/A 06/16/2016   Procedure: DILATATION AND CURETTAGE;  Surgeon: Mora Bellman, MD;  Location: [facility];  Service: Gynecology;  Laterality: N/A;  . HYSTEROSCOPY N/A 06/16/2016   Procedure: HYSTEROSCOPY;  Surgeon: Mora Bellman, MD;  Location: [facility];  Service: Gynecology;  Laterality: N/A;  . MOUTH SURGERY N/A   . POLYPECTOMY N/A 06/16/2016   Procedure: POLYPECTOMY;  Surgeon: Mora Bellman, MD;  Location: [facility];  Service: Gynecology;  Laterality: N/A;     reports that she has been smoking cigarettes. She has a 13.20 pack-year smoking history. She has never used smokeless tobacco. She reports current alcohol use. She reports that she does not use drugs.  Allergies  Allergen Reactions  . Atorvastatin Other (See Comments)    Caused muscles in her back to be very sore  . Gabapentin Swelling    Legs and feet swell  . Nicorette [Nicotine] Nausea Only    Family History  Problem Relation Age of Onset  . Dementia Mother   . Colon cancer Neg Hx      Prior to Admission medications   Medication Sig Start Date End Date Taking? Authorizing Provider  amLODipine (NORVASC) 10 MG tablet Take 10 mg by mouth daily. 06/01/17  Yes [provider]  aspirin EC 81 MG tablet Take 1 tablet (81 mg total) by mouth daily. 04/09/16  Yes Lucious Groves, DO  cyclobenzaprine (FLEXERIL) 10 MG tablet Take 1 tablet (10 mg total) by mouth 3 (three) times daily as needed for muscle spasms. 02/27/18  Yes Virgel Manifold, MD  diclofenac sodium (VOLTAREN) 1 % GEL Apply 4 g topically 3 (three) times daily as needed. Patient taking differently: Apply 4 g topically 3 (three) times daily as needed (for pain).  03/13/18  Yes Duffy Bruce, MD  LANTUS SOLOSTAR 100 UNIT/ML Solostar Pen Inject 20 Units into the skin daily after breakfast.  05/04/17  Yes [provider]    LINZESS 145 MCG CAPS capsule Take 145 mcg by mouth daily.  03/07/17  Yes [provider]  Multiple Vitamins-Minerals (ONE-A-DAY WOMENS 50+ ADVANTAGE) TABS Take 1 tablet by mouth daily with breakfast.   Yes [provider]  NONFORMULARY OR COMPOUNDED ITEM Apply 1-2 g topically 4 (four) times daily. [facility]  Peripheral Neuropathy Cream- Bupivacaine 1%, Doxepin 3%, Gabapentin 6%, Pentoxifylline 3%, Topiramate 1% Apply 1-2 grams to affected area 3-4 times daily Qty. 120 gm 3 refills 12/03/17  Yes Price, Christian Mate, DPM  Pancrelipase, Lip-Prot-Amyl, 24000-76000 units CPEP Take 4 capsules (96,000 Units total) by mouth See admin instructions. Take 4 capsules by mouth 2-3 times daily before meals Patient taking differently: Take 2-4 capsules by mouth See admin instructions. Take 4 capsules by mouth two to three times a day with meals and 2 capsules with any snacks 03/10/16  Yes Lucious Groves, DO  PROAIR HFA 108 (90 Base) MCG/ACT inhaler Inhale 2 puffs into the lungs 4 (four) times daily as needed for wheezing or shortness of breath. 04/30/17  Yes [provider]  ranitidine (ZANTAC) 150 MG tablet Take 1 tablet (150 mg total) by mouth daily. 01/18/18  Yes Irene Shipper, MD  rosuvastatin (CRESTOR) 20 MG tablet Take 20 mg by mouth daily. 10/03/17  Yes [provider]  sitaGLIPtin (JANUVIA) 50 MG tablet Take 1 tablet (50 mg total) by mouth daily. 09/10/16  Yes Lucious Groves, DO  sucralfate (CARAFATE) 1 g tablet Take 1 tablet (1 g total) by mouth 4 (four) times daily -  with meals and at bedtime. 03/28/18  Yes Malvin Johns, MD  ACCU-CHEK AVIVA PLUS test strip TEST three times a day 11/25/15   Lucious Groves, DO  Blood Glucose Monitoring Suppl (ACCU-CHEK AVIVA PLUS) w/Device KIT Use to check you blood sugar 3 times a day 06/04/15   Oval Linsey, MD    Physical Exam: Vitals:   03/31/18 2315 03/31/18 2330 04/01/18 0015 04/01/18 0030  BP: (!) 123/55 (!) 117/51  140/62 (!) 115/53  Pulse: 65 69 78 73  Resp:      Temp:      TempSrc:      SpO2: 97% 100% 100% 98%    Constitutional: NAD, calm, comfortable Eyes: PERRL, lids and conjunctivae normal ENMT: Mucous membranes are moist. Posterior pharynx clear of any exudate or lesions.Normal dentition.  Neck: normal, supple, no masses, no thyromegaly Respiratory: clear to auscultation bilaterally, no wheezing, no crackles. Normal respiratory effort. No accessory muscle use.  Cardiovascular: Regular rate and rhythm, no murmurs / rubs / gallops. No extremity edema. 2+ pedal pulses. No carotid bruits.  Abdomen: no tenderness, no masses palpated. No hepatosplenomegaly. Bowel sounds positive.  Musculoskeletal: no clubbing / cyanosis. No joint deformity upper and lower extremities. Good ROM, no contractures. Normal muscle tone.  Skin: no rashes, lesions, ulcers. No induration Neurologic: CN 2-12 grossly intact. Sensation intact, DTR normal. Strength 5/5 in all 4.  Psychiatric: Normal judgment and insight. Alert and oriented x 3. Normal mood.    Labs on Admission: I have personally reviewed following labs and imaging studies  CBC: Recent Labs  Lab 03/28/18 1108 03/31/18 2214  WBC 5.9 5.6  HGB 10.1* 9.7*  HCT 33.0* 31.3*  MCV 88.0 86.7  PLT 164 562   Basic Metabolic Panel: Recent Labs  Lab 03/28/18 1108 03/31/18 2214  NA 138 139  K 4.7 3.9  CL 109 110  CO2 21* 21*  GLUCOSE 252* 62*  BUN 30* 25*  CREATININE 1.59* 1.38*  CALCIUM 8.7* 8.8*   GFR: CrCl cannot be calculated (Unknown ideal weight.). Liver Function Tests: Recent Labs  Lab 03/28/18 1108 03/31/18 2214  AST 36 39  ALT 44 45*  ALKPHOS 102 100  BILITOT 0.4 0.4  PROT 7.2 6.8  ALBUMIN 2.9* 2.8*   Recent Labs  Lab 03/28/18 1108 03/31/18 2214  LIPASE 14 19   No results for input(s): AMMONIA in the last 168 hours. Coagulation Profile: No results for input(s): INR, PROTIME in the last 168 hours. Cardiac Enzymes: No results  for input(s): CKTOTAL, CKMB, CKMBINDEX, TROPONINI in the last 168 hours. BNP (last 3 results) No results for input(s): PROBNP in the last 8760 hours. HbA1C: No results for input(s): HGBA1C in the last 72 hours. CBG: Recent Labs  Lab 03/31/18 2318  GLUCAP 145*   Lipid Profile: No results for input(s): CHOL, HDL, LDLCALC, TRIG, CHOLHDL, LDLDIRECT in the last 72 hours. Thyroid Function Tests: No results for input(s): TSH, T4TOTAL, FREET4, T3FREE, THYROIDAB in the last 72 hours. Anemia Panel: No results for input(s): VITAMINB12, FOLATE, FERRITIN, TIBC, IRON, RETICCTPCT in the last 72 hours. Urine analysis:    Component Value Date/Time   COLORURINE YELLOW 03/31/2018 2151   APPEARANCEUR CLEAR 03/31/2018 2151   LABSPEC 1.010 03/31/2018 2151   PHURINE 5.0 03/31/2018 2151   GLUCOSEU 50 (A) 03/31/2018 2151   GLUCOSEU NEG mg/dL 04/05/2007 1803   HGBUR NEGATIVE 03/31/2018 2151   BILIRUBINUR NEGATIVE 03/31/2018 2151   [place] NEGATIVE 03/31/2018 2151   PROTEINUR 30 (A) 03/31/2018 2151   UROBILINOGEN 0.2 11/06/2014 0005   NITRITE NEGATIVE 03/31/2018 2151   LEUKOCYTESUR NEGATIVE 03/31/2018 2151    Radiological Exams on Admission: Ct Abdomen Pelvis W Contrast  Result Date: 03/31/2018 CLINICAL DATA:  Patient seen on Monday for abdominal pain. Returns today for worsening abdominal pain. Treatment for ulcers without improvement. Constipation. EXAM: CT ABDOMEN AND PELVIS WITH CONTRAST TECHNIQUE: Multidetector CT imaging of the abdomen and pelvis was performed using the standard protocol following bolus administration of intravenous contrast. CONTRAST:  50m OMNIPAQUE IOHEXOL 300 MG/ML  SOLN COMPARISON:  MRI abdomen 05/11/2017. CT liver 04/26/2017 FINDINGS: Lower chest: Mild dependent changes in the lung bases. Hepatobiliary: Liver parenchymal pattern is somewhat coarsened without focal lesion identified. This may indicate cirrhosis. Gallbladder is surgically absent. Bile ducts are dilated,  probably normal for postoperative physiology. Pancreas: Scattered calcifications throughout the pancreas consistent with changes due to chronic pancreatitis. Heterogeneous appearance of the head of the pancreas with indistinct margins and surrounding edema. These changes have developed since the previous study. This likely represents focal pancreatitis. However, follow-up after resolution of acute process is recommended to exclude underlying pancreatic head mass. No mass was suggested on the previous study. Loss of distinction and hypoenhancement of the parenchyma in the head of the pancreas  may indicate early pancreatic necrosis. No loculated collections. Spleen: Normal in size without focal abnormality. Adrenals/Urinary Tract: No adrenal gland nodules. Subcentimeter cysts in the renal parenchyma bilaterally. Nephrograms are otherwise homogeneous and symmetrical. No hydronephrosis or hydroureter. Bladder is unremarkable. Stomach/Bowel: Stomach, small bowel, and colon are not abnormally distended. The colon is diffusely filled with stool suggesting constipation. No wall thickening or inflammatory changes identified in the colon. Appendix is not identified. Vascular/Lymphatic: Aortic atherosclerosis. No enlarged abdominal or pelvic lymph nodes. Reproductive: Uterus is displaced towards the right. No pelvic or adnexal mass. Other: No free air or free fluid in the abdomen. Small right inguinal hernia containing fat. Musculoskeletal: Degenerative changes in the lumbar spine. No destructive bone lesions. IMPRESSION: 1. Enlarged and heterogeneous appearance of the pancreatic head with surrounding edema likely representing focal pancreatitis. Suggestion of early pancreatic necrosis. No loculated fluid collections. Follow-up after resolution of acute process is recommended to exclude underlying pancreatic head mass. 2. Underlying changes of chronic pancreatitis. 3. Probable hepatic cirrhosis. 4. Stool-filled colon  suggesting constipation. 5. Small right inguinal hernia containing fat. Aortic Atherosclerosis (ICD10-I70.0). Electronically Signed   By: Lucienne Capers M.D.   On: 03/31/2018 23:56    EKG: Independently reviewed.  Assessment/Plan Principal Problem:   Acute pancreatitis with uninfected necrosis Active Problems:   Uncontrolled type 2 diabetes mellitus with chronic kidney disease, without long-term current use of insulin (HCC)   Essential hypertension   Chronic pancreatitis ([place])   CKD stage 3 due to type 2 diabetes mellitus (HCC)   Constipation    1. Acute pancreatitis - (with h/o chronic pancreatitis, pancreatic insufficiency) 1. Complicated by early necrosis as seen on CT scan 2. No SIRS at this point to suggest infection or indicate need for ABx therapy 3. IVF 4. NPO 5. Morphine PRN pain 6. zofran PRN nausea 7. Repeat labs in AM 2. Constipation - 1. Dulcolax PRN 3. HTN - 1. Holding home norvasc for the moment in case we see rapid worsening of pancreatitis 2. Use short acting IV meds if needed 4. DM2 - 1. Holding home PO hypoglycemics 2. Sensitive SSI Q4H 5. CKD stage 3 - chronic and stable  DVT prophylaxis: Lovenox Code Status: Full Family Communication: No family in room Disposition Plan: Home after admit Consults called: None Admission status: Admit to inpatient  Severity of Illness: The appropriate patient status for this patient is INPATIENT. Inpatient status is judged to be reasonable and necessary in order to provide the required intensity of service to ensure the patient's safety. The patient's presenting symptoms, physical exam findings, and initial radiographic and laboratory data in the context of their chronic comorbidities is felt to place them at high risk for further clinical deterioration. Furthermore, it is not anticipated that the patient will be medically stable for discharge from the hospital within 2 midnights of admission. The following factors  support the patient status of inpatient.   " The patient's presenting symptoms include Abd pain. " The worrisome physical exam findings include Abd TTP. " The initial radiographic and laboratory data are worrisome because of Acute pancreatitis complicated by necrosis. " The chronic co-morbidities include chronic pancreatitis, DM2, HTN.   * I certify that at the point of admission it is my clinical judgment that the patient will require inpatient hospital care spanning beyond 2 midnights from the point of admission due to high intensity of service, high risk for further deterioration and high frequency of surveillance required.Alcario Drought, JARED M. DO [facility]  How to contact the [facility] Attending or Consulting provider [place] or covering provider during after hours [place], for this patient?  1. Check the care team in [facility] and look for a) attending/consulting [facility] provider listed and b) the [facility] team listed 2. Log into [email or web address]  Amion Physician Scheduling and messaging for groups and whole hospitals  On call and physician scheduling software for group practices, residents, hospitalists and other medical providers for call, clinic, rotation and shift schedules. OnCall [employer] is a hospital-wide system for scheduling doctors and paging doctors on call. EasyPlot is for scientific plotting and data analysis.  [email or web address]  and use [place]'s universal password to access. If you do not have the password, please contact the hospital operator.  3. Locate the [facility] provider you are looking for under [facility] and page to a number that you can be directly reached. 4. If you still have difficulty reaching the provider, please page the [facility] (Director on Call) for the Hospitalists listed on amion for assistance.  04/01/2018, 12:55 AM

## 2018-04-01 NOTE — Progress Notes (Signed)
PROGRESS NOTE    [name]  [id number]:[id number] DOB: 24-Jul-1950 DOA: 03/31/2018 PCP: Nolene Ebbs, MD   Brief Narrative:  HPI on 04/01/2018 by Dr. Jennette Kettle [name] is a 68 y.o. female with medical history significant of DM2, HTN, chronic pancreatitis though pancreatitis hasnt bothered her "in a long time" she says.  Presented to the ED on Monday for abd pain.  Comes back today with worsening pain.  Symptoms initially started about 1 week ago, fairly constant, radiates to back.  Worse when she eats.  Antacids and carafate provided no improvement.  Associated nausea.  No diarrhea.  Assessment & Plan   Patient admitted earlier today by Dr. Jennette Kettle.  See H&P for details.  Acute on chronic pancreatitis -Complicated by early necrosis seen on CT scan -Will discuss with gastroenterology -Continue NPO, IV fluids, pain control, antiemetics as needed  Constipation -Continue Dulcolax as needed  Essential hypertension -Norvasc held, continue IV medications if needed  Diabetes mellitus, type II -Home medications held, continue insulin sliding scale with CBG monitoring  Chronic kidney disease, stage III -Appears to be stable, continue monitor BMP  DVT Prophylaxis Lovenox  Code Status: Full  Family Communication: None at bedside  Disposition Plan: Admitted.  Suspect patient will need IV fluids for a couple of days.  Also will discuss with gastroenterology.  Suspect home when stable.  Consultants Gastroenterology  Procedures  None  Antibiotics   Anti-infectives (From admission, onward)   None      Subjective:   [name] seen and examined today.  Continues to have abdominal pain with some nausea.  Has not had any vomiting since admission.  Denies current chest pain, shortness of breath, headache or dizziness.  Objective:   Vitals:   04/01/18 0155 04/01/18 0419 04/01/18 0837 04/01/18 1206  BP: 139/75 134/71 139/66 (!) 131/58  Pulse: 82 66 75 66  Resp:  18 19    Temp: 98.3 F (36.8 C) 98 F (36.7 C)  98.6 F (37 C)  TempSrc: Oral   Oral  SpO2: 100% 95%  98%   No intake or output data in the 24 hours ending 04/01/18 1615 There were no vitals filed for this visit.  Exam  None patient admitted this morning   Data Reviewed: I have personally reviewed following labs and imaging studies  CBC: Recent Labs  Lab 03/28/18 1108 03/31/18 2214 04/01/18 0402  WBC 5.9 5.6 3.2*  HGB 10.1* 9.7* 9.5*  HCT 33.0* 31.3* 30.4*  MCV 88.0 86.7 85.9  PLT 164 160 448*   Basic Metabolic Panel: Recent Labs  Lab 03/28/18 1108 03/31/18 2214 04/01/18 0402  NA 138 139 139  K 4.7 3.9 3.6  CL 109 110 111  CO2 21* 21* 21*  GLUCOSE 252* 62* 114*  BUN 30* 25* 20  CREATININE 1.59* 1.38* 1.20*  CALCIUM 8.7* 8.8* 8.2*   GFR: CrCl cannot be calculated (Unknown ideal weight.). Liver Function Tests: Recent Labs  Lab 03/28/18 1108 03/31/18 2214 04/01/18 0402  AST 36 39 51*  ALT 44 45* 43  ALKPHOS 102 100 98  BILITOT 0.4 0.4 0.3  PROT 7.2 6.8 6.6  ALBUMIN 2.9* 2.8* 2.5*   Recent Labs  Lab 03/28/18 1108 03/31/18 2214  LIPASE 14 19   No results for input(s): AMMONIA in the last 168 hours. Coagulation Profile: No results for input(s): INR, PROTIME in the last 168 hours. Cardiac Enzymes: No results for input(s): CKTOTAL, CKMB, CKMBINDEX, TROPONINI in the last 168 hours.  BNP (last 3 results) No results for input(s): PROBNP in the last 8760 hours. HbA1C: No results for input(s): HGBA1C in the last 72 hours. CBG: Recent Labs  Lab 04/01/18 0401 04/01/18 0456 04/01/18 0821 04/01/18 0948 04/01/18 1147  GLUCAP 115* 72 57* 85 77   Lipid Profile: No results for input(s): CHOL, HDL, LDLCALC, TRIG, CHOLHDL, LDLDIRECT in the last 72 hours. Thyroid Function Tests: No results for input(s): TSH, T4TOTAL, FREET4, T3FREE, THYROIDAB in the last 72 hours. Anemia Panel: No results for input(s): VITAMINB12, FOLATE, FERRITIN, TIBC, IRON,  RETICCTPCT in the last 72 hours. Urine analysis:    Component Value Date/Time   COLORURINE YELLOW 03/31/2018 2151   APPEARANCEUR CLEAR 03/31/2018 2151   LABSPEC 1.010 03/31/2018 2151   PHURINE 5.0 03/31/2018 2151   GLUCOSEU 50 (A) 03/31/2018 2151   GLUCOSEU NEG mg/dL 04/05/2007 1803   HGBUR NEGATIVE 03/31/2018 2151   [place] NEGATIVE 03/31/2018 2151   [place] NEGATIVE 03/31/2018 2151   PROTEINUR 30 (A) 03/31/2018 2151   UROBILINOGEN 0.2 11/06/2014 0005   NITRITE NEGATIVE 03/31/2018 2151   LEUKOCYTESUR NEGATIVE 03/31/2018 2151   Sepsis Labs: @LABRCNTIP (procalcitonin:4,lacticidven:4)  )No results found for this or any previous visit (from the past 240 hour(s)).    Radiology Studies: Ct Abdomen Pelvis W Contrast  Result Date: 03/31/2018 CLINICAL DATA:  Patient seen on Monday for abdominal pain. Returns today for worsening abdominal pain. Treatment for ulcers without improvement. Constipation. EXAM: CT ABDOMEN AND PELVIS WITH CONTRAST TECHNIQUE: Multidetector CT imaging of the abdomen and pelvis was performed using the standard protocol following bolus administration of intravenous contrast. CONTRAST:  64mL OMNIPAQUE IOHEXOL 300 MG/ML  SOLN COMPARISON:  MRI abdomen 05/11/2017. CT liver 04/26/2017 FINDINGS: Lower chest: Mild dependent changes in the lung bases. Hepatobiliary: Liver parenchymal pattern is somewhat coarsened without focal lesion identified. This may indicate cirrhosis. Gallbladder is surgically absent. Bile ducts are dilated, probably normal for postoperative physiology. Pancreas: Scattered calcifications throughout the pancreas consistent with changes due to chronic pancreatitis. Heterogeneous appearance of the head of the pancreas with indistinct margins and surrounding edema. These changes have developed since the previous study. This likely represents focal pancreatitis. However, follow-up after resolution of acute process is recommended to exclude underlying pancreatic  head mass. No mass was suggested on the previous study. Loss of distinction and hypoenhancement of the parenchyma in the head of the pancreas may indicate early pancreatic necrosis. No loculated collections. Spleen: Normal in size without focal abnormality. Adrenals/Urinary Tract: No adrenal gland nodules. Subcentimeter cysts in the renal parenchyma bilaterally. Nephrograms are otherwise homogeneous and symmetrical. No hydronephrosis or hydroureter. Bladder is unremarkable. Stomach/Bowel: Stomach, small bowel, and colon are not abnormally distended. The colon is diffusely filled with stool suggesting constipation. No wall thickening or inflammatory changes identified in the colon. Appendix is not identified. Vascular/Lymphatic: Aortic atherosclerosis. No enlarged abdominal or pelvic lymph nodes. Reproductive: Uterus is displaced towards the right. No pelvic or adnexal mass. Other: No free air or free fluid in the abdomen. Small right inguinal hernia containing fat. Musculoskeletal: Degenerative changes in the lumbar spine. No destructive bone lesions. IMPRESSION: 1. Enlarged and heterogeneous appearance of the pancreatic head with surrounding edema likely representing focal pancreatitis. Suggestion of early pancreatic necrosis. No loculated fluid collections. Follow-up after resolution of acute process is recommended to exclude underlying pancreatic head mass. 2. Underlying changes of chronic pancreatitis. 3. Probable hepatic cirrhosis. 4. Stool-filled colon suggesting constipation. 5. Small right inguinal hernia containing fat. Aortic Atherosclerosis (ICD10-I70.0). Electronically Signed   By:  Lucienne Capers M.D.   On: 03/31/2018 23:56     Scheduled Meds: . aspirin EC  81 mg Oral Daily  . dextrose  1 Tube Oral Once  . enoxaparin (LOVENOX) injection  40 mg Subcutaneous Q24H  . famotidine  20 mg Oral Daily  . insulin aspart  0-9 Units Subcutaneous Q4H  . linaclotide  145 mcg Oral Daily  . multivitamin  with minerals  1 tablet Oral Q breakfast  . rosuvastatin  20 mg Oral Daily  . sodium chloride flush  3 mL Intravenous Once  . sucralfate  1 g Oral TID WC & HS   Continuous Infusions: . sodium chloride 100 mL/hr at 04/01/18 0324  . dextrose 25 mL/hr at 04/01/18 0323     LOS: 0 days   Time Spent in minutes   30 minutes  Doug Bucklin D.O. on 04/01/2018 at 4:15 PM  Between 7am to 7pm - Please see pager noted on [email or web address]  After 7pm go to [email or web address]  And look for the night coverage person covering for me after hours  [facility] Office  [phone]

## 2018-04-02 DIAGNOSIS — K5900 Constipation, unspecified: Secondary | ICD-10-CM | POA: Diagnosis not present

## 2018-04-02 DIAGNOSIS — K861 Other chronic pancreatitis: Secondary | ICD-10-CM | POA: Diagnosis not present

## 2018-04-02 DIAGNOSIS — E1122 Type 2 diabetes mellitus with diabetic chronic kidney disease: Secondary | ICD-10-CM | POA: Diagnosis not present

## 2018-04-02 DIAGNOSIS — K8591 Acute pancreatitis with uninfected necrosis, unspecified: Secondary | ICD-10-CM | POA: Diagnosis not present

## 2018-04-02 LAB — COMPREHENSIVE METABOLIC PANEL
ALT: 37 U/L (ref 0–44)
AST: 34 U/L (ref 15–41)
Albumin: 2.4 g/dL — ABNORMAL LOW (ref 3.5–5.0)
Alkaline Phosphatase: 102 U/L (ref 38–126)
Anion gap: 8 (ref 5–15)
BUN: 8 mg/dL (ref 8–23)
CO2: 20 mmol/L — ABNORMAL LOW (ref 22–32)
Calcium: 8.2 mg/dL — ABNORMAL LOW (ref 8.9–10.3)
Chloride: 109 mmol/L (ref 98–111)
Creatinine, Ser: 0.92 mg/dL (ref 0.44–1.00)
GFR calc Af Amer: >60 mL/min (ref >60)
GFR calc non Af Amer: >60 mL/min (ref >60)
Glucose, Bld: 229 mg/dL — ABNORMAL HIGH (ref 70–99)
POTASSIUM: 3.9 mmol/L (ref 3.5–5.1)
Sodium: 137 mmol/L (ref 135–145)
TOTAL PROTEIN: 6.5 g/dL (ref 6.5–8.1)
Total Bilirubin: 0.4 mg/dL (ref 0.3–1.2)

## 2018-04-02 LAB — CBC
HCT: 31.3 % — ABNORMAL LOW (ref 36.0–46.0)
HEMOGLOBIN: 9.9 g/dL — AB (ref 12.0–15.0)
MCH: 27 pg (ref 26.0–34.0)
MCHC: 31.6 g/dL (ref 30.0–36.0)
MCV: 85.3 fL (ref 80.0–100.0)
Platelets: 138 10*3/uL — ABNORMAL LOW (ref 150–400)
RBC: 3.67 MIL/uL — ABNORMAL LOW (ref 3.87–5.11)
RDW: 14.6 % (ref 11.5–15.5)
WBC: 2.9 10*3/uL — ABNORMAL LOW (ref 4.0–10.5)
nRBC: 0 % (ref 0.0–0.2)

## 2018-04-02 LAB — GLUCOSE, CAPILLARY
Glucose-Capillary: 199 mg/dL — ABNORMAL HIGH (ref 70–99)
Glucose-Capillary: 277 mg/dL — ABNORMAL HIGH (ref 70–99)
Glucose-Capillary: 284 mg/dL — ABNORMAL HIGH (ref 70–99)
Glucose-Capillary: 45 mg/dL — ABNORMAL LOW (ref 70–99)
Glucose-Capillary: 86 mg/dL (ref 70–99)

## 2018-04-02 NOTE — Progress Notes (Signed)
Pt called and said she was feeling weak.  Blood sugar 28.  Snacks and juice given, Blood sugar eventually came up to 86.  Low CBG noted at 2311 (3 units of insulin given at 2110 for CBG 226) States feels fine now.  Patient is drinking liquids, including carb liquids without difficulty or nausea.  Continue to monitor.

## 2018-04-02 NOTE — Plan of Care (Signed)
  Problem: Education: Goal: Knowledge of General Education information will improve Description: Including pain rating scale, medication(s)/side effects and non-pharmacologic comfort measures Outcome: Progressing   Problem: Health Behavior/Discharge Planning: Goal: Ability to manage health-related needs will improve Outcome: Progressing   Problem: Clinical Measurements: Goal: Ability to maintain clinical measurements within normal limits will improve Outcome: Progressing Goal: Will remain free from infection Outcome: Progressing Goal: Diagnostic test results will improve Outcome: Progressing Goal: Respiratory complications will improve Outcome: Progressing Goal: Cardiovascular complication will be avoided Outcome: Progressing   Problem: Nutrition: Goal: Adequate nutrition will be maintained Outcome: Progressing   Problem: Coping: Goal: Level of anxiety will decrease Outcome: Progressing   Problem: Pain Managment: Goal: General experience of comfort will improve Outcome: Progressing   Problem: Skin Integrity: Goal: Risk for impaired skin integrity will decrease Outcome: Progressing   

## 2018-04-02 NOTE — Discharge Instructions (Signed)
Acute Pancreatitis ° °Acute pancreatitis happens when the pancreas gets swollen. The pancreas is a large gland behind the stomach. The pancreas helps control blood sugar. It also makes enzymes that help digest food. This condition happens when the enzymes attack the pancreas and damage it. Most attacks last a couple of days and are dangerous. The lungs, heart, and kidneys may stop working. °What are the causes? °· Alcohol abuse. °· Drug abuse. °· Gallstones. °· Some medicines. °· Some chemicals. °· Infection. °· Damage caused by an accident.. °· Belly (abdominal) surgery. °· In some cases, the cause is not known. °What are the signs or symptoms? °· Pain in the upper belly and back. °· Swelling of the belly °· Feeling sick to your stomach (nausea) and throwing up (vomiting). °How is this treated? °· You will probably have to stay in the hospital. °? Treatment may include: °§ Fluid through an IV. °§ A tube to remove stomach contents and stop you from throwing up. °§ Not eating for 3-4 days. °§ Pain medicine. °§ Antibiotic medicines if you have an infection. °§ Surgery on the pancreas or gallbladder. °Follow these instructions at home: °Eating and drinking ° °· Follow instructions from your doctor about diet. °· Eat small meals often. Avoid eating big meals. °· Eat foods that do not have a lot of fat in them. °· Drink enough fluid to keep your pee (urine) pale yellow. °· Do not drink alcohol if it caused your condition. °General instructions °· Take over-the-counter and prescription medicines only as told by your doctor. °· Do not use cigarettes, e-cigarettes, and chewing tobacco. If you need help quitting, ask your doctor. °· Get plenty of rest. °· If directed, check your blood sugar at home as told by your doctor. °· Keep all follow-up visits as told by your doctor. This is important. °Contact a doctor if: °· You do not get better as quickly as expected. °· You have new symptoms. °· Your symptoms get worse. °· You  have lasting pain or weakness. °· You continue to feel sick to your stomach. °· You get better and then you have another pain attack. °· You have a fever. °Get help right away if: °· You cannot eat or keep fluids down. °· Your pain becomes very bad. °· Your skin or the white part of your eyes turns yellow. °· You throw up. °· You feel dizzy or you pass out. °· Your blood sugar is high (over 300 mg/dL). °Summary °· Acute pancreatitis happens when the pancreas gets swollen. °· This condition is usually caused by alcohol abuse, drug abuse, or gallstones. °· You will probably have to stay in the hospital for treatment. °This information is not intended to replace advice given to you by your health care provider. Make sure you discuss any questions you have with your health care provider. °Document Released: 07/29/2007 Document Revised: 06/15/2016 Document Reviewed: 11/13/2014 °Elsevier Interactive Patient Education © 2019 [employer] ° °

## 2018-04-02 NOTE — Discharge Summary (Signed)
Physician Discharge Summary  [name]:[id number] DOB: August 22, 1950 DOA: 03/31/2018  PCP: Nolene Ebbs, MD  Admit date: 03/31/2018 Discharge date: 04/02/2018  Time spent: 45 minutes  Recommendations for Outpatient Follow-up:  Patient will be discharged to home.  Patient will need to follow up with primary care provider within one week of discharge.  Follow up with gastroenterology, Dr. Henrene Pastor, on 2/18. Patient should continue medications as prescribed.  Patient should follow a heart healthy/carb modifid diet.   Discharge Diagnoses:  Principal Problem: Acute on chronic pancreatitis Constipation Essential hypertension Diabetes mellitus, type II Chronic kidney disease, stage III  Discharge Condition: Stable  Diet recommendation: heart   Filed Weights   04/01/18 2000  Weight: 61 kg    History of present illness:  on 04/01/2018 by Dr. Jennette Kettle Kiara C Lawsis a 68 y.o.femalewith medical history significant ofDM2, HTN, chronic pancreatitis though pancreatitis hasnt bothered her "in a long time" she says.  Presented to the ED on Monday for abd pain. Comes back today with worsening pain. Symptoms initially started about 1 week ago, fairly constant, radiates to back. Worse when she eats. Antacids and carafate provided no improvement. Associated nausea. No diarrhea.  Hospital Course:  Acute on chronic pancreatitis -Complicated by early necrosis seen on CT scan -No further complaints of abdominal pain.  Patient was placed on IV fluids, pain control and antiemetics. -Discussed CT findings with Dr. Loletha Carrow, gastroenterology.  CT findings likely severe inflammation that can lead cell death. If patient is clinically doing well, would treat symptomatically.  -Diet was advanced and tolerated  Constipation -Resolved.   Essential hypertension -Norvasc held, but may continue on discharge   Diabetes mellitus, type II -Continue home medications on discharge  Chronic  kidney disease, stage III -Appears to be stable  Procedures: None  Consultations: Gastroenterology, via phone  Discharge Exam: Vitals:   04/01/18 2000 04/02/18 0407  BP: (!) 157/67 129/87  Pulse: 70 83  Resp:    Temp: 98.7 F (37.1 C) 97.7 F (36.5 C)  SpO2: 100% 100%     General: Well developed, well nourished, NAD, appears stated age  HEENT: NCAT, mucous membranes moist.  Neck: Supple  Cardiovascular: S1 S2 auscultated, RRR, +SEM  Respiratory: Clear to auscultation bilaterally with equal chest rise  Abdomen: Soft, nontender, nondistended, + bowel sounds  Extremities: warm dry without cyanosis clubbing or edema  Neuro: AAOx3, nonfocal  Psych: Normal affect and demeanor  Discharge Instructions Discharge Instructions    Discharge instructions   Complete by:  As directed    Patient will be discharged to home.  Patient will need to follow up with primary care provider within one week of discharge.  Follow up with gastroenterology, Dr. Henrene Pastor, on 2/18. Patient should continue medications as prescribed.  Patient should follow a heart healthy/carb modifid diet.     Allergies as of 04/02/2018      Reactions   Atorvastatin Other (See Comments)   Caused muscles in her back to be very sore   Gabapentin Swelling   Legs and feet swell   Nicorette [nicotine] Nausea Only      Medication List    TAKE these medications   ACCU-CHEK AVIVA PLUS test strip Generic drug:  glucose blood TEST three times a day   ACCU-CHEK AVIVA PLUS w/Device Kit Use to check you blood sugar 3 times a day   amLODipine 10 MG tablet Commonly known as:  NORVASC Take 10 mg by mouth daily.   aspirin EC 81 MG  tablet Take 1 tablet (81 mg total) by mouth daily.   cyclobenzaprine 10 MG tablet Commonly known as:  FLEXERIL Take 1 tablet (10 mg total) by mouth 3 (three) times daily as needed for muscle spasms.   diclofenac sodium 1 % Gel Commonly known as:  VOLTAREN Apply 4 g topically 3  (three) times daily as needed. What changed:  reasons to take this   LANTUS SOLOSTAR 100 UNIT/ML Solostar Pen Generic drug:  Insulin Glargine Inject 20 Units into the skin daily after breakfast.   LINZESS 145 MCG Caps capsule Generic drug:  linaclotide Take 145 mcg by mouth daily.   NONFORMULARY OR COMPOUNDED ITEM Apply 1-2 g topically 4 (four) times daily. [facility]  Peripheral Neuropathy Cream- Bupivacaine 1%, Doxepin 3%, Gabapentin 6%, Pentoxifylline 3%, Topiramate 1% Apply 1-2 grams to affected area 3-4 times daily Qty. 120 gm 3 refills   ONE-A-DAY WOMENS 50+ ADVANTAGE Tabs Take 1 tablet by mouth daily with breakfast.   Pancrelipase (Lip-Prot-Amyl) 24000-76000 units Cpep Take 4 capsules (96,000 Units total) by mouth See admin instructions. Take 4 capsules by mouth 2-3 times daily before meals What changed:    how much to take  additional instructions   PROAIR HFA 108 (90 Base) MCG/ACT inhaler Generic drug:  albuterol Inhale 2 puffs into the lungs 4 (four) times daily as needed for wheezing or shortness of breath.   ranitidine 150 MG tablet Commonly known as:  ZANTAC Take 1 tablet (150 mg total) by mouth daily.   rosuvastatin 20 MG tablet Commonly known as:  CRESTOR Take 20 mg by mouth daily.   sitaGLIPtin 50 MG tablet Commonly known as:  JANUVIA Take 1 tablet (50 mg total) by mouth daily.   sucralfate 1 g tablet Commonly known as:  CARAFATE Take 1 tablet (1 g total) by mouth 4 (four) times daily -  with meals and at bedtime.      Allergies  Allergen Reactions  . Atorvastatin Other (See Comments)    Caused muscles in her back to be very sore  . Gabapentin Swelling    Legs and feet swell  . Nicorette [Nicotine] Nausea Only   Follow-up Information    Nolene Ebbs, MD. Schedule an appointment as soon as possible for a visit in 1 week(s).   Specialty:  Internal Medicine Why:  Hospital follow up Contact information: [place] [address] [phone]            The results of significant diagnostics from this hospitalization (including imaging, microbiology, ancillary and laboratory) are listed below for reference.    Significant Diagnostic Studies: Dg Chest 2 View  Result Date: 03/13/2018 CLINICAL DATA:  68 year old female with chest pain, back pain, shortness of breath. EXAM: CHEST - 2 VIEW COMPARISON:  02/26/2018 and earlier. FINDINGS: Lung volumes and mediastinal contours are stable. Chronic linear opacity at the left lung base is stable and most likely scarring. Lesser linear changes at the right lung base which were more apparent by CT in 2019. No pneumothorax, pulmonary edema, pleural effusion or acute pulmonary opacity. Visualized tracheal air column is within normal limits. No acute osseous abnormality identified. Negative visible bowel gas pattern. Stable cholecystectomy clips. Abdominal Calcified aortic atherosclerosis. IMPRESSION: No acute cardiopulmonary abnormality.  Chronic lung base scarring. Electronically Signed   By: Genevie Ann M.D.   On: 03/13/2018 17:02   Ct Angio Chest Pe W Or Wo Contrast  Result Date: 03/13/2018 CLINICAL DATA:  Pt c/o worsening back pain with difficulty  breathing x weeks. 52 ml isovue 370 iv^48m ISOVUE-370 IOPAMIDOL (ISOVUE-370) INJECTION 76%PE suspected, high pretest prob EXAM: CT ANGIOGRAPHY CHEST WITH CONTRAST TECHNIQUE: Multidetector CT imaging of the chest was performed using the standard protocol during bolus administration of intravenous contrast. Multiplanar CT image reconstructions and MIPs were obtained to evaluate the vascular anatomy. CONTRAST:  761mISOVUE-370 IOPAMIDOL (ISOVUE-370) INJECTION 76% COMPARISON:  CT 10/05/2017 FINDINGS: Cardiovascular: No filling defects within the pulmonary arteries to suggest acute pulmonary embolism. No acute findings of the aorta or great vessels. No pericardial fluid. Pulmonary artery is enlarged at 35 mm.  Mediastinum/Nodes: No axillary or supraclavicular adenopathy. No mediastinal hilar adenopathy. No pericardial effusion. Esophagus normal. Lungs/Pleura: No pulmonary infarction. No consolidation. Mild interstitial edema in the lower lobes. Small subpleural nodule in the RIGHT upper lobe measuring 4 mm (image 45/7). No change. Additional nodules along the RIGHT horizontal fissure less than 5 mm. Upper Abdomen: Limited view of the liver, kidneys, pancreas are unremarkable. Normal adrenal glands. Musculoskeletal: Multiple levels of vertebral body sclerosis involving T2 through T6 are progressed from 03/24/2017 Review of the MIP images confirms the above findings. IMPRESSION: 1. No evidence acute pulmonary embolism. 2. Mild interstitial edema pattern. 3. Large pulmonary artery suggest pulmonary hypertension. 4. RIGHT upper lobe pulmonary nodule.  Unchanged. 5. Sclerosis of the upper thoracic spine vertebral bodies progressive from comparison exam 1 year prior. Favor degenerative change. Cannot exclude sclerotic skeletal metastasis. Electronically Signed   By: StSuzy Bouchard.D.   On: 03/13/2018 20:21   Ct Abdomen Pelvis W Contrast  Result Date: 03/31/2018 CLINICAL DATA:  Patient seen on Monday for abdominal pain. Returns today for worsening abdominal pain. Treatment for ulcers without improvement. Constipation. EXAM: CT ABDOMEN AND PELVIS WITH CONTRAST TECHNIQUE: Multidetector CT imaging of the abdomen and pelvis was performed using the standard protocol following bolus administration of intravenous contrast. CONTRAST:  7532mMNIPAQUE IOHEXOL 300 MG/ML  SOLN COMPARISON:  MRI abdomen 05/11/2017. CT liver 04/26/2017 FINDINGS: Lower chest: Mild dependent changes in the lung bases. Hepatobiliary: Liver parenchymal pattern is somewhat coarsened without focal lesion identified. This may indicate cirrhosis. Gallbladder is surgically absent. Bile ducts are dilated, probably normal for postoperative physiology. Pancreas:  Scattered calcifications throughout the pancreas consistent with changes due to chronic pancreatitis. Heterogeneous appearance of the head of the pancreas with indistinct margins and surrounding edema. These changes have developed since the previous study. This likely represents focal pancreatitis. However, follow-up after resolution of acute process is recommended to exclude underlying pancreatic head mass. No mass was suggested on the previous study. Loss of distinction and hypoenhancement of the parenchyma in the head of the pancreas may indicate early pancreatic necrosis. No loculated collections. Spleen: Normal in size without focal abnormality. Adrenals/Urinary Tract: No adrenal gland nodules. Subcentimeter cysts in the renal parenchyma bilaterally. Nephrograms are otherwise homogeneous and symmetrical. No hydronephrosis or hydroureter. Bladder is unremarkable. Stomach/Bowel: Stomach, small bowel, and colon are not abnormally distended. The colon is diffusely filled with stool suggesting constipation. No wall thickening or inflammatory changes identified in the colon. Appendix is not identified. Vascular/Lymphatic: Aortic atherosclerosis. No enlarged abdominal or pelvic lymph nodes. Reproductive: Uterus is displaced towards the right. No pelvic or adnexal mass. Other: No free air or free fluid in the abdomen. Small right inguinal hernia containing fat. Musculoskeletal: Degenerative changes in the lumbar spine. No destructive bone lesions. IMPRESSION: 1. Enlarged and heterogeneous appearance of the pancreatic head with surrounding edema likely representing focal pancreatitis. Suggestion of early pancreatic necrosis. No loculated fluid  collections. Follow-up after resolution of acute process is recommended to exclude underlying pancreatic head mass. 2. Underlying changes of chronic pancreatitis. 3. Probable hepatic cirrhosis. 4. Stool-filled colon suggesting constipation. 5. Small right inguinal hernia  containing fat. Aortic Atherosclerosis (ICD10-I70.0). Electronically Signed   By: Lucienne Capers M.D.   On: 03/31/2018 23:56    Microbiology: No results found for this or any previous visit (from the past 240 hour(s)).   Labs: Basic Metabolic Panel: Recent Labs  Lab 03/28/18 1108 03/31/18 2214 04/01/18 0402 04/02/18 0728  NA 138 139 139 137  K 4.7 3.9 3.6 3.9  CL 109 110 111 109  CO2 21* 21* 21* 20*  GLUCOSE 252* 62* 114* 229*  BUN 30* 25* 20 8  CREATININE 1.59* 1.38* 1.20* 0.92  CALCIUM 8.7* 8.8* 8.2* 8.2*   Liver Function Tests: Recent Labs  Lab 03/28/18 1108 03/31/18 2214 04/01/18 0402 04/02/18 0728  AST 36 39 51* 34  ALT 44 45* 43 37  ALKPHOS 102 100 98 102  BILITOT 0.4 0.4 0.3 0.4  PROT 7.2 6.8 6.6 6.5  ALBUMIN 2.9* 2.8* 2.5* 2.4*   Recent Labs  Lab 03/28/18 1108 03/31/18 2214  LIPASE 14 19   No results for input(s): AMMONIA in the last 168 hours. CBC: Recent Labs  Lab 03/28/18 1108 03/31/18 2214 04/01/18 0402 04/02/18 0728  WBC 5.9 5.6 3.2* 2.9*  HGB 10.1* 9.7* 9.5* 9.9*  HCT 33.0* 31.3* 30.4* 31.3*  MCV 88.0 86.7 85.9 85.3  PLT 164 160 132* 138*   Cardiac Enzymes: No results for input(s): CKTOTAL, CKMB, CKMBINDEX, TROPONINI in the last 168 hours. BNP: BNP (last 3 results) Recent Labs    03/13/18 1608  BNP 121.1*    ProBNP (last 3 results) No results for input(s): PROBNP in the last 8760 hours.  CBG: Recent Labs  Lab 04/01/18 2335 04/02/18 0003 04/02/18 0400 04/02/18 0808 04/02/18 1200  GLUCAP 45* 86 199* 277* 284*       Signed:  Margit Batte  [facility] 04/02/2018, 1:21 PM

## 2018-04-02 NOTE — Progress Notes (Signed)
Pt given discharge instructions and education. Pt not in distress and understands all discharge instructions. Pt left with all personal belongings.

## 2018-04-04 ENCOUNTER — Other Ambulatory Visit: Payer: Self-pay | Admitting: Cardiology

## 2018-04-05 DIAGNOSIS — L97419 Non-pressure chronic ulcer of right heel and midfoot with unspecified severity: Secondary | ICD-10-CM | POA: Insufficient documentation

## 2018-04-05 NOTE — Progress Notes (Signed)
Subjective:   _0  ID@: [name], female    DOB: 09/24/1950, 68 y.o.   MRN: [id number]  Kiara Ebbs, [name]:  No chief complaint on file.   HPI   Patient with type 2 diabetes, chronic pancreatitis, CKD stage III, COPD, carotid stenosis diagnosed in October 2018, hypertension, PAD by LE duplex in July 2019, and hyperlipidemia.  She was initially evaluated by [place] for preoperative risk stratification for right foot diabetic foot ulceration debridement with Dr. March Rummage, in which was performed on 03/24/2018. She was also scheduled for laryngoscopy for tonsillar nodule with Dr. Reginia Naas; however, patient did not show for her procedure. Lexiscan nuclear stress test in July 2019 was considered low risk study. Echo performed also at that time showed normal LVEF with moderate AR. Lower extremity arterial duplex in July 2019 showed severely abnormal waveforms and moderateley decreased ABI on the left at 0.5 and moderatley abnormal waveform on the right and mild decrease in ABI. In view of lack of symptoms, medical therapy was recommended.  She is here on a 6 month office visit. She was admitted to [facility] from 02/07-02/08 for acute on chronic pancreatitis complicated by early necorsis on CT scan. She is being treated symptomatically by GI and is scheduled to see them next week.   I had started her on Crestor at her last office visit. That she is tolerating well. Has chronic back pain and is also to be evaluated by Ortho in the next few weeks. She denies any chest pain or dyspnea on exertion. No PND or orthopnea. She does mention occasional tightness in her thigh and fatigue with walking. She continues to have non-healing ulceration on her right foot. Reports not able to wear boot that was provided. States that Dr. March Rummage is wanting to do surgery to remove "bone in her foot" to help with her callous.   Past Medical History:  Diagnosis Date  . ANEMIA, CHRONIC DISEASE NEC 03/15/2006  .  Carotid artery occlusion   . Cirrhosis ([place])   . Colon polyps   . Constipation due to opioid therapy 06/05/2014  . DEGENERATIVE JOINT DISEASE 05/06/2007   On going for >5 yrs No imaging to confirm Has tried ultram, mobic, neurontin which did not work Ambulance person tried once worked well    . Diabetes mellitus    Type II  . DIABETIC  RETINOPATHY 03/15/2006  . 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 Diastolic dysfunction 62/83/6629   Grade 1 by Echocardiogram 12/13/14  . Elevated liver enzymes   . Heart murmur   . Hemorrhoids   . History of kidney stones   . Hyperlipidemia 04/01/2011  . Hypertension   . Mild AI (aortic insufficiency)   . Moderate aortic regurgitation 09/25/2008   12/13/14 Echocardiogram    . Myalgia   . PAD (peripheral artery disease) ([place])   . PANCREATITIS, CHRONIC 03/29/2006  . Peripheral arterial disease ([place])   . Pneumonia    age [age]  . RENAL INSUFFICIENCY, CHRONIC 03/15/2006  . Tonsillar mass    Left    Past Surgical History:  Procedure Laterality Date  . BREAST SURGERY    . COLONOSCOPY    . DILATION AND CURETTAGE OF UTERUS N/A 06/16/2016   Procedure: DILATATION AND CURETTAGE;  Surgeon: Mora Bellman, [name];  Location: [facility];  Service: Gynecology;  Laterality: N/A;  . HYSTEROSCOPY N/A 06/16/2016   Procedure: HYSTEROSCOPY;  Surgeon: Mora Bellman, [name];  Location: [facility];  Service: Gynecology;  Laterality: N/A;  .  MOUTH SURGERY N/A   . POLYPECTOMY N/A 06/16/2016   Procedure: POLYPECTOMY;  Surgeon: Mora Bellman, [name];  Location: [facility];  Service: Gynecology;  Laterality: N/A;    Family History  Problem Relation Age of Onset  . Dementia Mother   . Colon cancer Neg Hx     Social History   Socioeconomic History  . Marital status: Single    Spouse name: Not on file  . Number of children: 3  . Years of education: Not on file  . Highest education level: Not on file  Occupational History  . Occupation: retired- disabled  Social Needs  . Financial resource  strain: Not on file  . Food insecurity:    Worry: Not on file    Inability: Not on file  . Transportation needs:    Medical: Not on file    Non-medical: Not on file  Tobacco Use  . Smoking status: Current Every Day Smoker    Packs/day: 0.30    Years: 44.00    Pack years: 13.20    Types: Cigarettes    Last attempt to quit: 01/2017    Years since quitting: 1.1  . Smokeless tobacco: Never Used  . Tobacco comment: started smoking in Nov 2019 few cigarettes a day  Substance and Sexual Activity  . Alcohol use: Yes    Alcohol/week: 0.0 standard drinks    Comment: occ  . Drug use: No  . Sexual activity: Not on file  Lifestyle  . Physical activity:    Days per week: Not on file    Minutes per session: Not on file  . Stress: Not on file  Relationships  . Social connections:    Talks on phone: Not on file    Gets together: Not on file    Attends religious service: Not on file    Active member of club or organization: Not on file    Attends meetings of clubs or organizations: Not on file    Relationship status: Not on file  . Intimate partner violence:    Fear of current or ex partner: Not on file    Emotionally abused: Not on file    Physically abused: Not on file    Forced sexual activity: Not on file  Other Topics Concern  . Not on file  Social History Narrative  . Not on file   Current Meds  Medication Sig  . ACCU-CHEK AVIVA PLUS test strip TEST three times a day  . amLODipine (NORVASC) 10 MG tablet Take 10 mg by mouth daily.  Marland Kitchen aspirin EC 81 MG tablet Take 1 tablet (81 mg total) by mouth daily.  . Blood Glucose Monitoring Suppl (ACCU-CHEK AVIVA PLUS) w/Device KIT Use to check you blood sugar 3 times a day  . cyclobenzaprine (FLEXERIL) 10 MG tablet Take 1 tablet (10 mg total) by mouth 3 (three) times daily as needed for muscle spasms.  . diclofenac sodium (VOLTAREN) 1 % GEL Apply 4 g topically 3 (three) times daily as needed. (Patient taking differently: Apply 4 g  topically 3 (three) times daily as needed (for pain). )  . LANTUS SOLOSTAR 100 UNIT/ML Solostar Pen Inject 20 Units into the skin daily after breakfast.   . LINZESS 145 MCG CAPS capsule Take 145 mcg by mouth daily.   . Multiple Vitamins-Minerals (ONE-A-DAY WOMENS 50+ ADVANTAGE) TABS Take 1 tablet by mouth daily with breakfast.  . NONFORMULARY OR COMPOUNDED ITEM Apply 1-2 g topically 4 (four) times daily. [place]  Peripheral  Neuropathy Cream- Bupivacaine 1%, Doxepin 3%, Gabapentin 6%, Pentoxifylline 3%, Topiramate 1% Apply 1-2 grams to affected area 3-4 times daily Qty. 120 gm 3 refills  . Pancrelipase, Lip-Prot-Amyl, 24000-76000 units CPEP Take 4 capsules (96,000 Units total) by mouth See admin instructions. Take 4 capsules by mouth 2-3 times daily before meals (Patient taking differently: Take 2-4 capsules by mouth See admin instructions. Take 4 capsules by mouth two to three times a day with meals and 2 capsules with any snacks)  . PROAIR HFA 108 (90 Base) MCG/ACT inhaler Inhale 2 puffs into the lungs 4 (four) times daily as needed for wheezing or shortness of breath.  . ranitidine (ZANTAC) 150 MG tablet Take 1 tablet (150 mg total) by mouth daily.  . rosuvastatin (CRESTOR) 20 MG tablet TAKE 1 TABLET BY MOUTH EVERY DAY  . sitaGLIPtin (JANUVIA) 50 MG tablet Take 1 tablet (50 mg total) by mouth daily.      Review of Systems  Constitution: Negative for decreased appetite, malaise/fatigue, weight gain and weight loss.  Eyes: Negative for visual disturbance.  Cardiovascular: Positive for claudication. Negative for chest pain, dyspnea on exertion, leg swelling, orthopnea, palpitations and syncope.  Respiratory: Negative for hemoptysis and wheezing.   Endocrine: Negative for cold intolerance and heat intolerance.  Hematologic/Lymphatic: Does not bruise/bleed easily.  Skin: Positive for poor wound healing. Negative for nail changes.  Musculoskeletal: Positive for back pain and joint  pain (right foot). Negative for muscle weakness and myalgias.  Gastrointestinal: Negative for abdominal pain, change in bowel habit, nausea and vomiting.  Neurological: Negative for difficulty with concentration, dizziness, focal weakness and headaches.  Psychiatric/Behavioral: Negative for altered mental status and suicidal ideas.  All other systems reviewed and are negative.      Objective:     Today's Vitals   04/06/18 0953  BP: (!) 114/55  Pulse: 77  SpO2: 97%  Weight: 136 lb 3.2 oz (61.8 kg)  Height: 5' 5" (1.651 m)   Body mass index is 22.66 kg/m.   Echocardiogram 09/21/2017: Left ventricle cavity is normal in size. Severe concentric hypertrophy of the left ventricle, both septal and posterior wall measuring 1.7 cm in AP dimension. Normal global wall motion. Grade II diastolic dysfunction. Elevated LAP. Calculated EF 59%. Left atrial cavity is mildly dilated. Mild aortic valve leaflet thickening. Moderate (Grade III) aortic regurgitation. Moderate (Grade II) mitral regurgitation. Moderate tricuspid regurgitation. Estimated pulmonary artery systolic pressure 31 mmHg.  Lexiscan myoview stress test 09/06/2017: 1. Lexiscan stress test was performed. Exercise capacity was not assessed. No stress symptoms reported. Peak blood pressure was 148/88 mmHg. The resting and stress electrocardiogram demonstrated normal sinus rhythm, normal resting conduction, no resting arrhythmias and normal rest repolarization. Possible old anteroseptal infarct. Stress EKG is non diagnostic for ischemia as it is a pharmacologic stress. 2. The overall quality of the study is excellent. There is no evidence of abnormal lung activity. Stress and rest SPECT images demonstrate homogeneous tracer distribution throughout the myocardium. Gated SPECT imaging reveals normal myocardial thickening and wall motion. The left ventricular ejection fraction was normal (74%). 3. Low risk study.  ABI  09/22/2017: Moderately abnormal waveforms of the right ankle. Severely abnormal waveforms of the left ankle. Mildly decreased right resting ABI at o.96 and moderately decreased left resting ABI at 0.59.  Carotid artery duplex 09/21/2017: Minimal stenosis in the right internal carotid artery (1-15%) with heterogeneous plaque Antegrade right vertebral artery flow. Antegrade left vertebral artery flow.  Labs 07/30/2017: H/H 9.8/29.2.  MCV 80.  Platelets 145 Glucose 304.  BUN/creatinine of 4/1.36.  EGFR 41/47.  Sodium 134, potassium 4.5 Cholesterol 88, HDL 52, triglycerides 60, LDL 22 TSH 0.98 normal  Physical Exam  Constitutional: She is oriented to person, place, and time. Vital signs are normal. She appears well-developed and well-nourished.  HENT:  Head: Normocephalic and atraumatic.  Neck: Normal range of motion. Carotid bruit is present.  Cardiovascular: Normal rate, regular rhythm and intact distal pulses.  Murmur heard.  Harsh midsystolic murmur is present with a grade of 2/6 at the upper right sternal border radiating to the neck. Pulses:      Carotid pulses are on the right side with bruit and on the left side with bruit.      Femoral pulses are 2+ on the right side and 1+ on the left side with bruit.      Popliteal pulses are 0 on the right side and 0 on the left side.       Dorsalis pedis pulses are 2+ on the right side and 2+ on the left side.       Posterior tibial pulses are 0 on the right side and 0 on the left side.  Pulmonary/Chest: Effort normal and breath sounds normal. No accessory muscle usage. No respiratory distress.  Abdominal: Soft. Bowel sounds are normal.  Musculoskeletal: Normal range of motion.  Neurological: She is alert and oriented to person, place, and time.  Skin: Skin is warm and dry.  Right mid foot ulceration covered by dsg. Callus bilateral with thick rigid nails  Vitals reviewed.       Assessment & Recommendations:   1. Peripheral arterial  disease ([place]) Patient had abnormal ABI performed in July 2019, in view of lack of symptoms, medical therapy was recommended.  She is now reporting claudication symptoms in her thigh claudication.  Continues to have nonhealing ulceration on her right foot.  I recommend further evaluation with lower extremity arterial duplex and if markedly abnormal with consider PVD angiogram for management.  Continue with aspirin and statin therapy.  - EKG 12-Lead - PCV LOWER ARTERIAL (BILATERAL); Future  2. Midfoot ulceration, right, with unspecified severity ([place]) She continues to follow with Dr. March Rummage for management of this.  Unable to visualize ulcer today in view of dressing that is in place.  Suspect nonhealing ulceration related to uncontrolled diabetes and also PAD.  As stated above, if lower extremity arterial duplex is abnormal, could consider PV angiogram for further evaluation.  - PCV LOWER ARTERIAL (BILATERAL); Future  3. Uncontrolled type 2 diabetes mellitus with chronic kidney disease, without long-term current use of insulin ([place]) Diabetes continues to be uncontrolled.  Being managed by her PCP.  Discussed diet modifications in controlling her diabetes.  4. Former tobacco use Previous history of heavy tobacco use and has essentially remained asymptomatic for the last one year except for admits to a few cigarettes in 2023-02-17 after her sister died.  She has not had any cigarettes since 2023/02/17.  Encouraged continued smoking cessation.  Suspect etiology for her PAD.  5. Essential hypertension Well-controlled with current medications.  Continue the same.  6. Chronic pancreatitis, unspecified pancreatitis type ([place]) Has not had any recurrence of abdominal pain since being discharged from the hospital.  She is to see GI next week for further evaluation and management.   I will have her come back to see [place] after her lower extremity arterial duplex.   Jeri Lager, FNP-C [facility], [place] Office: [phone] Fax: [phone]

## 2018-04-06 ENCOUNTER — Ambulatory Visit (INDEPENDENT_AMBULATORY_CARE_PROVIDER_SITE_OTHER): Payer: Medicare Other | Admitting: Cardiology

## 2018-04-06 ENCOUNTER — Encounter: Payer: Self-pay | Admitting: Cardiology

## 2018-04-06 VITALS — BP 114/55 | HR 77 | Ht 65.0 in | Wt 136.2 lb

## 2018-04-06 DIAGNOSIS — IMO0002 Reserved for concepts with insufficient information to code with codable children: Secondary | ICD-10-CM

## 2018-04-06 DIAGNOSIS — I10 Essential (primary) hypertension: Secondary | ICD-10-CM | POA: Diagnosis not present

## 2018-04-06 DIAGNOSIS — K861 Other chronic pancreatitis: Secondary | ICD-10-CM

## 2018-04-06 DIAGNOSIS — I739 Peripheral vascular disease, unspecified: Secondary | ICD-10-CM

## 2018-04-06 DIAGNOSIS — Z87891 Personal history of nicotine dependence: Secondary | ICD-10-CM | POA: Diagnosis not present

## 2018-04-06 DIAGNOSIS — E1122 Type 2 diabetes mellitus with diabetic chronic kidney disease: Secondary | ICD-10-CM | POA: Diagnosis not present

## 2018-04-06 DIAGNOSIS — E1165 Type 2 diabetes mellitus with hyperglycemia: Secondary | ICD-10-CM

## 2018-04-06 DIAGNOSIS — L97419 Non-pressure chronic ulcer of right heel and midfoot with unspecified severity: Secondary | ICD-10-CM | POA: Diagnosis not present

## 2018-04-08 ENCOUNTER — Ambulatory Visit (INDEPENDENT_AMBULATORY_CARE_PROVIDER_SITE_OTHER): Payer: Medicare Other | Admitting: Podiatry

## 2018-04-08 DIAGNOSIS — E08621 Diabetes mellitus due to underlying condition with foot ulcer: Secondary | ICD-10-CM

## 2018-04-08 DIAGNOSIS — L97411 Non-pressure chronic ulcer of right heel and midfoot limited to breakdown of skin: Secondary | ICD-10-CM | POA: Diagnosis not present

## 2018-04-12 ENCOUNTER — Ambulatory Visit: Payer: Medicare Other | Admitting: Physician Assistant

## 2018-04-21 ENCOUNTER — Telehealth: Payer: Self-pay | Admitting: Internal Medicine

## 2018-04-21 NOTE — Telephone Encounter (Signed)
Pt reported that she has abd p and requested prescription for sucralfate.  Pt is sched for OV with [facility] May 03, 2018.

## 2018-04-22 ENCOUNTER — Other Ambulatory Visit: Payer: Self-pay | Admitting: Internal Medicine

## 2018-04-22 ENCOUNTER — Ambulatory Visit: Payer: Medicare Other

## 2018-04-22 ENCOUNTER — Other Ambulatory Visit: Payer: Self-pay

## 2018-04-22 DIAGNOSIS — I739 Peripheral vascular disease, unspecified: Secondary | ICD-10-CM | POA: Diagnosis not present

## 2018-04-22 DIAGNOSIS — L97419 Non-pressure chronic ulcer of right heel and midfoot with unspecified severity: Secondary | ICD-10-CM | POA: Diagnosis not present

## 2018-04-22 MED ORDER — SUCRALFATE 1 G PO TABS: 1.0000 g | ORAL_TABLET | Freq: Three times a day (TID) | ORAL | 0 refills | Status: AC

## 2018-04-22 NOTE — Telephone Encounter (Signed)
Refill sent to pharmacy.   

## 2018-04-22 NOTE — Telephone Encounter (Signed)
Patient is scheduled with [name] 05/03/18. She is apparently reporting abdominal pain and has hx of pancreatitis. I think this is a triage.

## 2018-04-22 NOTE — Telephone Encounter (Signed)
[name] is out of the office, pt has an appt with Anderson Malta coming up. Pt was prescribed carafate in the ER. Please advise if ok to refill carafate as DOD.

## 2018-04-22 NOTE — Telephone Encounter (Signed)
Pt is following back on the previous msg.

## 2018-04-22 NOTE — Telephone Encounter (Signed)
Okay for a limited refill of sucralfate 1 g 3 times daily before meals and at bedtime until follow-up appointment with APP She has a hx of acute on chronic pancreatitis and sucralfate would not help this condition, but ok to refill until followup if it had been helpful

## 2018-04-22 NOTE — Telephone Encounter (Signed)
[name] this pt has not seen Anderson Malta yet.  She last saw Dr Henrene Pastor Nov 19.

## 2018-04-23 NOTE — Progress Notes (Signed)
Subjective:  Patient ID: [name], female    DOB: [birth year],  MRN: [id number]  Chief Complaint  Patient presents with  . Wound Check    Pt states right plantar ulcer sub 5th is the same, not much drainage, no new concerns. Pt denies fever/nausea/vomiting/chills. Pt states wears boot at home but not outside because it is slippery.     68 y.o. female presents with the above complaint.   Review of Systems: Negative except as noted in the HPI. Denies N/V/F/Ch.  Past Medical History:  Diagnosis Date  . ANEMIA, CHRONIC DISEASE NEC 03/15/2006  . Carotid artery occlusion   . Cirrhosis ([place])   . Colon polyps   . Constipation due to opioid therapy 06/05/2014  . DEGENERATIVE JOINT DISEASE 05/06/2007   On going for >5 yrs No imaging to confirm Has tried ultram, mobic, neurontin which did not work Ambulance person tried once worked well    . Diabetes mellitus    Type II  . DIABETIC  RETINOPATHY 03/15/2006  . 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 Diastolic dysfunction 52/77/8242   Grade 1 by Echocardiogram 12/13/14  . Elevated liver enzymes   . Heart murmur   . Hemorrhoids   . History of kidney stones   . Hyperlipidemia 04/01/2011  . Hypertension   . Mild AI (aortic insufficiency)   . Moderate aortic regurgitation 09/25/2008   12/13/14 Echocardiogram    . Myalgia   . PAD (peripheral artery disease) ([place])   . PANCREATITIS, CHRONIC 03/29/2006  . Peripheral arterial disease ([place])   . Pneumonia    age 34-20  . RENAL INSUFFICIENCY, CHRONIC 03/15/2006  . Tonsillar mass    Left    Current Outpatient Medications:  .  ACCU-CHEK AVIVA PLUS test strip, TEST three times a day, Disp: 100 each, Rfl: 11 .  amLODipine (NORVASC) 10 MG tablet, Take 10 mg by mouth daily., Disp: , Rfl:  .  aspirin EC 81 MG tablet, Take 1 tablet (81 mg total) by mouth daily., Disp: , Rfl:  .  Blood Glucose Monitoring Suppl (ACCU-CHEK AVIVA PLUS) w/Device KIT, Use to check you blood sugar 3 times a day, Disp: 1 kit, Rfl:  1 .  cyclobenzaprine (FLEXERIL) 10 MG tablet, Take 1 tablet (10 mg total) by mouth 3 (three) times daily as needed for muscle spasms., Disp: 12 tablet, Rfl: 0 .  diclofenac sodium (VOLTAREN) 1 % GEL, Apply 4 g topically 3 (three) times daily as needed. (Patient taking differently: Apply 4 g topically 3 (three) times daily as needed (for pain). ), Disp: 100 g, Rfl: 0 .  LANTUS SOLOSTAR 100 UNIT/ML Solostar Pen, Inject 20 Units into the skin daily after breakfast. , Disp: , Rfl: 5 .  LINZESS 145 MCG CAPS capsule, Take 145 mcg by mouth daily. , Disp: , Rfl:  .  Multiple Vitamins-Minerals (ONE-A-DAY WOMENS 50+ ADVANTAGE) TABS, Take 1 tablet by mouth daily with breakfast., Disp: , Rfl:  .  NONFORMULARY OR COMPOUNDED ITEM, Apply 1-2 g topically 4 (four) times daily. [facility]  Peripheral Neuropathy Cream- Bupivacaine 1%, Doxepin 3%, Gabapentin 6%, Pentoxifylline 3%, Topiramate 1% Apply 1-2 grams to affected area 3-4 times daily Qty. 120 gm 3 refills, Disp: 120 each, Rfl: 2 .  Pancrelipase, Lip-Prot-Amyl, 24000-76000 units CPEP, Take 4 capsules (96,000 Units total) by mouth See admin instructions. Take 4 capsules by mouth 2-3 times daily before meals (Patient taking differently: Take 2-4 capsules by mouth See admin instructions. Take 4 capsules by mouth two to three  times a day with meals and 2 capsules with any snacks), Disp: 1170 capsule, Rfl: 3 .  PROAIR HFA 108 (90 Base) MCG/ACT inhaler, Inhale 2 puffs into the lungs 4 (four) times daily as needed for wheezing or shortness of breath., Disp: , Rfl: 3 .  ranitidine (ZANTAC) 150 MG tablet, Take 1 tablet (150 mg total) by mouth daily., Disp: 90 tablet, Rfl: 3 .  rosuvastatin (CRESTOR) 20 MG tablet, TAKE 1 TABLET BY MOUTH [name] DAY, Disp: 90 tablet, Rfl: 3 .  sitaGLIPtin (JANUVIA) 50 MG tablet, Take 1 tablet (50 mg total) by mouth daily., Disp: 12 tablet, Rfl: 0 .  sucralfate (CARAFATE) 1 g tablet, Take 1 tablet (1 g total) by mouth 4 (four) times daily  -  with meals and at bedtime., Disp: 20 tablet, Rfl: 0 .  sucralfate (CARAFATE) 1 g tablet, Take 1 tablet (1 g total) by mouth 3 (three) times daily after meals., Disp: 60 tablet, Rfl: 0  Social History   Tobacco Use  Smoking Status Former Smoker  . Packs/day: 0.30  . Years: 44.00  . Pack years: 13.20  . Types: Cigarettes  . Last attempt to quit: 03/2017  . Years since quitting: 1.0  Smokeless Tobacco Never Used  Tobacco Comment   started smoking in Nov 2019 few cigarettes a day    Allergies  Allergen Reactions  . Atorvastatin Other (See Comments)    Caused muscles in her back to be very sore  . Gabapentin Swelling    Legs and feet swell  . Nicorette [Nicotine] Nausea Only   Objective:  There were no vitals filed for this visit. There is no height or weight on file to calculate BMI. Constitutional Well developed. Well nourished.  Vascular Dorsalis pedis pulses palpable bilaterally. Posterior tibial pulses non-palpable bilaterally. Capillary refill normal to all digits.  No cyanosis or clubbing noted. Pedal hair growth normal.  Neurologic Normal speech. Oriented to person, place, and time. Epicritic sensation to light touch grossly present bilaterally.  Dermatologic Nails well groomed and normal in appearance. No skin lesions. Would R 5th MPJ 1x1.5 post-debridement Punctate keratoses sub-met 1 5 heel left, first MPJ right  Orthopedic: Normal joint ROM without pain or crepitus bilaterally. No visible deformities. Pain on palpation right fifth MPJ   Radiographs: None today Assessment:   1. Diabetic ulcer of right midfoot associated with diabetes mellitus due to underlying condition, limited to breakdown of skin [facility])    Plan:  Patient was evaluated and treated and all questions answered.  Ulcer R 5th MPJ -Still pending surgery. -Ulcer debrided as below -Continue offloading boot.  Procedure: Selective Debridement of Wound Rationale: Removal of devitalized  tissue from the wound to promote healing.  Pre-Debridement Wound Measurements: 1 cm x 1.5 cm x 0.1 cm  Post-Debridement Wound Measurements: same as pre-debridement. Type of Debridement: sharp selective Tissue Removed: Devitalized soft-tissue Dressing: Dry, sterile, compression dressing. Disposition: Patient tolerated procedure well. Patient to return in 1 week for follow-up.       No follow-ups on file.

## 2018-04-26 ENCOUNTER — Ambulatory Visit: Payer: Medicare Other | Admitting: Cardiology

## 2018-04-30 ENCOUNTER — Other Ambulatory Visit: Payer: Self-pay

## 2018-04-30 ENCOUNTER — Emergency Department (HOSPITAL_COMMUNITY)
Admission: EM | Admit: 2018-04-30 | Discharge: 2018-05-01 | Disposition: A | Discharge status: Home / Self Care | Payer: Medicare Other | Attending: Emergency Medicine | Admitting: Emergency Medicine

## 2018-04-30 ENCOUNTER — Encounter (HOSPITAL_COMMUNITY): Payer: Self-pay | Admitting: Emergency Medicine

## 2018-04-30 DIAGNOSIS — Z79899 Other long term (current) drug therapy: Secondary | ICD-10-CM | POA: Insufficient documentation

## 2018-04-30 DIAGNOSIS — E1122 Type 2 diabetes mellitus with diabetic chronic kidney disease: Secondary | ICD-10-CM | POA: Diagnosis not present

## 2018-04-30 DIAGNOSIS — K860 Alcohol-induced chronic pancreatitis: Secondary | ICD-10-CM | POA: Diagnosis not present

## 2018-04-30 DIAGNOSIS — R1033 Periumbilical pain: Secondary | ICD-10-CM | POA: Diagnosis present

## 2018-04-30 DIAGNOSIS — Z794 Long term (current) use of insulin: Secondary | ICD-10-CM | POA: Diagnosis not present

## 2018-04-30 DIAGNOSIS — N289 Disorder of kidney and ureter, unspecified: Secondary | ICD-10-CM | POA: Diagnosis not present

## 2018-04-30 DIAGNOSIS — D649 Anemia, unspecified: Secondary | ICD-10-CM | POA: Diagnosis not present

## 2018-04-30 DIAGNOSIS — N183 Chronic kidney disease, stage 3 (moderate): Secondary | ICD-10-CM | POA: Diagnosis not present

## 2018-04-30 DIAGNOSIS — I129 Hypertensive chronic kidney disease with stage 1 through stage 4 chronic kidney disease, or unspecified chronic kidney disease: Secondary | ICD-10-CM | POA: Diagnosis not present

## 2018-04-30 DIAGNOSIS — Z87891 Personal history of nicotine dependence: Secondary | ICD-10-CM | POA: Insufficient documentation

## 2018-04-30 LAB — URINALYSIS, ROUTINE W REFLEX MICROSCOPIC
BILIRUBIN URINE: NEGATIVE
Bacteria, UA: NONE SEEN
Glucose, UA: NEGATIVE mg/dL
Hgb urine dipstick: NEGATIVE
Ketones, ur: NEGATIVE mg/dL
Nitrite: NEGATIVE
Protein, ur: 100 mg/dL — AB
Specific Gravity, Urine: 1.015 (ref 1.005–1.030)
pH: 5 (ref 5.0–8.0)

## 2018-04-30 LAB — COMPREHENSIVE METABOLIC PANEL
ALT: 61 U/L — ABNORMAL HIGH (ref 0–44)
ANION GAP: 5 (ref 5–15)
AST: 43 U/L — ABNORMAL HIGH (ref 15–41)
Albumin: 2.8 g/dL — ABNORMAL LOW (ref 3.5–5.0)
Alkaline Phosphatase: 105 U/L (ref 38–126)
BUN: 18 mg/dL (ref 8–23)
CO2: 17 mmol/L — ABNORMAL LOW (ref 22–32)
Calcium: 8.3 mg/dL — ABNORMAL LOW (ref 8.9–10.3)
Chloride: 106 mmol/L (ref 98–111)
Creatinine, Ser: 1.43 mg/dL — ABNORMAL HIGH (ref 0.44–1.00)
GFR calc non Af Amer: 38 mL/min — ABNORMAL LOW (ref >60)
GFR, EST AFRICAN AMERICAN: 44 mL/min — AB (ref >60)
Glucose, Bld: 212 mg/dL — ABNORMAL HIGH (ref 70–99)
POTASSIUM: 3.9 mmol/L (ref 3.5–5.1)
Sodium: 128 mmol/L — ABNORMAL LOW (ref 135–145)
Total Bilirubin: 0.4 mg/dL (ref 0.3–1.2)
Total Protein: 7.5 g/dL (ref 6.5–8.1)

## 2018-04-30 LAB — CBC
HCT: 30.5 % — ABNORMAL LOW (ref 36.0–46.0)
Hemoglobin: 9.4 g/dL — ABNORMAL LOW (ref 12.0–15.0)
MCH: 27.1 pg (ref 26.0–34.0)
MCHC: 30.8 g/dL (ref 30.0–36.0)
MCV: 87.9 fL (ref 80.0–100.0)
NRBC: 0 % (ref 0.0–0.2)
Platelets: 163 10*3/uL (ref 150–400)
RBC: 3.47 MIL/uL — AB (ref 3.87–5.11)
RDW: 15 % (ref 11.5–15.5)
WBC: 4.5 10*3/uL (ref 4.0–10.5)

## 2018-04-30 LAB — LIPASE, BLOOD: Lipase: 17 U/L (ref 11–51)

## 2018-04-30 MED ORDER — SODIUM CHLORIDE 0.9% FLUSH: 3.0000 mL | Freq: Once | INTRAVENOUS | Status: DC

## 2018-04-30 NOTE — ED Triage Notes (Signed)
Pt c/o abdominal pain after eating a banana. Denies nausea/vomiting/diarrhea.

## 2018-05-01 DIAGNOSIS — K860 Alcohol-induced chronic pancreatitis: Secondary | ICD-10-CM | POA: Diagnosis not present

## 2018-05-01 MED ORDER — ONDANSETRON HCL 4 MG/2ML IJ SOLN
4.0000 mg | Freq: Once | INTRAMUSCULAR | Status: AC
  Administered 2018-05-01: 4 mg via INTRAVENOUS
  Filled 2018-05-01: qty 2

## 2018-05-01 MED ORDER — MORPHINE SULFATE (PF) 4 MG/ML IV SOLN
4.0000 mg | Freq: Once | INTRAVENOUS | Status: AC
  Administered 2018-05-01: 4 mg via INTRAVENOUS
  Filled 2018-05-01: qty 1

## 2018-05-01 MED ORDER — SODIUM CHLORIDE 0.9 % IV BOLUS
500.0000 mL | Freq: Once | INTRAVENOUS | Status: AC
  Administered 2018-05-01: 500 mL via INTRAVENOUS

## 2018-05-01 MED ORDER — ONDANSETRON HCL 4 MG PO TABS: 4.0000 mg | ORAL_TABLET | Freq: Four times a day (QID) | ORAL | 0 refills | Status: DC | PRN

## 2018-05-01 MED ORDER — HYDROCODONE-ACETAMINOPHEN 5-325 MG PO TABS: 1.0000 | ORAL_TABLET | ORAL | 0 refills | Status: DC | PRN

## 2018-05-01 NOTE — ED Notes (Signed)
PT states understanding of care given, follow up care, and medication prescribed. PT ambulated from ED to car with a steady gait. 

## 2018-05-01 NOTE — ED Provider Notes (Signed)
[facility] EMERGENCY DEPARTMENT Provider Note   CSN: [id number] Arrival date & time: 04/30/18  1918    History   Chief Complaint Chief Complaint  Patient presents with  . Abdominal Pain    HPI [name] is a 68 y.o. female.   The history is provided by the patient.  Abdominal Pain  She has history of hypertension, diabetes, hyperlipidemia, cirrhosis, chronic pancreatitis, peripheral arterial disease, chronic kidney disease and comes in because of mid abdominal pain which is typical of her pancreatitis.  Pain started this evening and radiates around to both flanks.  She rates pain a 10/10.  There is no nausea or vomiting.  She denies fever or chills.  She denies any recent ethanol intake.  Pain started after eating a banana.  Past Medical History:  Diagnosis Date  . ANEMIA, CHRONIC DISEASE NEC 03/15/2006  . Carotid artery occlusion   . Cirrhosis ([place])   . Colon polyps   . Constipation due to opioid therapy 06/05/2014  . DEGENERATIVE JOINT DISEASE 05/06/2007   On going for >5 yrs No imaging to confirm Has tried ultram, mobic, neurontin which did not work Ambulance person tried once worked well    . Diabetes mellitus    Type II  . DIABETIC  RETINOPATHY 03/15/2006  . 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 Diastolic dysfunction 69/48/5462   Grade 1 by Echocardiogram 12/13/14  . Elevated liver enzymes   . Heart murmur   . Hemorrhoids   . History of kidney stones   . Hyperlipidemia 04/01/2011  . Hypertension   . Mild AI (aortic insufficiency)   . Moderate aortic regurgitation 09/25/2008   12/13/14 Echocardiogram    . Myalgia   . PAD (peripheral artery disease) ([place])   . PANCREATITIS, CHRONIC 03/29/2006  . Peripheral arterial disease ([place])   . Pneumonia    age [age]  . RENAL INSUFFICIENCY, CHRONIC 03/15/2006  . Tonsillar mass    Left    Patient Active Problem List   Diagnosis Date Noted  . Midfoot ulceration, right, with unspecified severity ([place])  04/05/2018  . Acute pancreatitis with uninfected necrosis 04/01/2018  . Constipation 04/01/2018  . Recurrent epistaxis 05/31/2017  . Throat pain in adult 05/31/2017  . Tubular adenoma of colon 06/30/2016  . Diverticulosis of colon without hemorrhage 06/30/2016  . Postmenopausal vaginal bleeding   . Long term (current) use of opiate analgesic 04/09/2016  . Bilateral carotid artery disease ([place]) 11/28/2015  . Pancreatic insufficiency 05/09/2015  . 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 Risk for coronary artery disease greater than 20% in next 10 years 03/14/2015  . Diastolic dysfunction 70/35/0093  . Vulvar cyst 01/23/2015  . Abnormal uterine bleeding 12/02/2014  . Unexplained night sweats 12/02/2014  . Constipation due to opioid therapy 06/05/2014  . Loss of weight 03/29/2014  . Peripheral arterial disease ([place]) 12/12/2013  . GERD (gastroesophageal reflux disease) 10/12/2012  . Healthcare maintenance 09/29/2012  . Back pain 03/28/2012  . Hyperlipidemia 04/01/2011  . BOILS, RECURRENT 03/13/2010  . Moderate aortic regurgitation 09/25/2008  . HEMORRHOIDS 06/08/2008  . Osteoarthritis 05/06/2007  . Former tobacco use 09/27/2006  . History of osteoporosis 06/29/2006  . Chronic pancreatitis ([place]) 03/29/2006  . Uncontrolled type 2 diabetes mellitus with chronic kidney disease, without long-term current use of insulin ([place]) 03/15/2006  . Diabetic neuropathy, painful ([place]) 03/15/2006  . Anemia of chronic disease 03/15/2006  . Essential hypertension 03/15/2006  . VENTRICULAR HYPERTROPHY, LEFT 03/15/2006  . CKD stage 3 due  to type 2 diabetes mellitus ([place]) 03/15/2006    Past Surgical History:  Procedure Laterality Date  . BREAST SURGERY    . COLONOSCOPY    . DILATION AND CURETTAGE OF UTERUS N/A 06/16/2016   Procedure: DILATATION AND CURETTAGE;  Surgeon: Mora Bellman, MD;  Location: [facility];  Service: Gynecology;  Laterality: N/A;  . HYSTEROSCOPY N/A 06/16/2016   Procedure:  HYSTEROSCOPY;  Surgeon: Mora Bellman, MD;  Location: [facility];  Service: Gynecology;  Laterality: N/A;  . MOUTH SURGERY N/A   . POLYPECTOMY N/A 06/16/2016   Procedure: POLYPECTOMY;  Surgeon: Mora Bellman, MD;  Location: [facility];  Service: Gynecology;  Laterality: N/A;     OB History    Gravida  3   Para  3   Term  3   Preterm      AB      Living  3     SAB      TAB      Ectopic      Multiple      Live Births  3            Home Medications    Prior to Admission medications   Medication Sig Start Date End Date Taking? Authorizing Provider  ACCU-CHEK AVIVA PLUS test strip TEST three times a day 11/25/15   Lucious Groves, DO  amLODipine (NORVASC) 10 MG tablet Take 10 mg by mouth daily. 06/01/17   [provider]  aspirin EC 81 MG tablet Take 1 tablet (81 mg total) by mouth daily. 04/09/16   Lucious Groves, DO  Blood Glucose Monitoring Suppl (ACCU-CHEK AVIVA PLUS) w/Device KIT Use to check you blood sugar 3 times a day 06/04/15   Oval Linsey, MD  cyclobenzaprine (FLEXERIL) 10 MG tablet Take 1 tablet (10 mg total) by mouth 3 (three) times daily as needed for muscle spasms. 02/27/18   Virgel Manifold, MD  diclofenac sodium (VOLTAREN) 1 % GEL Apply 4 g topically 3 (three) times daily as needed. Patient taking differently: Apply 4 g topically 3 (three) times daily as needed (for pain).  03/13/18   Duffy Bruce, MD  LANTUS SOLOSTAR 100 UNIT/ML Solostar Pen Inject 20 Units into the skin daily after breakfast.  05/04/17   [provider]  LINZESS 145 MCG CAPS capsule Take 145 mcg by mouth daily.  03/07/17   [provider]  Multiple Vitamins-Minerals (ONE-A-DAY WOMENS 50+ ADVANTAGE) TABS Take 1 tablet by mouth daily with breakfast.    [provider]  NONFORMULARY OR COMPOUNDED ITEM Apply 1-2 g topically 4 (four) times daily. [facility]  Peripheral Neuropathy Cream- Bupivacaine 1%, Doxepin 3%, Gabapentin 6%, Pentoxifylline 3%,  Topiramate 1% Apply 1-2 grams to affected area 3-4 times daily Qty. 120 gm 3 refills 12/03/17   Evelina Bucy, DPM  Pancrelipase, Lip-Prot-Amyl, 24000-76000 units CPEP Take 4 capsules (96,000 Units total) by mouth See admin instructions. Take 4 capsules by mouth 2-3 times daily before meals Patient taking differently: Take 2-4 capsules by mouth See admin instructions. Take 4 capsules by mouth two to three times a day with meals and 2 capsules with any snacks 03/10/16   Lucious Groves, DO  PROAIR HFA 108 [phone] Base) MCG/ACT inhaler Inhale 2 puffs into the lungs 4 (four) times daily as needed for wheezing or shortness of breath. 04/30/17   [provider]  ranitidine (ZANTAC) 150 MG tablet Take 1 tablet (150 mg total) by mouth daily. 01/18/18   Irene Shipper, MD  rosuvastatin (CRESTOR) 20 MG tablet TAKE 1 TABLET BY MOUTH EVERY DAY 04/04/18   Miquel Dunn, NP  sitaGLIPtin (JANUVIA) 50 MG tablet Take 1 tablet (50 mg total) by mouth daily. 09/10/16   Lucious Groves, DO  sucralfate (CARAFATE) 1 g tablet Take 1 tablet (1 g total) by mouth 4 (four) times daily -  with meals and at bedtime. 03/28/18   Malvin Johns, MD  sucralfate (CARAFATE) 1 g tablet Take 1 tablet (1 g total) by mouth 3 (three) times daily after meals. 04/22/18   Pyrtle, Lajuan Lines, MD    Family History Family History  Problem Relation Age of Onset  . Dementia Mother   . Colon cancer Neg Hx     Social History Social History   Tobacco Use  . Smoking status: Former Smoker    Packs/day: 0.30    Years: 44.00    Pack years: 13.20    Types: Cigarettes    Last attempt to quit: 03/2017    Years since quitting: 1.0  . Smokeless tobacco: Never Used  . Tobacco comment: started smoking in Nov 2019 few cigarettes a day  Substance Use Topics  . Alcohol use: Not Currently    Alcohol/week: 0.0 standard drinks  . Drug use: No     Allergies   Atorvastatin; Gabapentin; and Nicorette [nicotine]   Review of Systems Review  of Systems  Gastrointestinal: Positive for abdominal pain.  All other systems reviewed and are negative.    Physical Exam Updated Vital Signs BP (!) 139/57 (BP Location: Left Arm)   Pulse 93   Temp 99.4 F (37.4 C) (Oral)   Resp 20   LMP 02/22/2016   SpO2 100%   Physical Exam Vitals signs and nursing note reviewed.    68 year old female, resting comfortably and in no acute distress. Vital signs are normal. Oxygen saturation is 100%, which is normal. Head is normocephalic and atraumatic. PERRLA, EOMI. Oropharynx is clear. Neck is nontender and supple without adenopathy or JVD. Back is nontender and there is no CVA tenderness. Lungs are clear without rales, wheezes, or rhonchi. Chest is nontender. Heart has regular rate and rhythm without murmur. Abdomen is soft, flat, with mild periumbilical tenderness. There is no rebound or guarding. There are no masses or hepatosplenomegaly and peristalsis is slightly hypoactive. Extremities have no cyanosis or edema, full range of motion is present. Skin is warm and dry without rash. Neurologic: Mental status is normal, cranial nerves are intact, there are no motor or sensory deficits.  ED Treatments / Results  Labs (all labs ordered are listed, but only abnormal results are displayed) Labs Reviewed  COMPREHENSIVE METABOLIC PANEL - Abnormal; Notable for the following components:      Result Value   Sodium 128 (*)    CO2 17 (*)    Glucose, Bld 212 (*)    Creatinine, Ser 1.43 (*)    Calcium 8.3 (*)    Albumin 2.8 (*)    AST 43 (*)    ALT 61 (*)    GFR calc non Af Amer 38 (*)    GFR calc Af Amer 44 (*)    All other components within normal limits  CBC - Abnormal; Notable for the following components:   RBC 3.47 (*)    Hemoglobin 9.4 (*)    HCT 30.5 (*)    All other components within normal limits  URINALYSIS, ROUTINE W REFLEX MICROSCOPIC - Abnormal; Notable for the following components:   Color,  Urine AMBER (*)    APPearance  HAZY (*)    Protein, ur 100 (*)    Leukocytes,Ua TRACE (*)    All other components within normal limits  LIPASE, BLOOD    EKG None  Radiology No results found.  Procedures Procedures  Medications Ordered in ED Medications  sodium chloride flush (NS) 0.9 % injection 3 mL (has no administration in time range)     Initial Impression / Assessment and Plan / ED Course  I have reviewed the triage vital signs and the nursing notes.  Pertinent labs & imaging results that were available during my care of the patient were reviewed by me and considered in my medical decision making (see chart for details).  Abdominal pain and patient with known history of chronic pancreatitis.  Labs are significant for mild hyponatremia, mild decrease in CO2 with normal anion gap, renal insufficiency which is not significantly changed from baseline moderate anemia which is unchanged from baseline.  Lipase is normal, but she has not had any documented elevated lipase levels in our system but did have clear CT findings of pancreatitis last month.  Will give IV fluids and analgesics.   After above-noted treatment, patient was sleeping comfortably.  I have reviewed her record on the [place] controlled substance reporting website, and she has had occasional prescriptions for short course of hydrocodone-acetaminophen-last prescription was in January.  She is discharged with prescription for hydrocodone-acetaminophen and ondansetron.  Return precautions discussed.  Final Clinical Impressions(s) / ED Diagnoses   Final diagnoses:  Alcohol-induced chronic pancreatitis ([place])  Renal insufficiency  Normochromic normocytic anemia    ED Discharge Orders         Ordered    ondansetron (ZOFRAN) 4 MG tablet  Every 6 hours PRN     05/01/18 0538    HYDROcodone-acetaminophen (NORCO) 5-325 MG tablet  Every 4 hours PRN     05/01/18 7341           Delora Fuel, MD 93/79/02 [phone]

## 2018-05-01 NOTE — ED Notes (Signed)
Patient ambulatory at discharge without assistance and with steady gait. Given sprite and graham crackers.

## 2018-05-02 ENCOUNTER — Telehealth: Payer: Self-pay | Admitting: Internal Medicine

## 2018-05-02 NOTE — Telephone Encounter (Signed)
Discussed with pt that she can take Imodium otc as the directions on the box state. Pt verbalized understanding.

## 2018-05-03 ENCOUNTER — Ambulatory Visit: Payer: Medicare Other | Admitting: Physician Assistant

## 2018-05-04 NOTE — Telephone Encounter (Signed)
Pt did not keep her appt yesterday, states the diarrhea was to bad and she couldn't keep the appt. Discussed with pt that we do not have any appts and suggested she contact her PCP, ER , or Urgent care. Pt vebalized understanding.

## 2018-05-04 NOTE — Telephone Encounter (Signed)
Pt is called saying that she is still taking the imodium and it has not done nothing to help.

## 2018-05-06 ENCOUNTER — Ambulatory Visit: Payer: Medicare Other | Admitting: Podiatry

## 2018-05-06 NOTE — Progress Notes (Deleted)
Subjective:   _0  ID@: [name], female    DOB: 09/24/1950, 68 y.o.   MRN: [id number]  Kiara Ebbs, MD:  No chief complaint on file.   HPI   Patient with type 2 diabetes, chronic pancreatitis, CKD stage III, COPD, carotid stenosis diagnosed in October 2018, hypertension, PAD by LE duplex in July 2019, and hyperlipidemia.  She was initially evaluated by [place] for preoperative risk stratification for right foot diabetic foot ulceration debridement with Dr. March Rummage, in which was performed on 03/24/2018. She was also scheduled for laryngoscopy for tonsillar nodule with Dr. Reginia Naas; however, patient did not show for her procedure. Lexiscan nuclear stress test in July 2019 was considered low risk study. Echo performed also at that time showed normal LVEF with moderate AR. Lower extremity arterial duplex in July 2019 showed severely abnormal waveforms and moderateley decreased ABI on the left at 0.5 and moderatley abnormal waveform on the right and mild decrease in ABI. In view of lack of symptoms, medical therapy was recommended.  She is here on a 6 month office visit. She was admitted to [facility] from 02/07-02/08 for acute on chronic pancreatitis complicated by early necorsis on CT scan. She is being treated symptomatically by GI and is scheduled to see them next week.   I had started her on Crestor at her last office visit. That she is tolerating well. Has chronic back pain and is also to be evaluated by Ortho in the next few weeks. She denies any chest pain or dyspnea on exertion. No PND or orthopnea. She does mention occasional tightness in her thigh and fatigue with walking. She continues to have non-healing ulceration on her right foot. Reports not able to wear boot that was provided. States that Dr. March Rummage is wanting to do surgery to remove "bone in her foot" to help with her callous.   Past Medical History:  Diagnosis Date  . ANEMIA, CHRONIC DISEASE NEC 03/15/2006  .  Carotid artery occlusion   . Cirrhosis ([place])   . Colon polyps   . Constipation due to opioid therapy 06/05/2014  . DEGENERATIVE JOINT DISEASE 05/06/2007   On going for >5 yrs No imaging to confirm Has tried ultram, mobic, neurontin which did not work Ambulance person tried once worked well    . Diabetes mellitus    Type II  . DIABETIC  RETINOPATHY 03/15/2006  . 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 Diastolic dysfunction 62/83/6629   Grade 1 by Echocardiogram 12/13/14  . Elevated liver enzymes   . Heart murmur   . Hemorrhoids   . History of kidney stones   . Hyperlipidemia 04/01/2011  . Hypertension   . Mild AI (aortic insufficiency)   . Moderate aortic regurgitation 09/25/2008   12/13/14 Echocardiogram    . Myalgia   . PAD (peripheral artery disease) ([place])   . PANCREATITIS, CHRONIC 03/29/2006  . Peripheral arterial disease ([place])   . Pneumonia    age [age]  . RENAL INSUFFICIENCY, CHRONIC 03/15/2006  . Tonsillar mass    Left    Past Surgical History:  Procedure Laterality Date  . BREAST SURGERY    . COLONOSCOPY    . DILATION AND CURETTAGE OF UTERUS N/A 06/16/2016   Procedure: DILATATION AND CURETTAGE;  Surgeon: Mora Bellman, MD;  Location: [facility];  Service: Gynecology;  Laterality: N/A;  . HYSTEROSCOPY N/A 06/16/2016   Procedure: HYSTEROSCOPY;  Surgeon: Mora Bellman, MD;  Location: [facility];  Service: Gynecology;  Laterality: N/A;  .  MOUTH SURGERY N/A   . POLYPECTOMY N/A 06/16/2016   Procedure: POLYPECTOMY;  Surgeon: Mora Bellman, MD;  Location: [facility];  Service: Gynecology;  Laterality: N/A;    Family History  Problem Relation Age of Onset  . Dementia Mother   . Colon cancer Neg Hx     Social History   Socioeconomic History  . Marital status: Single    Spouse name: Not on file  . Number of children: 3  . Years of education: Not on file  . Highest education level: Not on file  Occupational History  . Occupation: retired- disabled  Social Needs  . Financial resource  strain: Not on file  . Food insecurity:    Worry: Not on file    Inability: Not on file  . Transportation needs:    Medical: Not on file    Non-medical: Not on file  Tobacco Use  . Smoking status: Former Smoker    Packs/day: 0.30    Years: 44.00    Pack years: 13.20    Types: Cigarettes    Last attempt to quit: 03/2017    Years since quitting: 1.1  . Smokeless tobacco: Never Used  . Tobacco comment: started smoking in Nov 2019 few cigarettes a day  Substance and Sexual Activity  . Alcohol use: Not Currently    Alcohol/week: 0.0 standard drinks  . Drug use: No  . Sexual activity: Not on file  Lifestyle  . Physical activity:    Days per week: Not on file    Minutes per session: Not on file  . Stress: Not on file  Relationships  . Social connections:    Talks on phone: Not on file    Gets together: Not on file    Attends religious service: Not on file    Active member of club or organization: Not on file    Attends meetings of clubs or organizations: Not on file    Relationship status: Not on file  . Intimate partner violence:    Fear of current or ex partner: Not on file    Emotionally abused: Not on file    Physically abused: Not on file    Forced sexual activity: Not on file  Other Topics Concern  . Not on file  Social History Narrative  . Not on file   No outpatient medications have been marked as taking for the 05/10/18 encounter (Appointment) with Miquel Dunn, NP.      Review of Systems  Constitution: Negative for decreased appetite, malaise/fatigue, weight gain and weight loss.  Eyes: Negative for visual disturbance.  Cardiovascular: Positive for claudication. Negative for chest pain, dyspnea on exertion, leg swelling, orthopnea, palpitations and syncope.  Respiratory: Negative for hemoptysis and wheezing.   Endocrine: Negative for cold intolerance and heat intolerance.  Hematologic/Lymphatic: Does not bruise/bleed easily.  Skin: Positive for poor  wound healing. Negative for nail changes.  Musculoskeletal: Positive for back pain and joint pain (right foot). Negative for muscle weakness and myalgias.  Gastrointestinal: Negative for abdominal pain, change in bowel habit, nausea and vomiting.  Neurological: Negative for difficulty with concentration, dizziness, focal weakness and headaches.  Psychiatric/Behavioral: Negative for altered mental status and suicidal ideas.  All other systems reviewed and are negative.      Objective:     There were no vitals filed for this visit. There is no height or weight on file to calculate BMI.   Echocardiogram 09/21/2017: Left ventricle cavity is normal in size. Severe concentric  hypertrophy of the left ventricle, both septal and posterior wall measuring 1.7 cm in AP dimension. Normal global wall motion. Grade II diastolic dysfunction. Elevated LAP. Calculated EF 59%. Left atrial cavity is mildly dilated. Mild aortic valve leaflet thickening. Moderate (Grade III) aortic regurgitation. Moderate (Grade II) mitral regurgitation. Moderate tricuspid regurgitation. Estimated pulmonary artery systolic pressure 31 mmHg.  Lexiscan myoview stress test 09/06/2017: 1. Lexiscan stress test was performed. Exercise capacity was not assessed. No stress symptoms reported. Peak blood pressure was 148/88 mmHg. The resting and stress electrocardiogram demonstrated normal sinus rhythm, normal resting conduction, no resting arrhythmias and normal rest repolarization. Possible old anteroseptal infarct. Stress EKG is non diagnostic for ischemia as it is a pharmacologic stress. 2. The overall quality of the study is excellent. There is no evidence of abnormal lung activity. Stress and rest SPECT images demonstrate homogeneous tracer distribution throughout the myocardium. Gated SPECT imaging reveals normal myocardial thickening and wall motion. The left ventricular ejection fraction was normal (74%). 3. Low risk study.   ABI 09/22/2017: Moderately abnormal waveforms of the right ankle. Severely abnormal waveforms of the left ankle. Mildly decreased right resting ABI at o.96 and moderately decreased left resting ABI at 0.59.  Carotid artery duplex 09/21/2017: Minimal stenosis in the right internal carotid artery (1-15%) with heterogeneous plaque Antegrade right vertebral artery flow. Antegrade left vertebral artery flow.  Labs 07/30/2017: H/H 9.8/29.2.  MCV 80.  Platelets 145 Glucose 304.  BUN/creatinine of 4/1.36.  EGFR 41/47.  Sodium 134, potassium 4.5 Cholesterol 88, HDL 52, triglycerides 60, LDL 22 TSH 0.98 normal  Physical Exam  Constitutional: She is oriented to person, place, and time. Vital signs are normal. She appears well-developed and well-nourished.  HENT:  Head: Normocephalic and atraumatic.  Neck: Normal range of motion. Carotid bruit is present.  Cardiovascular: Normal rate, regular rhythm and intact distal pulses.  Murmur heard.  Harsh midsystolic murmur is present with a grade of 2/6 at the upper right sternal border radiating to the neck. Pulses:      Carotid pulses are on the right side with bruit and on the left side with bruit.      Femoral pulses are 2+ on the right side and 1+ on the left side with bruit.      Popliteal pulses are 0 on the right side and 0 on the left side.       Dorsalis pedis pulses are 2+ on the right side and 2+ on the left side.       Posterior tibial pulses are 0 on the right side and 0 on the left side.  Pulmonary/Chest: Effort normal and breath sounds normal. No accessory muscle usage. No respiratory distress.  Abdominal: Soft. Bowel sounds are normal.  Musculoskeletal: Normal range of motion.  Neurological: She is alert and oriented to person, place, and time.  Skin: Skin is warm and dry.  Right mid foot ulceration covered by dsg. Callus bilateral with thick rigid nails  Vitals reviewed.       Assessment & Recommendations:   1. Peripheral  arterial disease ([place]) Patient had abnormal ABI performed in July 2019, in view of lack of symptoms, medical therapy was recommended.  She is now reporting claudication symptoms in her thigh claudication.  Continues to have nonhealing ulceration on her right foot.  I recommend further evaluation with lower extremity arterial duplex and if markedly abnormal with consider PVD angiogram for management.  Continue with aspirin and statin therapy.  - EKG 12-Lead - PCV  LOWER ARTERIAL (BILATERAL); Future  2. Midfoot ulceration, right, with unspecified severity ([place]) She continues to follow with Dr. March Rummage for management of this.  Unable to visualize ulcer today in view of dressing that is in place.  Suspect nonhealing ulceration related to uncontrolled diabetes and also PAD.  As stated above, if lower extremity arterial duplex is abnormal, could consider PV angiogram for further evaluation.  - PCV LOWER ARTERIAL (BILATERAL); Future  3. Uncontrolled type 2 diabetes mellitus with chronic kidney disease, without long-term current use of insulin ([place]) Diabetes continues to be uncontrolled.  Being managed by her PCP.  Discussed diet modifications in controlling her diabetes.  4. Former tobacco use Previous history of heavy tobacco use and has essentially remained asymptomatic for the last one year except for admits to a few cigarettes in 01/15/23 after her sister died.  She has not had any cigarettes since Jan 15, 2023.  Encouraged continued smoking cessation.  Suspect etiology for her PAD.  5. Essential hypertension Well-controlled with current medications.  Continue the same.  6. Chronic pancreatitis, unspecified pancreatitis type ([place]) Has not had any recurrence of abdominal pain since being discharged from the hospital.  She is to see GI next week for further evaluation and management.   I will have her come back to see [place] after her lower extremity arterial duplex.   Jeri Lager, FNP-C [facility]  Cardiovascular, [place] Office: [phone] Fax: [phone]

## 2018-05-10 ENCOUNTER — Other Ambulatory Visit: Payer: Self-pay

## 2018-05-10 ENCOUNTER — Ambulatory Visit: Payer: Medicare Other | Admitting: Cardiology

## 2018-05-10 NOTE — Discharge Instructions (Signed)
Epoetin Alfa injection °What is this medicine? °EPOETIN ALFA (e POE e tin AL fa) helps your body make more red blood cells. This medicine is used to treat anemia caused by chronic kidney disease, cancer chemotherapy, or HIV-therapy. It may also be used before surgery if you have anemia. °This medicine may be used for other purposes; ask your health care provider or pharmacist if you have questions. °COMMON BRAND NAME(S): Epogen, Procrit, Retacrit °What should I tell my health care provider before I take this medicine? °They need to know if you have any of these conditions: °-cancer °-heart disease °-high blood pressure °-history of blood clots °-history of stroke °-low levels of folate, iron, or vitamin B12 in the blood °-seizures °-an unusual or allergic reaction to erythropoietin, albumin, benzyl alcohol, hamster proteins, other medicines, foods, dyes, or preservatives °-pregnant or trying to get pregnant °-breast-feeding °How should I use this medicine? °This medicine is for injection into a vein or under the skin. It is usually given by a health care professional in a hospital or clinic setting. °If you get this medicine at home, you will be taught how to prepare and give this medicine. Use exactly as directed. Take your medicine at regular intervals. Do not take your medicine more often than directed. °It is important that you put your used needles and syringes in a special sharps container. Do not put them in a trash can. If you do not have a sharps container, call your pharmacist or healthcare provider to get one. °A special MedGuide will be given to you by the pharmacist with each prescription and refill. Be sure to read this information carefully each time. °Talk to your pediatrician regarding the use of this medicine in children. While this drug may be prescribed for selected conditions, precautions do apply. °Overdosage: If you think you have taken too much of this medicine contact a poison control center  or emergency room at once. °NOTE: This medicine is only for you. Do not share this medicine with others. °What if I miss a dose? °If you miss a dose, take it as soon as you can. If it is almost time for your next dose, take only that dose. Do not take double or extra doses. °What may interact with this medicine? °Interactions have not been studied. °This list may not describe all possible interactions. Give your health care provider a list of all the medicines, herbs, non-prescription drugs, or dietary supplements you use. Also tell them if you smoke, drink alcohol, or use illegal drugs. Some items may interact with your medicine. °What should I watch for while using this medicine? °Your condition will be monitored carefully while you are receiving this medicine. °You may need blood work done while you are taking this medicine. °This medicine may cause a decrease in vitamin B6. You should make sure that you get enough vitamin B6 while you are taking this medicine. Discuss the foods you eat and the vitamins you take with your health care professional. °What side effects may I notice from receiving this medicine? °Side effects that you should report to your doctor or health care professional as soon as possible: °-allergic reactions like skin rash, itching or hives, swelling of the face, lips, or tongue °-seizures °-signs and symptoms of a blood clot such as breathing problems; changes in vision; chest pain; severe, sudden headache; pain, swelling, warmth in the leg; trouble speaking; sudden numbness or weakness of the face, arm or leg °-signs and symptoms of a stroke   like changes in vision; confusion; trouble speaking or understanding; severe headaches; sudden numbness or weakness of the face, arm or leg; trouble walking; dizziness; loss of balance or coordination °Side effects that usually do not require medical attention (report to your doctor or health care professional if they continue or are  bothersome): °-chills °-cough °-dizziness °-fever °-headaches °-joint pain °-muscle cramps °-muscle pain °-nausea, vomiting °-pain, redness, or irritation at site where injected °This list may not describe all possible side effects. Call your doctor for medical advice about side effects. You may report side effects to FDA at 1-800-FDA-1088. °Where should I keep my medicine? °Keep out of the reach of children. °Store in a refrigerator between 2 and 8 degrees C (36 and 46 degrees F). Do not freeze or shake. Throw away any unused portion if using a single-dose vial. Multi-dose vials can be kept in the refrigerator for up to 21 days after the initial dose. Throw away unused medicine. °NOTE: This sheet is a summary. It may not cover all possible information. If you have questions about this medicine, talk to your doctor, pharmacist, or health care provider. °© 2019 [employer]vier/[employer] (2016-09-18 08:35:19) ° °

## 2018-05-11 ENCOUNTER — Ambulatory Visit (HOSPITAL_COMMUNITY)
Admission: RE | Admit: 2018-05-11 | Discharge: 2018-05-11 | Disposition: A | Discharge status: Home / Self Care | Payer: Medicare Other | Source: Ambulatory Visit | Attending: Nephrology | Admitting: Nephrology

## 2018-05-11 VITALS — BP 119/58 | HR 77 | Temp 98.4°F | Resp 20

## 2018-05-11 DIAGNOSIS — D638 Anemia in other chronic diseases classified elsewhere: Secondary | ICD-10-CM | POA: Insufficient documentation

## 2018-05-11 LAB — POCT HEMOGLOBIN-HEMACUE: Hemoglobin: 9.8 g/dL — ABNORMAL LOW (ref 12.0–15.0)

## 2018-05-11 MED ORDER — EPOETIN ALFA 10000 UNIT/ML IJ SOLN: 5000.0000 [IU] | INTRAMUSCULAR | Status: DC

## 2018-05-11 MED ORDER — EPOETIN ALFA 2000 UNIT/ML IJ SOLN
INTRAMUSCULAR | Status: AC
  Administered 2018-05-11: 2000 [IU] via SUBCUTANEOUS
  Filled 2018-05-11: qty 1

## 2018-05-11 MED ORDER — EPOETIN ALFA 3000 UNIT/ML IJ SOLN
INTRAMUSCULAR | Status: AC
  Administered 2018-05-11: 3000 [IU] via SUBCUTANEOUS
  Filled 2018-05-11: qty 1

## 2018-05-17 ENCOUNTER — Other Ambulatory Visit (HOSPITAL_COMMUNITY): Payer: Self-pay | Admitting: *Deleted

## 2018-05-18 ENCOUNTER — Other Ambulatory Visit: Payer: Self-pay

## 2018-05-18 ENCOUNTER — Ambulatory Visit (HOSPITAL_COMMUNITY)
Admission: RE | Admit: 2018-05-18 | Discharge: 2018-05-18 | Disposition: A | Discharge status: Home / Self Care | Payer: Medicare Other | Source: Ambulatory Visit | Attending: Nephrology | Admitting: Nephrology

## 2018-05-18 VITALS — BP 129/56 | HR 80 | Temp 98.4°F | Resp 18

## 2018-05-18 DIAGNOSIS — D638 Anemia in other chronic diseases classified elsewhere: Secondary | ICD-10-CM | POA: Diagnosis present

## 2018-05-18 LAB — RENAL FUNCTION PANEL
ALBUMIN: 2.2 g/dL — AB (ref 3.5–5.0)
Anion gap: 7 (ref 5–15)
BUN: 19 mg/dL (ref 8–23)
CO2: 21 mmol/L — ABNORMAL LOW (ref 22–32)
Calcium: 7.9 mg/dL — ABNORMAL LOW (ref 8.9–10.3)
Chloride: 107 mmol/L (ref 98–111)
Creatinine, Ser: 1.7 mg/dL — ABNORMAL HIGH (ref 0.44–1.00)
GFR calc Af Amer: 36 mL/min — ABNORMAL LOW (ref >60)
GFR calc non Af Amer: 31 mL/min — ABNORMAL LOW (ref >60)
Glucose, Bld: 220 mg/dL — ABNORMAL HIGH (ref 70–99)
PHOSPHORUS: 4.8 mg/dL — AB (ref 2.5–4.6)
Potassium: 4.7 mmol/L (ref 3.5–5.1)
SODIUM: 135 mmol/L (ref 135–145)

## 2018-05-18 LAB — POCT HEMOGLOBIN-HEMACUE: Hemoglobin: 9.5 g/dL — ABNORMAL LOW (ref 12.0–15.0)

## 2018-05-18 MED ORDER — EPOETIN ALFA 2000 UNIT/ML IJ SOLN
INTRAMUSCULAR | Status: AC
  Administered 2018-05-18: 2000 [IU]
  Filled 2018-05-18: qty 1

## 2018-05-18 MED ORDER — EPOETIN ALFA 10000 UNIT/ML IJ SOLN: 5000.0000 [IU] | INTRAMUSCULAR | Status: DC

## 2018-05-18 MED ORDER — EPOETIN ALFA 3000 UNIT/ML IJ SOLN
INTRAMUSCULAR | Status: AC
  Administered 2018-05-18: 3000 [IU]
  Filled 2018-05-18: qty 1

## 2018-05-24 ENCOUNTER — Other Ambulatory Visit: Payer: Self-pay

## 2018-05-25 ENCOUNTER — Ambulatory Visit (HOSPITAL_COMMUNITY)
Admission: RE | Admit: 2018-05-25 | Discharge: 2018-05-25 | Disposition: A | Discharge status: Home / Self Care | Payer: Medicare Other | Source: Ambulatory Visit | Attending: Nephrology | Admitting: Nephrology

## 2018-05-25 VITALS — BP 134/51 | HR 69 | Temp 97.8°F | Resp 20

## 2018-05-25 DIAGNOSIS — Z79899 Other long term (current) drug therapy: Secondary | ICD-10-CM

## 2018-05-25 DIAGNOSIS — F1010 Alcohol abuse, uncomplicated: Secondary | ICD-10-CM | POA: Diagnosis not present

## 2018-05-25 DIAGNOSIS — E1142 Type 2 diabetes mellitus with diabetic polyneuropathy: Secondary | ICD-10-CM | POA: Diagnosis not present

## 2018-05-25 DIAGNOSIS — E1122 Type 2 diabetes mellitus with diabetic chronic kidney disease: Secondary | ICD-10-CM | POA: Diagnosis present

## 2018-05-25 DIAGNOSIS — Z87891 Personal history of nicotine dependence: Secondary | ICD-10-CM

## 2018-05-25 DIAGNOSIS — K859 Acute pancreatitis without necrosis or infection, unspecified: Secondary | ICD-10-CM | POA: Diagnosis not present

## 2018-05-25 DIAGNOSIS — I2510 Atherosclerotic heart disease of native coronary artery without angina pectoris: Secondary | ICD-10-CM | POA: Diagnosis present

## 2018-05-25 DIAGNOSIS — Z86010 Personal history of colonic polyps: Secondary | ICD-10-CM | POA: Diagnosis not present

## 2018-05-25 DIAGNOSIS — D638 Anemia in other chronic diseases classified elsewhere: Secondary | ICD-10-CM | POA: Insufficient documentation

## 2018-05-25 DIAGNOSIS — N183 Chronic kidney disease, stage 3 (moderate): Secondary | ICD-10-CM | POA: Diagnosis not present

## 2018-05-25 DIAGNOSIS — K7460 Unspecified cirrhosis of liver: Secondary | ICD-10-CM | POA: Diagnosis present

## 2018-05-25 DIAGNOSIS — H409 Unspecified glaucoma: Secondary | ICD-10-CM | POA: Diagnosis not present

## 2018-05-25 DIAGNOSIS — K219 Gastro-esophageal reflux disease without esophagitis: Secondary | ICD-10-CM | POA: Diagnosis not present

## 2018-05-25 DIAGNOSIS — Z7982 Long term (current) use of aspirin: Secondary | ICD-10-CM

## 2018-05-25 DIAGNOSIS — Z794 Long term (current) use of insulin: Secondary | ICD-10-CM

## 2018-05-25 DIAGNOSIS — E11319 Type 2 diabetes mellitus with unspecified diabetic retinopathy without macular edema: Secondary | ICD-10-CM | POA: Diagnosis not present

## 2018-05-25 DIAGNOSIS — E1151 Type 2 diabetes mellitus with diabetic peripheral angiopathy without gangrene: Secondary | ICD-10-CM | POA: Diagnosis not present

## 2018-05-25 DIAGNOSIS — K861 Other chronic pancreatitis: Secondary | ICD-10-CM | POA: Diagnosis present

## 2018-05-25 DIAGNOSIS — Z87442 Personal history of urinary calculi: Secondary | ICD-10-CM | POA: Diagnosis not present

## 2018-05-25 DIAGNOSIS — Z888 Allergy status to other drugs, medicaments and biological substances status: Secondary | ICD-10-CM

## 2018-05-25 DIAGNOSIS — E785 Hyperlipidemia, unspecified: Secondary | ICD-10-CM | POA: Diagnosis not present

## 2018-05-25 DIAGNOSIS — I129 Hypertensive chronic kidney disease with stage 1 through stage 4 chronic kidney disease, or unspecified chronic kidney disease: Secondary | ICD-10-CM | POA: Diagnosis present

## 2018-05-25 DIAGNOSIS — K8681 Exocrine pancreatic insufficiency: Secondary | ICD-10-CM | POA: Diagnosis not present

## 2018-05-25 DIAGNOSIS — R1013 Epigastric pain: Secondary | ICD-10-CM | POA: Diagnosis present

## 2018-05-25 LAB — POCT HEMOGLOBIN-HEMACUE: Hemoglobin: 9.2 g/dL — ABNORMAL LOW (ref 12.0–15.0)

## 2018-05-25 MED ORDER — EPOETIN ALFA 3000 UNIT/ML IJ SOLN
INTRAMUSCULAR | Status: AC
  Administered 2018-05-25: 3000 [IU] via SUBCUTANEOUS
  Filled 2018-05-25: qty 1

## 2018-05-25 MED ORDER — EPOETIN ALFA 10000 UNIT/ML IJ SOLN: 5000.0000 [IU] | INTRAMUSCULAR | Status: DC

## 2018-05-25 MED ORDER — EPOETIN ALFA 2000 UNIT/ML IJ SOLN
INTRAMUSCULAR | Status: AC
  Administered 2018-05-25: 2000 [IU] via SUBCUTANEOUS
  Filled 2018-05-25: qty 1

## 2018-05-26 ENCOUNTER — Emergency Department (HOSPITAL_COMMUNITY): Payer: Medicare Other

## 2018-05-26 ENCOUNTER — Other Ambulatory Visit: Payer: Self-pay

## 2018-05-26 ENCOUNTER — Inpatient Hospital Stay (HOSPITAL_COMMUNITY)
Admission: EM | Admit: 2018-05-26 | Discharge: 2018-05-29 | DRG: 440 | Disposition: A | Discharge status: Home / Self Care | Payer: Medicare Other | Attending: Internal Medicine | Admitting: Internal Medicine

## 2018-05-26 ENCOUNTER — Encounter (HOSPITAL_COMMUNITY): Payer: Self-pay

## 2018-05-26 DIAGNOSIS — N183 Chronic kidney disease, stage 3 (moderate): Secondary | ICD-10-CM

## 2018-05-26 DIAGNOSIS — E1169 Type 2 diabetes mellitus with other specified complication: Secondary | ICD-10-CM

## 2018-05-26 DIAGNOSIS — E669 Obesity, unspecified: Secondary | ICD-10-CM

## 2018-05-26 DIAGNOSIS — E1122 Type 2 diabetes mellitus with diabetic chronic kidney disease: Secondary | ICD-10-CM | POA: Diagnosis present

## 2018-05-26 DIAGNOSIS — K859 Acute pancreatitis without necrosis or infection, unspecified: Secondary | ICD-10-CM | POA: Diagnosis not present

## 2018-05-26 DIAGNOSIS — R1013 Epigastric pain: Secondary | ICD-10-CM

## 2018-05-26 DIAGNOSIS — E119 Type 2 diabetes mellitus without complications: Secondary | ICD-10-CM | POA: Diagnosis present

## 2018-05-26 DIAGNOSIS — D638 Anemia in other chronic diseases classified elsewhere: Secondary | ICD-10-CM | POA: Diagnosis present

## 2018-05-26 DIAGNOSIS — I10 Essential (primary) hypertension: Secondary | ICD-10-CM | POA: Diagnosis present

## 2018-05-26 DIAGNOSIS — K861 Other chronic pancreatitis: Secondary | ICD-10-CM

## 2018-05-26 LAB — COMPREHENSIVE METABOLIC PANEL
ALT: 78 U/L — ABNORMAL HIGH (ref 0–44)
AST: 49 U/L — ABNORMAL HIGH (ref 15–41)
Albumin: 2.9 g/dL — ABNORMAL LOW (ref 3.5–5.0)
Alkaline Phosphatase: 166 U/L — ABNORMAL HIGH (ref 38–126)
Anion gap: 5 (ref 5–15)
BUN: 30 mg/dL — ABNORMAL HIGH (ref 8–23)
CO2: 22 mmol/L (ref 22–32)
Calcium: 8.9 mg/dL (ref 8.9–10.3)
Chloride: 106 mmol/L (ref 98–111)
Creatinine, Ser: 1.17 mg/dL — ABNORMAL HIGH (ref 0.44–1.00)
GFR calc Af Amer: 56 mL/min — ABNORMAL LOW (ref >60)
GFR calc non Af Amer: 49 mL/min — ABNORMAL LOW (ref >60)
Glucose, Bld: 144 mg/dL — ABNORMAL HIGH (ref 70–99)
Potassium: 4.6 mmol/L (ref 3.5–5.1)
Sodium: 133 mmol/L — ABNORMAL LOW (ref 135–145)
Total Bilirubin: 0.4 mg/dL (ref 0.3–1.2)
Total Protein: 8.4 g/dL — ABNORMAL HIGH (ref 6.5–8.1)

## 2018-05-26 LAB — CBC WITH DIFFERENTIAL/PLATELET
Abs Immature Granulocytes: 0.02 10*3/uL (ref 0.00–0.07)
Basophils Absolute: 0 10*3/uL (ref 0.0–0.1)
Basophils Relative: 0 %
Eosinophils Absolute: 0 10*3/uL (ref 0.0–0.5)
Eosinophils Relative: 0 %
HCT: 34.9 % — ABNORMAL LOW (ref 36.0–46.0)
Hemoglobin: 10.9 g/dL — ABNORMAL LOW (ref 12.0–15.0)
Immature Granulocytes: 0 %
Lymphocytes Relative: 17 %
Lymphs Abs: 1 10*3/uL (ref 0.7–4.0)
MCH: 27.3 pg (ref 26.0–34.0)
MCHC: 31.2 g/dL (ref 30.0–36.0)
MCV: 87.3 fL (ref 80.0–100.0)
Monocytes Absolute: 0.5 10*3/uL (ref 0.1–1.0)
Monocytes Relative: 9 %
Neutro Abs: 4.2 10*3/uL (ref 1.7–7.7)
Neutrophils Relative %: 74 %
Platelets: 205 10*3/uL (ref 150–400)
RBC: 4 MIL/uL (ref 3.87–5.11)
RDW: 15.8 % — ABNORMAL HIGH (ref 11.5–15.5)
WBC: 5.8 10*3/uL (ref 4.0–10.5)
nRBC: 0 % (ref 0.0–0.2)

## 2018-05-26 LAB — URINALYSIS, ROUTINE W REFLEX MICROSCOPIC
Bacteria, UA: NONE SEEN
Bilirubin Urine: NEGATIVE
Glucose, UA: NEGATIVE mg/dL
Hgb urine dipstick: NEGATIVE
Ketones, ur: NEGATIVE mg/dL
Leukocytes,Ua: NEGATIVE
Nitrite: NEGATIVE
Protein, ur: 30 mg/dL — AB
Specific Gravity, Urine: 1.013 (ref 1.005–1.030)
pH: 5 (ref 5.0–8.0)

## 2018-05-26 LAB — LIPASE, BLOOD: Lipase: 20 U/L (ref 11–51)

## 2018-05-26 MED ORDER — LACTATED RINGERS IV BOLUS
1000.0000 mL | Freq: Once | INTRAVENOUS | Status: AC
  Administered 2018-05-26: 1000 mL via INTRAVENOUS

## 2018-05-26 MED ORDER — MORPHINE SULFATE (PF) 4 MG/ML IV SOLN
4.0000 mg | Freq: Once | INTRAVENOUS | Status: AC
  Administered 2018-05-26: 4 mg via INTRAVENOUS
  Filled 2018-05-26: qty 1

## 2018-05-26 MED ORDER — ONDANSETRON HCL 4 MG/2ML IJ SOLN
4.0000 mg | Freq: Once | INTRAMUSCULAR | Status: AC
  Administered 2018-05-26: 22:00:00 4 mg via INTRAVENOUS
  Filled 2018-05-26: qty 2

## 2018-05-26 MED ORDER — MORPHINE SULFATE (PF) 4 MG/ML IV SOLN
4.0000 mg | Freq: Once | INTRAVENOUS | Status: AC
  Administered 2018-05-26: 22:00:00 4 mg via INTRAVENOUS
  Filled 2018-05-26: qty 1

## 2018-05-26 MED ORDER — IOHEXOL 300 MG/ML  SOLN
100.0000 mL | Freq: Once | INTRAMUSCULAR | Status: AC | PRN
  Administered 2018-05-26: 23:00:00 100 mL via INTRAVENOUS

## 2018-05-26 NOTE — ED Notes (Signed)
Patient transported to CT scan . 

## 2018-05-26 NOTE — ED Notes (Signed)
IV team consulted due to multiple unsuccessful IV attempts .

## 2018-05-26 NOTE — ED Provider Notes (Signed)
[facility] EMERGENCY DEPARTMENT Provider Note   CSN: [id number] Arrival date & time: 05/26/18  1714    History   Chief Complaint Chief Complaint  Patient presents with   Back Pain    HPI [name] is a 68 y.o. female with extensive past medical history as stated below most notably for chronic pancreatitis who presents to the emergency department complaining of severe diffuse abdominal pain that started 7 hours ago.  He states it was sudden in onset and feels like a sharp stabbing like sensation that occasionally radiates through to her back.  She states that she is never had symptoms like this in the past.  She denies any nausea, vomiting, diarrhea or constipation.  She states that she has been passing gas since onset of symptoms.  She denies any recent fevers, chills, cough/cold/congestion, chest pain, shortness of breath, or sick contacts.      Illness  Severity:  Severe Onset quality:  Sudden Duration:  7 hours Timing:  Constant Progression:  Unchanged Chronicity:  New Associated symptoms: abdominal pain   Associated symptoms: no chest pain, no cough, no ear pain, no fever, no nausea, no rash, no shortness of breath, no sore throat and no vomiting     Past Medical History:  Diagnosis Date   ANEMIA, CHRONIC DISEASE NEC 03/15/2006   Carotid artery occlusion    Cirrhosis ([place])    Colon polyps    Constipation due to opioid therapy 06/05/2014   DEGENERATIVE JOINT DISEASE 05/06/2007   On going for >5 yrs No imaging to confirm Has tried ultram, mobic, neurontin which did not work Ambulance person tried once worked well     Diabetes mellitus    Type II   DIABETIC  RETINOPATHY 03/15/2006   DIABETIC PERIPHERAL NEUROPATHY 9/62/9528   Diastolic dysfunction 41/32/4401   Grade 1 by Echocardiogram 12/13/14   Elevated liver enzymes    Heart murmur    Hemorrhoids    History of kidney stones    Hyperlipidemia 04/01/2011   Hypertension    Mild AI (aortic  insufficiency)    Moderate aortic regurgitation 09/25/2008   12/13/14 Echocardiogram     Myalgia    PAD (peripheral artery disease) ([place])    PANCREATITIS, CHRONIC 03/29/2006   Peripheral arterial disease ([place])    Pneumonia    age 43-20   RENAL INSUFFICIENCY, CHRONIC 03/15/2006   Tonsillar mass    Left    Patient Active Problem List   Diagnosis Date Noted   Midfoot ulceration, right, with unspecified severity ([place]) 04/05/2018   Acute pancreatitis with uninfected necrosis 04/01/2018   Constipation 04/01/2018   Recurrent epistaxis 05/31/2017   Throat pain in adult 05/31/2017   Tubular adenoma of colon 06/30/2016   Diverticulosis of colon without hemorrhage 06/30/2016   Postmenopausal vaginal bleeding    Long term (current) use of opiate analgesic 04/09/2016   Bilateral carotid artery disease ([place]) 11/28/2015   Pancreatic insufficiency 05/09/2015   Open-angle glaucoma of both eyes 03/29/2015   Risk for coronary artery disease greater than 20% in next 10 years 02/72/5366   Diastolic dysfunction 44/04/4740   Vulvar cyst 01/23/2015   Abnormal uterine bleeding 12/02/2014   Unexplained night sweats 12/02/2014   Constipation due to opioid therapy 06/05/2014   Loss of weight 03/29/2014   Peripheral arterial disease ([place]) 12/12/2013   GERD (gastroesophageal reflux disease) 10/12/2012   Healthcare maintenance 09/29/2012   Back pain 03/28/2012   Hyperlipidemia 04/01/2011   BOILS, RECURRENT 03/13/2010  Moderate aortic regurgitation 09/25/2008   HEMORRHOIDS 06/08/2008   Osteoarthritis 05/06/2007   Former tobacco use 09/27/2006   History of osteoporosis 06/29/2006   Chronic pancreatitis ([place]) 03/29/2006   Uncontrolled type 2 diabetes mellitus with chronic kidney disease, without long-term current use of insulin ([place]) 03/15/2006   Diabetic neuropathy, painful ([place]) 03/15/2006   Anemia of chronic disease 03/15/2006   Essential hypertension  03/15/2006   VENTRICULAR HYPERTROPHY, LEFT 03/15/2006   CKD stage 3 due to type 2 diabetes mellitus ([place]) 03/15/2006    Past Surgical History:  Procedure Laterality Date   BREAST SURGERY     COLONOSCOPY     DILATION AND CURETTAGE OF UTERUS N/A 06/16/2016   Procedure: DILATATION AND CURETTAGE;  Surgeon: Mora Bellman, MD;  Location: [facility];  Service: Gynecology;  Laterality: N/A;   HYSTEROSCOPY N/A 06/16/2016   Procedure: HYSTEROSCOPY;  Surgeon: Mora Bellman, MD;  Location: [facility];  Service: Gynecology;  Laterality: N/A;   MOUTH SURGERY N/A    POLYPECTOMY N/A 06/16/2016   Procedure: POLYPECTOMY;  Surgeon: Mora Bellman, MD;  Location: [facility];  Service: Gynecology;  Laterality: N/A;     OB History    Gravida  3   Para  3   Term  3   Preterm      AB      Living  3     SAB      TAB      Ectopic      Multiple      Live Births  3            Home Medications    Prior to Admission medications   Medication Sig Start Date End Date Taking? Authorizing Provider  ACCU-CHEK AVIVA PLUS test strip TEST three times a day 11/25/15   Lucious Groves, DO  amLODipine (NORVASC) 10 MG tablet Take 10 mg by mouth daily. 06/01/17   [provider]  aspirin EC 81 MG tablet Take 1 tablet (81 mg total) by mouth daily. 04/09/16   Lucious Groves, DO  Blood Glucose Monitoring Suppl (ACCU-CHEK AVIVA PLUS) w/Device KIT Use to check you blood sugar 3 times a day 06/04/15   Oval Linsey, MD  cyclobenzaprine (FLEXERIL) 10 MG tablet Take 1 tablet (10 mg total) by mouth 3 (three) times daily as needed for muscle spasms. 02/27/18   Virgel Manifold, MD  diclofenac sodium (VOLTAREN) 1 % GEL Apply 4 g topically 3 (three) times daily as needed. Patient taking differently: Apply 4 g topically 3 (three) times daily as needed (for pain).  03/13/18   Duffy Bruce, MD  HYDROcodone-acetaminophen (NORCO) 5-325 MG tablet Take 1 tablet by mouth every 4 (four) hours as needed for moderate pain.  08/27/06   Delora Fuel, MD  LANTUS SOLOSTAR 100 UNIT/ML Solostar Pen Inject 20 Units into the skin daily after breakfast.  05/04/17   [provider]  LINZESS 145 MCG CAPS capsule Take 145 mcg by mouth daily.  03/07/17   [provider]  Multiple Vitamins-Minerals (ONE-A-DAY WOMENS 50+ ADVANTAGE) TABS Take 1 tablet by mouth daily with breakfast.    [provider]  NONFORMULARY OR COMPOUNDED ITEM Apply 1-2 g topically 4 (four) times daily. [facility]  Peripheral Neuropathy Cream- Bupivacaine 1%, Doxepin 3%, Gabapentin 6%, Pentoxifylline 3%, Topiramate 1% Apply 1-2 grams to affected area 3-4 times daily Qty. 120 gm 3 refills 12/03/17   Evelina Bucy, DPM  ondansetron (ZOFRAN) 4 MG tablet Take 1 tablet (4 mg total)  by mouth every 6 (six) hours as needed for nausea or vomiting. 5/0/56   Delora Fuel, MD  Pancrelipase, Lip-Prot-Amyl, 24000-76000 units CPEP Take 4 capsules (96,000 Units total) by mouth See admin instructions. Take 4 capsules by mouth 2-3 times daily before meals Patient taking differently: Take 2-4 capsules by mouth See admin instructions. Take 4 capsules by mouth two to three times a day with meals and 2 capsules with any snacks 03/10/16   Lucious Groves, DO  PROAIR HFA 108 [phone] Base) MCG/ACT inhaler Inhale 2 puffs into the lungs 4 (four) times daily as needed for wheezing or shortness of breath. 04/30/17   [provider]  ranitidine (ZANTAC) 150 MG tablet Take 1 tablet (150 mg total) by mouth daily. 01/18/18   Irene Shipper, MD  rosuvastatin (CRESTOR) 20 MG tablet TAKE 1 TABLET BY MOUTH EVERY DAY 04/04/18   Miquel Dunn, NP  sitaGLIPtin (JANUVIA) 50 MG tablet Take 1 tablet (50 mg total) by mouth daily. 09/10/16   Lucious Groves, DO  sucralfate (CARAFATE) 1 g tablet Take 1 tablet (1 g total) by mouth 4 (four) times daily -  with meals and at bedtime. 03/28/18   Malvin Johns, MD  sucralfate (CARAFATE) 1 g tablet Take 1 tablet (1 g total)  by mouth 3 (three) times daily after meals. 04/22/18   Pyrtle, Lajuan Lines, MD    Family History Family History  Problem Relation Age of Onset   Dementia Mother    Colon cancer Neg Hx     Social History Social History   Tobacco Use   Smoking status: Former Smoker    Packs/day: 0.30    Years: 44.00    Pack years: 13.20    Types: Cigarettes    Last attempt to quit: 03/2017    Years since quitting: 1.1   Smokeless tobacco: Never Used   Tobacco comment: started smoking in Nov 2019 few cigarettes a day  Substance Use Topics   Alcohol use: Not Currently    Alcohol/week: 0.0 standard drinks   Drug use: No     Allergies   Atorvastatin; Gabapentin; and Nicorette [nicotine]   Review of Systems Review of Systems  Constitutional: Negative for chills and fever.  HENT: Negative for ear pain and sore throat.   Eyes: Negative for pain and visual disturbance.  Respiratory: Negative for cough and shortness of breath.   Cardiovascular: Negative for chest pain and palpitations.  Gastrointestinal: Positive for abdominal distention and abdominal pain. Negative for nausea and vomiting.  Genitourinary: Negative for dysuria and hematuria.  Musculoskeletal: Positive for back pain. Negative for arthralgias.  Skin: Negative for color change and rash.  Neurological: Negative for seizures and syncope.  All other systems reviewed and are negative.    Physical Exam Updated Vital Signs BP (!) 150/87 (BP Location: Left Arm)    Pulse 89    Temp 100.3 F (37.9 C) (Oral)    Resp 16    LMP 02/22/2016    SpO2 97%   Physical Exam Vitals signs and nursing note reviewed.  Constitutional:      General: She is not in acute distress.    Appearance: She is well-developed.  HENT:     Head: Normocephalic and atraumatic.  Eyes:     Conjunctiva/sclera: Conjunctivae normal.  Neck:     Musculoskeletal: Neck supple.  Cardiovascular:     Rate and Rhythm: Normal rate and regular rhythm.     Heart  sounds: Murmur (chronic) present.  Pulmonary:     Effort: Pulmonary effort is normal. No respiratory distress.     Breath sounds: Normal breath sounds.  Abdominal:     Comments: Abdomen is soft and mildly protuberant.  She has tenderness to palpation diffusely throughout the abdomen with no rebound or guarding.  Musculoskeletal: Normal range of motion.     Right lower leg: No edema.     Left lower leg: No edema.     Comments: No tenderness palpation directly over thoracic or lumbar spine.  Skin:    General: Skin is warm and dry.  Neurological:     General: No focal deficit present.     Mental Status: She is alert and oriented to person, place, and time.     Motor: No weakness.      ED Treatments / Results  Labs (all labs ordered are listed, but only abnormal results are displayed) Labs Reviewed  CBC WITH DIFFERENTIAL/PLATELET - Abnormal; Notable for the following components:      Result Value   Hemoglobin 10.9 (*)    HCT 34.9 (*)    RDW 15.8 (*)    All other components within normal limits  COMPREHENSIVE METABOLIC PANEL - Abnormal; Notable for the following components:   Sodium 133 (*)    Glucose, Bld 144 (*)    BUN 30 (*)    Creatinine, Ser 1.17 (*)    Total Protein 8.4 (*)    Albumin 2.9 (*)    AST 49 (*)    ALT 78 (*)    Alkaline Phosphatase 166 (*)    GFR calc non Af Amer 49 (*)    GFR calc Af Amer 56 (*)    All other components within normal limits  URINALYSIS, ROUTINE W REFLEX MICROSCOPIC - Abnormal; Notable for the following components:   Protein, ur 30 (*)    All other components within normal limits  URINE CULTURE  LIPASE, BLOOD    EKG EKG Interpretation  Date/Time:  Thursday May 26 2018 21:36:57 EDT Ventricular Rate:  87 PR Interval:    QRS Duration: 104 QT Interval:  389 QTC Calculation: 468 R Axis:   -37 Text Interpretation:  Sinus rhythm Left axis deviation Probable anteroseptal infarct, recent No significant change since last tracing  Confirmed by Isla Pence [phone]) on 05/26/2018 9:38:55 PM   Radiology Ct Abdomen Pelvis W Contrast  Result Date: 05/26/2018 CLINICAL DATA:  Back/abdominal pain, chronic pancreatitis EXAM: CT ABDOMEN AND PELVIS WITH CONTRAST TECHNIQUE: Multidetector CT imaging of the abdomen and pelvis was performed using the standard protocol following bolus administration of intravenous contrast. CONTRAST:  174m OMNIPAQUE IOHEXOL 300 MG/ML  SOLN COMPARISON:  03/31/2018 FINDINGS: Lower chest: Scarring/atelectasis in the bilateral lower lobes. Hepatobiliary: Liver is notable for mildly heterogeneous enhancement. Status post cholecystectomy. No intrahepatic ductal dilatation. Common duct measures 16 mm and tapers at the ampulla. Pancreas: Enlargement of the pancreatic head/uncinate process (series 3/image 33), new from recent CT, suggesting acute pancreatitis. Associated diffuse parenchymal calcifications, reflecting sequela of prior/chronic pancreatitis. Mild atrophy the pancreatic body/tail. Mild peripancreatic stranding/fluid. No drainable fluid collection/pseudocyst. Spleen: Within normal limits. Adrenals/Urinary Tract: Adrenal glands are within normal limits. Bilateral renal cysts.  No hydronephrosis. Bladder is within normal limits. Stomach/Bowel: Stomach is within normal limits. No evidence of bowel obstruction. Normal appendix (series 3/image 52). Mild to moderate colonic stool burden. Vascular/Lymphatic: No evidence of abdominal aortic aneurysm. Atherosclerotic calcifications of the abdominal aorta and branch vessels. No suspicious abdominopelvic lymphadenopathy. Reproductive: Uterus is within normal limits.  No adnexal masses. Other: No abdominopelvic ascites. Tiny fat containing right inguinal hernia. Musculoskeletal: Degenerative changes at L4-5. IMPRESSION: Suspected acute on chronic pancreatitis, as above. Follow-up MRI abdomen is suggested in 6 weeks. Additional ancillary findings as above. Electronically Signed    By: Julian Hy M.D.   On: 05/26/2018 23:07    Procedures Procedures (including critical care time)  Medications Ordered in ED Medications  morphine 4 MG/ML injection 4 mg (has no administration in time range)  lactated ringers bolus 1,000 mL (1,000 mLs Intravenous New Bag/Given 05/26/18 2207)  morphine 4 MG/ML injection 4 mg (4 mg Intravenous Given 05/26/18 2207)  ondansetron (ZOFRAN) injection 4 mg (4 mg Intravenous Given 05/26/18 2208)  iohexol (OMNIPAQUE) 300 MG/ML solution 100 mL (100 mLs Intravenous Contrast Given 05/26/18 2245)     Initial Impression / Assessment and Plan / ED Course  I have reviewed the triage vital signs and the nursing notes.  Pertinent labs & imaging results that were available during my care of the patient were reviewed by me and considered in my medical decision making (see chart for details).        Patient is a 68 year old female with history of chronic pancreatitis who presents to the emergency department complaining of diffuse abdominal pain with occasional radiation into the back that started 7 hours ago.  On initial evaluation the patient she was hemodynamically stable and nontoxic-appearing.  The patient was afebrile, not tachycardic, mildly hypertensive at 150/87, satting 97% on room air.  Physical exam as detailed above which is remarkable for 68 year old female in no significant distress.  She has protuberance of the abdomen with diffuse tenderness palpation however there are no peritoneal signs present.  Concern for acute on chronic pancreatitis.   Patient given IV morphine, IV fluids, and IV Zofran for symptomatic relief.  CBC with no leukocytosis and hemoglobin of 10.9 which is improved from prior studies.  Metabolic panel with no significant electrolyte derangements.  Creatinine is improved from prior studies now down to 1.17.  Lipase within normal limits. UA with no evidence of infection or hematuria.   CT scan consistent with acute on  chronic pancreatitis. She denies any EtOH use at this time.   Patient was reevaluated and continues to complain of diffuse abdominal pain despite IV morphine.  She was given additional dose of IV morphine while in the ED and case was discussed with hospitalist service who was in agreement with admission at this time.  She was in stable condition while in the emergency department and at the time of admission.  [name] was evaluated in Emergency Department on 05/26/2018 for the symptoms described in the history of present illness. She was evaluated in the context of the global COVID-19 pandemic, which necessitated consideration that the patient might be at risk for infection with the SARS-CoV-2 virus that causes COVID-19. Institutional protocols and algorithms that pertain to the evaluation of patients at risk for COVID-19 are in a state of rapid change based on information released by regulatory bodies including the CDC and federal and state organizations. These policies and algorithms were followed during the patient's care in the ED.     Final Clinical Impressions(s) / ED Diagnoses   Final diagnoses:  Acute on chronic pancreatitis [facility])    ED Discharge Orders    None       Tommie Raymond, MD 05/26/18 7121    Isla Pence, MD 05/26/18 2344

## 2018-05-26 NOTE — ED Triage Notes (Signed)
Pt states she had an injection for her kidneys yesterday and then began to have back pain today. Pt also reports abdominal pain. AOX4. Skin warm and dry.

## 2018-05-27 ENCOUNTER — Observation Stay (HOSPITAL_COMMUNITY): Payer: Medicare Other

## 2018-05-27 ENCOUNTER — Encounter (HOSPITAL_COMMUNITY): Payer: Self-pay | Admitting: Internal Medicine

## 2018-05-27 ENCOUNTER — Other Ambulatory Visit: Payer: Self-pay

## 2018-05-27 DIAGNOSIS — D638 Anemia in other chronic diseases classified elsewhere: Secondary | ICD-10-CM | POA: Diagnosis not present

## 2018-05-27 DIAGNOSIS — E1142 Type 2 diabetes mellitus with diabetic polyneuropathy: Secondary | ICD-10-CM | POA: Diagnosis not present

## 2018-05-27 DIAGNOSIS — Z7982 Long term (current) use of aspirin: Secondary | ICD-10-CM | POA: Diagnosis not present

## 2018-05-27 DIAGNOSIS — K861 Other chronic pancreatitis: Secondary | ICD-10-CM | POA: Diagnosis not present

## 2018-05-27 DIAGNOSIS — E1151 Type 2 diabetes mellitus with diabetic peripheral angiopathy without gangrene: Secondary | ICD-10-CM | POA: Diagnosis not present

## 2018-05-27 DIAGNOSIS — R1013 Epigastric pain: Secondary | ICD-10-CM | POA: Diagnosis present

## 2018-05-27 DIAGNOSIS — E1122 Type 2 diabetes mellitus with diabetic chronic kidney disease: Secondary | ICD-10-CM | POA: Diagnosis not present

## 2018-05-27 DIAGNOSIS — Z87891 Personal history of nicotine dependence: Secondary | ICD-10-CM | POA: Diagnosis not present

## 2018-05-27 DIAGNOSIS — N183 Chronic kidney disease, stage 3 (moderate): Secondary | ICD-10-CM | POA: Diagnosis not present

## 2018-05-27 DIAGNOSIS — Z87442 Personal history of urinary calculi: Secondary | ICD-10-CM | POA: Diagnosis not present

## 2018-05-27 DIAGNOSIS — F1010 Alcohol abuse, uncomplicated: Secondary | ICD-10-CM | POA: Diagnosis not present

## 2018-05-27 DIAGNOSIS — Z79899 Other long term (current) drug therapy: Secondary | ICD-10-CM | POA: Diagnosis not present

## 2018-05-27 DIAGNOSIS — K7460 Unspecified cirrhosis of liver: Secondary | ICD-10-CM | POA: Diagnosis not present

## 2018-05-27 DIAGNOSIS — E1169 Type 2 diabetes mellitus with other specified complication: Secondary | ICD-10-CM | POA: Diagnosis not present

## 2018-05-27 DIAGNOSIS — H409 Unspecified glaucoma: Secondary | ICD-10-CM | POA: Diagnosis not present

## 2018-05-27 DIAGNOSIS — E785 Hyperlipidemia, unspecified: Secondary | ICD-10-CM | POA: Diagnosis not present

## 2018-05-27 DIAGNOSIS — K859 Acute pancreatitis without necrosis or infection, unspecified: Principal | ICD-10-CM

## 2018-05-27 DIAGNOSIS — E669 Obesity, unspecified: Secondary | ICD-10-CM | POA: Diagnosis present

## 2018-05-27 DIAGNOSIS — I2510 Atherosclerotic heart disease of native coronary artery without angina pectoris: Secondary | ICD-10-CM | POA: Diagnosis not present

## 2018-05-27 DIAGNOSIS — E11319 Type 2 diabetes mellitus with unspecified diabetic retinopathy without macular edema: Secondary | ICD-10-CM | POA: Diagnosis not present

## 2018-05-27 DIAGNOSIS — Z794 Long term (current) use of insulin: Secondary | ICD-10-CM | POA: Diagnosis not present

## 2018-05-27 DIAGNOSIS — I129 Hypertensive chronic kidney disease with stage 1 through stage 4 chronic kidney disease, or unspecified chronic kidney disease: Secondary | ICD-10-CM | POA: Diagnosis not present

## 2018-05-27 DIAGNOSIS — K219 Gastro-esophageal reflux disease without esophagitis: Secondary | ICD-10-CM | POA: Diagnosis not present

## 2018-05-27 DIAGNOSIS — Z86010 Personal history of colonic polyps: Secondary | ICD-10-CM | POA: Diagnosis not present

## 2018-05-27 DIAGNOSIS — K8681 Exocrine pancreatic insufficiency: Secondary | ICD-10-CM | POA: Diagnosis not present

## 2018-05-27 DIAGNOSIS — Z888 Allergy status to other drugs, medicaments and biological substances status: Secondary | ICD-10-CM | POA: Diagnosis not present

## 2018-05-27 LAB — HEPATIC FUNCTION PANEL
ALT: 63 U/L — ABNORMAL HIGH (ref 0–44)
AST: 52 U/L — ABNORMAL HIGH (ref 15–41)
Albumin: 2.4 g/dL — ABNORMAL LOW (ref 3.5–5.0)
Alkaline Phosphatase: 145 U/L — ABNORMAL HIGH (ref 38–126)
Bilirubin, Direct: <0.1 mg/dL (ref 0.0–0.2)
Total Bilirubin: 0.4 mg/dL (ref 0.3–1.2)
Total Protein: 6.9 g/dL (ref 6.5–8.1)

## 2018-05-27 LAB — URINE CULTURE

## 2018-05-27 LAB — GLUCOSE, CAPILLARY
Glucose-Capillary: 141 mg/dL — ABNORMAL HIGH (ref 70–99)
Glucose-Capillary: 141 mg/dL — ABNORMAL HIGH (ref 70–99)
Glucose-Capillary: 209 mg/dL — ABNORMAL HIGH (ref 70–99)
Glucose-Capillary: 41 mg/dL — CL (ref 70–99)
Glucose-Capillary: 55 mg/dL — ABNORMAL LOW (ref 70–99)
Glucose-Capillary: 74 mg/dL (ref 70–99)
Glucose-Capillary: 79 mg/dL (ref 70–99)
Glucose-Capillary: 99 mg/dL (ref 70–99)

## 2018-05-27 MED ORDER — ACETAMINOPHEN 650 MG RE SUPP: 650.0000 mg | Freq: Four times a day (QID) | RECTAL | Status: DC | PRN

## 2018-05-27 MED ORDER — INSULIN ASPART 100 UNIT/ML ~~LOC~~ SOLN
0.0000 [IU] | Freq: Three times a day (TID) | SUBCUTANEOUS | Status: DC
  Administered 2018-05-27 – 2018-05-28 (×2): 1 [IU] via SUBCUTANEOUS
  Administered 2018-05-28: 5 [IU] via SUBCUTANEOUS

## 2018-05-27 MED ORDER — AMLODIPINE BESYLATE 10 MG PO TABS: 10.0000 mg | ORAL_TABLET | Freq: Every day | ORAL | Status: DC

## 2018-05-27 MED ORDER — PANCRELIPASE (LIP-PROT-AMYL) 12000-38000 UNITS PO CPEP
24000.0000 [IU] | ORAL_CAPSULE | Freq: Three times a day (TID) | ORAL | Status: DC
  Administered 2018-05-27 – 2018-05-29 (×7): 24000 [IU] via ORAL
  Filled 2018-05-27 (×7): qty 2

## 2018-05-27 MED ORDER — ACETAMINOPHEN 325 MG PO TABS: 650.0000 mg | ORAL_TABLET | Freq: Four times a day (QID) | ORAL | Status: DC | PRN

## 2018-05-27 MED ORDER — ONDANSETRON HCL 4 MG PO TABS: 4.0000 mg | ORAL_TABLET | Freq: Four times a day (QID) | ORAL | Status: DC | PRN

## 2018-05-27 MED ORDER — ENOXAPARIN SODIUM 40 MG/0.4ML ~~LOC~~ SOLN
40.0000 mg | Freq: Every day | SUBCUTANEOUS | Status: DC
  Administered 2018-05-27 – 2018-05-28 (×2): 40 mg via SUBCUTANEOUS
  Filled 2018-05-27 (×3): qty 0.4

## 2018-05-27 MED ORDER — ONDANSETRON HCL 4 MG/2ML IJ SOLN: 4.0000 mg | Freq: Four times a day (QID) | INTRAMUSCULAR | Status: DC | PRN

## 2018-05-27 MED ORDER — LACTATED RINGERS IV SOLN
INTRAVENOUS | Status: DC
  Administered 2018-05-27 (×2): via INTRAVENOUS

## 2018-05-27 MED ORDER — PANTOPRAZOLE SODIUM 40 MG PO TBEC
40.0000 mg | DELAYED_RELEASE_TABLET | Freq: Every day | ORAL | Status: DC
  Administered 2018-05-27 – 2018-05-29 (×3): 40 mg via ORAL
  Filled 2018-05-27 (×3): qty 1

## 2018-05-27 MED ORDER — ALBUTEROL SULFATE (2.5 MG/3ML) 0.083% IN NEBU: 2.5000 mg | INHALATION_SOLUTION | Freq: Four times a day (QID) | RESPIRATORY_TRACT | Status: DC | PRN

## 2018-05-27 MED ORDER — ROSUVASTATIN CALCIUM 20 MG PO TABS
20.0000 mg | ORAL_TABLET | Freq: Every day | ORAL | Status: DC
  Administered 2018-05-27 – 2018-05-28 (×2): 20 mg via ORAL
  Filled 2018-05-27 (×2): qty 1

## 2018-05-27 MED ORDER — SUCRALFATE 1 G PO TABS
1.0000 g | ORAL_TABLET | Freq: Three times a day (TID) | ORAL | Status: DC
  Administered 2018-05-27 – 2018-05-29 (×7): 1 g via ORAL
  Filled 2018-05-27 (×7): qty 1

## 2018-05-27 MED ORDER — MORPHINE SULFATE (PF) 2 MG/ML IV SOLN
1.0000 mg | INTRAVENOUS | Status: DC | PRN
  Administered 2018-05-27 (×2): 1 mg via INTRAVENOUS
  Filled 2018-05-27 (×3): qty 1

## 2018-05-27 MED ORDER — HYDRALAZINE HCL 20 MG/ML IJ SOLN
5.0000 mg | INTRAMUSCULAR | Status: DC | PRN
  Administered 2018-05-27: 5 mg via INTRAVENOUS
  Filled 2018-05-27: qty 1

## 2018-05-27 NOTE — H&P (Addendum)
History and Physical    [name] KXF:[id number] DOB: 1950/06/21 DOA: 05/26/2018  PCP: Nolene Ebbs, MD   Patient coming from: Home.  Chief Complaint: Abdominal pain.  HPI: [name] is a 68 y.o. female with history of chronic pancreatitis secondary to previous history of alcohol abuse has not had any alcohol for last 1 year recently admitted last month for acute on chronic pancreatitis with history of diabetes mellitus, hypertension, hyperlipidemia and anemia cirrhosis presents to the ER with complaints of worsening abdominal pain.  Patient states he has been in chronic abdominal pain and since her last discharge last month he had some diarrhea for 5 days which resolved after she started taking loperamide and last to 3 days she has not moved her bowels.  Denies any chest pain or productive cough fever chills or any recent contact with patient with COVID-19 or any recent travel.  Her pain was initially in the mid back which started moving towards the front.  Did not have any vomiting.  Pain became worse and patient presented to the ER.  ED Course: In the ER CT scan shows acute on chronic pancreatitis features.  EKG was showing normal sinus rhythm.  AST 49 ALT 78 lipase 20 albumin 2.9 alkaline phosphatase 166 creatinine 1.17 hemoglobin 10.9 WBC 5.8 platelets 205 neutrophils 74%.  Patient was started on fluids pain medications and admitted for acute on chronic pancreatitis per chest x-ray does not show any infiltrates.  Patient was not hypoxic.  Review of Systems: As per HPI, rest all negative.   Past Medical History:  Diagnosis Date  . ANEMIA, CHRONIC DISEASE NEC 03/15/2006  . Carotid artery occlusion   . Cirrhosis ([place])   . Colon polyps   . Constipation due to opioid therapy 06/05/2014  . DEGENERATIVE JOINT DISEASE 05/06/2007   On going for >5 yrs No imaging to confirm Has tried ultram, mobic, neurontin which did not work Ambulance person tried once worked well    . Diabetes mellitus    Type II  . DIABETIC  RETINOPATHY 03/15/2006  . 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 Diastolic dysfunction 69/67/8938   Grade 1 by Echocardiogram 12/13/14  . Elevated liver enzymes   . Heart murmur   . Hemorrhoids   . History of kidney stones   . Hyperlipidemia 04/01/2011  . Hypertension   . Mild AI (aortic insufficiency)   . Moderate aortic regurgitation 09/25/2008   12/13/14 Echocardiogram    . Myalgia   . PAD (peripheral artery disease) ([place])   . PANCREATITIS, CHRONIC 03/29/2006  . Peripheral arterial disease ([place])   . Pneumonia    age [age]  . RENAL INSUFFICIENCY, CHRONIC 03/15/2006  . Tonsillar mass    Left    Past Surgical History:  Procedure Laterality Date  . BREAST SURGERY    . COLONOSCOPY    . DILATION AND CURETTAGE OF UTERUS N/A 06/16/2016   Procedure: DILATATION AND CURETTAGE;  Surgeon: Mora Bellman, MD;  Location: [facility];  Service: Gynecology;  Laterality: N/A;  . HYSTEROSCOPY N/A 06/16/2016   Procedure: HYSTEROSCOPY;  Surgeon: Mora Bellman, MD;  Location: [facility];  Service: Gynecology;  Laterality: N/A;  . MOUTH SURGERY N/A   . POLYPECTOMY N/A 06/16/2016   Procedure: POLYPECTOMY;  Surgeon: Mora Bellman, MD;  Location: [facility];  Service: Gynecology;  Laterality: N/A;     reports that she quit smoking about 14 months ago. Her smoking use included cigarettes. She has a 13.20 pack-year smoking history. She has never  used smokeless tobacco. She reports previous alcohol use. She reports that she does not use drugs.  Allergies  Allergen Reactions  . Atorvastatin Other (See Comments)    Caused muscles in her back to be very sore  . Gabapentin Swelling    Legs and feet swell  . Nicorette [Nicotine] Nausea Only    Family History  Problem Relation Age of Onset  . Dementia Mother   . Colon cancer Neg Hx     Prior to Admission medications   Medication Sig Start Date End Date Taking? Authorizing Provider  amLODipine (NORVASC) 10 MG tablet Take 10 mg by mouth  daily. 06/01/17  Yes [provider]  aspirin EC 81 MG tablet Take 1 tablet (81 mg total) by mouth daily. 04/09/16  Yes Lucious Groves, DO  LINZESS 145 MCG CAPS capsule Take 145 mcg by mouth daily.  03/07/17  Yes [provider]  Multiple Vitamins-Minerals (ONE-A-DAY WOMENS 50+ ADVANTAGE) TABS Take 1 tablet by mouth daily with breakfast.   Yes [provider]  omeprazole (PRILOSEC) 40 MG capsule Take 40 mg by mouth daily. 04/16/18  Yes [provider]  Pancrelipase, Lip-Prot-Amyl, 24000-76000 units CPEP Take 4 capsules (96,000 Units total) by mouth See admin instructions. Take 4 capsules by mouth 2-3 times daily before meals Patient taking differently: Take 2-4 capsules by mouth See admin instructions. Take 4 capsules by mouth two to three times a day with meals and 2 capsules with any snacks 03/10/16  Yes Lucious Groves, DO  PROAIR HFA 108 (90 Base) MCG/ACT inhaler Inhale 2 puffs into the lungs 4 (four) times daily as needed for wheezing or shortness of breath. 04/30/17  Yes [provider]  rosuvastatin (CRESTOR) 20 MG tablet TAKE 1 TABLET BY MOUTH EVERY DAY Patient taking differently: Take 20 mg by mouth daily.  04/04/18  Yes Miquel Dunn, NP  sitaGLIPtin (JANUVIA) 50 MG tablet Take 1 tablet (50 mg total) by mouth daily. 09/10/16  Yes Lucious Groves, DO  sucralfate (CARAFATE) 1 g tablet Take 1 tablet (1 g total) by mouth 3 (three) times daily after meals. 04/22/18  Yes Pyrtle, Lajuan Lines, MD  TRESIBA FLEXTOUCH 100 UNIT/ML SOPN FlexTouch Pen Inject 10 Units into the skin daily. 05/17/18  Yes [provider]  [place] test strip TEST three times a day 11/25/15   Lucious Groves, DO  Blood Glucose Monitoring Suppl (ACCU-CHEK AVIVA PLUS) w/Device KIT Use to check you blood sugar 3 times a day 06/04/15   Oval Linsey, MD  cyclobenzaprine (FLEXERIL) 10 MG tablet Take 1 tablet (10 mg total) by mouth 3 (three) times daily as needed for muscle  spasms. Patient not taking: Reported on 05/26/2018 02/27/18   Virgel Manifold, MD  diclofenac sodium (VOLTAREN) 1 % GEL Apply 4 g topically 3 (three) times daily as needed. Patient not taking: Reported on 05/26/2018 03/13/18   Duffy Bruce, MD  HYDROcodone-acetaminophen [facility]) 5-325 MG tablet Take 1 tablet by mouth every 4 (four) hours as needed for moderate pain. Patient not taking: Reported on 1/0/9604 06/27/07   Delora Fuel, MD  NONFORMULARY OR COMPOUNDED ITEM Apply 1-2 g topically 4 (four) times daily. [facility]  Peripheral Neuropathy Cream- Bupivacaine 1%, Doxepin 3%, Gabapentin 6%, Pentoxifylline 3%, Topiramate 1% Apply 1-2 grams to affected area 3-4 times daily Qty. 120 gm 3 refills Patient not taking: Reported on 05/26/2018 12/03/17   Evelina Bucy, DPM  ondansetron (ZOFRAN) 4 MG tablet Take 1 tablet (4  mg total) by mouth every 6 (six) hours as needed for nausea or vomiting. Patient not taking: Reported on 0/10/3265 02/25/43   Delora Fuel, MD  ranitidine (ZANTAC) 150 MG tablet Take 1 tablet (150 mg total) by mouth daily. Patient not taking: Reported on 05/26/2018 01/18/18   Irene Shipper, MD  sucralfate (CARAFATE) 1 g tablet Take 1 tablet (1 g total) by mouth 4 (four) times daily -  with meals and at bedtime. Patient not taking: Reported on 05/26/2018 03/28/18   Malvin Johns, MD    Physical Exam: Vitals:   05/26/18 2345 05/27/18 0000 05/27/18 0049 05/27/18 0051  BP: (!) 143/69 126/62  139/80  Pulse: 90 80  82  Resp: 16 16  20   Temp:    98.1 F (36.7 C)  TempSrc:    Oral  SpO2: 96% 94%  100%  Height:   5' 5.5" (1.664 m)       Constitutional: Moderately built and nourished. Vitals:   05/26/18 2345 05/27/18 0000 05/27/18 0049 05/27/18 0051  BP: (!) 143/69 126/62  139/80  Pulse: 90 80  82  Resp: 16 16  20   Temp:    98.1 F (36.7 C)  TempSrc:    Oral  SpO2: 96% 94%  100%  Height:   5' 5.5" (1.664 m)    Eyes: Anicteric no pallor. ENMT: No discharge from the ears eyes  nose and mouth. Neck: No mass felt.  No neck rigidity. Respiratory: No rhonchi or crepitations. Cardiovascular: S1-S2 heard. Abdomen: Soft mild epigastric tenderness no guarding or rigidity. Musculoskeletal: No edema. Skin: No rash. Neurologic: Alert awake oriented to time place and person.  Moves all extremities. Psychiatric: Appears normal per normal affect.   Labs on Admission: I have personally reviewed following labs and imaging studies  CBC: Recent Labs  Lab 05/25/18 1331 05/26/18 2125  WBC  --  5.8  NEUTROABS  --  4.2  HGB 9.2* 10.9*  HCT  --  34.9*  MCV  --  87.3  PLT  --  809   Basic Metabolic Panel: Recent Labs  Lab 05/26/18 2125  NA 133*  K 4.6  CL 106  CO2 22  GLUCOSE 144*  BUN 30*  CREATININE 1.17*  CALCIUM 8.9   GFR: CrCl cannot be calculated (Unknown ideal weight.). Liver Function Tests: Recent Labs  Lab 05/26/18 2125  AST 49*  ALT 78*  ALKPHOS 166*  BILITOT 0.4  PROT 8.4*  ALBUMIN 2.9*   Recent Labs  Lab 05/26/18 2125  LIPASE 20   No results for input(s): AMMONIA in the last 168 hours. Coagulation Profile: No results for input(s): INR, PROTIME in the last 168 hours. Cardiac Enzymes: No results for input(s): CKTOTAL, CKMB, CKMBINDEX, TROPONINI in the last 168 hours. BNP (last 3 results) No results for input(s): PROBNP in the last 8760 hours. HbA1C: No results for input(s): HGBA1C in the last 72 hours. CBG: Recent Labs  Lab 05/27/18 0051  GLUCAP 79   Lipid Profile: No results for input(s): CHOL, HDL, LDLCALC, TRIG, CHOLHDL, LDLDIRECT in the last 72 hours. Thyroid Function Tests: No results for input(s): TSH, T4TOTAL, FREET4, T3FREE, THYROIDAB in the last 72 hours. Anemia Panel: No results for input(s): VITAMINB12, FOLATE, FERRITIN, TIBC, IRON, RETICCTPCT in the last 72 hours. Urine analysis:    Component Value Date/Time   COLORURINE YELLOW 05/26/2018 2126   APPEARANCEUR CLEAR 05/26/2018 2126   LABSPEC 1.013 05/26/2018  2126   PHURINE 5.0 05/26/2018 2126   GLUCOSEU NEGATIVE 05/26/2018  2126   GLUCOSEU NEG mg/dL 04/05/2007 1803   HGBUR NEGATIVE 05/26/2018 2126   [place] NEGATIVE 05/26/2018 2126   [place] NEGATIVE 05/26/2018 2126   PROTEINUR 30 (A) 05/26/2018 2126   UROBILINOGEN 0.2 11/06/2014 0005   NITRITE NEGATIVE 05/26/2018 2126   LEUKOCYTESUR NEGATIVE 05/26/2018 2126   Sepsis Labs: @LABRCNTIP (procalcitonin:4,lacticidven:4) )No results found for this or any previous visit (from the past 240 hour(s)).   Radiological Exams on Admission: Ct Abdomen Pelvis W Contrast  Result Date: 05/26/2018 CLINICAL DATA:  Back/abdominal pain, chronic pancreatitis EXAM: CT ABDOMEN AND PELVIS WITH CONTRAST TECHNIQUE: Multidetector CT imaging of the abdomen and pelvis was performed using the standard protocol following bolus administration of intravenous contrast. CONTRAST:  18m OMNIPAQUE IOHEXOL 300 MG/ML  SOLN COMPARISON:  03/31/2018 FINDINGS: Lower chest: Scarring/atelectasis in the bilateral lower lobes. Hepatobiliary: Liver is notable for mildly heterogeneous enhancement. Status post cholecystectomy. No intrahepatic ductal dilatation. Common duct measures 16 mm and tapers at the ampulla. Pancreas: Enlargement of the pancreatic head/uncinate process (series 3/image 33), new from recent CT, suggesting acute pancreatitis. Associated diffuse parenchymal calcifications, reflecting sequela of prior/chronic pancreatitis. Mild atrophy the pancreatic body/tail. Mild peripancreatic stranding/fluid. No drainable fluid collection/pseudocyst. Spleen: Within normal limits. Adrenals/Urinary Tract: Adrenal glands are within normal limits. Bilateral renal cysts.  No hydronephrosis. Bladder is within normal limits. Stomach/Bowel: Stomach is within normal limits. No evidence of bowel obstruction. Normal appendix (series 3/image 52). Mild to moderate colonic stool burden. Vascular/Lymphatic: No evidence of abdominal aortic aneurysm.  Atherosclerotic calcifications of the abdominal aorta and branch vessels. No suspicious abdominopelvic lymphadenopathy. Reproductive: Uterus is within normal limits. No adnexal masses. Other: No abdominopelvic ascites. Tiny fat containing right inguinal hernia. Musculoskeletal: Degenerative changes at L4-5. IMPRESSION: Suspected acute on chronic pancreatitis, as above. Follow-up MRI abdomen is suggested in 6 weeks. Additional ancillary findings as above. Electronically Signed   By: SJulian HyM.D.   On: 05/26/2018 23:07   Dg Chest Port 1 View  Result Date: 05/27/2018 CLINICAL DATA:  Epigastric pain EXAM: PORTABLE CHEST 1 VIEW COMPARISON:  CTA chest dated 03/13/2018 FINDINGS: Bibasilar scarring/atelectasis. No focal consolidation. No pleural effusion or pneumothorax. The heart is normal in size. IMPRESSION: No evidence of acute cardiopulmonary disease. Electronically Signed   By: SJulian HyM.D.   On: 05/27/2018 00:52    EKG: Independently reviewed.  Normal sinus rhythm.  Assessment/Plan Active Problems:   Anemia of chronic disease   Essential hypertension   CKD stage 3 due to type 2 diabetes mellitus ([place]   Acute pancreatitis   Diabetes mellitus type 2 in obese ([facility])   Acute on chronic pancreatitis ([place]    1. Acute on chronic pancreatitis -patient has had previous history of alcohol abuse which patient states he has not had any alcohol for last 1 year.  For now I have placed patient on clear liquids pain only medication IV fluids.  Continue Creon and PPI. 2. Diabetes mellitus type 2 -patient states he takes Lantus only if blood sugars more than 130 or 140.  For now we will keep patient on sliding scale coverage. 3. Hypertension we will keep patient on PRN IV hydralazine hold amlodipine for now. 4. Chronic kidney disease stage III creatinine appears to be at baseline. 5. Anemia likely from renal disease follow CBC appears to be chronic. 6. Hyperlipidemia continue statins. 7.  History of cirrhosis per the chart and mildly elevated LFTs -CAT scan does not show any obvious ascites at this time.  Follow LFTs.   DVT  prophylaxis: Lovenox. Code Status: Full code. Family Communication: Discussed with patient. Disposition Plan: Home. Consults called: None. Admission status: Observation.   Rise Patience MD [facility] Pager [phone].  If 7PM-7AM, please contact night-coverage [email or web address] Password [email or web address]  05/27/2018, 1:25 AM

## 2018-05-27 NOTE — Progress Notes (Signed)
Patient admitted after midnight, please see H&P.  Here with acute pancreatitis.  IVF, clears, pain medications.  Suspect will be here for several days. Eulogio Bear DO

## 2018-05-27 NOTE — Progress Notes (Signed)
New Admission Note:   Arrival Method: Arrived from ED via stretcher Mental Orientation: Alert and oriented x4 Telemetry:  Box #19 Assessment: Completed Skin: See doc flowsheet IV: NSL-Lt W Pain: Denies Tubes: N/A Safety Measures: Safety Fall Prevention Plan has been discussed.  Admission: Completed 5MW Orientation: Patient has been orientated to the room, unit and staff.  Family: None at bedside  Orders have been reviewed and implemented. Will continue to monitor the patient. Call light has been placed within reach and bed alarm has been activated.   [name] [employer], RN-BC Phone number: [phone]

## 2018-05-27 NOTE — ED Notes (Signed)
ED TO INPATIENT HANDOFF REPORT  ED Nurse Name and Phone #: Clydene Laming [phone]  S Name/Age/Gender [name] 68 y.o. female Room/Bed: 016C/016C  Code Status:  FULL   Home/SNF/Other: HOME  {Patient oriented to: Person /Time/ Place / Situation  Is this baseline? YES  Triage Complete: Triage complete  Chief Complaint stomach, back pain  Triage Note Pt states she had an injection for her kidneys yesterday and then began to have back pain today. Pt also reports abdominal pain. AOX4. Skin warm and dry.    Allergies Allergies  Allergen Reactions  . Atorvastatin Other (See Comments)    Caused muscles in her back to be very sore  . Gabapentin Swelling    Legs and feet swell  . Nicorette [Nicotine] Nausea Only    Level of Care/Admitting Diagnosis ED Disposition    ED Disposition Condition [facility] Area: [place] [[id number]]  Level of Care: Telemetry Medical [104]  I expect the patient will be discharged within 24 hours: Yes  LOW acuity---Tx typically complete <24 hrs---ACUTE conditions typically can be evaluated <24 hours---LABS likely to return to acceptable levels <24 hours---IS near functional baseline---EXPECTED to return to current living arrangement---NOT newly hypoxic: Meets criteria for 5C-Observation unit  Diagnosis: Acute pancreatitis [577.0.ICD-9-CM]  Admitting Physician: Rise Patience [phone]  Attending Physician: Rise Patience [phone]  PT Class (Do Not Modify): Observation [104]  PT Acc Code (Do Not Modify): Observation [10022]       B Medical/Surgery History Past Medical History:  Diagnosis Date  . ANEMIA, CHRONIC DISEASE NEC 03/15/2006  . Carotid artery occlusion   . Cirrhosis ([place])   . Colon polyps   . Constipation due to opioid therapy 06/05/2014  . DEGENERATIVE JOINT DISEASE 05/06/2007   On going for >5 yrs No imaging to confirm Has tried ultram, mobic, neurontin which did not work Ambulance person tried once  worked well    . Diabetes mellitus    Type II  . DIABETIC  RETINOPATHY 03/15/2006  . 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 Diastolic dysfunction 82/80/0349   Grade 1 by Echocardiogram 12/13/14  . Elevated liver enzymes   . Heart murmur   . Hemorrhoids   . History of kidney stones   . Hyperlipidemia 04/01/2011  . Hypertension   . Mild AI (aortic insufficiency)   . Moderate aortic regurgitation 09/25/2008   12/13/14 Echocardiogram    . Myalgia   . PAD (peripheral artery disease) ([place])   . PANCREATITIS, CHRONIC 03/29/2006  . Peripheral arterial disease ([place])   . Pneumonia    age 76-21  . RENAL INSUFFICIENCY, CHRONIC 03/15/2006  . Tonsillar mass    Left   Past Surgical History:  Procedure Laterality Date  . BREAST SURGERY    . COLONOSCOPY    . DILATION AND CURETTAGE OF UTERUS N/A 06/16/2016   Procedure: DILATATION AND CURETTAGE;  Surgeon: Mora Bellman, MD;  Location: [facility];  Service: Gynecology;  Laterality: N/A;  . HYSTEROSCOPY N/A 06/16/2016   Procedure: HYSTEROSCOPY;  Surgeon: Mora Bellman, MD;  Location: [facility];  Service: Gynecology;  Laterality: N/A;  . MOUTH SURGERY N/A   . POLYPECTOMY N/A 06/16/2016   Procedure: POLYPECTOMY;  Surgeon: Mora Bellman, MD;  Location: [facility];  Service: Gynecology;  Laterality: N/A;     A IV Location/Drains/Wounds Patient Lines/Drains/Airways Status   Active Line/Drains/Airways    Name:   Placement date:   Placement time:   Site:   Days:  Peripheral IV 05/26/18 Left;Posterior Wrist   05/26/18    2148    Wrist   1   Incision (Closed) 06/16/16 Vagina   06/16/16    1052     710   Wound / Incision (Open or Dehisced) 04/01/18 Diabetic ulcer Toe (Comment  which one) Right;Lateral Black, no drainage, pt states she is having this removed once cleared by her cardiologist   04/01/18    0800    Toe (Comment  which one)   56          Intake/Output Last 24 hours No intake or output data in the 24 hours ending 05/27/18  0003  Labs/Imaging Results for orders placed or performed during the hospital encounter of 05/26/18 (from the past 48 hour(s))  CBC with Differential     Status: Abnormal   Collection Time: 05/26/18  9:25 PM  Result Value Ref Range   WBC 5.8 4.0 - 10.5 K/uL   RBC 4.00 3.87 - 5.11 MIL/uL   Hemoglobin 10.9 (L) 12.0 - 15.0 g/dL   HCT 34.9 (L) 36.0 - 46.0 %   MCV 87.3 80.0 - 100.0 fL   MCH 27.3 26.0 - 34.0 pg   MCHC 31.2 30.0 - 36.0 g/dL   RDW 15.8 (H) 11.5 - 15.5 %   Platelets 205 150 - 400 K/uL   nRBC 0.0 0.0 - 0.2 %   Neutrophils Relative % 74 %   Neutro Abs 4.2 1.7 - 7.7 K/uL   Lymphocytes Relative 17 %   Lymphs Abs 1.0 0.7 - 4.0 K/uL   Monocytes Relative 9 %   Monocytes Absolute 0.5 0.1 - 1.0 K/uL   Eosinophils Relative 0 %   Eosinophils Absolute 0.0 0.0 - 0.5 K/uL   Basophils Relative 0 %   Basophils Absolute 0.0 0.0 - 0.1 K/uL   Immature Granulocytes 0 %   Abs Immature Granulocytes 0.02 0.00 - 0.07 K/uL    Comment: Performed at [facility], [address] [place], [place] [address]  Comprehensive metabolic panel     Status: Abnormal   Collection Time: 05/26/18  9:25 PM  Result Value Ref Range   Sodium 133 (L) 135 - 145 mmol/L   Potassium 4.6 3.5 - 5.1 mmol/L   Chloride 106 98 - 111 mmol/L   CO2 22 22 - 32 mmol/L   Glucose, Bld 144 (H) 70 - 99 mg/dL   BUN 30 (H) 8 - 23 mg/dL   Creatinine, Ser 1.17 (H) 0.44 - 1.00 mg/dL   Calcium 8.9 8.9 - 10.3 mg/dL   Total Protein 8.4 (H) 6.5 - 8.1 g/dL   Albumin 2.9 (L) 3.5 - 5.0 g/dL   AST 49 (H) 15 - 41 U/L   ALT 78 (H) 0 - 44 U/L   Alkaline Phosphatase 166 (H) 38 - 126 U/L   Total Bilirubin 0.4 0.3 - 1.2 mg/dL   GFR calc non Af Amer 49 (L) >60 mL/min   GFR calc Af Amer 56 (L) >60 mL/min   Anion gap 5 5 - 15    Comment: Performed at [facility], [place] [address], [place], [place] [address]  Lipase, blood     Status: None   Collection Time: 05/26/18  9:25 PM  Result Value Ref Range   Lipase 20 11 - 51 U/L     Comment: Performed at [facility], [place] [address] [place], [place] [address]  Urinalysis, Routine w reflex microscopic  Status: Abnormal   Collection Time: 05/26/18  9:26 PM  Result Value Ref Range   Color, Urine YELLOW YELLOW   APPearance CLEAR CLEAR   Specific Gravity, Urine 1.013 1.005 - 1.030   pH 5.0 5.0 - 8.0   Glucose, UA NEGATIVE NEGATIVE mg/dL   Hgb urine dipstick NEGATIVE NEGATIVE   Bilirubin Urine NEGATIVE NEGATIVE   Ketones, ur NEGATIVE NEGATIVE mg/dL   Protein, ur 30 (A) NEGATIVE mg/dL   Nitrite NEGATIVE NEGATIVE   Leukocytes,Ua NEGATIVE NEGATIVE   RBC / HPF 0-5 0 - 5 RBC/hpf   WBC, UA 0-5 0 - 5 WBC/hpf   Bacteria, UA NONE SEEN NONE SEEN   Squamous Epithelial / LPF 0-5 0 - 5    Comment: Performed at [facility], [place] [address], [place], [place] [address]   Ct Abdomen Pelvis W Contrast  Result Date: 05/26/2018 CLINICAL DATA:  Back/abdominal pain, chronic pancreatitis EXAM: CT ABDOMEN AND PELVIS WITH CONTRAST TECHNIQUE: Multidetector CT imaging of the abdomen and pelvis was performed using the standard protocol following bolus administration of intravenous contrast. CONTRAST:  145mL OMNIPAQUE IOHEXOL 300 MG/ML  SOLN COMPARISON:  03/31/2018 FINDINGS: Lower chest: Scarring/atelectasis in the bilateral lower lobes. Hepatobiliary: Liver is notable for mildly heterogeneous enhancement. Status post cholecystectomy. No intrahepatic ductal dilatation. Common duct measures 16 mm and tapers at the ampulla. Pancreas: Enlargement of the pancreatic head/uncinate process (series 3/image 33), new from recent CT, suggesting acute pancreatitis. Associated diffuse parenchymal calcifications, reflecting sequela of prior/chronic pancreatitis. Mild atrophy the pancreatic body/tail. Mild peripancreatic stranding/fluid. No drainable fluid collection/pseudocyst. Spleen: Within normal limits. Adrenals/Urinary Tract: Adrenal glands are within normal limits. Bilateral renal cysts.  No  hydronephrosis. Bladder is within normal limits. Stomach/Bowel: Stomach is within normal limits. No evidence of bowel obstruction. Normal appendix (series 3/image 52). Mild to moderate colonic stool burden. Vascular/Lymphatic: No evidence of abdominal aortic aneurysm. Atherosclerotic calcifications of the abdominal aorta and branch vessels. No suspicious abdominopelvic lymphadenopathy. Reproductive: Uterus is within normal limits. No adnexal masses. Other: No abdominopelvic ascites. Tiny fat containing right inguinal hernia. Musculoskeletal: Degenerative changes at L4-5. IMPRESSION: Suspected acute on chronic pancreatitis, as above. Follow-up MRI abdomen is suggested in 6 weeks. Additional ancillary findings as above. Electronically Signed   By: Julian Hy M.D.   On: 05/26/2018 23:07    Pending Labs Unresulted Labs (From admission, onward)    Start     Ordered   05/26/18 2126  Urine culture  ONCE - STAT,   STAT     05/26/18 2127          Vitals/Pain Today's Vitals   05/26/18 2215 05/26/18 2230 05/26/18 2333 05/26/18 2345  BP: 137/62 (!) 146/69    Pulse: 85 91    Resp: 16 17    Temp:      TempSrc:      SpO2: 97% 100%    PainSc:   8  7     Isolation Precautions No active isolations  Medications Medications  lactated ringers bolus 1,000 mL (0 mLs Intravenous Stopped 05/26/18 2336)  morphine 4 MG/ML injection 4 mg (4 mg Intravenous Given 05/26/18 2207)  ondansetron (ZOFRAN) injection 4 mg (4 mg Intravenous Given 05/26/18 2208)  iohexol (OMNIPAQUE) 300 MG/ML solution 100 mL (100 mLs Intravenous Contrast Given 05/26/18 2245)  morphine 4 MG/ML injection 4 mg (4 mg Intravenous Given 05/26/18 2336)    Mobility walks Low fall risk   Focused Assessments NO FAMILY PRESENT   R Recommendations: See Admitting Provider  Note  Report given to:   Additional Notes:

## 2018-05-27 NOTE — Progress Notes (Signed)
Received report from [place]. Room ready for patient. [name], [name], Therapist, sports

## 2018-05-28 DIAGNOSIS — I10 Essential (primary) hypertension: Secondary | ICD-10-CM

## 2018-05-28 DIAGNOSIS — E1169 Type 2 diabetes mellitus with other specified complication: Secondary | ICD-10-CM | POA: Diagnosis not present

## 2018-05-28 DIAGNOSIS — K859 Acute pancreatitis without necrosis or infection, unspecified: Secondary | ICD-10-CM | POA: Diagnosis not present

## 2018-05-28 DIAGNOSIS — R1013 Epigastric pain: Secondary | ICD-10-CM | POA: Diagnosis not present

## 2018-05-28 DIAGNOSIS — K861 Other chronic pancreatitis: Secondary | ICD-10-CM | POA: Diagnosis not present

## 2018-05-28 LAB — COMPREHENSIVE METABOLIC PANEL
ALT: 52 U/L — ABNORMAL HIGH (ref 0–44)
AST: 35 U/L (ref 15–41)
Albumin: 2.2 g/dL — ABNORMAL LOW (ref 3.5–5.0)
Alkaline Phosphatase: 134 U/L — ABNORMAL HIGH (ref 38–126)
Anion gap: 9 (ref 5–15)
BUN: 15 mg/dL (ref 8–23)
CO2: 24 mmol/L (ref 22–32)
Calcium: 8.6 mg/dL — ABNORMAL LOW (ref 8.9–10.3)
Chloride: 105 mmol/L (ref 98–111)
Creatinine, Ser: 0.97 mg/dL (ref 0.44–1.00)
GFR calc Af Amer: >60 mL/min (ref >60)
GFR calc non Af Amer: >60 mL/min (ref >60)
Glucose, Bld: 62 mg/dL — ABNORMAL LOW (ref 70–99)
Potassium: 3.9 mmol/L (ref 3.5–5.1)
Sodium: 138 mmol/L (ref 135–145)
Total Bilirubin: 0.4 mg/dL (ref 0.3–1.2)
Total Protein: 6.7 g/dL (ref 6.5–8.1)

## 2018-05-28 LAB — GLUCOSE, CAPILLARY
Glucose-Capillary: 122 mg/dL — ABNORMAL HIGH (ref 70–99)
Glucose-Capillary: 91 mg/dL (ref 70–99)
Glucose-Capillary: 273 mg/dL — ABNORMAL HIGH (ref 70–99)
Glucose-Capillary: 100 mg/dL — ABNORMAL HIGH (ref 70–99)

## 2018-05-28 LAB — CBC
HCT: 29.9 % — ABNORMAL LOW (ref 36.0–46.0)
Hemoglobin: 9.6 g/dL — ABNORMAL LOW (ref 12.0–15.0)
MCH: 27.4 pg (ref 26.0–34.0)
MCHC: 32.1 g/dL (ref 30.0–36.0)
MCV: 85.2 fL (ref 80.0–100.0)
Platelets: 187 10*3/uL (ref 150–400)
RBC: 3.51 MIL/uL — ABNORMAL LOW (ref 3.87–5.11)
RDW: 15.7 % — ABNORMAL HIGH (ref 11.5–15.5)
WBC: 2.4 10*3/uL — ABNORMAL LOW (ref 4.0–10.5)
nRBC: 0 % (ref 0.0–0.2)

## 2018-05-28 MED ORDER — TRAMADOL HCL 50 MG PO TABS
50.0000 mg | ORAL_TABLET | Freq: Four times a day (QID) | ORAL | Status: DC | PRN
  Administered 2018-05-28 (×2): 50 mg via ORAL
  Filled 2018-05-28 (×2): qty 1

## 2018-05-28 NOTE — Progress Notes (Signed)
Progress Note    MEILING HENDRIKS  CBJ:[id number] DOB: 09-Jun-1950  DOA: 05/26/2018 PCP: Nolene Ebbs, MD    Brief Narrative:     Medical records reviewed and are as summarized below:  [name] is an 68 y.o. female with history of chronic pancreatitis secondary to previous history of alcohol abuse has not had any alcohol for last 1 year recently admitted last month for acute on chronic pancreatitis.  Assessment/Plan:   Active Problems:   Anemia of chronic disease   Essential hypertension   CKD stage 3 due to type 2 diabetes mellitus (HCC)   Acute pancreatitis   Diabetes mellitus type 2 in obese (HCC)   Acute on chronic pancreatitis (HCC)  Acute on chronic pancreatitis --patient has had previous history of alcohol abuse  -Continue Creon and PPI. -advance diet slowly -will need repeat MRI in 6 weeks  Diabetes mellitus type 2 -patient states he takes Lantus only if blood sugars more than 130 or 140.  For now we will keep patient on sliding scale coverage.  Hypertension  - hold amlodipine for now.  Chronic kidney disease stage III -Cr improved from baseline  Anemia likely from renal disease -trend H/H -no signs of bleeding -outpatient follow up  Hyperlipidemia  -continue statins.  History of cirrhosis -LFTs trending down  Family Communication/Anticipated D/C date and plan/Code Status   DVT prophylaxis: Lovenox ordered. Code Status: Full Code.  Family Communication:  Disposition Plan: advance diet today and home in AM if tolerates   Medical Consultants:    None.     Subjective:   Tolerating clears, still having pain  Objective:    Vitals:   05/27/18 1041 05/27/18 1703 05/27/18 1952 05/28/18 0459  BP: (!) 133/58 (!) 164/70 (!) 149/58 137/62  Pulse: 61 (!) 59 68 (!) 56  Resp: 20 20 20 20   Temp: 98.3 F (36.8 C) 98.4 F (36.9 C) 98.3 F (36.8 C) 98.3 F (36.8 C)  TempSrc: Oral Oral Oral Oral  SpO2: 100% 100% 99% 100%  Weight:   61.3  kg   Height:        Intake/Output Summary (Last 24 hours) at 05/28/2018 0820 Last data filed at 05/28/2018 0500 Gross per 24 hour  Intake 300 ml  Output 3300 ml  Net -3000 ml   Filed Weights   05/27/18 0049 05/27/18 1952  Weight: 61.6 kg 61.3 kg    Exam: Sitting on side of bed NAD +BS, soft, minimal tenderness A+Ox3 No increased work of breathing, lungs clear  Data Reviewed:   I have personally reviewed following labs and imaging studies:  Labs: Labs show the following:   Basic Metabolic Panel: Recent Labs  Lab 05/26/18 2125 05/28/18 0439  NA 133* 138  K 4.6 3.9  CL 106 105  CO2 22 24  GLUCOSE 144* 62*  BUN 30* 15  CREATININE 1.17* 0.97  CALCIUM 8.9 8.6*   GFR Estimated Creatinine Clearance: 52.4 mL/min (by C-G formula based on SCr of 0.97 mg/dL). Liver Function Tests: Recent Labs  Lab 05/26/18 2125 05/27/18 0945 05/28/18 0439  AST 49* 52* 35  ALT 78* 63* 52*  ALKPHOS 166* 145* 134*  BILITOT 0.4 0.4 0.4  PROT 8.4* 6.9 6.7  ALBUMIN 2.9* 2.4* 2.2*   Recent Labs  Lab 05/26/18 2125  LIPASE 20   No results for input(s): AMMONIA in the last 168 hours. Coagulation profile No results for input(s): INR, PROTIME in the last 168 hours.  CBC: Recent Labs  Lab 05/25/18 1331 05/26/18 2125 05/28/18 0439  WBC  --  5.8 2.4*  NEUTROABS  --  4.2  --   HGB 9.2* 10.9* 9.6*  HCT  --  34.9* 29.9*  MCV  --  87.3 85.2  PLT  --  205 187   Cardiac Enzymes: No results for input(s): CKTOTAL, CKMB, CKMBINDEX, TROPONINI in the last 168 hours. BNP (last 3 results) No results for input(s): PROBNP in the last 8760 hours. CBG: Recent Labs  Lab 05/27/18 1703 05/27/18 1725 05/27/18 1732 05/27/18 1952 05/28/18 0709  GLUCAP 55* 74 99 209* 122*   D-Dimer: No results for input(s): DDIMER in the last 72 hours. Hgb A1c: No results for input(s): HGBA1C in the last 72 hours. Lipid Profile: No results for input(s): CHOL, HDL, LDLCALC, TRIG, CHOLHDL, LDLDIRECT in the  last 72 hours. Thyroid function studies: No results for input(s): TSH, T4TOTAL, T3FREE, THYROIDAB in the last 72 hours.  Invalid input(s): FREET3 Anemia work up: No results for input(s): VITAMINB12, FOLATE, FERRITIN, TIBC, IRON, RETICCTPCT in the last 72 hours. Sepsis Labs: Recent Labs  Lab 05/26/18 2125 05/28/18 0439  WBC 5.8 2.4*    Microbiology Recent Results (from the past 240 hour(s))  Urine culture     Status: Abnormal   Collection Time: 05/26/18  9:41 PM  Result Value Ref Range Status   Specimen Description URINE, CLEAN CATCH  Final   Special Requests   Final    NONE Performed at [facility], [place] [address], [place], [place] [address]    Culture MULTIPLE SPECIES PRESENT, SUGGEST RECOLLECTION (A)  Final   Report Status 05/27/2018 FINAL  Final    Procedures and diagnostic studies:  Ct Abdomen Pelvis W Contrast  Result Date: 05/26/2018 CLINICAL DATA:  Back/abdominal pain, chronic pancreatitis EXAM: CT ABDOMEN AND PELVIS WITH CONTRAST TECHNIQUE: Multidetector CT imaging of the abdomen and pelvis was performed using the standard protocol following bolus administration of intravenous contrast. CONTRAST:  18mL OMNIPAQUE IOHEXOL 300 MG/ML  SOLN COMPARISON:  03/31/2018 FINDINGS: Lower chest: Scarring/atelectasis in the bilateral lower lobes. Hepatobiliary: Liver is notable for mildly heterogeneous enhancement. Status post cholecystectomy. No intrahepatic ductal dilatation. Common duct measures 16 mm and tapers at the ampulla. Pancreas: Enlargement of the pancreatic head/uncinate process (series 3/image 33), new from recent CT, suggesting acute pancreatitis. Associated diffuse parenchymal calcifications, reflecting sequela of prior/chronic pancreatitis. Mild atrophy the pancreatic body/tail. Mild peripancreatic stranding/fluid. No drainable fluid collection/pseudocyst. Spleen: Within normal limits. Adrenals/Urinary Tract: Adrenal glands are within normal limits. Bilateral renal  cysts.  No hydronephrosis. Bladder is within normal limits. Stomach/Bowel: Stomach is within normal limits. No evidence of bowel obstruction. Normal appendix (series 3/image 52). Mild to moderate colonic stool burden. Vascular/Lymphatic: No evidence of abdominal aortic aneurysm. Atherosclerotic calcifications of the abdominal aorta and branch vessels. No suspicious abdominopelvic lymphadenopathy. Reproductive: Uterus is within normal limits. No adnexal masses. Other: No abdominopelvic ascites. Tiny fat containing right inguinal hernia. Musculoskeletal: Degenerative changes at L4-5. IMPRESSION: Suspected acute on chronic pancreatitis, as above. Follow-up MRI abdomen is suggested in 6 weeks. Additional ancillary findings as above. Electronically Signed   By: Julian Hy M.D.   On: 05/26/2018 23:07   Dg Chest Port 1 View  Result Date: 05/27/2018 CLINICAL DATA:  Epigastric pain EXAM: PORTABLE CHEST 1 VIEW COMPARISON:  CTA chest dated 03/13/2018 FINDINGS: Bibasilar scarring/atelectasis. No focal consolidation. No pleural effusion or pneumothorax. The heart is normal in size. IMPRESSION: No evidence of acute cardiopulmonary disease. Electronically Signed  By: Julian Hy M.D.   On: 05/27/2018 00:52    Medications:   . enoxaparin (LOVENOX) injection  40 mg Subcutaneous Daily  . insulin aspart  0-9 Units Subcutaneous TID WC  . lipase/protease/amylase  24,000 Units Oral TID AC  . pantoprazole  40 mg Oral Daily  . rosuvastatin  20 mg Oral q1800  . sucralfate  1 g Oral TID PC   Continuous Infusions:   LOS: 1 day   Geradine Girt  [facility]   How to contact the [facility] Attending or Consulting provider Comfort or covering provider during after hours [place], for this patient?  1. Check the care team in [facility] and look for a) attending/consulting [facility] provider listed and b) the [facility] team listed 2. Log into [email or web address] and use 's universal password to access. If you do not have the  password, please contact the hospital operator. 3. Locate the [facility] provider you are looking for under [facility] and page to a number that you can be directly reached. 4. If you still have difficulty reaching the provider, please page the [facility] (Director on Call) for the Hospitalists listed on amion for assistance.  05/28/2018, 8:20 AM

## 2018-05-29 DIAGNOSIS — K859 Acute pancreatitis without necrosis or infection, unspecified: Secondary | ICD-10-CM | POA: Diagnosis not present

## 2018-05-29 DIAGNOSIS — K861 Other chronic pancreatitis: Secondary | ICD-10-CM | POA: Diagnosis not present

## 2018-05-29 DIAGNOSIS — R1013 Epigastric pain: Secondary | ICD-10-CM | POA: Diagnosis not present

## 2018-05-29 LAB — GLUCOSE, CAPILLARY
Glucose-Capillary: 84 mg/dL (ref 70–99)
Glucose-Capillary: 186 mg/dL — ABNORMAL HIGH (ref 70–99)

## 2018-05-29 MED ORDER — TRAMADOL HCL 50 MG PO TABS: 50.0000 mg | ORAL_TABLET | Freq: Four times a day (QID) | ORAL | 0 refills | Status: DC | PRN

## 2018-05-29 MED ORDER — BISACODYL 10 MG RE SUPP
10.0000 mg | Freq: Once | RECTAL | Status: AC
  Administered 2018-05-29: 10 mg via RECTAL
  Filled 2018-05-29: qty 1

## 2018-05-29 NOTE — Discharge Summary (Signed)
Physician Discharge Summary  [name] JAS:[id number] DOB: 13-Sep-1950 DOA: 05/26/2018  PCP: Nolene Ebbs, MD  Admit date: 05/26/2018 Discharge date: 05/29/2018  Admitted From: home Discharge disposition: home   Recommendations for Outpatient Follow-Up:   1. MRI 6 week of abd/pancrease 2. Cbc/cmp 2 weeks   Discharge Diagnosis:   Active Problems:   Anemia of chronic disease   Essential hypertension   CKD stage 3 due to type 2 diabetes mellitus (HCC)   Acute pancreatitis   Diabetes mellitus type 2 in obese (HCC)   Acute on chronic pancreatitis [facility])    Discharge Condition: Improved.  Diet recommendation: Low sodium, heart healthy.  Carbohydrate-modified.  Regular.  Wound care: None.  Code status: Full.   History of Present Illness:  [name] is an 68 y.o. female withhistory of chronic pancreatitis secondary to previous history of alcohol abuse has not had any alcohol for last 1 year recently admitted last month for acute on chronic pancreatitis   Hospital Course by Problem:   Acute on chronic pancreatitis --patient has had previous history of alcohol abuse  -Continue Creon and PPI. -will need repeat MRI in 6 weeks  Diabetes mellitus type 2-patient states he takes Lantus only if blood sugars more than 130 or 140.   Hypertension  - resume home meds  Chronic kidney disease stage III -Cr improved from baseline  Anemia likely from renal disease -trend H/H -no signs of bleeding -outpatient follow up  Hyperlipidemia  -continue statins.  History of cirrhosis -LFTs trending down    Medical Consultants:      Discharge Exam:   Vitals:   05/29/18 0456 05/29/18 0904  BP: (!) 166/84 (!) 156/55  Pulse: 65 69  Resp: 18 18  Temp: 98.5 F (36.9 C) 98.5 F (36.9 C)  SpO2: 97% 100%   Vitals:   05/28/18 1636 05/28/18 2041 05/29/18 0456 05/29/18 0904  BP: (!) 147/75 (!) 113/58 (!) 166/84 (!) 156/55  Pulse: 73 61 65 69  Resp: _0 Temp: 98 F (36.7 C) 98.6 F (37 C) 98.5 F (36.9 C) 98.5 F (36.9 C)  TempSrc: Oral Oral Oral Oral  SpO2: 97% 100% 97% 100%  Weight:  61.3 kg    Height:        General exam: Appears calm and comfortable.    The results of significant diagnostics from this hospitalization (including imaging, microbiology, ancillary and laboratory) are listed below for reference.     Procedures and Diagnostic Studies:   Ct Abdomen Pelvis W Contrast  Result Date: 05/26/2018 CLINICAL DATA:  Back/abdominal pain, chronic pancreatitis EXAM: CT ABDOMEN AND PELVIS WITH CONTRAST TECHNIQUE: Multidetector CT imaging of the abdomen and pelvis was performed using the standard protocol following bolus administration of intravenous contrast. CONTRAST:  145m OMNIPAQUE IOHEXOL 300 MG/ML  SOLN COMPARISON:  03/31/2018 FINDINGS: Lower chest: Scarring/atelectasis in the bilateral lower lobes. Hepatobiliary: Liver is notable for mildly heterogeneous enhancement. Status post cholecystectomy. No intrahepatic ductal dilatation. Common duct measures 16 mm and tapers at the ampulla. Pancreas: Enlargement of the pancreatic head/uncinate process (series 3/image 33), new from recent CT, suggesting acute pancreatitis. Associated diffuse parenchymal calcifications, reflecting sequela of prior/chronic pancreatitis. Mild atrophy the pancreatic body/tail. Mild peripancreatic stranding/fluid. No drainable fluid collection/pseudocyst. Spleen: Within normal limits. Adrenals/Urinary Tract: Adrenal glands are within normal limits. Bilateral renal cysts.  No hydronephrosis. Bladder is within normal limits. Stomach/Bowel: Stomach is within normal limits. No evidence of bowel obstruction. Normal appendix (  series 3/image 52). Mild to moderate colonic stool burden. Vascular/Lymphatic: No evidence of abdominal aortic aneurysm. Atherosclerotic calcifications of the abdominal aorta and branch vessels. No suspicious abdominopelvic lymphadenopathy.  Reproductive: Uterus is within normal limits. No adnexal masses. Other: No abdominopelvic ascites. Tiny fat containing right inguinal hernia. Musculoskeletal: Degenerative changes at L4-5. IMPRESSION: Suspected acute on chronic pancreatitis, as above. Follow-up MRI abdomen is suggested in 6 weeks. Additional ancillary findings as above. Electronically Signed   By: Julian Hy M.D.   On: 05/26/2018 23:07   Dg Chest Port 1 View  Result Date: 05/27/2018 CLINICAL DATA:  Epigastric pain EXAM: PORTABLE CHEST 1 VIEW COMPARISON:  CTA chest dated 03/13/2018 FINDINGS: Bibasilar scarring/atelectasis. No focal consolidation. No pleural effusion or pneumothorax. The heart is normal in size. IMPRESSION: No evidence of acute cardiopulmonary disease. Electronically Signed   By: Julian Hy M.D.   On: 05/27/2018 00:52     Labs:   Basic Metabolic Panel: Recent Labs  Lab 05/26/18 2125 05/28/18 0439  NA 133* 138  K 4.6 3.9  CL 106 105  CO2 22 24  GLUCOSE 144* 62*  BUN 30* 15  CREATININE 1.17* 0.97  CALCIUM 8.9 8.6*   GFR Estimated Creatinine Clearance: 52.4 mL/min (by C-G formula based on SCr of 0.97 mg/dL). Liver Function Tests: Recent Labs  Lab 05/26/18 2125 05/27/18 0945 05/28/18 0439  AST 49* 52* 35  ALT 78* 63* 52*  ALKPHOS 166* 145* 134*  BILITOT 0.4 0.4 0.4  PROT 8.4* 6.9 6.7  ALBUMIN 2.9* 2.4* 2.2*   Recent Labs  Lab 05/26/18 2125  LIPASE 20   No results for input(s): AMMONIA in the last 168 hours. Coagulation profile No results for input(s): INR, PROTIME in the last 168 hours.  CBC: Recent Labs  Lab 05/25/18 1331 05/26/18 2125 05/28/18 0439  WBC  --  5.8 2.4*  NEUTROABS  --  4.2  --   HGB 9.2* 10.9* 9.6*  HCT  --  34.9* 29.9*  MCV  --  87.3 85.2  PLT  --  205 187   Cardiac Enzymes: No results for input(s): CKTOTAL, CKMB, CKMBINDEX, TROPONINI in the last 168 hours. BNP: Invalid input(s): POCBNP CBG: Recent Labs  Lab 05/28/18 0709 05/28/18 1114  05/28/18 1611 05/28/18 2040 05/29/18 0653  GLUCAP 122* 91 273* 100* 84   D-Dimer No results for input(s): DDIMER in the last 72 hours. Hgb A1c No results for input(s): HGBA1C in the last 72 hours. Lipid Profile No results for input(s): CHOL, HDL, LDLCALC, TRIG, CHOLHDL, LDLDIRECT in the last 72 hours. Thyroid function studies No results for input(s): TSH, T4TOTAL, T3FREE, THYROIDAB in the last 72 hours.  Invalid input(s): FREET3 Anemia work up No results for input(s): VITAMINB12, FOLATE, FERRITIN, TIBC, IRON, RETICCTPCT in the last 72 hours. Microbiology Recent Results (from the past 240 hour(s))  Urine culture     Status: Abnormal   Collection Time: 05/26/18  9:41 PM  Result Value Ref Range Status   Specimen Description URINE, CLEAN CATCH  Final   Special Requests   Final    NONE Performed at [facility], [place] [address] [place], [place] [address]    Culture MULTIPLE SPECIES PRESENT, SUGGEST RECOLLECTION (A)  Final   Report Status 05/27/2018 FINAL  Final     Discharge Instructions:   Discharge Instructions    Diet - low sodium heart healthy   Complete by:  As directed    Diet Carb Modified   Complete by:  As  directed    Increase activity slowly   Complete by:  As directed      Allergies as of 05/29/2018      Reactions   Atorvastatin Other (See Comments)   Caused muscles in her back to be very sore   Gabapentin Swelling   Legs and feet swell   Nicorette [nicotine] Nausea Only      Medication List    STOP taking these medications   cyclobenzaprine 10 MG tablet Commonly known as:  FLEXERIL   diclofenac sodium 1 % Gel Commonly known as:  VOLTAREN   HYDROcodone-acetaminophen 5-325 MG tablet Commonly known as:  Norco   ondansetron 4 MG tablet Commonly known as:  ZOFRAN   One-A-Day Womens 50+ Advantage Tabs   ranitidine 150 MG tablet Commonly known as:  ZANTAC   sitaGLIPtin 50 MG tablet Commonly known as:  Januvia     TAKE these medications    Accu-Chek Aviva Plus test strip Generic drug:  glucose blood TEST three times a day   Accu-Chek Aviva Plus w/Device Kit Use to check you blood sugar 3 times a day   amLODipine 10 MG tablet Commonly known as:  NORVASC Take 10 mg by mouth daily.   aspirin EC 81 MG tablet Take 1 tablet (81 mg total) by mouth daily.   Linzess 145 MCG Caps capsule Generic drug:  linaclotide Take 145 mcg by mouth daily.   NONFORMULARY OR COMPOUNDED ITEM Apply 1-2 g topically 4 (four) times daily. [facility]  Peripheral Neuropathy Cream- Bupivacaine 1%, Doxepin 3%, Gabapentin 6%, Pentoxifylline 3%, Topiramate 1% Apply 1-2 grams to affected area 3-4 times daily Qty. 120 gm 3 refills   omeprazole 40 MG capsule Commonly known as:  PRILOSEC Take 40 mg by mouth daily.   Pancrelipase (Lip-Prot-Amyl) 24000-76000 units Cpep Take 4 capsules (96,000 Units total) by mouth See admin instructions. Take 4 capsules by mouth 2-3 times daily before meals What changed:    how much to take  additional instructions   ProAir HFA 108 (90 Base) MCG/ACT inhaler Generic drug:  albuterol Inhale 2 puffs into the lungs 4 (four) times daily as needed for wheezing or shortness of breath.   rosuvastatin 20 MG tablet Commonly known as:  CRESTOR TAKE 1 TABLET BY MOUTH EVERY DAY   sucralfate 1 g tablet Commonly known as:  CARAFATE Take 1 tablet (1 g total) by mouth 3 (three) times daily after meals. What changed:  Another medication with the same name was removed. Continue taking this medication, and follow the directions you see here.   traMADol 50 MG tablet Commonly known as:  ULTRAM Take 1 tablet (50 mg total) by mouth every 6 (six) hours as needed for severe pain.   Tyler Aas FlexTouch 100 UNIT/ML Sopn FlexTouch Pen Generic drug:  insulin degludec Inject 10 Units into the skin daily.      Follow-up Information    Nolene Ebbs, MD Follow up in 1 week(s).   Specialty:  Internal Medicine Contact  information: [place] [address] [phone]            Time coordinating discharge: 25 min  Signed:  Geradine Girt DO  [facility] 05/29/2018, 9:23 AM

## 2018-05-29 NOTE — Discharge Instructions (Signed)
Acute Pancreatitis ° °Acute pancreatitis happens when the pancreas gets swollen. The pancreas is a large gland behind the stomach. The pancreas helps control blood sugar. It also makes enzymes that help digest food. This condition happens when the enzymes attack the pancreas and damage it. Most attacks last a couple of days and are dangerous. The lungs, heart, and kidneys may stop working. °What are the causes? °· Alcohol abuse. °· Drug abuse. °· Gallstones. °· Some medicines. °· Some chemicals. °· Infection. °· Damage caused by an accident.. °· Belly (abdominal) surgery. °· In some cases, the cause is not known. °What are the signs or symptoms? °· Pain in the upper belly and back. °· Swelling of the belly °· Feeling sick to your stomach (nausea) and throwing up (vomiting). °How is this treated? °· You will probably have to stay in the hospital. °? Treatment may include: °§ Fluid through an IV. °§ A tube to remove stomach contents and stop you from throwing up. °§ Not eating for 3-4 days. °§ Pain medicine. °§ Antibiotic medicines if you have an infection. °§ Surgery on the pancreas or gallbladder. °Follow these instructions at home: °Eating and drinking ° °· Follow instructions from your doctor about diet. °· Eat small meals often. Avoid eating big meals. °· Eat foods that do not have a lot of fat in them. °· Drink enough fluid to keep your pee (urine) pale yellow. °· Do not drink alcohol if it caused your condition. °General instructions °· Take over-the-counter and prescription medicines only as told by your doctor. °· Do not use cigarettes, e-cigarettes, and chewing tobacco. If you need help quitting, ask your doctor. °· Get plenty of rest. °· If directed, check your blood sugar at home as told by your doctor. °· Keep all follow-up visits as told by your doctor. This is important. °Contact a doctor if: °· You do not get better as quickly as expected. °· You have new symptoms. °· Your symptoms get worse. °· You  have lasting pain or weakness. °· You continue to feel sick to your stomach. °· You get better and then you have another pain attack. °· You have a fever. °Get help right away if: °· You cannot eat or keep fluids down. °· Your pain becomes very bad. °· Your skin or the white part of your eyes turns yellow. °· You throw up. °· You feel dizzy or you pass out. °· Your blood sugar is high (over 300 mg/dL). °Summary °· Acute pancreatitis happens when the pancreas gets swollen. °· This condition is usually caused by alcohol abuse, drug abuse, or gallstones. °· You will probably have to stay in the hospital for treatment. °This information is not intended to replace advice given to you by your health care provider. Make sure you discuss any questions you have with your health care provider. °Document Released: 07/29/2007 Document Revised: 06/15/2016 Document Reviewed: 11/13/2014 °Elsevier Interactive Patient Education © 2019 [employer] ° °

## 2018-05-29 NOTE — Progress Notes (Signed)
[name] to be D/C'd Home per MD order.  Discussed prescriptions and follow up appointments with the patient. Prescriptions given to patient, medication list explained in detail. Pt verbalized understanding.  Allergies as of 05/29/2018      Reactions   Atorvastatin Other (See Comments)   Caused muscles in her back to be very sore   Gabapentin Swelling   Legs and feet swell   Nicorette [nicotine] Nausea Only      Medication List    STOP taking these medications   cyclobenzaprine 10 MG tablet Commonly known as:  FLEXERIL   diclofenac sodium 1 % Gel Commonly known as:  VOLTAREN   HYDROcodone-acetaminophen 5-325 MG tablet Commonly known as:  Norco   ondansetron 4 MG tablet Commonly known as:  ZOFRAN   One-A-Day Womens 50+ Advantage Tabs   ranitidine 150 MG tablet Commonly known as:  ZANTAC   sitaGLIPtin 50 MG tablet Commonly known as:  Januvia     TAKE these medications   Accu-Chek Aviva Plus test strip Generic drug:  glucose blood TEST three times a day   Accu-Chek Aviva Plus w/Device Kit Use to check you blood sugar 3 times a day   amLODipine 10 MG tablet Commonly known as:  NORVASC Take 10 mg by mouth daily.   aspirin EC 81 MG tablet Take 1 tablet (81 mg total) by mouth daily.   Linzess 145 MCG Caps capsule Generic drug:  linaclotide Take 145 mcg by mouth daily.   NONFORMULARY OR COMPOUNDED ITEM Apply 1-2 g topically 4 (four) times daily. [facility]  Peripheral Neuropathy Cream- Bupivacaine 1%, Doxepin 3%, Gabapentin 6%, Pentoxifylline 3%, Topiramate 1% Apply 1-2 grams to affected area 3-4 times daily Qty. 120 gm 3 refills   omeprazole 40 MG capsule Commonly known as:  PRILOSEC Take 40 mg by mouth daily.   Pancrelipase (Lip-Prot-Amyl) 24000-76000 units Cpep Take 4 capsules (96,000 Units total) by mouth See admin instructions. Take 4 capsules by mouth 2-3 times daily before meals What changed:    how much to take  additional  instructions   ProAir HFA 108 (90 Base) MCG/ACT inhaler Generic drug:  albuterol Inhale 2 puffs into the lungs 4 (four) times daily as needed for wheezing or shortness of breath.   rosuvastatin 20 MG tablet Commonly known as:  CRESTOR TAKE 1 TABLET BY MOUTH EVERY DAY   sucralfate 1 g tablet Commonly known as:  CARAFATE Take 1 tablet (1 g total) by mouth 3 (three) times daily after meals. What changed:  Another medication with the same name was removed. Continue taking this medication, and follow the directions you see here.   traMADol 50 MG tablet Commonly known as:  ULTRAM Take 1 tablet (50 mg total) by mouth every 6 (six) hours as needed for severe pain.   Tyler Aas FlexTouch 100 UNIT/ML Sopn FlexTouch Pen Generic drug:  insulin degludec Inject 10 Units into the skin daily.       Vitals:   05/29/18 0456 05/29/18 0904  BP: (!) 166/84 (!) 156/55  Pulse: 65 69  Resp: 18 18  Temp: 98.5 F (36.9 C) 98.5 F (36.9 C)  SpO2: 97% 100%    Skin clean, dry and intact without evidence of skin break down, no evidence of skin tears noted. IV catheter discontinued intact. Site without signs and symptoms of complications. Dressing and pressure applied. Pt denies pain at this time. No complaints noted.  An After Visit Summary was printed and given to the patient. Patient  escorted via [place], and D/C home via private auto.

## 2018-06-01 ENCOUNTER — Ambulatory Visit (HOSPITAL_COMMUNITY)
Admission: RE | Admit: 2018-06-01 | Discharge: 2018-06-01 | Disposition: A | Discharge status: Home / Self Care | Payer: Medicare Other | Source: Ambulatory Visit | Attending: Nephrology | Admitting: Nephrology

## 2018-06-01 ENCOUNTER — Other Ambulatory Visit: Payer: Self-pay

## 2018-06-01 VITALS — BP 125/56 | HR 80 | Temp 97.8°F

## 2018-06-01 DIAGNOSIS — D638 Anemia in other chronic diseases classified elsewhere: Secondary | ICD-10-CM | POA: Insufficient documentation

## 2018-06-01 LAB — POCT HEMOGLOBIN-HEMACUE: Hemoglobin: 10.5 g/dL — ABNORMAL LOW (ref 12.0–15.0)

## 2018-06-01 MED ORDER — EPOETIN ALFA 10000 UNIT/ML IJ SOLN: 5000.0000 [IU] | INTRAMUSCULAR | Status: DC

## 2018-06-01 MED ORDER — EPOETIN ALFA 10000 UNIT/ML IJ SOLN
INTRAMUSCULAR | Status: AC
  Administered 2018-06-01: 10000 [IU] via SUBCUTANEOUS
  Filled 2018-06-01: qty 1

## 2018-06-01 MED ORDER — EPOETIN ALFA 20000 UNIT/ML IJ SOLN: 10000.0000 [IU] | INTRAMUSCULAR | Status: DC

## 2018-06-02 ENCOUNTER — Other Ambulatory Visit: Payer: Self-pay

## 2018-06-02 ENCOUNTER — Ambulatory Visit (INDEPENDENT_AMBULATORY_CARE_PROVIDER_SITE_OTHER): Payer: Medicare Other | Admitting: Podiatry

## 2018-06-02 VITALS — Temp 97.5°F

## 2018-06-02 DIAGNOSIS — L97411 Non-pressure chronic ulcer of right heel and midfoot limited to breakdown of skin: Secondary | ICD-10-CM

## 2018-06-02 DIAGNOSIS — Q828 Other specified congenital malformations of skin: Secondary | ICD-10-CM

## 2018-06-02 DIAGNOSIS — E08621 Diabetes mellitus due to underlying condition with foot ulcer: Secondary | ICD-10-CM | POA: Diagnosis not present

## 2018-06-02 DIAGNOSIS — E1142 Type 2 diabetes mellitus with diabetic polyneuropathy: Secondary | ICD-10-CM | POA: Diagnosis not present

## 2018-06-02 NOTE — Progress Notes (Signed)
Subjective:  Patient ID: [name], female    DOB: 1950-06-04,  MRN: [id number]  Chief Complaint  Patient presents with  . Wound Check    " my foot hurts"     68 y.o. female presents with the above complaint. Missed her last appt from diarrhea, states the calluses are thick and painful.  Review of Systems: Negative except as noted in the HPI. Denies N/V/F/Ch.  Past Medical History:  Diagnosis Date  . ANEMIA, CHRONIC DISEASE NEC 03/15/2006  . Carotid artery occlusion   . Cirrhosis ([place])   . Colon polyps   . Constipation due to opioid therapy 06/05/2014  . DEGENERATIVE JOINT DISEASE 05/06/2007   On going for >5 yrs No imaging to confirm Has tried ultram, mobic, neurontin which did not work Ambulance person tried once worked well    . Diabetes mellitus    Type II  . DIABETIC  RETINOPATHY 03/15/2006  . 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 Diastolic dysfunction 60/73/7106   Grade 1 by Echocardiogram 12/13/14  . Elevated liver enzymes   . Heart murmur   . Hemorrhoids   . History of kidney stones   . Hyperlipidemia 04/01/2011  . Hypertension   . Mild AI (aortic insufficiency)   . Moderate aortic regurgitation 09/25/2008   12/13/14 Echocardiogram    . Myalgia   . PAD (peripheral artery disease) ([place])   . PANCREATITIS, CHRONIC 03/29/2006  . Peripheral arterial disease ([place])   . Pneumonia    age [age]  . RENAL INSUFFICIENCY, CHRONIC 03/15/2006  . Tonsillar mass    Left    Current Outpatient Medications:  .  ACCU-CHEK AVIVA PLUS test strip, TEST three times a day, Disp: 100 each, Rfl: 11 .  amLODipine (NORVASC) 10 MG tablet, Take 10 mg by mouth daily., Disp: , Rfl:  .  aspirin EC 81 MG tablet, Take 1 tablet (81 mg total) by mouth daily., Disp: , Rfl:  .  Blood Glucose Monitoring Suppl (ACCU-CHEK AVIVA PLUS) w/Device KIT, Use to check you blood sugar 3 times a day, Disp: 1 kit, Rfl: 1 .  LINZESS 145 MCG CAPS capsule, Take 145 mcg by mouth daily. , Disp: , Rfl:  .  NONFORMULARY OR  COMPOUNDED ITEM, Apply 1-2 g topically 4 (four) times daily. [facility]  Peripheral Neuropathy Cream- Bupivacaine 1%, Doxepin 3%, Gabapentin 6%, Pentoxifylline 3%, Topiramate 1% Apply 1-2 grams to affected area 3-4 times daily Qty. 120 gm 3 refills (Patient not taking: Reported on 05/26/2018), Disp: 120 each, Rfl: 2 .  omeprazole (PRILOSEC) 40 MG capsule, Take 40 mg by mouth daily., Disp: , Rfl:  .  Pancrelipase, Lip-Prot-Amyl, 24000-76000 units CPEP, Take 4 capsules (96,000 Units total) by mouth See admin instructions. Take 4 capsules by mouth 2-3 times daily before meals (Patient taking differently: Take 2-4 capsules by mouth See admin instructions. Take 4 capsules by mouth two to three times a day with meals and 2 capsules with any snacks), Disp: 1170 capsule, Rfl: 3 .  PROAIR HFA 108 (90 Base) MCG/ACT inhaler, Inhale 2 puffs into the lungs 4 (four) times daily as needed for wheezing or shortness of breath., Disp: , Rfl: 3 .  rosuvastatin (CRESTOR) 20 MG tablet, TAKE 1 TABLET BY MOUTH [name] DAY (Patient taking differently: Take 20 mg by mouth daily. ), Disp: 90 tablet, Rfl: 3 .  sucralfate (CARAFATE) 1 g tablet, Take 1 tablet (1 g total) by mouth 3 (three) times daily after meals., Disp: 60 tablet, Rfl: 0 .  traMADol (ULTRAM) 50 MG tablet, Take 1 tablet (50 mg total) by mouth [name] 6 (six) hours as needed for severe pain., Disp: 8 tablet, Rfl: 0 .  TRESIBA FLEXTOUCH 100 UNIT/ML SOPN FlexTouch Pen, Inject 10 Units into the skin daily., Disp: , Rfl:   Social History   Tobacco Use  Smoking Status Former Smoker  . Packs/day: 0.30  . Years: 44.00  . Pack years: 13.20  . Types: Cigarettes  . Last attempt to quit: 03/2017  . Years since quitting: 1.1  Smokeless Tobacco Never Used  Tobacco Comment   started smoking in Nov 2019 few cigarettes a day    Allergies  Allergen Reactions  . Atorvastatin Other (See Comments)    Caused muscles in her back to be very sore  . Gabapentin Swelling     Legs and feet swell  . Nicorette [Nicotine] Nausea Only   Objective:   Vitals:   06/02/18 0833  Temp: (!) 97.5 F (36.4 C)   There is no height or weight on file to calculate BMI. Constitutional Well developed. Well nourished.  Vascular Dorsalis pedis pulses palpable bilaterally. Posterior tibial pulses non-palpable bilaterally. Capillary refill normal to all digits.  No cyanosis or clubbing noted. Pedal hair growth normal.  Neurologic Normal speech. Oriented to person, place, and time. Epicritic sensation to light touch grossly present bilaterally.  Dermatologic Nails well groomed and normal in appearance. No skin lesions. Would R 5th MPJ  0.5 x 0.5 post-debridement. Punctate keratoses sub-met 1 5 heel left, first MPJ right  Orthopedic: Normal joint ROM without pain or crepitus bilaterally. No visible deformities. Pain on palpation right fifth MPJ   Radiographs: None today Assessment:   1. Diabetic ulcer of right midfoot associated with diabetes mellitus due to underlying condition, limited to breakdown of skin ([place])   2. Porokeratosis   3. DM type 2 with diabetic peripheral neuropathy ([place])    Plan:  Patient was evaluated and treated and all questions answered.  Ulcer R 5th MPJ -Still pending surgery. -Ulcer debrided as below -Continue offloading inserts.  Procedure: Excisional Debridement of Wound Rationale: Removal of non-viable soft tissue from the wound to promote healing.  Anesthesia: none Pre-Debridement Wound Measurements: overlying hyperkeratosis Post-Debridement Wound Measurements: 0.5x0.5x0.2 Type of Debridement: Sharp Excisional Tissue Removed: Non-viable soft tissue Depth of Debridement: subcutaneous tissue. Technique: Sharp excisional debridement to bleeding, viable wound base.  Dressing: Dry, sterile, compression dressing. Disposition: Patient tolerated procedure well. Patient to return in 1 week for follow-up.  Porokeratoses Bilat feet in  setting of DM with DPN -Lesions debrided x3  Procedure: Paring of Lesion Rationale: painful hyperkeratotic lesion Type of Debridement: manual, sharp debridement. Instrumentation: 312 blade Number of Lesions: 3     Return in about 9 weeks (around 08/04/2018) for Diabetic Foot Care, Plantar fasciitis.

## 2018-06-06 ENCOUNTER — Other Ambulatory Visit: Payer: Self-pay

## 2018-06-06 ENCOUNTER — Ambulatory Visit (INDEPENDENT_AMBULATORY_CARE_PROVIDER_SITE_OTHER): Payer: Medicare Other | Admitting: Cardiology

## 2018-06-06 ENCOUNTER — Encounter: Payer: Self-pay | Admitting: Cardiology

## 2018-06-06 DIAGNOSIS — I10 Essential (primary) hypertension: Secondary | ICD-10-CM | POA: Diagnosis not present

## 2018-06-06 DIAGNOSIS — I739 Peripheral vascular disease, unspecified: Secondary | ICD-10-CM

## 2018-06-06 DIAGNOSIS — L97419 Non-pressure chronic ulcer of right heel and midfoot with unspecified severity: Secondary | ICD-10-CM | POA: Diagnosis not present

## 2018-06-06 DIAGNOSIS — E1122 Type 2 diabetes mellitus with diabetic chronic kidney disease: Secondary | ICD-10-CM

## 2018-06-06 DIAGNOSIS — Z87891 Personal history of nicotine dependence: Secondary | ICD-10-CM

## 2018-06-06 DIAGNOSIS — E1165 Type 2 diabetes mellitus with hyperglycemia: Secondary | ICD-10-CM

## 2018-06-06 DIAGNOSIS — K861 Other chronic pancreatitis: Secondary | ICD-10-CM

## 2018-06-06 DIAGNOSIS — IMO0002 Reserved for concepts with insufficient information to code with codable children: Secondary | ICD-10-CM

## 2018-06-06 NOTE — Progress Notes (Signed)
Subjective:   @Patient  ID@: [name], female    DOB: 1950/11/21, 68 y.o.   MRN: [id number]  Nolene Ebbs, MD:  Chief Complaint  Patient presents with   PAD   Hypertension   Follow-up   This visit type was conducted due to national recommendations for restrictions regarding the COVID-19 Pandemic (e.g. social distancing).  This format is felt to be most appropriate for this patient at this time.  All issues noted in this document were discussed and addressed.  No physical exam was performed (except for noted visual exam findings with Telehealth visits).  The patient has consented to conduct a Telehealth visit and understands insurance will be billed.   I discussed the limitations of evaluation and management by telemedicine and the availability of in person appointments. The patient expressed understanding and agreed to proceed.  Virtual Visit via Video Note is as below  I connected with [name], on 06/06/18 at 1415 by telephone and verified that I am speaking with the correct person using two identifiers. Unable to do video visit as she did not have equipment for this and preferred telephone visit.    I have discussed with her regarding the safety during COVID Pandemic and steps and precautions including social distancing with the patient.    HPI:   Patient with type 2 diabetes, anemia likely from CKD with regular Procrit injections , chronic pancreatitis, CKD stage III, COPD, carotid stenosis diagnosed in October 2018, hypertension, PAD by LE duplex in Feb 2020, and hyperlipidemia.  She was initially evaluated by [place] for preoperative risk stratification for right foot diabetic foot ulceration debridement with Dr. March Rummage, in which was performed on 03/24/2018. She was also scheduled for laryngoscopy for tonsillar nodule with Dr. Reginia Naas; however, patient did not show for her procedure. Lexiscan nuclear stress test in July 2019 was considered low risk study. Echo  performed also at that time showed normal LVEF with moderate AR. ABIin July 2019 showed severely abnormal waveforms and moderateley decreased ABI on the left at 0.5 and moderatley abnormal waveform on the right and mild decrease in ABI. Initially, we had recommended medical therapy; however, due to persistent right foot ulceration and mild symptoms, she underwent LE arterial duplex on 04/21/2018 that showed bilateral diffuse plaque with severely abnormal waveform of left LE and moderately abnormal waveform of right LE. ABI was 0.93 on right and 0.98 on left, but felt to be falsely elevated in view of diabetes. She is here to discuss results.  She has history of chronic pancreatitis and has had several admissions over the last few months with most recent admission in April 2020 for acute on chronic pancreatitis. She is on Creon and PPI. Scheduled for repeat MRI in 6 weeks and labs with PCP tomorrow (04/14). Has not had any abdominal pain or back pain since discharge. LFT's were elevated during hospitalization; however, trended down at discharge. She continues to be on statin therapy.   She continues to have non-healing ulceration on her right foot requiring frequent debridement. States that Dr. March Rummage is wanting to do surgery to remove "bone in her foot" to help with her callous, but would like for diabetes to be better controlled. Previously she has mentioned fatigue in her thigh with walking; however, she denies this today. States that she has not been walking very much due to here pancreatitis. She does have pain in bilateral feet at rest and also some cramping at night.  Reports that her blood  pressure fluctuated while in the hospital. She does not have a way to monitor at home.   Past Medical History:  Diagnosis Date   ANEMIA, CHRONIC DISEASE NEC 03/15/2006   Carotid artery occlusion    Cirrhosis ([place])    Colon polyps    Constipation due to opioid therapy 06/05/2014   DEGENERATIVE JOINT DISEASE  05/06/2007   On going for >5 yrs No imaging to confirm Has tried ultram, mobic, neurontin which did not work Ambulance person tried once worked well     Diabetes mellitus    Type II   DIABETIC  RETINOPATHY 03/15/2006   DIABETIC PERIPHERAL NEUROPATHY 2/53/6644   Diastolic dysfunction 03/47/4259   Grade 1 by Echocardiogram 12/13/14   Elevated liver enzymes    Heart murmur    Hemorrhoids    History of kidney stones    Hyperlipidemia 04/01/2011   Hypertension    Mild AI (aortic insufficiency)    Moderate aortic regurgitation 09/25/2008   12/13/14 Echocardiogram     Myalgia    PAD (peripheral artery disease) ([place])    PANCREATITIS, CHRONIC 03/29/2006   Peripheral arterial disease ([place])    Pneumonia    age 52-20   RENAL INSUFFICIENCY, CHRONIC 03/15/2006   Tonsillar mass    Left    Past Surgical History:  Procedure Laterality Date   BREAST SURGERY     COLONOSCOPY     DILATION AND CURETTAGE OF UTERUS N/A 06/16/2016   Procedure: DILATATION AND CURETTAGE;  Surgeon: Mora Bellman, MD;  Location: [facility];  Service: Gynecology;  Laterality: N/A;   HYSTEROSCOPY N/A 06/16/2016   Procedure: HYSTEROSCOPY;  Surgeon: Mora Bellman, MD;  Location: [facility];  Service: Gynecology;  Laterality: N/A;   MOUTH SURGERY N/A    POLYPECTOMY N/A 06/16/2016   Procedure: POLYPECTOMY;  Surgeon: Mora Bellman, MD;  Location: [facility];  Service: Gynecology;  Laterality: N/A;    Family History  Problem Relation Age of Onset   Dementia Mother    Colon cancer Neg Hx     Social History   Socioeconomic History   Marital status: Single    Spouse name: Not on file   Number of children: 3   Years of education: Not on file   Highest education level: Not on file  Occupational History   Occupation: retired- disabled  Scientist, product/process development strain: Not on file   Food insecurity:    Worry: Not on file    Inability: Not on file   Transportation needs:    Medical: Not on file     Non-medical: Not on file  Tobacco Use   Smoking status: Former Smoker    Packs/day: 0.30    Years: 44.00    Pack years: 13.20    Types: Cigarettes    Last attempt to quit: 03/2017    Years since quitting: 1.1   Smokeless tobacco: Never Used   Tobacco comment: started smoking in Nov 2019 few cigarettes a day  Substance and Sexual Activity   Alcohol use: Not Currently    Alcohol/week: 0.0 standard drinks   Drug use: No   Sexual activity: Not on file  Lifestyle   Physical activity:    Days per week: Not on file    Minutes per session: Not on file   Stress: Not on file  Relationships   Social connections:    Talks on phone: Not on file    Gets together: Not on file    Attends religious service: Not on  file    Active member of club or organization: Not on file    Attends meetings of clubs or organizations: Not on file    Relationship status: Not on file   Intimate partner violence:    Fear of current or ex partner: Not on file    Emotionally abused: Not on file    Physically abused: Not on file    Forced sexual activity: Not on file  Other Topics Concern   Not on file  Social History Narrative   Not on file   Current Meds  Medication Sig   amLODipine (NORVASC) 10 MG tablet Take 10 mg by mouth daily.   aspirin EC 81 MG tablet Take 1 tablet (81 mg total) by mouth daily.   LINZESS 145 MCG CAPS capsule Take 145 mcg by mouth daily.    omeprazole (PRILOSEC) 40 MG capsule Take 40 mg by mouth daily.   Pancrelipase, Lip-Prot-Amyl, 24000-76000 units CPEP Take 4 capsules (96,000 Units total) by mouth See admin instructions. Take 4 capsules by mouth 2-3 times daily before meals (Patient taking differently: Take 2-4 capsules by mouth See admin instructions. Take 4 capsules by mouth two to three times a day with meals and 2 capsules with any snacks)   PROAIR HFA 108 (90 Base) MCG/ACT inhaler Inhale 2 puffs into the lungs 4 (four) times daily as needed for wheezing or  shortness of breath.   rosuvastatin (CRESTOR) 20 MG tablet TAKE 1 TABLET BY MOUTH EVERY DAY (Patient taking differently: Take 20 mg by mouth daily. )   sucralfate (CARAFATE) 1 g tablet Take 1 tablet (1 g total) by mouth 3 (three) times daily after meals.   traMADol (ULTRAM) 50 MG tablet Take 1 tablet (50 mg total) by mouth every 6 (six) hours as needed for severe pain.   TRESIBA FLEXTOUCH 100 UNIT/ML SOPN FlexTouch Pen Inject 10 Units into the skin daily.      Review of Systems  Constitution: Negative for decreased appetite, malaise/fatigue, weight gain and weight loss.  Eyes: Negative for visual disturbance.  Cardiovascular: Negative for chest pain, claudication, dyspnea on exertion, leg swelling, orthopnea, palpitations and syncope.  Respiratory: Negative for hemoptysis and wheezing.   Endocrine: Negative for cold intolerance and heat intolerance.  Hematologic/Lymphatic: Does not bruise/bleed easily.  Skin: Positive for poor wound healing. Negative for nail changes.  Musculoskeletal: Positive for joint pain (right foot). Negative for back pain, muscle weakness and myalgias.  Gastrointestinal: Negative for abdominal pain, change in bowel habit, nausea and vomiting.  Neurological: Negative for difficulty with concentration, dizziness, focal weakness and headaches.  Psychiatric/Behavioral: Negative for altered mental status and suicidal ideas.  All other systems reviewed and are negative.      Objective:     There were no vitals filed for this visit. There is no height or weight on file to calculate BMI.   Echocardiogram 09/21/2017: Left ventricle cavity is normal in size. Severe concentric hypertrophy of the left ventricle, both septal and posterior wall measuring 1.7 cm in AP dimension. Normal global wall motion. Grade II diastolic dysfunction. Elevated LAP. Calculated EF 59%. Left atrial cavity is mildly dilated. Mild aortic valve leaflet thickening. Moderate (Grade III)  aortic regurgitation. Moderate (Grade II) mitral regurgitation. Moderate tricuspid regurgitation. Estimated pulmonary artery systolic pressure 31 mmHg.  Lexiscan myoview stress test 09/06/2017: 1. Lexiscan stress test was performed. Exercise capacity was not assessed. No stress symptoms reported. Peak blood pressure was 148/88 mmHg. The resting and stress electrocardiogram demonstrated normal sinus  rhythm, normal resting conduction, no resting arrhythmias and normal rest repolarization. Possible old anteroseptal infarct. Stress EKG is non diagnostic for ischemia as it is a pharmacologic stress. 2. The overall quality of the study is excellent. There is no evidence of abnormal lung activity. Stress and rest SPECT images demonstrate homogeneous tracer distribution throughout the myocardium. Gated SPECT imaging reveals normal myocardial thickening and wall motion. The left ventricular ejection fraction was normal (74%). 3. Low risk study.  ABI 09/22/2017: Moderately abnormal waveforms of the right ankle. Severely abnormal waveforms of the left ankle. Mildly decreased right resting ABI at o.96 and moderately decreased left resting ABI at 0.59.  Carotid artery duplex 09/21/2017: Minimal stenosis in the right internal carotid artery (1-15%) with heterogeneous plaque Antegrade right vertebral artery flow. Antegrade left vertebral artery flow.  Laboratory exam:  CMP     Component Value Date/Time   NA 138 05/28/2018 0439   NA 140 04/09/2016 0932   K 3.9 05/28/2018 0439   CL 105 05/28/2018 0439   CO2 24 05/28/2018 0439   GLUCOSE 62 (L) 05/28/2018 0439   BUN 15 05/28/2018 0439   BUN 36 (H) 04/09/2016 0932   CREATININE 0.97 05/28/2018 0439   CREATININE 1.35 (H) 05/03/2014 1522   CALCIUM 8.6 (L) 05/28/2018 0439   PROT 6.7 05/28/2018 0439   ALBUMIN 2.2 (L) 05/28/2018 0439   AST 35 05/28/2018 0439   ALT 52 (H) 05/28/2018 0439   ALKPHOS 134 (H) 05/28/2018 0439   BILITOT 0.4 05/28/2018 0439    GFRNONAA >60 05/28/2018 0439   GFRNONAA 42 (L) 05/03/2014 1522   GFRAA >60 05/28/2018 0439   GFRAA 49 (L) 05/03/2014 1522   CBC    Component Value Date/Time   WBC 2.4 (L) 05/28/2018 0439   RBC 3.51 (L) 05/28/2018 0439   HGB 10.5 (L) 06/01/2018 1304   HCT 29.9 (L) 05/28/2018 0439   PLT 187 05/28/2018 0439   MCV 85.2 05/28/2018 0439   MCH 27.4 05/28/2018 0439   MCHC 32.1 05/28/2018 0439   RDW 15.7 (H) 05/28/2018 0439   LYMPHSABS 1.0 05/26/2018 2125   MONOABS 0.5 05/26/2018 2125   EOSABS 0.0 05/26/2018 2125   BASOSABS 0.0 05/26/2018 2125     Labs 07/30/2017: H/H 9.8/29.2.  MCV 80.  Platelets 145 Glucose 304.  BUN/creatinine of 4/1.36.  EGFR 41/47.  Sodium 134, potassium 4.5 Cholesterol 88, HDL 52, triglycerides 60, LDL 22 TSH 0.98 normal  *Physical exam not performed today as this was a telephone visit*     Assessment & Recommendations:  1. Peripheral arterial disease ([place]) I have discussed results of recent LE arterial duplex, study does not show significant stenosis bilaterally, she does have abnormal waveform and I feel she is high risk given her multiple risk factors. Today, she denies any symptoms of claudication; however, also likely due to inactivity recently. I have discussed options of medical management vs. PV angiogram for further evaluation. I feel that her best option in view of non-healing ulceration, would be to undergo PV angiogram for further evaluation. Patient is agreeable to this and wishes to proceed.  Will schedule for peripheral arteriogram and possible angioplasty given symptoms. Patient understands the risks, benefits, alternatives including medical therapy, CT angiography. Patient understands <1-2% risk of death, embolic complications, bleeding, infection, renal failure, urgent surgical revascularization, but not limited to these and wants to proceed. Continue with ASA and statin.   2. Midfoot ulceration, right, with unspecified severity [facility]) Being  followed and managed by Dr. March Rummage.  Likely multifactorial from PAD and uncontrolled diabetes. As stated above, will perform PV angiogram to see if she has any significant disease that is contributing.  3. Uncontrolled type 2 diabetes mellitus with chronic kidney disease, without long-term current use of insulin ([place]) Managed by PCP and continues to be uncontrolled. Likely contributing to foot ulceration as well. CKD has remained stable.  4. Essential hypertension Blood pressure did fluctuate during her hospitalization. She is to see her PCP tomorrow (04/14) and will have her BP checked at that time. She does not have a way to check her BP at home. No changes were made to her medications today.  5. Chronic pancreatitis, unspecified pancreatitis type ([place]) Recent episode of acute on chronic pancreatitis related to previous alcohol use. She is without symptoms of abdominal pain or back pain. Will have labs performed with PCP to follow up on LFT's that were elevated, but trending downward at discharge. She will also need follow up with GI.  6. Former tobacco use Previous history of heavy tobacco use. She has remained abstinent for almost one year. I have congratulated her on this and provided positive reinforcement.   Plan:  Patient will be scheduled for PV angiogram and we will see her back after the procedure for further recommendations and reevaluation.   Jeri Lager, FNP-C [facility], [place] Office: [phone] Fax: [phone]

## 2018-06-14 ENCOUNTER — Other Ambulatory Visit: Payer: Self-pay

## 2018-06-15 ENCOUNTER — Ambulatory Visit (HOSPITAL_COMMUNITY)
Admission: RE | Admit: 2018-06-15 | Discharge: 2018-06-15 | Disposition: A | Discharge status: Home / Self Care | Payer: Medicare Other | Source: Ambulatory Visit | Attending: Nephrology | Admitting: Nephrology

## 2018-06-15 VITALS — BP 106/48 | HR 85 | Temp 97.8°F | Resp 20

## 2018-06-15 DIAGNOSIS — D638 Anemia in other chronic diseases classified elsewhere: Secondary | ICD-10-CM | POA: Insufficient documentation

## 2018-06-15 LAB — IRON AND TIBC
Iron: 19 ug/dL — ABNORMAL LOW (ref 28–170)
Saturation Ratios: 8 % — ABNORMAL LOW (ref 10.4–31.8)
TIBC: 227 ug/dL — ABNORMAL LOW (ref 250–450)
UIBC: 208 ug/dL

## 2018-06-15 LAB — POCT HEMOGLOBIN-HEMACUE: Hemoglobin: 9.5 g/dL — ABNORMAL LOW (ref 12.0–15.0)

## 2018-06-15 LAB — FERRITIN: Ferritin: 96 ng/mL (ref 11–307)

## 2018-06-15 MED ORDER — EPOETIN ALFA 10000 UNIT/ML IJ SOLN
10000.0000 [IU] | INTRAMUSCULAR | Status: DC
  Administered 2018-06-15: 10000 [IU] via SUBCUTANEOUS

## 2018-06-15 MED ORDER — EPOETIN ALFA 10000 UNIT/ML IJ SOLN
INTRAMUSCULAR | Status: AC
  Filled 2018-06-15: qty 1

## 2018-06-21 ENCOUNTER — Encounter (HOSPITAL_COMMUNITY): Admission: RE | Payer: Self-pay | Source: Home / Self Care

## 2018-06-21 ENCOUNTER — Ambulatory Visit (HOSPITAL_COMMUNITY): Admission: RE | Admit: 2018-06-21 | Payer: Medicare Other | Source: Home / Self Care | Admitting: Cardiology

## 2018-06-21 SURGERY — LOWER EXTREMITY ANGIOGRAPHY
Anesthesia: LOCAL

## 2018-06-23 ENCOUNTER — Other Ambulatory Visit: Payer: Self-pay

## 2018-06-23 ENCOUNTER — Encounter: Payer: Self-pay | Admitting: Internal Medicine

## 2018-06-23 ENCOUNTER — Ambulatory Visit (INDEPENDENT_AMBULATORY_CARE_PROVIDER_SITE_OTHER): Payer: Medicare Other | Admitting: Internal Medicine

## 2018-06-23 VITALS — Ht 65.5 in | Wt 134.0 lb

## 2018-06-23 DIAGNOSIS — K861 Other chronic pancreatitis: Secondary | ICD-10-CM

## 2018-06-23 DIAGNOSIS — K7030 Alcoholic cirrhosis of liver without ascites: Secondary | ICD-10-CM

## 2018-06-23 DIAGNOSIS — R1013 Epigastric pain: Secondary | ICD-10-CM | POA: Diagnosis not present

## 2018-06-23 DIAGNOSIS — K219 Gastro-esophageal reflux disease without esophagitis: Secondary | ICD-10-CM

## 2018-06-23 DIAGNOSIS — R935 Abnormal findings on diagnostic imaging of other abdominal regions, including retroperitoneum: Secondary | ICD-10-CM

## 2018-06-23 NOTE — Progress Notes (Signed)
HISTORY OF PRESENT ILLNESS:  [name] is a 68 y.o. female with multiple significant medical problems who schedules this telemedicine visit in the midst of the coronavirus pandemic regarding 2 recent hospitalizations with acute on chronic pancreatitis.  As well problems with constipation.  She has multiple questions.  Multiple significant medical problems as listed below.  GI problems include chronic GERD, compensated hepatic cirrhosis and elevated liver test secondary to alcohol, chronic calcific pancreatitis with a history of pancreatic insufficiency secondary to chronic alcohol, adenomatous colon polyps, and intermittent issues with constipation.  The patient was last seen in this office January 18, 2018 regarding complaints of throat clearing.  This was not felt likely to be GERD.  She was on PPI and H2 receptor antagonist therapy at the time.  Those issues have seemingly improved.  However, the patient was hospitalized February 2020 with acute epigastric pain.  CT imaging revealed chronic calcific pancreatitis with superimposed pancreatic inflammation, changes of constipation, and right inguinal hernia.  Symptoms improved in several days.  She tells me that she has been abstinent from alcohol for over a year.  Is not clear if she considers beer alcohol or not.  In any event, she was hospitalized April 2 through May 29, 2018 with recurrent epigastric pain with radiation into the back.  Some nausea but no vomiting.  Hemoglobin at that time was 10.9.  Lipase normal.  BUN 30 and creatinine 1.2.  Liver tests mildly elevated with AST 49, ALT 78, alkaline phosphatase 166, total bilirubin 0.4.  Since her hospital discharge she has been doing well.  She has been suffering with chronic constipation recently.  She is now taking Metamucil daily which she says helps tremendously.  Her weight has been stable no other complaints currently.  Reflux symptoms under good control  REVIEW OF SYSTEMS:  All non-GI ROS  negative unless otherwise stated in the HPI except for occasional muscle aches  Past Medical History:  Diagnosis Date  . ANEMIA, CHRONIC DISEASE NEC 03/15/2006  . Carotid artery occlusion   . Cirrhosis ([place])   . Colon polyps   . Constipation due to opioid therapy 06/05/2014  . DEGENERATIVE JOINT DISEASE 05/06/2007   On going for >5 yrs No imaging to confirm Has tried ultram, mobic, neurontin which did not work Ambulance person tried once worked well    . Diabetes mellitus    Type II  . DIABETIC  RETINOPATHY 03/15/2006  . 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 Diastolic dysfunction 38/11/1749   Grade 1 by Echocardiogram 12/13/14  . Elevated liver enzymes   . Heart murmur   . Hemorrhoids   . History of kidney stones   . Hyperlipidemia 04/01/2011  . Hypertension   . Mild AI (aortic insufficiency)   . Moderate aortic regurgitation 09/25/2008   12/13/14 Echocardiogram    . Myalgia   . PAD (peripheral artery disease) ([place])   . PANCREATITIS, CHRONIC 03/29/2006  . Peripheral arterial disease ([place])   . Pneumonia    age [age]  . RENAL INSUFFICIENCY, CHRONIC 03/15/2006  . Tonsillar mass    Left    Past Surgical History:  Procedure Laterality Date  . BREAST SURGERY    . COLONOSCOPY    . DILATION AND CURETTAGE OF UTERUS N/A 06/16/2016   Procedure: DILATATION AND CURETTAGE;  Surgeon: Mora Bellman, MD;  Location: [facility];  Service: Gynecology;  Laterality: N/A;  . HYSTEROSCOPY N/A 06/16/2016   Procedure: HYSTEROSCOPY;  Surgeon: Mora Bellman, MD;  Location: [facility];  Service:  Gynecology;  Laterality: N/A;  . MOUTH SURGERY N/A   . POLYPECTOMY N/A 06/16/2016   Procedure: POLYPECTOMY;  Surgeon: Mora Bellman, MD;  Location: [facility];  Service: Gynecology;  Laterality: N/A;    Social History Rmoni C Ton  reports that she quit smoking about 14 months ago. Her smoking use included cigarettes. She has a 13.20 pack-year smoking history. She has never used smokeless tobacco. She reports previous alcohol  use. She reports that she does not use drugs.  family history includes Dementia in her mother.  Allergies  Allergen Reactions  . Atorvastatin Other (See Comments)    Caused muscles in her back to be very sore  . Gabapentin Swelling    Legs and feet swell  . Nicorette [Nicotine] Nausea Only       PHYSICAL EXAMINATION:  No physical exam with telemedicine visit   ASSESSMENT:  1.  Acute on chronic pancreatitis with associated abdominal pain.  Has not resolved 2.  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  History of pancreatic insufficiency 4.  Hepatic cirrhosis and elevated liver test secondary to alcohol 5.  GERD.  Symptoms controlled on current regimen 6.  Chronic constipation improved with fiber and water 7.  History of adenomatous colon polyps.  Surveillance up-to-date   PLAN:  1.  Extensive discussion today on pancreatitis, chronic pancreatitis, alcohol related pancreatitis, and pancreatic insufficiency.  Multiple questions answered 2.  No alcohol forever recommended. 3.  Low-fat diet 4.  Continue current regimen for GERD 5.  Continue current regimen for constipation 6.  Continue ongoing follow-up with her other physicians for her multiple medical problems 7.  Routine GI office follow-up in about 3 months.  Contact the office in the interim for questions or problems This telemedicine visit was initiated by the patient and consented for by the patient.  She was in her home and I was in my office during the encounter.  She understands her may be an associated professional charge for this service.  The encounter and its components were approximate 25 minutes

## 2018-06-28 ENCOUNTER — Other Ambulatory Visit: Payer: Self-pay

## 2018-06-29 ENCOUNTER — Ambulatory Visit (HOSPITAL_COMMUNITY)
Admission: RE | Admit: 2018-06-29 | Discharge: 2018-06-29 | Disposition: A | Discharge status: Home / Self Care | Payer: Medicare Other | Source: Ambulatory Visit | Attending: Nephrology | Admitting: Nephrology

## 2018-06-29 VITALS — BP 122/55 | HR 77 | Temp 97.3°F | Resp 20

## 2018-06-29 DIAGNOSIS — D638 Anemia in other chronic diseases classified elsewhere: Secondary | ICD-10-CM | POA: Insufficient documentation

## 2018-06-29 LAB — POCT HEMOGLOBIN-HEMACUE: Hemoglobin: 9.8 g/dL — ABNORMAL LOW (ref 12.0–15.0)

## 2018-06-29 MED ORDER — EPOETIN ALFA 10000 UNIT/ML IJ SOLN: 10000.0000 [IU] | INTRAMUSCULAR | Status: DC

## 2018-06-29 MED ORDER — EPOETIN ALFA 10000 UNIT/ML IJ SOLN
INTRAMUSCULAR | Status: AC
  Administered 2018-06-29: 10000 [IU] via SUBCUTANEOUS
  Filled 2018-06-29: qty 1

## 2018-07-07 ENCOUNTER — Other Ambulatory Visit: Payer: Self-pay

## 2018-07-07 ENCOUNTER — Ambulatory Visit (INDEPENDENT_AMBULATORY_CARE_PROVIDER_SITE_OTHER): Payer: Medicare Other | Admitting: Podiatry

## 2018-07-07 ENCOUNTER — Ambulatory Visit (INDEPENDENT_AMBULATORY_CARE_PROVIDER_SITE_OTHER): Payer: Medicare Other

## 2018-07-07 DIAGNOSIS — M7750 Other enthesopathy of unspecified foot: Secondary | ICD-10-CM

## 2018-07-07 DIAGNOSIS — R600 Localized edema: Secondary | ICD-10-CM

## 2018-07-07 DIAGNOSIS — E08621 Diabetes mellitus due to underlying condition with foot ulcer: Secondary | ICD-10-CM | POA: Diagnosis not present

## 2018-07-07 DIAGNOSIS — L97411 Non-pressure chronic ulcer of right heel and midfoot limited to breakdown of skin: Secondary | ICD-10-CM | POA: Diagnosis not present

## 2018-07-07 DIAGNOSIS — M7752 Other enthesopathy of left foot: Secondary | ICD-10-CM | POA: Diagnosis not present

## 2018-07-13 ENCOUNTER — Telehealth: Payer: Self-pay

## 2018-07-13 ENCOUNTER — Other Ambulatory Visit: Payer: Self-pay

## 2018-07-13 ENCOUNTER — Ambulatory Visit (HOSPITAL_COMMUNITY)
Admission: RE | Admit: 2018-07-13 | Discharge: 2018-07-13 | Disposition: A | Discharge status: Home / Self Care | Payer: Medicare Other | Source: Ambulatory Visit | Attending: Nephrology | Admitting: Nephrology

## 2018-07-13 VITALS — BP 120/54 | HR 76 | Temp 97.9°F | Resp 20

## 2018-07-13 DIAGNOSIS — D638 Anemia in other chronic diseases classified elsewhere: Secondary | ICD-10-CM | POA: Insufficient documentation

## 2018-07-13 LAB — POCT HEMOGLOBIN-HEMACUE: Hemoglobin: 9.8 g/dL — ABNORMAL LOW (ref 12.0–15.0)

## 2018-07-13 LAB — IRON AND TIBC
Iron: 19 ug/dL — ABNORMAL LOW (ref 28–170)
Saturation Ratios: 8 % — ABNORMAL LOW (ref 10.4–31.8)
TIBC: 230 ug/dL — ABNORMAL LOW (ref 250–450)
UIBC: 211 ug/dL

## 2018-07-13 LAB — FERRITIN: Ferritin: 94 ng/mL (ref 11–307)

## 2018-07-13 MED ORDER — EPOETIN ALFA 10000 UNIT/ML IJ SOLN
INTRAMUSCULAR | Status: AC
  Administered 2018-07-13: 14:00:00 10000 [IU] via SUBCUTANEOUS
  Filled 2018-07-13: qty 1

## 2018-07-13 MED ORDER — EPOETIN ALFA 10000 UNIT/ML IJ SOLN
10000.0000 [IU] | INTRAMUSCULAR | Status: DC
  Administered 2018-07-13: 14:00:00 10000 [IU] via SUBCUTANEOUS

## 2018-07-13 NOTE — Telephone Encounter (Signed)
Yes she was supposed to have PV angiogram, but we could not get in touch with her despite multiple attempts to have it scheduled. She needs to be seen.

## 2018-07-13 NOTE — Telephone Encounter (Signed)
Pt states that she was supposed to have a test/procedure? Done back in feb/march and she was unable to do. She is now having left foot swelling and pain and her foot has turned dark in color. Please review and advise.//ah

## 2018-07-13 NOTE — Telephone Encounter (Signed)
Pt aware. Call transferred to [facility] to schedule.//ah

## 2018-07-14 NOTE — Progress Notes (Signed)
Primary Physician:  Nolene Ebbs, MD   Patient ID: [name], female    DOB: March 13, 1950, 68 y.o.   MRN: [id number]  Subjective:    Chief Complaint  Patient presents with  . Edema    leg pain  . Follow-up    HPI: [name]  is a 68 y.o. female  with type 2 diabetes, anemia likely from CKD with regular Procrit injections , chronic pancreatitis, CKD stage III, COPD, carotid stenosis diagnosed in October 2018, hypertension, PAD by LE duplex in Feb 2020, and hyperlipidemia.  She was initially evaluated by [place] for preoperative risk stratification for right foot diabetic foot ulceration debridement with Dr. March Rummage, in which was performed on 03/24/2018. She was also scheduled for laryngoscopy for tonsillar nodule with Dr. Reginia Naas; however, patient did not show for her procedure. Lexiscan nuclear stress test in July 2019 was considered low risk study. Echo performed also at that time showed normal LVEF with moderate AR. ABIin July 2019 showed severely abnormal waveforms and moderateley decreased ABI on the left at 0.5 and moderatley abnormal waveform on the right and mild decrease in ABI. Initially, we had recommended medical therapy; however, due to persistent right foot ulceration and mild symptoms, she underwent LE arterial duplex on 04/21/2018 that showed bilateral diffuse plaque with severely abnormal waveform of left LE and moderately abnormal waveform of right LE. ABI was 0.93 on right and 0.98 on left, but felt to be falsely elevated in view of diabetes. She is here to discuss results.  She has history of chronic pancreatitis and has had several admissions over the last few months with most recent admission in April 2020 for acute on chronic pancreatitis. She is on Creon and PPI. Has not had any abdominal pain or back pain since discharge. LFT's were elevated during hospitalization; however, trended down at discharge. She continues to be on statin therapy.   She continues to  have non-healing ulceration on her right foot requiring frequent debridement. States that Dr. March Rummage is wanting to do surgery to remove "bone in her foot" to help with her callous, but would like for diabetes to be better controlled. In view of abnormal LE arterial duplex and ulceration, I had recommended proceeding with PV angiogram at her last appt; however, patient did not schedule. She called our office this week stating that she is now have left foot swelling and pain and her foot has turned dark in color. Here for follow up. It appears that she has seen Dr. March Rummage on 05/14 for tendinitis of her left foot.  Symptoms started one month ago with swelling in her right knee and mild swelling in her left foot; however, symptoms recently worsened over the last week. She reports tenderness and swelling to both areas and did not sleep well last night because of this. She has difficulty with moving her right knee. No long travel or fever. Swelling does improve with elevation.   Past Medical History:  Diagnosis Date  . ANEMIA, CHRONIC DISEASE NEC 03/15/2006  . Carotid artery occlusion   . Cirrhosis ([place])   . Colon polyps   . Constipation due to opioid therapy 06/05/2014  . DEGENERATIVE JOINT DISEASE 05/06/2007   On going for >5 yrs No imaging to confirm Has tried ultram, mobic, neurontin which did not work Ambulance person tried once worked well    . Diabetes mellitus    Type II  . DIABETIC  RETINOPATHY 03/15/2006  . 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 Diastolic  dysfunction 01/23/2015   Grade 1 by Echocardiogram 12/13/14  . Elevated liver enzymes   . Heart murmur   . Hemorrhoids   . History of kidney stones   . Hyperlipidemia 04/01/2011  . Hypertension   . Mild AI (aortic insufficiency)   . Moderate aortic regurgitation 09/25/2008   12/13/14 Echocardiogram    . Myalgia   . PAD (peripheral artery disease) ([place])   . PANCREATITIS, CHRONIC 03/29/2006  . Peripheral arterial disease ([place])   . Pneumonia     age 6-20  . RENAL INSUFFICIENCY, CHRONIC 03/15/2006  . Tonsillar mass    Left    Past Surgical History:  Procedure Laterality Date  . BREAST SURGERY    . COLONOSCOPY    . DILATION AND CURETTAGE OF UTERUS N/A 06/16/2016   Procedure: DILATATION AND CURETTAGE;  Surgeon: Mora Bellman, MD;  Location: [facility];  Service: Gynecology;  Laterality: N/A;  . HYSTEROSCOPY N/A 06/16/2016   Procedure: HYSTEROSCOPY;  Surgeon: Mora Bellman, MD;  Location: [facility];  Service: Gynecology;  Laterality: N/A;  . MOUTH SURGERY N/A   . POLYPECTOMY N/A 06/16/2016   Procedure: POLYPECTOMY;  Surgeon: Mora Bellman, MD;  Location: [facility];  Service: Gynecology;  Laterality: N/A;    Social History   Socioeconomic History  . Marital status: Single    Spouse name: Not on file  . Number of children: 3  . Years of education: Not on file  . Highest education level: Not on file  Occupational History  . Occupation: retired- disabled  Social Needs  . Financial resource strain: Not on file  . Food insecurity:    Worry: Not on file    Inability: Not on file  . Transportation needs:    Medical: Not on file    Non-medical: Not on file  Tobacco Use  . Smoking status: Former Smoker    Packs/day: 0.30    Years: 44.00    Pack years: 13.20    Types: Cigarettes    Last attempt to quit: 03/2017    Years since quitting: 1.3  . Smokeless tobacco: Never Used  . Tobacco comment: started smoking in Nov 2019 few cigarettes a day  Substance and Sexual Activity  . Alcohol use: Not Currently    Alcohol/week: 0.0 standard drinks  . Drug use: No  . Sexual activity: Not on file  Lifestyle  . Physical activity:    Days per week: Not on file    Minutes per session: Not on file  . Stress: Not on file  Relationships  . Social connections:    Talks on phone: Not on file    Gets together: Not on file    Attends religious service: Not on file    Active member of club or organization: Not on file    Attends meetings of  clubs or organizations: Not on file    Relationship status: Not on file  . Intimate partner violence:    Fear of current or ex partner: Not on file    Emotionally abused: Not on file    Physically abused: Not on file    Forced sexual activity: Not on file  Other Topics Concern  . Not on file  Social History Narrative  . Not on file    Review of Systems  Constitution: Negative for decreased appetite, malaise/fatigue, weight gain and weight loss.  Eyes: Negative for visual disturbance.  Cardiovascular: Negative for chest pain, claudication, dyspnea on exertion, leg swelling, orthopnea, palpitations and syncope.  Respiratory:  Negative for hemoptysis and wheezing.   Endocrine: Negative for cold intolerance and heat intolerance.  Hematologic/Lymphatic: Does not bruise/bleed easily.  Skin: Positive for poor wound healing. Negative for nail changes.  Musculoskeletal: Positive for joint pain (right foot). Negative for back pain, muscle weakness and myalgias.  Gastrointestinal: Negative for abdominal pain, change in bowel habit, nausea and vomiting.  Neurological: Negative for difficulty with concentration, dizziness, focal weakness and headaches.  Psychiatric/Behavioral: Negative for altered mental status and suicidal ideas.  All other systems reviewed and are negative.     Objective:  Blood pressure 139/63, pulse 96, temperature (!) 96.6 F (35.9 C), height 5\' 5"  (1.651 m), weight 134 lb 8 oz (61 kg), last menstrual period 02/22/2016, SpO2 100 %. Body mass index is 22.38 kg/m.    Physical Exam  Constitutional: She is oriented to person, place, and time. Vital signs are normal. She appears well-developed and well-nourished.  HENT:  Head: Normocephalic and atraumatic.  Neck: Normal range of motion. Carotid bruit is present.  Cardiovascular: Normal rate, regular rhythm and intact distal pulses.  Murmur heard.  Harsh midsystolic murmur is present with a grade of 2/6 at the upper right  sternal border radiating to the neck. Pulses:      Carotid pulses are on the right side with bruit and on the left side with bruit.      Femoral pulses are 2+ on the right side and 1+ on the left side with bruit.      Popliteal pulses are 0 on the right side and 0 on the left side.       Dorsalis pedis pulses are 2+ on the right side and 2+ on the left side.       Posterior tibial pulses are 0 on the right side and 0 on the left side.  +1 pitting edema right +2 pitting edema left Right foot ulceration bottom mid foot Erythema to bilateral LE Tenderness to right ankle   Pulmonary/Chest: Effort normal and breath sounds normal. No accessory muscle usage. No respiratory distress.  Abdominal: Soft. Bowel sounds are normal.  Musculoskeletal: Normal range of motion.     Right knee: She exhibits swelling, effusion and erythema.     Left ankle: She exhibits swelling. Tenderness.  Neurological: She is alert and oriented to person, place, and time.  Skin: Skin is warm and dry.  Right mid foot ulceration covered by dsg. Callus bilateral with thick rigid nails  Vitals reviewed.  Radiology: No results found.  Laboratory examination:    CMP Latest Ref Rng & Units 05/28/2018 05/27/2018 05/26/2018  Glucose 70 - 99 mg/dL 62(L) - 144(H)  BUN 8 - 23 mg/dL 15 - 30(H)  Creatinine 0.44 - 1.00 mg/dL 0.97 - 1.17(H)  Sodium 135 - 145 mmol/L 138 - 133(L)  Potassium 3.5 - 5.1 mmol/L 3.9 - 4.6  Chloride 98 - 111 mmol/L 105 - 106  CO2 22 - 32 mmol/L 24 - 22  Calcium 8.9 - 10.3 mg/dL 8.6(L) - 8.9  Total Protein 6.5 - 8.1 g/dL 6.7 6.9 8.4(H)  Total Bilirubin 0.3 - 1.2 mg/dL 0.4 0.4 0.4  Alkaline Phos 38 - 126 U/L 134(H) 145(H) 166(H)  AST 15 - 41 U/L 35 52(H) 49(H)  ALT 0 - 44 U/L 52(H) 63(H) 78(H)   CBC Latest Ref Rng & Units 07/13/2018 06/29/2018 06/15/2018  WBC 4.0 - 10.5 K/uL - - -  Hemoglobin 12.0 - 15.0 g/dL 9.8(L) 9.8(L) 9.5(L)  Hematocrit 36.0 - 46.0 % - - -  Platelets 150 - 400 K/uL - - -   Lipid  Panel     Component Value Date/Time   CHOL 137 08/15/2015 1343   TRIG 78 08/15/2015 1343   HDL 99 08/15/2015 1343   CHOLHDL 1.4 08/15/2015 1343   CHOLHDL 2.5 12/11/2013 1008   VLDL 26 12/11/2013 1008   LDLCALC 22 08/15/2015 1343   HEMOGLOBIN A1C Lab Results  Component Value Date   HGBA1C 8.7 (H) 06/11/2017   MPG 202.99 06/11/2017   TSH No results for input(s): TSH in the last 8760 hours.  PRN Meds:. There are no discontinued medications. Current Meds  Medication Sig  . amLODipine (NORVASC) 10 MG tablet Take 10 mg by mouth daily.  Marland Kitchen aspirin EC 81 MG tablet Take 1 tablet (81 mg total) by mouth daily.  Marland Kitchen LINZESS 145 MCG CAPS capsule Take 145 mcg by mouth daily.   . meclizine (ANTIVERT) 25 MG tablet Take 25 mg by mouth [name] 8 (eight) hours as needed.  Marland Kitchen omeprazole (PRILOSEC) 40 MG capsule Take 40 mg by mouth daily.  . Pancrelipase, Lip-Prot-Amyl, 24000-76000 units CPEP Take 4 capsules (96,000 Units total) by mouth See admin instructions. Take 4 capsules by mouth 2-3 times daily before meals (Patient taking differently: Take 2-4 capsules by mouth See admin instructions. Take 4 capsules by mouth two to three times a day with meals and 2 capsules with any snacks)  . PROAIR HFA 108 (90 Base) MCG/ACT inhaler Inhale 2 puffs into the lungs 4 (four) times daily as needed for wheezing or shortness of breath.  . rosuvastatin (CRESTOR) 20 MG tablet TAKE 1 TABLET BY MOUTH [name] DAY (Patient taking differently: Take 20 mg by mouth daily. )  . sucralfate (CARAFATE) 1 g tablet Take 1 tablet (1 g total) by mouth 3 (three) times daily after meals. (Patient taking differently: Take 1 g by mouth daily as needed. )  . traMADol (ULTRAM) 50 MG tablet Take 1 tablet (50 mg total) by mouth [name] 6 (six) hours as needed for severe pain.  . TRESIBA FLEXTOUCH 100 UNIT/ML SOPN FlexTouch Pen Inject 10 Units into the skin daily.    Cardiac Studies:   Echocardiogram 09/21/2017: Left ventricle cavity is normal  in size. Severe concentric hypertrophy of the left ventricle, both septal and posterior wall measuring 1.7 cm in AP dimension. Normal global wall motion. Grade II diastolic dysfunction. Elevated LAP. Calculated EF 59%. Left atrial cavity is mildly dilated. Mild aortic valve leaflet thickening. Moderate (Grade III) aortic regurgitation. Moderate (Grade II) mitral regurgitation. Moderate tricuspid regurgitation. Estimated pulmonary artery systolic pressure 31 mmHg.  Lexiscan myoview stress test 09/06/2017: 1. Lexiscan stress test was performed. Exercise capacity was not assessed. No stress symptoms reported. Peak blood pressure was 148/88 mmHg. The resting and stress electrocardiogram demonstrated normal sinus rhythm, normal resting conduction, no resting arrhythmias and normal rest repolarization. Possible old anteroseptal infarct. Stress EKG is non diagnostic for ischemia as it is a pharmacologic stress. 2. The overall quality of the study is excellent. There is no evidence of abnormal lung activity. Stress and rest SPECT images demonstrate homogeneous tracer distribution throughout the myocardium. Gated SPECT imaging reveals normal myocardial thickening and wall motion. The left ventricular ejection fraction was normal (74%). 3. Low risk study.  ABI 09/22/2017: Moderately abnormal waveforms of the right ankle. Severely abnormal waveforms of the left ankle. Mildly decreased right resting ABI at o.96 and moderately decreased left resting ABI at 0.59.  Carotid artery duplex 09/21/2017: Minimal stenosis in the right  internal carotid artery (1-15%) with heterogeneous plaque Antegrade right vertebral artery flow. Antegrade left vertebral artery flow.  Assessment:   Peripheral arterial disease (HCC)  Pain and swelling of right knee  Left ankle swelling  Midfoot ulceration, right, with unspecified severity ([place])  Uncontrolled type 2 diabetes mellitus with chronic kidney disease, without  long-term current use of insulin ([place])    Recommendations:   Patient was seen for acute visit today for left lower extremity swelling and right knee swelling.  She was concerned about vascular etiology as she did not have PV angiogram performed that was previously recommended.  While I do continue to feel that she needs PV angiogram for nonhealing right foot ulceration, suspect her acute symptoms are related to possible acute inflammatory arthritis versus gout.  She is to see Dr. March Rummage this afternoon for follow-up.  There is no evidence of ischemic limb.  Has bounding pulses which indicative of calcified arteries.  No new ulcerations.  She does have uncontrolled type 2 diabetes that is also likely contributing to her ulcerations; however, has abnormal lower extremity arterial duplex indicative of PAD as well as other risk factors for the same.  We will schedule for PV angiogram in 2 weeks, but if she does not have improvement in her acute symptoms, she is instructed to contact us in 1 week and can postpone her angiogram until she has recovered.  We will see her back after procedure for further recommendations and reevaluation.   *I have discussed this case with Dr. Virgina Jock and he personally examined the patient and participated in formulating the plan.*    Miquel Dunn, MSN, APRN, FNP-C [facility]. [place] Office: [phone] Fax: [phone]

## 2018-07-15 ENCOUNTER — Encounter: Payer: Self-pay | Admitting: Cardiology

## 2018-07-15 ENCOUNTER — Other Ambulatory Visit: Payer: Self-pay

## 2018-07-15 ENCOUNTER — Encounter: Payer: Self-pay | Admitting: Podiatry

## 2018-07-15 ENCOUNTER — Telehealth: Payer: Self-pay | Admitting: *Deleted

## 2018-07-15 ENCOUNTER — Ambulatory Visit (INDEPENDENT_AMBULATORY_CARE_PROVIDER_SITE_OTHER): Payer: Medicare Other | Admitting: Podiatry

## 2018-07-15 ENCOUNTER — Ambulatory Visit (INDEPENDENT_AMBULATORY_CARE_PROVIDER_SITE_OTHER): Payer: Medicare Other | Admitting: Cardiology

## 2018-07-15 VITALS — BP 139/63 | HR 96 | Temp 96.6°F | Ht 65.0 in | Wt 134.5 lb

## 2018-07-15 VITALS — Temp 98.4°F

## 2018-07-15 DIAGNOSIS — M25472 Effusion, left ankle: Secondary | ICD-10-CM | POA: Diagnosis not present

## 2018-07-15 DIAGNOSIS — M25561 Pain in right knee: Secondary | ICD-10-CM | POA: Diagnosis not present

## 2018-07-15 DIAGNOSIS — R600 Localized edema: Secondary | ICD-10-CM

## 2018-07-15 DIAGNOSIS — E1122 Type 2 diabetes mellitus with diabetic chronic kidney disease: Secondary | ICD-10-CM

## 2018-07-15 DIAGNOSIS — I739 Peripheral vascular disease, unspecified: Secondary | ICD-10-CM

## 2018-07-15 DIAGNOSIS — M25461 Effusion, right knee: Secondary | ICD-10-CM

## 2018-07-15 DIAGNOSIS — L97419 Non-pressure chronic ulcer of right heel and midfoot with unspecified severity: Secondary | ICD-10-CM

## 2018-07-15 DIAGNOSIS — I809 Phlebitis and thrombophlebitis of unspecified site: Secondary | ICD-10-CM

## 2018-07-15 DIAGNOSIS — M79605 Pain in left leg: Secondary | ICD-10-CM

## 2018-07-15 DIAGNOSIS — E1165 Type 2 diabetes mellitus with hyperglycemia: Secondary | ICD-10-CM

## 2018-07-15 DIAGNOSIS — IMO0002 Reserved for concepts with insufficient information to code with codable children: Secondary | ICD-10-CM

## 2018-07-15 MED ORDER — TRAMADOL HCL 50 MG PO TABS: 50.0000 mg | ORAL_TABLET | Freq: Four times a day (QID) | ORAL | 0 refills | Status: DC | PRN

## 2018-07-15 NOTE — Telephone Encounter (Signed)
I called [facility] and she states she will check if they can get pt in today for Venous doppler left leg r/o DVT. [facility] states they are unable to schedule pt today. I informed Dr. March Rummage and offered to send pt to [facility] ED. I informed pt she should go to [facility] ED due to the possibility of a blood clot in her left leg. Pt states she just left [facility] and they had seen the leg and told her not to come back until the right knee had swelled. I informed Dr.Price and he states he will talk to the pt, and to go ahead and send in the venous doppler to [facility] to be performed next week.

## 2018-07-15 NOTE — Progress Notes (Signed)
Subjective:  Patient ID: [name], female    DOB: [birth year],  MRN: [id number]  Chief Complaint  Patient presents with  . Ankle Pain    Follow up tendonitis left ankle   "I think it looks worse"    68 y.o. female presents with the above complaint. Thinks the area at her left leg is worsened, now more painful. Denies chest pain nor shortness of breath. Saw her cardiologist today who did not think her  Review of Systems: Negative except as noted in the HPI. Denies N/V/F/Ch.  Past Medical History:  Diagnosis Date  . ANEMIA, CHRONIC DISEASE NEC 03/15/2006  . Carotid artery occlusion   . Cirrhosis ([place])   . Colon polyps   . Constipation due to opioid therapy 06/05/2014  . DEGENERATIVE JOINT DISEASE 05/06/2007   On going for >5 yrs No imaging to confirm Has tried ultram, mobic, neurontin which did not work Ambulance person tried once worked well    . Diabetes mellitus    Type II  . DIABETIC  RETINOPATHY 03/15/2006  . 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 Diastolic dysfunction 33/54/5625   Grade 1 by Echocardiogram 12/13/14  . Elevated liver enzymes   . Heart murmur   . Hemorrhoids   . History of kidney stones   . Hyperlipidemia 04/01/2011  . Hypertension   . Mild AI (aortic insufficiency)   . Moderate aortic regurgitation 09/25/2008   12/13/14 Echocardiogram    . Myalgia   . PAD (peripheral artery disease) ([place])   . PANCREATITIS, CHRONIC 03/29/2006  . Peripheral arterial disease ([place])   . Pneumonia    age 65-20  . RENAL INSUFFICIENCY, CHRONIC 03/15/2006  . Tonsillar mass    Left    Current Outpatient Medications:  .  ACCU-CHEK AVIVA PLUS test strip, TEST three times a day, Disp: 100 each, Rfl: 11 .  amLODipine (NORVASC) 10 MG tablet, Take 10 mg by mouth daily., Disp: , Rfl:  .  aspirin EC 81 MG tablet, Take 1 tablet (81 mg total) by mouth daily., Disp: , Rfl:  .  Blood Glucose Monitoring Suppl (ACCU-CHEK AVIVA PLUS) w/Device KIT, Use to check you blood sugar 3 times a day,  Disp: 1 kit, Rfl: 1 .  LINZESS 145 MCG CAPS capsule, Take 145 mcg by mouth daily. , Disp: , Rfl:  .  meclizine (ANTIVERT) 25 MG tablet, Take 25 mg by mouth [name] 8 (eight) hours as needed., Disp: , Rfl:  .  omeprazole (PRILOSEC) 40 MG capsule, Take 40 mg by mouth daily., Disp: , Rfl:  .  Pancrelipase, Lip-Prot-Amyl, 24000-76000 units CPEP, Take 4 capsules (96,000 Units total) by mouth See admin instructions. Take 4 capsules by mouth 2-3 times daily before meals (Patient taking differently: Take 2-4 capsules by mouth See admin instructions. Take 4 capsules by mouth two to three times a day with meals and 2 capsules with any snacks), Disp: 1170 capsule, Rfl: 3 .  PROAIR HFA 108 (90 Base) MCG/ACT inhaler, Inhale 2 puffs into the lungs 4 (four) times daily as needed for wheezing or shortness of breath., Disp: , Rfl: 3 .  rosuvastatin (CRESTOR) 20 MG tablet, TAKE 1 TABLET BY MOUTH [name] DAY (Patient taking differently: Take 20 mg by mouth daily. ), Disp: 90 tablet, Rfl: 3 .  sucralfate (CARAFATE) 1 g tablet, Take 1 tablet (1 g total) by mouth 3 (three) times daily after meals. (Patient taking differently: Take 1 g by mouth daily as needed. ), Disp: 60 tablet, Rfl: 0 .  traMADol (ULTRAM) 50 MG tablet, Take 1 tablet (50 mg total) by mouth [name] 6 (six) hours as needed for severe pain., Disp: 8 tablet, Rfl: 0 .  TRESIBA FLEXTOUCH 100 UNIT/ML SOPN FlexTouch Pen, Inject 10 Units into the skin daily., Disp: , Rfl:   Social History   Tobacco Use  Smoking Status Former Smoker  . Packs/day: 0.30  . Years: 44.00  . Pack years: 13.20  . Types: Cigarettes  . Last attempt to quit: 03/2017  . Years since quitting: 1.3  Smokeless Tobacco Never Used  Tobacco Comment   started smoking in Nov 2019 few cigarettes a day    Allergies  Allergen Reactions  . Atorvastatin Other (See Comments)    Caused muscles in her back to be very sore  . Gabapentin Swelling    Legs and feet swell  . Nicorette [Nicotine] Nausea  Only   Objective:   Vitals:   07/15/18 1328  Temp: 98.4 F (36.9 C)   There is no height or weight on file to calculate BMI. Constitutional Well developed. Well nourished.  Vascular Dorsalis pedis pulses palpable bilaterally. Posterior tibial pulses non-palpable bilaterally. Capillary refill normal to all digits.  No cyanosis or clubbing noted. Pedal hair growth normal.  Neurologic Normal speech. Oriented to person, place, and time. Epicritic sensation to light touch grossly present bilaterally.  Dermatologic Nails well groomed and normal in appearance. No skin lesions. Would R 5th MPJ  0.5 x 0.5 post-debridement. Punctate keratoses sub-met 1 5 heel left, first MPJ right  Orthopedic: L leg bruising and edema with pain to palpation. Worsening darkening, edema. Skin taut. Pitting edema.    Radiographs: None today Assessment:   1. Thrombophlebitis   2. Pain in left leg   3. Localized edema    Plan:  Patient was evaluated and treated and all questions answered.  Thrombophlebitis L Leg -Worsening edema, warmth left leg concerning for possible thrombophlebitis -Order [place] L Leg for further eval. -Further workup pending [place] results. -F/u next week  Return in about 1 week (around 07/22/2018).

## 2018-07-15 NOTE — Telephone Encounter (Signed)
FAXED ORDERS TO [facility].

## 2018-07-19 ENCOUNTER — Ambulatory Visit: Payer: Medicare Other | Admitting: Cardiology

## 2018-07-22 ENCOUNTER — Other Ambulatory Visit: Payer: Self-pay

## 2018-07-22 ENCOUNTER — Telehealth: Payer: Self-pay | Admitting: *Deleted

## 2018-07-22 ENCOUNTER — Ambulatory Visit: Payer: Medicare Other | Admitting: Podiatry

## 2018-07-22 ENCOUNTER — Ambulatory Visit (HOSPITAL_COMMUNITY)
Admission: RE | Admit: 2018-07-22 | Discharge: 2018-07-22 | Disposition: A | Discharge status: Home / Self Care | Payer: Medicare Other | Source: Ambulatory Visit | Attending: Internal Medicine | Admitting: Internal Medicine

## 2018-07-22 DIAGNOSIS — M79605 Pain in left leg: Secondary | ICD-10-CM | POA: Diagnosis present

## 2018-07-22 DIAGNOSIS — R600 Localized edema: Secondary | ICD-10-CM | POA: Diagnosis not present

## 2018-07-22 DIAGNOSIS — I809 Phlebitis and thrombophlebitis of unspecified site: Secondary | ICD-10-CM | POA: Diagnosis present

## 2018-07-22 NOTE — Telephone Encounter (Signed)
[name] states pt is negative for DVT, does have superficial swelling at ankle. I told [name] to inform pt and release from their office.

## 2018-07-23 NOTE — Telephone Encounter (Signed)
Noted. Thanks.

## 2018-07-24 NOTE — Progress Notes (Signed)
Subjective:  Patient ID: [name], female    DOB: 01/19/1951,  MRN: [id number]  Chief Complaint  Patient presents with  . Diabetic Ulcer    follow up right foot  . Foot Pain    left ankle pain and swelling    68 y.o. female presents with the above complaint. States the right foot ulcer is very painful.  Also complains of new pain to the top of the left ankle. Review of Systems: Negative except as noted in the HPI. Denies N/V/F/Ch.  Past Medical History:  Diagnosis Date  . ANEMIA, CHRONIC DISEASE NEC 03/15/2006  . Carotid artery occlusion   . Cirrhosis ([place])   . Colon polyps   . Constipation due to opioid therapy 06/05/2014  . DEGENERATIVE JOINT DISEASE 05/06/2007   On going for >5 yrs No imaging to confirm Has tried ultram, mobic, neurontin which did not work Ambulance person tried once worked well    . Diabetes mellitus    Type II  . DIABETIC  RETINOPATHY 03/15/2006  . 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 Diastolic dysfunction 18/84/1660   Grade 1 by Echocardiogram 12/13/14  . Elevated liver enzymes   . Heart murmur   . Hemorrhoids   . History of kidney stones   . Hyperlipidemia 04/01/2011  . Hypertension   . Mild AI (aortic insufficiency)   . Moderate aortic regurgitation 09/25/2008   12/13/14 Echocardiogram    . Myalgia   . PAD (peripheral artery disease) ([place])   . PANCREATITIS, CHRONIC 03/29/2006  . Peripheral arterial disease ([place])   . Pneumonia    age 65-21  . RENAL INSUFFICIENCY, CHRONIC 03/15/2006  . Tonsillar mass    Left    Current Outpatient Medications:  .  ACCU-CHEK AVIVA PLUS test strip, TEST three times a day, Disp: 100 each, Rfl: 11 .  amLODipine (NORVASC) 10 MG tablet, Take 10 mg by mouth daily., Disp: , Rfl:  .  aspirin EC 81 MG tablet, Take 1 tablet (81 mg total) by mouth daily., Disp: , Rfl:  .  Blood Glucose Monitoring Suppl (ACCU-CHEK AVIVA PLUS) w/Device KIT, Use to check you blood sugar 3 times a day, Disp: 1 kit, Rfl: 1 .  LINZESS 145 MCG CAPS  capsule, Take 145 mcg by mouth daily. , Disp: , Rfl:  .  meclizine (ANTIVERT) 25 MG tablet, Take 25 mg by mouth [name] 8 (eight) hours as needed., Disp: , Rfl:  .  omeprazole (PRILOSEC) 40 MG capsule, Take 40 mg by mouth daily., Disp: , Rfl:  .  Pancrelipase, Lip-Prot-Amyl, 24000-76000 units CPEP, Take 4 capsules (96,000 Units total) by mouth See admin instructions. Take 4 capsules by mouth 2-3 times daily before meals (Patient taking differently: Take 2-4 capsules by mouth See admin instructions. Take 4 capsules by mouth two to three times a day with meals and 2 capsules with any snacks), Disp: 1170 capsule, Rfl: 3 .  PROAIR HFA 108 (90 Base) MCG/ACT inhaler, Inhale 2 puffs into the lungs 4 (four) times daily as needed for wheezing or shortness of breath., Disp: , Rfl: 3 .  rosuvastatin (CRESTOR) 20 MG tablet, TAKE 1 TABLET BY MOUTH [name] DAY (Patient taking differently: Take 20 mg by mouth daily. ), Disp: 90 tablet, Rfl: 3 .  sucralfate (CARAFATE) 1 g tablet, Take 1 tablet (1 g total) by mouth 3 (three) times daily after meals. (Patient taking differently: Take 1 g by mouth daily as needed. ), Disp: 60 tablet, Rfl: 0 .  traMADol (ULTRAM) 50  MG tablet, Take 1 tablet (50 mg total) by mouth [name] 6 (six) hours as needed for severe pain., Disp: 8 tablet, Rfl: 0 .  TRESIBA FLEXTOUCH 100 UNIT/ML SOPN FlexTouch Pen, Inject 10 Units into the skin daily., Disp: , Rfl:   Social History   Tobacco Use  Smoking Status Former Smoker  . Packs/day: 0.30  . Years: 44.00  . Pack years: 13.20  . Types: Cigarettes  . Last attempt to quit: 03/2017  . Years since quitting: 1.3  Smokeless Tobacco Never Used  Tobacco Comment   started smoking in Nov 2019 few cigarettes a day    Allergies  Allergen Reactions  . Atorvastatin Other (See Comments)    Caused muscles in her back to be very sore  . Gabapentin Swelling    Legs and feet swell  . Nicorette [Nicotine] Nausea Only   Objective:   There were no vitals  filed for this visit. There is no height or weight on file to calculate BMI. Constitutional Well developed. Well nourished.  Vascular Dorsalis pedis pulses palpable bilaterally. Posterior tibial pulses non-palpable bilaterally. Capillary refill normal to all digits.  No cyanosis or clubbing noted. Pedal hair growth normal.  Neurologic Normal speech. Oriented to person, place, and time. Epicritic sensation to light touch grossly present bilaterally.  Dermatologic Nails well groomed and normal in appearance. No skin lesions. Would R 5th MPJ  0.2x0.2x0.1. Punctate keratoses sub-met 1 5 heel left, first MPJ right  Orthopedic: Normal joint ROM without pain or crepitus bilaterally. No visible deformities. Pain on palpation right fifth MPJ Left anterior ankle contusion with local warmth   Radiographs: X-rays left ankle no acute fracture dislocations  Assessment:   1. Tendinitis of ankle or foot   2. Diabetic ulcer of right midfoot associated with diabetes mellitus due to underlying condition, limited to breakdown of skin ([place])   3. Localized edema    Plan:  Patient was evaluated and treated and all questions answered.  Ulcer R 5th MPJ -Still pending surgery. -Ulcer debrided as below -Continue offloading inserts.  Procedure: Selective Debridement of Wound Rationale: Removal of devitalized tissue from the wound to promote healing.  Pre-Debridement Wound Measurements: 0.2 cm x 0.2 cm x 0.1 cm  Post-Debridement Wound Measurements: same as pre-debridement. Type of Debridement: sharp selective Tissue Removed: Devitalized soft-tissue Dressing: Dry, sterile, compression dressing. Disposition: Patient tolerated procedure well. Patient to return in 1 week for follow-up.     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

## 2018-07-26 ENCOUNTER — Other Ambulatory Visit: Payer: Self-pay | Admitting: Cardiology

## 2018-07-26 DIAGNOSIS — I739 Peripheral vascular disease, unspecified: Secondary | ICD-10-CM

## 2018-07-26 DIAGNOSIS — I10 Essential (primary) hypertension: Secondary | ICD-10-CM

## 2018-07-26 NOTE — Progress Notes (Unsigned)
novel 

## 2018-07-26 NOTE — Progress Notes (Signed)
BMP and CBC placed for procedure

## 2018-07-27 ENCOUNTER — Ambulatory Visit (HOSPITAL_COMMUNITY)
Admission: RE | Admit: 2018-07-27 | Discharge: 2018-07-27 | Disposition: A | Discharge status: Home / Self Care | Payer: Medicare Other | Source: Ambulatory Visit | Attending: Nephrology | Admitting: Nephrology

## 2018-07-27 VITALS — BP 109/54 | HR 76 | Temp 97.3°F | Resp 18

## 2018-07-27 DIAGNOSIS — D638 Anemia in other chronic diseases classified elsewhere: Secondary | ICD-10-CM | POA: Diagnosis present

## 2018-07-27 LAB — POCT HEMOGLOBIN-HEMACUE: Hemoglobin: 9.6 g/dL — ABNORMAL LOW (ref 12.0–15.0)

## 2018-07-27 MED ORDER — EPOETIN ALFA 10000 UNIT/ML IJ SOLN
10000.0000 [IU] | INTRAMUSCULAR | Status: DC
  Administered 2018-07-27: 10000 [IU] via SUBCUTANEOUS

## 2018-07-27 MED ORDER — EPOETIN ALFA 10000 UNIT/ML IJ SOLN
INTRAMUSCULAR | Status: AC
  Filled 2018-07-27: qty 1

## 2018-07-31 ENCOUNTER — Other Ambulatory Visit: Payer: Self-pay

## 2018-07-31 ENCOUNTER — Emergency Department (HOSPITAL_COMMUNITY)
Admission: EM | Admit: 2018-07-31 | Discharge: 2018-08-01 | Disposition: A | Discharge status: Home / Self Care | Payer: Medicare Other | Attending: Emergency Medicine | Admitting: Emergency Medicine

## 2018-07-31 ENCOUNTER — Encounter (HOSPITAL_COMMUNITY): Payer: Self-pay

## 2018-07-31 DIAGNOSIS — Z87891 Personal history of nicotine dependence: Secondary | ICD-10-CM | POA: Insufficient documentation

## 2018-07-31 DIAGNOSIS — E1122 Type 2 diabetes mellitus with diabetic chronic kidney disease: Secondary | ICD-10-CM | POA: Diagnosis not present

## 2018-07-31 DIAGNOSIS — I129 Hypertensive chronic kidney disease with stage 1 through stage 4 chronic kidney disease, or unspecified chronic kidney disease: Secondary | ICD-10-CM | POA: Insufficient documentation

## 2018-07-31 DIAGNOSIS — Z79899 Other long term (current) drug therapy: Secondary | ICD-10-CM | POA: Insufficient documentation

## 2018-07-31 DIAGNOSIS — N939 Abnormal uterine and vaginal bleeding, unspecified: Secondary | ICD-10-CM | POA: Diagnosis not present

## 2018-07-31 DIAGNOSIS — Z7982 Long term (current) use of aspirin: Secondary | ICD-10-CM | POA: Insufficient documentation

## 2018-07-31 DIAGNOSIS — N183 Chronic kidney disease, stage 3 (moderate): Secondary | ICD-10-CM | POA: Insufficient documentation

## 2018-07-31 LAB — URINALYSIS, ROUTINE W REFLEX MICROSCOPIC
Bacteria, UA: NONE SEEN
Bilirubin Urine: NEGATIVE
Glucose, UA: NEGATIVE mg/dL
Hgb urine dipstick: NEGATIVE
Ketones, ur: NEGATIVE mg/dL
Leukocytes,Ua: NEGATIVE
Nitrite: NEGATIVE
Protein, ur: 100 mg/dL — AB
Specific Gravity, Urine: 1.015 (ref 1.005–1.030)
pH: 5 (ref 5.0–8.0)

## 2018-07-31 LAB — COMPREHENSIVE METABOLIC PANEL
ALT: 55 U/L — ABNORMAL HIGH (ref 0–44)
AST: 59 U/L — ABNORMAL HIGH (ref 15–41)
Albumin: 2.5 g/dL — ABNORMAL LOW (ref 3.5–5.0)
Alkaline Phosphatase: 100 U/L (ref 38–126)
Anion gap: 9 (ref 5–15)
BUN: 33 mg/dL — ABNORMAL HIGH (ref 8–23)
CO2: 20 mmol/L — ABNORMAL LOW (ref 22–32)
Calcium: 8.3 mg/dL — ABNORMAL LOW (ref 8.9–10.3)
Chloride: 108 mmol/L (ref 98–111)
Creatinine, Ser: 1.6 mg/dL — ABNORMAL HIGH (ref 0.44–1.00)
GFR calc Af Amer: 39 mL/min — ABNORMAL LOW (ref >60)
GFR calc non Af Amer: 33 mL/min — ABNORMAL LOW (ref >60)
Glucose, Bld: 182 mg/dL — ABNORMAL HIGH (ref 70–99)
Potassium: 3.6 mmol/L (ref 3.5–5.1)
Sodium: 137 mmol/L (ref 135–145)
Total Bilirubin: 0.5 mg/dL (ref 0.3–1.2)
Total Protein: 6.8 g/dL (ref 6.5–8.1)

## 2018-07-31 LAB — CBC
HCT: 30.6 % — ABNORMAL LOW (ref 36.0–46.0)
Hemoglobin: 9.4 g/dL — ABNORMAL LOW (ref 12.0–15.0)
MCH: 25.8 pg — ABNORMAL LOW (ref 26.0–34.0)
MCHC: 30.7 g/dL (ref 30.0–36.0)
MCV: 83.8 fL (ref 80.0–100.0)
Platelets: 208 10*3/uL (ref 150–400)
RBC: 3.65 MIL/uL — ABNORMAL LOW (ref 3.87–5.11)
RDW: 15.2 % (ref 11.5–15.5)
WBC: 3.4 10*3/uL — ABNORMAL LOW (ref 4.0–10.5)
nRBC: 0 % (ref 0.0–0.2)

## 2018-07-31 LAB — LIPASE, BLOOD: Lipase: 18 U/L (ref 11–51)

## 2018-07-31 MED ORDER — SODIUM CHLORIDE 0.9% FLUSH: 3.0000 mL | Freq: Once | INTRAVENOUS | Status: DC

## 2018-07-31 NOTE — ED Triage Notes (Signed)
Pt here for chronic pancreatitis. Intermittent abd pain, no n/v

## 2018-08-01 ENCOUNTER — Encounter (HOSPITAL_COMMUNITY): Payer: Self-pay | Admitting: Emergency Medicine

## 2018-08-01 ENCOUNTER — Telehealth (HOSPITAL_COMMUNITY): Payer: Self-pay | Admitting: Emergency Medicine

## 2018-08-01 ENCOUNTER — Emergency Department (HOSPITAL_COMMUNITY): Payer: Medicare Other

## 2018-08-01 DIAGNOSIS — N939 Abnormal uterine and vaginal bleeding, unspecified: Secondary | ICD-10-CM | POA: Diagnosis not present

## 2018-08-01 LAB — WET PREP, GENITAL
Clue Cells Wet Prep HPF POC: NONE SEEN
Sperm: NONE SEEN
Trich, Wet Prep: NONE SEEN
Yeast Wet Prep HPF POC: NONE SEEN

## 2018-08-01 LAB — CERVICOVAGINAL ANCILLARY ONLY
Chlamydia: NEGATIVE
Neisseria Gonorrhea: NEGATIVE

## 2018-08-01 MED ORDER — KETOROLAC TROMETHAMINE 60 MG/2ML IM SOLN
30.0000 mg | Freq: Once | INTRAMUSCULAR | Status: AC
  Administered 2018-08-01: 30 mg via INTRAMUSCULAR
  Filled 2018-08-01: qty 2

## 2018-08-01 NOTE — Telephone Encounter (Deleted)
81: ER Charge RN asked me to place an order for gonorrhea/chlamydia swab.  Lab has a swab but no orders for it.  I have reviewed patient's most recent ER visit. It appears she had a pelvic exam and wet prep sent.  Will add gc/chlamydia order.

## 2018-08-01 NOTE — Telephone Encounter (Signed)
38: ER Charge RN asked me to place an order for gonorrhea/chlamydia swab.  Lab has a swab but no orders for it.  I have reviewed patient's most recent ER visit. It appears she had a pelvic exam and wet prep sent.  Will add gc/chlamydia order.

## 2018-08-01 NOTE — ED Provider Notes (Signed)
[facility] EMERGENCY DEPARTMENT Provider Note   CSN: [id number] Arrival date & time: 07/31/18  1958    History   Chief Complaint Chief Complaint  Patient presents with  . Pancreatitis    HPI [name] is a 68 y.o. female.     The history is provided by the patient.  Vaginal Bleeding  Quality:  Bright red Severity:  Mild Onset quality:  Sudden Timing:  Rare Progression:  Resolved Chronicity:  Recurrent Menstrual history:  Postmenopausal Possible pregnancy: no   Context: after urination   Relieved by:  Nothing Worsened by:  Nothing Ineffective treatments:  None tried Associated symptoms: no abdominal pain, no back pain, no dizziness, no dyspareunia, no dysuria, no fatigue, no fever, no nausea and no vaginal discharge   Risk factors: no bleeding disorder   patient with a h/o post menopausal bleeding presents with same following urination.  She thought it was related to her pancreatitis though she has no abdominal pain at this time.  No f/c/r.  No n/v/d.  No rectal bleeding.    Past Medical History:  Diagnosis Date  . ANEMIA, CHRONIC DISEASE NEC 03/15/2006  . Carotid artery occlusion   . Cirrhosis ([place])   . Colon polyps   . Constipation due to opioid therapy 06/05/2014  . DEGENERATIVE JOINT DISEASE 05/06/2007   On going for >5 yrs No imaging to confirm Has tried ultram, mobic, neurontin which did not work Ambulance person tried once worked well    . Diabetes mellitus    Type II  . DIABETIC  RETINOPATHY 03/15/2006  . 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 Diastolic dysfunction 33/00/7622   Grade 1 by Echocardiogram 12/13/14  . Elevated liver enzymes   . Heart murmur   . Hemorrhoids   . History of kidney stones   . Hyperlipidemia 04/01/2011  . Hypertension   . Mild AI (aortic insufficiency)   . Moderate aortic regurgitation 09/25/2008   12/13/14 Echocardiogram    . Myalgia   . PAD (peripheral artery disease) ([place])   . PANCREATITIS, CHRONIC 03/29/2006   . Peripheral arterial disease ([place])   . Pneumonia    age 42-20  . RENAL INSUFFICIENCY, CHRONIC 03/15/2006  . Tonsillar mass    Left    Patient Active Problem List   Diagnosis Date Noted  . Diabetes mellitus type 2 in obese ([place]) 05/27/2018  . Acute on chronic pancreatitis ([place])   . Acute pancreatitis 05/26/2018  . Midfoot ulceration, right, with unspecified severity ([place]) 04/05/2018  . Acute pancreatitis with uninfected necrosis 04/01/2018  . Constipation 04/01/2018  . Recurrent epistaxis 05/31/2017  . Throat pain in adult 05/31/2017  . Tubular adenoma of colon 06/30/2016  . Diverticulosis of colon without hemorrhage 06/30/2016  . Postmenopausal vaginal bleeding   . Long term (current) use of opiate analgesic 04/09/2016  . Bilateral carotid artery disease ([place]) 11/28/2015  . Pancreatic insufficiency 05/09/2015  . 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 Risk for coronary artery disease greater than 20% in next 10 years 03/14/2015  . Diastolic dysfunction 63/33/5456  . Vulvar cyst 01/23/2015  . Abnormal uterine bleeding 12/02/2014  . Unexplained night sweats 12/02/2014  . Constipation due to opioid therapy 06/05/2014  . Loss of weight 03/29/2014  . Peripheral arterial disease ([place]) 12/12/2013  . GERD (gastroesophageal reflux disease) 10/12/2012  . Healthcare maintenance 09/29/2012  . Back pain 03/28/2012  . Hyperlipidemia 04/01/2011  . BOILS, RECURRENT 03/13/2010  . Moderate aortic regurgitation 09/25/2008  . HEMORRHOIDS 06/08/2008  .  Osteoarthritis 05/06/2007  . Former tobacco use 09/27/2006  . History of osteoporosis 06/29/2006  . Chronic pancreatitis ([place]) 03/29/2006  . Uncontrolled type 2 diabetes mellitus with chronic kidney disease, without long-term current use of insulin ([place]) 03/15/2006  . Diabetic neuropathy, painful ([place]) 03/15/2006  . Anemia of chronic disease 03/15/2006  . Essential hypertension 03/15/2006  . VENTRICULAR HYPERTROPHY, LEFT 03/15/2006   . CKD stage 3 due to type 2 diabetes mellitus ([place]) 03/15/2006    Past Surgical History:  Procedure Laterality Date  . BREAST SURGERY    . COLONOSCOPY    . DILATION AND CURETTAGE OF UTERUS N/A 06/16/2016   Procedure: DILATATION AND CURETTAGE;  Surgeon: Mora Bellman, MD;  Location: [facility];  Service: Gynecology;  Laterality: N/A;  . HYSTEROSCOPY N/A 06/16/2016   Procedure: HYSTEROSCOPY;  Surgeon: Mora Bellman, MD;  Location: [facility];  Service: Gynecology;  Laterality: N/A;  . MOUTH SURGERY N/A   . POLYPECTOMY N/A 06/16/2016   Procedure: POLYPECTOMY;  Surgeon: Mora Bellman, MD;  Location: [facility];  Service: Gynecology;  Laterality: N/A;     OB History    Gravida  3   Para  3   Term  3   Preterm      AB      Living  3     SAB      TAB      Ectopic      Multiple      Live Births  3            Home Medications    Prior to Admission medications   Medication Sig Start Date End Date Taking? Authorizing Provider  ACCU-CHEK AVIVA PLUS test strip TEST three times a day 11/25/15   Lucious Groves, DO  amLODipine (NORVASC) 10 MG tablet Take 10 mg by mouth daily. 06/01/17   [provider]  aspirin EC 81 MG tablet Take 1 tablet (81 mg total) by mouth daily. 04/09/16   Lucious Groves, DO  Blood Glucose Monitoring Suppl (ACCU-CHEK AVIVA PLUS) w/Device KIT Use to check you blood sugar 3 times a day 06/04/15   Oval Linsey, MD  LINZESS 145 MCG CAPS capsule Take 145 mcg by mouth daily.  03/07/17   [provider]  meclizine (ANTIVERT) 25 MG tablet Take 25 mg by mouth every 8 (eight) hours as needed. 06/11/18   [provider]  omeprazole (PRILOSEC) 40 MG capsule Take 40 mg by mouth daily. 04/16/18   [provider]  Pancrelipase, Lip-Prot-Amyl, 24000-76000 units CPEP Take 4 capsules (96,000 Units total) by mouth See admin instructions. Take 4 capsules by mouth 2-3 times daily before meals Patient taking differently: Take 2-4 capsules by mouth  See admin instructions. Take 4 capsules by mouth two to three times a day with meals and 2 capsules with any snacks 03/10/16   Lucious Groves, DO  PROAIR HFA 108 [phone] Base) MCG/ACT inhaler Inhale 2 puffs into the lungs 4 (four) times daily as needed for wheezing or shortness of breath. 04/30/17   [provider]  rosuvastatin (CRESTOR) 20 MG tablet TAKE 1 TABLET BY MOUTH EVERY DAY Patient taking differently: Take 20 mg by mouth daily.  04/04/18   Miquel Dunn, NP  sucralfate (CARAFATE) 1 g tablet Take 1 tablet (1 g total) by mouth 3 (three) times daily after meals. Patient taking differently: Take 1 g by mouth daily as needed.  04/22/18   Pyrtle, Lajuan Lines, MD  traMADol (ULTRAM) 50 MG  tablet Take 1 tablet (50 mg total) by mouth every 6 (six) hours as needed for severe pain. 07/15/18   Evelina Bucy, DPM  TRESIBA FLEXTOUCH 100 UNIT/ML SOPN FlexTouch Pen Inject 10 Units into the skin daily. 05/17/18   [provider]    Family History Family History  Problem Relation Age of Onset  . Dementia Mother   . Colon cancer Neg Hx     Social History Social History   Tobacco Use  . Smoking status: Former Smoker    Packs/day: 0.30    Years: 44.00    Pack years: 13.20    Types: Cigarettes    Last attempt to quit: 03/2017    Years since quitting: 1.3  . Smokeless tobacco: Never Used  . Tobacco comment: started smoking in Nov 2019 few cigarettes a day  Substance Use Topics  . Alcohol use: Not Currently    Alcohol/week: 0.0 standard drinks  . Drug use: No     Allergies   Atorvastatin; Gabapentin; and Nicorette [nicotine]   Review of Systems Review of Systems  Constitutional: Negative for fatigue and fever.  Respiratory: Negative for cough and shortness of breath.   Cardiovascular: Negative for chest pain.  Gastrointestinal: Negative for abdominal pain, anal bleeding, blood in stool, nausea and vomiting.  Genitourinary: Positive for vaginal bleeding. Negative for  dyspareunia, dysuria and vaginal discharge.  Musculoskeletal: Negative for back pain.  Neurological: Negative for dizziness.  All other systems reviewed and are negative.    Physical Exam Updated Vital Signs BP (!) 152/62 (BP Location: Left Arm)   Pulse 65   Temp 98.1 F (36.7 C) (Oral)   Resp 16   Ht 5' 5"  (1.651 m)   Wt 60.8 kg   LMP 02/22/2016   SpO2 99%   BMI 22.30 kg/m   Physical Exam Vitals signs and nursing note reviewed.  Constitutional:      General: She is not in acute distress.    Appearance: She is normal weight.  HENT:     Head: Normocephalic and atraumatic.     Nose: Nose normal.     Mouth/Throat:     Mouth: Mucous membranes are moist.     Pharynx: Oropharynx is clear.  Eyes:     Conjunctiva/sclera: Conjunctivae normal.     Pupils: Pupils are equal, round, and reactive to light.  Neck:     Musculoskeletal: Normal range of motion and neck supple.  Cardiovascular:     Rate and Rhythm: Normal rate and regular rhythm.     Pulses: Normal pulses.     Heart sounds: Normal heart sounds.  Pulmonary:     Effort: Pulmonary effort is normal.     Breath sounds: Normal breath sounds.  Abdominal:     General: Abdomen is flat. Bowel sounds are normal.     Tenderness: There is no abdominal tenderness. There is no guarding or rebound.  Genitourinary:    Comments: Chaperone present atrophic mucosal scant bleeding, no bleeding per os Musculoskeletal: Normal range of motion.  Skin:    General: Skin is warm and dry.     Capillary Refill: Capillary refill takes less than 2 seconds.  Neurological:     General: No focal deficit present.     Mental Status: She is alert and oriented to person, place, and time.  Psychiatric:        Mood and Affect: Mood normal.        Behavior: Behavior normal.      ED  Treatments / Results  Labs (all labs ordered are listed, but only abnormal results are displayed) Results for orders placed or performed during the hospital  encounter of 07/31/18  Lipase, blood  Result Value Ref Range   Lipase 18 11 - 51 U/L  Comprehensive metabolic panel  Result Value Ref Range   Sodium 137 135 - 145 mmol/L   Potassium 3.6 3.5 - 5.1 mmol/L   Chloride 108 98 - 111 mmol/L   CO2 20 (L) 22 - 32 mmol/L   Glucose, Bld 182 (H) 70 - 99 mg/dL   BUN 33 (H) 8 - 23 mg/dL   Creatinine, Ser 1.60 (H) 0.44 - 1.00 mg/dL   Calcium 8.3 (L) 8.9 - 10.3 mg/dL   Total Protein 6.8 6.5 - 8.1 g/dL   Albumin 2.5 (L) 3.5 - 5.0 g/dL   AST 59 (H) 15 - 41 U/L   ALT 55 (H) 0 - 44 U/L   Alkaline Phosphatase 100 38 - 126 U/L   Total Bilirubin 0.5 0.3 - 1.2 mg/dL   GFR calc non Af Amer 33 (L) >60 mL/min   GFR calc Af Amer 39 (L) >60 mL/min   Anion gap 9 5 - 15  CBC  Result Value Ref Range   WBC 3.4 (L) 4.0 - 10.5 K/uL   RBC 3.65 (L) 3.87 - 5.11 MIL/uL   Hemoglobin 9.4 (L) 12.0 - 15.0 g/dL   HCT 30.6 (L) 36.0 - 46.0 %   MCV 83.8 80.0 - 100.0 fL   MCH 25.8 (L) 26.0 - 34.0 pg   MCHC 30.7 30.0 - 36.0 g/dL   RDW 15.2 11.5 - 15.5 %   Platelets 208 150 - 400 K/uL   nRBC 0.0 0.0 - 0.2 %  Urinalysis, Routine w reflex microscopic  Result Value Ref Range   Color, Urine YELLOW YELLOW   APPearance CLEAR CLEAR   Specific Gravity, Urine 1.015 1.005 - 1.030   pH 5.0 5.0 - 8.0   Glucose, UA NEGATIVE NEGATIVE mg/dL   Hgb urine dipstick NEGATIVE NEGATIVE   Bilirubin Urine NEGATIVE NEGATIVE   Ketones, ur NEGATIVE NEGATIVE mg/dL   Protein, ur 100 (A) NEGATIVE mg/dL   Nitrite NEGATIVE NEGATIVE   Leukocytes,Ua NEGATIVE NEGATIVE   RBC / HPF 0-5 0 - 5 RBC/hpf   WBC, UA 0-5 0 - 5 WBC/hpf   Bacteria, UA NONE SEEN NONE SEEN   Squamous Epithelial / LPF 0-5 0 - 5   Dg Foot Complete Left  Result Date: 07/07/2018 Please see detailed radiograph report in office note.  Vas [place] Lower Extremity Venous (dvt)  Result Date: 07/24/2018  Lower Venous Study Indications: Pain, Swelling, Edema in left calf and ankle x 2 weeks. Patent denies any shortness of breath.   Anticoagulation: Aspirin 46m.  Comparison Study: None Performing Technologist: Alecia Mackin RVT, RDCS (AE), RDMS  Examination Guidelines: A complete evaluation includes B-mode imaging, spectral Doppler, color Doppler, and power Doppler as needed of all accessible portions of each vessel. Bilateral testing is considered an integral part of a complete examination. Limited examinations for reoccurring indications may be performed as noted.  +-----+---------------+---------+-----------+----------+-------+ RIGHTCompressibilityPhasicitySpontaneityPropertiesSummary +-----+---------------+---------+-----------+----------+-------+ CFV  Full           Yes      Yes                          +-----+---------------+---------+-----------+----------+-------+  +---------+---------------+---------+-----------+----------+-------+ LEFT     CompressibilityPhasicitySpontaneityPropertiesSummary +---------+---------------+---------+-----------+----------+-------+ CFV      Full  Yes      Yes                          +---------+---------------+---------+-----------+----------+-------+ SFJ      Full           Yes      Yes                          +---------+---------------+---------+-----------+----------+-------+ FV Prox  Full           Yes      Yes                          +---------+---------------+---------+-----------+----------+-------+ FV Mid   Full           Yes      Yes                          +---------+---------------+---------+-----------+----------+-------+ FV DistalFull           Yes      Yes                          +---------+---------------+---------+-----------+----------+-------+ PFV      Full                                                 +---------+---------------+---------+-----------+----------+-------+ POP      Full           Yes      Yes                          +---------+---------------+---------+-----------+----------+-------+ PTV       Full           Yes      Yes                          +---------+---------------+---------+-----------+----------+-------+ PERO     Full           Yes      Yes                          +---------+---------------+---------+-----------+----------+-------+ Gastroc  Full                                                 +---------+---------------+---------+-----------+----------+-------+ GSV      Full           Yes      Yes                          +---------+---------------+---------+-----------+----------+-------+ Superficial edema seen around left ankle.  Findings reported to Mateo Flow, RN at Dr. Eleanora Neighbor office at 09:35 am.  Summary: Right: No evidence of common femoral vein obstruction. Left: No evidence of deep vein thrombosis in the lower extremity. No indirect evidence of obstruction proximal to the inguinal ligament. No cystic structure found in the popliteal fossa.  *See table(s) above for measurements and observations. Electronically signed by  Larae Grooms MD on 07/24/2018 at 9:55:59 PM.    Final     EKG None  Radiology No results found.  Procedures Procedures (including critical care time)  Medications Ordered in ED Medications  sodium chloride flush (NS) 0.9 % injection 3 mL (has no administration in time range)  ketorolac (TORADOL) injection 30 mg (has no administration in time range)    Well appearing, no abdominal pain at this time.  She has had this bleeding before.  With nurse present patient was instructed to follow up with GYN for evaluation of the vaginal bleeding.  Patient verbalizes understanding and agrees to follow up.  Given her controlled medication caution and getting narcotics from multiple sources I will not be prescribing narcotics for 0/10 pain.   Final Clinical Impressions(s) / ED Diagnoses    Return for intractable cough, coughing up blood,fevers >100.4 unrelieved by medication, shortness of breath, intractable vomiting, chest pain,  shortness of breath, weakness,numbness, changes in speech, facial asymmetry,abdominal pain, passing out,Inability to tolerate liquids or food, cough, altered mental status or any concerns. No signs of systemic illness or infection. The patient is nontoxic-appearing on exam and vital signs are within normal limits.   I have reviewed the triage vital signs and the nursing notes. Pertinent labs &imaging results that were available during my care of the patient were reviewed by me and considered in my medical decision making (see chart for details).  After history, exam, and medical workup I feel the patient has been appropriately medically screened and is safe for discharge home. Pertinent diagnoses were discussed with the patient. Patient was given return precautions   Anjolina Byrer, MD 08/01/18 [id number]

## 2018-08-03 LAB — BASIC METABOLIC PANEL
BUN/Creatinine Ratio: 28 (ref 12–28)
BUN: 35 mg/dL — ABNORMAL HIGH (ref 8–27)
CO2: 16 mmol/L — ABNORMAL LOW (ref 20–29)
Calcium: 8.6 mg/dL — ABNORMAL LOW (ref 8.7–10.3)
Chloride: 106 mmol/L (ref 96–106)
Creatinine, Ser: 1.26 mg/dL — ABNORMAL HIGH (ref 0.57–1.00)
GFR calc Af Amer: 51 mL/min/{1.73_m2} — ABNORMAL LOW (ref >59)
GFR calc non Af Amer: 45 mL/min/{1.73_m2} — ABNORMAL LOW (ref >59)
Glucose: 305 mg/dL — ABNORMAL HIGH (ref 65–99)
Potassium: 4.5 mmol/L (ref 3.5–5.2)
Sodium: 136 mmol/L (ref 134–144)

## 2018-08-03 LAB — CBC
Hematocrit: 32.4 % — ABNORMAL LOW (ref 34.0–46.6)
Hemoglobin: 10.1 g/dL — ABNORMAL LOW (ref 11.1–15.9)
MCH: 25.8 pg — ABNORMAL LOW (ref 26.6–33.0)
MCHC: 31.2 g/dL — ABNORMAL LOW (ref 31.5–35.7)
MCV: 83 fL (ref 79–97)
Platelets: 227 10*3/uL (ref 150–450)
RBC: 3.91 x10E6/uL (ref 3.77–5.28)
RDW: 14.7 % (ref 11.7–15.4)
WBC: 3.7 10*3/uL (ref 3.4–10.8)

## 2018-08-03 LAB — HIV ANTIBODY (ROUTINE TESTING W REFLEX): HIV Screen 4th Generation wRfx: NONREACTIVE

## 2018-08-04 ENCOUNTER — Ambulatory Visit: Payer: Medicare Other | Admitting: Podiatry

## 2018-08-04 ENCOUNTER — Other Ambulatory Visit: Payer: Self-pay

## 2018-08-04 ENCOUNTER — Ambulatory Visit (INDEPENDENT_AMBULATORY_CARE_PROVIDER_SITE_OTHER): Payer: Medicare Other | Admitting: Podiatry

## 2018-08-04 VITALS — Temp 97.7°F

## 2018-08-04 DIAGNOSIS — R609 Edema, unspecified: Secondary | ICD-10-CM | POA: Diagnosis not present

## 2018-08-04 DIAGNOSIS — Q828 Other specified congenital malformations of skin: Secondary | ICD-10-CM | POA: Diagnosis not present

## 2018-08-04 DIAGNOSIS — E1142 Type 2 diabetes mellitus with diabetic polyneuropathy: Secondary | ICD-10-CM

## 2018-08-04 MED ORDER — PREGABALIN 75 MG PO CAPS: 75.0000 mg | ORAL_CAPSULE | Freq: Every day | ORAL | 0 refills | Status: AC

## 2018-08-04 NOTE — Patient Instructions (Signed)
Pregabalin capsules What is this medicine? PREGABALIN (pre GAB a lin) is used to treat nerve pain from diabetes, shingles, spinal cord injury, and fibromyalgia. It is also used to control seizures in epilepsy. This medicine may be used for other purposes; ask your health care provider or pharmacist if you have questions. COMMON BRAND NAME(S): Lyrica What should I tell my health care provider before I take this medicine? They need to know if you have any of these conditions: -bleeding problems -heart disease, including heart failure -history of alcohol or drug abuse -kidney disease -suicidal thoughts, plans, or attempt; a previous suicide attempt by you or a family member -an unusual or allergic reaction to pregabalin, gabapentin, other medicines, foods, dyes, or preservatives -pregnant or trying to get pregnant or trying to conceive with your partner -breast-feeding How should I use this medicine? Take this medicine by mouth with a glass of water. Follow the directions on the prescription label. You can take it with or without food. If it upsets your stomach, take it with food. Take your medicine at regular intervals. Do not take it more often than directed. Do not stop taking except on your doctor's advice. A special MedGuide will be given to you by the pharmacist with each prescription and refill. Be sure to read this information carefully each time. Talk to your pediatrician regarding the use of this medicine in children. While this drug may be prescribed for children as young as 1 month for selected conditions, precautions do apply. Overdosage: If you think you have taken too much of this medicine contact a poison control center or emergency room at once. NOTE: This medicine is only for you. Do not share this medicine with others. What if I miss a dose? If you miss a dose, take it as soon as you can. If it is almost time for your next dose, take only that dose. Do not take double or extra  doses. What may interact with this medicine? -alcohol -certain medicines for blood pressure like captopril, enalapril, or lisinopril -certain medicines for diabetes, like pioglitazone or rosiglitazone -certain medicines for anxiety or sleep -narcotic medicines for pain This list may not describe all possible interactions. Give your health care provider a list of all the medicines, herbs, non-prescription drugs, or dietary supplements you use. Also tell them if you smoke, drink alcohol, or use illegal drugs. Some items may interact with your medicine. What should I watch for while using this medicine? Tell your doctor or healthcare professional if your symptoms do not start to get better or if they get worse. Visit your doctor or health care professional for regular checks on your progress. Do not stop taking except on your doctor's advice. You may develop a severe reaction. Your doctor will tell you how much medicine to take. Wear a medical identification bracelet or chain if you are taking this medicine for seizures, and carry a card that describes your disease and details of your medicine and dosage times. You may get drowsy or dizzy. Do not drive, use machinery, or do anything that needs mental alertness until you know how this medicine affects you. Do not stand or sit up quickly, especially if you are an older patient. This reduces the risk of dizzy or fainting spells. Alcohol may interfere with the effect of this medicine. Avoid alcoholic drinks. If you have a heart condition, like congestive heart failure, and notice that you are retaining water and have swelling in your hands or feet, contact your   health care provider immediately. The use of this medicine may increase the chance of suicidal thoughts or actions. Pay special attention to how you are responding while on this medicine. Any worsening of mood, or thoughts of suicide or dying should be reported to your health care professional right  away. This medicine has caused reduced sperm counts in some men. This may interfere with the ability to father a child. You should talk to your doctor or health care professional if you are concerned about your fertility. Women who become pregnant while using this medicine for seizures may enroll in the [place] Antiepileptic Drug Pregnancy Registry by calling 1-888-233-2334. This registry collects information about the safety of antiepileptic drug use during pregnancy. What side effects may I notice from receiving this medicine? Side effects that you should report to your doctor or health care professional as soon as possible: -allergic reactions like skin rash, itching or hives, swelling of the face, lips, or tongue -breathing problems -changes in vision -chest pain -confusion -jerking or unusual movements of any part of your body -loss of memory -muscle pain, tenderness, or weakness -suicidal thoughts or other mood changes -swelling of the ankles, feet, hands -unusual bruising or bleeding Side effects that usually do not require medical attention (report to your doctor or health care professional if they continue or are bothersome): -dizziness -drowsiness -dry mouth -headache -nausea -tremors -trouble sleeping -weight gain This list may not describe all possible side effects. Call your doctor for medical advice about side effects. You may report side effects to FDA at 1-800-FDA-1088. Where should I keep my medicine? Keep out of the reach of children. This medicine can be abused. Keep your medicine in a safe place to protect it from theft. Do not share this medicine with anyone. Selling or giving away this medicine is dangerous and against the law. This medicine may cause accidental overdose and death if it taken by other adults, children, or pets. Mix any unused medicine with a substance like cat litter or coffee grounds. Then throw the medicine away in a sealed container like a  sealed bag or a coffee can with a lid. Do not use the medicine after the expiration date. Store at room temperature between 15 and 30 degrees C (59 and 86 degrees F). NOTE: This sheet is a summary. It may not cover all possible information. If you have questions about this medicine, talk to your doctor, pharmacist, or health care provider.  2019 Elsevier/Gold Standard (2017-07-21 10:23:36)  

## 2018-08-05 ENCOUNTER — Other Ambulatory Visit (HOSPITAL_COMMUNITY)
Admission: RE | Admit: 2018-08-05 | Discharge: 2018-08-05 | Disposition: A | Discharge status: Home / Self Care | Payer: Medicare Other | Source: Ambulatory Visit | Attending: Cardiology | Admitting: Cardiology

## 2018-08-05 DIAGNOSIS — Z1159 Encounter for screening for other viral diseases: Secondary | ICD-10-CM | POA: Insufficient documentation

## 2018-08-05 DIAGNOSIS — Z01812 Encounter for preprocedural laboratory examination: Secondary | ICD-10-CM | POA: Insufficient documentation

## 2018-08-06 LAB — NOVEL CORONAVIRUS, NAA (HOSP ORDER, SEND-OUT TO REF LAB; TAT 18-24 HRS): SARS-CoV-2, NAA: NOT DETECTED

## 2018-08-08 DIAGNOSIS — I998 Other disorder of circulatory system: Secondary | ICD-10-CM

## 2018-08-08 DIAGNOSIS — I70229 Atherosclerosis of native arteries of extremities with rest pain, unspecified extremity: Secondary | ICD-10-CM

## 2018-08-09 ENCOUNTER — Encounter (HOSPITAL_COMMUNITY)
Admission: RE | Disposition: A | Discharge status: Home / Self Care | Payer: Self-pay | Source: Home / Self Care | Attending: Cardiology

## 2018-08-09 ENCOUNTER — Other Ambulatory Visit: Payer: Self-pay

## 2018-08-09 ENCOUNTER — Ambulatory Visit (HOSPITAL_COMMUNITY)
Admission: RE | Admit: 2018-08-09 | Discharge: 2018-08-09 | Disposition: A | Discharge status: Home / Self Care | Payer: Medicare Other | Source: Home / Self Care | Attending: Nephrology | Admitting: Nephrology

## 2018-08-09 ENCOUNTER — Ambulatory Visit (HOSPITAL_COMMUNITY)
Admission: RE | Admit: 2018-08-09 | Discharge: 2018-08-09 | Disposition: A | Discharge status: Home / Self Care | Payer: Medicare Other | Attending: Cardiology | Admitting: Cardiology

## 2018-08-09 DIAGNOSIS — Z794 Long term (current) use of insulin: Secondary | ICD-10-CM | POA: Diagnosis not present

## 2018-08-09 DIAGNOSIS — I70238 Atherosclerosis of native arteries of right leg with ulceration of other part of lower right leg: Secondary | ICD-10-CM

## 2018-08-09 DIAGNOSIS — Z87891 Personal history of nicotine dependence: Secondary | ICD-10-CM | POA: Insufficient documentation

## 2018-08-09 DIAGNOSIS — I70229 Atherosclerosis of native arteries of extremities with rest pain, unspecified extremity: Secondary | ICD-10-CM

## 2018-08-09 DIAGNOSIS — N189 Chronic kidney disease, unspecified: Secondary | ICD-10-CM | POA: Insufficient documentation

## 2018-08-09 DIAGNOSIS — E1142 Type 2 diabetes mellitus with diabetic polyneuropathy: Secondary | ICD-10-CM | POA: Insufficient documentation

## 2018-08-09 DIAGNOSIS — K7460 Unspecified cirrhosis of liver: Secondary | ICD-10-CM | POA: Insufficient documentation

## 2018-08-09 DIAGNOSIS — D638 Anemia in other chronic diseases classified elsewhere: Secondary | ICD-10-CM

## 2018-08-09 DIAGNOSIS — Z888 Allergy status to other drugs, medicaments and biological substances status: Secondary | ICD-10-CM | POA: Insufficient documentation

## 2018-08-09 DIAGNOSIS — I70213 Atherosclerosis of native arteries of extremities with intermittent claudication, bilateral legs: Secondary | ICD-10-CM | POA: Diagnosis present

## 2018-08-09 DIAGNOSIS — J449 Chronic obstructive pulmonary disease, unspecified: Secondary | ICD-10-CM | POA: Insufficient documentation

## 2018-08-09 DIAGNOSIS — Z79899 Other long term (current) drug therapy: Secondary | ICD-10-CM | POA: Insufficient documentation

## 2018-08-09 DIAGNOSIS — I129 Hypertensive chronic kidney disease with stage 1 through stage 4 chronic kidney disease, or unspecified chronic kidney disease: Secondary | ICD-10-CM | POA: Insufficient documentation

## 2018-08-09 DIAGNOSIS — E785 Hyperlipidemia, unspecified: Secondary | ICD-10-CM | POA: Insufficient documentation

## 2018-08-09 DIAGNOSIS — E11319 Type 2 diabetes mellitus with unspecified diabetic retinopathy without macular edema: Secondary | ICD-10-CM | POA: Diagnosis not present

## 2018-08-09 DIAGNOSIS — Z7982 Long term (current) use of aspirin: Secondary | ICD-10-CM | POA: Diagnosis not present

## 2018-08-09 DIAGNOSIS — E1151 Type 2 diabetes mellitus with diabetic peripheral angiopathy without gangrene: Secondary | ICD-10-CM | POA: Diagnosis not present

## 2018-08-09 DIAGNOSIS — I998 Other disorder of circulatory system: Secondary | ICD-10-CM

## 2018-08-09 DIAGNOSIS — E1122 Type 2 diabetes mellitus with diabetic chronic kidney disease: Secondary | ICD-10-CM | POA: Diagnosis not present

## 2018-08-09 DIAGNOSIS — I739 Peripheral vascular disease, unspecified: Secondary | ICD-10-CM

## 2018-08-09 HISTORY — PX: LOWER EXTREMITY ANGIOGRAPHY: CATH118251

## 2018-08-09 LAB — IRON AND TIBC
Iron: 24 ug/dL — ABNORMAL LOW (ref 28–170)
Saturation Ratios: 9 % — ABNORMAL LOW (ref 10.4–31.8)
TIBC: 262 ug/dL (ref 250–450)
UIBC: 238 ug/dL

## 2018-08-09 LAB — FERRITIN: Ferritin: 55 ng/mL (ref 11–307)

## 2018-08-09 LAB — GLUCOSE, CAPILLARY: Glucose-Capillary: 156 mg/dL — ABNORMAL HIGH (ref 70–99)

## 2018-08-09 LAB — POCT HEMOGLOBIN-HEMACUE: Hemoglobin: 10.5 g/dL — ABNORMAL LOW (ref 12.0–15.0)

## 2018-08-09 SURGERY — LOWER EXTREMITY ANGIOGRAPHY
Anesthesia: LOCAL

## 2018-08-09 MED ORDER — SODIUM CHLORIDE 0.9% FLUSH: 3.0000 mL | Freq: Two times a day (BID) | INTRAVENOUS | Status: DC

## 2018-08-09 MED ORDER — SODIUM CHLORIDE 0.9 % IV SOLN: 250.0000 mL | INTRAVENOUS | Status: DC | PRN

## 2018-08-09 MED ORDER — HEPARIN (PORCINE) IN NACL 1000-0.9 UT/500ML-% IV SOLN
INTRAVENOUS | Status: DC | PRN
  Administered 2018-08-09 (×2): 500 mL

## 2018-08-09 MED ORDER — IODIXANOL 320 MG/ML IV SOLN
INTRAVENOUS | Status: DC | PRN
  Administered 2018-08-09: 75 mL via INTRA_ARTERIAL

## 2018-08-09 MED ORDER — SODIUM CHLORIDE 0.9 % IV SOLN: INTRAVENOUS | Status: DC

## 2018-08-09 MED ORDER — SODIUM CHLORIDE 0.9% FLUSH: 3.0000 mL | INTRAVENOUS | Status: DC | PRN

## 2018-08-09 MED ORDER — SODIUM CHLORIDE 0.9 % IV BOLUS
500.0000 mL | Freq: Once | INTRAVENOUS | Status: AC
  Administered 2018-08-09: 06:00:00 500 mL via INTRAVENOUS

## 2018-08-09 MED ORDER — HEPARIN (PORCINE) IN NACL 1000-0.9 UT/500ML-% IV SOLN
INTRAVENOUS | Status: AC
  Filled 2018-08-09: qty 1000

## 2018-08-09 MED ORDER — ONDANSETRON HCL 4 MG/2ML IJ SOLN: 4.0000 mg | Freq: Four times a day (QID) | INTRAMUSCULAR | Status: DC | PRN

## 2018-08-09 MED ORDER — HYDRALAZINE HCL 20 MG/ML IJ SOLN: 5.0000 mg | INTRAMUSCULAR | Status: DC | PRN

## 2018-08-09 MED ORDER — MIDAZOLAM HCL 2 MG/2ML IJ SOLN
INTRAMUSCULAR | Status: AC
  Filled 2018-08-09: qty 2

## 2018-08-09 MED ORDER — ACETAMINOPHEN 325 MG PO TABS: 650.0000 mg | ORAL_TABLET | ORAL | Status: DC | PRN

## 2018-08-09 MED ORDER — EPOETIN ALFA 10000 UNIT/ML IJ SOLN: 10000.0000 [IU] | INTRAMUSCULAR | Status: DC

## 2018-08-09 MED ORDER — LIDOCAINE HCL (PF) 1 % IJ SOLN
INTRAMUSCULAR | Status: DC | PRN
  Administered 2018-08-09: 20 mL

## 2018-08-09 MED ORDER — LIDOCAINE HCL (PF) 1 % IJ SOLN
INTRAMUSCULAR | Status: AC
  Filled 2018-08-09: qty 30

## 2018-08-09 MED ORDER — FENTANYL CITRATE (PF) 100 MCG/2ML IJ SOLN
INTRAMUSCULAR | Status: DC | PRN
  Administered 2018-08-09: 50 ug via INTRAVENOUS

## 2018-08-09 MED ORDER — FENTANYL CITRATE (PF) 100 MCG/2ML IJ SOLN
INTRAMUSCULAR | Status: AC
  Filled 2018-08-09: qty 2

## 2018-08-09 MED ORDER — MIDAZOLAM HCL 2 MG/2ML IJ SOLN
INTRAMUSCULAR | Status: DC | PRN
  Administered 2018-08-09: 1 mg via INTRAVENOUS

## 2018-08-09 MED ORDER — EPOETIN ALFA 10000 UNIT/ML IJ SOLN
INTRAMUSCULAR | Status: AC
  Administered 2018-08-09: 10000 [IU]
  Filled 2018-08-09: qty 1

## 2018-08-09 MED ORDER — SODIUM CHLORIDE 0.9 % WEIGHT BASED INFUSION: 1.0000 mL/kg/h | INTRAVENOUS | Status: DC

## 2018-08-09 SURGICAL SUPPLY — 12 items
CATH OMNI FLUSH 5F 65CM (CATHETERS) ×1 IMPLANT
CLOSURE MYNX CONTROL 5F (Vascular Products) ×1 IMPLANT
KIT MICROPUNCTURE NIT STIFF (SHEATH) ×1 IMPLANT
KIT PV (KITS) ×2 IMPLANT
SHEATH PINNACLE 5F 10CM (SHEATH) ×1 IMPLANT
SHEATH PROBE COVER 6X72 (BAG) ×1 IMPLANT
STOPCOCK MORSE 400PSI 3WAY (MISCELLANEOUS) ×1 IMPLANT
SYR MEDRAD MARK 7 150ML (SYRINGE) ×2 IMPLANT
TRANSDUCER W/STOPCOCK (MISCELLANEOUS) ×2 IMPLANT
TRAY PV CATH (CUSTOM PROCEDURE TRAY) ×2 IMPLANT
TUBING CIL FLEX 10 FLL-RA (TUBING) ×1 IMPLANT
WIRE BENTSON .035X145CM (WIRE) ×1 IMPLANT

## 2018-08-09 NOTE — H&P (Addendum)
[name] is an 68 y.o. female.   Chief Complaint: Claudication  HPI:   68 y.o. African American female  with hypertension, hyperlipidemia, type 2 DM, CKD, COPD, PAD seen on b/l LE duplex US. Patient previously had a right midfoot nonhealing ulcer over a callus when she was seen on 05/22, which is now better healing. LE angiogram was postponed twice in the past. However, patient continues to have bilateral severe claudication. Moderately reduced waveform b/l ankles with likely falsely elevated ABI in calcified noncompressible arteries.   Past Medical History:  Diagnosis Date  . ANEMIA, CHRONIC DISEASE NEC 03/15/2006  . Carotid artery occlusion   . Cirrhosis ([place])   . Colon polyps   . Constipation due to opioid therapy 06/05/2014  . DEGENERATIVE JOINT DISEASE 05/06/2007   On going for >5 yrs No imaging to confirm Has tried ultram, mobic, neurontin which did not work Ambulance person tried once worked well    . Diabetes mellitus    Type II  . DIABETIC  RETINOPATHY 03/15/2006  . 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 Diastolic dysfunction 95/18/8416   Grade 1 by Echocardiogram 12/13/14  . Elevated liver enzymes   . Heart murmur   . Hemorrhoids   . History of kidney stones   . Hyperlipidemia 04/01/2011  . Hypertension   . Mild AI (aortic insufficiency)   . Moderate aortic regurgitation 09/25/2008   12/13/14 Echocardiogram    . Myalgia   . PAD (peripheral artery disease) ([place])   . PANCREATITIS, CHRONIC 03/29/2006  . Peripheral arterial disease ([place])   . Pneumonia    age 5-20  . RENAL INSUFFICIENCY, CHRONIC 03/15/2006  . Tonsillar mass    Left    Past Surgical History:  Procedure Laterality Date  . BREAST SURGERY    . COLONOSCOPY    . DILATION AND CURETTAGE OF UTERUS N/A 06/16/2016   Procedure: DILATATION AND CURETTAGE;  Surgeon: Mora Bellman, MD;  Location: [facility];  Service: Gynecology;  Laterality: N/A;  . HYSTEROSCOPY N/A 06/16/2016   Procedure: HYSTEROSCOPY;  Surgeon: Mora Bellman, MD;  Location: [facility];  Service: Gynecology;  Laterality: N/A;  . MOUTH SURGERY N/A   . POLYPECTOMY N/A 06/16/2016   Procedure: POLYPECTOMY;  Surgeon: Mora Bellman, MD;  Location: [facility];  Service: Gynecology;  Laterality: N/A;    Family History  Problem Relation Age of Onset  . Dementia Mother   . Colon cancer Neg Hx    Social History:  reports that she quit smoking about 16 months ago. Her smoking use included cigarettes. She has a 13.20 pack-year smoking history. She has never used smokeless tobacco. She reports previous alcohol use. She reports that she does not use drugs.  Allergies:  Allergies  Allergen Reactions  . Atorvastatin Other (See Comments)    Caused muscles in her back to be very sore  . Gabapentin Swelling    Legs and feet swell  . Nicorette [Nicotine] Nausea Only    Review of Systems  Constitution: Negative for decreased appetite, malaise/fatigue, weight gain and weight loss.  HENT: Negative for congestion.   Eyes: Negative for visual disturbance.  Cardiovascular: Negative for chest pain, dyspnea on exertion, leg swelling, palpitations and syncope.       B/l claudication  Respiratory: Negative for cough.   Endocrine: Negative for cold intolerance.  Hematologic/Lymphatic: Does not bruise/bleed easily.  Skin: Negative for itching and rash.  Musculoskeletal: Negative for myalgias.  Gastrointestinal: Negative for abdominal pain, nausea and vomiting.  Genitourinary: Negative  for dysuria.  Neurological: Negative for dizziness and weakness.  Psychiatric/Behavioral: The patient is not nervous/anxious.   All other systems reviewed and are negative.    Blood pressure (!) 126/58, pulse 70, temperature 98.1 F (36.7 C), temperature source Oral, resp. rate 20, last menstrual period 02/22/2016, SpO2 100 %. There is no height or weight on file to calculate BMI.  Physical Exam  Constitutional: She is oriented to person, place, and time. She appears  well-developed and well-nourished. No distress.  HENT:  Head: Normocephalic and atraumatic.  Eyes: Pupils are equal, round, and reactive to light. Conjunctivae are normal.  Neck: No JVD present.  Cardiovascular: Normal rate and regular rhythm.  Pulses:      Dorsalis pedis pulses are 1+ on the right side and 1+ on the left side.       Posterior tibial pulses are 0 on the right side and 0 on the left side.  Rt foot ulcer, now better healing  Pulmonary/Chest: Effort normal and breath sounds normal. She has no wheezes. She has no rales.  Abdominal: Soft. Bowel sounds are normal. There is no rebound.  Musculoskeletal:        General: No edema.  Lymphadenopathy:    She has no cervical adenopathy.  Neurological: She is alert and oriented to person, place, and time. No cranial nerve deficit.  Skin: Skin is warm and dry.  Psychiatric: She has a normal mood and affect.  Nursing note and vitals reviewed.   Results for orders placed or performed during the hospital encounter of 08/09/18 (from the past 48 hour(s))  Glucose, capillary     Status: Abnormal   Collection Time: 08/09/18  7:05 AM  Result Value Ref Range   Glucose-Capillary 156 (H) 70 - 99 mg/dL   Comment 1 Notify RN    Comment 2 Document in Chart     Labs:   Lab Results  Component Value Date   WBC 3.7 08/02/2018   HGB 10.1 (L) 08/02/2018   HCT 32.4 (L) 08/02/2018   MCV 83 08/02/2018   PLT 227 08/02/2018    Recent Labs  Lab 08/02/18 1347  NA 136  K 4.5  CL 106  CO2 16*  BUN 35*  CREATININE 1.26*  CALCIUM 8.6*  GLUCOSE 305*    Lipid Panel     Component Value Date/Time   CHOL 137 08/15/2015 1343   TRIG 78 08/15/2015 1343   HDL 99 08/15/2015 1343   CHOLHDL 1.4 08/15/2015 1343   CHOLHDL 2.5 12/11/2013 1008   VLDL 26 12/11/2013 1008   LDLCALC 22 08/15/2015 1343    BNP (last 3 results) Recent Labs    03/13/18 1608  BNP 121.1*    HEMOGLOBIN A1C Lab Results  Component Value Date   HGBA1C 8.7 (H)  06/11/2017   MPG 202.99 06/11/2017    Cardiac Panel (last 3 results) Recent Labs    03/13/18 1608  TROPONINI <0.03    Lab Results  Component Value Date   CKTOTAL 162 04/15/2017   CKMB 3.4 04/15/2017   TROPONINI <0.03 03/13/2018     TSH No results for input(s): TSH in the last 8760 hours.   Medications Prior to Admission  Medication Sig Dispense Refill  . amLODipine (NORVASC) 10 MG tablet Take 10 mg by mouth daily.    Marland Kitchen aspirin EC 81 MG tablet Take 1 tablet (81 mg total) by mouth daily.    Marland Kitchen LINZESS 145 MCG CAPS capsule Take 145 mcg by mouth daily.     Marland Kitchen  meclizine (ANTIVERT) 25 MG tablet Take 25 mg by mouth every 8 (eight) hours as needed for dizziness or nausea.     Marland Kitchen omeprazole (PRILOSEC) 40 MG capsule Take 40 mg by mouth daily.    . Pancrelipase, Lip-Prot-Amyl, 24000-76000 units CPEP Take 4 capsules (96,000 Units total) by mouth See admin instructions. Take 4 capsules by mouth 2-3 times daily before meals (Patient taking differently: Take 2-4 capsules by mouth See admin instructions. Take 4 capsules by mouth two to three times a day with meals and 2 capsules with any snacks) 1170 capsule 3  . pregabalin (LYRICA) 75 MG capsule Take 1 capsule (75 mg total) by mouth daily. 60 capsule 0  . PROAIR HFA 108 (90 Base) MCG/ACT inhaler Inhale 2 puffs into the lungs 4 (four) times daily as needed for wheezing or shortness of breath.  3  . rosuvastatin (CRESTOR) 20 MG tablet TAKE 1 TABLET BY MOUTH EVERY DAY (Patient taking differently: Take 20 mg by mouth daily. ) 90 tablet 3  . TRESIBA FLEXTOUCH 100 UNIT/ML SOPN FlexTouch Pen Inject 10 Units into the skin every morning.     Marland Kitchen ACCU-CHEK AVIVA PLUS test strip TEST three times a day 100 each 11  . Blood Glucose Monitoring Suppl (ACCU-CHEK AVIVA PLUS) w/Device KIT Use to check you blood sugar 3 times a day 1 kit 1  . sucralfate (CARAFATE) 1 g tablet Take 1 tablet (1 g total) by mouth 3 (three) times daily after meals. (Patient taking  differently: Take 1 g by mouth daily as needed (stomach irritation). ) 60 tablet 0  . traMADol (ULTRAM) 50 MG tablet Take 1 tablet (50 mg total) by mouth every 6 (six) hours as needed for severe pain. (Patient not taking: Reported on 08/05/2018) 8 tablet 0      Current Facility-Administered Medications:  .  0.9 %  sodium chloride infusion, , Intravenous, Continuous, Adrian Prows, MD .  0.9 %  sodium chloride infusion, 250 mL, Intravenous, PRN, Adrian Prows, MD .  sodium chloride flush (NS) 0.9 % injection 3 mL, 3 mL, Intravenous, Q12H, Adrian Prows, MD .  sodium chloride flush (NS) 0.9 % injection 3 mL, 3 mL, Intravenous, PRN, Adrian Prows, MD   Today's Vitals   08/09/18 0600  BP: (!) 126/58  Pulse: 70  Resp: 20  Temp: 98.1 F (36.7 C)  TempSrc: Oral  SpO2: 100%   There is no height or weight on file to calculate BMI.    Lower Extremity Arterial Duplex 04/21/2018 : No hemodynamically significant stenoses are identified in the lower extremity arterial system.  There is diffuse calcific plaque in bilateral lower extremity arteries. Left below knee vessels show more pronounce plaque with severely abnormal waveform (monophasic) at ankle. Moderately abnormal waveform in right ankle.  This exam reveals mildly decreased perfusion of the right lower extremity, noted at the post tibial artery level (ABI 0.93). This exam reveals normal perfusion of the left lower extremity (ABI 0.98).  ABI may be falsely elevated due to medial calcinosis.    Echocardiogram 09/21/2017: Left ventricle cavity is normal in size. Severe concentric hypertrophy of the left ventricle, both septal and posterior wall measuring 1.7 cm in AP dimension. Normal global wall motion. Grade II diastolic dysfunction. Elevated LAP. Calculated EF 59%. Left atrial cavity is mildly dilated. Mild aortic valve leaflet thickening. Moderate (Grade III) aortic regurgitation. Moderate (Grade II) mitral regurgitation. Moderate tricuspid  regurgitation. Estimated pulmonary artery systolic pressure 31 mmHg.  Lexiscan myoview stress test 09/06/2017: 1.  Lexiscan stress test was performed. Exercise capacity was not assessed. No stress symptoms reported. Peak blood pressure was 148/88 mmHg. The resting and stress electrocardiogram demonstrated normal sinus rhythm, normal resting conduction, no resting arrhythmias and normal rest repolarization. Possible old anteroseptal infarct. Stress EKG is non diagnostic for ischemia as it is a pharmacologic stress. 2. The overall quality of the study is excellent. There is no evidence of abnormal lung activity. Stress and rest SPECT images demonstrate homogeneous tracer distribution throughout the myocardium. Gated SPECT imaging reveals normal myocardial thickening and wall motion. The left ventricular ejection fraction was normal (74%). 3. Low risk study.  ABI 09/22/2017: Moderately abnormal waveforms of the right ankle. Severely abnormal waveforms of the left ankle. Mildly decreased right resting ABI at o.96 and moderately decreased left resting ABI at 0.59.  Carotid artery duplex 09/21/2017: Minimal stenosis in the right internal carotid artery (1-15%) with heterogeneous plaque Antegrade right vertebral artery flow. Antegrade left vertebral artery flow.  Assessment/Plan  67 y.o. African American female  with hypertension, hyperlipidemia, type 2 DM, CKD, COPD, PAD seen on b/l LE duplex US with severe claudication  Plan: LE angiogram, possible intervention  Nigel Mormon, MD 08/09/2018, 7:13 AM [facility] Pager: [phone] Office: [phone] If no answer: [phone]

## 2018-08-09 NOTE — Interval H&P Note (Signed)
History and Physical Interval Note:  08/09/2018 7:57 AM  [name]  has presented today for surgery, with the diagnosis of claudication.  The various methods of treatment have been discussed with the patient and family. After consideration of risks, benefits and other options for treatment, the patient has consented to  Procedure(s): LOWER EXTREMITY ANGIOGRAPHY (N/A) as a surgical intervention.  The patient's history has been reviewed, patient examined, no change in status, stable for surgery.  I have reviewed the patient's chart and labs.  Questions were answered to the patient's satisfaction.     [place]

## 2018-08-09 NOTE — Discharge Instructions (Signed)

## 2018-08-10 ENCOUNTER — Encounter (HOSPITAL_COMMUNITY): Payer: Medicare Other

## 2018-08-10 ENCOUNTER — Encounter (HOSPITAL_COMMUNITY): Payer: Self-pay | Admitting: Cardiology

## 2018-08-10 NOTE — Progress Notes (Signed)
Subjective: [name] presents to the office today for concerns of swelling to her feet but more so she is describing numbness and tingling to her feet.  She states that she appears in gabapentin but had a reaction to this.  She has pain at nighttime.  She denies any claudication symptoms.  She also has calluses to both her feet that she would have trimmed today.  Denies any open sores. Denies any systemic complaints such as fevers, chills, nausea, vomiting. No acute changes since last appointment, and no other complaints at this time.   Objective: AAO x3, NAD DP pulses 2/4, decreased PT pulses.  Mild swelling to bilateral feet there is no pitting edema.  No erythema or warmth.  No pain with ankle, subtalar range of motion.  There is decrease sensation with Semmes-Weinstein monofilament. Hyperkeratotic lesion bilateral submetatarsal area.  Upon debridement no ongoing ulceration drainage or signs of infection. No edema, erythema, increase in warmth to bilateral lower extremities.  No open lesions or pre-ulcerative lesions.  No pain with calf compression, swelling, warmth, erythema  Assessment: Type 2 diabetes with neuropathy; porokeratosis  Plan: -All treatment options discussed with the patient including all alternatives, risks, complications.  -Discussed etiology of neuropathy as well as medication options.  She is on gabapentin previously but had difficulty taking this.  We discussed Lyrica.  She is going to start this and we discussed side effects. -Calluses debrided x2 without complications or bleeding. -Patient encouraged to call the office with any questions, concerns, change in symptoms.   [name] DPM

## 2018-08-11 ENCOUNTER — Telehealth: Payer: Self-pay

## 2018-08-11 NOTE — Telephone Encounter (Signed)
Pt called end of day on 6/17 stating that she had a procedure and is now c/o of rash on upper body and itching on lower body. Verbally discussed with AK and she said that it is probably coming from from contrast. Recommended pt take [facility] and to f/u if no improvement. Pt also c/o still having swelling in legs. AK said we would f/u on that at her next ov. Pt aware of instructions.//ah

## 2018-08-19 ENCOUNTER — Other Ambulatory Visit: Payer: Self-pay

## 2018-08-19 ENCOUNTER — Ambulatory Visit (INDEPENDENT_AMBULATORY_CARE_PROVIDER_SITE_OTHER): Payer: Medicare Other | Admitting: Cardiology

## 2018-08-19 ENCOUNTER — Encounter: Payer: Self-pay | Admitting: Cardiology

## 2018-08-19 VITALS — BP 133/59 | HR 75 | Temp 97.7°F | Ht 65.0 in | Wt 133.0 lb

## 2018-08-19 DIAGNOSIS — M25469 Effusion, unspecified knee: Secondary | ICD-10-CM

## 2018-08-19 DIAGNOSIS — I739 Peripheral vascular disease, unspecified: Secondary | ICD-10-CM | POA: Diagnosis not present

## 2018-08-19 DIAGNOSIS — Z87891 Personal history of nicotine dependence: Secondary | ICD-10-CM

## 2018-08-19 DIAGNOSIS — IMO0002 Reserved for concepts with insufficient information to code with codable children: Secondary | ICD-10-CM

## 2018-08-19 DIAGNOSIS — L97419 Non-pressure chronic ulcer of right heel and midfoot with unspecified severity: Secondary | ICD-10-CM

## 2018-08-19 DIAGNOSIS — M25569 Pain in unspecified knee: Secondary | ICD-10-CM

## 2018-08-19 DIAGNOSIS — E1122 Type 2 diabetes mellitus with diabetic chronic kidney disease: Secondary | ICD-10-CM | POA: Diagnosis not present

## 2018-08-19 DIAGNOSIS — R21 Rash and other nonspecific skin eruption: Secondary | ICD-10-CM

## 2018-08-19 DIAGNOSIS — E1165 Type 2 diabetes mellitus with hyperglycemia: Secondary | ICD-10-CM

## 2018-08-19 MED ORDER — XARELTO 2.5 MG PO TABS: 2.5000 mg | ORAL_TABLET | Freq: Two times a day (BID) | ORAL | 1 refills | Status: DC

## 2018-08-19 NOTE — Progress Notes (Signed)
Primary Physician:  Nolene Ebbs, MD   Patient ID: [name], female    DOB: [birth year], 68 y.o.   MRN: [id number]  Subjective:    Chief Complaint  Patient presents with  . PAD    hospital f/u, leg swelling  . Edema    HPI: [name]  is a 68 y.o. female  with type 2 diabetes, anemia likely from CKD with regular Procrit injections , chronic pancreatitis, CKD stage III, COPD, carotid stenosis diagnosed in October 2018, hypertension, PAD with diffuse disease by PV angiogram on 08/09/18, and hyperlipidemia.  She was initially evaluated by [place] for preoperative risk stratification for right foot diabetic foot ulceration debridement with Dr. March Rummage, in which was performed on 03/24/2018. She was also scheduled for laryngoscopy for tonsillar nodule with Dr. Reginia Naas; however, patient did not show for her procedure. Lexiscan nuclear stress test in July 2019 was considered low risk study. Echo performed also at that time showed normal LVEF with moderate AR.   She has history of chronic pancreatitis and has had several admissions over the last few months with most recent admission in April 2020 for acute on chronic pancreatitis. She is on Creon and PPI. Has not had any abdominal pain or back pain since discharge. LFT's were elevated during hospitalization; however, trended down at discharge. She continues to be on statin therapy. She continues to have non-healing ulceration on her right foot requiring frequent debridement. States that Dr. March Rummage is wanting to do surgery to remove "bone in her foot" to help with her callous, but would like for diabetes to be better controlled.   Due to abnormal LE arterial duplex and right foot ulceration, patient underwent PV angiogram on 08/09/2018 that showed severe small vessel disease, medical management was recommended. She now presents for follow up. After the procedure, she called our office compalining of diffuse rash with itching and was advised to  use benadryl that has improved this some. She continues to see the rash that is slowly resolving and itching has resolved.   She is doing well post-procedure without any complications from right groin site. She has had improvement in lower extremity edema; however, continues to have swelling in bilateral knees with limited motion/stiffness in the right knee. She has been told to have arthritis in the past. No fever.  She does report some occasional vaginal bleeding and is trying to get an appt with her GYN.  Past Medical History:  Diagnosis Date  . ANEMIA, CHRONIC DISEASE NEC 03/15/2006  . Carotid artery occlusion   . Cirrhosis ([place])   . Colon polyps   . Constipation due to opioid therapy 06/05/2014  . DEGENERATIVE JOINT DISEASE 05/06/2007   On going for >5 yrs No imaging to confirm Has tried ultram, mobic, neurontin which did not work Ambulance person tried once worked well    . Diabetes mellitus    Type II  . DIABETIC  RETINOPATHY 03/15/2006  . 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 Diastolic dysfunction 70/02/7492   Grade 1 by Echocardiogram 12/13/14  . Elevated liver enzymes   . Heart murmur   . Hemorrhoids   . History of kidney stones   . Hyperlipidemia 04/01/2011  . Hypertension   . Mild AI (aortic insufficiency)   . Moderate aortic regurgitation 09/25/2008   12/13/14 Echocardiogram    . Myalgia   . PAD (peripheral artery disease) ([place])   . PANCREATITIS, CHRONIC 03/29/2006  . Peripheral arterial disease ([place])   . Pneumonia  age 45-20  . RENAL INSUFFICIENCY, CHRONIC 03/15/2006  . Tonsillar mass    Left    Past Surgical History:  Procedure Laterality Date  . BREAST SURGERY    . COLONOSCOPY    . DILATION AND CURETTAGE OF UTERUS N/A 06/16/2016   Procedure: DILATATION AND CURETTAGE;  Surgeon: Mora Bellman, MD;  Location: [facility];  Service: Gynecology;  Laterality: N/A;  . HYSTEROSCOPY N/A 06/16/2016   Procedure: HYSTEROSCOPY;  Surgeon: Mora Bellman, MD;  Location: [facility];   Service: Gynecology;  Laterality: N/A;  . LOWER EXTREMITY ANGIOGRAPHY N/A 08/09/2018   Procedure: LOWER EXTREMITY ANGIOGRAPHY;  Surgeon: Adrian Prows, MD;  Location: [facility];  Service: Cardiovascular;  Laterality: N/A;  . MOUTH SURGERY N/A   . POLYPECTOMY N/A 06/16/2016   Procedure: POLYPECTOMY;  Surgeon: Mora Bellman, MD;  Location: [facility];  Service: Gynecology;  Laterality: N/A;    Social History   Socioeconomic History  . Marital status: Single    Spouse name: Not on file  . Number of children: 3  . Years of education: Not on file  . Highest education level: Not on file  Occupational History  . Occupation: retired- disabled  Social Needs  . Financial resource strain: Not on file  . Food insecurity    Worry: Not on file    Inability: Not on file  . Transportation needs    Medical: Not on file    Non-medical: Not on file  Tobacco Use  . Smoking status: Former Smoker    Packs/day: 0.30    Years: 44.00    Pack years: 13.20    Types: Cigarettes    Quit date: 03/2017    Years since quitting: 1.4  . Smokeless tobacco: Never Used  . Tobacco comment: started smoking in Nov 2019 few cigarettes a day  Substance and Sexual Activity  . Alcohol use: Not Currently    Alcohol/week: 0.0 standard drinks  . Drug use: No  . Sexual activity: Not on file  Lifestyle  . Physical activity    Days per week: Not on file    Minutes per session: Not on file  . Stress: Not on file  Relationships  . Social Herbalist on phone: Not on file    Gets together: Not on file    Attends religious service: Not on file    Active member of club or organization: Not on file    Attends meetings of clubs or organizations: Not on file    Relationship status: Not on file  . Intimate partner violence    Fear of current or ex partner: Not on file    Emotionally abused: Not on file    Physically abused: Not on file    Forced sexual activity: Not on file  Other Topics Concern  . Not on  file  Social History Narrative  . Not on file    Review of Systems  Constitution: Negative for decreased appetite, malaise/fatigue, weight gain and weight loss.  Eyes: Negative for visual disturbance.  Cardiovascular: Negative for chest pain, claudication, dyspnea on exertion, leg swelling, orthopnea, palpitations and syncope.  Respiratory: Negative for hemoptysis and wheezing.   Endocrine: Negative for cold intolerance and heat intolerance.  Hematologic/Lymphatic: Does not bruise/bleed easily.  Skin: Positive for poor wound healing. Negative for nail changes.  Musculoskeletal: Positive for joint pain (BILATERAL KNEES; R >L). Negative for back pain, muscle weakness and myalgias.  Gastrointestinal: Negative for abdominal pain, change in bowel habit,  nausea and vomiting.  Genitourinary: Positive for non-menstrual bleeding (minimal and only occasional).  Neurological: Negative for difficulty with concentration, dizziness, focal weakness and headaches.  Psychiatric/Behavioral: Negative for altered mental status and suicidal ideas.  All other systems reviewed and are negative.     Objective:  Blood pressure (!) 133/59, pulse 75, temperature 97.7 F (36.5 C), height 5\' 5"  (1.651 m), weight 133 lb (60.3 kg), last menstrual period 02/22/2016, SpO2 98 %. Body mass index is 22.13 kg/m.    Physical Exam  Constitutional: She is oriented to person, place, and time. Vital signs are normal. She appears well-developed and well-nourished.  HENT:  Head: Normocephalic and atraumatic.  Neck: Normal range of motion. Carotid bruit is present.  Cardiovascular: Normal rate, regular rhythm and intact distal pulses.  Murmur heard.  Harsh midsystolic murmur is present with a grade of 2/6 at the upper right sternal border radiating to the neck. Pulses:      Carotid pulses are on the right side with bruit and on the left side with bruit.      Femoral pulses are 2+ on the right side and 1+ on the left side  with bruit.      Popliteal pulses are 0 on the right side and 0 on the left side.       Dorsalis pedis pulses are 2+ on the right side and 2+ on the left side.       Posterior tibial pulses are 0 on the right side and 0 on the left side.  No leg edema Right foot ulceration bottom mid foot Right lateral foot ulcer. Skin intact   Pulmonary/Chest: Effort normal and breath sounds normal. No accessory muscle usage. No respiratory distress.  Abdominal: Soft. Bowel sounds are normal.  Musculoskeletal: Normal range of motion.     Right knee: She exhibits swelling, effusion and erythema.     Left ankle: She exhibits swelling. Tenderness.  Neurological: She is alert and oriented to person, place, and time.  Skin: Skin is warm and dry.  Right mid foot ulceration covered by dsg. Callus bilateral with thick rigid nails  Vitals reviewed.  Radiology: No results found.  Laboratory examination:    CMP Latest Ref Rng & Units 08/02/2018 07/31/2018 05/28/2018  Glucose 65 - 99 mg/dL 305(H) 182(H) 62(L)  BUN 8 - 27 mg/dL 35(H) 33(H) 15  Creatinine 0.57 - 1.00 mg/dL 1.26(H) 1.60(H) 0.97  Sodium 134 - 144 mmol/L 136 137 138  Potassium 3.5 - 5.2 mmol/L 4.5 3.6 3.9  Chloride 96 - 106 mmol/L 106 108 105  CO2 20 - 29 mmol/L 16(L) 20(L) 24  Calcium 8.7 - 10.3 mg/dL 8.6(L) 8.3(L) 8.6(L)  Total Protein 6.5 - 8.1 g/dL - 6.8 6.7  Total Bilirubin 0.3 - 1.2 mg/dL - 0.5 0.4  Alkaline Phos 38 - 126 U/L - 100 134(H)  AST 15 - 41 U/L - 59(H) 35  ALT 0 - 44 U/L - 55(H) 52(H)   CBC Latest Ref Rng & Units 08/09/2018 08/02/2018 07/31/2018  WBC 3.4 - 10.8 x10E3/uL - 3.7 3.4(L)  Hemoglobin 12.0 - 15.0 g/dL 10.5(L) 10.1(L) 9.4(L)  Hematocrit 34.0 - 46.6 % - 32.4(L) 30.6(L)  Platelets 150 - 450 x10E3/uL - 227 208   Lipid Panel     Component Value Date/Time   CHOL 137 08/15/2015 1343   TRIG 78 08/15/2015 1343   HDL 99 08/15/2015 1343   CHOLHDL 1.4 08/15/2015 1343   CHOLHDL 2.5 12/11/2013 1008   VLDL 26 12/11/2013 1008  LDLCALC 22 08/15/2015 1343   HEMOGLOBIN A1C Lab Results  Component Value Date   HGBA1C 8.7 (H) 06/11/2017   MPG 202.99 06/11/2017   TSH No results for input(s): TSH in the last 8760 hours.  PRN Meds:. There are no discontinued medications. Current Meds  Medication Sig  . amLODipine (NORVASC) 10 MG tablet Take 10 mg by mouth daily.  Marland Kitchen aspirin EC 81 MG tablet Take 1 tablet (81 mg total) by mouth daily.  Marland Kitchen LINZESS 145 MCG CAPS capsule Take 145 mcg by mouth daily.   . meclizine (ANTIVERT) 25 MG tablet Take 25 mg by mouth [name] 8 (eight) hours as needed for dizziness or nausea.   Marland Kitchen omeprazole (PRILOSEC) 40 MG capsule Take 40 mg by mouth daily.  . Pancrelipase, Lip-Prot-Amyl, 24000-76000 units CPEP Take 4 capsules (96,000 Units total) by mouth See admin instructions. Take 4 capsules by mouth 2-3 times daily before meals (Patient taking differently: Take 2-4 capsules by mouth See admin instructions. Take 4 capsules by mouth two to three times a day with meals and 2 capsules with any snacks)  . pregabalin (LYRICA) 75 MG capsule Take 1 capsule (75 mg total) by mouth daily.  Marland Kitchen PROAIR HFA 108 (90 Base) MCG/ACT inhaler Inhale 2 puffs into the lungs 4 (four) times daily as needed for wheezing or shortness of breath.  . rosuvastatin (CRESTOR) 20 MG tablet TAKE 1 TABLET BY MOUTH [name] DAY (Patient taking differently: Take 20 mg by mouth daily. )  . sucralfate (CARAFATE) 1 g tablet Take 1 tablet (1 g total) by mouth 3 (three) times daily after meals. (Patient taking differently: Take 1 g by mouth daily as needed (stomach irritation). )  . TRESIBA FLEXTOUCH 100 UNIT/ML SOPN FlexTouch Pen Inject 10 Units into the skin [name] morning.     Cardiac Studies:   Peripheral arteriogram 08/09/2018: Widely patent bilateral renal arteries, no evidence of abdominal aortic aneurysm.  Aortoiliac bifurcation is widely patent.  Mild disease is noted in the femoral arteries. Severe small vessel disease, occluded left  AT and PT with single-vessel peroneal runoff.  AT is diffusely diseased in the proximal to mid segment followed by occlusion. Right AT and peroneal occluded.  Mild disease in the right PT.  Echocardiogram 09/21/2017: Left ventricle cavity is normal in size. Severe concentric hypertrophy of the left ventricle, both septal and posterior wall measuring 1.7 cm in AP dimension. Normal global wall motion. Grade II diastolic dysfunction. Elevated LAP. Calculated EF 59%. Left atrial cavity is mildly dilated. Mild aortic valve leaflet thickening. Moderate (Grade III) aortic regurgitation. Moderate (Grade II) mitral regurgitation. Moderate tricuspid regurgitation. Estimated pulmonary artery systolic pressure 31 mmHg.  Lexiscan myoview stress test 09/06/2017: 1. Lexiscan stress test was performed. Exercise capacity was not assessed. No stress symptoms reported. Peak blood pressure was 148/88 mmHg. The resting and stress electrocardiogram demonstrated normal sinus rhythm, normal resting conduction, no resting arrhythmias and normal rest repolarization. Possible old anteroseptal infarct. Stress EKG is non diagnostic for ischemia as it is a pharmacologic stress. 2. The overall quality of the study is excellent. There is no evidence of abnormal lung activity. Stress and rest SPECT images demonstrate homogeneous tracer distribution throughout the myocardium. Gated SPECT imaging reveals normal myocardial thickening and wall motion. The left ventricular ejection fraction was normal (74%). 3. Low risk study.  ABI 09/22/2017: Moderately abnormal waveforms of the right ankle. Severely abnormal waveforms of the left ankle. Mildly decreased right resting ABI at o.96 and moderately decreased left resting ABI  at 0.59.  Carotid artery duplex 09/21/2017: Minimal stenosis in the right internal carotid artery (1-15%) with heterogeneous plaque Antegrade right vertebral artery flow. Antegrade left vertebral artery flow.   Assessment:     ICD-10-CM   1. Peripheral arterial disease (HCC)  I73.9   2. Midfoot ulceration, right, with unspecified severity ([place])  L97.419   3. Uncontrolled type 2 diabetes mellitus with chronic kidney disease, without long-term current use of insulin (H[facility])  E11.22    E11.65   4. Former tobacco use  Z87.891   5. Rash and nonspecific skin eruption  R21   6. Pain and swelling of knee, unspecified laterality  M25.569    M25.469       Recommendations:   I have discussed PV angiogram results with the patient, in view of severe small vessel disease and and below the knee one-vessel disease, would recommend aggressive medical management unless she develops severe claudication symptoms or limb ischemia.  Patient will benefit from being on low-dose Xarelto in addition to aspirin.  Continue with statin therapy.  Denies any major bleeding, but she does mention occasional spotting and is trying to get in touch with gynecology for evaluation.  Samples of Xarelto were provided today.   No complications from procedure, rash has improved with use of Benadryl.  If she continues to have persistent rash, will send in short course of steroid therapy, but would like to avoid in view of uncontrolled diabetes.  Blood pressure is well controlled. She has been started on Lyrica for diabetic peripheral neuropathy. I have extensively discussed the importance of controlling her diabetes. She will continue with regular follow up with Dr. March Rummage for management of right foot ulceration.  She does have newly noted right foot lateral ulceration that is likely related to her wearing new shoes today, will closely monitor this.  Leg edema has improved since last seen by [place].  She does have bilateral knee swelling suggestive of osteoarthritis, may need orthopedic evaluation. Encouraged follow up with PCP.  I will see her back in 1 week to follow-up on newly noted right foot wound.  All questions were answered.   *I have  discussed this case with Dr. Virgina Jock and he personally examined the patient and participated in formulating the plan.*   Miquel Dunn, MSN, APRN, FNP-C [facility]. [place] Office: [phone] Fax: [phone]

## 2018-08-19 NOTE — Progress Notes (Signed)
Right lateral foot ulcer (blister) skin intact

## 2018-08-22 ENCOUNTER — Encounter: Payer: Self-pay | Admitting: Cardiology

## 2018-08-23 ENCOUNTER — Ambulatory Visit (HOSPITAL_COMMUNITY)
Admission: RE | Admit: 2018-08-23 | Discharge: 2018-08-23 | Disposition: A | Discharge status: Home / Self Care | Payer: Medicare Other | Source: Ambulatory Visit | Attending: Nephrology | Admitting: Nephrology

## 2018-08-23 ENCOUNTER — Other Ambulatory Visit: Payer: Self-pay

## 2018-08-23 VITALS — BP 118/63 | HR 66 | Temp 98.6°F | Resp 20

## 2018-08-23 DIAGNOSIS — D631 Anemia in chronic kidney disease: Secondary | ICD-10-CM | POA: Insufficient documentation

## 2018-08-23 DIAGNOSIS — N183 Chronic kidney disease, stage 3 (moderate): Secondary | ICD-10-CM | POA: Insufficient documentation

## 2018-08-23 DIAGNOSIS — D638 Anemia in other chronic diseases classified elsewhere: Secondary | ICD-10-CM

## 2018-08-23 LAB — POCT HEMOGLOBIN-HEMACUE: Hemoglobin: 10.3 g/dL — ABNORMAL LOW (ref 12.0–15.0)

## 2018-08-23 MED ORDER — EPOETIN ALFA 10000 UNIT/ML IJ SOLN
10000.0000 [IU] | INTRAMUSCULAR | Status: DC
  Administered 2018-08-23: 10000 [IU] via SUBCUTANEOUS

## 2018-08-23 MED ORDER — EPOETIN ALFA 10000 UNIT/ML IJ SOLN
INTRAMUSCULAR | Status: AC
  Filled 2018-08-23: qty 1

## 2018-08-25 ENCOUNTER — Ambulatory Visit (INDEPENDENT_AMBULATORY_CARE_PROVIDER_SITE_OTHER): Payer: Medicare Other | Admitting: Cardiology

## 2018-08-25 ENCOUNTER — Other Ambulatory Visit: Payer: Self-pay

## 2018-08-25 ENCOUNTER — Encounter: Payer: Self-pay | Admitting: Cardiology

## 2018-08-25 VITALS — BP 148/71 | HR 70 | Ht 65.0 in | Wt 132.0 lb

## 2018-08-25 DIAGNOSIS — E1165 Type 2 diabetes mellitus with hyperglycemia: Secondary | ICD-10-CM

## 2018-08-25 DIAGNOSIS — M25461 Effusion, right knee: Secondary | ICD-10-CM

## 2018-08-25 DIAGNOSIS — I739 Peripheral vascular disease, unspecified: Secondary | ICD-10-CM

## 2018-08-25 DIAGNOSIS — L97419 Non-pressure chronic ulcer of right heel and midfoot with unspecified severity: Secondary | ICD-10-CM | POA: Diagnosis not present

## 2018-08-25 DIAGNOSIS — E1122 Type 2 diabetes mellitus with diabetic chronic kidney disease: Secondary | ICD-10-CM

## 2018-08-25 DIAGNOSIS — IMO0002 Reserved for concepts with insufficient information to code with codable children: Secondary | ICD-10-CM

## 2018-08-25 DIAGNOSIS — R21 Rash and other nonspecific skin eruption: Secondary | ICD-10-CM | POA: Diagnosis not present

## 2018-08-25 DIAGNOSIS — M25561 Pain in right knee: Secondary | ICD-10-CM

## 2018-08-25 NOTE — Progress Notes (Signed)
Right lateral foot ulceration

## 2018-08-25 NOTE — Progress Notes (Signed)
Primary Physician:  Nolene Ebbs, MD   Patient ID: [name], female    DOB: Jan 27, 1951, 68 y.o.   MRN: [id number]  Subjective:    Chief Complaint  Patient presents with  . PAD  . Follow-up    HPI: [name]  is a 68 y.o. female  with type 2 diabetes, anemia likely from CKD with regular Procrit injections , chronic pancreatitis, CKD stage III, COPD, carotid stenosis diagnosed in October 2018, hypertension, PAD with diffuse disease by PV angiogram on 08/09/18, and hyperlipidemia.  She was initially evaluated by [place] for preoperative risk stratification for right foot diabetic foot ulceration debridement with Dr. March Rummage, in which was performed on 03/24/2018. She was also scheduled for laryngoscopy for tonsillar nodule with Dr. Reginia Naas; however, patient did not show for her procedure. Lexiscan nuclear stress test in July 2019 was considered low risk study. Echo performed also at that time showed normal LVEF with moderate AR.   She has history of chronic pancreatitis and has had several admissions over the last few months with most recent admission in April 2020 for acute on chronic pancreatitis. She is on Creon and PPI. Has not had any abdominal pain or back pain since discharge. LFT's were elevated during hospitalization; however, trended down at discharge. She continues to be on statin therapy. She continues to have non-healing ulceration on her right foot requiring frequent debridement. States that Dr. March Rummage is wanting to do surgery to remove "bone in her foot" to help with her callous, but would like for diabetes to be better controlled.   Due to abnormal LE arterial duplex and right foot ulceration, patient underwent PV angiogram on 08/09/2018 that showed severe small vessel disease, medical management was recommended. She is now on low dose Xarelto and ASA. Noted to have diffuse rash after her procedure that has been improving with Benadryl and has essentially resolved. Noted  to have new ulceration felt to be related to improper shoe fitting at her last office visit, but I had asked her to come back to see [place] today for close monitoring.   She has had improvement in lower extremity edema; however, continues to have swelling in bilateral knees with limited motion/stiffness in the right knee. She has been told to have arthritis in the past. No fever. Has occasional swelling in her ankles.  She does report some occasional vaginal bleeding and is trying to get an appt with her GYN. No worsening bleeding since being on Xarelto.  Past Medical History:  Diagnosis Date  . ANEMIA, CHRONIC DISEASE NEC 03/15/2006  . Carotid artery occlusion   . Cirrhosis ([place])   . Colon polyps   . Constipation due to opioid therapy 06/05/2014  . DEGENERATIVE JOINT DISEASE 05/06/2007   On going for >5 yrs No imaging to confirm Has tried ultram, mobic, neurontin which did not work Ambulance person tried once worked well    . Diabetes mellitus    Type II  . DIABETIC  RETINOPATHY 03/15/2006  . 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 Diastolic dysfunction 98/33/8250   Grade 1 by Echocardiogram 12/13/14  . Elevated liver enzymes   . Heart murmur   . Hemorrhoids   . History of kidney stones   . Hyperlipidemia 04/01/2011  . Hypertension   . Mild AI (aortic insufficiency)   . Moderate aortic regurgitation 09/25/2008   12/13/14 Echocardiogram    . Myalgia   . PAD (peripheral artery disease) ([place])   . PANCREATITIS, CHRONIC 03/29/2006  .  Peripheral arterial disease ([place])   . Pneumonia    age 3-20  . RENAL INSUFFICIENCY, CHRONIC 03/15/2006  . Tonsillar mass    Left    Past Surgical History:  Procedure Laterality Date  . BREAST SURGERY    . COLONOSCOPY    . DILATION AND CURETTAGE OF UTERUS N/A 06/16/2016   Procedure: DILATATION AND CURETTAGE;  Surgeon: Mora Bellman, MD;  Location: [facility];  Service: Gynecology;  Laterality: N/A;  . HYSTEROSCOPY N/A 06/16/2016   Procedure: HYSTEROSCOPY;  Surgeon:  Mora Bellman, MD;  Location: [facility];  Service: Gynecology;  Laterality: N/A;  . LOWER EXTREMITY ANGIOGRAPHY N/A 08/09/2018   Procedure: LOWER EXTREMITY ANGIOGRAPHY;  Surgeon: Adrian Prows, MD;  Location: [facility];  Service: Cardiovascular;  Laterality: N/A;  . MOUTH SURGERY N/A   . POLYPECTOMY N/A 06/16/2016   Procedure: POLYPECTOMY;  Surgeon: Mora Bellman, MD;  Location: [facility];  Service: Gynecology;  Laterality: N/A;    Social History   Socioeconomic History  . Marital status: Single    Spouse name: Not on file  . Number of children: 3  . Years of education: Not on file  . Highest education level: Not on file  Occupational History  . Occupation: retired- disabled  Social Needs  . Financial resource strain: Not on file  . Food insecurity    Worry: Not on file    Inability: Not on file  . Transportation needs    Medical: Not on file    Non-medical: Not on file  Tobacco Use  . Smoking status: Former Smoker    Packs/day: 0.30    Years: 44.00    Pack years: 13.20    Types: Cigarettes    Quit date: 03/2017    Years since quitting: 1.4  . Smokeless tobacco: Never Used  . Tobacco comment: started smoking in Nov 2019 few cigarettes a day  Substance and Sexual Activity  . Alcohol use: Not Currently    Alcohol/week: 0.0 standard drinks  . Drug use: No  . Sexual activity: Not on file  Lifestyle  . Physical activity    Days per week: Not on file    Minutes per session: Not on file  . Stress: Not on file  Relationships  . Social Herbalist on phone: Not on file    Gets together: Not on file    Attends religious service: Not on file    Active member of club or organization: Not on file    Attends meetings of clubs or organizations: Not on file    Relationship status: Not on file  . Intimate partner violence    Fear of current or ex partner: Not on file    Emotionally abused: Not on file    Physically abused: Not on file    Forced sexual activity: Not  on file  Other Topics Concern  . Not on file  Social History Narrative  . Not on file    Review of Systems  Constitution: Negative for decreased appetite, malaise/fatigue, weight gain and weight loss.  Eyes: Negative for visual disturbance.  Cardiovascular: Negative for chest pain, claudication, dyspnea on exertion, leg swelling, orthopnea, palpitations and syncope.  Respiratory: Negative for hemoptysis and wheezing.   Endocrine: Negative for cold intolerance and heat intolerance.  Hematologic/Lymphatic: Does not bruise/bleed easily.  Skin: Positive for poor wound healing. Negative for nail changes.  Musculoskeletal: Positive for joint pain (BILATERAL KNEES; R >L). Negative for back pain, muscle weakness and  myalgias.  Gastrointestinal: Negative for abdominal pain, change in bowel habit, nausea and vomiting.  Genitourinary: Positive for non-menstrual bleeding (minimal and only occasional).  Neurological: Negative for difficulty with concentration, dizziness, focal weakness and headaches.  Psychiatric/Behavioral: Negative for altered mental status and suicidal ideas.  All other systems reviewed and are negative.     Objective:  Blood pressure (!) 148/71, pulse 70, height 5\' 5"  (1.651 m), weight 132 lb (59.9 kg), last menstrual period 02/22/2016, SpO2 98 %. Body mass index is 21.97 kg/m.    Physical Exam  Constitutional: She is oriented to person, place, and time. Vital signs are normal. She appears well-developed and well-nourished.  HENT:  Head: Normocephalic and atraumatic.  Neck: Normal range of motion. Carotid bruit is present.  Cardiovascular: Normal rate, regular rhythm and intact distal pulses.  Murmur heard.  Harsh midsystolic murmur is present with a grade of 2/6 at the upper right sternal border radiating to the neck. Pulses:      Carotid pulses are on the right side with bruit and on the left side with bruit.      Femoral pulses are 2+ on the right side and 1+ on the  left side with bruit.      Popliteal pulses are 0 on the right side and 0 on the left side.       Dorsalis pedis pulses are 2+ on the right side and 2+ on the left side.       Posterior tibial pulses are 0 on the right side and 0 on the left side.  No leg edema Right foot ulceration bottom mid foot Right lateral foot ulcer. Skin intact   Pulmonary/Chest: Effort normal and breath sounds normal. No accessory muscle usage. No respiratory distress.  Abdominal: Soft. Bowel sounds are normal.  Musculoskeletal: Normal range of motion.     Right knee: She exhibits swelling, effusion and erythema.     Left ankle: She exhibits swelling. Tenderness.  Neurological: She is alert and oriented to person, place, and time.  Skin: Skin is warm and dry.  Right mid foot ulceration. Right lateral foot ulceration healing. Callus bilateral with thick rigid nails  Vitals reviewed.  Radiology: No results found.  Laboratory examination:    CMP Latest Ref Rng & Units 08/02/2018 07/31/2018 05/28/2018  Glucose 65 - 99 mg/dL 305(H) 182(H) 62(L)  BUN 8 - 27 mg/dL 35(H) 33(H) 15  Creatinine 0.57 - 1.00 mg/dL 1.26(H) 1.60(H) 0.97  Sodium 134 - 144 mmol/L 136 137 138  Potassium 3.5 - 5.2 mmol/L 4.5 3.6 3.9  Chloride 96 - 106 mmol/L 106 108 105  CO2 20 - 29 mmol/L 16(L) 20(L) 24  Calcium 8.7 - 10.3 mg/dL 8.6(L) 8.3(L) 8.6(L)  Total Protein 6.5 - 8.1 g/dL - 6.8 6.7  Total Bilirubin 0.3 - 1.2 mg/dL - 0.5 0.4  Alkaline Phos 38 - 126 U/L - 100 134(H)  AST 15 - 41 U/L - 59(H) 35  ALT 0 - 44 U/L - 55(H) 52(H)   CBC Latest Ref Rng & Units 08/23/2018 08/09/2018 08/02/2018  WBC 3.4 - 10.8 x10E3/uL - - 3.7  Hemoglobin 12.0 - 15.0 g/dL 10.3(L) 10.5(L) 10.1(L)  Hematocrit 34.0 - 46.6 % - - 32.4(L)  Platelets 150 - 450 x10E3/uL - - 227   Lipid Panel     Component Value Date/Time   CHOL 137 08/15/2015 1343   TRIG 78 08/15/2015 1343   HDL 99 08/15/2015 1343   CHOLHDL 1.4 08/15/2015 1343   CHOLHDL 2.5  12/11/2013 1008   VLDL  26 12/11/2013 1008   LDLCALC 22 08/15/2015 1343   HEMOGLOBIN A1C Lab Results  Component Value Date   HGBA1C 8.7 (H) 06/11/2017   MPG 202.99 06/11/2017   TSH No results for input(s): TSH in the last 8760 hours.  PRN Meds:. There are no discontinued medications. Current Meds  Medication Sig  . amLODipine (NORVASC) 10 MG tablet Take 10 mg by mouth daily.  Marland Kitchen aspirin EC 81 MG tablet Take 1 tablet (81 mg total) by mouth daily.  Marland Kitchen LINZESS 145 MCG CAPS capsule Take 145 mcg by mouth daily.   . meclizine (ANTIVERT) 25 MG tablet Take 25 mg by mouth [name] 8 (eight) hours as needed for dizziness or nausea.   Marland Kitchen omeprazole (PRILOSEC) 40 MG capsule Take 40 mg by mouth daily.  . Pancrelipase, Lip-Prot-Amyl, 24000-76000 units CPEP Take 4 capsules (96,000 Units total) by mouth See admin instructions. Take 4 capsules by mouth 2-3 times daily before meals (Patient taking differently: Take 2-4 capsules by mouth See admin instructions. Take 4 capsules by mouth two to three times a day with meals and 2 capsules with any snacks)  . pregabalin (LYRICA) 75 MG capsule Take 1 capsule (75 mg total) by mouth daily.  Marland Kitchen PROAIR HFA 108 (90 Base) MCG/ACT inhaler Inhale 2 puffs into the lungs 4 (four) times daily as needed for wheezing or shortness of breath.  . rivaroxaban (XARELTO) 2.5 MG TABS tablet Take 1 tablet (2.5 mg total) by mouth 2 (two) times daily.  . rosuvastatin (CRESTOR) 20 MG tablet TAKE 1 TABLET BY MOUTH [name] DAY (Patient taking differently: Take 20 mg by mouth daily. )  . sucralfate (CARAFATE) 1 g tablet Take 1 tablet (1 g total) by mouth 3 (three) times daily after meals. (Patient taking differently: Take 1 g by mouth daily as needed (stomach irritation). )  . TRESIBA FLEXTOUCH 100 UNIT/ML SOPN FlexTouch Pen Inject 10 Units into the skin [name] morning.     Cardiac Studies:   Peripheral arteriogram 08/09/2018: Widely patent bilateral renal arteries, no evidence of abdominal aortic aneurysm.   Aortoiliac bifurcation is widely patent.  Mild disease is noted in the femoral arteries. Severe small vessel disease, occluded left AT and PT with single-vessel peroneal runoff.  AT is diffusely diseased in the proximal to mid segment followed by occlusion. Right AT and peroneal occluded.  Mild disease in the right PT.  Echocardiogram 09/21/2017: Left ventricle cavity is normal in size. Severe concentric hypertrophy of the left ventricle, both septal and posterior wall measuring 1.7 cm in AP dimension. Normal global wall motion. Grade II diastolic dysfunction. Elevated LAP. Calculated EF 59%. Left atrial cavity is mildly dilated. Mild aortic valve leaflet thickening. Moderate (Grade III) aortic regurgitation. Moderate (Grade II) mitral regurgitation. Moderate tricuspid regurgitation. Estimated pulmonary artery systolic pressure 31 mmHg.  Lexiscan myoview stress test 09/06/2017: 1. Lexiscan stress test was performed. Exercise capacity was not assessed. No stress symptoms reported. Peak blood pressure was 148/88 mmHg. The resting and stress electrocardiogram demonstrated normal sinus rhythm, normal resting conduction, no resting arrhythmias and normal rest repolarization. Possible old anteroseptal infarct. Stress EKG is non diagnostic for ischemia as it is a pharmacologic stress. 2. The overall quality of the study is excellent. There is no evidence of abnormal lung activity. Stress and rest SPECT images demonstrate homogeneous tracer distribution throughout the myocardium. Gated SPECT imaging reveals normal myocardial thickening and wall motion. The left ventricular ejection fraction was normal (74%). 3. Low risk  study.  ABI 09/22/2017: Moderately abnormal waveforms of the right ankle. Severely abnormal waveforms of the left ankle. Mildly decreased right resting ABI at o.96 and moderately decreased left resting ABI at 0.59.  Carotid artery duplex 09/21/2017: Minimal stenosis in the right  internal carotid artery (1-15%) with heterogeneous plaque Antegrade right vertebral artery flow. Antegrade left vertebral artery flow.  Assessment:     ICD-10-CM   1. Peripheral arterial disease (HCC)  I73.9   2. Midfoot ulceration, right, with unspecified severity ([place])  L97.419   3. Uncontrolled type 2 diabetes mellitus with chronic kidney disease, without long-term current use of insulin (HCC)  E11.22    E11.65   4. Rash and nonspecific skin eruption  R21   5. Pain and swelling of right knee  M25.561    M25.461       Recommendations:   Right lateral foot ulceration does appear to be slightly better compared to one week ago. Scab is present, no drainage. She is to see Dr. March Rummage in the next 1-2 weeks and will be evaluated by him as well. Advised her to continue to closely monitor and if any signs of worsening, will need to let us know.   She has severe small vessel disease and and below the knee one-vessel disease, would recommend aggressive medical management unless she develops severe claudication symptoms or limb ischemia.  She was started on low dose Xarelto in addition to aspirin at her last office visit, but has not yet started. She was concerned about her kidney function. I have advised patient that her kidney function, although has previously been abnormal, was only mildly decreased. She is willing to start and is aware of bleeding risk. Advised her to contact us for any signs of major bleeding. She does have occasional vaginal spotting and is trying to get in contact with her GYN for appt. Continue with statin therapy.     Rash has essentially resolved with OTC benadryl.  Blood pressure is generally well controlled, but slightly elevated today. She is to see her PCP next week and will be reevaluated at that time. She has been started on Lyrica for diabetic peripheral neuropathy. I have extensively discussed the importance of controlling her diabetes. She will continue with regular  follow up with Dr. March Rummage for management of right foot ulceration.    She does have bilateral knee swelling suggestive of osteoarthritis, may need orthopedic evaluation. She will discuss with PCP next week.  I will see her back in 6 weeks for follow up on PAD and foot ulceration.   *I have discussed this case with Dr. Virgina Jock and he personally examined the patient and participated in formulating the plan.*   Miquel Dunn, MSN, APRN, FNP-C [facility]. [place] Office: [phone] Fax: [phone]

## 2018-08-31 ENCOUNTER — Other Ambulatory Visit: Payer: Self-pay | Admitting: Otolaryngology

## 2018-08-31 DIAGNOSIS — H9202 Otalgia, left ear: Secondary | ICD-10-CM

## 2018-08-31 DIAGNOSIS — R1310 Dysphagia, unspecified: Secondary | ICD-10-CM

## 2018-08-31 DIAGNOSIS — R221 Localized swelling, mass and lump, neck: Secondary | ICD-10-CM

## 2018-09-01 ENCOUNTER — Telehealth: Payer: Self-pay

## 2018-09-01 NOTE — Telephone Encounter (Signed)
After patient was informed of 13hr prep instructions, it was called in to her [facility] in [place].  Prednisone 50mg  PO 7/20 @ 0200, 0800 and 1400.  Benadryl 50mg  PO 7/20 @ 1400.

## 2018-09-05 ENCOUNTER — Ambulatory Visit: Payer: Medicare Other | Admitting: Podiatry

## 2018-09-06 ENCOUNTER — Encounter (HOSPITAL_COMMUNITY)
Admission: RE | Admit: 2018-09-06 | Discharge: 2018-09-06 | Disposition: A | Discharge status: Home / Self Care | Payer: Medicare Other | Source: Ambulatory Visit | Attending: Nephrology | Admitting: Nephrology

## 2018-09-06 ENCOUNTER — Other Ambulatory Visit: Payer: Self-pay

## 2018-09-06 VITALS — BP 113/54 | HR 73 | Resp 20

## 2018-09-06 DIAGNOSIS — D638 Anemia in other chronic diseases classified elsewhere: Secondary | ICD-10-CM | POA: Diagnosis present

## 2018-09-06 LAB — IRON AND TIBC
Iron: 35 ug/dL (ref 28–170)
Saturation Ratios: 12 % (ref 10.4–31.8)
TIBC: 286 ug/dL (ref 250–450)
UIBC: 251 ug/dL

## 2018-09-06 LAB — POCT HEMOGLOBIN-HEMACUE: Hemoglobin: 10.2 g/dL — ABNORMAL LOW (ref 12.0–15.0)

## 2018-09-06 LAB — FERRITIN: Ferritin: 42 ng/mL (ref 11–307)

## 2018-09-06 MED ORDER — EPOETIN ALFA 10000 UNIT/ML IJ SOLN
INTRAMUSCULAR | Status: AC
  Filled 2018-09-06: qty 1

## 2018-09-06 MED ORDER — EPOETIN ALFA 10000 UNIT/ML IJ SOLN
10000.0000 [IU] | INTRAMUSCULAR | Status: DC
  Administered 2018-09-06: 10000 [IU] via SUBCUTANEOUS

## 2018-09-08 ENCOUNTER — Encounter: Payer: Self-pay | Admitting: Podiatry

## 2018-09-08 ENCOUNTER — Other Ambulatory Visit: Payer: Self-pay

## 2018-09-08 ENCOUNTER — Telehealth: Payer: Self-pay | Admitting: *Deleted

## 2018-09-08 ENCOUNTER — Ambulatory Visit (INDEPENDENT_AMBULATORY_CARE_PROVIDER_SITE_OTHER): Payer: Medicare Other | Admitting: Podiatry

## 2018-09-08 VITALS — Temp 97.3°F

## 2018-09-08 DIAGNOSIS — L97411 Non-pressure chronic ulcer of right heel and midfoot limited to breakdown of skin: Secondary | ICD-10-CM

## 2018-09-08 DIAGNOSIS — E08621 Diabetes mellitus due to underlying condition with foot ulcer: Secondary | ICD-10-CM

## 2018-09-08 MED ORDER — SILVER SULFADIAZINE 1 % EX CREA: TOPICAL_CREAM | CUTANEOUS | 0 refills | Status: AC

## 2018-09-08 NOTE — Telephone Encounter (Signed)
Dr. March Rummage ordered collagen with silver, 2x2 sterile gauze, 1" tape, gloves, and saline for lateral right foot diabetic ulcer E11.42, measuring 0.5 x 0.5 x 0.2cm with low drainage from [facility]. Orders faxed to [facility].

## 2018-09-12 ENCOUNTER — Ambulatory Visit
Admission: RE | Admit: 2018-09-12 | Discharge: 2018-09-12 | Disposition: A | Discharge status: Home / Self Care | Payer: Medicare Other | Source: Ambulatory Visit | Attending: Otolaryngology | Admitting: Otolaryngology

## 2018-09-12 DIAGNOSIS — H9202 Otalgia, left ear: Secondary | ICD-10-CM

## 2018-09-12 DIAGNOSIS — R1310 Dysphagia, unspecified: Secondary | ICD-10-CM

## 2018-09-12 DIAGNOSIS — R221 Localized swelling, mass and lump, neck: Secondary | ICD-10-CM

## 2018-09-20 ENCOUNTER — Other Ambulatory Visit: Payer: Self-pay

## 2018-09-20 ENCOUNTER — Ambulatory Visit (HOSPITAL_COMMUNITY)
Admission: RE | Admit: 2018-09-20 | Discharge: 2018-09-20 | Disposition: A | Discharge status: Home / Self Care | Payer: Medicare Other | Source: Ambulatory Visit | Attending: Nephrology | Admitting: Nephrology

## 2018-09-20 VITALS — BP 119/55 | HR 68 | Temp 97.3°F | Resp 18

## 2018-09-20 DIAGNOSIS — D638 Anemia in other chronic diseases classified elsewhere: Secondary | ICD-10-CM

## 2018-09-20 LAB — POCT HEMOGLOBIN-HEMACUE: Hemoglobin: 10 g/dL — ABNORMAL LOW (ref 12.0–15.0)

## 2018-09-20 MED ORDER — EPOETIN ALFA 10000 UNIT/ML IJ SOLN
INTRAMUSCULAR | Status: AC
  Filled 2018-09-20: qty 1

## 2018-09-20 MED ORDER — EPOETIN ALFA 10000 UNIT/ML IJ SOLN
10000.0000 [IU] | INTRAMUSCULAR | Status: DC
  Administered 2018-09-20: 10000 [IU] via SUBCUTANEOUS

## 2018-09-22 ENCOUNTER — Ambulatory Visit: Payer: Medicare Other | Admitting: Podiatry

## 2018-09-23 ENCOUNTER — Ambulatory Visit (INDEPENDENT_AMBULATORY_CARE_PROVIDER_SITE_OTHER): Payer: Medicare Other | Admitting: Podiatry

## 2018-09-23 ENCOUNTER — Other Ambulatory Visit: Payer: Self-pay

## 2018-09-23 VITALS — Temp 97.7°F

## 2018-09-23 DIAGNOSIS — E08621 Diabetes mellitus due to underlying condition with foot ulcer: Secondary | ICD-10-CM | POA: Diagnosis not present

## 2018-09-23 DIAGNOSIS — L97411 Non-pressure chronic ulcer of right heel and midfoot limited to breakdown of skin: Secondary | ICD-10-CM

## 2018-09-25 NOTE — Progress Notes (Signed)
Subjective:  Patient ID: [name], female    DOB: [birth year],  MRN: [id number]  Chief Complaint  Patient presents with  . Diabetic Ulcer    wound care right    68 y.o. female presents for wound care.  States that the wound is starting to hurt again.  Is not wearing her boot because she is not sure how to strap it on.   Review of Systems: Negative except as noted in the HPI. Denies N/V/F/Ch.  Past Medical History:  Diagnosis Date  . ANEMIA, CHRONIC DISEASE NEC 03/15/2006  . Carotid artery occlusion   . Cirrhosis ([place])   . Colon polyps   . Constipation due to opioid therapy 06/05/2014  . DEGENERATIVE JOINT DISEASE 05/06/2007   On going for >5 yrs No imaging to confirm Has tried ultram, mobic, neurontin which did not work Ambulance person tried once worked well    . Diabetes mellitus    Type II  . DIABETIC  RETINOPATHY 03/15/2006  . 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 Diastolic dysfunction 87/56/4332   Grade 1 by Echocardiogram 12/13/14  . Elevated liver enzymes   . Heart murmur   . Hemorrhoids   . History of kidney stones   . Hyperlipidemia 04/01/2011  . Hypertension   . Mild AI (aortic insufficiency)   . Moderate aortic regurgitation 09/25/2008   12/13/14 Echocardiogram    . Myalgia   . PAD (peripheral artery disease) ([place])   . PANCREATITIS, CHRONIC 03/29/2006  . Peripheral arterial disease ([place])   . Pneumonia    age 57-20  . RENAL INSUFFICIENCY, CHRONIC 03/15/2006  . Tonsillar mass    Left    Current Outpatient Medications:  .  ACCU-CHEK AVIVA PLUS test strip, TEST three times a day, Disp: 100 each, Rfl: 11 .  amLODipine (NORVASC) 10 MG tablet, Take 10 mg by mouth daily., Disp: , Rfl:  .  aspirin EC 81 MG tablet, Take 1 tablet (81 mg total) by mouth daily., Disp: , Rfl:  .  Blood Glucose Monitoring Suppl (ACCU-CHEK AVIVA PLUS) w/Device KIT, Use to check you blood sugar 3 times a day, Disp: 1 kit, Rfl: 1 .  diclofenac sodium (VOLTAREN) 1 % GEL, APPLY 4 GRAMS 4 TIMES  DAILY AS NEEDED FOR PAIN, Disp: , Rfl:  .  LANTUS SOLOSTAR 100 UNIT/ML Solostar Pen, INJECT 20 UNITS INTO THE SKIN [name] MORNING, Disp: , Rfl:  .  LINZESS 145 MCG CAPS capsule, Take 145 mcg by mouth daily. , Disp: , Rfl:  .  meclizine (ANTIVERT) 25 MG tablet, Take 25 mg by mouth [name] 8 (eight) hours as needed for dizziness or nausea. , Disp: , Rfl:  .  omeprazole (PRILOSEC) 40 MG capsule, Take 40 mg by mouth daily., Disp: , Rfl:  .  Pancrelipase, Lip-Prot-Amyl, 24000-76000 units CPEP, Take 4 capsules (96,000 Units total) by mouth See admin instructions. Take 4 capsules by mouth 2-3 times daily before meals (Patient taking differently: Take 2-4 capsules by mouth See admin instructions. Take 4 capsules by mouth two to three times a day with meals and 2 capsules with any snacks), Disp: 1170 capsule, Rfl: 3 .  PAZEO 0.7 % SOLN, INT 1 GTT IN OU QPM, Disp: , Rfl:  .  predniSONE (DELTASONE) 50 MG tablet, , Disp: , Rfl:  .  pregabalin (LYRICA) 75 MG capsule, Take 1 capsule (75 mg total) by mouth daily., Disp: 60 capsule, Rfl: 0 .  PROAIR HFA 108 (90 Base) MCG/ACT inhaler, Inhale 2 puffs into the  lungs 4 (four) times daily as needed for wheezing or shortness of breath., Disp: , Rfl: 3 .  rivaroxaban (XARELTO) 2.5 MG TABS tablet, Take 1 tablet (2.5 mg total) by mouth 2 (two) times daily., Disp: 60 tablet, Rfl: 1 .  rosuvastatin (CRESTOR) 20 MG tablet, TAKE 1 TABLET BY MOUTH [name] DAY (Patient taking differently: Take 20 mg by mouth daily. ), Disp: 90 tablet, Rfl: 3 .  silver sulfADIAZINE (SILVADENE) 1 % cream, Apply pea-sized amount to wound daily., Disp: 50 g, Rfl: 0 .  sodium chloride (MURO 128) 5 % ophthalmic solution, , Disp: , Rfl:  .  sucralfate (CARAFATE) 1 g tablet, Take 1 tablet (1 g total) by mouth 3 (three) times daily after meals. (Patient taking differently: Take 1 g by mouth daily as needed (stomach irritation). ), Disp: 60 tablet, Rfl: 0 .  TRESIBA FLEXTOUCH 100 UNIT/ML SOPN FlexTouch Pen,  Inject 10 Units into the skin [name] morning. , Disp: , Rfl:   Social History   Tobacco Use  Smoking Status Former Smoker  . Packs/day: 0.30  . Years: 44.00  . Pack years: 13.20  . Types: Cigarettes  . Quit date: 03/2017  . Years since quitting: 1.5  Smokeless Tobacco Never Used  Tobacco Comment   started smoking in Nov 2019 few cigarettes a day    Allergies  Allergen Reactions  . Atorvastatin Other (See Comments)    Caused muscles in her back to be very sore  . Gabapentin Swelling    Legs and feet swell  . Contrast Media [Iodinated Diagnostic Agents] Rash  . Nicorette [Nicotine] Nausea Only   Objective:   Vitals:   09/23/18 0911  Temp: 97.7 F (36.5 C)   There is no height or weight on file to calculate BMI. Constitutional Well developed. Well nourished.  Vascular Dorsalis pedis pulses non-palpable bilaterally. Posterior tibial pulses non-palpable bilaterally. Capillary refill normal to all digits.  No cyanosis or clubbing noted. Pedal hair growth normal.  Neurologic Normal speech. Oriented to person, place, and time. Protective sensation absent  Dermatologic Wound Location: Right fifth MPJ Wound Base: Granular/Healthy Peri-wound: Calloused Exudate: None: wound tissue dry Wound Measurements: -0.5 x 0.3 post debridement  Orthopedic: No pain to palpation either foot.   Radiographs: None today Assessment:   1. Diabetic ulcer of right midfoot associated with diabetes mellitus due to underlying condition, limited to breakdown of skin [facility])    Plan:  Patient was evaluated and treated and all questions answered.  Ulcer Right Foot -Debridement as below. -Dressed with silvadene, DSD. -Continue off-loading with boot -Discussed once again proceeding with excision of the metatarsal head for curative ulceration healing once all of her other medical issues were worked up and she would like to proceed.  Procedure: Excisional Debridement of Wound Rationale: Removal  of non-viable soft tissue from the wound to promote healing.  Anesthesia: none Pre-Debridement Wound Measurements: overlying hyperkeratosis  Post-Debridement Wound Measurements: 0.3 cm x 0.5 cm x 0.2 cm  Type of Debridement: Sharp Excisional Tissue Removed: Non-viable soft tissue Depth of Debridement: subcutaneous tissue. Technique: Sharp excisional debridement to bleeding, viable wound base.  Dressing: Dry, sterile, compression dressing. Disposition: Patient tolerated procedure well. Patient to return in 1 week for follow-up.  No follow-ups on file.

## 2018-09-28 ENCOUNTER — Other Ambulatory Visit: Payer: Medicare Other

## 2018-10-04 ENCOUNTER — Ambulatory Visit (HOSPITAL_COMMUNITY)
Admission: RE | Admit: 2018-10-04 | Discharge: 2018-10-04 | Disposition: A | Discharge status: Home / Self Care | Payer: Medicare Other | Source: Ambulatory Visit | Attending: Nephrology | Admitting: Nephrology

## 2018-10-04 ENCOUNTER — Other Ambulatory Visit: Payer: Self-pay

## 2018-10-04 VITALS — BP 116/58 | HR 81 | Temp 96.1°F | Resp 18

## 2018-10-04 DIAGNOSIS — D638 Anemia in other chronic diseases classified elsewhere: Secondary | ICD-10-CM | POA: Diagnosis present

## 2018-10-04 LAB — IRON AND TIBC
Iron: 14 ug/dL — ABNORMAL LOW (ref 28–170)
Saturation Ratios: 7 % — ABNORMAL LOW (ref 10.4–31.8)
TIBC: 193 ug/dL — ABNORMAL LOW (ref 250–450)
UIBC: 179 ug/dL

## 2018-10-04 LAB — POCT HEMOGLOBIN-HEMACUE: Hemoglobin: 10.1 g/dL — ABNORMAL LOW (ref 12.0–15.0)

## 2018-10-04 LAB — FERRITIN: Ferritin: 378 ng/mL — ABNORMAL HIGH (ref 11–307)

## 2018-10-04 MED ORDER — EPOETIN ALFA 10000 UNIT/ML IJ SOLN
10000.0000 [IU] | INTRAMUSCULAR | Status: DC
  Administered 2018-10-04: 10000 [IU] via SUBCUTANEOUS

## 2018-10-04 MED ORDER — EPOETIN ALFA 10000 UNIT/ML IJ SOLN
INTRAMUSCULAR | Status: AC
  Filled 2018-10-04: qty 1

## 2018-10-05 ENCOUNTER — Other Ambulatory Visit: Payer: Medicare Other

## 2018-10-06 ENCOUNTER — Other Ambulatory Visit: Payer: Medicare Other

## 2018-10-07 ENCOUNTER — Ambulatory Visit: Payer: Medicare Other | Admitting: Cardiology

## 2018-10-07 NOTE — Progress Notes (Deleted)
Primary Physician:  Nolene Ebbs, MD   Patient ID: [name], female    DOB: November 01, 1950, 68 y.o.   MRN: [id number]  Subjective:    No chief complaint on file.   HPI: [name]  is a 68 y.o. female  with type 2 diabetes, anemia likely from CKD with regular Procrit injections , chronic pancreatitis, CKD stage III, COPD, carotid stenosis diagnosed in October 2018, hypertension, PAD with diffuse disease by PV angiogram on 08/09/18, and hyperlipidemia.  She was initially evaluated by [place] for preoperative risk stratification for right foot diabetic foot ulceration debridement with Dr. March Rummage, in which was performed on 03/24/2018. She was also scheduled for laryngoscopy for tonsillar nodule with Dr. Reginia Naas; however, patient did not show for her procedure. Lexiscan nuclear stress test in July 2019 was considered low risk study. Echo performed also at that time showed normal LVEF with moderate AR.   She has history of chronic pancreatitis and has had several admissions over the last few months with most recent admission in April 2020 for acute on chronic pancreatitis. She is on Creon and PPI. Has not had any abdominal pain or back pain since discharge. LFT's were elevated during hospitalization; however, trended down at discharge. She continues to be on statin therapy. She continues to have non-healing ulceration on her right foot requiring frequent debridement. States that Dr. March Rummage is wanting to do surgery to remove "bone in her foot" to help with her callous, but would like for diabetes to be better controlled.   Due to abnormal LE arterial duplex and right foot ulceration, patient underwent PV angiogram on 08/09/2018 that showed severe small vessel disease, medical management was recommended. She is now on low dose Xarelto and ASA. Noted to have diffuse rash after her procedure that has been improving with Benadryl and has essentially resolved. Noted to have new ulceration felt to be  related to improper shoe fitting at her last office visit, but I had asked her to come back to see [place] today for close monitoring.   She has had improvement in lower extremity edema; however, continues to have swelling in bilateral knees with limited motion/stiffness in the right knee. She has been told to have arthritis in the past. No fever. Has occasional swelling in her ankles.  She does report some occasional vaginal bleeding and is trying to get an appt with her GYN. No worsening bleeding since being on Xarelto.  Past Medical History:  Diagnosis Date  . ANEMIA, CHRONIC DISEASE NEC 03/15/2006  . Carotid artery occlusion   . Cirrhosis ([place])   . Colon polyps   . Constipation due to opioid therapy 06/05/2014  . DEGENERATIVE JOINT DISEASE 05/06/2007   On going for >5 yrs No imaging to confirm Has tried ultram, mobic, neurontin which did not work Ambulance person tried once worked well    . Diabetes mellitus    Type II  . DIABETIC  RETINOPATHY 03/15/2006  . 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 Diastolic dysfunction 40/98/1191   Grade 1 by Echocardiogram 12/13/14  . Elevated liver enzymes   . Heart murmur   . Hemorrhoids   . History of kidney stones   . Hyperlipidemia 04/01/2011  . Hypertension   . Mild AI (aortic insufficiency)   . Moderate aortic regurgitation 09/25/2008   12/13/14 Echocardiogram    . Myalgia   . PAD (peripheral artery disease) ([place])   . PANCREATITIS, CHRONIC 03/29/2006  . Peripheral arterial disease ([place])   . Pneumonia  age [age]  . RENAL INSUFFICIENCY, CHRONIC 03/15/2006  . Tonsillar mass    Left    Past Surgical History:  Procedure Laterality Date  . BREAST SURGERY    . COLONOSCOPY    . DILATION AND CURETTAGE OF UTERUS N/A 06/16/2016   Procedure: DILATATION AND CURETTAGE;  Surgeon: Mora Bellman, MD;  Location: [facility];  Service: Gynecology;  Laterality: N/A;  . HYSTEROSCOPY N/A 06/16/2016   Procedure: HYSTEROSCOPY;  Surgeon: Mora Bellman, MD;  Location: [facility];  Service: Gynecology;  Laterality: N/A;  . LOWER EXTREMITY ANGIOGRAPHY N/A 08/09/2018   Procedure: LOWER EXTREMITY ANGIOGRAPHY;  Surgeon: Adrian Prows, MD;  Location: [facility];  Service: Cardiovascular;  Laterality: N/A;  . MOUTH SURGERY N/A   . POLYPECTOMY N/A 06/16/2016   Procedure: POLYPECTOMY;  Surgeon: Mora Bellman, MD;  Location: [facility];  Service: Gynecology;  Laterality: N/A;    Social History   Socioeconomic History  . Marital status: Single    Spouse name: Not on file  . Number of children: 3  . Years of education: Not on file  . Highest education level: Not on file  Occupational History  . Occupation: retired- disabled  Social Needs  . Financial resource strain: Not on file  . Food insecurity    Worry: Not on file    Inability: Not on file  . Transportation needs    Medical: Not on file    Non-medical: Not on file  Tobacco Use  . Smoking status: Former Smoker    Packs/day: 0.30    Years: 44.00    Pack years: 13.20    Types: Cigarettes    Quit date: 03/2017    Years since quitting: 1.5  . Smokeless tobacco: Never Used  . Tobacco comment: started smoking in Nov 2019 few cigarettes a day  Substance and Sexual Activity  . Alcohol use: Not Currently    Alcohol/week: 0.0 standard drinks  . Drug use: No  . Sexual activity: Not on file  Lifestyle  . Physical activity    Days per week: Not on file    Minutes per session: Not on file  . Stress: Not on file  Relationships  . Social Herbalist on phone: Not on file    Gets together: Not on file    Attends religious service: Not on file    Active member of club or organization: Not on file    Attends meetings of clubs or organizations: Not on file    Relationship status: Not on file  . Intimate partner violence    Fear of current or ex partner: Not on file    Emotionally abused: Not on file    Physically abused: Not on file    Forced sexual activity: Not on file  Other Topics Concern  .  Not on file  Social History Narrative  . Not on file    Review of Systems  Constitution: Negative for decreased appetite, malaise/fatigue, weight gain and weight loss.  Eyes: Negative for visual disturbance.  Cardiovascular: Negative for chest pain, claudication, dyspnea on exertion, leg swelling, orthopnea, palpitations and syncope.  Respiratory: Negative for hemoptysis and wheezing.   Endocrine: Negative for cold intolerance and heat intolerance.  Hematologic/Lymphatic: Does not bruise/bleed easily.  Skin: Positive for poor wound healing. Negative for nail changes.  Musculoskeletal: Positive for joint pain (BILATERAL KNEES; R >L). Negative for back pain, muscle weakness and myalgias.  Gastrointestinal: Negative for abdominal pain, change in bowel habit,  nausea and vomiting.  Genitourinary: Positive for non-menstrual bleeding (minimal and only occasional).  Neurological: Negative for difficulty with concentration, dizziness, focal weakness and headaches.  Psychiatric/Behavioral: Negative for altered mental status and suicidal ideas.  All other systems reviewed and are negative.     Objective:  Last menstrual period 02/22/2016. There is no height or weight on file to calculate BMI.    Physical Exam  Constitutional: She is oriented to person, place, and time. Vital signs are normal. She appears well-developed and well-nourished.  HENT:  Head: Normocephalic and atraumatic.  Neck: Normal range of motion. Carotid bruit is present.  Cardiovascular: Normal rate, regular rhythm and intact distal pulses.  Murmur heard.  Harsh midsystolic murmur is present with a grade of 2/6 at the upper right sternal border radiating to the neck. Pulses:      Carotid pulses are on the right side with bruit and on the left side with bruit.      Femoral pulses are 2+ on the right side and 1+ on the left side with bruit.      Popliteal pulses are 0 on the right side and 0 on the left side.       Dorsalis  pedis pulses are 2+ on the right side and 2+ on the left side.       Posterior tibial pulses are 0 on the right side and 0 on the left side.  No leg edema Right foot ulceration bottom mid foot Right lateral foot ulcer. Skin intact   Pulmonary/Chest: Effort normal and breath sounds normal. No accessory muscle usage. No respiratory distress.  Abdominal: Soft. Bowel sounds are normal.  Musculoskeletal: Normal range of motion.     Right knee: She exhibits swelling, effusion and erythema.     Left ankle: She exhibits swelling. Tenderness.  Neurological: She is alert and oriented to person, place, and time.  Skin: Skin is warm and dry.  Right mid foot ulceration. Right lateral foot ulceration healing. Callus bilateral with thick rigid nails  Vitals reviewed.  Radiology: No results found.  Laboratory examination:    CMP Latest Ref Rng & Units 08/02/2018 07/31/2018 05/28/2018  Glucose 65 - 99 mg/dL 305(H) 182(H) 62(L)  BUN 8 - 27 mg/dL 35(H) 33(H) 15  Creatinine 0.57 - 1.00 mg/dL 1.26(H) 1.60(H) 0.97  Sodium 134 - 144 mmol/L 136 137 138  Potassium 3.5 - 5.2 mmol/L 4.5 3.6 3.9  Chloride 96 - 106 mmol/L 106 108 105  CO2 20 - 29 mmol/L 16(L) 20(L) 24  Calcium 8.7 - 10.3 mg/dL 8.6(L) 8.3(L) 8.6(L)  Total Protein 6.5 - 8.1 g/dL - 6.8 6.7  Total Bilirubin 0.3 - 1.2 mg/dL - 0.5 0.4  Alkaline Phos 38 - 126 U/L - 100 134(H)  AST 15 - 41 U/L - 59(H) 35  ALT 0 - 44 U/L - 55(H) 52(H)   CBC Latest Ref Rng & Units 10/04/2018 09/20/2018 09/06/2018  WBC 3.4 - 10.8 x10E3/uL - - -  Hemoglobin 12.0 - 15.0 g/dL 10.1(L) 10.0(L) 10.2(L)  Hematocrit 34.0 - 46.6 % - - -  Platelets 150 - 450 x10E3/uL - - -   Lipid Panel     Component Value Date/Time   CHOL 137 08/15/2015 1343   TRIG 78 08/15/2015 1343   HDL 99 08/15/2015 1343   CHOLHDL 1.4 08/15/2015 1343   CHOLHDL 2.5 12/11/2013 1008   VLDL 26 12/11/2013 1008   LDLCALC 22 08/15/2015 1343   HEMOGLOBIN A1C Lab Results  Component Value Date  HGBA1C 8.7  (H) 06/11/2017   MPG 202.99 06/11/2017   TSH No results for input(s): TSH in the last 8760 hours.  PRN Meds:. There are no discontinued medications. No outpatient medications have been marked as taking for the 10/07/18 encounter (Appointment) with Miquel Dunn, NP.    Cardiac Studies:   Peripheral arteriogram 08/09/2018: Widely patent bilateral renal arteries, no evidence of abdominal aortic aneurysm.  Aortoiliac bifurcation is widely patent.  Mild disease is noted in the femoral arteries. Severe small vessel disease, occluded left AT and PT with single-vessel peroneal runoff.  AT is diffusely diseased in the proximal to mid segment followed by occlusion. Right AT and peroneal occluded.  Mild disease in the right PT.  Echocardiogram 09/21/2017: Left ventricle cavity is normal in size. Severe concentric hypertrophy of the left ventricle, both septal and posterior wall measuring 1.7 cm in AP dimension. Normal global wall motion. Grade II diastolic dysfunction. Elevated LAP. Calculated EF 59%. Left atrial cavity is mildly dilated. Mild aortic valve leaflet thickening. Moderate (Grade III) aortic regurgitation. Moderate (Grade II) mitral regurgitation. Moderate tricuspid regurgitation. Estimated pulmonary artery systolic pressure 31 mmHg.  Lexiscan myoview stress test 09/06/2017: 1. Lexiscan stress test was performed. Exercise capacity was not assessed. No stress symptoms reported. Peak blood pressure was 148/88 mmHg. The resting and stress electrocardiogram demonstrated normal sinus rhythm, normal resting conduction, no resting arrhythmias and normal rest repolarization. Possible old anteroseptal infarct. Stress EKG is non diagnostic for ischemia as it is a pharmacologic stress. 2. The overall quality of the study is excellent. There is no evidence of abnormal lung activity. Stress and rest SPECT images demonstrate homogeneous tracer distribution throughout the myocardium. Gated  SPECT imaging reveals normal myocardial thickening and wall motion. The left ventricular ejection fraction was normal (74%). 3. Low risk study.  ABI 09/22/2017: Moderately abnormal waveforms of the right ankle. Severely abnormal waveforms of the left ankle. Mildly decreased right resting ABI at o.96 and moderately decreased left resting ABI at 0.59.  Carotid artery duplex 09/21/2017: Minimal stenosis in the right internal carotid artery (1-15%) with heterogeneous plaque Antegrade right vertebral artery flow. Antegrade left vertebral artery flow.  Assessment:   No diagnosis found.    Recommendations:   Right lateral foot ulceration does appear to be slightly better compared to one week ago. Scab is present, no drainage. She is to see Dr. March Rummage in the next 1-2 weeks and will be evaluated by him as well. Advised her to continue to closely monitor and if any signs of worsening, will need to let us know.   She has severe small vessel disease and and below the knee one-vessel disease, would recommend aggressive medical management unless she develops severe claudication symptoms or limb ischemia.  She was started on low dose Xarelto in addition to aspirin at her last office visit, but has not yet started. She was concerned about her kidney function. I have advised patient that her kidney function, although has previously been abnormal, was only mildly decreased. She is willing to start and is aware of bleeding risk. Advised her to contact us for any signs of major bleeding. She does have occasional vaginal spotting and is trying to get in contact with her GYN for appt. Continue with statin therapy.     Rash has essentially resolved with OTC benadryl.  Blood pressure is generally well controlled, but slightly elevated today. She is to see her PCP next week and will be reevaluated at that time. She has been  started on Lyrica for diabetic peripheral neuropathy. I have extensively discussed the  importance of controlling her diabetes. She will continue with regular follow up with Dr. March Rummage for management of right foot ulceration.    She does have bilateral knee swelling suggestive of osteoarthritis, may need orthopedic evaluation. She will discuss with PCP next week.  I will see her back in 6 weeks for follow up on PAD and foot ulceration.   *I have discussed this case with Dr. Virgina Jock and he personally examined the patient and participated in formulating the plan.*   Miquel Dunn, MSN, APRN, FNP-C [facility]. [place] Office: [phone] Fax: [phone]

## 2018-10-18 ENCOUNTER — Ambulatory Visit (HOSPITAL_COMMUNITY)
Admission: RE | Admit: 2018-10-18 | Discharge: 2018-10-18 | Disposition: A | Discharge status: Home / Self Care | Payer: Medicare Other | Source: Ambulatory Visit | Attending: Nephrology | Admitting: Nephrology

## 2018-10-18 ENCOUNTER — Other Ambulatory Visit: Payer: Self-pay

## 2018-10-18 VITALS — BP 106/51 | HR 67 | Temp 96.4°F | Resp 18

## 2018-10-18 DIAGNOSIS — D638 Anemia in other chronic diseases classified elsewhere: Secondary | ICD-10-CM | POA: Insufficient documentation

## 2018-10-18 LAB — POCT HEMOGLOBIN-HEMACUE: Hemoglobin: 9.6 g/dL — ABNORMAL LOW (ref 12.0–15.0)

## 2018-10-18 MED ORDER — EPOETIN ALFA 10000 UNIT/ML IJ SOLN
INTRAMUSCULAR | Status: AC
  Administered 2018-10-18: 10000 [IU] via SUBCUTANEOUS
  Filled 2018-10-18: qty 1

## 2018-10-18 MED ORDER — EPOETIN ALFA 10000 UNIT/ML IJ SOLN
10000.0000 [IU] | INTRAMUSCULAR | Status: DC
  Administered 2018-10-18: 14:00:00 10000 [IU] via SUBCUTANEOUS

## 2018-10-27 ENCOUNTER — Other Ambulatory Visit: Payer: Self-pay

## 2018-10-27 ENCOUNTER — Ambulatory Visit (INDEPENDENT_AMBULATORY_CARE_PROVIDER_SITE_OTHER): Payer: Medicare Other | Admitting: Podiatry

## 2018-10-27 VITALS — Temp 97.7°F

## 2018-10-27 DIAGNOSIS — E1142 Type 2 diabetes mellitus with diabetic polyneuropathy: Secondary | ICD-10-CM

## 2018-10-27 DIAGNOSIS — Q828 Other specified congenital malformations of skin: Secondary | ICD-10-CM | POA: Diagnosis not present

## 2018-10-28 ENCOUNTER — Telehealth: Payer: Self-pay | Admitting: Internal Medicine

## 2018-10-28 NOTE — Telephone Encounter (Signed)
Pt has been dealing with consti[ation and is requesting if Dr. Henrene Pastor could prescribe something, she uses [facility] on [address].

## 2018-10-28 NOTE — Telephone Encounter (Signed)
Called patient back and she states she has been constipated for 1 week. She had taken a laxative, 2 pills Sun and 2 pills Mon. with very little results. She is passing a lot of gas and is still taking Linzess daily. I suggested she take Miralax OTC 1-3 times a day until her stools return to normal. Stated she didn't think she could afford the Miralax, I suggested she get the generic. Patient agreed

## 2018-10-30 NOTE — Progress Notes (Signed)
Subjective:  Patient ID: [name], female    DOB: 02/04/1951,  MRN: [id number]  Chief Complaint  Patient presents with   Wound Check    Right plantar wound check. Pt denies drainage/fever/nausea/vomiting/chills.   Callouses    Callous trim bilateral    68 y.o. female presents for wound care.  States that the wound is starting to hurt again.  Is not wearing her boot because she is not sure how to strap it on.   Review of Systems: Negative except as noted in the HPI. Denies N/V/F/Ch.  Past Medical History:  Diagnosis Date   ANEMIA, CHRONIC DISEASE NEC 03/15/2006   Carotid artery occlusion    Cirrhosis ([facility])    Colon polyps    Constipation due to opioid therapy 06/05/2014   DEGENERATIVE JOINT DISEASE 05/06/2007   On going for >5 yrs No imaging to confirm Has tried ultram, mobic, neurontin which did not work Ambulance person tried once worked well     Diabetes mellitus    Type II   DIABETIC  RETINOPATHY 03/15/2006   DIABETIC PERIPHERAL NEUROPATHY 5/42/7062   Diastolic dysfunction 37/62/8315   Grade 1 by Echocardiogram 12/13/14   Elevated liver enzymes    Heart murmur    Hemorrhoids    History of kidney stones    Hyperlipidemia 04/01/2011   Hypertension    Mild AI (aortic insufficiency)    Moderate aortic regurgitation 09/25/2008   12/13/14 Echocardiogram     Myalgia    PAD (peripheral artery disease) ([place])    PANCREATITIS, CHRONIC 03/29/2006   Peripheral arterial disease ([facility])    Pneumonia    age [age]   RENAL INSUFFICIENCY, CHRONIC 03/15/2006   Tonsillar mass    Left    Current Outpatient Medications:    ACCU-CHEK AVIVA PLUS test strip, TEST three times a day, Disp: 100 each, Rfl: 11   amLODipine (NORVASC) 10 MG tablet, Take 10 mg by mouth daily., Disp: , Rfl:    aspirin EC 81 MG tablet, Take 1 tablet (81 mg total) by mouth daily., Disp: , Rfl:    Blood Glucose Monitoring Suppl (ACCU-CHEK AVIVA PLUS) w/Device KIT, Use to check you blood sugar 3  times a day, Disp: 1 kit, Rfl: 1   diclofenac sodium (VOLTAREN) 1 % GEL, APPLY 4 GRAMS 4 TIMES DAILY AS NEEDED FOR PAIN, Disp: , Rfl:    LANTUS SOLOSTAR 100 UNIT/ML Solostar Pen, INJECT 20 UNITS INTO THE SKIN [name] MORNING, Disp: , Rfl:    LINZESS 145 MCG CAPS capsule, Take 145 mcg by mouth daily. , Disp: , Rfl:    meclizine (ANTIVERT) 25 MG tablet, Take 25 mg by mouth [name] 8 (eight) hours as needed for dizziness or nausea. , Disp: , Rfl:    omeprazole (PRILOSEC) 40 MG capsule, Take 40 mg by mouth daily., Disp: , Rfl:    Pancrelipase, Lip-Prot-Amyl, 24000-76000 units CPEP, Take 4 capsules (96,000 Units total) by mouth See admin instructions. Take 4 capsules by mouth 2-3 times daily before meals (Patient taking differently: Take 2-4 capsules by mouth See admin instructions. Take 4 capsules by mouth two to three times a day with meals and 2 capsules with any snacks), Disp: 1170 capsule, Rfl: 3   PAZEO 0.7 % SOLN, INT 1 GTT IN OU QPM, Disp: , Rfl:    predniSONE (DELTASONE) 50 MG tablet, , Disp: , Rfl:    pregabalin (LYRICA) 75 MG capsule, Take 1 capsule (75 mg total) by mouth daily., Disp: 60 capsule, Rfl: 0  PROAIR HFA 108 (90 Base) MCG/ACT inhaler, Inhale 2 puffs into the lungs 4 (four) times daily as needed for wheezing or shortness of breath., Disp: , Rfl: 3   rivaroxaban (XARELTO) 2.5 MG TABS tablet, Take 1 tablet (2.5 mg total) by mouth 2 (two) times daily., Disp: 60 tablet, Rfl: 1   rosuvastatin (CRESTOR) 20 MG tablet, TAKE 1 TABLET BY MOUTH [name] DAY (Patient taking differently: Take 20 mg by mouth daily. ), Disp: 90 tablet, Rfl: 3   silver sulfADIAZINE (SILVADENE) 1 % cream, Apply pea-sized amount to wound daily., Disp: 50 g, Rfl: 0   sodium chloride (MURO 128) 5 % ophthalmic solution, , Disp: , Rfl:    sucralfate (CARAFATE) 1 g tablet, Take 1 tablet (1 g total) by mouth 3 (three) times daily after meals. (Patient taking differently: Take 1 g by mouth daily as needed (stomach  irritation). ), Disp: 60 tablet, Rfl: 0   TRESIBA FLEXTOUCH 100 UNIT/ML SOPN FlexTouch Pen, Inject 10 Units into the skin [name] morning. , Disp: , Rfl:   Social History   Tobacco Use  Smoking Status Former Smoker   Packs/day: 0.30   Years: 44.00   Pack years: 13.20   Types: Cigarettes   Quit date: 03/2017   Years since quitting: 1.5  Smokeless Tobacco Never Used  Tobacco Comment   started smoking in Nov 2019 few cigarettes a day    Allergies  Allergen Reactions   Atorvastatin Other (See Comments)    Caused muscles in her back to be very sore   Gabapentin Swelling    Legs and feet swell   Contrast Media [Iodinated Diagnostic Agents] Rash   Nicorette [Nicotine] Nausea Only   Objective:   Vitals:   10/27/18 0851  Temp: 97.7 F (36.5 C)   There is no height or weight on file to calculate BMI. Constitutional Well developed. Well nourished.  Vascular Dorsalis pedis pulses non-palpable bilaterally. Posterior tibial pulses non-palpable bilaterally. Capillary refill normal to all digits.  No cyanosis or clubbing noted. Pedal hair growth normal.  Neurologic Normal speech. Oriented to person, place, and time. Protective sensation absent  Dermatologic HPK R submet 5, submet 3; left submet 3, 5, heel  Orthopedic: No pain to palpation either foot.   Radiographs: None today Assessment:   1. DM type 2 with diabetic peripheral neuropathy ([place])   2. Porokeratosis    Plan:  Patient was evaluated and treated and all questions answered.  Ulcer Right Foot -No open area noted upon debridement. Other calluses trimmed  Procedure: Paring of Lesion Rationale: painful hyperkeratotic lesion Type of Debridement: manual, sharp debridement. Instrumentation: 312 blade Number of Lesions: 5   No follow-ups on file.

## 2018-11-01 ENCOUNTER — Other Ambulatory Visit: Payer: Self-pay

## 2018-11-01 ENCOUNTER — Ambulatory Visit (HOSPITAL_COMMUNITY)
Admission: RE | Admit: 2018-11-01 | Discharge: 2018-11-01 | Disposition: A | Discharge status: Home / Self Care | Payer: Medicare Other | Source: Ambulatory Visit | Attending: Nephrology | Admitting: Nephrology

## 2018-11-01 VITALS — BP 131/69 | HR 67 | Temp 97.2°F | Resp 18

## 2018-11-01 DIAGNOSIS — D638 Anemia in other chronic diseases classified elsewhere: Secondary | ICD-10-CM | POA: Diagnosis present

## 2018-11-01 LAB — FERRITIN: Ferritin: 27 ng/mL (ref 11–307)

## 2018-11-01 LAB — HEMOGLOBIN AND HEMATOCRIT, BLOOD
HCT: 32.6 % — ABNORMAL LOW (ref 36.0–46.0)
Hemoglobin: 9.7 g/dL — ABNORMAL LOW (ref 12.0–15.0)

## 2018-11-01 LAB — IRON AND TIBC
Iron: 40 ug/dL (ref 28–170)
Saturation Ratios: 16 % (ref 10.4–31.8)
TIBC: 255 ug/dL (ref 250–450)
UIBC: 215 ug/dL

## 2018-11-01 MED ORDER — EPOETIN ALFA 10000 UNIT/ML IJ SOLN
INTRAMUSCULAR | Status: AC
  Administered 2018-11-01: 10000 [IU] via SUBCUTANEOUS
  Filled 2018-11-01: qty 1

## 2018-11-01 MED ORDER — EPOETIN ALFA 10000 UNIT/ML IJ SOLN
10000.0000 [IU] | INTRAMUSCULAR | Status: DC
  Administered 2018-11-01: 15:00:00 10000 [IU] via SUBCUTANEOUS

## 2018-11-07 ENCOUNTER — Emergency Department (HOSPITAL_COMMUNITY)
Admission: EM | Admit: 2018-11-07 | Discharge: 2018-11-07 | Disposition: A | Discharge status: Home / Self Care | Payer: Medicare Other | Attending: Emergency Medicine | Admitting: Emergency Medicine

## 2018-11-07 ENCOUNTER — Emergency Department (HOSPITAL_COMMUNITY): Payer: Medicare Other

## 2018-11-07 ENCOUNTER — Other Ambulatory Visit: Payer: Self-pay

## 2018-11-07 DIAGNOSIS — J392 Other diseases of pharynx: Secondary | ICD-10-CM | POA: Diagnosis not present

## 2018-11-07 DIAGNOSIS — E1122 Type 2 diabetes mellitus with diabetic chronic kidney disease: Secondary | ICD-10-CM | POA: Diagnosis not present

## 2018-11-07 DIAGNOSIS — N183 Chronic kidney disease, stage 3 (moderate): Secondary | ICD-10-CM | POA: Diagnosis not present

## 2018-11-07 DIAGNOSIS — Z7982 Long term (current) use of aspirin: Secondary | ICD-10-CM | POA: Insufficient documentation

## 2018-11-07 DIAGNOSIS — I129 Hypertensive chronic kidney disease with stage 1 through stage 4 chronic kidney disease, or unspecified chronic kidney disease: Secondary | ICD-10-CM | POA: Insufficient documentation

## 2018-11-07 DIAGNOSIS — Z87891 Personal history of nicotine dependence: Secondary | ICD-10-CM | POA: Insufficient documentation

## 2018-11-07 DIAGNOSIS — Z79899 Other long term (current) drug therapy: Secondary | ICD-10-CM | POA: Diagnosis not present

## 2018-11-07 DIAGNOSIS — Z794 Long term (current) use of insulin: Secondary | ICD-10-CM | POA: Diagnosis not present

## 2018-11-07 DIAGNOSIS — R030 Elevated blood-pressure reading, without diagnosis of hypertension: Secondary | ICD-10-CM

## 2018-11-07 DIAGNOSIS — R070 Pain in throat: Secondary | ICD-10-CM | POA: Diagnosis present

## 2018-11-07 LAB — CBC WITH DIFFERENTIAL/PLATELET
Abs Immature Granulocytes: 0.01 10*3/uL (ref 0.00–0.07)
Basophils Absolute: 0 10*3/uL (ref 0.0–0.1)
Basophils Relative: 1 %
Eosinophils Absolute: 0.1 10*3/uL (ref 0.0–0.5)
Eosinophils Relative: 2 %
HCT: 34.3 % — ABNORMAL LOW (ref 36.0–46.0)
Hemoglobin: 10.2 g/dL — ABNORMAL LOW (ref 12.0–15.0)
Immature Granulocytes: 0 %
Lymphocytes Relative: 31 %
Lymphs Abs: 1 10*3/uL (ref 0.7–4.0)
MCH: 26.7 pg (ref 26.0–34.0)
MCHC: 29.7 g/dL — ABNORMAL LOW (ref 30.0–36.0)
MCV: 89.8 fL (ref 80.0–100.0)
Monocytes Absolute: 0.2 10*3/uL (ref 0.1–1.0)
Monocytes Relative: 7 %
Neutro Abs: 2 10*3/uL (ref 1.7–7.7)
Neutrophils Relative %: 59 %
Platelets: 154 10*3/uL (ref 150–400)
RBC: 3.82 MIL/uL — ABNORMAL LOW (ref 3.87–5.11)
RDW: 18.6 % — ABNORMAL HIGH (ref 11.5–15.5)
WBC: 3.3 10*3/uL — ABNORMAL LOW (ref 4.0–10.5)
nRBC: 0 % (ref 0.0–0.2)

## 2018-11-07 LAB — BASIC METABOLIC PANEL
Anion gap: 7 (ref 5–15)
BUN: 24 mg/dL — ABNORMAL HIGH (ref 8–23)
CO2: 22 mmol/L (ref 22–32)
Calcium: 8.1 mg/dL — ABNORMAL LOW (ref 8.9–10.3)
Chloride: 106 mmol/L (ref 98–111)
Creatinine, Ser: 1.32 mg/dL — ABNORMAL HIGH (ref 0.44–1.00)
GFR calc Af Amer: 49 mL/min — ABNORMAL LOW (ref >60)
GFR calc non Af Amer: 42 mL/min — ABNORMAL LOW (ref >60)
Glucose, Bld: 194 mg/dL — ABNORMAL HIGH (ref 70–99)
Potassium: 5.2 mmol/L — ABNORMAL HIGH (ref 3.5–5.1)
Sodium: 135 mmol/L (ref 135–145)

## 2018-11-07 MED ORDER — HYDROCORTISONE NA SUCCINATE PF 100 MG IJ SOLR
200.0000 mg | Freq: Once | INTRAMUSCULAR | Status: AC
  Administered 2018-11-07: 200 mg via INTRAVENOUS
  Filled 2018-11-07: qty 4

## 2018-11-07 MED ORDER — DIPHENHYDRAMINE HCL 25 MG PO CAPS: 50.0000 mg | ORAL_CAPSULE | Freq: Once | ORAL | Status: AC

## 2018-11-07 MED ORDER — HYDROCORTISONE NA SUCCINATE PF 250 MG IJ SOLR: 200.0000 mg | Freq: Once | INTRAMUSCULAR | Status: DC

## 2018-11-07 MED ORDER — IOHEXOL 300 MG/ML  SOLN
75.0000 mL | Freq: Once | INTRAMUSCULAR | Status: AC | PRN
  Administered 2018-11-07: 75 mL via INTRAVENOUS

## 2018-11-07 MED ORDER — SODIUM CHLORIDE 0.9 % IV BOLUS: 500.0000 mL | Freq: Once | INTRAVENOUS | Status: DC

## 2018-11-07 MED ORDER — DIPHENHYDRAMINE HCL 50 MG/ML IJ SOLN
50.0000 mg | Freq: Once | INTRAMUSCULAR | Status: AC
  Administered 2018-11-07: 50 mg via INTRAVENOUS
  Filled 2018-11-07: qty 1

## 2018-11-07 NOTE — ED Notes (Signed)
CT will send for her approx [phone]

## 2018-11-07 NOTE — ED Provider Notes (Signed)
[facility] EMERGENCY DEPARTMENT Provider Note   CSN: [id number] Arrival date & time: 11/07/18  7096     History   Chief Complaint Chief Complaint  Patient presents with  . Throat Pain    HPI [name] is a 68 y.o. female with past medical history significant for type 2 diabetes anemia of chronic disease, aortic regurgitation, chronic pancreatitis, PAD presents to emergency department today with chief complaint of sore throat. This has been going on x 1 year but has progressively worsened over there last 2 weeks. She describes pain as constant burning and aching sensation.  Pain radiates from her left ear to left side of throat into her neck. She rates pain is 10/10 in severity. She has taken tylenol without symptom relief. She is able to tolerate PO intake but states food has to be soft, mushy consistency. She states she has noticed facial swelling since onset, but none today.  She is followed by [facility] ENT and was supposed to have outpatient CT soft tissue neck in 08/2018 but missed the appointment because she was not feeling well. She has been unable to reschedule appointment. She admits to history of 35 years of tobacco use, recently quit x2 years ago. She denies fever, chills, weight loss, fatigue, trouble swallowing, voice changes, sinus pressure, congestion. History provided by patient with additional history obtained from chart review.      Past Medical History:  Diagnosis Date  . ANEMIA, CHRONIC DISEASE NEC 03/15/2006  . Carotid artery occlusion   . Cirrhosis ([place])   . Colon polyps   . Constipation due to opioid therapy 06/05/2014  . DEGENERATIVE JOINT DISEASE 05/06/2007   On going for >5 yrs No imaging to confirm Has tried ultram, mobic, neurontin which did not work Ambulance person tried once worked well    . Diabetes mellitus    Type II  . DIABETIC  RETINOPATHY 03/15/2006  . 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 Diastolic dysfunction 28/36/6294   Grade 1 by Echocardiogram 12/13/14  . Elevated liver enzymes   . Heart murmur   . Hemorrhoids   . History of kidney stones   . Hyperlipidemia 04/01/2011  . Hypertension   . Mild AI (aortic insufficiency)   . Moderate aortic regurgitation 09/25/2008   12/13/14 Echocardiogram    . Myalgia   . PAD (peripheral artery disease) ([place])   . PANCREATITIS, CHRONIC 03/29/2006  . Peripheral arterial disease ([place])   . Pneumonia    age 18-20  . RENAL INSUFFICIENCY, CHRONIC 03/15/2006  . Tonsillar mass    Left    Patient Active Problem List   Diagnosis Date Noted  . Critical lower limb ischemia 08/08/2018  . Diabetes mellitus type 2 in obese ([place]) 05/27/2018  . Acute on chronic pancreatitis ([place])   . Acute pancreatitis 05/26/2018  . Midfoot ulceration, right, with unspecified severity ([place]) 04/05/2018  . Acute pancreatitis with uninfected necrosis 04/01/2018  . Constipation 04/01/2018  . Recurrent epistaxis 05/31/2017  . Throat pain in adult 05/31/2017  . Tubular adenoma of colon 06/30/2016  . Diverticulosis of colon without hemorrhage 06/30/2016  . Postmenopausal vaginal bleeding   . Long term (current) use of opiate analgesic 04/09/2016  . Bilateral carotid artery disease ([place]) 11/28/2015  . Pancreatic insufficiency 05/09/2015  . 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 Risk for coronary artery disease greater than 20% in next 10 years 03/14/2015  . Diastolic dysfunction 76/54/6503  . Vulvar cyst 01/23/2015  . Abnormal uterine bleeding  12/02/2014  . Unexplained night sweats 12/02/2014  . Constipation due to opioid therapy 06/05/2014  . Loss of weight 03/29/2014  . Peripheral arterial disease ([place]) 12/12/2013  . GERD (gastroesophageal reflux disease) 10/12/2012  . Healthcare maintenance 09/29/2012  . Back pain 03/28/2012  . Hyperlipidemia 04/01/2011  . BOILS, RECURRENT 03/13/2010  . Moderate aortic regurgitation 09/25/2008  . HEMORRHOIDS 06/08/2008  . Osteoarthritis 05/06/2007   . Former tobacco use 09/27/2006  . History of osteoporosis 06/29/2006  . Chronic pancreatitis ([place]) 03/29/2006  . Uncontrolled type 2 diabetes mellitus with chronic kidney disease, without long-term current use of insulin ([place]) 03/15/2006  . Diabetic neuropathy, painful ([place]) 03/15/2006  . Anemia of chronic disease 03/15/2006  . Essential hypertension 03/15/2006  . VENTRICULAR HYPERTROPHY, LEFT 03/15/2006  . CKD stage 3 due to type 2 diabetes mellitus ([place]) 03/15/2006    Past Surgical History:  Procedure Laterality Date  . BREAST SURGERY    . COLONOSCOPY    . DILATION AND CURETTAGE OF UTERUS N/A 06/16/2016   Procedure: DILATATION AND CURETTAGE;  Surgeon: Mora Bellman, MD;  Location: [facility];  Service: Gynecology;  Laterality: N/A;  . HYSTEROSCOPY N/A 06/16/2016   Procedure: HYSTEROSCOPY;  Surgeon: Mora Bellman, MD;  Location: [facility];  Service: Gynecology;  Laterality: N/A;  . LOWER EXTREMITY ANGIOGRAPHY N/A 08/09/2018   Procedure: LOWER EXTREMITY ANGIOGRAPHY;  Surgeon: Adrian Prows, MD;  Location: [facility];  Service: Cardiovascular;  Laterality: N/A;  . MOUTH SURGERY N/A   . POLYPECTOMY N/A 06/16/2016   Procedure: POLYPECTOMY;  Surgeon: Mora Bellman, MD;  Location: [facility];  Service: Gynecology;  Laterality: N/A;     OB History    Gravida  3   Para  3   Term  3   Preterm      AB      Living  3     SAB      TAB      Ectopic      Multiple      Live Births  3            Home Medications    Prior to Admission medications   Medication Sig Start Date End Date Taking? Authorizing Provider  ACCU-CHEK AVIVA PLUS test strip TEST three times a day 11/25/15   Lucious Groves, DO  amLODipine (NORVASC) 10 MG tablet Take 10 mg by mouth daily. 06/01/17   [provider]  aspirin EC 81 MG tablet Take 1 tablet (81 mg total) by mouth daily. 04/09/16   Lucious Groves, DO  Blood Glucose Monitoring Suppl (ACCU-CHEK AVIVA PLUS) w/Device KIT Use to check you  blood sugar 3 times a day 06/04/15   Oval Linsey, MD  diclofenac sodium (VOLTAREN) 1 % GEL APPLY 4 GRAMS 4 TIMES DAILY AS NEEDED FOR PAIN 08/30/18   [provider]  LANTUS SOLOSTAR 100 UNIT/ML Solostar Pen INJECT 20 UNITS INTO THE SKIN EVERY MORNING 09/07/18   [provider]  LINZESS 145 MCG CAPS capsule Take 145 mcg by mouth daily.  03/07/17   [provider]  meclizine (ANTIVERT) 25 MG tablet Take 25 mg by mouth every 8 (eight) hours as needed for dizziness or nausea.  06/11/18   [provider]  omeprazole (PRILOSEC) 40 MG capsule Take 40 mg by mouth daily. 04/16/18   [provider]  Pancrelipase, Lip-Prot-Amyl, 24000-76000 units CPEP Take 4 capsules (96,000 Units total) by mouth See admin instructions. Take 4 capsules by mouth 2-3 times daily  before meals Patient taking differently: Take 2-4 capsules by mouth See admin instructions. Take 4 capsules by mouth two to three times a day with meals and 2 capsules with any snacks 03/10/16   Joni Reining C, DO  PAZEO 0.7 % SOLN INT 1 GTT IN OU QPM 08/29/18   [provider]  predniSONE (DELTASONE) 50 MG tablet  09/01/18   [provider]  pregabalin (LYRICA) 75 MG capsule Take 1 capsule (75 mg total) by mouth daily. 08/04/18   Trula Slade, DPM  PROAIR HFA 108 [phone] Base) MCG/ACT inhaler Inhale 2 puffs into the lungs 4 (four) times daily as needed for wheezing or shortness of breath. 04/30/17   [provider]  rivaroxaban (XARELTO) 2.5 MG TABS tablet Take 1 tablet (2.5 mg total) by mouth 2 (two) times daily. 08/19/18   Miquel Dunn, NP  rosuvastatin (CRESTOR) 20 MG tablet TAKE 1 TABLET BY MOUTH EVERY DAY Patient taking differently: Take 20 mg by mouth daily.  04/04/18   Miquel Dunn, NP  silver sulfADIAZINE (SILVADENE) 1 % cream Apply pea-sized amount to wound daily. 09/08/18   Evelina Bucy, DPM  sodium chloride (MURO 128) 5 % ophthalmic solution  08/31/18   [provider]  sucralfate (CARAFATE) 1 g tablet Take 1 tablet (1 g total) by mouth 3 (three) times daily after meals. Patient taking differently: Take 1 g by mouth daily as needed (stomach irritation).  04/22/18   Pyrtle, Lajuan Lines, MD  TRESIBA FLEXTOUCH 100 UNIT/ML SOPN FlexTouch Pen Inject 10 Units into the skin every morning.  05/17/18   [provider]    Family History Family History  Problem Relation Age of Onset  . Dementia Mother   . Colon cancer Neg Hx     Social History Social History   Tobacco Use  . Smoking status: Former Smoker    Packs/day: 0.30    Years: 44.00    Pack years: 13.20    Types: Cigarettes    Quit date: 03/2017    Years since quitting: 1.6  . Smokeless tobacco: Never Used  . Tobacco comment: started smoking in Nov 2019 few cigarettes a day  Substance Use Topics  . Alcohol use: Not Currently    Alcohol/week: 0.0 standard drinks  . Drug use: No     Allergies   Atorvastatin, Gabapentin, Contrast media [iodinated diagnostic agents], and Nicorette [nicotine]   Review of Systems Review of Systems  Constitutional: Negative for chills, fatigue, fever and unexpected weight change.  HENT: Positive for sore throat. Negative for congestion, ear discharge, ear pain, sinus pressure, sinus pain, tinnitus, trouble swallowing and voice change.   Eyes: Negative for pain and redness.  Respiratory: Negative for cough and shortness of breath.   Cardiovascular: Negative for chest pain.  Gastrointestinal: Negative for abdominal pain, constipation, diarrhea, nausea and vomiting.  Genitourinary: Negative for dysuria and hematuria.  Musculoskeletal: Negative for back pain and neck pain.  Skin: Negative for wound.  Neurological: Negative for weakness, numbness and headaches.     Physical Exam Updated Vital Signs BP 130/63 (BP Location: Right Arm)   Pulse 81   Temp 98.5 F (36.9 C) (Oral)   Resp 17   LMP 02/22/2016 (LMP Unknown)   SpO2 99%   Physical  Exam Vitals signs and nursing note reviewed.  Constitutional:      General: She is not in acute distress.    Appearance: She is not ill-appearing.  HENT:  Head: Normocephalic and atraumatic.     Comments: No sinus or temporal tenderness.    Right Ear: Tympanic membrane and external ear normal. No swelling or tenderness. No mastoid tenderness. Tympanic membrane is not erythematous or bulging.     Left Ear: Tympanic membrane and external ear normal. No swelling or tenderness. No mastoid tenderness. Tympanic membrane is not erythematous or bulging.     Nose: Nose normal.     Mouth/Throat:     Mouth: Mucous membranes are moist.     Pharynx: Oropharynx is clear.     Comments: Minor erythema to oropharynx, no edema, no exudate, no tonsillar swelling, voice normal, neck supple without lymphadenopathy Eyes:     General: No scleral icterus.       Right eye: No discharge.        Left eye: No discharge.     Extraocular Movements: Extraocular movements intact.     Conjunctiva/sclera: Conjunctivae normal.     Pupils: Pupils are equal, round, and reactive to light.  Neck:     Musculoskeletal: Normal range of motion.     Vascular: No JVD.  Cardiovascular:     Rate and Rhythm: Normal rate and regular rhythm.     Pulses: Normal pulses.          Radial pulses are 2+ on the right side and 2+ on the left side.     Heart sounds: Normal heart sounds.  Pulmonary:     Comments: Lungs clear to auscultation in all fields. Symmetric chest rise. No wheezing, rales, or rhonchi. Abdominal:     Comments: Abdomen is soft, non-distended, and non-tender in all quadrants. No rigidity, no guarding. No peritoneal signs.  Musculoskeletal: Normal range of motion.  Skin:    General: Skin is warm and dry.     Capillary Refill: Capillary refill takes less than 2 seconds.  Neurological:     Mental Status: She is oriented to person, place, and time.     GCS: GCS eye subscore is 4. GCS verbal subscore is 5. GCS motor  subscore is 6.     Comments: Fluent speech, no facial droop.  Psychiatric:        Behavior: Behavior normal.      ED Treatments / Results  Labs (all labs ordered are listed, but only abnormal results are displayed) Labs Reviewed  BASIC METABOLIC PANEL - Abnormal; Notable for the following components:      Result Value   Potassium 5.2 (*)    Glucose, Bld 194 (*)    BUN 24 (*)    Creatinine, Ser 1.32 (*)    Calcium 8.1 (*)    GFR calc non Af Amer 42 (*)    GFR calc Af Amer 49 (*)    All other components within normal limits  CBC WITH DIFFERENTIAL/PLATELET - Abnormal; Notable for the following components:   WBC 3.3 (*)    RBC 3.82 (*)    Hemoglobin 10.2 (*)    HCT 34.3 (*)    MCHC 29.7 (*)    RDW 18.6 (*)    All other components within normal limits    EKG None  Radiology No results found.  Procedures Procedures (including critical care time)  Medications Ordered in ED Medications  diphenhydrAMINE (BENADRYL) capsule 50 mg ( Oral See Alternative 11/07/18 1519)    Or  diphenhydrAMINE (BENADRYL) injection 50 mg (50 mg Intravenous Given 11/07/18 1519)  hydrocortisone sodium succinate (SOLU-CORTEF) 100 MG injection 200 mg (200 mg Intravenous Given  11/07/18 1224)  iohexol (OMNIPAQUE) 300 MG/ML solution 75 mL (75 mLs Intravenous Contrast Given 11/07/18 1643)     Initial Impression / Assessment and Plan / ED Course  I have reviewed the triage vital signs and the nursing notes.  Pertinent labs & imaging results that were available during my care of the patient were reviewed by me and considered in my medical decision making (see chart for details).  Patient seen and examined. Patient nontoxic appearing, in no apparent distress, vitals WNL.  On exam she has no external facial swelling.  She does have minor erythema to left side posterior oropharynx, no exudate seen.  Lungs are clear to auscultation in all fields. No cervical adenopathy. Full ROM of neck.  Labs today show  elevated creatinine, similar to baseline.  CBC with known anemia, hemoglobin stable at 10.2.  With concern for malignancy will CT neck with contrast.  Patient has had multiple CT scans with contrast.  At her most recent one she had a rash after receiving contrast that resolved after Benadryl.  Discussed case with radiologist who recommends 4 hour pre medications.   CT scan is pending at the end of my shift. Patient care transferred to B. Morelli PA-C. Patient presentation, ED course, and plan of care discussed with review of all pertinent labs and imaging. Please see his note for further details regarding further ED course and disposition.  CT scan results will determine disposition.   Portions of this note were generated with [employer]. Dictation errors may occur despite best attempts at proofreading.    Final Clinical Impressions(s) / ED Diagnoses   Final diagnoses:  None    ED Discharge Orders    None       Flint Melter 11/07/18 1652    Lacretia Leigh, MD 11/08/18 1601

## 2018-11-07 NOTE — ED Notes (Signed)
Pt dc'd home w/all belongings, a/o x4, drove self home

## 2018-11-07 NOTE — ED Provider Notes (Signed)
Care handoff received from [facility], PA-C at shift change please see her note for full details.  In short 68 year old female with history of diabetes, smoking, anemia, aortic regurg, pancreatitis, PAD presents with sore throat x1 year.  Pain is left-sided she has been evaluated by ENT for this and was scheduled for a CT soft tissue neck but missed her appointment.  Some erythema was noted in the posterior left oropharynx by previous team.  There is concern for malignancy and CT soft tissue neck with contrast was ordered.  Patient is currently being premedicated for CT scan as she has history of allergy to contrast.  Plan of care is to follow-up on CT scan and disposition accordingly. Physical Exam  BP (!) 145/61   Pulse 65   Temp 98.5 F (36.9 C) (Oral)   Resp 16   Ht 5\' 5"  (1.651 m)   Wt 59 kg   LMP 02/22/2016 (LMP Unknown)   SpO2 100%   BMI 21.63 kg/m   Physical Exam Constitutional:      General: She is not in acute distress.    Appearance: Normal appearance. She is well-developed. She is not ill-appearing or diaphoretic.  HENT:     Head: Normocephalic and atraumatic.     Jaw: There is normal jaw occlusion. No trismus.     Right Ear: External ear normal.     Left Ear: External ear normal.     Nose: Nose normal.     Mouth/Throat:     Mouth: Mucous membranes are moist.     Pharynx: Posterior oropharyngeal erythema present.      Comments: Erythema of the left posterior oropharynx.  The patient has normal phonation and is in control of secretions. No stridor.  Midline uvula.  Tonsils appear symmetric without exudate.  Tongue protrusion is normal, floor of mouth is soft. No trismus. No creptius on neck palpation. No gingival erythema or fluctuance noted. Mucus membranes moist. Eyes:     General: Vision grossly intact. Gaze aligned appropriately.     Pupils: Pupils are equal, round, and reactive to light.  Neck:     Musculoskeletal: Normal range of motion.     Trachea:  Trachea and phonation normal. No tracheal deviation.  Pulmonary:     Effort: Pulmonary effort is normal. No respiratory distress.  Musculoskeletal: Normal range of motion.  Skin:    General: Skin is warm and dry.  Neurological:     Mental Status: She is alert.     GCS: GCS eye subscore is 4. GCS verbal subscore is 5. GCS motor subscore is 6.     Comments: Speech is clear and goal oriented, follows commands Major Cranial nerves without deficit, no facial droop Moves extremities without ataxia, coordination intact  Psychiatric:        Behavior: Behavior normal.     ED Course/Procedures   Clinical Course as of Nov 07 1847  Mon Nov 07, 2018  1846 Dr. Blenda Nicely   [BM]    Clinical Course User Index [BM] Deliah Boston, PA-C    Procedures  MDM  CBC nonacute BMP appears baseline with elevated creatinine   CT Soft Tissue Neck:  IMPRESSION:  New 2.6 cm left superior oropharyngeal mass and left level II  lymphadenopathy consistent with metastatic squamous cell carcinoma.    Aortic Atherosclerosis (ICD10-I70.0) and Emphysema (ICD10-J43.9).   Patient updated on results today states understanding of concern and need for ENT follow-up appointment diagnosis. - Discussed case with patient's otolaryngologist Dr. Blenda Nicely  who is asked for patient to call their office first thing tomorrow morning to schedule a follow-up appointment.  No further work-up indicated here in the ED. - Patient reevaluated, updated on care plan she states understanding of results today and need to call the office tomorrow morning.  She has no further questions or concerns. - Patient was noted to have elevated blood pressure reading today.  Patient made aware of elevated blood pressure reading and need to take daily medications as prescribed.  She is to call her primary care doctor's office tomorrow to schedule follow-up appointment for blood pressure recheck and medication management within 1 week.   Patient informed of signs and symptoms of hypertensive urgency/emergency and to return to the emergency department immediately if they occur.  She is asymptomatic regarding her elevated blood pressure reading.  At this time there does not appear to be any evidence of an acute emergency medical condition and the patient appears stable for discharge with appropriate outpatient follow up. Diagnosis was discussed with patient who verbalizes understanding of care plan and is agreeable to discharge. I have discussed return precautions with patient who verbalizes understanding of return precautions. Patient encouraged to follow-up with their PCP and ENT. All questions answered.  Patient's case discussed with Dr. Maryan Rued who agrees with plan to discharge with follow-up.   Note: Portions of this report may have been transcribed using voice recognition software. Every effort was made to ensure accuracy; however, inadvertent computerized transcription errors may still be present.   Gari Crown 11/07/18 1924    Blanchie Dessert, MD 11/07/18 2242

## 2018-11-07 NOTE — ED Notes (Signed)
Patient transported to CT 

## 2018-11-07 NOTE — Discharge Instructions (Addendum)
You have been diagnosed today with oral pharyngeal mass, elevated blood pressure reading.  At this time there does not appear to be the presence of an emergent medical condition, however there is always the potential for conditions to change. Please read and follow the below instructions.  Please return to the Emergency Department immediately for any new or worsening symptoms. Please be sure to follow up with your Primary Care Provider within one week regarding your visit today; please call their office to schedule an appointment even if you are feeling better for a follow-up visit. Call Dr. Blenda Nicely, your ear nose and throat doctor's office first thing tomorrow morning to schedule an appointment regarding your mass on CT scan.  As we discussed this is concerning for a possible cancer but you must see your ear nose and throat specialist as soon as possible for further evaluation and definitive diagnosis.  Please call their office tomorrow morning. Your blood pressure was elevated today.  Please be sure to take your blood pressure medications as prescribed by your primary care provider.  Call your primary care provider tomorrow to schedule a blood pressure recheck and for possible medication management within 1 week.  Get help right away if: You have trouble breathing. You cannot swallow fluids, soft foods, or your saliva. You have swelling in your throat or neck that gets worse. You keep feeling sick to your stomach (nauseous). You keep throwing up (vomiting). Get a very bad headache. Start to feel mixed up (confused). Feel weak or numb. Feel faint. Have very bad pain in your: Chest. Belly (abdomen). Throw up more than once. Have trouble breathing. You have any new/concerning or worsening symptoms  Please read the additional information packets attached to your discharge summary.

## 2018-11-07 NOTE — ED Triage Notes (Signed)
Patient reports sore throat for weeks, has been seen for it before and states her doctor wanted to put in for a ct scan of it but she hasn't been able to make the appointment. Denies fevers/chills, no other symptoms at this time. NAD noted.

## 2018-11-08 ENCOUNTER — Emergency Department (HOSPITAL_COMMUNITY)
Admission: EM | Admit: 2018-11-08 | Discharge: 2018-11-08 | Disposition: A | Discharge status: Home / Self Care | Payer: Medicare Other | Attending: Emergency Medicine | Admitting: Emergency Medicine

## 2018-11-08 ENCOUNTER — Other Ambulatory Visit: Payer: Self-pay

## 2018-11-08 ENCOUNTER — Encounter (HOSPITAL_COMMUNITY): Payer: Self-pay | Admitting: Emergency Medicine

## 2018-11-08 DIAGNOSIS — Z794 Long term (current) use of insulin: Secondary | ICD-10-CM | POA: Diagnosis not present

## 2018-11-08 DIAGNOSIS — N183 Chronic kidney disease, stage 3 (moderate): Secondary | ICD-10-CM | POA: Insufficient documentation

## 2018-11-08 DIAGNOSIS — Z87891 Personal history of nicotine dependence: Secondary | ICD-10-CM | POA: Diagnosis not present

## 2018-11-08 DIAGNOSIS — Z7901 Long term (current) use of anticoagulants: Secondary | ICD-10-CM | POA: Insufficient documentation

## 2018-11-08 DIAGNOSIS — I5030 Unspecified diastolic (congestive) heart failure: Secondary | ICD-10-CM | POA: Diagnosis not present

## 2018-11-08 DIAGNOSIS — E1165 Type 2 diabetes mellitus with hyperglycemia: Secondary | ICD-10-CM | POA: Insufficient documentation

## 2018-11-08 DIAGNOSIS — Z79899 Other long term (current) drug therapy: Secondary | ICD-10-CM | POA: Diagnosis not present

## 2018-11-08 DIAGNOSIS — I130 Hypertensive heart and chronic kidney disease with heart failure and stage 1 through stage 4 chronic kidney disease, or unspecified chronic kidney disease: Secondary | ICD-10-CM | POA: Diagnosis not present

## 2018-11-08 DIAGNOSIS — Z7982 Long term (current) use of aspirin: Secondary | ICD-10-CM | POA: Insufficient documentation

## 2018-11-08 DIAGNOSIS — E1122 Type 2 diabetes mellitus with diabetic chronic kidney disease: Secondary | ICD-10-CM | POA: Insufficient documentation

## 2018-11-08 DIAGNOSIS — R739 Hyperglycemia, unspecified: Secondary | ICD-10-CM

## 2018-11-08 DIAGNOSIS — I2510 Atherosclerotic heart disease of native coronary artery without angina pectoris: Secondary | ICD-10-CM | POA: Diagnosis not present

## 2018-11-08 LAB — URINALYSIS, ROUTINE W REFLEX MICROSCOPIC
Bacteria, UA: NONE SEEN
Bilirubin Urine: NEGATIVE
Glucose, UA: >=500 mg/dL — AB
Hgb urine dipstick: NEGATIVE
Ketones, ur: NEGATIVE mg/dL
Leukocytes,Ua: NEGATIVE
Nitrite: NEGATIVE
Protein, ur: NEGATIVE mg/dL
Specific Gravity, Urine: 1.014 (ref 1.005–1.030)
pH: 6 (ref 5.0–8.0)

## 2018-11-08 LAB — CBC WITH DIFFERENTIAL/PLATELET
Abs Immature Granulocytes: 0.01 10*3/uL (ref 0.00–0.07)
Basophils Absolute: 0 10*3/uL (ref 0.0–0.1)
Basophils Relative: 0 %
Eosinophils Absolute: 0 10*3/uL (ref 0.0–0.5)
Eosinophils Relative: 0 %
HCT: 31.7 % — ABNORMAL LOW (ref 36.0–46.0)
Hemoglobin: 9.9 g/dL — ABNORMAL LOW (ref 12.0–15.0)
Immature Granulocytes: 0 %
Lymphocytes Relative: 30 %
Lymphs Abs: 1.5 10*3/uL (ref 0.7–4.0)
MCH: 27 pg (ref 26.0–34.0)
MCHC: 31.2 g/dL (ref 30.0–36.0)
MCV: 86.6 fL (ref 80.0–100.0)
Monocytes Absolute: 0.4 10*3/uL (ref 0.1–1.0)
Monocytes Relative: 9 %
Neutro Abs: 2.9 10*3/uL (ref 1.7–7.7)
Neutrophils Relative %: 61 %
Platelets: 184 10*3/uL (ref 150–400)
RBC: 3.66 MIL/uL — ABNORMAL LOW (ref 3.87–5.11)
RDW: 18.2 % — ABNORMAL HIGH (ref 11.5–15.5)
WBC: 4.8 10*3/uL (ref 4.0–10.5)
nRBC: 0 % (ref 0.0–0.2)

## 2018-11-08 LAB — BASIC METABOLIC PANEL
Anion gap: 7 (ref 5–15)
BUN: 37 mg/dL — ABNORMAL HIGH (ref 8–23)
CO2: 23 mmol/L (ref 22–32)
Calcium: 8.3 mg/dL — ABNORMAL LOW (ref 8.9–10.3)
Chloride: 102 mmol/L (ref 98–111)
Creatinine, Ser: 1.33 mg/dL — ABNORMAL HIGH (ref 0.44–1.00)
GFR calc Af Amer: 48 mL/min — ABNORMAL LOW (ref >60)
GFR calc non Af Amer: 42 mL/min — ABNORMAL LOW (ref >60)
Glucose, Bld: 444 mg/dL — ABNORMAL HIGH (ref 70–99)
Potassium: 4.3 mmol/L (ref 3.5–5.1)
Sodium: 132 mmol/L — ABNORMAL LOW (ref 135–145)

## 2018-11-08 LAB — CBG MONITORING, ED
Glucose-Capillary: 572 mg/dL (ref 70–99)
Glucose-Capillary: 338 mg/dL — ABNORMAL HIGH (ref 70–99)

## 2018-11-08 MED ORDER — SODIUM CHLORIDE 0.9 % IV BOLUS
500.0000 mL | Freq: Once | INTRAVENOUS | Status: AC
  Administered 2018-11-08: 500 mL via INTRAVENOUS

## 2018-11-08 MED ORDER — INSULIN ASPART 100 UNIT/ML ~~LOC~~ SOLN
8.0000 [IU] | Freq: Once | SUBCUTANEOUS | Status: AC
  Administered 2018-11-08: 8 [IU] via SUBCUTANEOUS
  Filled 2018-11-08: qty 0.08

## 2018-11-08 NOTE — ED Notes (Signed)
Patient ambulated to the bathroom w/ a steady gate. 

## 2018-11-08 NOTE — Discharge Instructions (Addendum)
Continue with your medications as prescribed. Follow up with your doctor if high blood sugar persists, return to ER for any concerning symptoms.

## 2018-11-08 NOTE — ED Triage Notes (Signed)
Patient arrived by EMS from home. Pt c/o dizziness x 2 days. Patient reports hyperglycemia.   Pt is c/o chronic eye pain.   Pt is alert and oriented x 4.   Pt stated she received a CT scan of the throat yesterday.

## 2018-11-08 NOTE — ED Provider Notes (Signed)
[facility] DEPT Provider Note   CSN: [id number] Arrival date & time: 11/08/18  1552     History   Chief Complaint Chief Complaint  Patient presents with  . Hyperglycemia    HPI [name] is a 68 y.o. female.     68yo female with history of insulin dependent diabetes presents with elevated blood surgar, CBG has been high for the past 3 months. Patient was seen in the ER yesterday, diagnosed with throat mass and referred to ENT. Patient reports CBG last night 103, was talking to a friend today about the friend's high blood sugar which prompted her to check her own blood sugar which was "high." Reports occasional blurry vision which is common when her blood sugar was elevated. Last took a steroid a few months ago (08/2018), none recently. Last A1C was 9, previously 6-7. Has not eaten breakfast, did take her meds this morning- took glipizide and 22 units of her insulin at 8:40AM just prior to coming to the ER (normally takes 10 when her blood sugar is high). Patient is otherwise feeling well, would not have come today except for the high reading on her meter.     Past Medical History:  Diagnosis Date  . ANEMIA, CHRONIC DISEASE NEC 03/15/2006  . Carotid artery occlusion   . Cirrhosis ([place])   . Colon polyps   . Constipation due to opioid therapy 06/05/2014  . DEGENERATIVE JOINT DISEASE 05/06/2007   On going for >5 yrs No imaging to confirm Has tried ultram, mobic, neurontin which did not work Ambulance person tried once worked well    . Diabetes mellitus    Type II  . DIABETIC  RETINOPATHY 03/15/2006  . 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 Diastolic dysfunction 09/25/2334   Grade 1 by Echocardiogram 12/13/14  . Elevated liver enzymes   . Heart murmur   . Hemorrhoids   . History of kidney stones   . Hyperlipidemia 04/01/2011  . Hypertension   . Mild AI (aortic insufficiency)   . Moderate aortic regurgitation 09/25/2008   12/13/14 Echocardiogram    .  Myalgia   . PAD (peripheral artery disease) ([place])   . PANCREATITIS, CHRONIC 03/29/2006  . Peripheral arterial disease ([place])   . Pneumonia    age 9-20  . RENAL INSUFFICIENCY, CHRONIC 03/15/2006  . Tonsillar mass    Left    Patient Active Problem List   Diagnosis Date Noted  . Critical lower limb ischemia 08/08/2018  . Diabetes mellitus type 2 in obese ([place]) 05/27/2018  . Acute on chronic pancreatitis ([place])   . Acute pancreatitis 05/26/2018  . Midfoot ulceration, right, with unspecified severity ([place]) 04/05/2018  . Acute pancreatitis with uninfected necrosis 04/01/2018  . Constipation 04/01/2018  . Recurrent epistaxis 05/31/2017  . Throat pain in adult 05/31/2017  . Tubular adenoma of colon 06/30/2016  . Diverticulosis of colon without hemorrhage 06/30/2016  . Postmenopausal vaginal bleeding   . Long term (current) use of opiate analgesic 04/09/2016  . Bilateral carotid artery disease ([place]) 11/28/2015  . Pancreatic insufficiency 05/09/2015  . 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 Risk for coronary artery disease greater than 20% in next 10 years 03/14/2015  . Diastolic dysfunction 02/15/4974  . Vulvar cyst 01/23/2015  . Abnormal uterine bleeding 12/02/2014  . Unexplained night sweats 12/02/2014  . Constipation due to opioid therapy 06/05/2014  . Loss of weight 03/29/2014  . Peripheral arterial disease ([place]) 12/12/2013  . GERD (gastroesophageal reflux disease) 10/12/2012  .  Healthcare maintenance 09/29/2012  . Back pain 03/28/2012  . Hyperlipidemia 04/01/2011  . BOILS, RECURRENT 03/13/2010  . Moderate aortic regurgitation 09/25/2008  . HEMORRHOIDS 06/08/2008  . Osteoarthritis 05/06/2007  . Former tobacco use 09/27/2006  . History of osteoporosis 06/29/2006  . Chronic pancreatitis ([place]) 03/29/2006  . Uncontrolled type 2 diabetes mellitus with chronic kidney disease, without long-term current use of insulin ([place]) 03/15/2006  . Diabetic neuropathy, painful ([place])  03/15/2006  . Anemia of chronic disease 03/15/2006  . Essential hypertension 03/15/2006  . VENTRICULAR HYPERTROPHY, LEFT 03/15/2006  . CKD stage 3 due to type 2 diabetes mellitus ([place]) 03/15/2006    Past Surgical History:  Procedure Laterality Date  . BREAST SURGERY    . COLONOSCOPY    . DILATION AND CURETTAGE OF UTERUS N/A 06/16/2016   Procedure: DILATATION AND CURETTAGE;  Surgeon: Mora Bellman, MD;  Location: [facility];  Service: Gynecology;  Laterality: N/A;  . HYSTEROSCOPY N/A 06/16/2016   Procedure: HYSTEROSCOPY;  Surgeon: Mora Bellman, MD;  Location: [facility];  Service: Gynecology;  Laterality: N/A;  . LOWER EXTREMITY ANGIOGRAPHY N/A 08/09/2018   Procedure: LOWER EXTREMITY ANGIOGRAPHY;  Surgeon: Adrian Prows, MD;  Location: [facility];  Service: Cardiovascular;  Laterality: N/A;  . MOUTH SURGERY N/A   . POLYPECTOMY N/A 06/16/2016   Procedure: POLYPECTOMY;  Surgeon: Mora Bellman, MD;  Location: [facility];  Service: Gynecology;  Laterality: N/A;     OB History    Gravida  3   Para  3   Term  3   Preterm      AB      Living  3     SAB      TAB      Ectopic      Multiple      Live Births  3            Home Medications    Prior to Admission medications   Medication Sig Start Date End Date Taking? Authorizing Provider  amLODipine (NORVASC) 10 MG tablet Take 10 mg by mouth daily. 06/01/17  Yes [provider]  aspirin EC 81 MG tablet Take 1 tablet (81 mg total) by mouth daily. 04/09/16  Yes Lucious Groves, DO  LINZESS 145 MCG CAPS capsule Take 145 mcg by mouth daily.  03/07/17  Yes [provider]  omeprazole (PRILOSEC) 40 MG capsule Take 40 mg by mouth daily. 04/16/18  Yes [provider]  Pancrelipase, Lip-Prot-Amyl, 24000-76000 units CPEP Take 4 capsules (96,000 Units total) by mouth See admin instructions. Take 4 capsules by mouth 2-3 times daily before meals Patient taking differently: Take 2-4 capsules by mouth See admin  instructions. Take 4 capsules by mouth two to three times a day with meals and 2 capsules with any snacks 03/10/16  Yes Lucious Groves, DO  PROAIR HFA 108 (90 Base) MCG/ACT inhaler Inhale 2 puffs into the lungs 4 (four) times daily as needed for wheezing or shortness of breath. 04/30/17  Yes [provider]  rivaroxaban (XARELTO) 2.5 MG TABS tablet Take 1 tablet (2.5 mg total) by mouth 2 (two) times daily. 08/19/18  Yes Miquel Dunn, NP  rosuvastatin (CRESTOR) 20 MG tablet TAKE 1 TABLET BY MOUTH EVERY DAY Patient taking differently: Take 20 mg by mouth daily.  04/04/18  Yes Miquel Dunn, NP  sodium chloride (MURO 128) 5 % ophthalmic solution  08/31/18  Yes [provider]  TRESIBA FLEXTOUCH 100 UNIT/ML SOPN FlexTouch Pen Inject 20  Units into the skin every morning.  05/17/18  Yes [provider]  [place] test strip TEST three times a day 11/25/15   Lucious Groves, DO  Blood Glucose Monitoring Suppl (ACCU-CHEK AVIVA PLUS) w/Device KIT Use to check you blood sugar 3 times a day 06/04/15   Oval Linsey, MD  pregabalin (LYRICA) 75 MG capsule Take 1 capsule (75 mg total) by mouth daily. Patient not taking: Reported on 11/08/2018 08/04/18   Trula Slade, DPM  silver sulfADIAZINE (SILVADENE) 1 % cream Apply pea-sized amount to wound daily. Patient not taking: Reported on 11/08/2018 09/08/18   Evelina Bucy, DPM  sucralfate (CARAFATE) 1 g tablet Take 1 tablet (1 g total) by mouth 3 (three) times daily after meals. Patient not taking: Reported on 11/08/2018 04/22/18   Pyrtle, Lajuan Lines, MD    Family History Family History  Problem Relation Age of Onset  . Dementia Mother   . Colon cancer Neg Hx     Social History Social History   Tobacco Use  . Smoking status: Former Smoker    Packs/day: 0.30    Years: 44.00    Pack years: 13.20    Types: Cigarettes    Quit date: 03/2017    Years since quitting: 1.6  . Smokeless tobacco: Never Used  .  Tobacco comment: started smoking in Nov 2019 few cigarettes a day  Substance Use Topics  . Alcohol use: Not Currently    Alcohol/week: 0.0 standard drinks  . Drug use: No     Allergies   Atorvastatin, Gabapentin, Contrast media [iodinated diagnostic agents], and Nicorette [nicotine]   Review of Systems Review of Systems  Constitutional: Negative for fever.  Respiratory: Negative for shortness of breath.   Cardiovascular: Negative for chest pain.  Gastrointestinal: Negative for nausea and vomiting.  Endocrine: Positive for polydipsia and polyuria.  Genitourinary: Positive for frequency. Negative for dysuria.  Musculoskeletal: Negative for arthralgias and myalgias.  Skin: Negative for rash and wound.  Allergic/Immunologic: Positive for immunocompromised state.  Neurological: Negative for dizziness and weakness.  All other systems reviewed and are negative.    Physical Exam Updated Vital Signs BP 123/87   Pulse 82   Temp 98.5 F (36.9 C) (Oral)   Resp (!) 21   Wt 59 kg   LMP 02/22/2016 (LMP Unknown)   SpO2 99%   BMI 21.64 kg/m   Physical Exam Vitals signs and nursing note reviewed.  Constitutional:      General: She is not in acute distress.    Appearance: She is well-developed. She is not diaphoretic.  HENT:     Head: Normocephalic and atraumatic.  Cardiovascular:     Rate and Rhythm: Normal rate and regular rhythm.     Pulses: Normal pulses.     Heart sounds: Murmur present.  Pulmonary:     Effort: Pulmonary effort is normal.     Breath sounds: Normal breath sounds.  Abdominal:     Palpations: Abdomen is soft.     Tenderness: There is no abdominal tenderness.  Musculoskeletal:     Right lower leg: No edema.     Left lower leg: No edema.  Skin:    General: Skin is warm and dry.  Neurological:     Mental Status: She is alert and oriented to person, place, and time.  Psychiatric:        Behavior: Behavior normal.      ED Treatments / Results  Labs  (all labs ordered  are listed, but only abnormal results are displayed) Labs Reviewed  CBC WITH DIFFERENTIAL/PLATELET - Abnormal; Notable for the following components:      Result Value   RBC 3.66 (*)    Hemoglobin 9.9 (*)    HCT 31.7 (*)    RDW 18.2 (*)    All other components within normal limits  URINALYSIS, ROUTINE W REFLEX MICROSCOPIC - Abnormal; Notable for the following components:   Color, Urine STRAW (*)    Glucose, UA >=500 (*)    All other components within normal limits  BASIC METABOLIC PANEL - Abnormal; Notable for the following components:   Sodium 132 (*)    Glucose, Bld 444 (*)    BUN 37 (*)    Creatinine, Ser 1.33 (*)    Calcium 8.3 (*)    GFR calc non Af Amer 42 (*)    GFR calc Af Amer 48 (*)    All other components within normal limits  CBG MONITORING, ED - Abnormal; Notable for the following components:   Glucose-Capillary 572 (*)    All other components within normal limits  CBG MONITORING, ED - Abnormal; Notable for the following components:   Glucose-Capillary 338 (*)    All other components within normal limits  CBG MONITORING, ED  CBG MONITORING, ED    EKG None  Radiology Ct Soft Tissue Neck W Contrast  Result Date: 11/07/2018 CLINICAL DATA:  Sore throat for 1 year, worse in the past 2 weeks. Difficulty swallowing. History of smoking. EXAM: CT NECK WITH CONTRAST TECHNIQUE: Multidetector CT imaging of the neck was performed using the standard protocol following the bolus administration of intravenous contrast. CONTRAST:  91m OMNIPAQUE IOHEXOL 300 MG/ML  SOLN COMPARISON:  06/10/2017 FINDINGS: Pharynx and larynx: There is a new enhancing mass in the left lateral and posterior aspects of the superior oropharynx measuring approximately 2.6 cm. The airway is patent. Subtle right-sided glottic asymmetry on the prior CT is less apparent today, and a discrete laryngeal mass is not identified. Salivary glands: No inflammation, mass, or stone. Thyroid: Scattered  subcentimeter thyroid nodules as previously seen. Lymph nodes: 2 adjacent abnormal left level IIA lymph nodes with heterogeneous low-attenuation measure 32 x 15 mm and 9 x 8 mm. No enlarged or suspicious lymph nodes are identified in the right neck. Vascular: Normal variant aortic arch branching pattern with common origin of the brachiocephalic and left common carotid arteries. Major vascular structures of the neck are patent with atherosclerotic plaque at both carotid bifurcations not resulting in flow limiting stenosis. The left vertebral artery is hypoplastic. Limited intracranial: Unremarkable. Visualized orbits: Bilateral cataract extraction. Mastoids and visualized paranasal sinuses: Clear. Skeleton: Advanced disc degeneration in the included upper thoracic spine with disc space narrowing, degenerative endplate sclerosis, and Schmorl's node formation, progressed from the prior study. Mild disc degeneration in the cervical spine. Upper chest: Mild motion artifact in the lung apices without consolidation or mass. Mild centrilobular emphysema. 3 mm subpleural nodule laterally in the right upper lobe (series 6, image 100), also present on the prior study and unlikely to be malignant. Other: None. IMPRESSION: New 2.6 cm left superior oropharyngeal mass and left level II lymphadenopathy consistent with metastatic squamous cell carcinoma. Aortic Atherosclerosis (ICD10-I70.0) and Emphysema (ICD10-J43.9). Electronically Signed   By: ALogan BoresM.D.   On: 11/07/2018 17:32    Procedures Procedures (including critical care time)  Medications Ordered in ED Medications  sodium chloride 0.9 % bolus 500 mL (0 mLs Intravenous Stopped 11/08/18 1314)  insulin aspart (novoLOG) injection 8 Units (8 Units Subcutaneous Given 11/08/18 1131)     Initial Impression / Assessment and Plan / ED Course  I have reviewed the triage vital signs and the nursing notes.  Pertinent labs & imaging results that were available during  my care of the patient were reviewed by me and considered in my medical decision making (see chart for details).  Clinical Course as of Nov 08 1338  Tue Nov 08, 1059  [id number] 68 year old female with history of insulin-dependent diabetes presents with complaint of high blood sugar reading at home.  Patient took 22 units of her insulin at home immediately before coming to the emergency room.  On arrival patient's blood glucose was found to be 572.  On recheck with lab work, glucose is 444.  Patient was given additional insulin and fluids, blood sugar improved to 338.  Patient's remaining lab work is in line with her previous labs.  Patient is ready for discharge home, will continue to monitor her blood sugar at home and take her medications as prescribed.   [LM]    Clinical Course User Index [LM] Tacy Learn, PA-C      Final Clinical Impressions(s) / ED Diagnoses   Final diagnoses:  Hyperglycemia    ED Discharge Orders    None       Roque Lias 11/08/18 1340    Lacretia Leigh, MD 11/08/18 [phone]

## 2018-11-15 ENCOUNTER — Other Ambulatory Visit: Payer: Self-pay

## 2018-11-15 ENCOUNTER — Ambulatory Visit (HOSPITAL_COMMUNITY)
Admission: RE | Admit: 2018-11-15 | Discharge: 2018-11-15 | Disposition: A | Discharge status: Home / Self Care | Payer: Medicare Other | Source: Ambulatory Visit | Attending: Nephrology | Admitting: Nephrology

## 2018-11-15 VITALS — BP 127/59 | HR 87 | Temp 97.3°F | Resp 18

## 2018-11-15 DIAGNOSIS — D638 Anemia in other chronic diseases classified elsewhere: Secondary | ICD-10-CM | POA: Diagnosis present

## 2018-11-15 LAB — POCT HEMOGLOBIN-HEMACUE: Hemoglobin: 10.6 g/dL — ABNORMAL LOW (ref 12.0–15.0)

## 2018-11-15 MED ORDER — EPOETIN ALFA 10000 UNIT/ML IJ SOLN
INTRAMUSCULAR | Status: AC
  Administered 2018-11-15: 10000 [IU] via SUBCUTANEOUS
  Filled 2018-11-15: qty 1

## 2018-11-15 MED ORDER — EPOETIN ALFA 10000 UNIT/ML IJ SOLN
10000.0000 [IU] | INTRAMUSCULAR | Status: DC
  Administered 2018-11-15: 14:00:00 10000 [IU] via SUBCUTANEOUS

## 2018-11-16 ENCOUNTER — Encounter: Payer: Self-pay | Admitting: Radiation Oncology

## 2018-11-16 NOTE — Progress Notes (Addendum)
Head and Neck Cancer Location of Tumor / Histology:  11/15/18 FNA  Patient presented with symptoms of: A sore throat to Dr. Blenda Nicely on 08/24/18. Dr. Blenda Nicely ordered a CT at the time which was not completed by the patient until she went to the ED on 11/07/18 for continued throat pain concerns.   Biopsies of  revealed:   Nutrition Status Yes No Comments  Weight changes? [x]  []  She reports some recent weight loss, but is unsure what it is related to.   Swallowing concerns? [x]  []  She reports pain "every now and then" to the left side of her throat.   PEG? []  [x]     Referrals Yes No Comments  Social Work? []  [x]    Dentistry? []  [x]    Swallowing therapy? []  [x]    Nutrition? []  [x]    Med/Onc? []  [x]     Safety Issues Yes No Comments  Prior radiation? []  [x]    Pacemaker/ICD? []  [x]    Possible current pregnancy? []  [x]    Is the patient on methotrexate? []  [x]     Tobacco/Marijuana/Snuff/ETOH use: She quit smoking in February 2018.   Past/Anticipated interventions by otolaryngology, if any:  11/15/18 Dr. Blenda Nicely  Plan:  Referral to Dr. Francina Ames at [facility] head neck cancer center for evaluation for possible tors We will go ahead and set her up for PET scan Fine-needle aspiration of the left neck mass obtained today, will call with results. We will add her to our head and neck tumor board to discuss there as well. I will refer her to radiation oncology as well.   --She will see him on 11/23/18 for consultation.   Past/Anticipated interventions by medical oncology, if any:  Not scheduled at time of note.     Current Complaints / other details:   11/25/18 PET -- She reports difficulty with transporation. I have referred her to the transportation coordinator. She voiced her appreciation for this service.

## 2018-11-17 ENCOUNTER — Other Ambulatory Visit (HOSPITAL_COMMUNITY): Payer: Self-pay | Admitting: Otolaryngology

## 2018-11-17 DIAGNOSIS — J358 Other chronic diseases of tonsils and adenoids: Secondary | ICD-10-CM

## 2018-11-17 DIAGNOSIS — C770 Secondary and unspecified malignant neoplasm of lymph nodes of head, face and neck: Secondary | ICD-10-CM

## 2018-11-18 ENCOUNTER — Telehealth: Payer: Self-pay | Admitting: Radiation Oncology

## 2018-11-18 NOTE — Telephone Encounter (Signed)
Confirmed appt and verified info. °

## 2018-11-21 ENCOUNTER — Encounter: Payer: Self-pay | Admitting: Radiation Oncology

## 2018-11-21 ENCOUNTER — Ambulatory Visit
Admission: RE | Admit: 2018-11-21 | Discharge: 2018-11-21 | Disposition: A | Discharge status: Home / Self Care | Payer: Medicare Other | Source: Ambulatory Visit | Attending: Radiation Oncology | Admitting: Radiation Oncology

## 2018-11-21 ENCOUNTER — Other Ambulatory Visit: Payer: Self-pay

## 2018-11-21 ENCOUNTER — Encounter: Payer: Self-pay | Admitting: *Deleted

## 2018-11-21 DIAGNOSIS — C140 Malignant neoplasm of pharynx, unspecified: Secondary | ICD-10-CM

## 2018-11-21 DIAGNOSIS — J392 Other diseases of pharynx: Secondary | ICD-10-CM

## 2018-11-21 NOTE — Progress Notes (Signed)
Radiation Oncology         [phone] ________________________________  Initial outpatient Consultation by telephone as patient was unable to access WebEx during pandemic precautions  Name: [name] MRN: [id number]  Date: 11/21/2018  DOB: [birth year]  [id number], Christean Grief, MD  Helayne Seminole, MD   REFERRING PHYSICIAN: Helayne Seminole, MD  DIAGNOSIS:    ICD-10-CM   1. 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 female who presented with a sore throat.  Subsequently, the patient saw Dr. Blenda Nicely who ordered a CT of the neck but this was not completed by the patient.  She ultimately presented to the emergency room on 11/07/2018 because she had continued throat pain..  CT of the neck on that date revealed a 2.6 cm left superior oropharyngeal mass and left level 2 lymphadenopathy suggestive of squamous cell carcinoma.  2 adjacent abnormal left level 2A lymph nodes were noted.  The larger one is 32 mm and the smaller one is 9 mm; no contralateral neck adenopathy.  I have personally reviewed her images.  PET scan is pending.      Biopsy of left neck mass on 11/15/2018 was suspicious for squamous cell carcinoma.  P 16 staining was negative.  Tissue biopsy is recommended to confirm the diagnosis as well as to confirm the P 16 status.   Swallowing issues, if any: none; eating well  Weight Changes: she states clothes are fitting the same, ~133lb is her baseline, and she feels she is maintaining that   Pain status: mild throat pain, no current ear pain.   She reports swelling in left neck.  Other symptoms: peripheral edema, waxes and wanes  Tobacco history, if any: She quit smoking in February 2018  Prior cancers, if any: none  She is edentulous, not claustrophobic  PREVIOUS RADIATION THERAPY: She believes she had radiation  therapy to her left eye many years ago for ?? Glaucoma ??, Dr. Zigmund Daniel.  She reports she had one treatment.  PAST MEDICAL HISTORY:  has a past medical history of ANEMIA, CHRONIC DISEASE NEC (03/15/2006), Carotid artery occlusion, Cirrhosis ([place]), Colon polyps, Constipation due to opioid therapy (06/05/2014), DEGENERATIVE JOINT DISEASE (05/06/2007), Diabetes mellitus, DIABETIC  RETINOPATHY (03/15/2006), DIABETIC PERIPHERAL NEUROPATHY (0/93/2355), Diastolic dysfunction (73/22/0254), Elevated liver enzymes, Heart murmur, Hemorrhoids, History of kidney stones, Hyperlipidemia (04/01/2011), Hypertension, Mild AI (aortic insufficiency), Moderate aortic regurgitation (09/25/2008), Myalgia, PAD (peripheral artery disease) ([place]), PANCREATITIS, CHRONIC (03/29/2006), Peripheral arterial disease ([place]), Pneumonia, RENAL INSUFFICIENCY, CHRONIC (03/15/2006), and Tonsillar mass.    PAST SURGICAL HISTORY: Past Surgical History:  Procedure Laterality Date  . COLONOSCOPY    . DILATION AND CURETTAGE OF UTERUS N/A 06/16/2016   Procedure: DILATATION AND CURETTAGE;  Surgeon: Mora Bellman, MD;  Location: [facility];  Service: Gynecology;  Laterality: N/A;  . HYSTEROSCOPY N/A 06/16/2016   Procedure: HYSTEROSCOPY;  Surgeon: Mora Bellman, MD;  Location: [facility];  Service: Gynecology;  Laterality: N/A;  . LOWER EXTREMITY ANGIOGRAPHY N/A 08/09/2018   Procedure: LOWER EXTREMITY ANGIOGRAPHY;  Surgeon: Adrian Prows, MD;  Location: [facility];  Service: Cardiovascular;  Laterality: N/A;  . MOUTH SURGERY N/A   . POLYPECTOMY N/A 06/16/2016   Procedure: POLYPECTOMY;  Surgeon: Mora Bellman, MD;  Location: [facility];  Service: Gynecology;  Laterality: N/A;    FAMILY HISTORY: family history includes Dementia in her mother.  SOCIAL  HISTORY:  reports that she quit smoking about 2 years ago. Her smoking use included cigarettes. She has a 13.20 pack-year smoking history. She has never used smokeless tobacco. She reports previous alcohol use. She  reports that she does not use drugs.  ALLERGIES: Atorvastatin, Gabapentin, Contrast media [iodinated diagnostic agents], and Nicorette [nicotine]  MEDICATIONS:  Current Outpatient Medications  Medication Sig Dispense Refill  . ACCU-CHEK AVIVA PLUS test strip TEST three times a day 100 each 11  . amLODipine (NORVASC) 10 MG tablet Take 10 mg by mouth daily.    Marland Kitchen aspirin EC 81 MG tablet Take 1 tablet (81 mg total) by mouth daily.    . Blood Glucose Monitoring Suppl (ACCU-CHEK AVIVA PLUS) w/Device KIT Use to check you blood sugar 3 times a day 1 kit 1  . LINZESS 145 MCG CAPS capsule Take 145 mcg by mouth daily.     Marland Kitchen omeprazole (PRILOSEC) 40 MG capsule Take 40 mg by mouth daily.    . Pancrelipase, Lip-Prot-Amyl, 24000-76000 units CPEP Take 4 capsules (96,000 Units total) by mouth See admin instructions. Take 4 capsules by mouth 2-3 times daily before meals (Patient taking differently: Take 2-4 capsules by mouth See admin instructions. Take 4 capsules by mouth two to three times a day with meals and 2 capsules with any snacks) 1170 capsule 3  . PROAIR HFA 108 (90 Base) MCG/ACT inhaler Inhale 2 puffs into the lungs 4 (four) times daily as needed for wheezing or shortness of breath.  3  . rivaroxaban (XARELTO) 2.5 MG TABS tablet Take 1 tablet (2.5 mg total) by mouth 2 (two) times daily. 60 tablet 1  . rosuvastatin (CRESTOR) 20 MG tablet TAKE 1 TABLET BY MOUTH EVERY DAY (Patient taking differently: Take 20 mg by mouth daily. ) 90 tablet 3  . sodium chloride (MURO 128) 5 % ophthalmic solution     . TRESIBA FLEXTOUCH 100 UNIT/ML SOPN FlexTouch Pen Inject 20 Units into the skin every morning.     . pregabalin (LYRICA) 75 MG capsule Take 1 capsule (75 mg total) by mouth daily. (Patient not taking: Reported on 11/21/2018) 60 capsule 0  . silver sulfADIAZINE (SILVADENE) 1 % cream Apply pea-sized amount to wound daily. (Patient not taking: Reported on 11/08/2018) 50 g 0  . sucralfate (CARAFATE) 1 g tablet Take 1  tablet (1 g total) by mouth 3 (three) times daily after meals. (Patient not taking: Reported on 11/08/2018) 60 tablet 0   No current facility-administered medications for this encounter.     REVIEW OF SYSTEMS:  Notable for that above.   PHYSICAL EXAM:  vitals were not taken for this visit.   General: Alert and oriented, in no acute distress HEENT: Head is normocephalic. Extraocular movements Psychiatric: Judgment and insight are intact. Affect is appropriate.    LABORATORY DATA:  Lab Results  Component Value Date   WBC 4.8 11/08/2018   HGB 10.6 (L) 11/15/2018   HCT 31.7 (L) 11/08/2018   MCV 86.6 11/08/2018   PLT 184 11/08/2018   CMP     Component Value Date/Time   NA 132 (L) 11/08/2018 1025   NA 136 08/02/2018 1347   K 4.3 11/08/2018 1025   CL 102 11/08/2018 1025   CO2 23 11/08/2018 1025   GLUCOSE 444 (H) 11/08/2018 1025   BUN 37 (H) 11/08/2018 1025   BUN 35 (H) 08/02/2018 1347   CREATININE 1.33 (H) 11/08/2018 1025   CREATININE 1.35 (H) 05/03/2014 1522   CALCIUM 8.3 (L) 11/08/2018  1025   PROT 6.8 07/31/2018 2057   ALBUMIN 2.5 (L) 07/31/2018 2057   AST 59 (H) 07/31/2018 2057   ALT 55 (H) 07/31/2018 2057   ALKPHOS 100 07/31/2018 2057   BILITOT 0.5 07/31/2018 2057   GFRNONAA 42 (L) 11/08/2018 1025   GFRNONAA 42 (L) 05/03/2014 1522   GFRAA 48 (L) 11/08/2018 1025   GFRAA 49 (L) 05/03/2014 1522      Lab Results  Component Value Date   TSH 1.539 03/29/2014     RADIOGRAPHY: as above     IMPRESSION/PLAN:  This is a delightful patient with head and neck cancer. Pending PET and further biopsy. She is exploring options for surgery in the near future with Dr. Nicolette Bang.  If she undergoes surgery there is a decent likelihood that adjuvant radiotherapy will be recommended.  If she does not undergo surgery then she needs to be considered more seriously for chemoradiotherapy.   We discussed the potential risks, benefits, and side effects of radiotherapy. We talked in detail  about acute and late effects. We discussed that some of the most bothersome acute effects may be mucositis, dysgeusia, salivary changes, skin irritation, hair loss, dehydration, weight loss and fatigue. We talked about late effects which include but are not necessarily limited to dysphagia, hypothyroidism, nerve injury, spinal cord injury, xerostomia, trismus, and neck edema. No guarantees of treatment were given. The patient is enthusiastic about proceeding with treatment as needed. I look forward to participating in the patient's care.    We also discussed that the treatment of head and neck cancer is a multidisciplinary process to maximize treatment outcomes and quality of life. For this reason the following referrals have been or will be made:   Medical oncology to discuss chemotherapy    Dentistry for dental evaluation, possible extractions in the radiation fields, and /or advice on reducing risk of cavities, osteoradionecrosis, or other oral issues. (Our dentist recommends that edentulous patient see him when able in case they have retained tooth roots)   Nutritionist for nutrition support during and after treatment.   Speech language pathology for swallowing and/or speech therapy.   Social work for social support.    Physical therapy due to risk of lymphedema in neck and deconditioning.   Baseline labs including TSH.  This encounter was provided by telemedicine platform telephone as patient was unable to access WebEx during pandemic precautionsThe patient has given verbal consent for this type of encounter and has been advised to only accept a meeting of this type in a secure network environment. The time spent during this encounter was 30 minutes. The attendants for this meeting include Eppie Gibson  and [employer]. And Gayleen Orem, RN, our Head and Neck Oncology Navigator During the encounter, Eppie Gibson was located at [facility] Radiation Oncology Department.   [name] was located at home.  __________________________________________   Eppie Gibson, MD

## 2018-11-21 NOTE — Progress Notes (Signed)
Oncology Nurse Navigator Documentation  Met with Ms. Folkes during tele-consult with Dr. Isidore Moos.      . Introduced myself as her Navigator, explained my role as a member of the Care Team; provided her my contact information.   . She confirmed her niece is providing transportation to this [facility] TORS consult with Dr. Nicolette Bang, [facility]. . She voiced understanding of Dr. Pearlie Oyster discussion of tmt options TORS followed by RT vs chemoRT. Marland Kitchen She voiced agreement to call me with questions/concerns as she proceeds with appts.   Navigator Initial Assessment Employment Status/FMLA/STD:  On disability.  Medicare/Medicaid Support System:  Dtr Varney Biles to soon move in with her while she has tmt.  Dtr Ivin Booty who is CNA available as able. PCP: Warren Danes PCD: No - endentulous Transportation Needs: Yes Sensory Deficits/Language Barriers/Interpreter Needed:  No Ambulation Needs: No DME Used in Home: No Psychosocial Needs:  No Concerns/Needs Understanding Cancer: No Self-Expressed Needs: No  Gayleen Orem, RN, BSN Head & Neck Oncology Nurse [facility] [phone]

## 2018-11-25 ENCOUNTER — Other Ambulatory Visit: Payer: Self-pay

## 2018-11-25 ENCOUNTER — Other Ambulatory Visit: Payer: Self-pay | Admitting: Radiation Oncology

## 2018-11-25 ENCOUNTER — Ambulatory Visit (HOSPITAL_COMMUNITY)
Admission: RE | Admit: 2018-11-25 | Discharge: 2018-11-25 | Disposition: A | Discharge status: Home / Self Care | Payer: Medicare Other | Source: Ambulatory Visit | Attending: Otolaryngology | Admitting: Otolaryngology

## 2018-11-25 ENCOUNTER — Encounter: Payer: Self-pay | Admitting: Radiation Oncology

## 2018-11-25 DIAGNOSIS — Z1329 Encounter for screening for other suspected endocrine disorder: Secondary | ICD-10-CM

## 2018-11-25 DIAGNOSIS — R5381 Other malaise: Secondary | ICD-10-CM

## 2018-11-25 DIAGNOSIS — Z79899 Other long term (current) drug therapy: Secondary | ICD-10-CM | POA: Diagnosis not present

## 2018-11-25 DIAGNOSIS — J358 Other chronic diseases of tonsils and adenoids: Secondary | ICD-10-CM | POA: Insufficient documentation

## 2018-11-25 DIAGNOSIS — C770 Secondary and unspecified malignant neoplasm of lymph nodes of head, face and neck: Secondary | ICD-10-CM | POA: Diagnosis not present

## 2018-11-25 DIAGNOSIS — C01 Malignant neoplasm of base of tongue: Secondary | ICD-10-CM

## 2018-11-25 DIAGNOSIS — C140 Malignant neoplasm of pharynx, unspecified: Secondary | ICD-10-CM

## 2018-11-25 DIAGNOSIS — J392 Other diseases of pharynx: Secondary | ICD-10-CM

## 2018-11-25 DIAGNOSIS — C099 Malignant neoplasm of tonsil, unspecified: Secondary | ICD-10-CM | POA: Insufficient documentation

## 2018-11-25 LAB — GLUCOSE, CAPILLARY: Glucose-Capillary: 169 mg/dL — ABNORMAL HIGH (ref 70–99)

## 2018-11-25 MED ORDER — FLUDEOXYGLUCOSE F - 18 (FDG) INJECTION
6.5000 | Freq: Once | INTRAVENOUS | Status: AC | PRN
  Administered 2018-11-25: 6.5 via INTRAVENOUS

## 2018-11-28 ENCOUNTER — Other Ambulatory Visit: Payer: Self-pay

## 2018-11-28 ENCOUNTER — Telehealth: Payer: Self-pay | Admitting: Radiation Oncology

## 2018-11-28 ENCOUNTER — Ambulatory Visit: Payer: Medicare Other | Attending: Radiation Oncology | Admitting: Rehabilitation

## 2018-11-28 ENCOUNTER — Encounter: Payer: Self-pay | Admitting: Rehabilitation

## 2018-11-28 DIAGNOSIS — R293 Abnormal posture: Secondary | ICD-10-CM | POA: Insufficient documentation

## 2018-11-28 DIAGNOSIS — R1310 Dysphagia, unspecified: Secondary | ICD-10-CM | POA: Insufficient documentation

## 2018-11-28 NOTE — Therapy (Signed)
[place], [place], [address] Phone: [phone]   Fax:  [phone]  Physical Therapy Evaluation  Patient Details  Name: [name] MRN: [id number] Date of Birth: August 08, 1950 Referring Provider (PT): Dr. Isidore Moos   Encounter Date: 11/28/2018  PT End of Session - 11/28/18 2117    Visit Number  1    Number of Visits  1    Authorization Type  Medicare    PT Start Time  1600    PT Stop Time  1633    PT Time Calculation (min)  33 min    Activity Tolerance  Patient tolerated treatment well    Behavior During Therapy  [facility] for tasks assessed/performed       Past Medical History:  Diagnosis Date  . ANEMIA, CHRONIC DISEASE NEC 03/15/2006  . Carotid artery occlusion   . Cirrhosis ([place])   . Colon polyps   . Constipation due to opioid therapy 06/05/2014  . DEGENERATIVE JOINT DISEASE 05/06/2007   On going for >5 yrs No imaging to confirm Has tried ultram, mobic, neurontin which did not work Ambulance person tried once worked well    . Diabetes mellitus    Type II  . DIABETIC  RETINOPATHY 03/15/2006  . 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 Diastolic dysfunction 99991111   Grade 1 by Echocardiogram 12/13/14  . Elevated liver enzymes   . Heart murmur   . Hemorrhoids   . History of kidney stones   . Hyperlipidemia 04/01/2011  . Hypertension   . Mild AI (aortic insufficiency)   . Moderate aortic regurgitation 09/25/2008   12/13/14 Echocardiogram    . Myalgia   . PAD (peripheral artery disease) ([place])   . PANCREATITIS, CHRONIC 03/29/2006  . Peripheral arterial disease ([place])   . Pneumonia    age [age]  . RENAL INSUFFICIENCY, CHRONIC 03/15/2006  . Tonsillar mass    Left    Past Surgical History:  Procedure Laterality Date  . COLONOSCOPY    . DILATION AND CURETTAGE OF UTERUS N/A 06/16/2016   Procedure: DILATATION AND CURETTAGE;  Surgeon: Mora Bellman, MD;  Location: [facility];  Service: Gynecology;   Laterality: N/A;  . HYSTEROSCOPY N/A 06/16/2016   Procedure: HYSTEROSCOPY;  Surgeon: Mora Bellman, MD;  Location: [facility];  Service: Gynecology;  Laterality: N/A;  . LOWER EXTREMITY ANGIOGRAPHY N/A 08/09/2018   Procedure: LOWER EXTREMITY ANGIOGRAPHY;  Surgeon: Adrian Prows, MD;  Location: [facility];  Service: Cardiovascular;  Laterality: N/A;  . MOUTH SURGERY N/A   . POLYPECTOMY N/A 06/16/2016   Procedure: POLYPECTOMY;  Surgeon: Mora Bellman, MD;  Location: [facility];  Service: Gynecology;  Laterality: N/A;    There were no vitals filed for this visit.   Subjective Assessment - 11/28/18 1559    Subjective  get information before surgery and radiation    Pertinent History  CT of the neck on that date revealed a 2.6 cm left superior oropharyngeal mass and left level 2 lymphadenopathy suggestive of squamous cell carcinoma. She is exploring options for surgery in the near future with Dr. Nicolette Bang at [facility].  If she undergoes surgery there is a decent likelihood that adjuvant radiotherapy will be recommended.  If she does not undergo surgery then she needs to be considered more seriously for chemoradiotherapy. Other history includes DM with neuropathy, CKD stage 3, HTN, OA, osteoporosis    Limitations  Other (comment)   reports none overall   Patient Stated Goals  learn  from providers    Currently in Pain?  No/denies         [facility] PT Assessment - 11/28/18 0001      Assessment   Medical Diagnosis  oropharyngeal mass    Referring Provider (PT)  Dr. Isidore Moos    Onset Date/Surgical Date  11/07/18    Hand Dominance  Right    Next MD Visit  tomorrow    Prior Therapy  no      Precautions   Precaution Comments  kidney disease, DM,      Restrictions   Weight Bearing Restrictions  No      Balance Screen   Has the patient fallen in the past 6 months  No    Has the patient had a decrease in activity level because of a fear of falling?   No    Is the patient reluctant to leave  their home because of a fear of falling?   No      Home Social worker  Private residence    Living Arrangements  Alone    Available Help at Discharge  Family      Prior Function   Level of Independence  Independent    Vocation  On disability    Leisure  walk and cleaning      Cognition   Overall Cognitive Status  Within Functional Limits for tasks assessed      Observation/Other Assessments   Observations  Tight UT and scalenes apparent      Sensation   Light Touch  Appears Intact      Coordination   Gross Motor Movements are Fluid and Coordinated  Yes      Posture/Postural Control   Posture/Postural Control  Postural limitations    Postural Limitations  Forward head;Rounded Shoulders      ROM / Strength   AROM / PROM / Strength  AROM;Strength      AROM   Overall AROM Comments  shoulder ROM screen equal between sides and painfree WNL for all    AROM Assessment Site  Shoulder;Cervical    Right/Left Shoulder  Right;Left    Cervical Flexion  50    Cervical Extension  46    Cervical - Right Side Bend  40    Cervical - Left Side Bend  40    Cervical - Right Rotation  63    Cervical - Left Rotation  70      Strength   Overall Strength Comments  all shoulder MMT 4+/5 bilateral and no pain      Ambulation/Gait   Gait Comments  gait WNL in clinic        LYMPHEDEMA/ONCOLOGY QUESTIONNAIRE - 11/28/18 1612      Type   Cancer Type  BOT      Lymphedema Assessments   Lymphedema Assessments  Head and Neck      Head and Neck   Right Lateral Nostril at base of nose to medial tragus   9.6 cm    Left Lateral Nostril at base of nose to medial tragus   9.5 cm    Right Corner of mouth to where ear lobe meets face  9.2 cm    Left Corner of mouth to where ear lobe meets face  9 cm    6 cm superior to sternal notch around neck  35 cm    8 cm superior to sternal notch around neck  35 cm  Objective measurements completed on examination: See  above findings.              PT Education - 11/28/18 2117    Education Details  Neck exercises for radiation, walking program, lymphedema education    Person(s) Educated  Patient    Methods  Explanation;Handout;Verbal cues    Comprehension  Verbalized understanding              Head and Neck Clinic Goals - 11/28/18 2122      Patient will be able to verbalize understanding of a home exercise program for cervical range of motion, posture, and walking.    Time  1    Period  Days    Status  Achieved      Patient will be able to verbalize understanding of proper sitting and standing posture.    Time  1    Period  Days    Status  Achieved      Patient will be able to verbalize understanding of lymphedema risk and availability of treatment for this condition.    Time  1    Period  Days    Status  Achieved         Plan - 11/28/18 2118    Clinical Impression Statement  Pt presents pre surgical consult and radiation/chemotherapy to obtain baselines and information about lymphedema and neck exercises.  Pt has a history of chronic neck pain and back pain with poor postural and significant tightness in the cervical muscles already so pt was educated with neck and postural exercises to perform to get ready for surgery/radiation and throughout.  Education on fatigue and walking as well as diaphragmatic breathing.  Pt aware that she can return with any lymphedema or ROM restructions during treatment.    Personal Factors and Comorbidities  Comorbidity 2    Comorbidities  CKD, uncontrolled DM    Stability/Clinical Decision Making  Stable/Uncomplicated    Clinical Decision Making  Low    Rehab Potential  Excellent    PT Frequency  One time visit    PT Treatment/Interventions  ADLs/Self Care Home Management    Consulted and Agree with Plan of Care  Patient       Patient will benefit from skilled therapeutic intervention in order to improve the following  deficits and impairments:  Increased fascial restricitons, Postural dysfunction  Visit Diagnosis: Abnormal posture     Problem List Patient Active Problem List   Diagnosis Date Noted  . Pharyngeal carcinoma ([place]) 11/25/2018  . Critical lower limb ischemia 08/08/2018  . Diabetes mellitus type 2 in obese ([place]) 05/27/2018  . Acute on chronic pancreatitis ([place])   . Acute pancreatitis 05/26/2018  . Midfoot ulceration, right, with unspecified severity ([place]) 04/05/2018  . Acute pancreatitis with uninfected necrosis 04/01/2018  . Constipation 04/01/2018  . Recurrent epistaxis 05/31/2017  . Throat pain in adult 05/31/2017  . Tubular adenoma of colon 06/30/2016  . Diverticulosis of colon without hemorrhage 06/30/2016  . Postmenopausal vaginal bleeding   . Long term (current) use of opiate analgesic 04/09/2016  . Bilateral carotid artery disease ([place]) 11/28/2015  . Pancreatic insufficiency 05/09/2015  . 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 Risk for coronary artery disease greater than 20% in next 10 years 03/14/2015  . Diastolic dysfunction 99991111  . Vulvar cyst 01/23/2015  . Abnormal uterine bleeding 12/02/2014  . Unexplained night sweats 12/02/2014  . Constipation due to opioid therapy 06/05/2014  . Loss of weight 03/29/2014  .  Peripheral arterial disease ([place]) 12/12/2013  . GERD (gastroesophageal reflux disease) 10/12/2012  . Healthcare maintenance 09/29/2012  . Back pain 03/28/2012  . Hyperlipidemia 04/01/2011  . BOILS, RECURRENT 03/13/2010  . Moderate aortic regurgitation 09/25/2008  . HEMORRHOIDS 06/08/2008  . Osteoarthritis 05/06/2007  . Former tobacco use 09/27/2006  . History of osteoporosis 06/29/2006  . Chronic pancreatitis ([place]) 03/29/2006  . Uncontrolled type 2 diabetes mellitus with chronic kidney disease, without long-term current use of insulin ([place]) 03/15/2006  . Diabetic neuropathy, painful ([place]) 03/15/2006  . Anemia of chronic disease 03/15/2006   . Essential hypertension 03/15/2006  . VENTRICULAR HYPERTROPHY, LEFT 03/15/2006  . CKD stage 3 due to type 2 diabetes mellitus ([place]) 03/15/2006    Stark Bray 11/28/2018, 9:24 PM  [place], [place], [address] Phone: [phone]   Fax:  [phone]  Name: GEORGINA ORMONDE MRN: [id number] Date of Birth: 11-Jul-1950

## 2018-11-28 NOTE — Telephone Encounter (Signed)
Scheduled appt per 10/5 sch message - unable to reach pt and unable to leave message. Mailed letter with appt date and time

## 2018-11-28 NOTE — Patient Instructions (Signed)
Neck exercises for radiation, walking program, lymphedema education

## 2018-11-29 ENCOUNTER — Telehealth: Payer: Self-pay | Admitting: Hematology

## 2018-11-29 ENCOUNTER — Ambulatory Visit (HOSPITAL_COMMUNITY)
Admission: RE | Admit: 2018-11-29 | Discharge: 2018-11-29 | Disposition: A | Discharge status: Home / Self Care | Payer: Medicare Other | Source: Ambulatory Visit | Attending: Nephrology | Admitting: Nephrology

## 2018-11-29 VITALS — BP 123/55 | HR 87 | Resp 20

## 2018-11-29 DIAGNOSIS — D638 Anemia in other chronic diseases classified elsewhere: Secondary | ICD-10-CM | POA: Diagnosis not present

## 2018-11-29 LAB — IRON AND TIBC
Iron: 44 ug/dL (ref 28–170)
Saturation Ratios: 16 % (ref 10.4–31.8)
TIBC: 281 ug/dL (ref 250–450)
UIBC: 237 ug/dL

## 2018-11-29 LAB — POCT HEMOGLOBIN-HEMACUE: Hemoglobin: 10.7 g/dL — ABNORMAL LOW (ref 12.0–15.0)

## 2018-11-29 LAB — FERRITIN: Ferritin: 62 ng/mL (ref 11–307)

## 2018-11-29 MED ORDER — EPOETIN ALFA 10000 UNIT/ML IJ SOLN
INTRAMUSCULAR | Status: AC
  Administered 2018-11-29: 10000 [IU] via SUBCUTANEOUS
  Filled 2018-11-29: qty 1

## 2018-11-29 MED ORDER — EPOETIN ALFA 10000 UNIT/ML IJ SOLN
10000.0000 [IU] | INTRAMUSCULAR | Status: DC
  Administered 2018-11-29: 14:00:00 10000 [IU] via SUBCUTANEOUS

## 2018-11-29 NOTE — Telephone Encounter (Signed)
Attempted to contact patient on several occasions to inform of new patient appt date/time/location 10/15 at 8:30 am. Mailed appointment letter and calender to patient

## 2018-11-30 NOTE — Progress Notes (Signed)
[facility] CONSULT NOTE  Patient Care Team: Nolene Ebbs, MD as PCP - General (Internal Medicine) Fredderick Erb, MD (Ophthalmology) Jola Schmidt, MD as Consulting Physician (Ophthalmology) Eppie Gibson, MD as Attending Physician (Radiation Oncology) Leota Sauers, RN as Oncology Nurse Navigator (Oncology)  HEME/ONC OVERVIEW: 1. 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 FDG-avid L thyroid gland -Noted incidentally on PET in 11/2018   TREATMENT REGIMEN:  12/15/2018 - present: definitive chemoRT with weekly carboplatin (tentatively)  PERTINENT NON-HEM/ONC PROBLEMS: 1. 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 Blenda Nicely for further evaluation, but she had multiple no-shows to her ENT appts, including CT neck. She presented to [facility] ER in mid-10/2018 for persistent throat pain, and CT neck showed of 2.6 cm left tonsil was managed with left Level II LN's (largest measuring approximately 3 cm).  She underwent FNA of the left cervical lymph node by Dr. Blenda Nicely, which showed atypical cells suspicious for squamous cell carcinoma, p16-.  PET in 11/2008 showed FDG-avid left tonsil malignancy with  ipsilateral cervical lymph node involvement.  There was also an incidental lesion in the left thyroid gland that was FDG-avid.  There was no evidence of metastatic disease.  She was referred to Dr. Nicolette Bang of ENT for consideration of TORS, who felt that the patient was not a good candidate for surgery due to significant potential morbidities from the surgery.  Patient was referred to oncology for discussion of chemotherapy. -I reviewed the imaging and pathology results in detail with the patient -I also reviewed NCCN guidelines in detail with the patient -Due to the patient not being a candidate for TORS, the standard-of-care approach is definitive chemoradiation -Review of her renal function showed at least Stage III CKD dating back to 2016; therefore, the chemotherapy of choice would be carboplatin  -We discussed some of the risks, benefits and side-effects of carboplatin concurrent with radiation.  The regimen will be weekly carboplatin AUC 2 x 7 doses.   -Some of the short term side-effects included, though not limited to, risk of fatigue, weight loss, tumor lysis syndrome, risk of allergic reactions, pancytopenia, life-threatening infections, need for transfusions of blood products, nausea, vomiting, change in bowel habits, admission to hospital for various reasons, and risks of death.  -Long term side-effects are also discussed including permanent damage to nerve function, chronic fatigue, and rare secondary malignancy including bone marrow disorders.  -The patient is aware that the response rates discussed earlier is not guaranteed.   -After a long discussion, patient made an informed decision to proceed with the prescribed plan of care.  -In anticipation of chemoradiation, I have ordered port and PEG tube placement -I have also prescribed PRN anti-emetics, including Zofran, Compazine, dexamethasone and  Ativan -Tentative date to start chemotherapy on 12/15/2018   Cancer-related pain -Secondary  to left tonsil malignancy -Okay to try OTC Tylenol for now, but she may need opioid medication once she starts treatment   Normocytic anemia -Possibly due to underlying CKD -Hgb baseline ~10; no evidence of bleeding or malignancy on colonoscopy in 2018  -Hgb 11.1 today, stable  -I have ordered iron profile today  -We will monitor it closely    Stage III CKD -Likely secondary to underlying diabetes -Cr as high as 2.6 in 2016, stabilized ~1.3-1.4; Cr 1.26 today, stable  -Not eligible for cisplatin due to advanced CKD -I counseled the patient on the importance of avoiding nephrotoxic medication, including NSAIDs -We will monitor renal function closely during treatment  Hx of EtOH and tobacco abuse -Patient has hx of heavy EtOH and tobacco abuse, which she quit in 2018 -I counseled the patient on the importance of abstinence from EtOH and tobacco products   Orders Placed This Encounter  Procedures  . IR IMAGING GUIDED PORT INSERTION    Standing Status:   Future    Standing Expiration Date:   02/04/2020    Order Specific Question:   Reason for Exam (SYMPTOM  OR DIAGNOSIS REQUIRED)    Answer:   left tonsil, need chemo access    Order Specific Question:   Preferred Imaging Location?    Answer:   [place] TUBE MOD SED    Standing Status:   Future    Standing Expiration Date:   02/04/2020    Order Specific Question:   Reason for exam:    Answer:   left tonsil cancer, need enteral nutrition access    Order Specific Question:   Preferred Imaging Location?    Answer:   [facility]  . CBC with Differential ([place] Only)    Standing Status:   Standing    Number of Occurrences:   20    Standing Expiration Date:   12/05/2019  . Basic Metabolic Panel - [place] Only    Standing Status:   Standing    Number of Occurrences:   20    Standing Expiration Date:   12/05/2019  . Magnesium    Standing Status:   Standing    Number of Occurrences:    20    Standing Expiration Date:   12/05/2019   A total of more than 60 minutes were spent face-to-face with the patient during this encounter and over half of that time was spent on counseling and coordination of care as outlined above.    All questions were answered. The patient knows to call the clinic with any problems, questions or concerns.  Return on 12/14/2018 for labs, port flush and clinic appt prior to Cycle 1 of weekly carboplatin.   Tish Men, MD 12/05/2018 12:02 PM   CHIEF COMPLAINTS/PURPOSE OF CONSULTATION:  "My neck is a little sore"  HISTORY OF PRESENTING ILLNESS:  [name] 68 y.o. female is here because of newly diagnosed squamous cell carcinoma of the left tonsil.  Patient has had throat pain for at least 1 year, for which she was referred to Dr. Blenda Nicely for further evaluation, but she had multiple no-shows to her ENT appts, including CT neck. She presented to [facility] ER in mid-10/2018 for persistent throat pain, and CT neck showed of 2.6 cm left tonsil was managed with left Level II LN's (largest measuring approximately 3 cm).  She underwent FNA of  the left cervical lymph node by Dr. Blenda Nicely, which showed atypical cells suspicious for squamous cell carcinoma, p16-.  PET in 11/2008 showed FDG-avid left tonsil malignancy with ipsilateral cervical lymph node involvement.  There was also an incidental lesion in the left thyroid gland that was FDG-avid.  There was no evidence of metastatic disease.  She was referred to Dr. Nicolette Bang of ENT for consideration of TORS, who felt that the patient was not a good candidate for surgery due to significant potential morbidities from the surgery.  Patient was referred to oncology for discussion of chemotherapy.  Patient reports that since having left neck lymph node biopsy, she has had mild to moderate, constant, dull, pain in the left side of neck, occasionally radiating to the left ear.  She was told in the past that she could not take  any NSAIDs or Tylenol, and therefore she has not been taking any medication for pain.  She denies any weight change, dysphagia, or odynophagia.  She has a history of heavy EtOH abuse, which she quit in 2018, but she still has evidence of "chronic pancreatitis".  She quit smoking in 2018 as well.  She denies any other complaint today.  REVIEW OF SYSTEMS:   Constitutional: ( - ) fevers, ( - )  chills , ( - ) night sweats Eyes: ( - ) blurriness of vision, ( - ) double vision, ( - ) watery eyes Ears, nose, mouth, throat, and face: ( - ) mucositis, ( - ) sore throat Respiratory: ( - ) cough, ( - ) dyspnea, ( - ) wheezes Cardiovascular: ( - ) palpitation, ( - ) chest discomfort, ( - ) lower extremity swelling Gastrointestinal:  ( - ) nausea, ( - ) heartburn, ( - ) change in bowel habits Skin: ( - ) abnormal skin rashes Lymphatics: ( + ) lymphadenopathy, ( - ) easy bruising Neurological: ( - ) numbness, ( - ) tingling, ( - ) new weaknesses Behavioral/Psych: ( - ) mood change, ( - ) new changes  All other systems were reviewed with the patient and are negative.  I have reviewed her chart and materials related to her cancer extensively and collaborated history with the patient. Summary of oncologic history is as follows: Oncology History  Squamous cell carcinoma of left tonsil ([place])  11/08/2018 Imaging   CT neck: IMPRESSION: New 2.6 cm left superior oropharyngeal mass and left level II lymphadenopathy consistent with metastatic squamous cell carcinoma.   Aortic Atherosclerosis (ICD10-I70.0) and Emphysema (ICD10-J43.9).   11/15/2018 Pathology Results   Final Cytologic Interpretation Left neck mass, Fine Needle Aspiration I (smears and cell block):      Suspicious for squamous cell carcinoma.   11/25/2018 Initial Diagnosis   Squamous cell carcinoma of left tonsil ([place])   11/25/2018 Imaging   PET: IMPRESSION: 1. Hypermetabolic lesion in the posterior LEFT oropharynx appears confined to the  pharyngeal mucosal surface. Metabolic tissue does extend to the midline posteriorly. 2. One enlarged intensely hypermetabolic LEFT level II lymph node a second smaller minimally metabolic level II lymph node. 3. No contralateral RIGHT cervical lymphadenopathy identified. 4. Intense activity associated nodule within the LEFT lobe of thyroid gland. Favor thyroid carcinoma or thyroid adenoma over metastatic lesion. Recommend thyroid ultrasound and potential biopsy. 5. No evidence thoracic metastasis or abdomen pelvis metastasis   12/01/2018 Cancer Staging   Staging form: Pharynx - P16 Negative Oropharynx, AJCC 8th Edition - Clinical stage from 12/01/2018: Stage IVA (cT2, cN2, cM0, p16-) - Signed by Maylon Peppers,  Krista Blue, MD on 12/01/2018   12/15/2018 -  Chemotherapy   The patient had palonosetron (ALOXI) injection 0.25 mg, 0.25 mg, Intravenous,  Once, 0 of 7 cycles CARBOplatin (PARAPLATIN) 140 mg in sodium chloride 0.9 % 100 mL chemo infusion, 140 mg (100 % of original dose 137.4 mg), Intravenous,  Once, 0 of 7 cycles Dose modification: 137.4 mg (original dose 137.4 mg, Cycle 1)  for chemotherapy treatment.      MEDICAL HISTORY:  Past Medical History:  Diagnosis Date  . ANEMIA, CHRONIC DISEASE NEC 03/15/2006  . Carotid artery occlusion   . Cirrhosis ([place])   . Colon polyps   . Constipation due to opioid therapy 06/05/2014  . DEGENERATIVE JOINT DISEASE 05/06/2007   On going for >5 yrs No imaging to confirm Has tried ultram, mobic, neurontin which did not work Ambulance person tried once worked well    . Diabetes mellitus    Type II  . DIABETIC  RETINOPATHY 03/15/2006  . 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 Diastolic dysfunction 08/19/9483   Grade 1 by Echocardiogram 12/13/14  . Elevated liver enzymes   . Heart murmur   . Hemorrhoids   . History of kidney stones   . Hyperlipidemia 04/01/2011  . Hypertension   . Mild AI (aortic insufficiency)   . Moderate aortic regurgitation 09/25/2008   12/13/14  Echocardiogram    . Myalgia   . PAD (peripheral artery disease) ([place])   . PANCREATITIS, CHRONIC 03/29/2006  . Peripheral arterial disease ([place])   . Pneumonia    age [age]  . RENAL INSUFFICIENCY, CHRONIC 03/15/2006  . Tonsillar mass    Left    SURGICAL HISTORY: Past Surgical History:  Procedure Laterality Date  . COLONOSCOPY    . DILATION AND CURETTAGE OF UTERUS N/A 06/16/2016   Procedure: DILATATION AND CURETTAGE;  Surgeon: Mora Bellman, MD;  Location: [facility];  Service: Gynecology;  Laterality: N/A;  . HYSTEROSCOPY N/A 06/16/2016   Procedure: HYSTEROSCOPY;  Surgeon: Mora Bellman, MD;  Location: [facility];  Service: Gynecology;  Laterality: N/A;  . LOWER EXTREMITY ANGIOGRAPHY N/A 08/09/2018   Procedure: LOWER EXTREMITY ANGIOGRAPHY;  Surgeon: Adrian Prows, MD;  Location: [facility];  Service: Cardiovascular;  Laterality: N/A;  . MOUTH SURGERY N/A   . POLYPECTOMY N/A 06/16/2016   Procedure: POLYPECTOMY;  Surgeon: Mora Bellman, MD;  Location: [facility];  Service: Gynecology;  Laterality: N/A;    SOCIAL HISTORY: Social History   Socioeconomic History  . Marital status: Single    Spouse name: Not on file  . Number of children: 3  . Years of education: Not on file  . Highest education level: Not on file  Occupational History  . Occupation: retired- disabled  Social Needs  . Financial resource strain: Not on file  . Food insecurity    Worry: Not on file    Inability: Not on file  . Transportation needs    Medical: Yes    Non-medical: Yes  Tobacco Use  . Smoking status: Former Smoker    Packs/day: 0.30    Years: 44.00    Pack years: 13.20    Types: Cigarettes    Quit date: 03/26/2016    Years since quitting: 2.6  . Smokeless tobacco: Never Used  Substance and Sexual Activity  . Alcohol use: Not Currently    Alcohol/week: 0.0 standard drinks    Comment: "i used to be a heavy drinker"  . Drug use: No  . Sexual activity: Not on file  Lifestyle  .  Physical activity     Days per week: Not on file    Minutes per session: Not on file  . Stress: Not on file  Relationships  . Social Herbalist on phone: Not on file    Gets together: Not on file    Attends religious service: Not on file    Active member of club or organization: Not on file    Attends meetings of clubs or organizations: Not on file    Relationship status: Not on file  . Intimate partner violence    Fear of current or ex partner: No    Emotionally abused: No    Physically abused: No    Forced sexual activity: No  Other Topics Concern  . Not on file  Social History Narrative  . Not on file    FAMILY HISTORY: Family History  Problem Relation Age of Onset  . Dementia Mother   . Colon cancer Neg Hx     ALLERGIES:  is allergic to atorvastatin; gabapentin; contrast media [iodinated diagnostic agents]; and nicorette [nicotine].  MEDICATIONS:  Current Outpatient Medications  Medication Sig Dispense Refill  . ACCU-CHEK AVIVA PLUS test strip TEST three times a day 100 each 11  . amLODipine (NORVASC) 10 MG tablet Take 10 mg by mouth daily.    Marland Kitchen aspirin EC 81 MG tablet Take 1 tablet (81 mg total) by mouth daily.    . Blood Glucose Monitoring Suppl (ACCU-CHEK AVIVA PLUS) w/Device KIT Use to check you blood sugar 3 times a day 1 kit 1  . dexamethasone (DECADRON) 4 MG tablet Take 2 tablets (8 mg total) by mouth daily. Start the day after chemotherapy for 2 days. Take with food. 30 tablet 1  . HYDROcodone-acetaminophen (NORCO/VICODIN) 5-325 MG tablet Take by mouth.    . lidocaine-prilocaine (EMLA) cream Apply to affected area once 30 g 3  . LINZESS 145 MCG CAPS capsule Take 145 mcg by mouth daily.     Marland Kitchen omeprazole (PRILOSEC) 40 MG capsule Take 40 mg by mouth daily.    . ondansetron (ZOFRAN) 8 MG tablet Take 1 tablet (8 mg total) by mouth 2 (two) times daily as needed for refractory nausea / vomiting. Start on day 3 after chemotherapy. 30 tablet 1  . Pancrelipase, Lip-Prot-Amyl,  24000-76000 units CPEP Take 4 capsules (96,000 Units total) by mouth See admin instructions. Take 4 capsules by mouth 2-3 times daily before meals (Patient taking differently: Take 2-4 capsules by mouth See admin instructions. Take 4 capsules by mouth two to three times a day with meals and 2 capsules with any snacks) 1170 capsule 3  . PAZEO 0.7 % SOLN INT 1 GTT IN OU QPM    . pregabalin (LYRICA) 75 MG capsule Take 1 capsule (75 mg total) by mouth daily. 60 capsule 0  . PROAIR HFA 108 (90 Base) MCG/ACT inhaler Inhale 2 puffs into the lungs 4 (four) times daily as needed for wheezing or shortness of breath.  3  . prochlorperazine (COMPAZINE) 10 MG tablet Take 1 tablet (10 mg total) by mouth every 6 (six) hours as needed (Nausea or vomiting). 30 tablet 1  . rivaroxaban (XARELTO) 2.5 MG TABS tablet Take 1 tablet (2.5 mg total) by mouth 2 (two) times daily. 60 tablet 1  . rosuvastatin (CRESTOR) 20 MG tablet TAKE 1 TABLET BY MOUTH EVERY DAY (Patient taking differently: Take 20 mg by mouth daily. ) 90 tablet 3  . silver sulfADIAZINE (SILVADENE) 1 % cream Apply  pea-sized amount to wound daily. 50 g 0  . sodium chloride (MURO 128) 5 % ophthalmic solution     . sucralfate (CARAFATE) 1 g tablet Take 1 tablet (1 g total) by mouth 3 (three) times daily after meals. 60 tablet 0  . TRESIBA FLEXTOUCH 100 UNIT/ML SOPN FlexTouch Pen Inject 20 Units into the skin every morning.      No current facility-administered medications for this visit.     PHYSICAL EXAMINATION: ECOG PERFORMANCE STATUS: 2 - Symptomatic, <50% confined to bed  Vitals:   12/05/18 1102  BP: 138/63  Pulse: 88  Resp: 17  Temp: 97.8 F (36.6 C)  SpO2: 100%   Filed Weights   12/05/18 1102  Weight: 137 lb 12.8 oz (62.5 kg)    GENERAL: alert, no distress and comfortable, thin SKIN: skin color, texture, turgor are normal, no rashes or significant lesions EYES: conjunctiva are pink and non-injected, sclera clear OROPHARYNX: no exudate, no  erythema; lips, buccal mucosa, and tongue normal  NECK: supple, non-tender LYMPH:  Left Level II cervical LN ~3cm, no definite oral lesion visualized  LUNGS: clear to auscultation with normal breathing effort HEART: regular rate & rhythm, no murmurs, no lower extremity edema ABDOMEN: soft, non-tender, non-distended, normal bowel sounds Musculoskeletal: no cyanosis of digits and no clubbing  PSYCH: alert & oriented x 3, fluent speech NEURO: no focal motor/sensory deficits  LABORATORY DATA:  I have reviewed the data as listed Lab Results  Component Value Date   WBC 4.0 12/05/2018   HGB 11.1 (L) 12/05/2018   HCT 35.8 (L) 12/05/2018   MCV 86.9 12/05/2018   PLT 175 12/05/2018   Lab Results  Component Value Date   NA 138 12/05/2018   K 4.8 12/05/2018   CL 109 12/05/2018   CO2 23 12/05/2018    RADIOGRAPHIC STUDIES: I have personally reviewed the radiological images as listed and agreed with the findings in the report. Ct Soft Tissue Neck W Contrast  Result Date: 11/07/2018 CLINICAL DATA:  Sore throat for 1 year, worse in the past 2 weeks. Difficulty swallowing. History of smoking. EXAM: CT NECK WITH CONTRAST TECHNIQUE: Multidetector CT imaging of the neck was performed using the standard protocol following the bolus administration of intravenous contrast. CONTRAST:  13m OMNIPAQUE IOHEXOL 300 MG/ML  SOLN COMPARISON:  06/10/2017 FINDINGS: Pharynx and larynx: There is a new enhancing mass in the left lateral and posterior aspects of the superior oropharynx measuring approximately 2.6 cm. The airway is patent. Subtle right-sided glottic asymmetry on the prior CT is less apparent today, and a discrete laryngeal mass is not identified. Salivary glands: No inflammation, mass, or stone. Thyroid: Scattered subcentimeter thyroid nodules as previously seen. Lymph nodes: 2 adjacent abnormal left level IIA lymph nodes with heterogeneous low-attenuation measure 32 x 15 mm and 9 x 8 mm. No enlarged or  suspicious lymph nodes are identified in the right neck. Vascular: Normal variant aortic arch branching pattern with common origin of the brachiocephalic and left common carotid arteries. Major vascular structures of the neck are patent with atherosclerotic plaque at both carotid bifurcations not resulting in flow limiting stenosis. The left vertebral artery is hypoplastic. Limited intracranial: Unremarkable. Visualized orbits: Bilateral cataract extraction. Mastoids and visualized paranasal sinuses: Clear. Skeleton: Advanced disc degeneration in the included upper thoracic spine with disc space narrowing, degenerative endplate sclerosis, and Schmorl's node formation, progressed from the prior study. Mild disc degeneration in the cervical spine. Upper chest: Mild motion artifact in the lung apices without  consolidation or mass. Mild centrilobular emphysema. 3 mm subpleural nodule laterally in the right upper lobe (series 6, image 100), also present on the prior study and unlikely to be malignant. Other: None. IMPRESSION: New 2.6 cm left superior oropharyngeal mass and left level II lymphadenopathy consistent with metastatic squamous cell carcinoma. Aortic Atherosclerosis (ICD10-I70.0) and Emphysema (ICD10-J43.9). Electronically Signed   By: Logan Bores M.D.   On: 11/07/2018 17:32   Nm Pet Image Initial (pi) Skull Base To Thigh  Result Date: 11/25/2018 CLINICAL DATA:  Initial treatment strategy for head neck carcinoma. Tonsillar mass. EXAM: NUCLEAR MEDICINE PET SKULL BASE TO THIGH TECHNIQUE: 6.5 mCi F-18 FDG was injected intravenously. Full-ring PET imaging was performed from the skull base to thigh after the radiotracer. CT data was obtained and used for attenuation correction and anatomic localization. Fasting blood glucose: 169 mg/dl COMPARISON:  Neck CT 11/07/2018 FINDINGS: Mediastinal blood pool activity: SUV max 1.83 Liver activity: SUV max NA NECK: Intense metabolic activity confined to the mucosal  surface of the LEFT posterolateral superior oropharynx with SUV max equal 9.1. Metabolic tissue extends to the midline with associated mucosal thickening (image 128 of fused data set). Hypermetabolic and enlarged LEFT level 2 lymph node measures 16 mm with SUV max equal 4.9. A smaller more posterior level 2 lymph node on the LEFT measures 6 mm (image 32/4) with SUV max equal 2.1 No hypermetabolic lymph nodes in the RIGHT neck. Intense hypermetabolic activity associated with the lower pole of the LEFT lobe of the thyroid gland. 1.2 cm round lesion associated with this metabolic activity which is intense (SUV max equal 6.4). No hypermetabolic supraclavicular or level III lymph nodes. Incidental CT findings: none CHEST: No hypermetabolic mediastinal or hilar nodes. No suspicious pulmonary nodules on the CT scan. Incidental CT findings: none ABDOMEN/PELVIS: No abnormal hypermetabolic activity within the liver, pancreas, adrenal glands, or spleen. No hypermetabolic lymph nodes in the abdomen or pelvis. Incidental CT findings: Atherosclerotic calcification of the aorta. Uterus normal. No adnexal abnormality SKELETON: No focal hypermetabolic activity to suggest skeletal metastasis. Incidental CT findings: none IMPRESSION: 1. Hypermetabolic lesion in the posterior LEFT oropharynx appears confined to the pharyngeal mucosal surface. Metabolic tissue does extend to the midline posteriorly. 2. One enlarged intensely hypermetabolic LEFT level II lymph node a second smaller minimally metabolic level II lymph node. 3. No contralateral RIGHT cervical lymphadenopathy identified. 4. Intense activity associated nodule within the LEFT lobe of thyroid gland. Favor thyroid carcinoma or thyroid adenoma over metastatic lesion. Recommend thyroid ultrasound and potential biopsy. 5. No evidence thoracic metastasis or abdomen pelvis metastasis. Electronically Signed   By: Suzy Bouchard M.D.   On: 11/25/2018 14:32    PATHOLOGY: I have  reviewed the pathology reports as documented in the oncologist history.

## 2018-12-01 ENCOUNTER — Ambulatory Visit (INDEPENDENT_AMBULATORY_CARE_PROVIDER_SITE_OTHER): Payer: Medicare Other | Admitting: Podiatry

## 2018-12-01 ENCOUNTER — Other Ambulatory Visit: Payer: Self-pay

## 2018-12-01 ENCOUNTER — Other Ambulatory Visit: Payer: Self-pay | Admitting: Hematology

## 2018-12-01 DIAGNOSIS — D649 Anemia, unspecified: Secondary | ICD-10-CM

## 2018-12-01 DIAGNOSIS — C099 Malignant neoplasm of tonsil, unspecified: Secondary | ICD-10-CM

## 2018-12-01 DIAGNOSIS — Q828 Other specified congenital malformations of skin: Secondary | ICD-10-CM

## 2018-12-01 DIAGNOSIS — E1142 Type 2 diabetes mellitus with diabetic polyneuropathy: Secondary | ICD-10-CM

## 2018-12-02 ENCOUNTER — Encounter (HOSPITAL_COMMUNITY): Payer: Self-pay | Admitting: Dentistry

## 2018-12-02 ENCOUNTER — Ambulatory Visit (HOSPITAL_COMMUNITY): Payer: Self-pay | Admitting: Dentistry

## 2018-12-02 VITALS — BP 131/58 | HR 73 | Temp 98.8°F

## 2018-12-02 DIAGNOSIS — C099 Malignant neoplasm of tonsil, unspecified: Secondary | ICD-10-CM

## 2018-12-02 DIAGNOSIS — Z01818 Encounter for other preprocedural examination: Secondary | ICD-10-CM

## 2018-12-02 DIAGNOSIS — Z972 Presence of dental prosthetic device (complete) (partial): Secondary | ICD-10-CM

## 2018-12-02 DIAGNOSIS — K08109 Complete loss of teeth, unspecified cause, unspecified class: Secondary | ICD-10-CM

## 2018-12-02 DIAGNOSIS — K0820 Unspecified atrophy of edentulous alveolar ridge: Secondary | ICD-10-CM

## 2018-12-02 DIAGNOSIS — J3489 Other specified disorders of nose and nasal sinuses: Secondary | ICD-10-CM

## 2018-12-02 NOTE — Patient Instructions (Signed)

## 2018-12-02 NOTE — Progress Notes (Signed)
DENTAL CONSULTATION  Date of Consultation:  12/02/2018 Patient Name:   [name] Date of Birth:   [birth year] Medical Record Number: [id number]  COVID 19 SCREENING: The patient does not symptoms concerning for COVID-19 infection (Including fever, chills, cough, or new SHORTNESS OF BREATH).    VITALS: BP (!) 131/58 (BP Location: Right Arm)   Pulse 73   Temp 98.8 F (37.1 C)   LMP 02/22/2016 (LMP Unknown)   CHIEF COMPLAINT: Patient referred by Dr. Isidore Moos for dental consultation.  HPI: [name] is a 68 year old female recently diagnosed with squamous cell carcinoma of the left tonsil.  Patient was seen for evaluation for TORS procedure was felt not to be a good surgical candidate.  Patient with anticipated chemoradiation therapy.  Patient is now seen as part of a medically necessary pre-chemoradiation therapy dental protocol examination  The patient currently denies acute dental problems. Patient is edentulous.  Patient has an upper and lower complete denture that were fabricated in approximately 2008.  This was by a Pharmacist, community on [address] in [place].  Patient cannot remember the name of the dentist.  Patient denies having any problems with her upper and lower dentures.  Patient unfortunately did not bring her dentures with her today.  Patient denies having dental phobia.  PROBLEM LIST: Patient Active Problem List   Diagnosis Date Noted  . Squamous cell carcinoma of left tonsil (HCC) 11/25/2018    Priority: High  . Critical lower limb ischemia 08/08/2018  . Diabetes mellitus type 2 in obese ([place]) 05/27/2018  . Acute on chronic pancreatitis ([place])   . Acute pancreatitis 05/26/2018  . Midfoot ulceration, right, with unspecified severity ([place]) 04/05/2018  . Acute pancreatitis with uninfected necrosis 04/01/2018  . Constipation 04/01/2018  . Recurrent epistaxis 05/31/2017  . Throat pain in adult 05/31/2017  . Tubular adenoma of colon 06/30/2016  .  Diverticulosis of colon without hemorrhage 06/30/2016  . Postmenopausal vaginal bleeding   . Long term (current) use of opiate analgesic 04/09/2016  . Bilateral carotid artery disease ([place]) 11/28/2015  . Pancreatic insufficiency 05/09/2015  . 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 Risk for coronary artery disease greater than 20% in next 10 years 03/14/2015  . Diastolic dysfunction 66/29/4765  . Vulvar cyst 01/23/2015  . Abnormal uterine bleeding 12/02/2014  . Unexplained night sweats 12/02/2014  . Constipation due to opioid therapy 06/05/2014  . Loss of weight 03/29/2014  . Peripheral arterial disease ([place]) 12/12/2013  . GERD (gastroesophageal reflux disease) 10/12/2012  . Healthcare maintenance 09/29/2012  . Back pain 03/28/2012  . Hyperlipidemia 04/01/2011  . BOILS, RECURRENT 03/13/2010  . Moderate aortic regurgitation 09/25/2008  . HEMORRHOIDS 06/08/2008  . Osteoarthritis 05/06/2007  . Former tobacco use 09/27/2006  . History of osteoporosis 06/29/2006  . Chronic pancreatitis ([place]) 03/29/2006  . Uncontrolled type 2 diabetes mellitus with chronic kidney disease, without long-term current use of insulin ([place]) 03/15/2006  . Diabetic neuropathy, painful ([place]) 03/15/2006  . Anemia of chronic disease 03/15/2006  . Essential hypertension 03/15/2006  . VENTRICULAR HYPERTROPHY, LEFT 03/15/2006  . CKD stage 3 due to type 2 diabetes mellitus ([place]) 03/15/2006    PMH: Past Medical History:  Diagnosis Date  . ANEMIA, CHRONIC DISEASE NEC 03/15/2006  . Carotid artery occlusion   . Cirrhosis ([place])   . Colon polyps   . Constipation due to opioid therapy 06/05/2014  . DEGENERATIVE JOINT DISEASE 05/06/2007   On going for >5 yrs No imaging to confirm Has tried  ultram, mobic, neurontin which did not work Ambulance person tried once worked well    . Diabetes mellitus    Type II  . DIABETIC  RETINOPATHY 03/15/2006  . 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 Diastolic dysfunction 63/14/9702    Grade 1 by Echocardiogram 12/13/14  . Elevated liver enzymes   . Heart murmur   . Hemorrhoids   . History of kidney stones   . Hyperlipidemia 04/01/2011  . Hypertension   . Mild AI (aortic insufficiency)   . Moderate aortic regurgitation 09/25/2008   12/13/14 Echocardiogram    . Myalgia   . PAD (peripheral artery disease) ([place])   . PANCREATITIS, CHRONIC 03/29/2006  . Peripheral arterial disease ([place])   . Pneumonia    age [age]  . RENAL INSUFFICIENCY, CHRONIC 03/15/2006  . Tonsillar mass    Left    PSH: Past Surgical History:  Procedure Laterality Date  . COLONOSCOPY    . DILATION AND CURETTAGE OF UTERUS N/A 06/16/2016   Procedure: DILATATION AND CURETTAGE;  Surgeon: Mora Bellman, MD;  Location: [facility];  Service: Gynecology;  Laterality: N/A;  . HYSTEROSCOPY N/A 06/16/2016   Procedure: HYSTEROSCOPY;  Surgeon: Mora Bellman, MD;  Location: [facility];  Service: Gynecology;  Laterality: N/A;  . LOWER EXTREMITY ANGIOGRAPHY N/A 08/09/2018   Procedure: LOWER EXTREMITY ANGIOGRAPHY;  Surgeon: Adrian Prows, MD;  Location: [facility];  Service: Cardiovascular;  Laterality: N/A;  . MOUTH SURGERY N/A   . POLYPECTOMY N/A 06/16/2016   Procedure: POLYPECTOMY;  Surgeon: Mora Bellman, MD;  Location: [facility];  Service: Gynecology;  Laterality: N/A;    ALLERGIES: Allergies  Allergen Reactions  . Atorvastatin Other (See Comments)    Caused muscles in her back to be very sore  . Gabapentin Swelling    Legs and feet swell  . Contrast Media [Iodinated Diagnostic Agents] Rash  . Nicorette [Nicotine] Nausea Only    MEDICATIONS: Current Outpatient Medications  Medication Sig Dispense Refill  . ACCU-CHEK AVIVA PLUS test strip TEST three times a day 100 each 11  . amLODipine (NORVASC) 10 MG tablet Take 10 mg by mouth daily.    Marland Kitchen aspirin EC 81 MG tablet Take 1 tablet (81 mg total) by mouth daily.    . Blood Glucose Monitoring Suppl (ACCU-CHEK AVIVA PLUS) w/Device KIT Use to check you blood  sugar 3 times a day 1 kit 1  . HYDROcodone-acetaminophen (NORCO/VICODIN) 5-325 MG tablet Take by mouth.    Marland Kitchen LINZESS 145 MCG CAPS capsule Take 145 mcg by mouth daily.     Marland Kitchen omeprazole (PRILOSEC) 40 MG capsule Take 40 mg by mouth daily.    . Pancrelipase, Lip-Prot-Amyl, 24000-76000 units CPEP Take 4 capsules (96,000 Units total) by mouth See admin instructions. Take 4 capsules by mouth 2-3 times daily before meals (Patient taking differently: Take 2-4 capsules by mouth See admin instructions. Take 4 capsules by mouth two to three times a day with meals and 2 capsules with any snacks) 1170 capsule 3  . pregabalin (LYRICA) 75 MG capsule Take 1 capsule (75 mg total) by mouth daily. 60 capsule 0  . PROAIR HFA 108 (90 Base) MCG/ACT inhaler Inhale 2 puffs into the lungs 4 (four) times daily as needed for wheezing or shortness of breath.  3  . rivaroxaban (XARELTO) 2.5 MG TABS tablet Take 1 tablet (2.5 mg total) by mouth 2 (two) times daily. 60 tablet 1  . rosuvastatin (CRESTOR) 20 MG tablet TAKE 1 TABLET BY MOUTH EVERY DAY (Patient taking  differently: Take 20 mg by mouth daily. ) 90 tablet 3  . silver sulfADIAZINE (SILVADENE) 1 % cream Apply pea-sized amount to wound daily. 50 g 0  . sodium chloride (MURO 128) 5 % ophthalmic solution     . sucralfate (CARAFATE) 1 g tablet Take 1 tablet (1 g total) by mouth 3 (three) times daily after meals. 60 tablet 0  . TRESIBA FLEXTOUCH 100 UNIT/ML SOPN FlexTouch Pen Inject 20 Units into the skin every morning.      No current facility-administered medications for this visit.     LABS: Lab Results  Component Value Date   WBC 4.8 11/08/2018   HGB 10.7 (L) 11/29/2018   HCT 31.7 (L) 11/08/2018   MCV 86.6 11/08/2018   PLT 184 11/08/2018      Component Value Date/Time   NA 132 (L) 11/08/2018 1025   NA 136 08/02/2018 1347   K 4.3 11/08/2018 1025   CL 102 11/08/2018 1025   CO2 23 11/08/2018 1025   GLUCOSE 444 (H) 11/08/2018 1025   BUN 37 (H) 11/08/2018 1025    BUN 35 (H) 08/02/2018 1347   CREATININE 1.33 (H) 11/08/2018 1025   CREATININE 1.35 (H) 05/03/2014 1522   CALCIUM 8.3 (L) 11/08/2018 1025   GFRNONAA 42 (L) 11/08/2018 1025   GFRNONAA 42 (L) 05/03/2014 1522   GFRAA 48 (L) 11/08/2018 1025   GFRAA 49 (L) 05/03/2014 1522   Lab Results  Component Value Date   INR 1.00 06/11/2017   INR 1.0 04/15/2017   No results found for: PTT  SOCIAL HISTORY: Social History   Socioeconomic History  . Marital status: Single    Spouse name: Not on file  . Number of children: 3  . Years of education: Not on file  . Highest education level: Not on file  Occupational History  . Occupation: retired- disabled  Social Needs  . Financial resource strain: Not on file  . Food insecurity    Worry: Not on file    Inability: Not on file  . Transportation needs    Medical: Yes    Non-medical: Yes  Tobacco Use  . Smoking status: Former Smoker    Packs/day: 0.30    Years: 44.00    Pack years: 13.20    Types: Cigarettes    Quit date: 03/26/2016    Years since quitting: 2.6  . Smokeless tobacco: Never Used  Substance and Sexual Activity  . Alcohol use: Not Currently    Alcohol/week: 0.0 standard drinks  . Drug use: No  . Sexual activity: Not on file  Lifestyle  . Physical activity    Days per week: Not on file    Minutes per session: Not on file  . Stress: Not on file  Relationships  . Social Herbalist on phone: Not on file    Gets together: Not on file    Attends religious service: Not on file    Active member of club or organization: Not on file    Attends meetings of clubs or organizations: Not on file    Relationship status: Not on file  . Intimate partner violence    Fear of current or ex partner: No    Emotionally abused: No    Physically abused: No    Forced sexual activity: No  Other Topics Concern  . Not on file  Social History Narrative  . Not on file    FAMILY HISTORY: Family History  Problem Relation Age of  Onset  . Dementia Mother   . Colon cancer Neg Hx     REVIEW OF SYSTEMS: Reviewed with the patient as per History of present illness. Psych: Patient denies having dental phobia.  DENTAL HISTORY: CHIEF COMPLAINT: Patient referred by Dr. Isidore Moos for dental consultation.  HPI: Wylene TANIESHA GLANZ is a 68 year old female recently diagnosed with squamous cell carcinoma of the left tonsil.  Patient was seen for evaluation for TORS procedure was felt not to be a good surgical candidate.  Patient with anticipated chemoradiation therapy.  Patient is now seen as part of a medically necessary pre-chemoradiation therapy dental protocol examination  The patient currently denies acute dental problems. Patient is edentulous.  Patient has an upper and lower complete denture that were fabricated in approximately 2008.  This was by a Pharmacist, community on [address] in [place].  Patient cannot remember the name of the dentist.  Patient denies having any problems with her upper and lower dentures.  Patient unfortunately did not bring her dentures with her today.  Patient denies having dental phobia.  DENTAL EXAMINATION: GENERAL: The patient is well-developed, well-nourished female no acute distress. HEAD AND NECK: The patient has left neck lymphadenopathy.  I do not palpate any right neck lymphadenopathy.  The patient denies acute TMJ symptoms. INTRAORAL EXAM: Patient has normal saliva.  The patient is edentulous.  There is atrophy of the edentulous alveolar ridges.  There is no evidence of denture irritation. DENTITION: Patient is completely edentulous.  There is no evidence of retained root segments or impacted teeth. PROSTHODONTIC: The patient indicates that she has upper and lower complete dentures that were fabricated in 2008.  Patient indicates that the dentures fit well.  Patient did not bring the dentures with her today and I am unable to evaluate the clinical acceptability of the dentures. OCCLUSION:  I am unable to evaluate the occlusion of the dentures at this time.  RADIOGRAPHIC INTERPRETATION: An orthopantogram was taken. The patient is edentulous.  There is atrophy of the edentulous alveolar ridges.  There are no apparent retained root segments or impacted teeth.  There is pneumatization of the maxillary sinuses.   ASSESSMENTS: 1.  Squamous cell carcinoma of the left tonsil 2.  Anticipated chemoradiation therapy  3.  Pre-chemoradiation therapy dental protocol examination 4.  Patient is completely edentulous 5.  Atrophy of edentulous alveolar ridges. 6.  Pneumatization of the bilateral maxillary sinuses 7.  History of upper and lower complete dentures that fit well by patient report. 8.  Risk for bleeding with invasive dental procedures due to current anticoagulant therapy  PLAN/RECOMMENDATIONS: 1. I discussed the risks, benefits, and complications of various treatment options with the patient in relationship to her medical and dental conditions, anticipated chemoradiation therapy, and chemoradiation therapy side effects to include xerostomia, trismus, mucositis, taste changes, gum and jawbone changes, and risk for infection and osteoradionecrosis.  We discussed various treatment options to include no treatment, pre-prosthetic surgery as indicated,  implant therapy, and replacement of missing teeth as indicated. The patient currently does not wish to proceed with any dental treatment at this time.  Patient will follow-up with the dentist of her choice for further evaluation for new upper and lower complete dentures or denture problems as indicated.  Patient is currently cleared for chemoradiation therapy at this time.     2. Discussion of findings with medical team and coordination of future medical and dental care as needed.  I spent in excess of  90 minutes during the  conduct of this consultation and >50% of this time involved direct face-to-face encounter for counseling and/or  coordination of the patient's care.    Lenn Cal, DDS

## 2018-12-05 ENCOUNTER — Inpatient Hospital Stay (HOSPITAL_BASED_OUTPATIENT_CLINIC_OR_DEPARTMENT_OTHER): Payer: Medicare Other | Admitting: Hematology

## 2018-12-05 ENCOUNTER — Telehealth: Payer: Self-pay | Admitting: Hematology

## 2018-12-05 ENCOUNTER — Encounter: Payer: Self-pay | Admitting: *Deleted

## 2018-12-05 ENCOUNTER — Encounter: Payer: Self-pay | Admitting: Hematology

## 2018-12-05 ENCOUNTER — Inpatient Hospital Stay: Payer: Medicare Other | Attending: Hematology

## 2018-12-05 ENCOUNTER — Other Ambulatory Visit: Payer: Self-pay

## 2018-12-05 VITALS — BP 138/63 | HR 88 | Temp 97.8°F | Resp 17 | Ht 65.0 in | Wt 137.8 lb

## 2018-12-05 DIAGNOSIS — D649 Anemia, unspecified: Secondary | ICD-10-CM | POA: Diagnosis not present

## 2018-12-05 DIAGNOSIS — Z7982 Long term (current) use of aspirin: Secondary | ICD-10-CM | POA: Insufficient documentation

## 2018-12-05 DIAGNOSIS — N183 Chronic kidney disease, stage 3 unspecified: Secondary | ICD-10-CM | POA: Diagnosis not present

## 2018-12-05 DIAGNOSIS — D638 Anemia in other chronic diseases classified elsewhere: Secondary | ICD-10-CM

## 2018-12-05 DIAGNOSIS — G893 Neoplasm related pain (acute) (chronic): Secondary | ICD-10-CM | POA: Diagnosis not present

## 2018-12-05 DIAGNOSIS — Z931 Gastrostomy status: Secondary | ICD-10-CM | POA: Diagnosis not present

## 2018-12-05 DIAGNOSIS — Z79899 Other long term (current) drug therapy: Secondary | ICD-10-CM | POA: Insufficient documentation

## 2018-12-05 DIAGNOSIS — I129 Hypertensive chronic kidney disease with stage 1 through stage 4 chronic kidney disease, or unspecified chronic kidney disease: Secondary | ICD-10-CM | POA: Diagnosis not present

## 2018-12-05 DIAGNOSIS — Z9221 Personal history of antineoplastic chemotherapy: Secondary | ICD-10-CM | POA: Insufficient documentation

## 2018-12-05 DIAGNOSIS — C099 Malignant neoplasm of tonsil, unspecified: Secondary | ICD-10-CM

## 2018-12-05 DIAGNOSIS — Z794 Long term (current) use of insulin: Secondary | ICD-10-CM | POA: Insufficient documentation

## 2018-12-05 DIAGNOSIS — K7460 Unspecified cirrhosis of liver: Secondary | ICD-10-CM | POA: Diagnosis not present

## 2018-12-05 DIAGNOSIS — E1151 Type 2 diabetes mellitus with diabetic peripheral angiopathy without gangrene: Secondary | ICD-10-CM | POA: Diagnosis not present

## 2018-12-05 DIAGNOSIS — E1122 Type 2 diabetes mellitus with diabetic chronic kidney disease: Secondary | ICD-10-CM | POA: Insufficient documentation

## 2018-12-05 DIAGNOSIS — Z923 Personal history of irradiation: Secondary | ICD-10-CM | POA: Insufficient documentation

## 2018-12-05 DIAGNOSIS — Z7901 Long term (current) use of anticoagulants: Secondary | ICD-10-CM | POA: Diagnosis not present

## 2018-12-05 DIAGNOSIS — Z87891 Personal history of nicotine dependence: Secondary | ICD-10-CM | POA: Diagnosis not present

## 2018-12-05 LAB — CBC WITH DIFFERENTIAL (CANCER CENTER ONLY)
Abs Immature Granulocytes: 0.01 10*3/uL (ref 0.00–0.07)
Basophils Absolute: 0 10*3/uL (ref 0.0–0.1)
Basophils Relative: 1 %
Eosinophils Absolute: 0.2 10*3/uL (ref 0.0–0.5)
Eosinophils Relative: 4 %
HCT: 35.8 % — ABNORMAL LOW (ref 36.0–46.0)
Hemoglobin: 11.1 g/dL — ABNORMAL LOW (ref 12.0–15.0)
Immature Granulocytes: 0 %
Lymphocytes Relative: 37 %
Lymphs Abs: 1.5 10*3/uL (ref 0.7–4.0)
MCH: 26.9 pg (ref 26.0–34.0)
MCHC: 31 g/dL (ref 30.0–36.0)
MCV: 86.9 fL (ref 80.0–100.0)
Monocytes Absolute: 0.4 10*3/uL (ref 0.1–1.0)
Monocytes Relative: 9 %
Neutro Abs: 2 10*3/uL (ref 1.7–7.7)
Neutrophils Relative %: 49 %
Platelet Count: 175 10*3/uL (ref 150–400)
RBC: 4.12 MIL/uL (ref 3.87–5.11)
RDW: 17.2 % — ABNORMAL HIGH (ref 11.5–15.5)
WBC Count: 4 10*3/uL (ref 4.0–10.5)
nRBC: 0 % (ref 0.0–0.2)

## 2018-12-05 LAB — SAVE SMEAR(SSMR), FOR PROVIDER SLIDE REVIEW

## 2018-12-05 LAB — CMP (CANCER CENTER ONLY)
ALT: 29 U/L (ref 0–44)
AST: 22 U/L (ref 15–41)
Albumin: 3.7 g/dL (ref 3.5–5.0)
Alkaline Phosphatase: 130 U/L — ABNORMAL HIGH (ref 38–126)
Anion gap: 6 (ref 5–15)
BUN: 28 mg/dL — ABNORMAL HIGH (ref 8–23)
CO2: 23 mmol/L (ref 22–32)
Calcium: 9.3 mg/dL (ref 8.9–10.3)
Chloride: 109 mmol/L (ref 98–111)
Creatinine: 1.25 mg/dL — ABNORMAL HIGH (ref 0.44–1.00)
GFR, Est AFR Am: 52 mL/min — ABNORMAL LOW (ref >60)
GFR, Estimated: 45 mL/min — ABNORMAL LOW (ref >60)
Glucose, Bld: 82 mg/dL (ref 70–99)
Potassium: 4.8 mmol/L (ref 3.5–5.1)
Sodium: 138 mmol/L (ref 135–145)
Total Bilirubin: 0.4 mg/dL (ref 0.3–1.2)
Total Protein: 7.7 g/dL (ref 6.5–8.1)

## 2018-12-05 MED ORDER — DEXAMETHASONE 4 MG PO TABS: 8.0000 mg | ORAL_TABLET | Freq: Every day | ORAL | 1 refills | Status: AC

## 2018-12-05 MED ORDER — ONDANSETRON HCL 8 MG PO TABS: 8.0000 mg | ORAL_TABLET | Freq: Two times a day (BID) | ORAL | 1 refills | Status: AC | PRN

## 2018-12-05 MED ORDER — PROCHLORPERAZINE MALEATE 10 MG PO TABS: 10.0000 mg | ORAL_TABLET | Freq: Four times a day (QID) | ORAL | 1 refills | Status: AC | PRN

## 2018-12-05 MED ORDER — LIDOCAINE-PRILOCAINE 2.5-2.5 % EX CREA: TOPICAL_CREAM | CUTANEOUS | 3 refills | Status: AC

## 2018-12-05 NOTE — Telephone Encounter (Signed)
Appointments for follow up & Chemo Ed scheduled at [facility]/ IB message sent to [facility] in order to have infusion times scheduled in [place] per patient/ Dr Maylon Peppers request.  Calendar printed per 10/12 los

## 2018-12-05 NOTE — Progress Notes (Signed)
Head & Neck Multidisciplinary Clinic Clinical Social Work  Clinical Social Work met with patient/family at head & neck multidisciplinary clinic to offer support and assess for psychosocial needs.  Ms. Shannahan shared she is coping well with diagnosis.  She is somewhat apprehensive about the side effects related to treatment, but is eager to do "what is best".  Patient lives alone, reported her daughter lives nearby and plans to be more supportive.  She has no other concerns at this time.  Patient is interested in [facility] transportation program- CSW made referral to coordinator.  Clinical Social Work briefly discussed Clinical Social Work role and [employer] support programs/services.  Clinical Social Work encouraged patient to call with any additional questions or concerns.   Maryjean Morn, MSW, LCSW, OSW-C Clinical Social Worker [facility] [phone]

## 2018-12-05 NOTE — Progress Notes (Signed)
START ON PATHWAY REGIMEN - Head and Neck     A cycle is every 7 days:     Carboplatin   **Always confirm dose/schedule in your pharmacy ordering system**  Patient Characteristics: Oropharynx, HPV Negative/Unknown, Clinically Staged, Stage IV Disease Classification: Oropharynx HPV Status: Negative (-) Current Disease Status: No Distant Metastases and No Recurrent Disease AJCC T Category: T2 AJCC 8 Stage Grouping: IVA AJCC N Category: cN2a AJCC M Category: M0 Intent of Therapy: Curative Intent, Discussed with Patient

## 2018-12-05 NOTE — Progress Notes (Signed)
Initial RN Navigator Patient Visit  Name: CASSANDA WALMER Date of Referral : 11/16/2018 Diagnosis: Stage IVA (cT2N2M0) squamous cell carcinoma of the left tonsil  Met with patient prior to their visit with MD. Hanley Seamen patient "Your Patient Navigator" handout which explains my role, areas in which I am able to help, and all the contact information for myself and the office. Also gave patient MD and Navigator business card. Reviewed with patient the general overview of expected course after initial diagnosis and time frame for all steps to be completed.  New patient packet given to patient which includes: orientation to office and staff; campus directory; education on My Chart and Advance Directives; and patient centered education on head and neck cancer.  This patient is being seen in this office to expedite visit with [facility], but the patient will predominantly be seen and treated at the [facility]. Patient has already met with Gayleen Orem, head and neck navigator, and he will be the primary navigator for this patient.   Patient completed visit with Dr. Maylon Peppers. Orders are as follows  Port placement - to be scheduled by Gayleen Orem PEG placement - to be scheduled by Gayleen Orem  Patient understands all follow up procedures and expectations. They have my number to reach out for any further clarification or additional needs.

## 2018-12-06 ENCOUNTER — Telehealth: Payer: Self-pay

## 2018-12-06 ENCOUNTER — Telehealth: Payer: Self-pay | Admitting: *Deleted

## 2018-12-06 LAB — IRON AND TIBC
Iron: 46 ug/dL (ref 41–142)
Saturation Ratios: 16 % — ABNORMAL LOW (ref 21–57)
TIBC: 295 ug/dL (ref 236–444)
UIBC: 249 ug/dL (ref 120–384)

## 2018-12-06 LAB — FERRITIN: Ferritin: 46 ng/mL (ref 11–307)

## 2018-12-06 NOTE — Telephone Encounter (Signed)
I think she is still on Xarelto?

## 2018-12-06 NOTE — Telephone Encounter (Signed)
Pt called left vm that she wants to be back on blood thinner because she found out she has throat cancer; Please advise

## 2018-12-06 NOTE — Telephone Encounter (Signed)
Can you call this patient for me and confirm this ? Thanks

## 2018-12-07 NOTE — Telephone Encounter (Signed)
Pt states that she was on xarelto 2.5 bid she thinks.//ah

## 2018-12-07 NOTE — Telephone Encounter (Signed)
Forwarded message

## 2018-12-08 ENCOUNTER — Ambulatory Visit: Payer: Medicare Other | Admitting: Hematology

## 2018-12-08 ENCOUNTER — Other Ambulatory Visit: Payer: Medicare Other

## 2018-12-08 ENCOUNTER — Ambulatory Visit: Payer: Medicare Other

## 2018-12-08 DIAGNOSIS — R293 Abnormal posture: Secondary | ICD-10-CM | POA: Diagnosis not present

## 2018-12-08 DIAGNOSIS — R1310 Dysphagia, unspecified: Secondary | ICD-10-CM

## 2018-12-08 NOTE — Therapy (Signed)
[facility] [address] [place], [place], [address] Phone: [phone]   Fax:  [phone]  Speech Language Pathology Evaluation  Patient Details  Name: [name] MRN: [id number] Date of Birth: 1951/02/05 Referring Provider (SLP): Eppie Gibson   Encounter Date: 12/08/2018  End of Session - 12/08/18 1653    Visit Number  1    Number of Visits  4    Date for SLP Re-Evaluation  03/08/19    SLP Start Time  [id number]    SLP Stop Time   1445    SLP Time Calculation (min)  43 min    Activity Tolerance  Patient tolerated treatment well       Past Medical History:  Diagnosis Date  . ANEMIA, CHRONIC DISEASE NEC 03/15/2006  . Carotid artery occlusion   . Cirrhosis ([place])   . Colon polyps   . Constipation due to opioid therapy 06/05/2014  . DEGENERATIVE JOINT DISEASE 05/06/2007   On going for >5 yrs No imaging to confirm Has tried ultram, mobic, neurontin which did not work Ambulance person tried once worked well    . Diabetes mellitus    Type II  . DIABETIC  RETINOPATHY 03/15/2006  . 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 Diastolic dysfunction 99991111   Grade 1 by Echocardiogram 12/13/14  . Elevated liver enzymes   . Heart murmur   . Hemorrhoids   . History of kidney stones   . Hyperlipidemia 04/01/2011  . Hypertension   . Mild AI (aortic insufficiency)   . Moderate aortic regurgitation 09/25/2008   12/13/14 Echocardiogram    . Myalgia   . PAD (peripheral artery disease) ([place])   . PANCREATITIS, CHRONIC 03/29/2006  . Peripheral arterial disease ([place])   . Pneumonia    age [age]  . RENAL INSUFFICIENCY, CHRONIC 03/15/2006  . Tonsillar mass    Left    Past Surgical History:  Procedure Laterality Date  . COLONOSCOPY    . DILATION AND CURETTAGE OF UTERUS N/A 06/16/2016   Procedure: DILATATION AND CURETTAGE;  Surgeon: Mora Bellman, MD;  Location: [facility];  Service: Gynecology;  Laterality: N/A;  . HYSTEROSCOPY N/A  06/16/2016   Procedure: HYSTEROSCOPY;  Surgeon: Mora Bellman, MD;  Location: [facility];  Service: Gynecology;  Laterality: N/A;  . LOWER EXTREMITY ANGIOGRAPHY N/A 08/09/2018   Procedure: LOWER EXTREMITY ANGIOGRAPHY;  Surgeon: Adrian Prows, MD;  Location: [facility];  Service: Cardiovascular;  Laterality: N/A;  . MOUTH SURGERY N/A   . POLYPECTOMY N/A 06/16/2016   Procedure: POLYPECTOMY;  Surgeon: Mora Bellman, MD;  Location: [facility];  Service: Gynecology;  Laterality: N/A;    There were no vitals filed for this visit.  Subjective Assessment - 12/08/18 1405    Subjective  Pt denies overt s/s aspiration during POs currently.    Currently in Pain?  No/denies         SLP Evaluation [facility] - 12/08/18 1405      SLP Visit Information   SLP Received On  12/08/18    Referring Provider (SLP)  Eppie Gibson    Onset Date  summer 2020    Medical Diagnosis  pharyngeal carcinoma      Subjective   Patient/Family Stated Goal  maintain normal swallowing      General Information   HPI  Pt presented to PCP with sore throat and subsequently saw Dr. Blenda Nicely 08-24-18 - CT neck ordered but was not completed by pt. Pt arrived to ED 11-07-18 due to  cont'd throat pain. 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 Biopsy of lt neck mass 11-15-18 suspicous for SCCA. Pt starts cehmo and rad 03-16-18. Pt endentulous. PEG placement scheduled Oct 20.      Prior Functional Status   Cognitive/Linguistic Baseline  Within functional limits      Cognition   Overall Cognitive Status  Within Functional Limits for tasks assessed      Auditory Comprehension   Overall Auditory Comprehension  Appears within functional limits for tasks assessed      Verbal Expression   Overall Verbal Expression  Appears within functional limits for tasks assessed      Oral Motor/Sensory Function   Overall Oral Motor/Sensory Function  Appears within functional limits for  tasks assessed   deferred due to masking policy     Motor Speech   Overall Motor Speech  Impaired at baseline   due to endentulous status                     SLP Education - 12/08/18 1652    Education Details  HEP procedure, late effects radiation on swallowing    Person(s) Educated  Patient    Methods  Explanation;Demonstration;Verbal cues;Handout    Comprehension  Verbalized understanding;Returned demonstration;Verbal cues required;Need further instruction       SLP Short Term Goals - 12/08/18 1656      SLP SHORT TERM GOAL #1   Title  pt will complete HEP with occasional min A    Time  1    Period  --   visit, for all STGs   Status  New      SLP SHORT TERM GOAL #2   Title  pt will tell SLP rationale for HEP    Time  1    Status  New       SLP Long Term Goals - 12/08/18 1657      SLP LONG TERM GOAL #1   Title  pt will complete HEP with rare min A    Time  3    Period  --   visits, for all LTGs   Status  New      SLP LONG TERM GOAL #2   Title  pt will tell SLP 3 overt s/sx of aspiration PNA with modifeid independence    Time  3    Status  New      SLP LONG TERM GOAL #3   Title  pt will tell SLP how a food journal can facilitate quicker return to more normalized diet than without    Time  3    Status  New       Plan - 12/08/18 1653    Clinical Impression Statement  Pt had WNL swallowing today with [place] lunchmeat and water. Data suggest swallowing ability will decr during treatment, and afterwards pt risk of aspiration may incr due to muscle fibrosis, if swallowing exercises are not followed. Pt will beneift from skilled ST to address/assess adequacy of compeltion of HEP, and safety with current POs.    Speech Therapy Frequency  --   approx once every 4 weeks   Duration  --   for 4 total visits (prior to 03-08-19)   Treatment/Interventions  Aspiration precaution training;Pharyngeal strengthening exercises;Diet toleration management by  SLP;Cueing hierarchy;Trials of upgraded texture/liquids;Compensatory techniques;Environmental controls;Internal/external aids;SLP instruction and feedback;Patient/family education    Potential to Achieve Goals  Good  Patient will benefit from skilled therapeutic intervention in order to improve the following deficits and impairments:   Dysphagia, unspecified type - Plan: SLP plan of care cert/re-cert    Problem List Patient Active Problem List   Diagnosis Date Noted  . Normocytic anemia 12/05/2018  . Cancer-related pain 12/05/2018  . 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 Critical lower limb ischemia 08/08/2018  . Diabetes mellitus type 2 in obese ([place]) 05/27/2018  . Acute on chronic pancreatitis ([place])   . Acute pancreatitis 05/26/2018  . Midfoot ulceration, right, with unspecified severity ([place]) 04/05/2018  . Acute pancreatitis with uninfected necrosis 04/01/2018  . Constipation 04/01/2018  . Recurrent epistaxis 05/31/2017  . Throat pain in adult 05/31/2017  . Tubular adenoma of colon 06/30/2016  . Diverticulosis of colon without hemorrhage 06/30/2016  . Postmenopausal vaginal bleeding   . Long term (current) use of opiate analgesic 04/09/2016  . Bilateral carotid artery disease ([place]) 11/28/2015  . Pancreatic insufficiency 05/09/2015  . 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 Risk for coronary artery disease greater than 20% in next 10 years 03/14/2015  . Diastolic dysfunction 99991111  . Vulvar cyst 01/23/2015  . Abnormal uterine bleeding 12/02/2014  . Unexplained night sweats 12/02/2014  . Constipation due to opioid therapy 06/05/2014  . Loss of weight 03/29/2014  . Peripheral arterial disease ([place]) 12/12/2013  . GERD (gastroesophageal reflux disease) 10/12/2012  . Healthcare maintenance 09/29/2012  . Back pain 03/28/2012  . Hyperlipidemia 04/01/2011  . BOILS, RECURRENT 03/13/2010  . Moderate aortic regurgitation 09/25/2008  .  HEMORRHOIDS 06/08/2008  . Osteoarthritis 05/06/2007  . Former tobacco use 09/27/2006  . History of osteoporosis 06/29/2006  . Chronic pancreatitis ([place]) 03/29/2006  . Uncontrolled type 2 diabetes mellitus with chronic kidney disease, without long-term current use of insulin ([place]) 03/15/2006  . Diabetic neuropathy, painful ([place]) 03/15/2006  . Anemia of chronic disease 03/15/2006  . Essential hypertension 03/15/2006  . VENTRICULAR HYPERTROPHY, LEFT 03/15/2006  . CKD stage 3 due to type 2 diabetes mellitus ([place]) 03/15/2006    [facility] ,[place], [place]  12/08/2018, 5:01 PM  [facility] [address] [place], [place], [address] Phone: [phone]   Fax:  [phone]  Name: SHAWDAE RIEBEN MRN: [id number] Date of Birth: 19-Apr-1950

## 2018-12-08 NOTE — Patient Instructions (Signed)
SWALLOWING EXERCISES Do these 6 of the 7 days per week until 6 months after your last day of radiation, then 2-3 times per week afterwards  1. Effortful Swallows - Press your tongue against the roof of your mouth for 3 seconds, then squeeze          the muscles in your neck while you swallow your saliva or a sip of water - Repeat 10-15 times, 2-3 times a day, and use whenever you eat or drink  2. Masako Swallow - swallow with your tongue sticking out - Stick tongue out past your teeth and gently bite tongue with your teeth - Swallow, while holding your tongue with your teeth - Repeat 10-15 times, 2-3 times a day *use a wet spoon if your mouth gets dry*  3. Pitch Raise - Repeat "he", once per second in as high of a pitch as you can - Repeat 20 times, 2-3 times a day  4. Mendelsohn Maneuver - "half swallow" exercise - Start to swallow, and keep your Adam's apple up by squeezing hard with the            muscles of the throat - Hold the squeeze for 5-7 seconds and then relax - Repeat 10-15 times, 2-3 times a day *use a wet spoon if your mouth gets dry*  5. Chin pushback - Open your mouth  - Place your fist UNDER your chin near your neck, and push back with your fist for 5 seconds - Repeat 10 times, 2-3 times a day        6.  Super swallow  - Take a breath and hold it  - bear down, swallow and cough  - repeat 10 times, twice a day  2

## 2018-12-09 ENCOUNTER — Other Ambulatory Visit: Payer: Self-pay | Admitting: Hematology

## 2018-12-09 ENCOUNTER — Ambulatory Visit
Admission: RE | Admit: 2018-12-09 | Discharge: 2018-12-09 | Disposition: A | Discharge status: Home / Self Care | Payer: Medicare Other | Source: Ambulatory Visit | Attending: Radiation Oncology | Admitting: Radiation Oncology

## 2018-12-09 ENCOUNTER — Ambulatory Visit (HOSPITAL_COMMUNITY): Admission: RE | Admit: 2018-12-09 | Payer: Medicare Other | Source: Ambulatory Visit

## 2018-12-09 ENCOUNTER — Ambulatory Visit: Payer: Medicare Other | Admitting: Radiation Oncology

## 2018-12-09 ENCOUNTER — Other Ambulatory Visit: Payer: Self-pay

## 2018-12-09 ENCOUNTER — Telehealth: Payer: Self-pay | Admitting: *Deleted

## 2018-12-09 DIAGNOSIS — G893 Neoplasm related pain (acute) (chronic): Secondary | ICD-10-CM

## 2018-12-09 DIAGNOSIS — C099 Malignant neoplasm of tonsil, unspecified: Secondary | ICD-10-CM

## 2018-12-09 MED ORDER — MORPHINE SULFATE 15 MG PO TABS: 15.0000 mg | ORAL_TABLET | Freq: Four times a day (QID) | ORAL | 0 refills | Status: DC | PRN

## 2018-12-09 MED ORDER — XARELTO 2.5 MG PO TABS: 2.5000 mg | ORAL_TABLET | Freq: Two times a day (BID) | ORAL | 0 refills | Status: DC

## 2018-12-09 NOTE — Telephone Encounter (Signed)
Oncology Nurse Navigator Documentation  Pt's dtr called to report pain 10/10 in L neck area of bx, radiating to L ear.  I informed her Dr. Maylon Peppers issued Rx for IR morphine to preferred pharmacy and guidance to arrive to ER over weekend if pain remains sever despite new Rx.  She voiced understanding and appreciation.  Gayleen Orem, RN, BSN Head & Neck Oncology Nurse [place] at [place] [phone]

## 2018-12-09 NOTE — Telephone Encounter (Signed)
Noted. Please inform the patient that I have sent a prescription for IR morphine 15mg  [id number] PRN for pain.  If her pain remains severe despite morphine over the weekend, she should go to the ER for further evaluation.  Dr. Maylon Peppers

## 2018-12-09 NOTE — Telephone Encounter (Signed)
Thanks.  Dr. Rush Salce 

## 2018-12-09 NOTE — Telephone Encounter (Signed)
Oncology Nurse Navigator Documentation  Rec'd call from Ms. Funk' dtr, Ivin Booty.  She indicated her mother is not coming to appts this afternoon (2:00 PEG ed. 3:00 CT SIM, pre-procedural COVID screen) because "she is in whole lot of pain".  Ivin Booty was unable to provide specific details, indicated her mother was not answering the phone, she learned of situation by going to her mother's apt.  I stressed the importance of her coming in to see a physician for evaluation of pain and adjustment of current analgesic, noted importance of radiation planning, COVID screen prior to 10/20 PEG/port placement.  I asked if she was able to go back to her mother's apt and relay my guidance to which she replied "I've got to get ready for my own apt at 1:00".  I indicated other option for having pain addressed is arrival to ED.  Called Ms. Romm but no answer.  Drs. Erasmo Downer and [facility] notified.  Gayleen Orem, RN, BSN Head & Neck Oncology Nurse [place] at [place] [phone]

## 2018-12-11 NOTE — Telephone Encounter (Signed)
Oncology Nurse Navigator Documentation  Called Ms. Meth to confirm understanding of Friday's 3:00 CT SIM and to inform her of 10/20 appt at [facility] for PEG/port placement.  She indicated she can arrange transportation for 0730 arrival.   She agreed to 2:00 arrival this Friday for PEG ed.  Spoke with dtr Ivin Booty who indicated she can pick her mother up s/p CT SIM to take her to [facility] for COVID screen, arrange to take her home s/p PEG/port placement.  Gayleen Orem, RN, BSN Head & Neck Oncology Nurse [place] at [place] [phone]

## 2018-12-12 ENCOUNTER — Inpatient Hospital Stay: Payer: Medicare Other | Admitting: Nutrition

## 2018-12-12 ENCOUNTER — Ambulatory Visit: Payer: Medicare Other | Admitting: Radiation Oncology

## 2018-12-12 ENCOUNTER — Telehealth: Payer: Self-pay | Admitting: *Deleted

## 2018-12-12 NOTE — Progress Notes (Signed)
I contacted patient by telephone for nutrition consult.  I was unable to speak with patient.  I could not leave a message.

## 2018-12-12 NOTE — Telephone Encounter (Addendum)
Oncology Nurse Navigator Documentation  Called [name] to check on her well-being s/p Friday's appt cancellations d/t increased pain and subsequent Rx from Dr. Maylon Peppers; to reschedule CT SIM, PEG ed d PEG/port placement appts.  No answer/unable to LVM at her number, LVMM on dtr Sharon's phone requesting she call me.  Drs. Isidore Moos and Maylon Peppers updated.  Gayleen Orem, RN, BSN Head & Neck Oncology Nurse [place] at [place] [phone]

## 2018-12-13 ENCOUNTER — Other Ambulatory Visit: Payer: Self-pay

## 2018-12-13 ENCOUNTER — Encounter (HOSPITAL_COMMUNITY)
Admission: RE | Admit: 2018-12-13 | Discharge: 2018-12-13 | Disposition: A | Discharge status: Home / Self Care | Payer: Medicare Other | Source: Ambulatory Visit | Attending: Nephrology | Admitting: Nephrology

## 2018-12-13 ENCOUNTER — Telehealth: Payer: Self-pay | Admitting: *Deleted

## 2018-12-13 ENCOUNTER — Ambulatory Visit (HOSPITAL_COMMUNITY): Admission: RE | Admit: 2018-12-13 | Payer: Medicare Other | Source: Ambulatory Visit

## 2018-12-13 ENCOUNTER — Ambulatory Visit (HOSPITAL_COMMUNITY): Payer: Medicare Other

## 2018-12-13 VITALS — BP 146/56 | HR 62 | Temp 96.4°F | Resp 18

## 2018-12-13 DIAGNOSIS — N183 Chronic kidney disease, stage 3 unspecified: Secondary | ICD-10-CM | POA: Diagnosis present

## 2018-12-13 DIAGNOSIS — D638 Anemia in other chronic diseases classified elsewhere: Secondary | ICD-10-CM

## 2018-12-13 DIAGNOSIS — D631 Anemia in chronic kidney disease: Secondary | ICD-10-CM | POA: Insufficient documentation

## 2018-12-13 MED ORDER — EPOETIN ALFA 10000 UNIT/ML IJ SOLN
INTRAMUSCULAR | Status: AC
  Administered 2018-12-13: 10000 [IU] via SUBCUTANEOUS
  Filled 2018-12-13: qty 1

## 2018-12-13 MED ORDER — EPOETIN ALFA 10000 UNIT/ML IJ SOLN: 10000.0000 [IU] | INTRAMUSCULAR | Status: DC

## 2018-12-13 NOTE — Telephone Encounter (Signed)
Meant chemo without port.

## 2018-12-13 NOTE — Telephone Encounter (Signed)
Do you mean chemo before port?  Joiner

## 2018-12-13 NOTE — Telephone Encounter (Signed)
If the patient has reasonable peripheral access, we can try. I recall that the patient was fairly thin, so her peripheral access may be questionable.   Dr. Maylon Peppers

## 2018-12-13 NOTE — Telephone Encounter (Signed)
Oncology Nurse Navigator Documentation  Ms. [place]' dtr Ivin Booty returned my call yesterday inquiring about mother's well being since beginning Rx for MSIR 15 mg issued by Dr. Maylon Peppers last Friday.   She indicated her mother:  Reports L throat pain still 10/10 despite taking q 6 hrs as prescribed, MSIR causing nausea for which she is taking Zofran with some relief.    Has stated she does not want to proceed with chemoRT.  Will not call me d/t throat soreness, will not come in to be seen for further evaluation. I stressed the urgency of being seen by physician.  Ivin Booty replied she will again encourage her to come in, agreed to call me with further update.  Drs. Maylon Peppers and [place] updated.  Gayleen Orem, RN, BSN Head & Neck Oncology Nurse [place] at [place] [phone]

## 2018-12-14 ENCOUNTER — Inpatient Hospital Stay: Payer: Medicare Other

## 2018-12-14 ENCOUNTER — Inpatient Hospital Stay: Payer: Medicare Other | Admitting: Hematology

## 2018-12-14 ENCOUNTER — Telehealth: Payer: Self-pay | Admitting: *Deleted

## 2018-12-14 ENCOUNTER — Other Ambulatory Visit: Payer: Self-pay | Admitting: Hematology

## 2018-12-14 DIAGNOSIS — G893 Neoplasm related pain (acute) (chronic): Secondary | ICD-10-CM

## 2018-12-14 DIAGNOSIS — C099 Malignant neoplasm of tonsil, unspecified: Secondary | ICD-10-CM

## 2018-12-14 LAB — POCT HEMOGLOBIN-HEMACUE: Hemoglobin: 10.3 g/dL — ABNORMAL LOW (ref 12.0–15.0)

## 2018-12-14 MED ORDER — MORPHINE SULFATE 15 MG PO TABS: 15.0000 mg | ORAL_TABLET | Freq: Four times a day (QID) | ORAL | 0 refills | Status: DC | PRN

## 2018-12-14 NOTE — Telephone Encounter (Signed)
Oncology Nurse Navigator Documentation  Returned call to patient dtr Ivin Booty.  Informed/asked per Dr. Salem Senate:  She has record of taking Norco.  Was that effective in controlling pain?  If not, he will Rx oxycodone.  He has 2:00 appt next Wed and can see her to discuss care plan.  He strongly encourages her to come in.  He is placing referral to Dr. Maryjean Ka for pain mgt.  If she does not want to proceed with tmt, a referral to palliative care should be considered. Ivin Booty voiced understanding, indicated she is enroute to her mothers appt, will call me back ASAP.  Gayleen Orem, RN, BSN Head & Neck Oncology Nurse [place] at [place] [phone]

## 2018-12-15 ENCOUNTER — Inpatient Hospital Stay: Payer: Medicare Other

## 2018-12-15 ENCOUNTER — Other Ambulatory Visit: Payer: Self-pay | Admitting: Hematology

## 2018-12-15 ENCOUNTER — Telehealth: Payer: Self-pay | Admitting: *Deleted

## 2018-12-15 DIAGNOSIS — C099 Malignant neoplasm of tonsil, unspecified: Secondary | ICD-10-CM

## 2018-12-15 DIAGNOSIS — G893 Neoplasm related pain (acute) (chronic): Secondary | ICD-10-CM

## 2018-12-15 NOTE — Telephone Encounter (Signed)
As the patient has not been able to tolerate morphine and oxycodone, and has no improvement with Norco, there are limited options. Furthermore, as the patient is refusing to come to clinic for evaluation, it is difficult to determine how best to manage her pain. I would recommend her to go to the ER for further evaluation.   In addition, I have placed referral to the pain management with Dr. Maryjean Ka.  Thanks.  Dr. Maylon Peppers

## 2018-12-15 NOTE — Telephone Encounter (Signed)
Oncology Nurse Navigator Documentation  Received call-back from pt's dtr Ivin Booty.  She indicated her mother: 1. Has taken Norco in the past but it does not mitigate pain. 2. Cannot take oxycodone d/t "my liver". 3. Refuses to see Dr. Maylon Peppers next Wed for evaluation. I stated I would update Dr. Maylon Peppers, encouraged trip to ER as needed.  She voiced understanding.  Gayleen Orem, RN, BSN Head & Neck Oncology Nurse [place] at [place] [phone]

## 2018-12-15 NOTE — Telephone Encounter (Signed)
Oncology Nurse Navigator Documentation  I will share guidance again.  Gayleen Orem, RN, BSN Head & Neck Oncology Nurse [place] at [place] [phone]

## 2018-12-16 ENCOUNTER — Telehealth: Payer: Self-pay | Admitting: *Deleted

## 2018-12-16 NOTE — Telephone Encounter (Signed)
Oncology Nurse Navigator Documentation  Spoke with pt's dtr Ivin Booty.  Reiterated Dr. Lorette Ang guidance to arrive to ER for assessment/mgt of pain.  Again asked her to encourage her mother to arrive to 10/28 appt with Dr. Maylon Peppers.  Dtr voiced understanding.  Gayleen Orem, RN, BSN Head & Neck Oncology Nurse [place] at [place] [phone]

## 2018-12-19 ENCOUNTER — Telehealth: Payer: Self-pay

## 2018-12-19 ENCOUNTER — Telehealth: Payer: Self-pay | Admitting: *Deleted

## 2018-12-19 NOTE — Telephone Encounter (Signed)
Received message to call Clinical navigator with Dr. Lorette Ang office. Spoke with Gayleen Orem RN regarding referral to Palliative care and need for symptom management. Will reach out to patient/daughter to schedule visit.

## 2018-12-19 NOTE — Telephone Encounter (Signed)
Oncology Nurse Navigator Documentation  In follow-up to call received earlier from [facility] with [facility] re referral, received call from [facility].  I provided overview of recent efforts to assist Ms. Naab, acknowledged difficulties in providing care b/c she will not speak with [place] directly, only through dtr [place].  Inez Catalina indicated NP will reach out to Ms. Mehra for initial telehealth, communicate update to me as able.  Gayleen Orem, RN, BSN Head & Neck Oncology Nurse [place] at [place] [phone]

## 2018-12-20 ENCOUNTER — Telehealth: Payer: Self-pay

## 2018-12-20 NOTE — Telephone Encounter (Signed)
Phone call placed to patient's daughter, Ivin Booty, to introduce Palliative Care and to schedule a visit with NP. VM left with call back information

## 2018-12-21 ENCOUNTER — Telehealth: Payer: Self-pay | Admitting: Hospice

## 2018-12-21 ENCOUNTER — Other Ambulatory Visit: Payer: Medicare Other

## 2018-12-21 ENCOUNTER — Ambulatory Visit: Payer: Medicare Other | Admitting: Hematology

## 2018-12-21 NOTE — Telephone Encounter (Signed)
Phone call placed to patient's daughter, Ivin Booty, to introduce Palliative Care and to schedule a visit with NP. She will discuss with her Mom and call us back.

## 2018-12-22 ENCOUNTER — Ambulatory Visit: Payer: Medicare Other

## 2018-12-26 ENCOUNTER — Telehealth: Payer: Self-pay

## 2018-12-26 NOTE — Telephone Encounter (Signed)
Attempted to reach patient's daughter. Phone rang with no answer.

## 2018-12-26 NOTE — Telephone Encounter (Signed)
Received message to call patient's daughter, Ivin Booty, back. Phone call placed x 2. Unable to leave message, will attempt again.

## 2018-12-27 ENCOUNTER — Other Ambulatory Visit: Payer: Self-pay

## 2018-12-27 ENCOUNTER — Ambulatory Visit (HOSPITAL_COMMUNITY)
Admission: RE | Admit: 2018-12-27 | Discharge: 2018-12-27 | Disposition: A | Discharge status: Home / Self Care | Payer: Medicare Other | Source: Ambulatory Visit | Attending: Nephrology | Admitting: Nephrology

## 2018-12-27 VITALS — BP 115/54 | HR 75 | Temp 97.8°F | Resp 18

## 2018-12-27 DIAGNOSIS — D638 Anemia in other chronic diseases classified elsewhere: Secondary | ICD-10-CM | POA: Diagnosis present

## 2018-12-27 LAB — IRON AND TIBC
Iron: 38 ug/dL (ref 28–170)
Saturation Ratios: 16 % (ref 10.4–31.8)
TIBC: 241 ug/dL — ABNORMAL LOW (ref 250–450)
UIBC: 203 ug/dL

## 2018-12-27 LAB — POCT HEMOGLOBIN-HEMACUE: Hemoglobin: 10.7 g/dL — ABNORMAL LOW (ref 12.0–15.0)

## 2018-12-27 LAB — FERRITIN: Ferritin: 65 ng/mL (ref 11–307)

## 2018-12-27 MED ORDER — EPOETIN ALFA 10000 UNIT/ML IJ SOLN
INTRAMUSCULAR | Status: AC
  Administered 2018-12-27: 10000 [IU] via SUBCUTANEOUS
  Filled 2018-12-27: qty 1

## 2018-12-27 MED ORDER — EPOETIN ALFA 10000 UNIT/ML IJ SOLN
10000.0000 [IU] | INTRAMUSCULAR | Status: DC
  Administered 2018-12-27: 14:00:00 10000 [IU] via SUBCUTANEOUS

## 2018-12-28 ENCOUNTER — Telehealth: Payer: Self-pay

## 2018-12-28 ENCOUNTER — Other Ambulatory Visit: Payer: Medicare Other

## 2018-12-28 ENCOUNTER — Other Ambulatory Visit: Payer: Medicare Other | Admitting: Hospice

## 2018-12-28 ENCOUNTER — Other Ambulatory Visit: Payer: Self-pay

## 2018-12-28 ENCOUNTER — Ambulatory Visit: Payer: Medicare Other | Admitting: Hematology

## 2018-12-28 DIAGNOSIS — Z515 Encounter for palliative care: Secondary | ICD-10-CM

## 2018-12-28 NOTE — Telephone Encounter (Signed)
Spoke with Ivin Booty, patient's daughter.Ivin Booty shared that patient had an increased in pain last night and would like a visit with Palliative Care. Ivin Booty noted that patient is very private and is requesting an initial visit via telephone.Verbal consent obtained. Visit scheduled for today. Clinical navigator with Dr. Lorette Ang office notified.

## 2018-12-28 NOTE — Telephone Encounter (Signed)
Received message to call patient's daughter, Ivin Booty. Phone call placed. Message left with call back information.

## 2018-12-28 NOTE — Progress Notes (Addendum)
[facility] Pall[facility] Care Consult Note Telephone: [phone]  Fax: [phone]  PATIENT NAME: [name] DOB: Jul 25, 1950 MRN: [id number]  PRIMARY CARE PROVIDER:   Nolene Ebbs, MD  REFERRING PROVIDER: Dr Tish Men RESPONSIBLE PARTY:   Self Contact: Daughter, Lacosta Hargan [phone]  TELEHEALTH VISIT STATEMENT Due to the COVID-19 crisis, this visit was done via telephone from my office. It was initiated and consented to by this patient and/or family.  RECOMMENDATIONS/PLAN:   Advance Care Planning/Goals of Care: Telehealth Visit consisted of building trust and discussions on Palliative Medicine as specialized medical care for people living with serious illness, aimed at facilitating advance care plan, symptoms relief and establishing goals of care. Patient affirmed she is a DNR; signed copy put in the mail per her request; will upload into Epic. Goals of care include to maximize quality of life and symptom management.  Symptom management: Severe pain related to squamous cell carcinoma of left tonsil. Patient said she has Morphine IR 70m on hand and she takes it once a day but sometimes she takes up to two times but this is not managing her pain well and she plans to go to ER for pain. Looking through her chart, education provided to patient that her Morphine can be taken every 6 hours as needed for pain. She verbalized understanding and expressed gratitude.   I recommended for a start, patient be placed on Morphine Sulphate ER 20 mg daily, Hydrocodone 5/3263mevery 6 hours PRN breakthrough pain; to continue on Linzess as currently ordered.  Follow up: Palliative care will continue to follow patient for goals of care clarification and symptom management  I spent 60 minutes providing this consultation, from 11am to 12pm. More than 50% of the time in this consultation was spent on coordinating communication.  HISTORY OF PRESENT ILLNESS:  Darolyn C Corsi  is a [age].o. year old female with multiple medical problems including Squamous cell carcinoma lf left tonsil, Type 2 DM, PAD, Pancreatitis.  Palliative Care was asked to help address goals of care.   CODE STATUS: DNR  PPS: 60% HOSPICE ELIGIBILITY/DIAGNOSIS: TBD  PAST MEDICAL HISTORY:  Past Medical History:  Diagnosis Date  . ANEMIA, CHRONIC DISEASE NEC 03/15/2006  . Carotid artery occlusion   . [place] ([place]  . Colon polyps   . Constipation due to opioid therapy 06/05/2014  . DEGENERATIVE JOINT DISEASE 05/06/2007   On going for >5 yrs No imaging to confirm Has tried ultram, mobic, neurontin which did not work [facility] worked well    . Diabetes mellitus    Type II  . DIABETIC  RETINOPATHY 03/15/2006  . 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 Diastolic dysfunction 1137/90/2409 Grade 1 by Echocardiogram 12/13/14  . Elevated liver enzymes   . Heart murmur   . Hemorrhoids   . History of kidney stones   . Hyperlipidemia 04/01/2011  . Hypertension   . Mild AI (aortic insufficiency)   . Moderate aortic regurgitation 09/25/2008   12/13/14 Echocardiogram    . Myalgia   . [place] disease) ([place]  . PANCREATITIS, CHRONIC 03/29/2006  . [place]  . Pneumonia    age 89-20. RENAL INSUFFICIENCY, CHRONIC 03/15/2006  . Tonsillar mass    Left    SOCIAL HX:  Social History   Tobacco Use  . Smoking status: Former Smoker    Packs/day: 0.30    Years: 44.00  Pack years: 13.20    Types: Cigarettes    Quit date: 03/26/2016    Years since quitting: 2.7  . Smokeless tobacco: Never Used  Substance Use Topics  . Alcohol use: Not Currently    Alcohol/week: 0.0 standard drinks    Comment: "i used to be a heavy drinker"    ALLERGIES:  Allergies  Allergen Reactions  . Atorvastatin Other (See Comments)    Caused muscles in her back to be very sore  . Gabapentin Swelling    Legs and feet swell  . Contrast Media [Iodinated Diagnostic  Agents] Rash  . Nicorette [Nicotine] Nausea Only     PERTINENT MEDICATIONS:  Outpatient Encounter Medications as of 12/28/2018  Medication Sig  . ACCU-CHEK AVIVA PLUS test strip TEST three times a day  . amLODipine (NORVASC) 10 MG tablet Take 10 mg by mouth daily.  Marland Kitchen aspirin EC 81 MG tablet Take 1 tablet (81 mg total) by mouth daily.  . Blood Glucose Monitoring Suppl (ACCU-CHEK AVIVA PLUS) w/Device KIT Use to check you blood sugar 3 times a day  . dexamethasone (DECADRON) 4 MG tablet Take 2 tablets (8 mg total) by mouth daily. Start the day after chemotherapy for 2 days. Take with food.  Marland Kitchen HYDROcodone-acetaminophen (NORCO/VICODIN) 5-325 MG tablet Take by mouth.  . lidocaine-prilocaine (EMLA) cream Apply to affected area once  . LINZESS 145 MCG CAPS capsule Take 145 mcg by mouth daily.   Marland Kitchen morphine (MSIR) 15 MG tablet Take 1 tablet (15 mg total) by mouth every 6 (six) hours as needed for severe pain.  Marland Kitchen omeprazole (PRILOSEC) 40 MG capsule Take 40 mg by mouth daily.  . ondansetron (ZOFRAN) 8 MG tablet Take 1 tablet (8 mg total) by mouth 2 (two) times daily as needed for refractory nausea / vomiting. Start on day 3 after chemotherapy.  . Pancrelipase, Lip-Prot-Amyl, 24000-76000 units CPEP Take 4 capsules (96,000 Units total) by mouth See admin instructions. Take 4 capsules by mouth 2-3 times daily before meals (Patient taking differently: Take 2-4 capsules by mouth See admin instructions. Take 4 capsules by mouth two to three times a day with meals and 2 capsules with any snacks)  . PAZEO 0.7 % SOLN INT 1 GTT IN OU QPM  . pregabalin (LYRICA) 75 MG capsule Take 1 capsule (75 mg total) by mouth daily.  Marland Kitchen PROAIR HFA 108 (90 Base) MCG/ACT inhaler Inhale 2 puffs into the lungs 4 (four) times daily as needed for wheezing or shortness of breath.  . prochlorperazine (COMPAZINE) 10 MG tablet Take 1 tablet (10 mg total) by mouth every 6 (six) hours as needed (Nausea or vomiting).  . rivaroxaban (XARELTO) 2.5  MG TABS tablet Take 1 tablet (2.5 mg total) by mouth 2 (two) times daily.  . rosuvastatin (CRESTOR) 20 MG tablet TAKE 1 TABLET BY MOUTH EVERY DAY (Patient taking differently: Take 20 mg by mouth daily. )  . silver sulfADIAZINE (SILVADENE) 1 % cream Apply pea-sized amount to wound daily.  . sodium chloride (MURO 128) 5 % ophthalmic solution   . sucralfate (CARAFATE) 1 g tablet Take 1 tablet (1 g total) by mouth 3 (three) times daily after meals.  Tyler Aas FLEXTOUCH 100 UNIT/ML SOPN FlexTouch Pen Inject 20 Units into the skin every morning.    No facility-administered encounter medications on file as of 12/28/2018.     Teodoro Spray, NP

## 2018-12-29 ENCOUNTER — Ambulatory Visit: Payer: Medicare Other

## 2018-12-29 ENCOUNTER — Ambulatory Visit: Payer: Medicare Other | Admitting: Podiatry

## 2019-01-02 ENCOUNTER — Encounter: Payer: Self-pay | Admitting: Radiation Oncology

## 2019-01-02 ENCOUNTER — Telehealth: Payer: Self-pay | Admitting: *Deleted

## 2019-01-02 NOTE — Telephone Encounter (Signed)
A user error has taken place: encounter opened in error, closed for administrative reasons.

## 2019-01-02 NOTE — Progress Notes (Signed)
I called the patient but her number was out of service.  I then called the patient's daughter to revisit whether the patient would be open to a conversation and considering treatment.  I feel that it would be very unfortunate if we do not attempt to cure her cancer (we could even give her a less aggressive , modified regimen to reduce side effects).  I believe that if we can have one more conversation I may be able to build trust with the patient and can move forward in a positive and constructive way.  I asked the patient's daughter to give a call back and to ask for me at the cancer center.  I hope to hear back from her soon.  -----------------------------------  Eppie Gibson, MD

## 2019-01-04 ENCOUNTER — Other Ambulatory Visit: Payer: Medicare Other

## 2019-01-04 ENCOUNTER — Ambulatory Visit: Payer: Medicare Other | Admitting: Hematology

## 2019-01-05 ENCOUNTER — Telehealth: Payer: Self-pay | Admitting: *Deleted

## 2019-01-05 ENCOUNTER — Ambulatory Visit: Payer: Medicare Other

## 2019-01-05 ENCOUNTER — Telehealth: Payer: Self-pay

## 2019-01-05 NOTE — Telephone Encounter (Signed)
Received message from daughter, Ivin Booty, regarding completed and notarized living will and POA form. Ivin Booty inquiring of where she should send these. Will follow up with Palliative NP.

## 2019-01-05 NOTE — Telephone Encounter (Signed)
Oncology Nurse Navigator Documentation  Rec'd call from Ms. Obando' dtr Ivin Booty in follow-up to VMM from Dr. Isidore Moos.  She stated her mother still does not want to proceed with RT.  I thanked her for calling, acknowledged her mother's decision.  I asked that she call again tomorrow so she can speak directly with Dr. Isidore Moos per her request, provided her Med Sec Shirley's phone number to facilitate call.  Gayleen Orem, RN, BSN Head & Neck Oncology Nurse [place] at [place] [phone]

## 2019-01-06 ENCOUNTER — Other Ambulatory Visit: Payer: Medicare Other | Admitting: Hospice

## 2019-01-06 ENCOUNTER — Other Ambulatory Visit: Payer: Self-pay

## 2019-01-06 DIAGNOSIS — C099 Malignant neoplasm of tonsil, unspecified: Secondary | ICD-10-CM

## 2019-01-06 DIAGNOSIS — Z515 Encounter for palliative care: Secondary | ICD-10-CM

## 2019-01-06 NOTE — Progress Notes (Signed)
[facility] Pall[facility] Consult Note Telephone: [phone]  Fax: [phone]  PATIENT NAME: [name] DOB: Nov 19, 1950 MRN: [id number]  PRIMARY CARE PROVIDER:   Nolene Ebbs, MD  REFERRING PROVIDER: Dr Tish Men RESPONSIBLE PARTY:   [id number] Contact: Daughter, Eleisha Branscomb [phone]  TELEHEALTH VISIT STATEMENT Due to the COVID-19 crisis, this visit was done via telephone from my office. It was initiated and consented to by this patient and/or family.  RECOMMENDATIONS and PLAN:  Advance Care Plan: Telehealth visit to build tust and discuss goals of care clarification. Discussed Dr Dionisio David recommendation of a less aggressive modified treatment regimen of radiation instead of chemotherapy, to help reduce side effects of treatment. Patient said she watched her late sister go through radiation and it was futile. She said she was told without radiation, she would not be able to eat/swallow but she is able to at this time. She said she has made up her mind and would not pursue treatment at this time.  Therapeutic listening, emotional support and validation provided. Follow up: Palliative care will continue to follow patient for goals of care clarification and symptom management I spent  minutes providing this consultation, from 11am to 11.30am. More than 50% of the time in this consultation was spent on coordinating communication.  HISTORY OF PRESENT ILLNESS:  [name] is a 68 y.o. year old female with multiple medical problems including Squamous cell carcinoma lf left tonsil, Type 2 DM, PAD, Pancreatitis.  Palliative Care was asked to help address goals of care.  CODE STATUS: DNR  PPS: 60% HOSPICE ELIGIBILITY/DIAGNOSIS: TBD  PAST MEDICAL HISTORY:  Past Medical History:  Diagnosis Date  . ANEMIA, CHRONIC DISEASE NEC 03/15/2006  . Carotid artery occlusion   . Cirrhosis ([place])   . Colon polyps   . Constipation due to opioid therapy  06/05/2014  . DEGENERATIVE JOINT DISEASE 05/06/2007   On going for >5 yrs No imaging to confirm Has tried ultram, mobic, neurontin which did not work Ambulance person tried once worked well    . Diabetes mellitus    Type II  . DIABETIC  RETINOPATHY 03/15/2006  . 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 Diastolic dysfunction 12/17/8525   Grade 1 by Echocardiogram 12/13/14  . Elevated liver enzymes   . Heart murmur   . Hemorrhoids   . History of kidney stones   . Hyperlipidemia 04/01/2011  . Hypertension   . Mild AI (aortic insufficiency)   . Moderate aortic regurgitation 09/25/2008   12/13/14 Echocardiogram    . Myalgia   . PAD (peripheral artery disease) ([place])   . PANCREATITIS, CHRONIC 03/29/2006  . Peripheral arterial disease ([place])   . Pneumonia    age 56-20  . RENAL INSUFFICIENCY, CHRONIC 03/15/2006  . Tonsillar mass    Left    SOCIAL HX:  Social History   Tobacco Use  . Smoking status: Former Smoker    Packs/day: 0.30    Years: 44.00    Pack years: 13.20    Types: Cigarettes    Quit date: 03/26/2016    Years since quitting: 2.7  . Smokeless tobacco: Never Used  Substance Use Topics  . Alcohol use: Not Currently    Alcohol/week: 0.0 standard drinks    Comment: "i used to be a heavy drinker"    ALLERGIES:  Allergies  Allergen Reactions  . Atorvastatin Other (See Comments)    Caused muscles in her back to be very sore  . Gabapentin Swelling  Legs and feet swell  . Contrast Media [Iodinated Diagnostic Agents] Rash  . Nicorette [Nicotine] Nausea Only     PERTINENT MEDICATIONS:  Outpatient Encounter Medications as of 01/06/2019  Medication Sig  . ACCU-CHEK AVIVA PLUS test strip TEST three times a day  . amLODipine (NORVASC) 10 MG tablet Take 10 mg by mouth daily.  Marland Kitchen aspirin EC 81 MG tablet Take 1 tablet (81 mg total) by mouth daily.  . Blood Glucose Monitoring Suppl (ACCU-CHEK AVIVA PLUS) w/Device KIT Use to check you blood sugar 3 times a day  . dexamethasone (DECADRON)  4 MG tablet Take 2 tablets (8 mg total) by mouth daily. Start the day after chemotherapy for 2 days. Take with food.  Marland Kitchen HYDROcodone-acetaminophen (NORCO/VICODIN) 5-325 MG tablet Take by mouth.  . lidocaine-prilocaine (EMLA) cream Apply to affected area once  . LINZESS 145 MCG CAPS capsule Take 145 mcg by mouth daily.   Marland Kitchen morphine (MSIR) 15 MG tablet Take 1 tablet (15 mg total) by mouth every 6 (six) hours as needed for severe pain.  Marland Kitchen omeprazole (PRILOSEC) 40 MG capsule Take 40 mg by mouth daily.  . ondansetron (ZOFRAN) 8 MG tablet Take 1 tablet (8 mg total) by mouth 2 (two) times daily as needed for refractory nausea / vomiting. Start on day 3 after chemotherapy.  . Pancrelipase, Lip-Prot-Amyl, 24000-76000 units CPEP Take 4 capsules (96,000 Units total) by mouth See admin instructions. Take 4 capsules by mouth 2-3 times daily before meals (Patient taking differently: Take 2-4 capsules by mouth See admin instructions. Take 4 capsules by mouth two to three times a day with meals and 2 capsules with any snacks)  . PAZEO 0.7 % SOLN INT 1 GTT IN OU QPM  . pregabalin (LYRICA) 75 MG capsule Take 1 capsule (75 mg total) by mouth daily.  Marland Kitchen PROAIR HFA 108 (90 Base) MCG/ACT inhaler Inhale 2 puffs into the lungs 4 (four) times daily as needed for wheezing or shortness of breath.  . prochlorperazine (COMPAZINE) 10 MG tablet Take 1 tablet (10 mg total) by mouth every 6 (six) hours as needed (Nausea or vomiting).  . rivaroxaban (XARELTO) 2.5 MG TABS tablet Take 1 tablet (2.5 mg total) by mouth 2 (two) times daily.  . rosuvastatin (CRESTOR) 20 MG tablet TAKE 1 TABLET BY MOUTH EVERY DAY (Patient taking differently: Take 20 mg by mouth daily. )  . silver sulfADIAZINE (SILVADENE) 1 % cream Apply pea-sized amount to wound daily.  . sodium chloride (MURO 128) 5 % ophthalmic solution   . sucralfate (CARAFATE) 1 g tablet Take 1 tablet (1 g total) by mouth 3 (three) times daily after meals.  Tyler Aas FLEXTOUCH 100  UNIT/ML SOPN FlexTouch Pen Inject 20 Units into the skin every morning.    No facility-administered encounter medications on file as of 01/06/2019.     Teodoro Spray, NP

## 2019-01-10 ENCOUNTER — Telehealth: Payer: Self-pay

## 2019-01-10 ENCOUNTER — Encounter (HOSPITAL_COMMUNITY)
Admission: RE | Admit: 2019-01-10 | Discharge: 2019-01-10 | Disposition: A | Discharge status: Home / Self Care | Payer: Medicare Other | Source: Ambulatory Visit | Attending: Nephrology | Admitting: Nephrology

## 2019-01-10 ENCOUNTER — Other Ambulatory Visit: Payer: Self-pay | Admitting: Cardiology

## 2019-01-10 ENCOUNTER — Other Ambulatory Visit: Payer: Medicare Other | Admitting: Hospice

## 2019-01-10 ENCOUNTER — Other Ambulatory Visit: Payer: Self-pay

## 2019-01-10 ENCOUNTER — Other Ambulatory Visit: Payer: Self-pay | Admitting: Internal Medicine

## 2019-01-10 VITALS — BP 123/58 | HR 75 | Temp 97.3°F | Resp 18

## 2019-01-10 DIAGNOSIS — D638 Anemia in other chronic diseases classified elsewhere: Secondary | ICD-10-CM | POA: Diagnosis present

## 2019-01-10 DIAGNOSIS — Z515 Encounter for palliative care: Secondary | ICD-10-CM

## 2019-01-10 DIAGNOSIS — C099 Malignant neoplasm of tonsil, unspecified: Secondary | ICD-10-CM

## 2019-01-10 LAB — POCT HEMOGLOBIN-HEMACUE: Hemoglobin: 10.5 g/dL — ABNORMAL LOW (ref 12.0–15.0)

## 2019-01-10 MED ORDER — EPOETIN ALFA 10000 UNIT/ML IJ SOLN
10000.0000 [IU] | INTRAMUSCULAR | Status: DC
  Administered 2019-01-10: 10000 [IU] via SUBCUTANEOUS

## 2019-01-10 MED ORDER — EPOETIN ALFA 10000 UNIT/ML IJ SOLN
INTRAMUSCULAR | Status: AC
  Filled 2019-01-10: qty 1

## 2019-01-10 NOTE — Telephone Encounter (Signed)
Received message from daughter, [name], who reported that patient is still having a lot of pain and inquiring if she can have anything more for pain. Daughter made aware that this RN will contact Livina NP for further direction

## 2019-01-10 NOTE — Telephone Encounter (Signed)
Inquired of daughter what medications were given for pain. Daughter stated that patient has taken morphine. Spoke with Windy Canny NP who will reach out to patient.

## 2019-01-10 NOTE — Progress Notes (Signed)
[facility] Pall[facility] Consult Note Telephone: [phone]  Fax: [phone]  PATIENT NAME: [name] DOB: 20-Mar-1950 MRN: [id number]  PRIMARY CARE PROVIDER:   Nolene Ebbs, MD  REFERRING PROVIDER:Dr Tish Men RESPONSIBLE PARTY:[id number] Contact: Daughter, Chrisette Man [phone]  TELEHEALTH VISIT STATEMENT Due to the COVID-19 crisis, this visit was done via telephone from my office. It was initiated and consented to by this patient and/or family      RECOMMENDATIONS/PLAN Advance Care Plan: Patient is a DNR. Willing to discuss Hospice services in the future on the condition that she will not have many people coming to her house. She said she is a private person. Goals of care include to maximize quality of life and symptom management. Symptom management:  Message received from [place] that patient's daughter - Ivin Booty-  reported patient was in severe pain. Ivin Booty was called;she said she is not with patient; patient has not taken her pain medication since last night. When patient was called she denied severe pain saying she has not taken her Morphine in a week because it makes her nauseous. She endorsed mild to moderate pain. Education provided on using prochlorperazine 66m 20 minutes before taking her Morphine, to help control the nausea. She can have Prochlorperazine every 6 hours as needed for nausea. She expressed understanding and appreciation. She said she would do as advised.  Follow uBM:WUXLKGMWNUcare will continue to follow patient for goals of care clarification and symptom management  I spent 30 minutes providing this consultation,  from 9.00am to 9.30am. More than 50% of the time in this consultation was spent coordinating communication.   HISTORY OF PRESENT ILLNESS:Kiara C Lawsis a 68y.o.year oldfemalewith multiple medical problems including Squamous cell carcinoma lf left tonsil, Type 2 DM,  PAD, Pancreatitis.Palliative Care was asked to help address goals of care.  CODE STATUS: DNR  PPS: 60% HOSPICE ELIGIBILITY/DIAGNOSIS: TBD  PAST MEDICAL HISTORY:  Past Medical History:  Diagnosis Date  . ANEMIA, CHRONIC DISEASE NEC 03/15/2006  . Carotid artery occlusion   . Cirrhosis ([facility]   . Colon polyps   . Constipation due to opioid therapy 06/05/2014  . DEGENERATIVE JOINT DISEASE 05/06/2007   On going for >5 yrs No imaging to confirm Has tried ultram, mobic, neurontin which did not work [facility] once worked well    . Diabetes mellitus    Type II  . DIABETIC  RETINOPATHY 03/15/2006  . 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 Diastolic dysfunction 127/25/3664  Grade 1 by Echocardiogram 12/13/14  . Elevated liver enzymes   . Heart murmur   . Hemorrhoids   . History of kidney stones   . Hyperlipidemia 04/01/2011  . Hypertension   . Mild AI (aortic insufficiency)   . Moderate aortic regurgitation 09/25/2008   12/13/14 Echocardiogram    . Myalgia   . P[place] (peripheral artery disease) ([facility]   . PANCREATITIS, CHRONIC 03/29/2006  . Peripheral arterial disease ([place]   . Pneumonia    age [age] . RENAL INSUFFICIENCY, CHRONIC 03/15/2006  . Tonsillar mass    Left    SOCIAL HX:  Social History   Tobacco Use  . Smoking status: Former Smoker    Packs/day: 0.30    Years: 44.00    Pack years: 13.20    Types: Cigarettes    Quit date: 03/26/2016    Years since quitting: 2.7  . Smokeless tobacco: Never Used  Substance Use Topics  . Alcohol use: Not  Currently    Alcohol/week: 0.0 standard drinks    Comment: "i used to be a heavy drinker"    ALLERGIES:  Allergies  Allergen Reactions  . Atorvastatin Other (See Comments)    Caused muscles in her back to be very sore  . Gabapentin Swelling    Legs and feet swell  . Contrast Media [Iodinated Diagnostic Agents] Rash  . Nicorette [Nicotine] Nausea Only     PERTINENT MEDICATIONS:  Outpatient Encounter Medications as of  01/10/2019  Medication Sig  . ACCU-CHEK AVIVA PLUS test strip TEST three times a day  . amLODipine (NORVASC) 10 MG tablet Take 10 mg by mouth daily.  Marland Kitchen aspirin EC 81 MG tablet Take 1 tablet (81 mg total) by mouth daily.  . Blood Glucose Monitoring Suppl (ACCU-CHEK AVIVA PLUS) w/Device KIT Use to check you blood sugar 3 times a day  . dexamethasone (DECADRON) 4 MG tablet Take 2 tablets (8 mg total) by mouth daily. Start the day after chemotherapy for 2 days. Take with food.  Marland Kitchen HYDROcodone-acetaminophen (NORCO/VICODIN) 5-325 MG tablet Take by mouth.  . lidocaine-prilocaine (EMLA) cream Apply to affected area once  . LINZESS 145 MCG CAPS capsule Take 145 mcg by mouth daily.   Marland Kitchen morphine (MSIR) 15 MG tablet Take 1 tablet (15 mg total) by mouth every 6 (six) hours as needed for severe pain.  Marland Kitchen omeprazole (PRILOSEC) 40 MG capsule Take 40 mg by mouth daily.  . ondansetron (ZOFRAN) 8 MG tablet Take 1 tablet (8 mg total) by mouth 2 (two) times daily as needed for refractory nausea / vomiting. Start on day 3 after chemotherapy.  . Pancrelipase, Lip-Prot-Amyl, 24000-76000 units CPEP Take 4 capsules (96,000 Units total) by mouth See admin instructions. Take 4 capsules by mouth 2-3 times daily before meals (Patient taking differently: Take 2-4 capsules by mouth See admin instructions. Take 4 capsules by mouth two to three times a day with meals and 2 capsules with any snacks)  . PAZEO 0.7 % SOLN INT 1 GTT IN OU QPM  . pregabalin (LYRICA) 75 MG capsule Take 1 capsule (75 mg total) by mouth daily.  Marland Kitchen PROAIR HFA 108 (90 Base) MCG/ACT inhaler Inhale 2 puffs into the lungs 4 (four) times daily as needed for wheezing or shortness of breath.  . prochlorperazine (COMPAZINE) 10 MG tablet Take 1 tablet (10 mg total) by mouth every 6 (six) hours as needed (Nausea or vomiting).  . rosuvastatin (CRESTOR) 20 MG tablet TAKE 1 TABLET BY MOUTH EVERY DAY (Patient taking differently: Take 20 mg by mouth daily. )  . silver  sulfADIAZINE (SILVADENE) 1 % cream Apply pea-sized amount to wound daily.  . sodium chloride (MURO 128) 5 % ophthalmic solution   . sucralfate (CARAFATE) 1 g tablet Take 1 tablet (1 g total) by mouth 3 (three) times daily after meals.  Tyler Aas FLEXTOUCH 100 UNIT/ML SOPN FlexTouch Pen Inject 20 Units into the skin every morning.   Alveda Reasons 2.5 MG TABS tablet TAKE 1 TABLET(2.5 MG) BY MOUTH TWICE DAILY   Facility-Administered Encounter Medications as of 01/10/2019  Medication  . epoetin alfa (EPOGEN) injection 10,000 Units    Teodoro Spray, NP

## 2019-01-11 ENCOUNTER — Other Ambulatory Visit: Payer: Medicare Other

## 2019-01-11 ENCOUNTER — Ambulatory Visit: Payer: Medicare Other | Admitting: Hematology

## 2019-01-12 ENCOUNTER — Ambulatory Visit: Payer: Medicare Other

## 2019-01-13 ENCOUNTER — Ambulatory Visit (INDEPENDENT_AMBULATORY_CARE_PROVIDER_SITE_OTHER): Payer: Medicare Other | Admitting: Podiatry

## 2019-01-13 DIAGNOSIS — Z5329 Procedure and treatment not carried out because of patient's decision for other reasons: Secondary | ICD-10-CM

## 2019-01-13 NOTE — Progress Notes (Signed)
No show for appt. 

## 2019-01-17 ENCOUNTER — Telehealth: Payer: Self-pay | Admitting: *Deleted

## 2019-01-17 NOTE — Telephone Encounter (Signed)
Oncology Nurse Navigator Documentation  Unable to reach pt by phone.  LVMM for dtr asking her to check with her mother to see if interested in keeping 12/3 follow-up appt with SLP Garald Balding, requested return call to update.  Gayleen Orem, RN, BSN Head & Neck Oncology Nurse [place] at [place] [phone]

## 2019-01-18 ENCOUNTER — Other Ambulatory Visit: Payer: Medicare Other

## 2019-01-18 ENCOUNTER — Ambulatory Visit: Payer: Medicare Other

## 2019-01-18 ENCOUNTER — Ambulatory Visit: Payer: Medicare Other | Admitting: Hematology

## 2019-01-23 ENCOUNTER — Telehealth: Payer: Self-pay | Admitting: *Deleted

## 2019-01-23 NOTE — Telephone Encounter (Signed)
Oncology Nurse Navigator Documentation  Spoke with Ms. Haddaway' dtr, Ivin Booty.  She confirmed her mother wishes to cancel Thursday's appt to see SLP Garald Balding, indicated she does not want to reschedule.    SLP Garald Balding and Neuro Rehab scheduler notified.  Gayleen Orem, RN, BSN Head & Neck Oncology Nurse [place] at [place] [phone]

## 2019-01-24 ENCOUNTER — Encounter (HOSPITAL_COMMUNITY)
Admission: RE | Admit: 2019-01-24 | Discharge: 2019-01-24 | Disposition: A | Discharge status: Home / Self Care | Payer: Medicare Other | Source: Ambulatory Visit | Attending: Nephrology | Admitting: Nephrology

## 2019-01-24 ENCOUNTER — Other Ambulatory Visit: Payer: Self-pay

## 2019-01-24 VITALS — BP 123/55 | HR 81 | Temp 97.2°F | Resp 16 | Ht 65.0 in

## 2019-01-24 DIAGNOSIS — D638 Anemia in other chronic diseases classified elsewhere: Secondary | ICD-10-CM | POA: Diagnosis not present

## 2019-01-24 LAB — IRON AND TIBC
Iron: 53 ug/dL (ref 28–170)
Saturation Ratios: 25 % (ref 10.4–31.8)
TIBC: 210 ug/dL — ABNORMAL LOW (ref 250–450)
UIBC: 157 ug/dL

## 2019-01-24 LAB — FERRITIN: Ferritin: 53 ng/mL (ref 11–307)

## 2019-01-24 MED ORDER — EPOETIN ALFA 10000 UNIT/ML IJ SOLN
10000.0000 [IU] | INTRAMUSCULAR | Status: DC
  Administered 2019-01-24: 10000 [IU] via SUBCUTANEOUS

## 2019-01-24 MED ORDER — EPOETIN ALFA 10000 UNIT/ML IJ SOLN
INTRAMUSCULAR | Status: AC
  Filled 2019-01-24: qty 1

## 2019-01-25 ENCOUNTER — Other Ambulatory Visit: Payer: Medicare Other

## 2019-01-25 ENCOUNTER — Ambulatory Visit: Payer: Medicare Other | Admitting: Hematology

## 2019-01-25 LAB — POCT HEMOGLOBIN-HEMACUE: Hemoglobin: 11.3 g/dL — ABNORMAL LOW (ref 12.0–15.0)

## 2019-01-26 ENCOUNTER — Ambulatory Visit: Payer: Medicare Other

## 2019-01-30 ENCOUNTER — Emergency Department (HOSPITAL_COMMUNITY)
Admission: EM | Admit: 2019-01-30 | Discharge: 2019-01-31 | Disposition: A | Discharge status: Home / Self Care | Payer: Medicare Other | Attending: Emergency Medicine | Admitting: Emergency Medicine

## 2019-01-30 ENCOUNTER — Other Ambulatory Visit: Payer: Self-pay

## 2019-01-30 ENCOUNTER — Encounter (HOSPITAL_COMMUNITY): Payer: Self-pay | Admitting: Emergency Medicine

## 2019-01-30 DIAGNOSIS — R42 Dizziness and giddiness: Secondary | ICD-10-CM | POA: Diagnosis present

## 2019-01-30 DIAGNOSIS — E119 Type 2 diabetes mellitus without complications: Secondary | ICD-10-CM | POA: Insufficient documentation

## 2019-01-30 DIAGNOSIS — I10 Essential (primary) hypertension: Secondary | ICD-10-CM | POA: Diagnosis not present

## 2019-01-30 DIAGNOSIS — E785 Hyperlipidemia, unspecified: Secondary | ICD-10-CM | POA: Insufficient documentation

## 2019-01-30 DIAGNOSIS — Z79899 Other long term (current) drug therapy: Secondary | ICD-10-CM | POA: Diagnosis not present

## 2019-01-30 LAB — COMPREHENSIVE METABOLIC PANEL
ALT: 20 U/L (ref 0–44)
AST: 23 U/L (ref 15–41)
Albumin: 2.4 g/dL — ABNORMAL LOW (ref 3.5–5.0)
Alkaline Phosphatase: 85 U/L (ref 38–126)
Anion gap: 9 (ref 5–15)
BUN: 41 mg/dL — ABNORMAL HIGH (ref 8–23)
CO2: 22 mmol/L (ref 22–32)
Calcium: 8.2 mg/dL — ABNORMAL LOW (ref 8.9–10.3)
Chloride: 105 mmol/L (ref 98–111)
Creatinine, Ser: 1.4 mg/dL — ABNORMAL HIGH (ref 0.44–1.00)
GFR calc Af Amer: 45 mL/min — ABNORMAL LOW (ref >60)
GFR calc non Af Amer: 39 mL/min — ABNORMAL LOW (ref >60)
Glucose, Bld: 117 mg/dL — ABNORMAL HIGH (ref 70–99)
Potassium: 3.9 mmol/L (ref 3.5–5.1)
Sodium: 136 mmol/L (ref 135–145)
Total Bilirubin: 0.6 mg/dL (ref 0.3–1.2)
Total Protein: 6.5 g/dL (ref 6.5–8.1)

## 2019-01-30 LAB — CBC WITH DIFFERENTIAL/PLATELET
Abs Immature Granulocytes: 0.02 10*3/uL (ref 0.00–0.07)
Basophils Absolute: 0 10*3/uL (ref 0.0–0.1)
Basophils Relative: 1 %
Eosinophils Absolute: 0.1 10*3/uL (ref 0.0–0.5)
Eosinophils Relative: 2 %
HCT: 34.1 % — ABNORMAL LOW (ref 36.0–46.0)
Hemoglobin: 10.4 g/dL — ABNORMAL LOW (ref 12.0–15.0)
Immature Granulocytes: 1 %
Lymphocytes Relative: 29 %
Lymphs Abs: 1.2 10*3/uL (ref 0.7–4.0)
MCH: 27.4 pg (ref 26.0–34.0)
MCHC: 30.5 g/dL (ref 30.0–36.0)
MCV: 89.7 fL (ref 80.0–100.0)
Monocytes Absolute: 0.4 10*3/uL (ref 0.1–1.0)
Monocytes Relative: 9 %
Neutro Abs: 2.4 10*3/uL (ref 1.7–7.7)
Neutrophils Relative %: 58 %
Platelets: 191 10*3/uL (ref 150–400)
RBC: 3.8 MIL/uL — ABNORMAL LOW (ref 3.87–5.11)
RDW: 14.7 % (ref 11.5–15.5)
WBC: 4.1 10*3/uL (ref 4.0–10.5)
nRBC: 0 % (ref 0.0–0.2)

## 2019-01-30 MED ORDER — SODIUM CHLORIDE 0.9 % IV BOLUS
500.0000 mL | Freq: Once | INTRAVENOUS | Status: AC
  Administered 2019-01-30: 500 mL via INTRAVENOUS

## 2019-01-30 NOTE — ED Notes (Signed)
Pt reports that she was still very dizzy when she stood up for orthostatic vitals

## 2019-01-30 NOTE — ED Triage Notes (Signed)
Pt arrived via EMS. Pt has hx of throat cancer and takes morphine for pain. Pt reports that she took 30mg  of morphine around 2pm for pain, and feels dizzy and sick from the morphine. EMS states that pt had orthostatic changes with EMS, SBP dropped from 120 to 90. Pt reports dizziness that gets worse when she stands.

## 2019-01-31 DIAGNOSIS — R42 Dizziness and giddiness: Secondary | ICD-10-CM | POA: Diagnosis not present

## 2019-01-31 MED ORDER — MECLIZINE HCL 25 MG PO TABS: 25.0000 mg | ORAL_TABLET | Freq: Three times a day (TID) | ORAL | 0 refills | Status: AC | PRN

## 2019-01-31 MED ORDER — MECLIZINE HCL 25 MG PO TABS
25.0000 mg | ORAL_TABLET | Freq: Once | ORAL | Status: AC
  Administered 2019-01-31: 25 mg via ORAL
  Filled 2019-01-31: qty 1

## 2019-01-31 NOTE — Discharge Instructions (Signed)
Please return for any problem.  Follow-up with your regular care provider as instructed. °

## 2019-01-31 NOTE — ED Provider Notes (Signed)
[facility] DEPT Provider Note   CSN: [id number] Arrival date & time: 01/30/19  2107     History   Chief Complaint Chief Complaint  Patient presents with  . Dizziness    HPI [name] is a 68 y.o. female.     68 year old female with prior medical history as detailed below presents for evaluation of dizziness.  Patient reports onset of dizziness earlier today.  She reports that her symptoms are worse with standing.  Sitting at rest she denies any vertiginous symptoms.  She denies associated nausea or vomiting.  She does report a prior history of mild vertigo which really resolved after taking meclizine.  She has not taken any meclizine today.  She otherwise denies visual change, focal weakness, chest pain, shortness of breath, fever, or other specific complaint.  The history is provided by the patient and medical records.  Dizziness Quality:  Vertigo Severity:  Mild Onset quality:  Gradual Duration:  1 day Timing:  Intermittent Progression:  Resolved Chronicity:  New Relieved by:  Nothing Worsened by:  Nothing   Past Medical History:  Diagnosis Date  . ANEMIA, CHRONIC DISEASE NEC 03/15/2006  . Carotid artery occlusion   . Cirrhosis ([place])   . Colon polyps   . Constipation due to opioid therapy 06/05/2014  . DEGENERATIVE JOINT DISEASE 05/06/2007   On going for >5 yrs No imaging to confirm Has tried ultram, mobic, neurontin which did not work Ambulance person tried once worked well    . Diabetes mellitus    Type II  . DIABETIC  RETINOPATHY 03/15/2006  . 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 Diastolic dysfunction 62/83/6629   Grade 1 by Echocardiogram 12/13/14  . Elevated liver enzymes   . Heart murmur   . Hemorrhoids   . History of kidney stones   . Hyperlipidemia 04/01/2011  . Hypertension   . Mild AI (aortic insufficiency)   . Moderate aortic regurgitation 09/25/2008   12/13/14 Echocardiogram    . Myalgia   . PAD (peripheral artery  disease) ([place])   . PANCREATITIS, CHRONIC 03/29/2006  . Peripheral arterial disease ([place])   . Pneumonia    age 45-20  . RENAL INSUFFICIENCY, CHRONIC 03/15/2006  . Tonsillar mass    Left    Patient Active Problem List   Diagnosis Date Noted  . Normocytic anemia 12/05/2018  . Cancer-related pain 12/05/2018  . 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 Critical lower limb ischemia 08/08/2018  . Diabetes mellitus type 2 in obese ([place]) 05/27/2018  . Acute on chronic pancreatitis ([place])   . Acute pancreatitis 05/26/2018  . Midfoot ulceration, right, with unspecified severity ([place]) 04/05/2018  . Acute pancreatitis with uninfected necrosis 04/01/2018  . Constipation 04/01/2018  . Recurrent epistaxis 05/31/2017  . Throat pain in adult 05/31/2017  . Tubular adenoma of colon 06/30/2016  . Diverticulosis of colon without hemorrhage 06/30/2016  . Postmenopausal vaginal bleeding   . Long term (current) use of opiate analgesic 04/09/2016  . Bilateral carotid artery disease ([place]) 11/28/2015  . Pancreatic insufficiency 05/09/2015  . 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 Risk for coronary artery disease greater than 20% in next 10 years 03/14/2015  . Diastolic dysfunction 47/65/4650  . Vulvar cyst 01/23/2015  . Abnormal uterine bleeding 12/02/2014  . Unexplained night sweats 12/02/2014  . Constipation due to opioid therapy 06/05/2014  . Loss of weight 03/29/2014  . Peripheral arterial disease ([place]) 12/12/2013  . GERD (gastroesophageal reflux disease) 10/12/2012  .  Healthcare maintenance 09/29/2012  . Back pain 03/28/2012  . Hyperlipidemia 04/01/2011  . BOILS, RECURRENT 03/13/2010  . Moderate aortic regurgitation 09/25/2008  . HEMORRHOIDS 06/08/2008  . Osteoarthritis 05/06/2007  . Former tobacco use 09/27/2006  . History of osteoporosis 06/29/2006  . Chronic pancreatitis ([place]) 03/29/2006  . Uncontrolled type 2 diabetes mellitus with chronic kidney disease, without  long-term current use of insulin ([place]) 03/15/2006  . Diabetic neuropathy, painful ([place]) 03/15/2006  . Anemia of chronic disease 03/15/2006  . Essential hypertension 03/15/2006  . VENTRICULAR HYPERTROPHY, LEFT 03/15/2006  . CKD stage 3 due to type 2 diabetes mellitus ([place]) 03/15/2006    Past Surgical History:  Procedure Laterality Date  . COLONOSCOPY    . DILATION AND CURETTAGE OF UTERUS N/A 06/16/2016   Procedure: DILATATION AND CURETTAGE;  Surgeon: Mora Bellman, MD;  Location: [facility];  Service: Gynecology;  Laterality: N/A;  . HYSTEROSCOPY N/A 06/16/2016   Procedure: HYSTEROSCOPY;  Surgeon: Mora Bellman, MD;  Location: [facility];  Service: Gynecology;  Laterality: N/A;  . LOWER EXTREMITY ANGIOGRAPHY N/A 08/09/2018   Procedure: LOWER EXTREMITY ANGIOGRAPHY;  Surgeon: Adrian Prows, MD;  Location: [facility];  Service: Cardiovascular;  Laterality: N/A;  . MOUTH SURGERY N/A   . POLYPECTOMY N/A 06/16/2016   Procedure: POLYPECTOMY;  Surgeon: Mora Bellman, MD;  Location: [facility];  Service: Gynecology;  Laterality: N/A;     OB History    Gravida  3   Para  3   Term  3   Preterm      AB      Living  3     SAB      TAB      Ectopic      Multiple      Live Births  3            Home Medications    Prior to Admission medications   Medication Sig Start Date End Date Taking? Authorizing Provider  amLODipine (NORVASC) 10 MG tablet Take 10 mg by mouth daily. 06/01/17  Yes [provider]  aspirin EC 81 MG tablet Take 1 tablet (81 mg total) by mouth daily. 04/09/16  Yes Lucious Groves, DO  glipiZIDE (GLUCOTROL XL) 2.5 MG 24 hr tablet Take 2.5 mg by mouth every morning. 12/12/18  Yes [provider]  LINZESS 145 MCG CAPS capsule Take 145 mcg by mouth daily.  03/07/17  Yes [provider]  omeprazole (PRILOSEC) 40 MG capsule Take 1 capsule (40 mg total) by mouth daily. Patient needs office visit for further refills 01/10/19  Yes Irene Shipper, MD   ondansetron (ZOFRAN) 8 MG tablet Take 1 tablet (8 mg total) by mouth 2 (two) times daily as needed for refractory nausea / vomiting. Start on day 3 after chemotherapy. 12/05/18  Yes Tish Men, MD  Pancrelipase, Lip-Prot-Amyl, 24000-76000 units CPEP Take 4 capsules (96,000 Units total) by mouth See admin instructions. Take 4 capsules by mouth 2-3 times daily before meals Patient taking differently: Take 2-4 capsules by mouth See admin instructions. Take 4 capsules by mouth two to three times a day with meals and 2 capsules with any snacks 03/10/16  Yes Hoffman, Erik C, DO  PAZEO 0.7 % SOLN Place 1 drop into both eyes at bedtime.  11/29/18  Yes [provider]  PROAIR HFA 108 (90 Base) MCG/ACT inhaler Inhale 2 puffs into the lungs 4 (four) times daily as needed for wheezing or shortness of breath. 04/30/17  Yes [provider]  prochlorperazine (COMPAZINE) 10 MG tablet Take 1 tablet (10 mg total) by mouth every 6 (six) hours as needed (Nausea or vomiting). 12/05/18  Yes Tish Men, MD  rosuvastatin (CRESTOR) 20 MG tablet TAKE 1 TABLET BY MOUTH EVERY DAY Patient taking differently: Take 20 mg by mouth daily.  04/04/18  Yes Miquel Dunn, NP  silver sulfADIAZINE (SILVADENE) 1 % cream Apply pea-sized amount to wound daily. Patient taking differently: Apply 1 application topically daily as needed (wound care).  09/08/18  Yes Evelina Bucy, DPM  sodium chloride (MURO 128) 5 % ophthalmic solution Place 1 drop into both eyes at bedtime.  08/31/18  Yes [provider]  TRESIBA FLEXTOUCH 100 UNIT/ML SOPN FlexTouch Pen Inject 20 Units into the skin every morning.  05/17/18  Yes [provider]  XARELTO 2.5 MG TABS tablet TAKE 1 TABLET(2.5 MG) BY MOUTH TWICE DAILY Patient taking differently: Take 2.5 mg by mouth 2 (two) times daily.  01/10/19  Yes Adrian Prows, MD  ACCU-CHEK AVIVA PLUS test strip TEST three times a day 11/25/15   Lucious Groves, DO  Blood Glucose Monitoring  Suppl (ACCU-CHEK AVIVA PLUS) w/Device KIT Use to check you blood sugar 3 times a day 06/04/15   Oval Linsey, MD  dexamethasone (DECADRON) 4 MG tablet Take 2 tablets (8 mg total) by mouth daily. Start the day after chemotherapy for 2 days. Take with food. Patient not taking: Reported on 01/30/2019 12/05/18   Tish Men, MD  lidocaine-prilocaine (EMLA) cream Apply to affected area once Patient not taking: Reported on 01/30/2019 12/05/18   Tish Men, MD  meclizine (ANTIVERT) 25 MG tablet Take 1 tablet (25 mg total) by mouth 3 (three) times daily as needed for dizziness. 01/31/19   Valarie Merino, MD  pregabalin (LYRICA) 75 MG capsule Take 1 capsule (75 mg total) by mouth daily. Patient not taking: Reported on 01/30/2019 08/04/18   Trula Slade, DPM  sucralfate (CARAFATE) 1 g tablet Take 1 tablet (1 g total) by mouth 3 (three) times daily after meals. Patient not taking: Reported on 01/30/2019 04/22/18   Pyrtle, Lajuan Lines, MD    Family History Family History  Problem Relation Age of Onset  . Dementia Mother   . Colon cancer Neg Hx     Social History Social History   Tobacco Use  . Smoking status: Former Smoker    Packs/day: 0.30    Years: 44.00    Pack years: 13.20    Types: Cigarettes    Quit date: 03/26/2016    Years since quitting: 2.8  . Smokeless tobacco: Never Used  Substance Use Topics  . Alcohol use: Not Currently    Alcohol/week: 0.0 standard drinks    Comment: "i used to be a heavy drinker"  . Drug use: No     Allergies   Atorvastatin, Gabapentin, Contrast media [iodinated diagnostic agents], and Nicorette [nicotine]   Review of Systems Review of Systems  Neurological: Positive for dizziness.  All other systems reviewed and are negative.    Physical Exam Updated Vital Signs BP (!) 115/55   Pulse 65   Temp (!) 97.5 F (36.4 C) (Oral)   Resp 14   Ht _0  (1.651 m)   Wt 60.3 kg   LMP 02/22/2016 (LMP Unknown)   SpO2 97%   BMI 22.13 kg/m   Physical Exam  Vitals signs and nursing note reviewed.  Constitutional:      General: She is not in acute  distress.    Appearance: Normal appearance. She is well-developed.  HENT:     Head: Normocephalic and atraumatic.  Eyes:     Conjunctiva/sclera: Conjunctivae normal.     Pupils: Pupils are equal, round, and reactive to light.  Neck:     Musculoskeletal: Normal range of motion and neck supple.  Cardiovascular:     Rate and Rhythm: Normal rate and regular rhythm.     Heart sounds: Normal heart sounds.  Pulmonary:     Effort: Pulmonary effort is normal. No respiratory distress.     Breath sounds: Normal breath sounds.  Abdominal:     General: There is no distension.     Palpations: Abdomen is soft.     Tenderness: There is no abdominal tenderness.  Musculoskeletal: Normal range of motion.        General: No deformity.  Skin:    General: Skin is warm and dry.  Neurological:     General: No focal deficit present.     Mental Status: She is alert and oriented to person, place, and time. Mental status is at baseline.     Cranial Nerves: No cranial nerve deficit.     Sensory: No sensory deficit.     Motor: No weakness.     Coordination: Coordination normal.      ED Treatments / Results  Labs (all labs ordered are listed, but only abnormal results are displayed) Labs Reviewed  COMPREHENSIVE METABOLIC PANEL - Abnormal; Notable for the following components:      Result Value   Glucose, Bld 117 (*)    BUN 41 (*)    Creatinine, Ser 1.40 (*)    Calcium 8.2 (*)    Albumin 2.4 (*)    GFR calc non Af Amer 39 (*)    GFR calc Af Amer 45 (*)    All other components within normal limits  CBC WITH DIFFERENTIAL/PLATELET - Abnormal; Notable for the following components:   RBC 3.80 (*)    Hemoglobin 10.4 (*)    HCT 34.1 (*)    All other components within normal limits    EKG EKG Interpretation  Date/Time:  Monday January 30 2019 21:50:03 EST Ventricular Rate:  71 PR Interval:    QRS  Duration: 112 QT Interval:  400 QTC Calculation: 435 R Axis:   -25 Text Interpretation: Sinus rhythm Abnormal R-wave progression, late transition Probable left ventricular hypertrophy Confirmed by Dene Gentry [phone]) on 01/30/2019 10:00:11 PM   Radiology No results found.  Procedures Procedures (including critical care time)  Medications Ordered in ED Medications  meclizine (ANTIVERT) tablet 25 mg (has no administration in time range)  sodium chloride 0.9 % bolus 500 mL (0 mLs Intravenous Stopped 01/30/19 2327)     Initial Impression / Assessment and Plan / ED Course  I have reviewed the triage vital signs and the nursing notes.  Pertinent labs & imaging results that were available during my care of the patient were reviewed by me and considered in my medical decision making (see chart for details).        MDM  Screen complete  [name] was evaluated in Emergency Department on 01/31/2019 for the symptoms described in the history of present illness. She was evaluated in the context of the global COVID-19 pandemic, which necessitated consideration that the patient might be at risk for infection with the SARS-CoV-2 virus that causes COVID-19. Institutional protocols and algorithms that pertain to the evaluation of patients at risk for COVID-19 are  in a state of rapid change based on information released by regulatory bodies including the CDC and federal and state organizations. These policies and algorithms were followed during the patient's care in the ED.  Patient is presenting for evaluation of reported dizziness and vertigo.  Symptoms reported today are consistent with patient's prior episodes of vertigo.  Screening labs did not demonstrate significant acute pathology.  She feels improved following her ED evaluation and now desires DC home.   Importance of close followup stressed. Strict return precautions given and understood.    Final Clinical Impressions(s) / ED  Diagnoses   Final diagnoses:  Dizziness  Vertigo    ED Discharge Orders         Ordered    meclizine (ANTIVERT) 25 MG tablet  3 times daily PRN     01/31/19 0005           Valarie Merino, MD 01/31/19 0010

## 2019-02-07 ENCOUNTER — Other Ambulatory Visit: Payer: Self-pay

## 2019-02-07 ENCOUNTER — Ambulatory Visit (HOSPITAL_COMMUNITY)
Admission: RE | Admit: 2019-02-07 | Discharge: 2019-02-07 | Disposition: A | Discharge status: Home / Self Care | Payer: Medicare Other | Source: Ambulatory Visit | Attending: Nephrology | Admitting: Nephrology

## 2019-02-07 VITALS — BP 119/56 | HR 75 | Temp 97.3°F | Resp 18

## 2019-02-07 DIAGNOSIS — D638 Anemia in other chronic diseases classified elsewhere: Secondary | ICD-10-CM | POA: Diagnosis present

## 2019-02-07 LAB — POCT HEMOGLOBIN-HEMACUE: Hemoglobin: 10.1 g/dL — ABNORMAL LOW (ref 12.0–15.0)

## 2019-02-07 MED ORDER — EPOETIN ALFA 10000 UNIT/ML IJ SOLN
INTRAMUSCULAR | Status: AC
  Administered 2019-02-07: 10000 [IU] via SUBCUTANEOUS
  Filled 2019-02-07: qty 1

## 2019-02-07 MED ORDER — EPOETIN ALFA 10000 UNIT/ML IJ SOLN: 10000.0000 [IU] | INTRAMUSCULAR | Status: DC

## 2019-02-12 ENCOUNTER — Telehealth: Payer: Self-pay | Admitting: Adult Health Nurse Practitioner

## 2019-02-12 NOTE — Telephone Encounter (Signed)
Returning daughter's call about her mother being in pain and she is currently taking morphine 15 mg Q6 hrs.   Daughter states that it is not as effective as it used to be and her mother is still in pain.  Instructed that she could give her mother one extra dose today and will have her palliative provider reach out to her tomorrow.   [name] K. Olena Heckle NP

## 2019-02-13 ENCOUNTER — Other Ambulatory Visit: Payer: Medicare Other | Admitting: Hospice

## 2019-02-13 ENCOUNTER — Other Ambulatory Visit: Payer: Self-pay | Admitting: Family

## 2019-02-13 ENCOUNTER — Telehealth: Payer: Self-pay

## 2019-02-13 ENCOUNTER — Other Ambulatory Visit: Payer: Self-pay

## 2019-02-13 ENCOUNTER — Telehealth: Payer: Self-pay | Admitting: *Deleted

## 2019-02-13 DIAGNOSIS — G893 Neoplasm related pain (acute) (chronic): Secondary | ICD-10-CM

## 2019-02-13 DIAGNOSIS — Z515 Encounter for palliative care: Secondary | ICD-10-CM

## 2019-02-13 DIAGNOSIS — C099 Malignant neoplasm of tonsil, unspecified: Secondary | ICD-10-CM

## 2019-02-13 MED ORDER — MORPHINE SULFATE 15 MG PO TABS: 15.0000 mg | ORAL_TABLET | Freq: Four times a day (QID) | ORAL | 0 refills | Status: DC | PRN

## 2019-02-13 NOTE — Progress Notes (Signed)
[facility] Pall[facility] Consult Note Telephone: [phone]  Fax: [phone]  PATIENT NAME: [name] DOB: 1951-01-29 MRN: [id number]  PRIMARY CARE PROVIDER:   Dr Tish Men REFERRING PROVIDER:Dr Tish Men RESPONSIBLE PARTY:[id number] Contact: Daughter, [name] [phone]  TELEHEALTH VISIT STATEMENT Due to the COVID-19 crisis, this visit was done via telephone from my office. It was initiated and consented to by this patient and/or family      RECOMMENDATIONS/PLAN Advance Care Plan: Telehealth visit to build trust and check up on patient related to message over the weekend that Morphine 12m every 6 hours PRN pain is no longer as it used to be. Patient endorsed reduced efficacy and also said she needed prescription for same sent to her pharmacy. Patient is a DNR. Willing to discuss Hospice services in the future on the condition that she will not have many people coming to her house. She said she is a private person. Goals of care include to maximize quality of life and symptom management. Spoke to both patient and her daughter [name] They will call Pharmacy/Dr Zhoa for Morphine prescription. NP also discussed recommendation to Dr ZSalem Senatefor Fentanyl patch via secured chat on Epic. Symptom management: Pain related to left tonsil CA currently managed with Morphine IR 166mevery 6 hours as needed for pain. Patient said this is not as effective as it used to be.  Fentanyl patch 12.5MCG/hr every 3 days is recommended in addition to current PRN Morphine.  Follow upKP:VVZSMOLMBEare will continue to follow patient for goals of care clarification and symptom management.  I spent 30 minutes providing this consultation,  from 12.30pm to 1pm.  More than 50% of the time in this consultation was spent coordinating communication.   HISTORY OF PRESENT ILLNESS:Kiara C Lawsis a [age].o.year oldfemalewith multiple medical problems including Squamous  cell carcinoma lf left tonsil, Type 2 DM, PAD, Pancreatitis.Palliative Care was asked to help address goals of care  CODE STATUS: DNR  PPS: 60% HOSPICE ELIGIBILITY/DIAGNOSIS: TBD  PAST MEDICAL HISTORY:  Past Medical History:  Diagnosis Date  . ANEMIA, CHRONIC DISEASE NEC 03/15/2006  . Carotid artery occlusion   . Cir[place]osis ([facility]  . Colon polyps   . Constipation due to opioid therapy 06/05/2014  . DEGENERATIVE JOINT DISEASE 05/06/2007   On going for >5 yrs No imaging to confirm Has tried ultram, mobic, neurontin which did not work PeAmbulance personried once worked well    . Diabetes mellitus    Type II  . DIABETIC  RETINOPATHY 03/15/2006  . 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 Diastolic dysfunction 1167/54/4920 Grade 1 by Echocardiogram 12/13/14  . Elevated liver enzymes   . Heart murmur   . Hemorrhoids   . History of kidney stones   . Hyperlipidemia 04/01/2011  . Hypertension   . Mild AI (aortic insufficiency)   . Moderate aortic regurgitation 09/25/2008   12/13/14 Echocardiogram    . Myalgia   . PAD (peripheral artery disease) ([place]  . PANCREATITIS, CHRONIC 03/29/2006  . Peripheral arterial disease ([place]  . Pneumonia    age 68-20. RENAL INSUFFICIENCY, CHRONIC 03/15/2006  . Tonsillar mass    Left    SOCIAL HX:  Social History   Tobacco Use  . Smoking status: Former Smoker    Packs/day: 0.30    Years: 44.00    Pack years: 13.20    Types: Cigarettes    Quit date: 03/26/2016    Years since  quitting: 2.8  . Smokeless tobacco: Never Used  Substance Use Topics  . Alcohol use: Not Currently    Alcohol/week: 0.0 standard drinks    Comment: "i used to be a heavy drinker"    ALLERGIES:  Allergies  Allergen Reactions  . Atorvastatin Other (See Comments)    Caused muscles in her back to be very sore  . Gabapentin Swelling    Legs and feet swell  . Contrast Media [Iodinated Diagnostic Agents] Rash  . Nicorette [Nicotine] Nausea Only     PERTINENT MEDICATIONS:    Outpatient Encounter Medications as of 12/67/2020  Medication Sig  . ACCU-CHEK AVIVA PLUS test strip TEST three times a day  . amLODipine (NORVASC) 10 MG tablet Take 10 mg by mouth daily.  Marland Kitchen aspirin EC 81 MG tablet Take 1 tablet (81 mg total) by mouth daily.  . Blood Glucose Monitoring Suppl (ACCU-CHEK AVIVA PLUS) w/Device KIT Use to check you blood sugar 3 times a day  . dexamethasone (DECADRON) 4 MG tablet Take 2 tablets (8 mg total) by mouth daily. Start the day after chemotherapy for 2 days. Take with food. (Patient not taking: Reported on 01/30/2019)  . glipiZIDE (GLUCOTROL XL) 2.5 MG 24 hr tablet Take 2.5 mg by mouth every morning.  . lidocaine-prilocaine (EMLA) cream Apply to affected area once (Patient not taking: Reported on 01/30/2019)  . LINZESS 145 MCG CAPS capsule Take 145 mcg by mouth daily.   . meclizine (ANTIVERT) 25 MG tablet Take 1 tablet (25 mg total) by mouth 3 (three) times daily as needed for dizziness.  Marland Kitchen omeprazole (PRILOSEC) 40 MG capsule Take 1 capsule (40 mg total) by mouth daily. Patient needs office visit for further refills  . ondansetron (ZOFRAN) 8 MG tablet Take 1 tablet (8 mg total) by mouth 2 (two) times daily as needed for refractory nausea / vomiting. Start on day 3 after chemotherapy.  . Pancrelipase, Lip-Prot-Amyl, 24000-76000 units CPEP Take 4 capsules (96,000 Units total) by mouth See admin instructions. Take 4 capsules by mouth 2-3 times daily before meals (Patient taking differently: Take 2-4 capsules by mouth See admin instructions. Take 4 capsules by mouth two to three times a day with meals and 2 capsules with any snacks)  . PAZEO 0.7 % SOLN Place 1 drop into both eyes at bedtime.   . pregabalin (LYRICA) 75 MG capsule Take 1 capsule (75 mg total) by mouth daily. (Patient not taking: Reported on 01/30/2019)  . PROAIR HFA 108 (90 Base) MCG/ACT inhaler Inhale 2 puffs into the lungs 4 (four) times daily as needed for wheezing or shortness of breath.  .  prochlorperazine (COMPAZINE) 10 MG tablet Take 1 tablet (10 mg total) by mouth every 6 (six) hours as needed (Nausea or vomiting).  . rosuvastatin (CRESTOR) 20 MG tablet TAKE 1 TABLET BY MOUTH EVERY DAY (Patient taking differently: Take 20 mg by mouth daily. )  . silver sulfADIAZINE (SILVADENE) 1 % cream Apply pea-sized amount to wound daily. (Patient taking differently: Apply 1 application topically daily as needed (wound care). )  . sodium chloride (MURO 128) 5 % ophthalmic solution Place 1 drop into both eyes at bedtime.   . sucralfate (CARAFATE) 1 g tablet Take 1 tablet (1 g total) by mouth 3 (three) times daily after meals. (Patient not taking: Reported on 01/30/2019)  . TRESIBA FLEXTOUCH 100 UNIT/ML SOPN FlexTouch Pen Inject 20 Units into the skin every morning.   Alveda Reasons 2.5 MG TABS tablet TAKE 1 TABLET(2.5 MG) BY  MOUTH TWICE DAILY (Patient taking differently: Take 2.5 mg by mouth 2 (two) times daily. )   No facility-administered encounter medications on file as of 02/13/2019.    Teodoro Spray, NP

## 2019-02-13 NOTE — Telephone Encounter (Signed)
Late entry for 02/12/2019- received a message from patient's daughter, Ivin Booty, regarding patient's pain level. Notified on call NP for follow up.

## 2019-02-13 NOTE — Telephone Encounter (Signed)
Received call from patient requesting a refill on her morphine 15 mg. I do not see this on her med list. Pt is seen by Palliative care at home  Please advise.

## 2019-02-14 NOTE — Telephone Encounter (Signed)
Ok.  I thought that may be the case.  Drucie Ip, RN

## 2019-02-14 NOTE — Telephone Encounter (Signed)
[name],  Can you confirm with palliative care that they have a physician prescribing pain medications as needed? I do not think it is appropriate for oncology to continue pain medications, as she has declined all treatment and refuses to come to her appointments.   Dr. Maylon Peppers

## 2019-02-14 NOTE — Telephone Encounter (Signed)
The patient is no longer under my care, as she has declined all treatment several months ago. She was referred to palliative care for pain management. As I discussed with the patient previously, I can no longer prescribe opioid medications if she declines all oncology-related treatment. She should contact her palliative care physician for pain medication refills.  Thanks.  Dr. Maylon Peppers

## 2019-02-15 ENCOUNTER — Telehealth: Payer: Self-pay | Admitting: *Deleted

## 2019-02-15 NOTE — Telephone Encounter (Signed)
Oncology Nurse Navigator Documentation  Spoke with [facility] NP Einar Crow in follow-up to call from Ms. Nicasio to Dr. Maylon Peppers requesting analgesic Rx. Explained Ms. Conway declined tmt and has not established with [facility] physicians so they are not in a position to prescribe. NP indicated she had talked with Dr. Maylon Peppers and understands Rx requests should be directed to PCP. I provided her phone number for Dr. Jeanie Cooks who is listed as PCP (Care Team) as well as [employer].  Gayleen Orem, RN, BSN Head & Neck Oncology Nurse [place] [place] [phone]

## 2019-02-16 ENCOUNTER — Other Ambulatory Visit: Payer: Medicare Other | Admitting: Hospice

## 2019-02-16 ENCOUNTER — Other Ambulatory Visit: Payer: Self-pay

## 2019-02-16 DIAGNOSIS — Z515 Encounter for palliative care: Secondary | ICD-10-CM

## 2019-02-16 DIAGNOSIS — C099 Malignant neoplasm of tonsil, unspecified: Secondary | ICD-10-CM

## 2019-02-16 NOTE — Progress Notes (Addendum)
[facility] Consult Note Telephone: [phone]  Fax: [phone]  PATIENT NAME: [name] DOB: [birth year] MRN: [id number]  PRIMARY CARE PROVIDER:   Nolene Ebbs, MD  REFERRING PROVIDER:Dr Tish Men RESPONSIBLE PARTY:[id number] Contact: Daughter, Christe Tellez [phone]  TELEHEALTH VISIT STATEMENT Due to the COVID-19 crisis, this visit was done via telephone from my office. It was initiated and consented to by this patient and/or family   RECOMMENDATIONS/PLAN Advance Care Plan:Telehealth visit to build trust and check up on patient related to her Morphine presrciption. She said she got new prescription for Morphine from [facility] NP and she was going to pick it up after televisit. Discussed message from Dr Salem Senate and later from his staff D Liliane Channel RN that Dr Salem Senate will no longer prescribe narcotics for her because she has  refused all treatments. She was also informed that from the records her PCP is Dr Revonda Humphrey. She verbalized understanding and said she will not continue with Dr Annamarie Major because he does no do in person visit because of COVID-19; that she will get another PCP by next week or after the holidays.  Patient is a DNR. Willing to discuss Hospice services in the future on the condition that she will not have many people coming to her house. Goals of care include to maximize quality of life and symptom management. NP set up another appointment with patient and encouraged her to call with any concerns if it arises before then.  Symptom management:  Patient said she was doing fairly well; denied pain at this time. Pain related to left tonsil CA currently managed with Morphine IR 71m every 6 hours as needed for pain.   Follow uIO:EVOJJKKXFGcare will continue to follow patient for goals of care clarification and symptom management.  I spent352mutes providing this consultation, from 11.15am to 11.45am.  More than  50% of the time in this consultation was spent coordinating communication.   HISTORY OF PRESENT ILLNESS:Kiara C Lawsis a [age]year oldfemalewith multiple medical problems including Squamous cell carcinoma lf left tonsil, Type 2 DM, PAD, Pancreatitis.Palliative Care was asked to help address goals of care  CODE STATUS: DNR  PPS: 60% HOSPICE ELIGIBILITY/DIAGNOSIS: TBD  PAST MEDICAL HISTORY:  Past Medical History:  Diagnosis Date  . ANEMIA, CHRONIC DISEASE NEC 03/15/2006  . Carotid artery occlusion   . Cirrhosis ([facility]  . Colon polyps   . Constipation due to opioid therapy 06/05/2014  . DEGENERATIVE JOINT DISEASE 05/06/2007   On going for >5 yrs No imaging to confirm Has tried ultram, mobic, neurontin which did not work Pe[facility]ulance personried once worked well    . Diabetes mellitus    Type II  . DIABETIC  RETINOPATHY 03/15/2006  . 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 Diastolic dysfunction 1118/29/9371 Grade 1 by Echocardiogram 12/13/14  . Elevated liver enzymes   . Heart murmur   . Hemorrhoids   . History of kidney stones   . Hyperlipidemia 04/01/2011  . Hypertension   . Mild AI (aortic insufficiency)   . Moderate aortic regurgitation 09/25/2008   12/13/14 Echocardiogram    . Myalgia   . P[place]peripheral artery disease) ([facility]  . PANCREATITIS, CHRONIC 03/29/2006  . [place]ipheral arterial disease ([facility]  . Pneumonia    age [age]. RENAL INSUFFICIENCY, CHRONIC 03/15/2006  . Tonsillar mass    Left    SOCIAL HX:  Social History   Tobacco Use  . Smoking status:  Former Smoker    Packs/day: 0.30    Years: 44.00    Pack years: 13.20    Types: Cigarettes    Quit date: 03/26/2016    Years since quitting: 2.8  . Smokeless tobacco: Never Used  Substance Use Topics  . Alcohol use: Not Currently    Alcohol/week: 0.0 standard drinks    Comment: "i used to be a heavy drinker"    ALLERGIES:  Allergies  Allergen Reactions  . Atorvastatin Other (See Comments)    Caused  muscles in her back to be very sore  . Gabapentin Swelling    Legs and feet swell  . Contrast Media [Iodinated Diagnostic Agents] Rash  . Nicorette [Nicotine] Nausea Only     PERTINENT MEDICATIONS:  Outpatient Encounter Medications as of 02/16/2019  Medication Sig  . ACCU-CHEK AVIVA PLUS test strip TEST three times a day  . amLODipine (NORVASC) 10 MG tablet Take 10 mg by mouth daily.  . aspirin EC 81 MG tablet Take 1 tablet (81 mg total) by mouth daily.  . Blood Glucose Monitoring Suppl (ACCU-CHEK AVIVA PLUS) w/Device KIT Use to check you blood sugar 3 times a day  . dexamethasone (DECADRON) 4 MG tablet Take 2 tablets (8 mg total) by mouth daily. Start the day after chemotherapy for 2 days. Take with food. (Patient not taking: Reported on 01/30/2019)  . glipiZIDE (GLUCOTROL XL) 2.5 MG 24 hr tablet Take 2.5 mg by mouth every morning.  . lidocaine-prilocaine (EMLA) cream Apply to affected area once (Patient not taking: Reported on 01/30/2019)  . LINZESS 145 MCG CAPS capsule Take 145 mcg by mouth daily.   . meclizine (ANTIVERT) 25 MG tablet Take 1 tablet (25 mg total) by mouth 3 (three) times daily as needed for dizziness.  . morphine (MSIR) 15 MG tablet Take 1 tablet (15 mg total) by mouth every 6 (six) hours as needed for severe pain.  . omeprazole (PRILOSEC) 40 MG capsule Take 1 capsule (40 mg total) by mouth daily. Patient needs office visit for further refills  . ondansetron (ZOFRAN) 8 MG tablet Take 1 tablet (8 mg total) by mouth 2 (two) times daily as needed for refractory nausea / vomiting. Start on day 3 after chemotherapy.  . Pancrelipase, Lip-Prot-Amyl, 24000-76000 units CPEP Take 4 capsules (96,000 Units total) by mouth See admin instructions. Take 4 capsules by mouth 2-3 times daily before meals (Patient taking differently: Take 2-4 capsules by mouth See admin instructions. Take 4 capsules by mouth two to three times a day with meals and 2 capsules with any snacks)  . PAZEO 0.7 % SOLN  Place 1 drop into both eyes at bedtime.   . pregabalin (LYRICA) 75 MG capsule Take 1 capsule (75 mg total) by mouth daily. (Patient not taking: Reported on 01/30/2019)  . PROAIR HFA 108 (90 Base) MCG/ACT inhaler Inhale 2 puffs into the lungs 4 (four) times daily as needed for wheezing or shortness of breath.  . prochlorperazine (COMPAZINE) 10 MG tablet Take 1 tablet (10 mg total) by mouth every 6 (six) hours as needed (Nausea or vomiting).  . rosuvastatin (CRESTOR) 20 MG tablet TAKE 1 TABLET BY MOUTH EVERY DAY (Patient taking differently: Take 20 mg by mouth daily. )  . silver sulfADIAZINE (SILVADENE) 1 % cream Apply pea-sized amount to wound daily. (Patient taking differently: Apply 1 application topically daily as needed (wound care). )  . sodium chloride (MURO 128) 5 % ophthalmic solution Place 1 drop into both eyes at bedtime.   .   sucralfate (CARAFATE) 1 g tablet Take 1 tablet (1 g total) by mouth 3 (three) times daily after meals. (Patient not taking: Reported on 01/30/2019)  . TRESIBA FLEXTOUCH 100 UNIT/ML SOPN FlexTouch Pen Inject 20 Units into the skin every morning.   . XARELTO 2.5 MG TABS tablet TAKE 1 TABLET(2.5 MG) BY MOUTH TWICE DAILY (Patient taking differently: Take 2.5 mg by mouth 2 (two) times daily. )   No facility-administered encounter medications on file as of 02/16/2019.    Livina I Esh[place], NP    

## 2019-02-21 ENCOUNTER — Other Ambulatory Visit: Payer: Self-pay

## 2019-02-21 ENCOUNTER — Ambulatory Visit (HOSPITAL_COMMUNITY)
Admission: RE | Admit: 2019-02-21 | Discharge: 2019-02-21 | Disposition: A | Discharge status: Home / Self Care | Payer: Medicare Other | Source: Ambulatory Visit | Attending: Nephrology | Admitting: Nephrology

## 2019-02-21 VITALS — BP 109/56 | HR 69 | Temp 97.1°F | Resp 18

## 2019-02-21 DIAGNOSIS — D638 Anemia in other chronic diseases classified elsewhere: Secondary | ICD-10-CM | POA: Diagnosis present

## 2019-02-21 MED ORDER — EPOETIN ALFA 10000 UNIT/ML IJ SOLN
INTRAMUSCULAR | Status: AC
  Administered 2019-02-21: 10000 [IU] via SUBCUTANEOUS
  Filled 2019-02-21: qty 1

## 2019-02-21 MED ORDER — EPOETIN ALFA 10000 UNIT/ML IJ SOLN: 10000.0000 [IU] | INTRAMUSCULAR | Status: DC

## 2019-02-22 LAB — POCT HEMOGLOBIN-HEMACUE: Hemoglobin: 10.3 g/dL — ABNORMAL LOW (ref 12.0–15.0)

## 2019-03-07 ENCOUNTER — Ambulatory Visit (HOSPITAL_COMMUNITY)
Admission: RE | Admit: 2019-03-07 | Discharge: 2019-03-07 | Disposition: A | Discharge status: Home / Self Care | Payer: Medicare Other | Source: Ambulatory Visit | Attending: Nephrology | Admitting: Nephrology

## 2019-03-07 ENCOUNTER — Other Ambulatory Visit: Payer: Self-pay

## 2019-03-07 VITALS — BP 122/58 | HR 68 | Temp 96.8°F | Resp 18

## 2019-03-07 DIAGNOSIS — D638 Anemia in other chronic diseases classified elsewhere: Secondary | ICD-10-CM | POA: Diagnosis not present

## 2019-03-07 LAB — FERRITIN: Ferritin: 53 ng/mL (ref 11–307)

## 2019-03-07 LAB — IRON AND TIBC
Iron: 33 ug/dL (ref 28–170)
Saturation Ratios: 16 % (ref 10.4–31.8)
TIBC: 202 ug/dL — ABNORMAL LOW (ref 250–450)
UIBC: 169 ug/dL

## 2019-03-07 LAB — POCT HEMOGLOBIN-HEMACUE: Hemoglobin: 10.1 g/dL — ABNORMAL LOW (ref 12.0–15.0)

## 2019-03-07 MED ORDER — EPOETIN ALFA 10000 UNIT/ML IJ SOLN: 10000.0000 [IU] | INTRAMUSCULAR | Status: DC

## 2019-03-07 MED ORDER — EPOETIN ALFA 10000 UNIT/ML IJ SOLN
INTRAMUSCULAR | Status: AC
  Administered 2019-03-07: 10000 [IU] via SUBCUTANEOUS
  Filled 2019-03-07: qty 1

## 2019-03-08 ENCOUNTER — Telehealth: Payer: Self-pay | Admitting: Hospice

## 2019-03-08 NOTE — Telephone Encounter (Signed)
Called twice for scheduled telehealth visit but patient not picking the call; voicemail not set up

## 2019-03-09 ENCOUNTER — Other Ambulatory Visit: Payer: Self-pay

## 2019-03-09 ENCOUNTER — Ambulatory Visit (INDEPENDENT_AMBULATORY_CARE_PROVIDER_SITE_OTHER): Payer: Medicare Other | Admitting: Podiatry

## 2019-03-09 DIAGNOSIS — Q828 Other specified congenital malformations of skin: Secondary | ICD-10-CM | POA: Diagnosis not present

## 2019-03-09 DIAGNOSIS — E1142 Type 2 diabetes mellitus with diabetic polyneuropathy: Secondary | ICD-10-CM | POA: Diagnosis not present

## 2019-03-09 DIAGNOSIS — B351 Tinea unguium: Secondary | ICD-10-CM

## 2019-03-10 ENCOUNTER — Encounter (HOSPITAL_COMMUNITY): Payer: Self-pay | Admitting: Emergency Medicine

## 2019-03-10 ENCOUNTER — Other Ambulatory Visit: Payer: Self-pay

## 2019-03-10 ENCOUNTER — Emergency Department (HOSPITAL_COMMUNITY)
Admission: EM | Admit: 2019-03-10 | Discharge: 2019-03-11 | Disposition: A | Discharge status: Home / Self Care | Payer: Medicare Other | Attending: Emergency Medicine | Admitting: Emergency Medicine

## 2019-03-10 DIAGNOSIS — G893 Neoplasm related pain (acute) (chronic): Secondary | ICD-10-CM | POA: Insufficient documentation

## 2019-03-10 DIAGNOSIS — C099 Malignant neoplasm of tonsil, unspecified: Secondary | ICD-10-CM | POA: Diagnosis not present

## 2019-03-10 DIAGNOSIS — E11649 Type 2 diabetes mellitus with hypoglycemia without coma: Secondary | ICD-10-CM | POA: Insufficient documentation

## 2019-03-10 DIAGNOSIS — E162 Hypoglycemia, unspecified: Secondary | ICD-10-CM

## 2019-03-10 DIAGNOSIS — R070 Pain in throat: Secondary | ICD-10-CM | POA: Diagnosis present

## 2019-03-10 DIAGNOSIS — I10 Essential (primary) hypertension: Secondary | ICD-10-CM | POA: Diagnosis not present

## 2019-03-10 DIAGNOSIS — Z794 Long term (current) use of insulin: Secondary | ICD-10-CM | POA: Insufficient documentation

## 2019-03-10 DIAGNOSIS — E1140 Type 2 diabetes mellitus with diabetic neuropathy, unspecified: Secondary | ICD-10-CM | POA: Diagnosis not present

## 2019-03-10 DIAGNOSIS — Z79899 Other long term (current) drug therapy: Secondary | ICD-10-CM | POA: Diagnosis not present

## 2019-03-10 DIAGNOSIS — Z87891 Personal history of nicotine dependence: Secondary | ICD-10-CM | POA: Diagnosis not present

## 2019-03-10 DIAGNOSIS — Z7901 Long term (current) use of anticoagulants: Secondary | ICD-10-CM | POA: Insufficient documentation

## 2019-03-10 DIAGNOSIS — Z7982 Long term (current) use of aspirin: Secondary | ICD-10-CM | POA: Diagnosis not present

## 2019-03-10 LAB — CBG MONITORING, ED: Glucose-Capillary: 86 mg/dL (ref 70–99)

## 2019-03-10 NOTE — ED Triage Notes (Signed)
Per pt she found out that she has throat cancer last September she is having 10/10 throat pain and low blood sugar all the time. Wants to be check/

## 2019-03-11 DIAGNOSIS — C099 Malignant neoplasm of tonsil, unspecified: Secondary | ICD-10-CM | POA: Diagnosis not present

## 2019-03-11 LAB — COMPREHENSIVE METABOLIC PANEL
ALT: 17 U/L (ref 0–44)
AST: 16 U/L (ref 15–41)
Albumin: 2.2 g/dL — ABNORMAL LOW (ref 3.5–5.0)
Alkaline Phosphatase: 98 U/L (ref 38–126)
Anion gap: 7 (ref 5–15)
BUN: 18 mg/dL (ref 8–23)
CO2: 21 mmol/L — ABNORMAL LOW (ref 22–32)
Calcium: 8.2 mg/dL — ABNORMAL LOW (ref 8.9–10.3)
Chloride: 108 mmol/L (ref 98–111)
Creatinine, Ser: 1.37 mg/dL — ABNORMAL HIGH (ref 0.44–1.00)
GFR calc Af Amer: 46 mL/min — ABNORMAL LOW (ref >60)
GFR calc non Af Amer: 40 mL/min — ABNORMAL LOW (ref >60)
Glucose, Bld: 93 mg/dL (ref 70–99)
Potassium: 3.8 mmol/L (ref 3.5–5.1)
Sodium: 136 mmol/L (ref 135–145)
Total Bilirubin: 0.6 mg/dL (ref 0.3–1.2)
Total Protein: 6.6 g/dL (ref 6.5–8.1)

## 2019-03-11 LAB — CBC WITH DIFFERENTIAL/PLATELET
Abs Immature Granulocytes: 0.01 10*3/uL (ref 0.00–0.07)
Basophils Absolute: 0 10*3/uL (ref 0.0–0.1)
Basophils Relative: 1 %
Eosinophils Absolute: 0.1 10*3/uL (ref 0.0–0.5)
Eosinophils Relative: 2 %
HCT: 34 % — ABNORMAL LOW (ref 36.0–46.0)
Hemoglobin: 10.3 g/dL — ABNORMAL LOW (ref 12.0–15.0)
Immature Granulocytes: 0 %
Lymphocytes Relative: 39 %
Lymphs Abs: 1.7 10*3/uL (ref 0.7–4.0)
MCH: 27.3 pg (ref 26.0–34.0)
MCHC: 30.3 g/dL (ref 30.0–36.0)
MCV: 90.2 fL (ref 80.0–100.0)
Monocytes Absolute: 0.4 10*3/uL (ref 0.1–1.0)
Monocytes Relative: 10 %
Neutro Abs: 2.1 10*3/uL (ref 1.7–7.7)
Neutrophils Relative %: 48 %
Platelets: 224 10*3/uL (ref 150–400)
RBC: 3.77 MIL/uL — ABNORMAL LOW (ref 3.87–5.11)
RDW: 15.9 % — ABNORMAL HIGH (ref 11.5–15.5)
WBC: 4.4 10*3/uL (ref 4.0–10.5)
nRBC: 0 % (ref 0.0–0.2)

## 2019-03-11 LAB — CBG MONITORING, ED
Glucose-Capillary: 77 mg/dL (ref 70–99)
Glucose-Capillary: 111 mg/dL — ABNORMAL HIGH (ref 70–99)
Glucose-Capillary: 130 mg/dL — ABNORMAL HIGH (ref 70–99)

## 2019-03-11 MED ORDER — LIDOCAINE VISCOUS HCL 2 % MT SOLN
15.0000 mL | Freq: Once | OROMUCOSAL | Status: AC
  Administered 2019-03-11: 15 mL via OROMUCOSAL
  Filled 2019-03-11: qty 15

## 2019-03-11 MED ORDER — OXYCODONE-ACETAMINOPHEN 5-325 MG PO TABS
1.0000 | ORAL_TABLET | Freq: Once | ORAL | Status: AC
  Administered 2019-03-11: 03:00:00 1 via ORAL
  Filled 2019-03-11: qty 1

## 2019-03-11 MED ORDER — MORPHINE SULFATE 15 MG PO TABS: 15.0000 mg | ORAL_TABLET | Freq: Four times a day (QID) | ORAL | 0 refills | Status: AC | PRN

## 2019-03-11 NOTE — ED Provider Notes (Signed)
TIME SEEN: 2:29 AM  CHIEF COMPLAINT: Throat pain, hypoglycemia  HPI: Patient is a 69 year old female with history of diabetes on Tresiba, recent diagnosis of squamous cell carcinoma of the left tonsil in September 2020 who presents to the emergency department with throat pain.  She is being followed by palliative care.  She has refused chemotherapy and has missed many appointments with ENT and oncology.  She has been prescribed morphine by her oncologist but states she only has a few tablets left.  Per oncology notes, given they are not actively treating patient they have recommended she follow-up with her PCP for further pain management.  It is unclear if patient has followed up with her primary doctor.  She states due to the throat pain she is not able to eat and drink normally and she thinks this is what is causing her blood sugars to drop.  Over the past couple of weeks they have been as low as 27.  Today blood sugar was 47.  She has been compliant with her [place].  No vomiting or diarrhea.  No fever.  ROS: See HPI Constitutional: no fever  Eyes: no drainage  ENT: no runny nose   Cardiovascular:  no chest pain  Resp: no SOB  GI: no vomiting GU: no dysuria Integumentary: no rash  Allergy: no hives  Musculoskeletal: no leg swelling  Neurological: no slurred speech ROS otherwise negative  PAST MEDICAL HISTORY/PAST SURGICAL HISTORY:  Past Medical History:  Diagnosis Date  . ANEMIA, CHRONIC DISEASE NEC 03/15/2006  . Carotid artery occlusion   . Cirrhosis ([place])   . Colon polyps   . Constipation due to opioid therapy 06/05/2014  . DEGENERATIVE JOINT DISEASE 05/06/2007   On going for >5 yrs No imaging to confirm Has tried ultram, mobic, neurontin which did not work Ambulance person tried once worked well    . Diabetes mellitus    Type II  . DIABETIC  RETINOPATHY 03/15/2006  . 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 Diastolic dysfunction 96/22/2979   Grade 1 by Echocardiogram 12/13/14  .  Elevated liver enzymes   . Heart murmur   . Hemorrhoids   . History of kidney stones   . Hyperlipidemia 04/01/2011  . Hypertension   . Mild AI (aortic insufficiency)   . Moderate aortic regurgitation 09/25/2008   12/13/14 Echocardiogram    . Myalgia   . PAD (peripheral artery disease) ([place])   . PANCREATITIS, CHRONIC 03/29/2006  . Peripheral arterial disease ([place])   . Pneumonia    age 48-20  . RENAL INSUFFICIENCY, CHRONIC 03/15/2006  . Tonsillar mass    Left    MEDICATIONS:  Prior to Admission medications   Medication Sig Start Date End Date Taking? Authorizing Provider  ACCU-CHEK AVIVA PLUS test strip TEST three times a day 11/25/15   Lucious Groves, DO  amLODipine (NORVASC) 10 MG tablet Take 10 mg by mouth daily. 06/01/17   [provider]  aspirin EC 81 MG tablet Take 1 tablet (81 mg total) by mouth daily. 04/09/16   Lucious Groves, DO  Blood Glucose Monitoring Suppl (ACCU-CHEK AVIVA PLUS) w/Device KIT Use to check you blood sugar 3 times a day 06/04/15   Oval Linsey, MD  dexamethasone (DECADRON) 4 MG tablet Take 2 tablets (8 mg total) by mouth daily. Start the day after chemotherapy for 2 days. Take with food. 12/05/18   Tish Men, MD  glipiZIDE (GLUCOTROL XL) 2.5 MG 24 hr tablet Take 2.5 mg by mouth every morning. 12/12/18  [provider]  lidocaine-prilocaine (EMLA) cream Apply to affected area once 12/05/18   Tish Men, MD  LINZESS 145 MCG CAPS capsule Take 145 mcg by mouth daily.  03/07/17   [provider]  meclizine (ANTIVERT) 25 MG tablet Take 1 tablet (25 mg total) by mouth 3 (three) times daily as needed for dizziness. 01/31/19   Valarie Merino, MD  morphine (MSIR) 15 MG tablet Take 1 tablet (15 mg total) by mouth every 6 (six) hours as needed for severe pain. 02/13/19 03/15/19  [place], Holli Humbles, NP  Olopatadine HCl 0.2 % SOLN  02/21/19   [provider]  omeprazole (PRILOSEC) 40 MG capsule Take 1 capsule (40 mg total) by mouth daily.  Patient needs office visit for further refills 01/10/19   Irene Shipper, MD  ondansetron (ZOFRAN) 8 MG tablet Take 1 tablet (8 mg total) by mouth 2 (two) times daily as needed for refractory nausea / vomiting. Start on day 3 after chemotherapy. 12/05/18   Tish Men, MD  Pancrelipase, Lip-Prot-Amyl, 24000-76000 units CPEP Take 4 capsules (96,000 Units total) by mouth See admin instructions. Take 4 capsules by mouth 2-3 times daily before meals Patient taking differently: Take 2-4 capsules by mouth See admin instructions. Take 4 capsules by mouth two to three times a day with meals and 2 capsules with any snacks 03/10/16   Joni Reining C, DO  PAZEO 0.7 % SOLN Place 1 drop into both eyes at bedtime.  11/29/18   [provider]  pregabalin (LYRICA) 75 MG capsule Take 1 capsule (75 mg total) by mouth daily. 08/04/18   Trula Slade, DPM  PROAIR HFA 108 [phone] Base) MCG/ACT inhaler Inhale 2 puffs into the lungs 4 (four) times daily as needed for wheezing or shortness of breath. 04/30/17   [provider]  prochlorperazine (COMPAZINE) 10 MG tablet Take 1 tablet (10 mg total) by mouth every 6 (six) hours as needed (Nausea or vomiting). 12/05/18   Tish Men, MD  rosuvastatin (CRESTOR) 20 MG tablet TAKE 1 TABLET BY MOUTH EVERY DAY Patient taking differently: Take 20 mg by mouth daily.  04/04/18   Miquel Dunn, NP  silver sulfADIAZINE (SILVADENE) 1 % cream Apply pea-sized amount to wound daily. Patient taking differently: Apply 1 application topically daily as needed (wound care).  09/08/18   Evelina Bucy, DPM  sodium chloride (MURO 128) 5 % ophthalmic solution Place 1 drop into both eyes at bedtime.  08/31/18   [provider]  sucralfate (CARAFATE) 1 g tablet Take 1 tablet (1 g total) by mouth 3 (three) times daily after meals. 04/22/18   Pyrtle, Lajuan Lines, MD  TRESIBA FLEXTOUCH 100 UNIT/ML SOPN FlexTouch Pen Inject 20 Units into the skin every morning.  05/17/18   [provider]  XARELTO 2.5 MG TABS tablet TAKE 1 TABLET(2.5 MG) BY MOUTH TWICE DAILY Patient taking differently: Take 2.5 mg by mouth 2 (two) times daily.  01/10/19   Adrian Prows, MD    ALLERGIES:  Allergies  Allergen Reactions  . Atorvastatin Other (See Comments)    Caused muscles in her back to be very sore  . Gabapentin Swelling    Legs and feet swell  . Contrast Media [Iodinated Diagnostic Agents] Rash  . Nicorette [Nicotine] Nausea Only    SOCIAL HISTORY:  Social History   Tobacco Use  . Smoking status: Former Smoker    Packs/day: 0.30    Years: 44.00    Pack years: 13.20  Types: Cigarettes    Quit date: 03/26/2016    Years since quitting: 2.9  . Smokeless tobacco: Never Used  Substance Use Topics  . Alcohol use: Not Currently    Alcohol/week: 0.0 standard drinks    Comment: "i used to be a heavy drinker"    FAMILY HISTORY: Family History  Problem Relation Age of Onset  . Dementia Mother   . Colon cancer Neg Hx     EXAM: BP 138/76 (BP Location: Left Arm)   Pulse 81   Temp 98.4 F (36.9 C) (Oral)   Resp 16   LMP 02/22/2016 (LMP Unknown)   SpO2 98%  CONSTITUTIONAL: Alert and oriented and responds appropriately to questions.  Thin, appears older than stated age HEAD: Normocephalic EYES: Conjunctivae clear, pupils appear equal, EOM appear intact ENT: normal nose; moist mucous membranes, patient has an erythematous ulcerated lesion to the left posterior oropharynx involving the left tonsil, no uvular deviation, no stridor, no trismus or drooling, slightly hoarse voice NECK: Supple, normal ROM CARD: RRR; S1 and S2 appreciated; no murmurs, no clicks, no rubs, no gallops RESP: Normal chest excursion without splinting or tachypnea; breath sounds clear and equal bilaterally; no wheezes, no rhonchi, no rales, no hypoxia or respiratory distress, speaking full sentences ABD/GI: Normal bowel sounds; non-distended; soft, non-tender, no rebound, no guarding, no peritoneal  signs, no hepatosplenomegaly BACK:  The back appears normal EXT: Normal ROM in all joints; no deformity noted, no edema; no cyanosis SKIN: Normal color for age and race; warm; no rash on exposed skin NEURO: Moves all extremities equally PSYCH: The patient's mood and manner are appropriate.   MEDICAL DECISION MAKING: Patient here with throat pain secondary to squamous cell carcinoma of the left tonsil.  She has opted for palliative care and has refused chemotherapy or other intervention.  Her oncologist is unable to further prescribe narcotic pain medication given she is not being actively treated by them.  Her palliative care team is also unable to prescribe narcotic pain medication.  She has been referred back to her PCP.  It is unclear if she has followed up with her PCP.  Will give lidocaine and Percocet here for pain control.  Her blood sugars here have been within normal limits.  Will p.o. challenge once pain medication given.  Have advised patient to stop her [place] A[place] at this time given she has had multiple low blood sugars at home and has not been able to eat or drink well secondary to her throat cancer.  ED PROGRESS: Patient's pain has improved.  She has been able to eat and drink here.  Her blood glucose has been improving.  Again recommended that she stop her Tyler Aas and follow-up closely with her PCP for recommendations on further management of her diabetes as well as for further pain control.  Will discharge with prescription for reviewed morphine tablets for her pain secondary to her cancer.  She is being followed by palliative care.   At this time, I do not feel there is any life-threatening condition present. I have reviewed, interpreted and discussed all results (EKG, imaging, lab, urine as appropriate) and exam findings with patient/family. I have reviewed nursing notes and appropriate previous records.  I feel the patient is safe to be discharged home without further emergent workup and  can continue workup as an outpatient as needed. Discussed usual and customary return precautions. Patient/family verbalize understanding and are comfortable with this plan.  Outpatient follow-up has been provided as needed. All questions  have been answered.       Philis C Kalisz was evaluated in Emergency Department on 03/11/2019 for the symptoms described in the history of present illness. She was evaluated in the context of the global COVID-19 pandemic, which necessitated consideration that the patient might be at risk for infection with the SARS-CoV-2 virus that causes COVID-19. Institutional protocols and algorithms that pertain to the evaluation of patients at risk for COVID-19 are in a state of rapid change based on information released by regulatory bodies including the CDC and federal and state organizations. These policies and algorithms were followed during the patient's care in the ED.  Patient was seen wearing N95, face shield, gloves.    Ward, Delice Bison, DO 03/11/19 [phone]

## 2019-03-11 NOTE — Discharge Instructions (Addendum)
I recommend that you hold your [name] given you are not able to eat and drink due to pain from your throat cancer.  Please follow-up closely with your primary care doctor.    I recommend close follow-up with your palliative care team.  Your oncologist and palliative care team are unable to prescribe further pain medications.  You need to follow-up with your primary care physician for further pain management.

## 2019-03-15 ENCOUNTER — Other Ambulatory Visit: Payer: Medicare Other | Admitting: Hospice

## 2019-03-15 ENCOUNTER — Other Ambulatory Visit: Payer: Self-pay

## 2019-03-15 DIAGNOSIS — C099 Malignant neoplasm of tonsil, unspecified: Secondary | ICD-10-CM

## 2019-03-15 DIAGNOSIS — Z515 Encounter for palliative care: Secondary | ICD-10-CM

## 2019-03-15 NOTE — Progress Notes (Signed)
[facility] Consult Note Telephone: [phone]  Fax: [phone]  PATIENT NAME: [name] DOB: 11/28/1950 MRN: [id number]  PRIMARY CARE PROVIDER:   Nolene Ebbs, MD  REFERRING PROVIDER:Dr Tish Men RESPONSIBLE PARTY:[phone] Contact: Daughter, Paighton Godette [phone]  TELEHEALTH VISIT STATEMENT Due to the COVID-19 crisis, this visit was done via telephone from my office. It was initiated and consented to by this patient and/or family.  RECOMMENDATIONS and PLAN:  Advance Care Plan: Telehealth visit to build tust and discuss goals of care clarification. Patient affirmed she is a DNR, does not want any aggressive treatment. In her words, she said ' I keep refusing when they offer chemo because I know it is too late'. She said she has reestablished contact with Dr Jeanie Cooks as her PCP; she saw him yesterday and has appointment to return 04/12/2019. She endorsed willingness to discuss Hospice services in the future on the condition that she will not have many people coming to her house. Goals of care include to maximize quality of life and symptom management. Therapeutic listening and ample emotional support provided.  Symptom management:Report of low blood sugar last week  which patient attributed to soreness in her throat and not eating plenty as before. She said PCP lowered her Tresiba from 20 Units to 12 Units. She said she has been a diabetic for decades and know her body very well; she only takes the [place] when she eats well and thinks she needs it. She will continue doing what she is doing; it has been working well, with no complaint of hypoglycemia/hyperglycemia. For sore throat, she continues on Morphine and Lidocaine as ordered.  Patient said she was doing fairly well; denied pain at this time. Follow UQ:[id number] care will continue to follow patient for goals of care clarification and symptom management I spent37mnutes  providing this consultation. More than 50% of the time in this consultation was spent on coordinating communication.  HISTORY OF PRESENT ILLNESS:Kiara C Lawsis a 69y.o.year oldfemalewith multiple medical problems including Squamous cell carcinoma lf left tonsil, Type 2 DM, PAD, Pancreatitis.Palliative Care was asked to help address goals of care. CODE STATUS: DNR  PPS: 60% HOSPICE ELIGIBILITY/DIAGNOSIS: TBD  PAST MEDICAL HISTORY:  Past Medical History:  Diagnosis Date  . ANEMIA, CHRONIC DISEASE NEC 03/15/2006  . Carotid artery occlusion   . [place]   . Colon polyps   . Constipation due to opioid therapy 06/05/2014  . DEGENERATIVE JOINT DISEASE 05/06/2007   On going for >5 yrs No imaging to confirm Has tried ultram, mobic, neurontin which did not work [facility] once worked well    . Diabetes mellitus    Type II  . DIABETIC  RETINOPATHY 03/15/2006  . 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 Diastolic dysfunction 138/93/7342  Grade 1 by Echocardiogram 12/13/14  . Elevated liver enzymes   . Heart murmur   . Hemorrhoids   . History of kidney stones   . Hyperlipidemia 04/01/2011  . Hypertension   . Mild AI (aortic insufficiency)   . Moderate aortic regurgitation 09/25/2008   12/13/14 Echocardiogram    . Myalgia   . P[place] (peripheral artery disease) ([place]   . PANCREATITIS, CHRONIC 03/29/2006  . Peripheral arterial disease ([place]   . Pneumonia    age [age] . RENAL INSUFFICIENCY, CHRONIC 03/15/2006  . Tonsillar mass    Left    SOCIAL HX:  Social History   Tobacco Use  . Smoking status: Former Smoker  Packs/day: 0.30    Years: 44.00    Pack years: 13.20    Types: Cigarettes    Quit date: 03/26/2016    Years since quitting: 2.9  . Smokeless tobacco: Never Used  Substance Use Topics  . Alcohol use: Not Currently    Alcohol/week: 0.0 standard drinks    Comment: "i used to be a heavy drinker"    ALLERGIES:  Allergies  Allergen Reactions  . Atorvastatin  Other (See Comments)    Caused muscles in her back to be very sore  . Gabapentin Swelling    Legs and feet swell  . Contrast Media [Iodinated Diagnostic Agents] Rash  . Nicorette [Nicotine] Nausea Only     PERTINENT MEDICATIONS:  Outpatient Encounter Medications as of 03/15/2019  Medication Sig  . ACCU-CHEK AVIVA PLUS test strip TEST three times a day  . amLODipine (NORVASC) 10 MG tablet Take 10 mg by mouth daily.  Marland Kitchen aspirin EC 81 MG tablet Take 1 tablet (81 mg total) by mouth daily.  . Blood Glucose Monitoring Suppl (ACCU-CHEK AVIVA PLUS) w/Device KIT Use to check you blood sugar 3 times a day  . dexamethasone (DECADRON) 4 MG tablet Take 2 tablets (8 mg total) by mouth daily. Start the day after chemotherapy for 2 days. Take with food.  Marland Kitchen glipiZIDE (GLUCOTROL XL) 2.5 MG 24 hr tablet Take 2.5 mg by mouth every morning.  . lidocaine-prilocaine (EMLA) cream Apply to affected area once  . LINZESS 145 MCG CAPS capsule Take 145 mcg by mouth daily.   . meclizine (ANTIVERT) 25 MG tablet Take 1 tablet (25 mg total) by mouth 3 (three) times daily as needed for dizziness.  Marland Kitchen morphine (MSIR) 15 MG tablet Take 1 tablet (15 mg total) by mouth every 6 (six) hours as needed for severe pain.  Marland Kitchen Olopatadine HCl 0.2 % SOLN   . omeprazole (PRILOSEC) 40 MG capsule Take 1 capsule (40 mg total) by mouth daily. Patient needs office visit for further refills  . ondansetron (ZOFRAN) 8 MG tablet Take 1 tablet (8 mg total) by mouth 2 (two) times daily as needed for refractory nausea / vomiting. Start on day 3 after chemotherapy.  . Pancrelipase, Lip-Prot-Amyl, 24000-76000 units CPEP Take 4 capsules (96,000 Units total) by mouth See admin instructions. Take 4 capsules by mouth 2-3 times daily before meals (Patient taking differently: Take 2-4 capsules by mouth See admin instructions. Take 4 capsules by mouth two to three times a day with meals and 2 capsules with any snacks)  . PAZEO 0.7 % SOLN Place 1 drop into both  eyes at bedtime.   . pregabalin (LYRICA) 75 MG capsule Take 1 capsule (75 mg total) by mouth daily.  Marland Kitchen PROAIR HFA 108 (90 Base) MCG/ACT inhaler Inhale 2 puffs into the lungs 4 (four) times daily as needed for wheezing or shortness of breath.  . prochlorperazine (COMPAZINE) 10 MG tablet Take 1 tablet (10 mg total) by mouth every 6 (six) hours as needed (Nausea or vomiting).  . rosuvastatin (CRESTOR) 20 MG tablet TAKE 1 TABLET BY MOUTH EVERY DAY (Patient taking differently: Take 20 mg by mouth daily. )  . silver sulfADIAZINE (SILVADENE) 1 % cream Apply pea-sized amount to wound daily. (Patient taking differently: Apply 1 application topically daily as needed (wound care). )  . sodium chloride (MURO 128) 5 % ophthalmic solution Place 1 drop into both eyes at bedtime.   . sucralfate (CARAFATE) 1 g tablet Take 1 tablet (1 g total) by mouth  3 (three) times daily after meals.  Tyler Aas FLEXTOUCH 100 UNIT/ML SOPN FlexTouch Pen Inject 20 Units into the skin every morning.   Alveda Reasons 2.5 MG TABS tablet TAKE 1 TABLET(2.5 MG) BY MOUTH TWICE DAILY (Patient taking differently: Take 2.5 mg by mouth 2 (two) times daily. )   No facility-administered encounter medications on file as of 03/15/2019.    Teodoro Spray, NP

## 2019-03-17 ENCOUNTER — Telehealth: Payer: Self-pay

## 2019-03-17 NOTE — Telephone Encounter (Signed)
Received message that patient had called and requested a return call. Attempted to call patient x2, no answer.

## 2019-03-21 ENCOUNTER — Encounter (HOSPITAL_COMMUNITY): Payer: Medicare Other

## 2019-03-21 ENCOUNTER — Telehealth: Payer: Self-pay

## 2019-03-21 ENCOUNTER — Other Ambulatory Visit: Payer: Self-pay

## 2019-03-21 ENCOUNTER — Ambulatory Visit (HOSPITAL_COMMUNITY)
Admission: RE | Admit: 2019-03-21 | Discharge: 2019-03-21 | Disposition: A | Discharge status: Home / Self Care | Payer: Medicare Other | Source: Ambulatory Visit | Attending: Nephrology | Admitting: Nephrology

## 2019-03-21 VITALS — BP 115/56 | HR 72 | Temp 96.5°F | Resp 18

## 2019-03-21 DIAGNOSIS — D638 Anemia in other chronic diseases classified elsewhere: Secondary | ICD-10-CM | POA: Diagnosis present

## 2019-03-21 LAB — POCT HEMOGLOBIN-HEMACUE: Hemoglobin: 9.9 g/dL — ABNORMAL LOW (ref 12.0–15.0)

## 2019-03-21 MED ORDER — EPOETIN ALFA 10000 UNIT/ML IJ SOLN
10000.0000 [IU] | INTRAMUSCULAR | Status: DC
  Administered 2019-03-21: 10000 [IU] via SUBCUTANEOUS

## 2019-03-21 MED ORDER — EPOETIN ALFA 10000 UNIT/ML IJ SOLN
INTRAMUSCULAR | Status: AC
  Filled 2019-03-21: qty 1

## 2019-03-21 NOTE — Telephone Encounter (Signed)
Received return message from patient. Phone call placed to patient who requested to schedule a visit with Windy Canny NP. Patient reported continued pain to throat. Visit scheduled for Thursday 03/23/19

## 2019-03-23 ENCOUNTER — Other Ambulatory Visit: Payer: Self-pay

## 2019-03-23 ENCOUNTER — Other Ambulatory Visit: Payer: Medicare Other | Admitting: Hospice

## 2019-03-23 DIAGNOSIS — C099 Malignant neoplasm of tonsil, unspecified: Secondary | ICD-10-CM

## 2019-03-23 DIAGNOSIS — Z515 Encounter for palliative care: Secondary | ICD-10-CM

## 2019-03-23 NOTE — Progress Notes (Signed)
[facility] Consult Note Telephone: [phone]  Fax: [phone]  PATIENT NAME: [name] DOB: 02/02/1951 MRN: [id number]  PRIMARY CARE PROVIDER:   Nolene Ebbs, MD  REFERRING PROVIDER:  Nolene Ebbs, MD [address] [place],  [place] [address]  REFERRING PROVIDER:Dr Tish Men RESPONSIBLE PARTY:[phone] Contact: Daughter, Miko Markwood [phone]  RECOMMENDATIONS and PLAN: Advance Care Plan: Visit following patient's request to build trust and provide answers to her questions. Patient wanted to know how she will be getting her pain medications now that she has gone to see her PCP; she reestablished contact with Dr Jeanie Cooks as her PCP; she saw him 03/15/2019 and to return 04/12/2019. She was advised that her PCP will provide her pain medications if needed. Per Oncology notes: patient has refused chemotherapy and has missed many appointments with ENT and oncology; given they are not actively treating patient they have recommended she follow-up with her PCP for further pain management.  She asked if she will be seeing her Oncologist since she has refused further aggressive chemo and radiation. She was advised to keep any Oncology appointment and  that her PCP can refer her to see Oncologist as indicated. Patient affirmed she is a DNR, does not want any aggressive treatment. In her words, she said ' I keep refusing when they offer chemo because I know it is too late'. She endorsed willingness to discuss Hospice services in the future.Goals of care include to maximize quality of life and symptom management. Therapeutic listening and emotional support provided.  Symptom management:Patient said the new medication from PCP -  Lidocaine viscous 2% 3cc swish/swallow as needed for mouth/throat soreness is helpful. She denied pain during visit and said she is now able to eat food as long as she uses the Lidocaine.  Report of low blood sugar  last week  which patient attributed to soreness in her throat and not eating plenty as before.She continues on reduced dose of Tresiba -  12 Units and sometimes she skips it because she said "I have been a diabetic for decades and I know my body best". Patient in no acute distress; denied n/v, respiratory distress. Follow SW:[id number] care will continue to follow patient for goals of care clarification and symptom management I spent37mnutes providing this consultation- chart review, documentation included.  More than 50% of the time in this consultation was spent on coordinating communication.  HISTORY OF PRESENT ILLNESS:Kiara C Lawsis a 69y.o.year oldfemalewith multiple medical problems including Squamous cell carcinoma lf left tonsil, Type 2 DM, PAD, Pancreatitis.Palliative Care was asked to help address goals of care.  CODE STATUS: DNR  PPS: 60% HOSPICE ELIGIBILITY/DIAGNOSIS: TBD  PAST MEDICAL HISTORY:  Past Medical History:  Diagnosis Date  . ANEMIA, CHRONIC DISEASE NEC 03/15/2006  . Carotid artery occlusion   . Cirrhosis ([place]   . Colon polyps   . Constipation due to opioid therapy 06/05/2014  . DEGENERATIVE JOINT DISEASE 05/06/2007   On going for >5 yrs No imaging to confirm Has tried ultram, mobic, neurontin which did not work [facility] once worked well    . Diabetes mellitus    Type II  . DIABETIC  RETINOPATHY 03/15/2006  . 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 Diastolic dysfunction 166/59/9357  Grade 1 by Echocardiogram 12/13/14  . Elevated liver enzymes   . Heart murmur   . Hemorrhoids   . History of kidney stones   . Hyperlipidemia 04/01/2011  . Hypertension   . Mild  AI (aortic insufficiency)   . Moderate aortic regurgitation 09/25/2008   12/13/14 Echocardiogram    . Myalgia   . PAD (peripheral artery disease) ([place])   . PANCREATITIS, CHRONIC 03/29/2006  . Peripheral arterial disease ([place])   . Pneumonia    age [age]  . RENAL INSUFFICIENCY, CHRONIC  03/15/2006  . Tonsillar mass    Left    SOCIAL HX:  Social History   Tobacco Use  . Smoking status: Former Smoker    Packs/day: 0.30    Years: 44.00    Pack years: 13.20    Types: Cigarettes    Quit date: 03/26/2016    Years since quitting: 2.9  . Smokeless tobacco: Never Used  Substance Use Topics  . Alcohol use: Not Currently    Alcohol/week: 0.0 standard drinks    Comment: "i used to be a heavy drinker"    ALLERGIES:  Allergies  Allergen Reactions  . Atorvastatin Other (See Comments)    Caused muscles in her back to be very sore  . Gabapentin Swelling    Legs and feet swell  . Contrast Media [Iodinated Diagnostic Agents] Rash  . Nicorette [Nicotine] Nausea Only     PERTINENT MEDICATIONS:  Outpatient Encounter Medications as of 03/23/2019  Medication Sig  . ACCU-CHEK AVIVA PLUS test strip TEST three times a day  . amLODipine (NORVASC) 10 MG tablet Take 10 mg by mouth daily.  Marland Kitchen aspirin EC 81 MG tablet Take 1 tablet (81 mg total) by mouth daily.  . Blood Glucose Monitoring Suppl (ACCU-CHEK AVIVA PLUS) w/Device KIT Use to check you blood sugar 3 times a day  . dexamethasone (DECADRON) 4 MG tablet Take 2 tablets (8 mg total) by mouth daily. Start the day after chemotherapy for 2 days. Take with food.  Marland Kitchen glipiZIDE (GLUCOTROL XL) 2.5 MG 24 hr tablet Take 2.5 mg by mouth every morning.  . lidocaine-prilocaine (EMLA) cream Apply to affected area once  . LINZESS 145 MCG CAPS capsule Take 145 mcg by mouth daily.   . meclizine (ANTIVERT) 25 MG tablet Take 1 tablet (25 mg total) by mouth 3 (three) times daily as needed for dizziness.  Marland Kitchen morphine (MSIR) 15 MG tablet Take 1 tablet (15 mg total) by mouth every 6 (six) hours as needed for severe pain.  Marland Kitchen Olopatadine HCl 0.2 % SOLN   . omeprazole (PRILOSEC) 40 MG capsule Take 1 capsule (40 mg total) by mouth daily. Patient needs office visit for further refills  . ondansetron (ZOFRAN) 8 MG tablet Take 1 tablet (8 mg total) by mouth 2  (two) times daily as needed for refractory nausea / vomiting. Start on day 3 after chemotherapy.  . Pancrelipase, Lip-Prot-Amyl, 24000-76000 units CPEP Take 4 capsules (96,000 Units total) by mouth See admin instructions. Take 4 capsules by mouth 2-3 times daily before meals (Patient taking differently: Take 2-4 capsules by mouth See admin instructions. Take 4 capsules by mouth two to three times a day with meals and 2 capsules with any snacks)  . PAZEO 0.7 % SOLN Place 1 drop into both eyes at bedtime.   . pregabalin (LYRICA) 75 MG capsule Take 1 capsule (75 mg total) by mouth daily.  Marland Kitchen PROAIR HFA 108 (90 Base) MCG/ACT inhaler Inhale 2 puffs into the lungs 4 (four) times daily as needed for wheezing or shortness of breath.  . prochlorperazine (COMPAZINE) 10 MG tablet Take 1 tablet (10 mg total) by mouth every 6 (six) hours as needed (Nausea or vomiting).  Marland Kitchen  rosuvastatin (CRESTOR) 20 MG tablet TAKE 1 TABLET BY MOUTH EVERY DAY (Patient taking differently: Take 20 mg by mouth daily. )  . silver sulfADIAZINE (SILVADENE) 1 % cream Apply pea-sized amount to wound daily. (Patient taking differently: Apply 1 application topically daily as needed (wound care). )  . sodium chloride (MURO 128) 5 % ophthalmic solution Place 1 drop into both eyes at bedtime.   . sucralfate (CARAFATE) 1 g tablet Take 1 tablet (1 g total) by mouth 3 (three) times daily after meals.  Tyler Aas FLEXTOUCH 100 UNIT/ML SOPN FlexTouch Pen Inject 20 Units into the skin every morning.   Alveda Reasons 2.5 MG TABS tablet TAKE 1 TABLET(2.5 MG) BY MOUTH TWICE DAILY (Patient taking differently: Take 2.5 mg by mouth 2 (two) times daily. )   No facility-administered encounter medications on file as of 03/23/2019.    PHYSICAL EXAM/ROS:   General: NAD, FLACC 0 Cardiovascular: regular rate and rhythm Pulmonary: clear ant/post fields; normal respiratory effort Abdomen: soft, nontender, + bowel sounds GU: no suprapubic tenderness Extremities: no  edema, no joint deformities Skin: no rashes to exposed skin Neurological: alert and oriented x 4  Teodoro Spray, NP

## 2019-03-27 ENCOUNTER — Telehealth: Payer: Self-pay | Admitting: Hospice

## 2019-03-27 NOTE — Telephone Encounter (Signed)
Patient called asking for help to reach her PCP. She said she had called requesting for her pain medication Morphine; no call back yet. NP called PCP's office and left voicemail with call back number, informing that patient is in need of prescription for her pain medication Morphine(MSIR) 15mg  every 6 hours PRN severe pain to be sent to her pharmacy on file.

## 2019-03-29 ENCOUNTER — Telehealth: Payer: Self-pay

## 2019-03-29 NOTE — Telephone Encounter (Signed)
Phone call placed to PCP office. Office currently closed. Will reach out again

## 2019-03-29 NOTE — Telephone Encounter (Signed)
Phone call received from patient's daughter requesting to schedule a visit with Windy Canny NP. Patient continues to have pain and prescribed pain medications are not always effective. Spoke with Windy Canny NP who directed to reach out to PCP as patient/daughter was having difficulty connecting with office.

## 2019-03-29 NOTE — Telephone Encounter (Signed)
Spoke with daughter to provide update that [place] NP had reached out to PCP office on 03/28/2019. Daughter shared that pain medication as ordered has not been effective in managing her pain.Daneen Schick NP who directed to reach out to PCP office

## 2019-03-30 ENCOUNTER — Telehealth: Payer: Self-pay

## 2019-03-30 ENCOUNTER — Other Ambulatory Visit: Payer: Self-pay

## 2019-03-30 ENCOUNTER — Other Ambulatory Visit: Payer: Medicare Other | Admitting: Hospice

## 2019-03-30 DIAGNOSIS — C099 Malignant neoplasm of tonsil, unspecified: Secondary | ICD-10-CM

## 2019-03-30 DIAGNOSIS — Z515 Encounter for palliative care: Secondary | ICD-10-CM

## 2019-03-30 NOTE — Telephone Encounter (Signed)
Phone call placed to PCP office, Spoke with Darden Dates, to make her aware of concerns of unmanaged pain as verbalized by patient's daughter. Tee to relay message to nurse. Also noted that patient's daughter has also called PCP office to report pain.

## 2019-03-30 NOTE — Progress Notes (Addendum)
[facility] Consult Note Telephone: [phone]  Fax: [phone]  PATIENT NAME: [name] DOB: 14-Jun-1950 MRN: [id number]  PRIMARY CARE PROVIDER:   Nolene Ebbs, MD  REFERRING PROVIDER:Dr Tish Men RESPONSIBLE PARTY:[phone] Contact: Daughter, Cherril Hett [phone] ASSESSMENT:        RECOMMENDATIONS/PLAN:  Patient with squamous cell left tonsil CA, has refused all treatments and continues to decline in functional status; significant decline since last week, PPS down from 60% to 50%. Patient with worsening pain and requesting Hospice services. She said she would like to remain in her home. [facility] CMO endorsed patient qualifies for Hospice services; contact made with PCP by [facility] clinical navigator; PCP in agreement. Patient is a DNR; goals of care include to maximize quality of life.  Symptom Management: Patient with worsening pain; no Morphine on hand and efforts to get prescription not fruitful. NP will send prescription to [facility] [address] MSIR 10m every 4 hours PRN severe pain x 5 days, no refill. Patient in agreement; expressed thanks.  I spent 90 minutes providing this consultation and Hospice referral. Time including chart review, documentation.  More than 50% of the time in this consultation was spent coordinating communication.   HISTORY OF PRESENT ILLNESS:Kiara C Lawsis a 69y.o.year oldfemalewith multiple medical problems including Squamous cell carcinoma lf left tonsil, Type 2 DM, PAD, Pancreatitis.Palliative Care was asked to help address goals of care.   CODE STATUS: DNR  PPS: 50% HOSPICE ELIGIBILITY/DIAGNOSIS: TBD  PAST MEDICAL HISTORY:  Past Medical History:  Diagnosis Date  . ANEMIA, CHRONIC DISEASE NEC 03/15/2006  . Carotid artery occlusion   . Cirrhosis ([place]   . Colon polyps   . Constipation due to opioid therapy 06/05/2014  . DEGENERATIVE JOINT DISEASE 05/06/2007   On going  for >5 yrs No imaging to confirm Has tried ultram, mobic, neurontin which did not work [facility] once worked well    . Diabetes mellitus    Type II  . DIABETIC  RETINOPATHY 03/15/2006  . 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 Diastolic dysfunction 133/54/5625  Grade 1 by Echocardiogram 12/13/14  . Elevated liver enzymes   . Heart murmur   . Hemorrhoids   . History of kidney stones   . Hyperlipidemia 04/01/2011  . Hypertension   . Mild AI (aortic insufficiency)   . Moderate aortic regurgitation 09/25/2008   12/13/14 Echocardiogram    . Myalgia   . [place] disease) ([place]   . PANCREATITIS, CHRONIC 03/29/2006  . [place] arterial disease [place]   . Pneumonia    age [age] . RENAL INSUFFICIENCY, CHRONIC 03/15/2006  . Tonsillar mass    Left    SOCIAL HX:  Social History   Tobacco Use  . Smoking status: Former Smoker    Packs/day: 0.30    Years: 44.00    Pack years: 13.20    Types: Cigarettes    Quit date: 03/26/2016    Years since quitting: 3.0  . Smokeless tobacco: Never Used  Substance Use Topics  . Alcohol use: Not Currently    Alcohol/week: 0.0 standard drinks    Comment: "i used to be a heavy drinker"    ALLERGIES:  Allergies  Allergen Reactions  . Atorvastatin Other (See Comments)    Caused muscles in her back to be very sore  . Gabapentin Swelling    Legs and feet swell  . Contrast Media [Iodinated Diagnostic Agents] Rash  . Nicorette [Nicotine] Nausea Only  PERTINENT MEDICATIONS:  Outpatient Encounter Medications as of 03/30/2019  Medication Sig  . ACCU-CHEK AVIVA PLUS test strip TEST three times a day  . amLODipine (NORVASC) 10 MG tablet Take 10 mg by mouth daily.  Marland Kitchen aspirin EC 81 MG tablet Take 1 tablet (81 mg total) by mouth daily.  . Blood Glucose Monitoring Suppl (ACCU-CHEK AVIVA PLUS) w/Device KIT Use to check you blood sugar 3 times a day  . dexamethasone (DECADRON) 4 MG tablet Take 2 tablets (8 mg total) by mouth daily. Start the  day after chemotherapy for 2 days. Take with food.  Marland Kitchen glipiZIDE (GLUCOTROL XL) 2.5 MG 24 hr tablet Take 2.5 mg by mouth every morning.  . lidocaine-prilocaine (EMLA) cream Apply to affected area once  . LINZESS 145 MCG CAPS capsule Take 145 mcg by mouth daily.   . meclizine (ANTIVERT) 25 MG tablet Take 1 tablet (25 mg total) by mouth 3 (three) times daily as needed for dizziness.  Marland Kitchen morphine (MSIR) 15 MG tablet Take 1 tablet (15 mg total) by mouth every 6 (six) hours as needed for severe pain.  Marland Kitchen Olopatadine HCl 0.2 % SOLN   . omeprazole (PRILOSEC) 40 MG capsule Take 1 capsule (40 mg total) by mouth daily. Patient needs office visit for further refills  . ondansetron (ZOFRAN) 8 MG tablet Take 1 tablet (8 mg total) by mouth 2 (two) times daily as needed for refractory nausea / vomiting. Start on day 3 after chemotherapy.  . Pancrelipase, Lip-Prot-Amyl, 24000-76000 units CPEP Take 4 capsules (96,000 Units total) by mouth See admin instructions. Take 4 capsules by mouth 2-3 times daily before meals (Patient taking differently: Take 2-4 capsules by mouth See admin instructions. Take 4 capsules by mouth two to three times a day with meals and 2 capsules with any snacks)  . PAZEO 0.7 % SOLN Place 1 drop into both eyes at bedtime.   . pregabalin (LYRICA) 75 MG capsule Take 1 capsule (75 mg total) by mouth daily.  Marland Kitchen PROAIR HFA 108 (90 Base) MCG/ACT inhaler Inhale 2 puffs into the lungs 4 (four) times daily as needed for wheezing or shortness of breath.  . prochlorperazine (COMPAZINE) 10 MG tablet Take 1 tablet (10 mg total) by mouth every 6 (six) hours as needed (Nausea or vomiting).  . rosuvastatin (CRESTOR) 20 MG tablet TAKE 1 TABLET BY MOUTH EVERY DAY (Patient taking differently: Take 20 mg by mouth daily. )  . silver sulfADIAZINE (SILVADENE) 1 % cream Apply pea-sized amount to wound daily. (Patient taking differently: Apply 1 application topically daily as needed (wound care). )  . sodium chloride (MURO  128) 5 % ophthalmic solution Place 1 drop into both eyes at bedtime.   . sucralfate (CARAFATE) 1 g tablet Take 1 tablet (1 g total) by mouth 3 (three) times daily after meals.  Tyler Aas FLEXTOUCH 100 UNIT/ML SOPN FlexTouch Pen Inject 20 Units into the skin every morning.   Alveda Reasons 2.5 MG TABS tablet TAKE 1 TABLET(2.5 MG) BY MOUTH TWICE DAILY (Patient taking differently: Take 2.5 mg by mouth 2 (two) times daily. )   No facility-administered encounter medications on file as of 03/30/2019.    Teodoro Spray, NP

## 2019-03-30 NOTE — Telephone Encounter (Signed)
Received phone call from the nurse at Dr. Santiago Bur office who stated that he had spoken to the patient and directed her to contact the oncologist for pain medication. Explained to the PCP office that patient is no longer receiving treatment with oncologist. PCP office directed patient to make an appointment with Dr. Lorette Ang office for follow up.

## 2019-03-30 NOTE — Telephone Encounter (Signed)
Received phone call from patient's daughter who requesting to speak with Palliative NP. Will relay message

## 2019-03-30 NOTE — Telephone Encounter (Signed)
At the direction of Windy Canny NP, PCP office contacted to request an order for hospice eval and to confirm that he will remain the attending. Received verbal order and Dr. Jeanie Cooks will remain attending. Windy Canny NP updated

## 2019-03-30 NOTE — Telephone Encounter (Signed)
Received call from patient who wanted Livina NP to be updated that she has increased pain to left side, rating 10/10. She has not had any pain medication since Tuesday 03/28/2019. Patient remains ambulatory but is only able to walk very slowly. Daneen Schick NP who requested this RN to reach out to the oncologist and PCP office to request a script for her pain medication until she can schedule an appointment for pain management.

## 2019-04-04 ENCOUNTER — Ambulatory Visit (HOSPITAL_COMMUNITY)
Admission: RE | Admit: 2019-04-04 | Discharge: 2019-04-04 | Disposition: A | Discharge status: Home / Self Care | Payer: Medicare Other | Source: Ambulatory Visit | Attending: Nephrology | Admitting: Nephrology

## 2019-04-04 ENCOUNTER — Other Ambulatory Visit: Payer: Self-pay

## 2019-04-05 ENCOUNTER — Ambulatory Visit (HOSPITAL_COMMUNITY)
Admission: RE | Admit: 2019-04-05 | Discharge: 2019-04-05 | Disposition: A | Discharge status: Home / Self Care | Payer: Medicare Other | Source: Ambulatory Visit | Attending: Nephrology | Admitting: Nephrology

## 2019-04-05 VITALS — BP 128/53 | HR 84 | Temp 96.3°F | Resp 18

## 2019-04-05 DIAGNOSIS — D638 Anemia in other chronic diseases classified elsewhere: Secondary | ICD-10-CM

## 2019-04-05 LAB — IRON AND TIBC
Iron: 25 ug/dL — ABNORMAL LOW (ref 28–170)
Saturation Ratios: 15 % (ref 10.4–31.8)
TIBC: 172 ug/dL — ABNORMAL LOW (ref 250–450)
UIBC: 147 ug/dL

## 2019-04-05 LAB — FERRITIN: Ferritin: 71 ng/mL (ref 11–307)

## 2019-04-05 LAB — POCT HEMOGLOBIN-HEMACUE: Hemoglobin: 9.8 g/dL — ABNORMAL LOW (ref 12.0–15.0)

## 2019-04-05 MED ORDER — EPOETIN ALFA 10000 UNIT/ML IJ SOLN
10000.0000 [IU] | INTRAMUSCULAR | Status: DC
  Administered 2019-04-05: 10000 [IU] via SUBCUTANEOUS

## 2019-04-05 MED ORDER — EPOETIN ALFA 10000 UNIT/ML IJ SOLN
INTRAMUSCULAR | Status: AC
  Filled 2019-04-05: qty 1

## 2019-04-06 ENCOUNTER — Ambulatory Visit: Payer: Medicare Other | Admitting: Podiatry

## 2019-04-09 ENCOUNTER — Other Ambulatory Visit: Payer: Self-pay | Admitting: Cardiology

## 2019-04-09 ENCOUNTER — Other Ambulatory Visit: Payer: Self-pay | Admitting: Internal Medicine

## 2019-04-13 ENCOUNTER — Other Ambulatory Visit: Payer: Self-pay | Admitting: Internal Medicine

## 2019-04-14 ENCOUNTER — Other Ambulatory Visit: Payer: Self-pay | Admitting: Internal Medicine

## 2019-04-14 ENCOUNTER — Ambulatory Visit: Payer: Medicare Other | Admitting: Podiatry

## 2019-04-18 ENCOUNTER — Encounter (HOSPITAL_COMMUNITY): Payer: Medicare Other

## 2019-04-20 ENCOUNTER — Ambulatory Visit (HOSPITAL_COMMUNITY)
Admission: RE | Admit: 2019-04-20 | Discharge: 2019-04-20 | Disposition: A | Discharge status: Home / Self Care | Payer: Medicare Other | Source: Ambulatory Visit | Attending: Nephrology | Admitting: Nephrology

## 2019-04-20 ENCOUNTER — Other Ambulatory Visit: Payer: Self-pay

## 2019-04-20 VITALS — BP 103/49 | HR 82 | Temp 97.3°F | Resp 18

## 2019-04-20 DIAGNOSIS — D638 Anemia in other chronic diseases classified elsewhere: Secondary | ICD-10-CM | POA: Diagnosis present

## 2019-04-20 LAB — POCT HEMOGLOBIN-HEMACUE: Hemoglobin: 10 g/dL — ABNORMAL LOW (ref 12.0–15.0)

## 2019-04-20 MED ORDER — EPOETIN ALFA 10000 UNIT/ML IJ SOLN
10000.0000 [IU] | INTRAMUSCULAR | Status: DC
  Administered 2019-04-20: 10000 [IU] via SUBCUTANEOUS

## 2019-04-20 MED ORDER — EPOETIN ALFA 10000 UNIT/ML IJ SOLN
INTRAMUSCULAR | Status: AC
  Filled 2019-04-20: qty 1

## 2019-04-21 ENCOUNTER — Ambulatory Visit (INDEPENDENT_AMBULATORY_CARE_PROVIDER_SITE_OTHER): Payer: Medicare Other | Admitting: Podiatry

## 2019-04-21 DIAGNOSIS — Z5329 Procedure and treatment not carried out because of patient's decision for other reasons: Secondary | ICD-10-CM

## 2019-04-21 NOTE — Progress Notes (Signed)
No show for appt. 

## 2019-05-01 ENCOUNTER — Other Ambulatory Visit: Payer: Self-pay | Admitting: Hematology

## 2019-05-01 DIAGNOSIS — C099 Malignant neoplasm of tonsil, unspecified: Secondary | ICD-10-CM

## 2019-05-04 ENCOUNTER — Ambulatory Visit (HOSPITAL_COMMUNITY)
Admission: RE | Admit: 2019-05-04 | Discharge: 2019-05-04 | Disposition: A | Discharge status: Home / Self Care | Payer: Medicare Other | Source: Ambulatory Visit | Attending: Nephrology | Admitting: Nephrology

## 2019-05-04 VITALS — BP 98/53 | HR 87 | Temp 98.7°F | Resp 20

## 2019-05-04 DIAGNOSIS — D638 Anemia in other chronic diseases classified elsewhere: Secondary | ICD-10-CM | POA: Diagnosis present

## 2019-05-04 LAB — IRON AND TIBC
Iron: 24 ug/dL — ABNORMAL LOW (ref 28–170)
Saturation Ratios: 15 % (ref 10.4–31.8)
TIBC: 161 ug/dL — ABNORMAL LOW (ref 250–450)
UIBC: 137 ug/dL

## 2019-05-04 LAB — FERRITIN: Ferritin: 84 ng/mL (ref 11–307)

## 2019-05-04 LAB — POCT HEMOGLOBIN-HEMACUE: Hemoglobin: 9.7 g/dL — ABNORMAL LOW (ref 12.0–15.0)

## 2019-05-04 MED ORDER — EPOETIN ALFA 10000 UNIT/ML IJ SOLN
INTRAMUSCULAR | Status: AC
  Filled 2019-05-04: qty 1

## 2019-05-04 MED ORDER — EPOETIN ALFA 10000 UNIT/ML IJ SOLN
10000.0000 [IU] | INTRAMUSCULAR | Status: DC
  Administered 2019-05-04: 10000 [IU] via SUBCUTANEOUS

## 2019-05-09 ENCOUNTER — Other Ambulatory Visit: Payer: Self-pay | Admitting: Cardiology

## 2019-05-09 NOTE — Progress Notes (Signed)
Subjective:  Patient ID: [name], female    DOB: [birth year],  MRN: [id number]  Chief Complaint  Patient presents with  . Foot Pain    pt is here for a wound check of both feet, pt states that she is doing a lot better since the last time she was here, pt also states that she will often have a hard time putting on shoes    69 y.o. female presents for wound care. Hx as above.   Review of Systems: Negative except as noted in the HPI. Denies N/V/F/Ch.  Past Medical History:  Diagnosis Date  . ANEMIA, CHRONIC DISEASE NEC 03/15/2006  . Carotid artery occlusion   . Cirrhosis ([place])   . Colon polyps   . Constipation due to opioid therapy 06/05/2014  . DEGENERATIVE JOINT DISEASE 05/06/2007   On going for >5 yrs No imaging to confirm Has tried ultram, mobic, neurontin which did not work Ambulance person tried once worked well    . Diabetes mellitus    Type II  . DIABETIC  RETINOPATHY 03/15/2006  . 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 Diastolic dysfunction 29/47/6546   Grade 1 by Echocardiogram 12/13/14  . Elevated liver enzymes   . Heart murmur   . Hemorrhoids   . History of kidney stones   . Hyperlipidemia 04/01/2011  . Hypertension   . Mild AI (aortic insufficiency)   . Moderate aortic regurgitation 09/25/2008   12/13/14 Echocardiogram    . Myalgia   . PAD (peripheral artery disease) ([place])   . PANCREATITIS, CHRONIC 03/29/2006  . Peripheral arterial disease ([place])   . Pneumonia    age 75-21  . RENAL INSUFFICIENCY, CHRONIC 03/15/2006  . Tonsillar mass    Left    Current Outpatient Medications:  .  ACCU-CHEK AVIVA PLUS test strip, TEST three times a day, Disp: 100 each, Rfl: 11 .  amLODipine (NORVASC) 10 MG tablet, Take 10 mg by mouth daily., Disp: , Rfl:  .  aspirin EC 81 MG tablet, Take 1 tablet (81 mg total) by mouth daily., Disp: , Rfl:  .  Blood Glucose Monitoring Suppl (ACCU-CHEK AVIVA PLUS) w/Device KIT, Use to check you blood sugar 3 times a day, Disp: 1 kit, Rfl: 1 .   LINZESS 145 MCG CAPS capsule, Take 145 mcg by mouth daily. , Disp: , Rfl:  .  Pancrelipase, Lip-Prot-Amyl, 24000-76000 units CPEP, Take 4 capsules (96,000 Units total) by mouth See admin instructions. Take 4 capsules by mouth 2-3 times daily before meals (Patient taking differently: Take 2-4 capsules by mouth See admin instructions. Take 4 capsules by mouth two to three times a day with meals and 2 capsules with any snacks), Disp: 1170 capsule, Rfl: 3 .  pregabalin (LYRICA) 75 MG capsule, Take 1 capsule (75 mg total) by mouth daily., Disp: 60 capsule, Rfl: 0 .  PROAIR HFA 108 (90 Base) MCG/ACT inhaler, Inhale 2 puffs into the lungs 4 (four) times daily as needed for wheezing or shortness of breath., Disp: , Rfl: 3 .  silver sulfADIAZINE (SILVADENE) 1 % cream, Apply pea-sized amount to wound daily. (Patient taking differently: Apply 1 application topically daily as needed (wound care). ), Disp: 50 g, Rfl: 0 .  sodium chloride (MURO 128) 5 % ophthalmic solution, Place 1 drop into both eyes at bedtime. , Disp: , Rfl:  .  sucralfate (CARAFATE) 1 g tablet, Take 1 tablet (1 g total) by mouth 3 (three) times daily after meals., Disp: 60 tablet, Rfl: 0 .  TRESIBA FLEXTOUCH 100 UNIT/ML SOPN FlexTouch Pen, Inject 20 Units into the skin [name] morning. , Disp: , Rfl:  .  dexamethasone (DECADRON) 4 MG tablet, Take 2 tablets (8 mg total) by mouth daily. Start the day after chemotherapy for 2 days. Take with food., Disp: 30 tablet, Rfl: 1 .  glipiZIDE (GLUCOTROL XL) 2.5 MG 24 hr tablet, Take 2.5 mg by mouth [name] morning., Disp: , Rfl:  .  lidocaine-prilocaine (EMLA) cream, Apply to affected area once, Disp: 30 g, Rfl: 3 .  meclizine (ANTIVERT) 25 MG tablet, Take 1 tablet (25 mg total) by mouth 3 (three) times daily as needed for dizziness., Disp: 30 tablet, Rfl: 0 .  Olopatadine HCl 0.2 % SOLN, , Disp: , Rfl:  .  omeprazole (PRILOSEC) 40 MG capsule, Take 1 capsule (40 mg total) by mouth daily., Disp: 90 capsule, Rfl:  0 .  ondansetron (ZOFRAN) 8 MG tablet, Take 1 tablet (8 mg total) by mouth 2 (two) times daily as needed for refractory nausea / vomiting. Start on day 3 after chemotherapy., Disp: 30 tablet, Rfl: 1 .  PAZEO 0.7 % SOLN, Place 1 drop into both eyes at bedtime. , Disp: , Rfl:  .  prochlorperazine (COMPAZINE) 10 MG tablet, Take 1 tablet (10 mg total) by mouth [name] 6 (six) hours as needed (Nausea or vomiting)., Disp: 30 tablet, Rfl: 1 .  rosuvastatin (CRESTOR) 20 MG tablet, Take 1 tablet (20 mg total) by mouth daily., Disp: 90 tablet, Rfl: 1 .  XARELTO 2.5 MG TABS tablet, TAKE 1 TABLET(2.5 MG) BY MOUTH TWICE DAILY, Disp: 180 tablet, Rfl: 1  Social History   Tobacco Use  Smoking Status Former Smoker  . Packs/day: 0.30  . Years: 44.00  . Pack years: 13.20  . Types: Cigarettes  . Quit date: 03/26/2016  . Years since quitting: 3.4  Smokeless Tobacco Never Used    Allergies  Allergen Reactions  . Atorvastatin Other (See Comments)    Caused muscles in her back to be very sore  . Gabapentin Swelling    Legs and feet swell  . Contrast Media [Iodinated Diagnostic Agents] Rash  . Nicorette [Nicotine] Nausea Only   Objective:   There were no vitals filed for this visit. There is no height or weight on file to calculate BMI. Constitutional Well developed. Well nourished.  Vascular Dorsalis pedis pulses non-palpable bilaterally. Posterior tibial pulses non-palpable bilaterally. Capillary refill normal to all digits.  No cyanosis or clubbing noted. Pedal hair growth normal.  Neurologic Normal speech. Oriented to person, place, and time. Protective sensation absent  Dermatologic HPK R submet 5, submet 3; left submet 3, 5, heel  Orthopedic: No pain to palpation either foot.   Radiographs: None today Assessment:   1. DM type 2 with diabetic peripheral neuropathy ([place])   2. Porokeratosis    Plan:  Patient was evaluated and treated and all questions answered.  Ulcer Right  Foot -Pre-ulcerative lesion debrided with no evidence of open ulceration -Educated on signs of wound worsening  No follow-ups on file.

## 2019-05-17 ENCOUNTER — Other Ambulatory Visit (HOSPITAL_COMMUNITY): Payer: Self-pay | Admitting: *Deleted

## 2019-05-18 ENCOUNTER — Ambulatory Visit (HOSPITAL_COMMUNITY)
Admission: RE | Admit: 2019-05-18 | Discharge: 2019-05-18 | Disposition: A | Discharge status: Home / Self Care | Payer: Medicare Other | Source: Ambulatory Visit | Attending: Nephrology | Admitting: Nephrology

## 2019-05-18 ENCOUNTER — Other Ambulatory Visit: Payer: Self-pay

## 2019-05-18 VITALS — BP 118/54 | HR 79 | Temp 97.1°F | Resp 18 | Ht 65.0 in | Wt 110.0 lb

## 2019-05-18 DIAGNOSIS — D638 Anemia in other chronic diseases classified elsewhere: Secondary | ICD-10-CM | POA: Insufficient documentation

## 2019-05-18 MED ORDER — SODIUM CHLORIDE 0.9 % IV SOLN
510.0000 mg | INTRAVENOUS | Status: DC
  Filled 2019-05-18: qty 17

## 2019-05-18 MED ORDER — EPOETIN ALFA 10000 UNIT/ML IJ SOLN
INTRAMUSCULAR | Status: AC
  Administered 2019-05-18: 10000 [IU]
  Filled 2019-05-18: qty 1

## 2019-05-18 MED ORDER — EPOETIN ALFA 10000 UNIT/ML IJ SOLN: 10000.0000 [IU] | INTRAMUSCULAR | Status: DC

## 2019-05-19 LAB — POCT HEMOGLOBIN-HEMACUE: Hemoglobin: 8.9 g/dL — ABNORMAL LOW (ref 12.0–15.0)

## 2019-05-24 NOTE — Progress Notes (Signed)
  Subjective:  Patient ID: [name], female    DOB: [birth year],  MRN: [id number]  Chief Complaint  Patient presents with  . Nail Problem    pt is here for a nail and callus trim, pt states that she has been soaking her feet in epson salts, pt states that her right foot will often bleed as well.    69 y.o. female presents with the above complaint. History confirmed with patient.   Objective:  Physical Exam: warm, good capillary refill, nail exam onychomycosis of the toenails, no trophic changes or ulcerative lesions. DP pulses palpable and protective sensation intact HPK submet 5 bilat, left heel  No images are attached to the encounter.  Assessment:   1. DM type 2 with diabetic peripheral neuropathy ([place])   2. Porokeratosis   3. Onychomycosis      Plan:  Patient was evaluated and treated and all questions answered.  Diabetes and PAD -Patient is diabetic with a qualifying condition for at risk foot care. -R 5th MPJ pre-ulcerative - apply silvadene daily  Procedure: Nail Debridement Rationale: Patient meets criteria for routine foot care due to PAD Type of Debridement: manual, sharp debridement. Instrumentation: Nail nipper, rotary burr. Number of Nails: 10   Procedure: Paring of Lesion Rationale: painful hyperkeratotic lesion Type of Debridement: manual, sharp debridement. Instrumentation: 312 blade Number of Lesions: 3  Return in about 1 month (around 04/09/2019) for Wound Care, Right.

## 2019-05-26 ENCOUNTER — Inpatient Hospital Stay (HOSPITAL_COMMUNITY): Admission: RE | Admit: 2019-05-26 | Payer: Medicare Other | Source: Ambulatory Visit

## 2019-06-01 ENCOUNTER — Encounter (HOSPITAL_COMMUNITY)
Admission: RE | Admit: 2019-06-01 | Discharge: 2019-06-01 | Disposition: A | Discharge status: Home / Self Care | Payer: Medicare Other | Source: Ambulatory Visit | Attending: Nephrology | Admitting: Nephrology

## 2019-06-01 ENCOUNTER — Other Ambulatory Visit: Payer: Self-pay

## 2019-06-01 DIAGNOSIS — D638 Anemia in other chronic diseases classified elsewhere: Secondary | ICD-10-CM

## 2019-06-01 DIAGNOSIS — N183 Chronic kidney disease, stage 3 unspecified: Secondary | ICD-10-CM | POA: Diagnosis not present

## 2019-06-01 DIAGNOSIS — D631 Anemia in chronic kidney disease: Secondary | ICD-10-CM | POA: Insufficient documentation

## 2019-06-01 LAB — IRON AND TIBC
Iron: 50 ug/dL (ref 28–170)
Saturation Ratios: 34 % — ABNORMAL HIGH (ref 10.4–31.8)
TIBC: 146 ug/dL — ABNORMAL LOW (ref 250–450)
UIBC: 96 ug/dL

## 2019-06-01 LAB — POCT HEMOGLOBIN-HEMACUE: Hemoglobin: 10.1 g/dL — ABNORMAL LOW (ref 12.0–15.0)

## 2019-06-01 LAB — FERRITIN: Ferritin: 132 ng/mL (ref 11–307)

## 2019-06-01 MED ORDER — SODIUM CHLORIDE 0.9 % IV SOLN
510.0000 mg | INTRAVENOUS | Status: DC
  Administered 2019-06-01: 14:00:00 510 mg via INTRAVENOUS
  Filled 2019-06-01: qty 17

## 2019-06-01 MED ORDER — EPOETIN ALFA 10000 UNIT/ML IJ SOLN
10000.0000 [IU] | INTRAMUSCULAR | Status: DC
  Administered 2019-06-01: 10000 [IU] via SUBCUTANEOUS

## 2019-06-01 MED ORDER — EPOETIN ALFA 10000 UNIT/ML IJ SOLN
INTRAMUSCULAR | Status: AC
  Filled 2019-06-01: qty 1

## 2019-06-02 ENCOUNTER — Encounter (HOSPITAL_COMMUNITY): Payer: Medicare Other

## 2019-06-08 ENCOUNTER — Other Ambulatory Visit: Payer: Self-pay | Admitting: Internal Medicine

## 2019-06-15 ENCOUNTER — Other Ambulatory Visit: Payer: Self-pay

## 2019-06-15 ENCOUNTER — Encounter (HOSPITAL_COMMUNITY)
Admission: RE | Admit: 2019-06-15 | Discharge: 2019-06-15 | Disposition: A | Discharge status: Home / Self Care | Payer: Medicare Other | Source: Ambulatory Visit | Attending: Nephrology | Admitting: Nephrology

## 2019-06-15 VITALS — BP 107/55 | HR 67 | Temp 96.7°F | Resp 18

## 2019-06-15 DIAGNOSIS — D638 Anemia in other chronic diseases classified elsewhere: Secondary | ICD-10-CM

## 2019-06-15 DIAGNOSIS — N183 Chronic kidney disease, stage 3 unspecified: Secondary | ICD-10-CM | POA: Diagnosis not present

## 2019-06-15 LAB — POCT HEMOGLOBIN-HEMACUE: Hemoglobin: 9.9 g/dL — ABNORMAL LOW (ref 12.0–15.0)

## 2019-06-15 MED ORDER — EPOETIN ALFA 10000 UNIT/ML IJ SOLN
INTRAMUSCULAR | Status: AC
  Administered 2019-06-15: 13:00:00 10000 [IU]
  Filled 2019-06-15: qty 1

## 2019-06-15 MED ORDER — SODIUM CHLORIDE 0.9 % IV SOLN
510.0000 mg | Freq: Once | INTRAVENOUS | Status: AC
  Administered 2019-06-15: 510 mg via INTRAVENOUS
  Filled 2019-06-15: qty 17

## 2019-06-15 MED ORDER — EPOETIN ALFA 10000 UNIT/ML IJ SOLN: 10000.0000 [IU] | INTRAMUSCULAR | Status: DC

## 2019-06-29 ENCOUNTER — Other Ambulatory Visit: Payer: Self-pay

## 2019-06-29 ENCOUNTER — Ambulatory Visit (HOSPITAL_COMMUNITY)
Admission: RE | Admit: 2019-06-29 | Discharge: 2019-06-29 | Disposition: A | Discharge status: Home / Self Care | Payer: Medicare Other | Source: Ambulatory Visit | Attending: Nephrology | Admitting: Nephrology

## 2019-06-29 VITALS — BP 105/59 | HR 94 | Temp 96.7°F | Resp 18

## 2019-06-29 DIAGNOSIS — D638 Anemia in other chronic diseases classified elsewhere: Secondary | ICD-10-CM | POA: Insufficient documentation

## 2019-06-29 LAB — POCT HEMOGLOBIN-HEMACUE: Hemoglobin: 10.4 g/dL — ABNORMAL LOW (ref 12.0–15.0)

## 2019-06-29 LAB — IRON AND TIBC: Iron: 53 ug/dL (ref 28–170)

## 2019-06-29 LAB — FERRITIN: Ferritin: 456 ng/mL — ABNORMAL HIGH (ref 11–307)

## 2019-06-29 MED ORDER — EPOETIN ALFA 10000 UNIT/ML IJ SOLN: 10000.0000 [IU] | INTRAMUSCULAR | Status: DC

## 2019-06-29 MED ORDER — EPOETIN ALFA 10000 UNIT/ML IJ SOLN
INTRAMUSCULAR | Status: AC
  Administered 2019-06-29: 10000 [IU] via SUBCUTANEOUS
  Filled 2019-06-29: qty 1

## 2019-07-08 ENCOUNTER — Other Ambulatory Visit: Payer: Self-pay | Admitting: Internal Medicine

## 2019-07-13 ENCOUNTER — Encounter (HOSPITAL_COMMUNITY)
Admission: RE | Admit: 2019-07-13 | Discharge: 2019-07-13 | Disposition: A | Discharge status: Home / Self Care | Payer: Medicare Other | Source: Ambulatory Visit | Attending: Nephrology | Admitting: Nephrology

## 2019-07-13 ENCOUNTER — Other Ambulatory Visit: Payer: Self-pay

## 2019-07-13 VITALS — BP 99/55 | HR 92 | Temp 96.5°F | Resp 18

## 2019-07-13 DIAGNOSIS — D638 Anemia in other chronic diseases classified elsewhere: Secondary | ICD-10-CM

## 2019-07-13 LAB — POCT HEMOGLOBIN-HEMACUE: Hemoglobin: 10.7 g/dL — ABNORMAL LOW (ref 12.0–15.0)

## 2019-07-13 MED ORDER — EPOETIN ALFA 10000 UNIT/ML IJ SOLN: 10000.0000 [IU] | INTRAMUSCULAR | Status: DC

## 2019-07-13 MED ORDER — EPOETIN ALFA 10000 UNIT/ML IJ SOLN
INTRAMUSCULAR | Status: AC
  Administered 2019-07-13: 10000 [IU]
  Filled 2019-07-13: qty 1

## 2019-07-27 ENCOUNTER — Encounter (HOSPITAL_COMMUNITY): Payer: Medicare Other

## 2019-08-01 ENCOUNTER — Encounter (HOSPITAL_COMMUNITY): Payer: Medicare Other

## 2019-08-10 ENCOUNTER — Encounter (HOSPITAL_COMMUNITY): Payer: Medicare Other

## 2019-08-11 ENCOUNTER — Inpatient Hospital Stay (HOSPITAL_COMMUNITY): Admission: RE | Admit: 2019-08-11 | Payer: Medicare Other | Source: Ambulatory Visit

## 2019-08-15 ENCOUNTER — Encounter (HOSPITAL_COMMUNITY): Payer: Medicare Other

## 2019-08-25 ENCOUNTER — Encounter (HOSPITAL_COMMUNITY): Payer: Medicare Other

## 2019-09-06 NOTE — Progress Notes (Signed)
Subjective:  Patient ID: [name], female    DOB: [birth year],  MRN: [id number]  Chief Complaint  Patient presents with  . Foot Ulcer    pt is here for an ulcer check , pt states that she is in a lot of pain, and has not been taking lyrica    69 y.o. female presents with the above complaint. Hx as above.  Review of Systems: Negative except as noted in the HPI. Denies N/V/F/Ch.  Past Medical History:  Diagnosis Date  . ANEMIA, CHRONIC DISEASE NEC 03/15/2006  . Carotid artery occlusion   . Cirrhosis ([place])   . Colon polyps   . Constipation due to opioid therapy 06/05/2014  . DEGENERATIVE JOINT DISEASE 05/06/2007   On going for >5 yrs No imaging to confirm Has tried ultram, mobic, neurontin which did not work Ambulance person tried once worked well    . Diabetes mellitus    Type II  . DIABETIC  RETINOPATHY 03/15/2006  . 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 Diastolic dysfunction 63/87/5643   Grade 1 by Echocardiogram 12/13/14  . Elevated liver enzymes   . Heart murmur   . Hemorrhoids   . History of kidney stones   . Hyperlipidemia 04/01/2011  . Hypertension   . Mild AI (aortic insufficiency)   . Moderate aortic regurgitation 09/25/2008   12/13/14 Echocardiogram    . Myalgia   . PAD (peripheral artery disease) ([place])   . PANCREATITIS, CHRONIC 03/29/2006  . Peripheral arterial disease ([place])   . Pneumonia    age 80-21  . RENAL INSUFFICIENCY, CHRONIC 03/15/2006  . Tonsillar mass    Left    Current Outpatient Medications:  .  ACCU-CHEK AVIVA PLUS test strip, TEST three times a day, Disp: 100 each, Rfl: 11 .  amLODipine (NORVASC) 10 MG tablet, Take 10 mg by mouth daily., Disp: , Rfl:  .  aspirin EC 81 MG tablet, Take 1 tablet (81 mg total) by mouth daily., Disp: , Rfl:  .  Blood Glucose Monitoring Suppl (ACCU-CHEK AVIVA PLUS) w/Device KIT, Use to check you blood sugar 3 times a day, Disp: 1 kit, Rfl: 1 .  LINZESS 145 MCG CAPS capsule, Take 145 mcg by mouth daily. , Disp: , Rfl:  .   Pancrelipase, Lip-Prot-Amyl, 24000-76000 units CPEP, Take 4 capsules (96,000 Units total) by mouth See admin instructions. Take 4 capsules by mouth 2-3 times daily before meals (Patient taking differently: Take 2-4 capsules by mouth See admin instructions. Take 4 capsules by mouth two to three times a day with meals and 2 capsules with any snacks), Disp: 1170 capsule, Rfl: 3 .  pregabalin (LYRICA) 75 MG capsule, Take 1 capsule (75 mg total) by mouth daily., Disp: 60 capsule, Rfl: 0 .  PROAIR HFA 108 (90 Base) MCG/ACT inhaler, Inhale 2 puffs into the lungs 4 (four) times daily as needed for wheezing or shortness of breath., Disp: , Rfl: 3 .  sucralfate (CARAFATE) 1 g tablet, Take 1 tablet (1 g total) by mouth 3 (three) times daily after meals., Disp: 60 tablet, Rfl: 0 .  TRESIBA FLEXTOUCH 100 UNIT/ML SOPN FlexTouch Pen, Inject 20 Units into the skin [name] morning. , Disp: , Rfl:  .  dexamethasone (DECADRON) 4 MG tablet, Take 2 tablets (8 mg total) by mouth daily. Start the day after chemotherapy for 2 days. Take with food., Disp: 30 tablet, Rfl: 1 .  glipiZIDE (GLUCOTROL XL) 2.5 MG 24 hr tablet, Take 2.5 mg by mouth [name] morning., Disp: ,  Rfl:  .  lidocaine-prilocaine (EMLA) cream, Apply to affected area once, Disp: 30 g, Rfl: 3 .  meclizine (ANTIVERT) 25 MG tablet, Take 1 tablet (25 mg total) by mouth 3 (three) times daily as needed for dizziness., Disp: 30 tablet, Rfl: 0 .  Olopatadine HCl 0.2 % SOLN, , Disp: , Rfl:  .  omeprazole (PRILOSEC) 40 MG capsule, Take 1 capsule (40 mg total) by mouth daily., Disp: 90 capsule, Rfl: 0 .  ondansetron (ZOFRAN) 8 MG tablet, Take 1 tablet (8 mg total) by mouth 2 (two) times daily as needed for refractory nausea / vomiting. Start on day 3 after chemotherapy., Disp: 30 tablet, Rfl: 1 .  PAZEO 0.7 % SOLN, Place 1 drop into both eyes at bedtime. , Disp: , Rfl:  .  prochlorperazine (COMPAZINE) 10 MG tablet, Take 1 tablet (10 mg total) by mouth [name] 6 (six) hours as  needed (Nausea or vomiting)., Disp: 30 tablet, Rfl: 1 .  rosuvastatin (CRESTOR) 20 MG tablet, Take 1 tablet (20 mg total) by mouth daily., Disp: 90 tablet, Rfl: 1 .  silver sulfADIAZINE (SILVADENE) 1 % cream, Apply pea-sized amount to wound daily. (Patient taking differently: Apply 1 application topically daily as needed (wound care). ), Disp: 50 g, Rfl: 0 .  sodium chloride (MURO 128) 5 % ophthalmic solution, Place 1 drop into both eyes at bedtime. , Disp: , Rfl:  .  XARELTO 2.5 MG TABS tablet, TAKE 1 TABLET(2.5 MG) BY MOUTH TWICE DAILY, Disp: 180 tablet, Rfl: 1  Social History   Tobacco Use  Smoking Status Former Smoker  . Packs/day: 0.30  . Years: 44.00  . Pack years: 13.20  . Types: Cigarettes  . Quit date: 03/26/2016  . Years since quitting: 3.4  Smokeless Tobacco Never Used    Allergies  Allergen Reactions  . Atorvastatin Other (See Comments)    Caused muscles in her back to be very sore  . Gabapentin Swelling    Legs and feet swell  . Contrast Media [Iodinated Diagnostic Agents] Rash  . Nicorette [Nicotine] Nausea Only   Objective:   Vitals:   09/08/18 0959  Temp: (!) 97.3 F (36.3 C)   There is no height or weight on file to calculate BMI. Constitutional Well developed. Well nourished.  Vascular Dorsalis pedis pulses palpable bilaterally. Posterior tibial pulses non-palpable bilaterally. Capillary refill normal to all digits.  No cyanosis or clubbing noted. Pedal hair growth normal.  Neurologic Normal speech. Oriented to person, place, and time. Epicritic sensation to light touch grossly present bilaterally.  Dermatologic Nails well groomed and normal in appearance. No skin lesions. Would R 5th MPJ  0.5 x 0.5 post-debridement. Punctate keratoses sub-met 1 5 heel left, first MPJ right  Orthopedic: Normal joint ROM without pain or crepitus bilaterally. No visible deformities. Pain on palpation right fifth MPJ   Radiographs: None today Assessment:   No  diagnosis found. Plan:  Patient was evaluated and treated and all questions answered.  Ulcer R 5th MPJ -Still pending surgery. -Ulcer debrided as below -Continue offloading inserts.  Procedure: Excisional Debridement of Wound Rationale: Removal of non-viable soft tissue from the wound to promote healing.  Anesthesia: none Pre-Debridement Wound Measurements: overlying hyperkeratosis Post-Debridement Wound Measurements: 0.5x0.5x0.2 Type of Debridement: Sharp Excisional Tissue Removed: Non-viable soft tissue Depth of Debridement: subcutaneous tissue. Technique: Sharp excisional debridement to bleeding, viable wound base.  Dressing: Dry, sterile, compression dressing. Disposition: Patient tolerated procedure well. Patient to return in 1 week for follow-up.  Return in  about 2 weeks (around 09/22/2018) for Wound Care, Right.

## 2019-09-24 DEATH — deceased

## 2019-10-06 ENCOUNTER — Other Ambulatory Visit: Payer: Self-pay | Admitting: Cardiology

## 2019-10-06 ENCOUNTER — Other Ambulatory Visit: Payer: Self-pay | Admitting: Internal Medicine
